# Patient Record
Sex: Male | Born: 1950 | Race: White | Hispanic: No | Marital: Married | Smoking: Never smoker
Health system: Southern US, Community
[De-identification: ages and names within clinical notes are randomized; demographics above are authoritative.]

## PROBLEM LIST (undated history)

## (undated) DIAGNOSIS — C801 Malignant (primary) neoplasm, unspecified: Secondary | ICD-10-CM

## (undated) DIAGNOSIS — H269 Unspecified cataract: Secondary | ICD-10-CM

## (undated) DIAGNOSIS — G709 Myoneural disorder, unspecified: Secondary | ICD-10-CM

## (undated) DIAGNOSIS — M1990 Unspecified osteoarthritis, unspecified site: Secondary | ICD-10-CM

## (undated) DIAGNOSIS — J189 Pneumonia, unspecified organism: Secondary | ICD-10-CM

## (undated) DIAGNOSIS — Z973 Presence of spectacles and contact lenses: Secondary | ICD-10-CM

## (undated) DIAGNOSIS — G4730 Sleep apnea, unspecified: Secondary | ICD-10-CM

## (undated) DIAGNOSIS — E538 Deficiency of other specified B group vitamins: Secondary | ICD-10-CM

## (undated) DIAGNOSIS — E039 Hypothyroidism, unspecified: Secondary | ICD-10-CM

## (undated) DIAGNOSIS — Z87820 Personal history of traumatic brain injury: Secondary | ICD-10-CM

## (undated) DIAGNOSIS — K5909 Other constipation: Secondary | ICD-10-CM

## (undated) DIAGNOSIS — E559 Vitamin D deficiency, unspecified: Secondary | ICD-10-CM

## (undated) DIAGNOSIS — G20A1 Parkinson's disease without dyskinesia, without mention of fluctuations: Secondary | ICD-10-CM

## (undated) DIAGNOSIS — R269 Unspecified abnormalities of gait and mobility: Secondary | ICD-10-CM

## (undated) DIAGNOSIS — T7840XA Allergy, unspecified, initial encounter: Secondary | ICD-10-CM

## (undated) DIAGNOSIS — K219 Gastro-esophageal reflux disease without esophagitis: Secondary | ICD-10-CM

## (undated) DIAGNOSIS — J342 Deviated nasal septum: Secondary | ICD-10-CM

## (undated) DIAGNOSIS — I10 Essential (primary) hypertension: Secondary | ICD-10-CM

## (undated) DIAGNOSIS — G4733 Obstructive sleep apnea (adult) (pediatric): Secondary | ICD-10-CM

## (undated) DIAGNOSIS — M25512 Pain in left shoulder: Secondary | ICD-10-CM

## (undated) DIAGNOSIS — J309 Allergic rhinitis, unspecified: Secondary | ICD-10-CM

## (undated) DIAGNOSIS — G20 Parkinson's disease: Secondary | ICD-10-CM

## (undated) HISTORY — PX: OTHER SURGICAL HISTORY: SHX169

## (undated) HISTORY — PX: POSTERIOR FUSION CERVICAL SPINE: SUR628

## (undated) HISTORY — PX: POSTERIOR LAMINECTOMY / DECOMPRESSION LUMBAR SPINE: SUR740

## (undated) HISTORY — PX: TOTAL HIP ARTHROPLASTY: SHX124

## (undated) HISTORY — PX: LAPAROSCOPIC CHOLECYSTECTOMY: SUR755

## (undated) HISTORY — DX: Unspecified cataract: H26.9

## (undated) HISTORY — DX: Myoneural disorder, unspecified: G70.9

## (undated) HISTORY — DX: Allergy, unspecified, initial encounter: T78.40XA

## (undated) HISTORY — PX: CHOLECYSTECTOMY: SHX55

## (undated) HISTORY — PX: SPINE SURGERY: SHX786

## (undated) HISTORY — PX: JOINT REPLACEMENT: SHX530

## (undated) HISTORY — PX: POSTERIOR CERVICAL FUSION/FORAMINOTOMY: SHX5038

## (undated) HISTORY — PX: APPENDECTOMY: SHX54

## (undated) HISTORY — DX: Sleep apnea, unspecified: G47.30

---
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
## 2017-09-18 HISTORY — PX: TOTAL HIP ARTHROPLASTY: SHX124

## 2018-05-23 ENCOUNTER — Other Ambulatory Visit: Payer: Self-pay

## 2018-05-23 ENCOUNTER — Emergency Department (HOSPITAL_COMMUNITY): Payer: Medicare Other

## 2018-05-23 ENCOUNTER — Emergency Department (HOSPITAL_COMMUNITY)
Admission: EM | Admit: 2018-05-23 | Discharge: 2018-05-23 | Disposition: A | Discharge status: Home / Self Care | Payer: Medicare Other | Attending: Emergency Medicine | Admitting: Emergency Medicine

## 2018-05-23 ENCOUNTER — Encounter (HOSPITAL_COMMUNITY): Payer: Self-pay

## 2018-05-23 DIAGNOSIS — S73005A Unspecified dislocation of left hip, initial encounter: Secondary | ICD-10-CM | POA: Diagnosis not present

## 2018-05-23 DIAGNOSIS — X500XXA Overexertion from strenuous movement or load, initial encounter: Secondary | ICD-10-CM | POA: Insufficient documentation

## 2018-05-23 DIAGNOSIS — Y9389 Activity, other specified: Secondary | ICD-10-CM | POA: Insufficient documentation

## 2018-05-23 DIAGNOSIS — Y92009 Unspecified place in unspecified non-institutional (private) residence as the place of occurrence of the external cause: Secondary | ICD-10-CM | POA: Insufficient documentation

## 2018-05-23 DIAGNOSIS — I10 Essential (primary) hypertension: Secondary | ICD-10-CM | POA: Diagnosis not present

## 2018-05-23 DIAGNOSIS — M79605 Pain in left leg: Secondary | ICD-10-CM | POA: Diagnosis not present

## 2018-05-23 DIAGNOSIS — S79912A Unspecified injury of left hip, initial encounter: Secondary | ICD-10-CM | POA: Diagnosis present

## 2018-05-23 DIAGNOSIS — Z9889 Other specified postprocedural states: Secondary | ICD-10-CM

## 2018-05-23 DIAGNOSIS — Y998 Other external cause status: Secondary | ICD-10-CM | POA: Insufficient documentation

## 2018-05-23 DIAGNOSIS — Z96642 Presence of left artificial hip joint: Secondary | ICD-10-CM | POA: Diagnosis not present

## 2018-05-23 LAB — CBC WITH DIFFERENTIAL/PLATELET
Abs Immature Granulocytes: 0.03 10*3/uL (ref 0.00–0.07)
Basophils Absolute: 0 10*3/uL (ref 0.0–0.1)
Basophils Relative: 1 %
Eosinophils Absolute: 0.2 10*3/uL (ref 0.0–0.5)
Eosinophils Relative: 4 %
HCT: 35.3 % — ABNORMAL LOW (ref 39.0–52.0)
Hemoglobin: 11.4 g/dL — ABNORMAL LOW (ref 13.0–17.0)
Immature Granulocytes: 1 %
Lymphocytes Relative: 20 %
Lymphs Abs: 1.1 10*3/uL (ref 0.7–4.0)
MCH: 29.8 pg (ref 26.0–34.0)
MCHC: 32.3 g/dL (ref 30.0–36.0)
MCV: 92.2 fL (ref 80.0–100.0)
Monocytes Absolute: 0.5 10*3/uL (ref 0.1–1.0)
Monocytes Relative: 9 %
Neutro Abs: 3.8 10*3/uL (ref 1.7–7.7)
Neutrophils Relative %: 65 %
Platelets: 119 10*3/uL — ABNORMAL LOW (ref 150–400)
RBC: 3.83 MIL/uL — ABNORMAL LOW (ref 4.22–5.81)
RDW: 12.9 % (ref 11.5–15.5)
WBC: 5.7 10*3/uL (ref 4.0–10.5)
nRBC: 0 % (ref 0.0–0.2)

## 2018-05-23 LAB — BASIC METABOLIC PANEL
Anion gap: 6 (ref 5–15)
BUN: 11 mg/dL (ref 8–23)
CO2: 24 mmol/L (ref 22–32)
Calcium: 7.5 mg/dL — ABNORMAL LOW (ref 8.9–10.3)
Chloride: 111 mmol/L (ref 98–111)
Creatinine, Ser: 0.66 mg/dL (ref 0.61–1.24)
GFR calc Af Amer: >60 mL/min (ref >60)
GFR calc non Af Amer: >60 mL/min (ref >60)
Glucose, Bld: 87 mg/dL (ref 70–99)
Potassium: 3 mmol/L — ABNORMAL LOW (ref 3.5–5.1)
Sodium: 141 mmol/L (ref 135–145)

## 2018-05-23 LAB — I-STAT CHEM 8, ED
BUN: 8 mg/dL (ref 8–23)
Calcium, Ion: 0.96 mmol/L — ABNORMAL LOW (ref 1.15–1.40)
Chloride: 110 mmol/L (ref 98–111)
Creatinine, Ser: 0.6 mg/dL — ABNORMAL LOW (ref 0.61–1.24)
Glucose, Bld: 77 mg/dL (ref 70–99)
HCT: 30 % — ABNORMAL LOW (ref 39.0–52.0)
Hemoglobin: 10.2 g/dL — ABNORMAL LOW (ref 13.0–17.0)
Potassium: 2.8 mmol/L — ABNORMAL LOW (ref 3.5–5.1)
Sodium: 141 mmol/L (ref 135–145)
TCO2: 23 mmol/L (ref 22–32)

## 2018-05-23 LAB — PROTIME-INR
INR: 0.94
Prothrombin Time: 12.5 seconds (ref 11.4–15.2)

## 2018-05-23 MED ORDER — TRAMADOL HCL 50 MG PO TABS: 50.0000 mg | ORAL_TABLET | Freq: Four times a day (QID) | ORAL | 0 refills | Status: DC | PRN

## 2018-05-23 MED ORDER — FENTANYL CITRATE (PF) 100 MCG/2ML IJ SOLN
50.0000 ug | INTRAMUSCULAR | Status: AC | PRN
  Administered 2018-05-23 (×2): 50 ug via INTRAVENOUS
  Filled 2018-05-23 (×2): qty 2

## 2018-05-23 MED ORDER — ONDANSETRON HCL 4 MG/2ML IJ SOLN
4.0000 mg | Freq: Once | INTRAMUSCULAR | Status: AC
  Administered 2018-05-23: 4 mg via INTRAVENOUS
  Filled 2018-05-23: qty 2

## 2018-05-23 MED ORDER — PROPOFOL 10 MG/ML IV BOLUS
0.5000 mg/kg | Freq: Once | INTRAVENOUS | Status: AC
  Administered 2018-05-23: 51.1 mg via INTRAVENOUS
  Filled 2018-05-23: qty 20

## 2018-05-23 NOTE — ED Provider Notes (Signed)
.  Sedation Date/Time: 05/23/2018 11:29 PM Performed by: Deno Etienne, DO Authorized by: Deno Etienne, DO   Consent:    Consent obtained:  Verbal   Consent given by:  Patient and spouse   Risks discussed:  Allergic reaction, inadequate sedation, nausea, vomiting, respiratory compromise necessitating ventilatory assistance and intubation and prolonged hypoxia resulting in organ damage   Alternatives discussed:  Analgesia without sedation, anxiolysis and regional anesthesia Universal protocol:    Procedure explained and questions answered to patient or proxy's satisfaction: yes     Immediately prior to procedure a time out was called: yes     Patient identity confirmation method:  Arm band and verbally with patient Indications:    Procedure performed:  Dislocation reduction   Procedure necessitating sedation performed by:  Different physician Pre-sedation assessment:    Time since last food or drink:  6   ASA classification: class 2 - patient with mild systemic disease     Neck mobility: reduced (prior spinal fusion)     Mouth opening:  3 or more finger widths   Thyromental distance:  2 finger widths   Mallampati score:  II - soft palate, uvula, fauces visible   Pre-sedation assessments completed and reviewed: airway patency, cardiovascular function, hydration status, mental status, nausea/vomiting, pain level, respiratory function and temperature   Immediate pre-procedure details:    Reviewed: vital signs, relevant labs/tests and NPO status     Verified: bag valve mask available, emergency equipment available, intubation equipment available, IV patency confirmed, oxygen available and suction available   Procedure details (see MAR for exact dosages):    Preoxygenation:  Nasal cannula   Sedation:  Propofol   Analgesia:  Fentanyl   Intra-procedure monitoring:  Blood pressure monitoring, cardiac monitor, continuous capnometry, continuous pulse oximetry, frequent LOC assessments and frequent  vital sign checks   Intra-procedure events: none     Total Provider sedation time (minutes):  35 Post-procedure details:    Post-sedation assessments completed and reviewed: airway patency, cardiovascular function, hydration status, mental status, nausea/vomiting, pain level, respiratory function and temperature     Patient is stable for discharge or admission: yes     Patient tolerance:  Tolerated well, no immediate complications   Medical screening examination/treatment/procedure(s) were conducted as a shared visit with non-physician practitioner(s) and myself.  I personally evaluated the patient during the encounter.  None   See the written copy of this report in the patient's paper medical record.  These results did not interface directly into the electronic medical record and are summarized here.   67 yo M with a chief complaint of a left hip dislocation.  He was squatting and fell out of place.  Plain films viewed by me with superior dislocation.  This was reduced with conscious sedation.  My exam initially with deformity to the left hip pulse motor and sensation intact distally.  Placed in a knee immobilizer.  Follow-up with orthopedics.    Deno Etienne, DO 05/23/18 2332

## 2018-05-23 NOTE — ED Notes (Addendum)
Procedural sedation: Dr Tyrone Nine, PA Vernie Shanks, Ortho tech [place], RT [place]ang, RN Vue [place] time out 1958 propofol 50mg   Given by Dr Tyrone Nine 1959 propofol 20mg   Given by Dr Tyrone Nine 2000 propofol 10mg  Given by Dr Tyrone Nine 2000 propofol 30mg  Given by Dr Tyrone Nine 2004 propofol 20mg  Given by Dr Tyrone Nine 2005 sedation finished. 2015 pt is awake, alert and oriented x 4, VSS.

## 2018-05-23 NOTE — ED Provider Notes (Signed)
[facility] DEPT Provider Note   CSN: [id number] Arrival date & time: 05/23/18  1614     History   Chief Complaint Chief Complaint  Patient presents with  . Hip Injury    HPI Jim Carson is a 67 y.o. male with a past medical history of previous total hip arthroplasty on the left and hypertension.  He arrives with hip injury.  Patient states that he was in his garage squatting down to look for Christmas decorations when he felt like there was severe of ear pulling on the muscles of his left hip and then he felt like his hip just "slid right out."  His surgery was done in the past at [facility] in [place].  He was unable to move or ambulate after the episode today.  He has no history of previous dislocations of the prosthesis or injuries.  He denies any numbness or tingling of the foot  HPI  History reviewed. No pertinent past medical history.  There are no active problems to display for this patient.   Past Surgical History:  Procedure Laterality Date  . TOTAL HIP ARTHROPLASTY Left 09/18/2017        Home Medications    Prior to Admission medications   Not on File    Family History History reviewed. No pertinent family history.  Social History Social History   Tobacco Use  . Smoking status: Never Smoker  . Smokeless tobacco: Never Used  Substance Use Topics  . Alcohol use: Not on file  . Drug use: Not on file     Allergies   Patient has no allergy information on record.   Review of Systems Review of Systems Ten systems reviewed and are negative for acute change, except as noted in the HPI.    Physical Exam Updated Vital Signs BP 123/65   Pulse (!) 58   Temp 98.1 F (36.7 C) (Oral)   Resp 16   Ht 5\' 8"  (1.727 m)   Wt 102.1 kg   SpO2 98%   BMI 34.21 kg/m   Physical Exam  Constitutional: He appears well-developed and well-nourished. No distress.  HENT:  Head: Normocephalic and atraumatic.  Eyes: Conjunctivae are  normal. No scleral icterus.  Neck: Normal range of motion. Neck supple.  Cardiovascular: Normal rate, regular rhythm and normal heart sounds.  Pulmonary/Chest: Effort normal and breath sounds normal. No respiratory distress.  Abdominal: Soft. There is no tenderness.  Musculoskeletal: He exhibits deformity.  Obvious right-sided hip deformity.  Hip appears wide.  There is mild internal rotation at the ankle with a small amount of shortening of the limb.  Neurological: He is alert.  Skin: Skin is warm and dry. He is not diaphoretic.  Psychiatric: His behavior is normal.  Nursing note and vitals reviewed.    ED Treatments / Results  Labs (all labs ordered are listed, but only abnormal results are displayed) Labs Reviewed  BASIC METABOLIC PANEL  CBC WITH DIFFERENTIAL/PLATELET  PROTIME-INR  I-STAT CHEM 8, ED    EKG None  Radiology No results found.  Procedures Reduction of dislocation Date/Time: 05/23/2018 11:17 PM Performed by: Margarita Mail, PA-C Authorized by: Margarita Mail, PA-C  Consent: Verbal consent obtained. Consent given by: patient Patient identity confirmed: verbally with patient and provided demographic data Time out: Immediately prior to procedure a "time out" was called to verify the correct patient, procedure, equipment, support staff and site/side marked as required.  Sedation: Patient sedated: yes (see Dr. Miachel Roux note) Vitals: Vital  signs were monitored during sedation.  Patient tolerance: Patient tolerated the procedure well with no immediate complications    (including critical care time)  Medications Ordered in ED Medications  fentaNYL (SUBLIMAZE) injection 50 mcg (has no administration in time range)  ondansetron (ZOFRAN) injection 4 mg (has no administration in time range)     Initial Impression / Assessment and Plan / ED Course  I have reviewed the triage vital signs and the nursing notes.  Pertinent labs & imaging results that were  available during my care of the patient were reviewed by me and considered in my medical decision making (see chart for details).  Clinical Course as of May 23 1826  Sat May 23, 2018  1748 Potassium(!): 2.8 [AH]    Clinical Course User Index [AH] Margarita Mail, PA-C    Patient with prosthetic hip dislocation.  Procedural sedation provided by Dr. Tyrone Nine. Patient had successful reduction of the dislocation.  He has a walker at home and patient was placed in a knee immobilizer.  He had his surgery done in [facility] and is advised to follow with that surgeon however I gave him the number for the doctor who is currently on-call for [place] [facility].  Patient appears appropriate for discharge at this time.  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

## 2018-05-23 NOTE — ED Notes (Signed)
Bed: WA04 Expected date:  Expected time:  Means of arrival:  Comments: Hip injury

## 2018-05-23 NOTE — Progress Notes (Signed)
RT present in room during conscious sedation.  Pt tolerated well on 2lnc.  Vitals stable, wnl during procedure.

## 2018-05-23 NOTE — Discharge Instructions (Addendum)
Please keep the knee immobilizer on at all times until you follow up with the orthpedic surgeon and are instructed to discontinue using it.   Follow up with your orthopedic surgeon. Contact a health care provider if: You have pain that gets worse or does not get better with medicine. You have swelling or red skin on your hip area or your leg. You cannot move any part of your hip or leg. You have a tingling sensation in any part of your hip, leg, or foot. Get help right away if: You feel like your hip has dislocated again. You have severe pain in your hip or groin. You have numbness or weakness in your leg.

## 2018-08-27 ENCOUNTER — Encounter: Payer: Self-pay | Admitting: Physical Therapy

## 2018-08-27 ENCOUNTER — Ambulatory Visit: Payer: Medicare Other | Attending: Neurology | Admitting: Physical Therapy

## 2018-08-27 DIAGNOSIS — R2689 Other abnormalities of gait and mobility: Secondary | ICD-10-CM | POA: Diagnosis not present

## 2018-08-27 DIAGNOSIS — R2681 Unsteadiness on feet: Secondary | ICD-10-CM | POA: Insufficient documentation

## 2018-08-27 DIAGNOSIS — R293 Abnormal posture: Secondary | ICD-10-CM | POA: Insufficient documentation

## 2018-08-27 DIAGNOSIS — M6281 Muscle weakness (generalized): Secondary | ICD-10-CM | POA: Diagnosis present

## 2018-08-27 NOTE — Therapy (Signed)
[facility] [address] [place], [place], [address] Phone: [phone]   Fax:  [phone]  Physical Therapy Evaluation  Patient Details  Name: Jim Carson MRN: [id number] Date of Birth: 03/30/1951 Referring Provider (PT): Jennelle Human, MD   Encounter Date: 08/27/2018  PT End of Session - 08/27/18 1307    Visit Number  1    Number of Visits  13    Date for PT Re-Evaluation  10/26/18    Authorization Type  Medicare and [employer] (covered 100%)-will need 10th visit progress notes    PT Start Time  0934    PT Stop Time  1038    PT Time Calculation (min)  64 min    Equipment Utilized During Treatment  Gait belt    Activity Tolerance  Patient tolerated treatment well    Behavior During Therapy  [facility] for tasks assessed/performed       History reviewed. No pertinent past medical history.  Past Surgical History:  Procedure Laterality Date  . 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   Subjective Assessment - 08/27/18 0937    Subjective  Saw Dr. Nicki Reaper and he recommended that I get some therapy.  My balance this past year has not been as good.  Was golfing, doing TaiChi and [employer], but then moved and had hip replacement.  Have had at least 1 fall in the past 6 months.  Uses cane.    Pertinent History  08/2017 L THR; 05/23/18 L hip dislocation (was put back in without surgery), L RTC tear    Patient Stated Goals  Pt's goal for therapy is to improve balance and get back to where I was one year ago.     Currently in Pain?  Yes    Pain Score  1     Pain Location  Shoulder    Pain Orientation  Left    Pain Descriptors / Indicators  Aching    Pain Type  Chronic pain    Pain Onset  More than a month ago    Pain Frequency  Intermittent    Aggravating Factors   driving car, holding onto treadmill    Pain Relieving Factors  resting position         [facility] PT  Assessment - 08/27/18 0944      Assessment   Medical Diagnosis  Parkinson's disease    Referring Provider (PT)  Jennelle Human, MD    Onset Date/Surgical Date  --   08/2017 L hip surgery   Hand Dominance  Left    Next MD Visit  to [place] 08/2017      Precautions   Precautions  Fall      Balance Screen   Has the patient fallen in the past 6 months  Yes    How many times?  2    Has the patient had a decrease in activity level because of a fear of falling?   No    Is the patient reluctant to leave their home because of a fear of falling?   No      Home Environment   Living Environment  Private residence    Living Arrangements  Spouse/significant other    Available Help at Discharge  Family    Type of [place] Access  Level entry    [place]  Two level;Able to live on  main level with bedroom/bathroom   Man cave over garage   Alternate Level Stairs-Number of Steps  12    Alternate Level Stairs-Rails  [place] - 2 wheels;[place] - single point      Prior Function   Level of Independence  Independent    Leisure  Enjoys golfing, [place], [employer] (has not really done since moving from [place]); plays guitar      Observation/Other Assessments   Focus on Therapeutic Outcomes (FOTO)   NA      ROM / Strength   AROM / PROM / Strength  Strength      Strength   Overall Strength  Deficits    Overall Strength Comments  Grossly tested RLE 5/5 except 4/5 R ankle dorsiflexion; LLE 4+/5 hip flexion, quads; hamstrings and ankle dorsiflexion 5/5      Transfers   Transfers  Sit to Stand;Stand to Sit    Sit to Stand  5: Supervision;Without upper extremity assist;From chair/3-in-1    Five time sit to stand comments   13.57    Stand to Sit  5: Supervision;Without upper extremity assist;To chair/3-in-1      Ambulation/Gait   Ambulation/Gait  Yes    Gait velocity  2.2 ft/sec      Standardized Balance Assessment   Standardized Balance Assessment   Timed Up and Go Test      Timed Up and Go Test   Normal TUG (seconds)  17.16    Manual TUG (seconds)  18.75    Cognitive TUG (seconds)  18.25    TUG Comments  Scores >13.5-15 seconds indicate increased fall risk.      High Level Balance   High Level Balance Comments  MiniBESTest score:  12/28  (Scores <22/28 indicate increased fall risk)        Mini-BESTest: Balance Evaluation Systems Test  2005-2013 [place]. All rights reserved. ________________________________________________________________________________________Anticipatory_________Subscore___3__/6 1. SIT TO STAND Instruction: "Cross your arms across your chest. Try not to use your hands unless you must.Do not let your legs lean against the back of the chair when you stand. Please stand up now." X(2) Normal: Comes to stand without use of hands and stabilizes independently. (1) Moderate: Comes to stand WITH use of hands on first attempt. (0) Severe: Unable to stand up from chair without assistance, OR needs several attempts with use of hands. 2. RISE TO TOES Instruction: "Place your feet shoulder width apart. Place your hands on your hips. Try to rise as high as you can onto your toes. I will count out loud to 3 seconds. Try to hold this pose for at least 3 seconds. Look straight ahead. Rise now." (2) Normal: Stable for 3 s with maximum height. (1) Moderate: Heels up, but not full range (smaller than when holding hands), OR noticeable instability for 3 s. X(0) Severe: < 3 s. 3. STAND ON ONE LEG Instruction: "Look straight ahead. Keep your hands on your hips. Lift your leg off of the ground behind you without touching or resting your raised leg upon your other standing leg. Stay standing on one leg as long as you can. Look straight ahead. Lift now." Left: Time in Seconds Trial 1:__17.06___Trial 2:__20___ (2) Normal: 20 s. X(1) Moderate: < 20 s. (0) Severe: Unable. Right: Time in Seconds Trial  1:__1.53__Trial 2:__1.97___ (2) Normal: 20 s. X(1) Moderate: < 20 s. (0) Severe: Unable To score each side separately use the trial with the longest time.  To calculate the sub-score and total score use the side [left or right] with the lowest numerical score [i.e. the worse side]. ______________________________________________________________________________________Reactive Postural Control___________Subscore:____2_/6 4. COMPENSATORY STEPPING CORRECTION- FORWARD Instruction: "Stand with your feet shoulder width apart, arms at your sides. Lean forward against my hands beyond your forward limits. When I let go, do whatever is necessary, including taking a step, to avoid a fall." X(2) Normal: Recovers independently with a single, large step (second realignment step is allowed). (1) Moderate: More than one step used to recover equilibrium. (0) Severe: No step, OR would fall if not caught, OR falls spontaneously. 5. COMPENSATORY STEPPING CORRECTION- BACKWARD Instruction: "Stand with your feet shoulder width apart, arms at your sides. Lean backward against my hands beyond your backward limits. When I let go, do whatever is necessary, including taking a step, to avoid a fall." (2) Normal: Recovers independently with a single, large step. (1) Moderate: More than one step used to recover equilibrium. X(0) Severe: No step, OR would fall if not caught, OR falls spontaneously.-RETROPUSION 6. COMPENSATORY STEPPING CORRECTION- LATERAL Instruction: "Stand with your feet together, arms down at your sides. Lean into my hand beyond your sideways limit. When I let go, do whatever is necessary, including taking a step, to avoid a fall." Left (2) Normal: Recovers independently with 1 step (crossover or lateral OK). (1) Moderate: Several steps to recover equilibrium. X(0) Severe: Falls, or cannot step. Right (2) Normal: Recovers independently with 1 step (crossover or lateral OK). (1) Moderate: Several steps  to recover equilibrium. X(0) Severe: Falls, or cannot step. Use the side with the lowest score to calculate sub-score and total score. ____________________________________________________________________________________Sensory Orientation_____________Subscore:______2___/6 7. STANCE (FEET TOGETHER); EYES OPEN, FIRM SURFACE Instruction: "Place your hands on your hips. Place your feet together until almost touching. Look straight ahead. Be as stable and still as possible, until I say stop." Time in seconds:________ X(2) Normal: 30 s. (1) Moderate: < 30 s. (0) Severe: Unable. 8. STANCE (FEET TOGETHER); EYES CLOSED, FOAM SURFACE Instruction: "Step onto the foam. Place your hands on your hips. Place your feet together until almost touching. Be as stable and still as possible, until I say stop. I will start timing when you close your eyes." Time in seconds:________ (2) Normal: 30 s. (1) Moderate: < 30 s. X(0) Severe: Unable. 9. INCLINE- EYES CLOSED Instruction: "Step onto the incline ramp. Please stand on the incline ramp with your toes toward the top. Place your feet shoulder width apart and have your arms down at your sides. I will start timing when you close your eyes." Time in seconds:________ (2) Normal: Stands independently 30 s and aligns with gravity. (1) Moderate: Stands independently <30 s OR aligns with surface. X(0) Severe: Unable. _________________________________________________________________________________________Dynamic Gait ______Subscore______5__/10 10. CHANGE IN GAIT SPEED Instruction: "Begin walking at your normal speed, when I tell you 'fast', walk as fast as you can. When I say 'slow', walk very slowly." (2) Normal: Significantly changes walking speed without imbalance. X(1) Moderate: Unable to change walking speed or signs of imbalance. (0) Severe: Unable to achieve significant change in walking speed AND signs of imbalance. [place] -  HORIZONTAL Instruction: "Begin walking at your normal speed, when I say "right", turn your head and look to the right. When I say "left" turn your head and look to the left. Try to keep yourself walking in a straight line." (2) Normal: performs head turns with no change in gait speed and good balance. X(1) Moderate: performs head turns  with reduction in gait speed. (0) Severe: performs head turns with imbalance. 12. WALK WITH PIVOT TURNS Instruction: "Begin walking at your normal speed. When I tell you to 'turn and stop', turn as quickly as you can, face the opposite direction, and stop. After the turn, your feet should be close together." (2) Normal: Turns with feet close FAST (< 3 steps) with good balance. (1) Moderate: Turns with feet close SLOW (>4 steps) with good balance. X(0) Severe: Cannot turn with feet close at any speed without imbalance. 13. STEP OVER OBSTACLES Instruction: "Begin walking at your normal speed. When you get to the box, step over it, not around it and keep walking." (2) Normal: Able to step over box with minimal change of gait speed and with good balance. X(1) Moderate: Steps over box but touches box OR displays cautious behavior by slowing gait. (0) Severe: Unable to step over box OR steps around box. 14. TIMED UP & GO WITH DUAL TASK [3 METER WALK] Instruction TUG: "When I say 'Go', stand up from chair, walk at your normal speed across the tape on the floor, turn around, and come back to sit in the chair." Instruction TUG with Dual Task: "Count backwards by threes starting at ___. When I say 'Go', stand up from chair, walk at your normal speed across the tape on the floor, turn around, and come back to sit in the chair. Continue counting backwards the entire time." TUG: ________seconds; Dual Task TUG: ________seconds X(2) Normal: No noticeable change in sitting, standing or walking while backward counting when compared to TUG without Dual Task. (1) Moderate: Dual  Task affects either counting OR walking (>10%) when compared to the TUG without Dual Task. (0) Severe: Stops counting while walking OR stops walking while counting. When scoring item 14, if subject's gait speed slows more than 10% between the TUG without and with a Dual Task the score should be decreased by a point. TOTAL SCORE: ____12____/28          Objective measurements completed on examination: See above findings.      [facility] Adult PT Treatment/Exercise - 08/27/18 0944      Ambulation/Gait   Ambulation Distance (Feet)  200 Feet    Assistive device  Straight cane    Gait Pattern  Step-through pattern;Decreased arm swing - left;Decreased step length - left;Decreased step length - right;Decreased dorsiflexion - right;Decreased dorsiflexion - left;Right foot flat;Left foot flat;Trendelenburg   foot slap   Ambulation Surface  Level;Indoor      Posture/Postural Control   Posture/Postural Control  Postural limitations    Postural Limitations  Forward head;Rounded Shoulders        HEP initiated:  Stagger stance forward/back weightshifting, lateral wide BOS weightshifting x 10 reps each to prevent posterior lean. -long sitting ankle pumps for plantar flexion and dorsiflexion x 10 reps (pt has band at home and may use) -short sitting ankle pumps plantar flexion/dorsiflexion 5 reps     PT Education - 08/27/18 1305    Education Details  Fall risk per TUG, MiniBESTest measures; initiated HEP to address static standing to avoid retropulsion, ankle plantarflexion strengthening    Person(s) Educated  Patient    Methods  Explanation;Demonstration;Handout    Comprehension  Verbalized understanding;Returned demonstration          PT Long Term Goals - 08/27/18 1502      PT LONG TERM GOAL #1   Title  Pt will be independent with HEP for improved strength, balance, gait.  TARGET 09/25/2018    Time  4    Period  Weeks    Status  New    Target Date  09/25/18      PT LONG TERM  GOAL #2   Title  Pt will improve 5x sit<>stand to less than or equal to 12.5 seconds with no posterior lean for improved transfer efficiency and safety.    Time  4    Period  Weeks    Status  New    Target Date  09/25/18      PT LONG TERM GOAL #3   Title  Pt will improve TUG score to less than or equal to 13.5 seconds for decreased fall risk.    Time  4    Period  Weeks    Status  New    Target Date  09/25/18      PT LONG TERM GOAL #4   Title  Pt will improve MiniBESTest score to at least 16/28 for decreased fall risk.    Time  4    Period  Weeks    Status  New    Target Date  09/25/18      PT LONG TERM GOAL #5   Title  Pt will verbalize understanding of fall prevention in home environment.    Time  4    Period  Weeks    Status  New    Target Date  09/25/18      Additional Long Term Goals   Additional Long Term Goals  Yes      PT LONG TERM GOAL #6   Title  Pt will verbalize understanding of local Parkinson's disease-related resources.    Time  4    Period  Weeks    Status  New    Target Date  09/25/18             Plan - 08/27/18 1308    Clinical Impression Statement  Pt is a 68 year old male who presents to [facility] with history of Parkinson's disease, L THR March 2019 (and hip dislocation 05/2018-not requiring additional surgery).  He has had 2 falls in past 6 months, presents with decreased lower extremity strength, decreased balance, abnormal posture, postural instability, decreased timing and coordination with gait, retropulsion with standing.  Pt is at fall risk per TUG and MiniBESTest scores.  He enjoys playing golf and [place], which he has had to stop doing.  He will benefit from skilled PT to address the above stated deficits to decrease fall risk and improve funcitonal mobility and return to recreational activities.    History and Personal Factors relevant to plan of care:  PMH >3 co-morbidities, hx of 2 falls in past 6 months, THR < 1 year ago, upcoming L RTC  surgery; recent move-no longer participating in community exercise    Clinical Presentation  Evolving    Clinical Presentation due to:  Hx 2 falls, fall risk per TUG and MiniBEST test scores     Clinical Decision Making  Moderate    Rehab Potential  Good    PT Frequency  3x / week    PT Duration  4 weeks   plus eval (PT prefers long POC, but pt scheduled for RTC surgery 09/29/2018)   PT Treatment/Interventions  ADLs/Self Care Home Management;Therapeutic exercise;Therapeutic activities;Functional mobility training;Gait training;DME Instruction;Balance training;Neuromuscular re-education;Patient/family education;Orthotic Fit/Training    PT Next Visit Plan  Review HEP given eval visit; hip, ankle strategy, step strategy work; simulate activities  with sling (following shoulder surgery); ask about OT    Consulted and Agree with Plan of Care  Patient       Patient will benefit from skilled therapeutic intervention in order to improve the following deficits and impairments:  Abnormal gait, Decreased balance, Decreased knowledge of precautions, Decreased safety awareness, Decreased mobility, Decreased strength, Difficulty walking, Postural dysfunction  Visit Diagnosis: Other abnormalities of gait and mobility  Unsteadiness on feet  Muscle weakness (generalized)  Abnormal posture     Problem List There are no active problems to display for this patient.   Frazier Butt 08/27/2018, 3:22 PM  Frazier Butt., PT   [facility] [address] [place], [place], [address] Phone: [phone]   Fax:  [phone]  Name: Faysal Fenoglio MRN: [id number] Date of Birth: 1951-03-24

## 2018-08-27 NOTE — Patient Instructions (Addendum)
Weight Shift: Diagonal    Slowly shift weight forward over right leg. Return to starting position. Shift backward over opposite leg. Hold each position _3___ seconds. Repeat __10__ times per session. Do __2-3__ sessions per day.   Copyright  [employer]. All rights reserved.  Ankle Bend: Dorsiflexion / Plantar Flexion, Sitting    Sit with feet on floor. Point toes up, keeping both heels on floor. Then press toes to floor raising heels. Hold each position _3__ seconds. Repeat __2 sets of 10_ times per session. Do _2__ sessions per day.  Copyright  VHI. All rights reserved.  Ankle Plantar Flexion: Long-Sitting (Single Leg)    Loop tubing around foot of straight leg, anchor with one hand. Leg straight, point toes downward. Repeat _10_ times per set. Repeat with other leg. Do _2_ sets per session. Do _1-2_ sessions per day.  [email or web address].exer.us/215   Copyright  VHI. All rights reserved.

## 2018-09-04 ENCOUNTER — Ambulatory Visit: Payer: Medicare Other | Attending: Neurology | Admitting: Physical Therapy

## 2018-09-04 ENCOUNTER — Encounter: Payer: Self-pay | Admitting: Physical Therapy

## 2018-09-04 DIAGNOSIS — M6281 Muscle weakness (generalized): Secondary | ICD-10-CM | POA: Insufficient documentation

## 2018-09-04 DIAGNOSIS — R2681 Unsteadiness on feet: Secondary | ICD-10-CM

## 2018-09-04 DIAGNOSIS — R293 Abnormal posture: Secondary | ICD-10-CM | POA: Diagnosis present

## 2018-09-04 DIAGNOSIS — R2689 Other abnormalities of gait and mobility: Secondary | ICD-10-CM | POA: Diagnosis present

## 2018-09-05 NOTE — Therapy (Signed)
[facility] [address] [place], [place], [address] Phone: [phone]   Fax:  [phone]  Physical Therapy Treatment  Patient Details  Name: Jim [facility] MRN: [id number] Date of Birth: 08/11/1950 Referring Provider (PT): Jennelle Human, MD   Encounter Date: 09/04/2018  PT End of Session - 09/05/18 0706    Visit Number  2    Number of Visits  13    Date for PT Re-Evaluation  10/26/18    Authorization Type  Medicare and [place] (covered 100%)-will need 10th visit progress notes    PT Start Time  0802    PT Stop Time  0846    PT Time Calculation (min)  44 min    Equipment Utilized During Treatment  Gait belt    Activity Tolerance  Patient tolerated treatment well    Behavior During Therapy  [facility] for tasks assessed/performed       History reviewed. No pertinent past medical history.  Past Surgical History:  Procedure Laterality Date  . 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  Subjective Assessment - 09/04/18 0805    Subjective  No changes, no falls.  I walked out of the house without (forgot) my cane this morning.    Pertinent History  08/2017 L THR; 05/23/18 L hip dislocation (was put back in without surgery), L RTC tear    Patient Stated Goals  Pt's goal for therapy is to improve balance and get back to where I was one year ago.     Currently in Pain?  Yes    Pain Score  2     Pain Location  Shoulder    Pain Orientation  Left    Pain Descriptors / Indicators  Aching    Pain Type  Chronic pain    Pain Onset  More than a month ago    Pain Frequency  Intermittent    Aggravating Factors   driving car, holding onto treadmill, worse earlier this morning    Pain Relieving Factors  resting, being in the recliner                          PWR [facility]) - 09/05/18 0654    PWR! exercises  Moves in standing    PWR! Up  x 10    PWR! Rock  x 10 reps each  side    PWR! Twist  x 10 each side(modified reach across body to touch cabinet)    PWR Step  x 10 each side    Comments  Performed at counter for UE support; PT min guard/close supervision and cues for correct technique and for appropriate intensity.  Explained rationale for PWR! Moves and connection of Moves to functional activities       Balance Exercises - 09/04/18 0817      Balance Exercises: Standing   Wall Bumps  Hip    Wall Bumps-Hips  Eyes opened;Anterior/posterior;10 reps   2nd set-work on increased control   Stepping Strategy  Anterior;Posterior;Lateral;UE support;10 reps   forward/back step and weight shift x 10 reps   Heel Raises Limitations  x 10 reps-limited heel lift from ground    Toe Raise Limitations  x 10 reps-limited toe lift from ground (more limitation on RLE)    Sit to Stand Time  Sit<>stand practice , 2 sets x 5 reps from mat, with focus on technique for foot  placement (slightly staggered) and for scooting, increased forward lean, and upright stand (avoid shoulders posterior to hips), for optimal stability upon standing.  Practiced lateral weightshifting upon standing to initiate steps.    Other Standing Exercises  Reviewed exercises given at eval:  stagger stance rocking forward and back (cues to work within limits of stability and avoid too big movements in posteiror direction); Practiced rocking turns to R and L 1 rep with cues, min guard; then practiced quarter turns to R and L 1 rep, min guartd-pt experiences LOB with quarter turns due to largeness of step        PT Education - 09/05/18 0702    Education Details  Sit<>stand technique (did not give handout); standing PWR! Moves (modified twist and forward>back step/weightshift); educated that pt needs to use cane at all times for safety    Person(s) Educated  Patient    Methods  Explanation;Demonstration;Handout;Verbal cues    Comprehension  Verbalized understanding;Returned demonstration;Verbal cues required           PT Long Term Goals - 08/27/18 1502      PT LONG TERM GOAL #1   Title  Pt will be independent with HEP for improved strength, balance, gait.  TARGET 09/25/2018    Time  4    Period  Weeks    Status  New    Target Date  09/25/18      PT LONG TERM GOAL #2   Title  Pt will improve 5x sit<>stand to less than or equal to 12.5 seconds with no posterior lean for improved transfer efficiency and safety.    Time  4    Period  Weeks    Status  New    Target Date  09/25/18      PT LONG TERM GOAL #3   Title  Pt will improve TUG score to less than or equal to 13.5 seconds for decreased fall risk.    Time  4    Period  Weeks    Status  New    Target Date  09/25/18      PT LONG TERM GOAL #4   Title  Pt will improve MiniBESTest score to at least 16/28 for decreased fall risk.    Time  4    Period  Weeks    Status  New    Target Date  09/25/18      PT LONG TERM GOAL #5   Title  Pt will verbalize understanding of fall prevention in home environment.    Time  4    Period  Weeks    Status  New    Target Date  09/25/18      Additional Long Term Goals   Additional Long Term Goals  Yes      PT LONG TERM GOAL #6   Title  Pt will verbalize understanding of local Parkinson's disease-related resources.    Time  4    Period  Weeks    Status  New    Target Date  09/25/18            Plan - 09/05/18 0707    Clinical Impression Statement  Focused skilled PT session this visit on ankle, hip, step strategy work, standing PWR! Moves for posture, weightshifting, trunk flexibility, and stepping initiation/turns.  As pt progresses through sets of exercises, pt initially needs tactile, verbal cueing and improves for more controlled motions within limits of stability.  However, with transition movements, without PT-specific cues, pt  demonstrates decreased coordination and control of movement patterns practiced, and will need additional practice and cues (especially with turns and  initiation of stepping)    Rehab Potential  Good    PT Frequency  3x / week    PT Duration  4 weeks   plus eval (PT prefers long POC, but pt scheduled for RTC surgery 09/29/2018)   PT Treatment/Interventions  ADLs/Self Care Home Management;Therapeutic exercise;Therapeutic activities;Functional mobility training;Gait training;DME Instruction;Balance training;Neuromuscular re-education;Patient/family education;Orthotic Fit/Training    PT Next Visit Plan  Review HEP given this visit; continue hip, ankle strategy, step strategy work; turning, sit<>stand, and initiation of gait; simulate activities with sling (following shoulder surgery); ask about OT    Consulted and Agree with Plan of Care  Patient       Patient will benefit from skilled therapeutic intervention in order to improve the following deficits and impairments:  Abnormal gait, Decreased balance, Decreased knowledge of precautions, Decreased safety awareness, Decreased mobility, Decreased strength, Difficulty walking, Postural dysfunction  Visit Diagnosis: Unsteadiness on feet  Abnormal posture  Other abnormalities of gait and mobility     Problem List There are no active problems to display for this patient.   Bethanee Redondo W. 09/05/2018, 7:11 AM  Frazier Butt., PT   [facility] [address] [place], [place], [address] Phone: [phone]   Fax:  [phone]  Name: Javar Eshbach MRN: [id number] Date of Birth: [birth year]

## 2018-09-05 NOTE — Patient Instructions (Signed)
Provided handout for Standing PWR! Moves, 20 reps, daily -modified picture for PWR! Twist-across body reach to cabinet -picture for forward>back step and weightshift

## 2018-09-08 ENCOUNTER — Encounter: Payer: Self-pay | Admitting: Physical Therapy

## 2018-09-08 ENCOUNTER — Ambulatory Visit: Payer: Medicare Other | Admitting: Physical Therapy

## 2018-09-08 DIAGNOSIS — R2689 Other abnormalities of gait and mobility: Secondary | ICD-10-CM

## 2018-09-08 DIAGNOSIS — R2681 Unsteadiness on feet: Secondary | ICD-10-CM | POA: Diagnosis not present

## 2018-09-08 DIAGNOSIS — M6281 Muscle weakness (generalized): Secondary | ICD-10-CM

## 2018-09-08 DIAGNOSIS — R293 Abnormal posture: Secondary | ICD-10-CM

## 2018-09-08 NOTE — Therapy (Signed)
[facility] [address] [place], [place], [address] Phone: [phone]   Fax:  [phone]  Physical Therapy Treatment  Patient Details  Name: [facility] MRN: [id number] Date of Birth: 10/08/1950 Referring Provider (PT): Jennelle Human, MD   Encounter Date: 09/08/2018  PT End of Session - 09/08/18 0804    Visit Number  3    Number of Visits  13    Date for PT Re-Evaluation  10/26/18    Authorization Type  Medicare and [place] (covered 100%)-will need 10th visit progress notes    PT Start Time  0803    PT Stop Time  0845    PT Time Calculation (min)  42 min    Equipment Utilized During Treatment  --    Activity Tolerance  Patient tolerated treatment well    Behavior During Therapy  [facility] for tasks assessed/performed       History reviewed. No pertinent past medical history.  Past Surgical History:  Procedure Laterality Date  . 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  Subjective Assessment - 09/08/18 0805    Subjective  No changes, no falls.     Pertinent History  08/2017 L THR; 05/23/18 L hip dislocation (was put back in without surgery), L RTC tear    Patient Stated Goals  Pt's goal for therapy is to improve balance and get back to where I was one year ago.     Currently in Pain?  Yes    Pain Score  2     Pain Location  Shoulder    Pain Orientation  Left    Pain Descriptors / Indicators  Aching    Pain Type  Chronic pain    Pain Onset  More than a month ago    Pain Frequency  Intermittent    Aggravating Factors   sleeping, driving    Pain Relieving Factors  being in recliner                       [facility] Adult PT Treatment/Exercise - 09/08/18 1047      Ambulation/Gait   Ambulation/Gait  Yes    Ambulation/Gait Assistance  5: Supervision    Ambulation/Gait Assistance Details  vc to slow down, focus on heelstrike and push off    Ambulation  Distance (Feet)  400 Feet    Assistive device  Straight cane    Gait Pattern  Step-through pattern;Decreased arm swing - left;Decreased step length - left;Decreased step length - right;Decreased dorsiflexion - right;Decreased dorsiflexion - left;Right foot flat;Left foot flat;Trendelenburg    Pre-Gait Activities  at counter:  Forward step, wt-shift with hip extension upright posture and return to start/center x 15 each leg; forward step as above and step backwards x 10 each leg (pt with several episodes of imbalance with backwards step--able to self-correct holding onto counter)     Posture/Postural Control   Posture Comments  vc throughout for full hip extension, shoulders back and down      Balance strategies/ankle strengthening on rocker board anterior/posterior in // bars with intermittent UE support. Pt with difficulty shifting weight anteriorly to hold x 3 seconds (loses balance forwards) and with hip hinge with wt-shift back onto heels (losing his balance backwards).   [facility]) - 09/08/18 0815    PWR! Up  10 x 2    PWR! Rock  20    PWR! Twist  20    PWR Step  20    Comments  at counter except twist in corner; vc to slow down and fully extend elbows, fully supinate; vc for proper squat technique          PT Education - 09/08/18 1051    Education Details  practice sit to stand x 10 from the recliner he plans to sleep in after shoulder surgery    Person(s) Educated  Patient    Methods  Explanation;Demonstration;Verbal cues    Comprehension  Verbalized understanding;Returned demonstration;Verbal cues required          PT Long Term Goals - 08/27/18 1502      PT LONG TERM GOAL #1   Title  Pt will be independent with HEP for improved strength, balance, gait.  TARGET 09/25/2018    Time  4    Period  Weeks    Status  New    Target Date  09/25/18      PT LONG TERM GOAL #2   Title  Pt will improve 5x sit<>stand to less than or equal to 12.5 seconds with no posterior lean for  improved transfer efficiency and safety.    Time  4    Period  Weeks    Status  New    Target Date  09/25/18      PT LONG TERM GOAL #3   Title  Pt will improve TUG score to less than or equal to 13.5 seconds for decreased fall risk.    Time  4    Period  Weeks    Status  New    Target Date  09/25/18      PT LONG TERM GOAL #4   Title  Pt will improve MiniBESTest score to at least 16/28 for decreased fall risk.    Time  4    Period  Weeks    Status  New    Target Date  09/25/18      PT LONG TERM GOAL #5   Title  Pt will verbalize understanding of fall prevention in home environment.    Time  4    Period  Weeks    Status  New    Target Date  09/25/18      Additional Long Term Goals   Additional Long Term Goals  Yes      PT LONG TERM GOAL #6   Title  Pt will verbalize understanding of local Parkinson's disease-related resources.    Time  4    Period  Weeks    Status  New    Target Date  09/25/18            Plan - 09/08/18 1052    Clinical Impression Statement  Skilled session included review of standing PWR! exercises given for HEP (pt required min cues for technique). Done at the counter and pt with frequent imbalance and regained balance using counter. Balance improved when pt slowed down. Performed pre-gait activities progressing to gait with SPC with focus on improved step length, heelstrike, and upright posture. Patient can continue to benefit from PT to work towards goals.     Rehab Potential  Good    PT Frequency  3x / week    PT Duration  4 weeks   plus eval (PT prefers long POC, but pt scheduled for RTC surgery 09/29/2018)   PT Treatment/Interventions  ADLs/Self Care Home Management;Therapeutic exercise;Therapeutic activities;Functional mobility training;Gait training;DME Instruction;Balance training;Neuromuscular re-education;Patient/family education;Orthotic Fit/Training    PT  Next Visit Plan  continue to review standing PWR! (has been given for HEP; he needs  to slow down) continue hip, ankle strategy, step strategy work; turning, sit<>stand, and initiation of gait; simulate activities with sling (following shoulder surgery); ask about OT    Consulted and Agree with Plan of Care  Patient       Patient will benefit from skilled therapeutic intervention in order to improve the following deficits and impairments:  Abnormal gait, Decreased balance, Decreased knowledge of precautions, Decreased safety awareness, Decreased mobility, Decreased strength, Difficulty walking, Postural dysfunction  Visit Diagnosis: Unsteadiness on feet  Abnormal posture  Other abnormalities of gait and mobility  Muscle weakness (generalized)     Problem List There are no active problems to display for this patient.   Rexanne Mano, PT 09/08/2018, 11:01 AM  [facility] [address] [place], [place], [address] Phone: [phone]   Fax:  [phone]  Name: Colon Rueth MRN: [id number] Date of Birth: Jul 02, 1950

## 2018-09-09 ENCOUNTER — Other Ambulatory Visit: Payer: Self-pay

## 2018-09-09 ENCOUNTER — Ambulatory Visit: Payer: Medicare Other | Admitting: Physical Therapy

## 2018-09-09 ENCOUNTER — Encounter: Payer: Self-pay | Admitting: Physical Therapy

## 2018-09-09 DIAGNOSIS — R2681 Unsteadiness on feet: Secondary | ICD-10-CM

## 2018-09-09 DIAGNOSIS — R2689 Other abnormalities of gait and mobility: Secondary | ICD-10-CM

## 2018-09-09 DIAGNOSIS — R293 Abnormal posture: Secondary | ICD-10-CM

## 2018-09-09 NOTE — Therapy (Signed)
[facility] [address] [place], [place], [address] Phone: [phone]   Fax:  [phone]  Physical Therapy Treatment  Patient Details  Name: [facility] MRN: [id number] Date of Birth: 18-Jun-1951 Referring Provider (PT): Jennelle Human, MD   Encounter Date: 09/09/2018  PT End of Session - 09/09/18 1139    Visit Number  4    Number of Visits  13    Date for PT Re-Evaluation  10/26/18    Authorization Type  Medicare and [place] (covered 100%)-will need 10th visit progress notes    PT Start Time  0805    PT Stop Time  0845    PT Time Calculation (min)  40 min    Activity Tolerance  Patient tolerated treatment well    Behavior During Therapy  [facility] for tasks assessed/performed       History reviewed. No pertinent past medical history.  Past Surgical History:  Procedure Laterality Date  . 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  Subjective Assessment - 09/09/18 0807    Subjective  No changes, no falls, shoulder bothering me and limited my sleep last night.    Pertinent History  08/2017 L THR; 05/23/18 L hip dislocation (was put back in without surgery), L RTC tear    Patient Stated Goals  Pt's goal for therapy is to improve balance and get back to where I was one year ago.     Currently in Pain?  Yes    Pain Score  4     Pain Location  Shoulder    Pain Orientation  Left    Pain Descriptors / Indicators  Aching    Pain Type  Chronic pain    Pain Onset  More than a month ago    Aggravating Factors   sleeping, driving    Pain Relieving Factors  being in recliner                       [facility] Adult PT Treatment/Exercise - 09/09/18 0001      Ambulation/Gait   Ambulation/Gait  Yes    Ambulation/Gait Assistance  5: Supervision    Ambulation Distance (Feet)  300 Feet   100 x 2   Assistive device  Straight cane   with tripod base   Gait Pattern   Step-through pattern;Decreased arm swing - left;Decreased step length - left;Decreased step length - right;Decreased dorsiflexion - right;Decreased dorsiflexion - left;Right foot flat;Left foot flat;Trendelenburg    Ambulation Surface  Level;Indoor    Gait Comments  Pt demonstrates improved attention to slower, more deliberate movement patterns with gait      Posture/Postural Control   Posture Comments  Seated postural re-education at edge of mat:  shoulder blade squeezes (educated on upright posture for reaching) to help to lessen shoulder pain      Therapeutic Activites    Therapeutic Activities  Other Therapeutic Activities    Other Therapeutic Activities  Standing with side BOS, also provided trial of 24"a nd 29" stools (to simulate for possible use in as best man in wedding)-to prvent posterior LOB and possible fall while standing during ceremony        [facility]) - 09/09/18 4010    PWR! exercises  Moves in standing;Functional moves    PWR! Up  x 20    PWR! Rock  x 20    PWR! Twist  x 20  modified reaching across body (has some posterior LOB, requires cues to reach across and forward to counter)    PWR Step  x 20    Comments  Verbal cues to slow pace, dedicated pause between exercises to practice on balance static standing (in preparation for standing at friend's wedding this weekend)    PWR! Sit to Stand  x 10 from mat surface, then x 10 from 18" chair (cues for attending to shoulder positioning, to avoid posterior lean)       Balance Exercises - 09/09/18 1118      Balance Exercises: Standing   Rockerboard  Anterior/posterior;EO   Ankle/hip strategy work; forward step taps x 10   Other Standing Exercises  Forward/back step and weightshift x 12 reps each leg with UE support.  Static standing 2-5 minutes with cues for strategies for lateral weightshift and repositioning using cane (no episodes of posterior lean)        PT Education - 09/09/18 1137    Education Details  practice  static standing 5-10 minutes with weightshifting and stepping, use of 24-29" stool for wedding this weekend    Person(s) Educated  Patient    Methods  Explanation;Demonstration    Comprehension  Verbalized understanding;Returned demonstration;Verbal cues required          PT Long Term Goals - 08/27/18 1502      PT LONG TERM GOAL #1   Title  Pt will be independent with HEP for improved strength, balance, gait.  TARGET 09/25/2018    Time  4    Period  Weeks    Status  New    Target Date  09/25/18      PT LONG TERM GOAL #2   Title  Pt will improve 5x sit<>stand to less than or equal to 12.5 seconds with no posterior lean for improved transfer efficiency and safety.    Time  4    Period  Weeks    Status  New    Target Date  09/25/18      PT LONG TERM GOAL #3   Title  Pt will improve TUG score to less than or equal to 13.5 seconds for decreased fall risk.    Time  4    Period  Weeks    Status  New    Target Date  09/25/18      PT LONG TERM GOAL #4   Title  Pt will improve MiniBESTest score to at least 16/28 for decreased fall risk.    Time  4    Period  Weeks    Status  New    Target Date  09/25/18      PT LONG TERM GOAL #5   Title  Pt will verbalize understanding of fall prevention in home environment.    Time  4    Period  Weeks    Status  New    Target Date  09/25/18      Additional Long Term Goals   Additional Long Term Goals  Yes      PT LONG TERM GOAL #6   Title  Pt will verbalize understanding of local Parkinson's disease-related resources.    Time  4    Period  Weeks    Status  New    Target Date  09/25/18            Plan - 09/09/18 1140    Clinical Impression Statement  Skilled session included standing PWR! Moves, functional moves with transfers,  gait activities.  Overall, pt appears to have increased awareness of movements patterns and appears more steady and more controlled with gait activities.    Rehab Potential  Good    PT Frequency  3x /  week    PT Duration  4 weeks   plus eval (PT prefers long POC, but pt scheduled for RTC surgery 09/29/2018)   PT Treatment/Interventions  ADLs/Self Care Home Management;Therapeutic exercise;Therapeutic activities;Functional mobility training;Gait training;DME Instruction;Balance training;Neuromuscular re-education;Patient/family education;Orthotic Fit/Training    PT Next Visit Plan  Continue hip, ankle, step strategy work; runs, sit<>stand from various surfaces; ?simulate activities with sling (for following ankle surgery)    Consulted and Agree with Plan of Care  Patient       Patient will benefit from skilled therapeutic intervention in order to improve the following deficits and impairments:  Abnormal gait, Decreased balance, Decreased knowledge of precautions, Decreased safety awareness, Decreased mobility, Decreased strength, Difficulty walking, Postural dysfunction  Visit Diagnosis: Unsteadiness on feet  Abnormal posture  Other abnormalities of gait and mobility     Problem List There are no active problems to display for this patient.   Masao Junker W. 09/09/2018, 11:51 AM  Frazier Butt., PT   [facility] [address] [place], [place], [address] Phone: [phone]   Fax:  [phone]  Name: Jim [facility] MRN: [id number] Date of Birth: 06/06/1951

## 2018-09-14 ENCOUNTER — Ambulatory Visit: Payer: Medicare Other | Admitting: Physical Therapy

## 2018-09-14 ENCOUNTER — Other Ambulatory Visit: Payer: Self-pay

## 2018-09-14 ENCOUNTER — Encounter: Payer: Self-pay | Admitting: Physical Therapy

## 2018-09-14 DIAGNOSIS — R2681 Unsteadiness on feet: Secondary | ICD-10-CM | POA: Diagnosis not present

## 2018-09-14 DIAGNOSIS — R2689 Other abnormalities of gait and mobility: Secondary | ICD-10-CM

## 2018-09-14 NOTE — Therapy (Signed)
[facility] [address] [place], [place], [address] Phone: [phone]   Fax:  [phone]  Physical Therapy Treatment  Patient Details  Name: Jim Carson MRN: [id number] Date of Birth: [birth year] Referring Provider (PT): Jennelle Human, MD   Encounter Date: 09/14/2018  PT End of Session - 09/14/18 1705    Visit Number  5    Number of Visits  13    Date for PT Re-Evaluation  10/26/18    Authorization Type  Medicare and [place] (covered 100%)-will need 10th visit progress notes    PT Start Time  0848    PT Stop Time  0930    PT Time Calculation (min)  42 min    Activity Tolerance  Patient tolerated treatment well    Behavior During Therapy  [facility] for tasks assessed/performed       History reviewed. No pertinent past medical history.  Past Surgical History:  Procedure Laterality Date  . 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  Subjective Assessment - 09/14/18 0852    Subjective  No falls, wedding went well.    Pertinent History  08/2017 L THR; 05/23/18 L hip dislocation (was put back in without surgery), L RTC tear    Patient Stated Goals  Pt's goal for therapy is to improve balance and get back to where I was one year ago.     Currently in Pain?  Yes    Pain Score  2     Pain Location  Shoulder    Pain Orientation  Left    Pain Descriptors / Indicators  Aching    Pain Type  Chronic pain    Pain Onset  More than a month ago    Pain Frequency  Intermittent    Aggravating Factors   sleeping, driving    Pain Relieving Factors  being in recliner                       [facility] Adult PT Treatment/Exercise - 09/14/18 0001      Transfers   Transfers  Sit to Stand;Stand to Sit    Sit to Stand  5: Supervision;Without upper extremity assist;From bed    Sit to Stand Details  Verbal cues for technique;Verbal cues for sequencing    Stand to Sit  5:  Supervision;Without upper extremity assist;To bed    Stand to Sit Details (indicate cue type and reason)  Verbal cues for sequencing;Verbal cues for technique    Number of Reps  --   5 reps   Comments  Simulated sitting in recliner chair, on mat surface with doubled blue-mat for added cushion-Practiced lateral weightshifting/rocking for scooting forward and back (without use of UE support).  Practiced       Self-Care   Self-Care  Other Self-Care Comments    Other Self-Care Comments   Discussed mobility limitations surrounding upcoming shoulder surgery 09/29/2018.  Provided information on Aware in Care Kit, importance of PD medications being taken on time, every time, and medication interactions with typical PD medications (in preparation for upcoming surgery).  Disucssed using 3:1 over toilet to given handle to push with RLE to stand from toileting (pt already has one from a previous surgery).  Discussed sitting to shower (?need for shower chair and wife's supervision/assistance as needed).            Balance Exercises - 09/14/18 [phone]  Balance Exercises: Standing   Wall Bumps  Hip    Wall Bumps-Hips  Eyes opened;Anterior/posterior;10 reps   2 sets   Stepping Strategy  Anterior;Posterior;Lateral;Foam/compliant surface;UE support;10 reps   Cues for foot clearance   Heel Raises Limitations  x 10 reps-limited heel lift from ground    Toe Raise Limitations  x 10 reps-limited toe lift from ground (more limitation on RLE)    Other Standing Exercises  Stagger stance forward/back weigthshifting 2 sets x 10 reps        PT Education - 09/14/18 1704    Education Details  Provided information on Parkinson's Aware in Care Kit    Person(s) Educated  Patient    Methods  Explanation;Handout    Comprehension  Verbalized understanding          PT Long Term Goals - 08/27/18 1502      PT LONG TERM GOAL #1   Title  Pt will be independent with HEP for improved strength, balance, gait.  TARGET  09/25/2018    Time  4    Period  Weeks    Status  New    Target Date  09/25/18      PT LONG TERM GOAL #2   Title  Pt will improve 5x sit<>stand to less than or equal to 12.5 seconds with no posterior lean for improved transfer efficiency and safety.    Time  4    Period  Weeks    Status  New    Target Date  09/25/18      PT LONG TERM GOAL #3   Title  Pt will improve TUG score to less than or equal to 13.5 seconds for decreased fall risk.    Time  4    Period  Weeks    Status  New    Target Date  09/25/18      PT LONG TERM GOAL #4   Title  Pt will improve MiniBESTest score to at least 16/28 for decreased fall risk.    Time  4    Period  Weeks    Status  New    Target Date  09/25/18      PT LONG TERM GOAL #5   Title  Pt will verbalize understanding of fall prevention in home environment.    Time  4    Period  Weeks    Status  New    Target Date  09/25/18      Additional Long Term Goals   Additional Long Term Goals  Yes      PT LONG TERM GOAL #6   Title  Pt will verbalize understanding of local Parkinson's disease-related resources.    Time  4    Period  Weeks    Status  New    Target Date  09/25/18            Plan - 09/14/18 1706    Clinical Impression Statement  Continued skilled mobility training, including hip, ankle, step strategies, compliant surface balance activtity, scooting in preparation for recliner transfers at home (with sling after shoulder surgery).  Pt continues to demonstrate improved ease of movement, but does require UE support with counter balance activities.    Rehab Potential  Good    PT Frequency  3x / week    PT Duration  4 weeks   plus eval (PT prefers long POC, but pt scheduled for RTC surgery 09/29/2018)   PT Treatment/Interventions  ADLs/Self Care Home Management;Therapeutic exercise;Therapeutic activities;Functional  mobility training;Gait training;DME Instruction;Balance training;Neuromuscular re-education;Patient/family  education;Orthotic Fit/Training    PT Next Visit Plan  Review scooting/transfers from simulated recliner; Continue hip, ankle, step strategy work; turns, sit<>stand from various surfaces; ?simulate activities with sling (for following shoulder surgery)    Consulted and Agree with Plan of Care  Patient       Patient will benefit from skilled therapeutic intervention in order to improve the following deficits and impairments:  Abnormal gait, Decreased balance, Decreased knowledge of precautions, Decreased safety awareness, Decreased mobility, Decreased strength, Difficulty walking, Postural dysfunction  Visit Diagnosis: Unsteadiness on feet  Other abnormalities of gait and mobility     Problem List There are no active problems to display for this patient.   Margurette Brener W. 09/14/2018, 5:09 PM  [facility] [address] [place], [place], [address] Phone: [phone]   Fax:  [phone]  Name: Jim Carson MRN: [id number] Date of Birth: 18-Sep-1950

## 2018-09-14 NOTE — Patient Instructions (Signed)
Parkinson's Aware in [place] through [employer]: [email or web address] 1-800-4PD-INFO

## 2018-09-16 ENCOUNTER — Ambulatory Visit: Payer: Medicare Other | Admitting: Physical Therapy

## 2018-09-16 ENCOUNTER — Encounter: Payer: Self-pay | Admitting: Physical Therapy

## 2018-09-16 DIAGNOSIS — R2681 Unsteadiness on feet: Secondary | ICD-10-CM | POA: Diagnosis not present

## 2018-09-16 DIAGNOSIS — R2689 Other abnormalities of gait and mobility: Secondary | ICD-10-CM

## 2018-09-16 NOTE — Patient Instructions (Signed)
  Questions for Dr. Maureen Ralphs:  -Specific exercise questions  -Any restrictions or precautions?-Bending beyond 90 degrees, crossing legs, going over midline?-Still under any precautions-if so, for how long?  -Should I still feel any pain?  -Currently working with PT for Parkinson's disease exercises/balance-is there anything that PT needs to be aware of for restrictions/precautions?

## 2018-09-16 NOTE — Therapy (Signed)
[facility] [address] [place], [place], [address] Phone: [phone]   Fax:  [phone]  Physical Therapy Treatment  Patient Details  Name: Jim Carson MRN: [id number] Date of Birth: [birth year] Referring Provider (PT): Jennelle Human, MD   Encounter Date: 09/16/2018  PT End of Session - 09/16/18 1158    Visit Number  6    Number of Visits  13    Date for PT Re-Evaluation  10/26/18    Authorization Type  Medicare and [place] (covered 100%)-will need 10th visit progress notes    PT Start Time  0847    PT Stop Time  0930    PT Time Calculation (min)  43 min    Activity Tolerance  Patient tolerated treatment well    Behavior During Therapy  [facility] for tasks assessed/performed       History reviewed. No pertinent past medical history.  Past Surgical History:  Procedure Laterality Date  . 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  Subjective Assessment - 09/16/18 0850    Subjective  No falls, no changes since last visit.  Went online and ordered Aware in [place].    Pertinent History  08/2017 L THR; 05/23/18 L hip dislocation (was put back in without surgery), L RTC tear    Patient Stated Goals  Pt's goal for therapy is to improve balance and get back to where I was one year ago.     Currently in Pain?  Yes    Pain Score  1     Pain Location  Shoulder    Pain Orientation  Left    Pain Descriptors / Indicators  Aching    Pain Onset  More than a month ago    Pain Frequency  Intermittent    Aggravating Factors   sleeping, driving    Pain Relieving Factors  being in recliner                       [facility] Adult PT Treatment/Exercise - 09/16/18 0001      Transfers   Transfers  Sit to Stand;Stand to Sit    Sit to Stand  5: Supervision;Without upper extremity assist;From bed;From chair/3-in-1    Sit to Stand Details  Verbal cues for sequencing;Verbal cues  for technique    Stand to Sit  5: Supervision;Without upper extremity assist;To bed;To chair/3-in-1    Stand to Sit Details (indicate cue type and reason)  Verbal cues for sequencing;Verbal cues for technique    Number of Reps  10 reps;Other sets (comment)   from 20" mat, 18" chair, 16" chair, 15" cushion surface   Comments  Round-robin stand and turn to sit (90 degree turns, 180 turns-using marching and rocking and side-stepping turns); Performed multiple reps      High Level Balance   High Level Balance Activities  Turns;Marching turns;Weight-shifting turns    High Level Balance Comments  Practiced short distance walking with marching turns          Balance Exercises - 09/16/18 1043      Balance Exercises: Standing   Wall Bumps  Hip    Wall Bumps-Hips  Eyes opened;Anterior/posterior;10 reps   2 sets   Retro Gait  Upper extremity support;4 reps   Forward/back walking along counter   Heel Raises Limitations  2 sets x 10 reps-limited heel lift from ground    Toe Raise  Limitations  2 sets x 10 reps-limited toe lift from ground (more limitation on RLE)    Other Standing Exercises  Standing beside counter, with 1 UE support, stagger stance forward/back rocking 10 reps        PT Education - 09/16/18 1157    Education Details  Discussed questions to ask MD (Dr. Maureen Ralphs) at f/u hip visit tomorrow    Person(s) Educated  Patient    Methods  Explanation    Comprehension  Verbalized understanding          PT Long Term Goals - 08/27/18 1502      PT LONG TERM GOAL #1   Title  Pt will be independent with HEP for improved strength, balance, gait.  TARGET 09/25/2018    Time  4    Period  Weeks    Status  New    Target Date  09/25/18      PT LONG TERM GOAL #2   Title  Pt will improve 5x sit<>stand to less than or equal to 12.5 seconds with no posterior lean for improved transfer efficiency and safety.    Time  4    Period  Weeks    Status  New    Target Date  09/25/18      PT  LONG TERM GOAL #3   Title  Pt will improve TUG score to less than or equal to 13.5 seconds for decreased fall risk.    Time  4    Period  Weeks    Status  New    Target Date  09/25/18      PT LONG TERM GOAL #4   Title  Pt will improve MiniBESTest score to at least 16/28 for decreased fall risk.    Time  4    Period  Weeks    Status  New    Target Date  09/25/18      PT LONG TERM GOAL #5   Title  Pt will verbalize understanding of fall prevention in home environment.    Time  4    Period  Weeks    Status  New    Target Date  09/25/18      Additional Long Term Goals   Additional Long Term Goals  Yes      PT LONG TERM GOAL #6   Title  Pt will verbalize understanding of local Parkinson's disease-related resources.    Time  4    Period  Weeks    Status  New    Target Date  09/25/18            Plan - 09/16/18 1159    Clinical Impression Statement  Focus of skilled physical therapy visit today on transfers, turns and short distance change of directions; balance strategies, particularly for posterior LOB.  Pt will continue to benefit from skilled PT to address further balance, functional strengthening, and gait coordination activities.    Rehab Potential  Good    PT Frequency  3x / week    PT Duration  4 weeks   plus eval (PT prefers long POC, but pt scheduled for RTC surgery 09/29/2018)   PT Treatment/Interventions  ADLs/Self Care Home Management;Therapeutic exercise;Therapeutic activities;Functional mobility training;Gait training;DME Instruction;Balance training;Neuromuscular re-education;Patient/family education;Orthotic Fit/Training    PT Next Visit Plan  Continue hip, ankle, step strategy work, turns, sit<>stand from varied surfaces, compliant surface work for balance; gait training.  Follow up with pt on Dr. Peri Maris visit 09/17/2018    Consulted  and Agree with Plan of Care  Patient       Patient will benefit from skilled therapeutic intervention in order to improve the  following deficits and impairments:  Abnormal gait, Decreased balance, Decreased knowledge of precautions, Decreased safety awareness, Decreased mobility, Decreased strength, Difficulty walking, Postural dysfunction  Visit Diagnosis: Unsteadiness on feet  Other abnormalities of gait and mobility     Problem List There are no active problems to display for this patient.   Nesreen Albano W. 09/16/2018, 12:06 PM Frazier Butt., PT  [facility] [address] [place], [place], [address] Phone: [phone]   Fax:  [phone]  Name: Jim Carson MRN: [id number] Date of Birth: 1951-01-02

## 2018-09-18 ENCOUNTER — Ambulatory Visit: Payer: Medicare Other | Admitting: Physical Therapy

## 2018-09-21 ENCOUNTER — Ambulatory Visit: Payer: Medicare Other | Admitting: Physical Therapy

## 2018-09-22 ENCOUNTER — Telehealth: Payer: Self-pay | Admitting: Physical Therapy

## 2018-09-22 NOTE — Telephone Encounter (Signed)
Mohanad Carsten was contacted today regarding the temporary closing of [facility] due to Covid-19.  Therapist discussed:  Importance of continuing HEP daily.  Patient is not interested in further information for an e-visit, virtual check in, or telehealth visit, if those services become available.    [facility] will follow up with patients when we are able to resume care.  Mady Haagensen, [facility] [address] [place], [place]  [address] Phone:  [phone] Fax:  [phone] \

## 2018-09-23 ENCOUNTER — Ambulatory Visit: Payer: Medicare Other | Admitting: Physical Therapy

## 2018-09-24 ENCOUNTER — Telehealth: Payer: Self-pay | Admitting: Physical Therapy

## 2018-09-24 NOTE — Telephone Encounter (Signed)
Returned patient's call from 09/23/2018.  Pt is inquiring about what the next expected set of exercises would be and how remote visits would work.  Explained to Jim Carson that based on his POC, we would likely progress balance exercises, and we are not sure how remote visits would work at this time.  As he is interested, noted that we will give him a call should that become a possibility.  Also, pt reported that he got a good report from Dr. Maureen Ralphs last week-no specific restrictions or precautions, other than avoid heavy lifting.  Mady Haagensen, PT 09/24/18 10:34 AM Phone: [phone] Fax: [phone]

## 2018-09-25 ENCOUNTER — Ambulatory Visit: Payer: Medicare Other | Admitting: Physical Therapy

## 2018-10-07 ENCOUNTER — Telehealth: Payer: Self-pay | Admitting: Physical Therapy

## 2018-10-07 NOTE — Telephone Encounter (Signed)
Jim Carson was contacted today regarding the temporary closing of [facility] due to Covid-19.  Therapist discussed current HEP and option for telehealth visit.    Patient is interested in further information for an e-visit, virtual check in, or telehealth visit.  Pt was nformed he would be contacted for this service.     [facility] will follow up with patients when we are able to resume care.  Guido Sander, [facility] [address] [place], [place]  [address] Phone:  [phone] Fax:  [phone] \

## 2018-10-26 ENCOUNTER — Telehealth: Payer: Self-pay | Admitting: Physical Therapy

## 2018-10-26 NOTE — Telephone Encounter (Signed)
Left message regarding extended clinic closure due to COVID-19 restrictions; advised patient to call clinic with any physical therapy-related questions or needs.  Mady Haagensen, PT 10/26/18 2:43 PM Phone: [phone] Fax: [phone]

## 2018-11-17 ENCOUNTER — Telehealth (HOSPITAL_COMMUNITY): Payer: Self-pay

## 2018-11-17 NOTE — Telephone Encounter (Signed)
Spoke with pt he states the MD order he has is for a MBSS and he wants to go somewhere in [place] that would be closer to his home. Gave pt [facility] [phone]

## 2018-11-19 ENCOUNTER — Other Ambulatory Visit: Payer: Self-pay

## 2018-11-19 ENCOUNTER — Ambulatory Visit: Payer: Medicare Other | Attending: Neurology | Admitting: Physical Therapy

## 2018-11-19 DIAGNOSIS — R2689 Other abnormalities of gait and mobility: Secondary | ICD-10-CM | POA: Diagnosis present

## 2018-11-19 DIAGNOSIS — M6281 Muscle weakness (generalized): Secondary | ICD-10-CM | POA: Insufficient documentation

## 2018-11-19 DIAGNOSIS — R2681 Unsteadiness on feet: Secondary | ICD-10-CM | POA: Diagnosis not present

## 2018-11-19 DIAGNOSIS — R293 Abnormal posture: Secondary | ICD-10-CM | POA: Diagnosis present

## 2018-11-19 NOTE — Therapy (Signed)
[facility] [address] [place], [place], [address] Phone: [phone]   Fax:  [phone]  Physical Therapy Treatment  Patient Details  Name: Jim Carson MRN: [id number] Date of Birth: 16-Oct-1950 Referring Provider (PT): Jennelle Human, MD   Encounter Date: 11/19/2018 Physical Therapy Telehealth Visit:  I connected with Robbie Lis today at 1300 by Webex video conference and verified that I am speaking with the correct person using two identifiers.  I discussed the limitations, risks, security and privacy concerns of performing an evaluation and management service by Webex and the availability of in person appointments.  I also discussed with the patient that there may be a patient responsible charge related to this service. The patient expressed understanding and agreed to proceed.    The patient's address was confirmed.  Identified to the patient that therapist is a licensed physical therapist in the state of [place].  Verified phone # as [phone] to call in case of technical difficulties.    PT End of Session - 11/19/18 1616    Visit Number  7    Number of Visits  13    Date for PT Re-Evaluation  20/25/42   per recert 01/04/2375   Authorization Type  Medicare and [place] (covered 100%)-will need 10th visit progress notes    PT Start Time  1302    PT Stop Time  1347    PT Time Calculation (min)  45 min    Activity Tolerance  Patient tolerated treatment well    Behavior During Therapy  [facility] for tasks assessed/performed       No past medical history on file.  Past Surgical History:  Procedure Laterality Date  . 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  Subjective Assessment - 11/19/18 1409    Subjective  No falls, no changes since last visit.  Went online and ordered [employer] in [employer].    Pertinent History  08/2017 L THR; 05/23/18 L hip dislocation (was put back in  without surgery), L RTC tear    Patient Stated Goals  Pt's goal for therapy is to improve balance and get back to where I was one year ago.     Currently in Pain?  Yes   occasional shoulder pain   Pain Score  1     Pain Location  Shoulder    Pain Orientation  Left    Pain Descriptors / Indicators  Aching    Pain Type  Chronic pain    Pain Onset  More than a month ago    Pain Frequency  Intermittent    Aggravating Factors   sleeping, driving, malpositioning    Pain Relieving Factors  being in recliner    Effect of Pain on Daily Activities  Pt's shoulder surgery has been postponed due to COVID-19 restrictions; awaiting rescheduling         Neuro Re-education Addressing balance through review of HEP: Standing stagger stance forward/back weightshifting, with cues for deliberate push-off and rock back for dynamic heelcord stretch, x 10 reps each leg.  PWR! Moves in standing:  PWR! Up for posture and functional strengthening x 10 reps, PWR! Rock (lateral) for weightshifting side to side, x 10 reps, PWR! Twist (modified) for trunk rotation, reaching across body x 10 reps, then x 10 reps with relaxed arm swing around body (to avoid shoulder pain).  Cues for optimal weightshifting with rocking and twisting motions.  PWR! Step forward x 10, side x 10, back x 10.  Attempted forward>back step and weightshift, but pt experiences LOB.  Advised (and had patient demonstrate) to perform forward step and weightshift and back step and weightshift separately,  In back step and weightshift, pt requires UE support, and cues for deliberate step for balance recovery.  Heel/toe raises x 10 reps (ankle strategy)-pt has limited ability for heelraise; hip strategy wall bumps x 10 reps, 2 sets.                 [facility] PT Treatment/Exercise - 11/19/18 0001      Transfers   Transfers  Sit to Stand;Stand to Sit    Sit to Stand  6: Modified independent (Device/Increase time);Without upper extremity  assist;From chair/3-in-1    Five time sit to stand comments   12.15    Stand to Sit  6: Modified independent (Device/Increase time);Without upper extremity assist;To chair/3-in-1      Standardized Balance Assessment   Standardized Balance Assessment  Timed Up and Go Test      Timed Up and Go Test   TUG  Normal TUG    Normal TUG (seconds)  12.93      Self-Care   Self-Care  Other Self-Care Comments    Other Self-Care Comments   Given patient being out of therapy since 09/16/2018 due to COVID-19 restrictions, checked LTGs today and discussed progress towards goals and updating POC.  Discussed Dr. Peri Maris visit in March:  No specific precautions or restrictions regarding PT; pt no longer has to abide by hip precautions; regarding his occasional pain:  pain may be present occasionally and that is not abnormal.  Discussed pt's fall and provided information regarding fall prevention.             PT Education - 11/19/18 1615    Education Details  See self-care; fall prevention, progress towards goals, POC; review of HEP    Person(s) Educated  Patient    Methods  Explanation;Demonstration;Verbal cues    Comprehension  Verbalized understanding;Returned demonstration;Verbal cues required          PT Long Term Goals - 11/19/18 1616      PT LONG TERM GOAL #1   Title  Pt will be independent with HEP for improved strength, balance, gait.  TARGET 09/25/2018    Time  4    Period  Weeks    Status  Achieved      PT LONG TERM GOAL #2   Title  Pt will improve 5x sit<>stand to less than or equal to 12.5 seconds with no posterior lean for improved transfer efficiency and safety.    Time  4    Period  Weeks    Status  Achieved      PT LONG TERM GOAL #3   Title  Pt will improve TUG score to less than or equal to 13.5 seconds for decreased fall risk.    Time  4    Period  Weeks    Status  Achieved      PT LONG TERM GOAL #4   Title  Pt will improve MiniBESTest score to at least 16/28 for  decreased fall risk.    Time  4    Period  Weeks    Status  On-going      PT LONG TERM GOAL #5   Title  Pt will verbalize understanding of fall prevention in home environment.    Time  4  Period  Weeks    Status  Achieved      PT LONG TERM GOAL #6   Title  Pt will verbalize understanding of local Parkinson's disease-related resources.    Time  4    Period  Weeks    Status  Partially Met            Plan - 11/19/18 1617    Clinical Impression Statement  PT connected with patient via telehealth for first PT visit since 09/16/2018, due to COVID-19 restrictions.  Focused session of review of HEP and assessing LTGs.  Pt has met LTG 1, 2, 3, 5, and partially met LTG 6.  LTG 4 for MiniBESTest not able to be completed due to limitations in performing this test via telehealth.  Pt has had one fall since last PT session, and pt feels his balance is improving, but is not as good as he would like it.  Given length of time since last PT visit, and pt's fall and fall risk, pt would benefit from additional PT to address balance, fucntional strengthening and gait.    Rehab Potential  Good    PT Frequency  1x / week    PT Duration  6 weeks   per recert 1/61/0960   PT Treatment/Interventions  ADLs/Self Care Home Management;Therapeutic exercise;Therapeutic activities;Functional mobility training;Gait training;DME Instruction;Balance training;Neuromuscular re-education;Patient/family education;Orthotic Fit/Training    PT Next Visit Plan  Review and progress HEP if needed; hip/ankle strategy work, turns, four-square step-like activity, change of direction, variable direction stepping; SLS    Consulted and Agree with Plan of Care  Patient       Patient will benefit from skilled therapeutic intervention in order to improve the following deficits and impairments:  Abnormal gait, Decreased balance, Decreased knowledge of precautions, Decreased safety awareness, Decreased mobility, Decreased strength,  Difficulty walking, Postural dysfunction  Visit Diagnosis: Unsteadiness on feet  Other abnormalities of gait and mobility  Abnormal posture  Muscle weakness (generalized)     Problem List There are no active problems to display for this patient.   Nary Sneed W. 11/19/2018, 4:22 PM  Frazier Butt., PT   [facility] [address] [place], [place], [address] Phone: [phone]   Fax:  [phone]  Name: Daequan Kozma MRN: [id number] Date of Birth: 03/02/1951   Goals for renewal for ongoing POC:  PT Short Term Goals - 11/19/18 1631      PT SHORT TERM GOAL #1   Title  Pt will be independent with progression of HEP for balance, strength, gait.  TARGET 12/11/2018    Time  3    Period  Weeks    Status  New    Target Date  12/11/18      PT SHORT TERM GOAL #2   Title  Pt will perform floor to stand transfer, modified independently, for safe fall recovery.    Time  3    Period  Weeks    Status  New    Target Date  12/11/18      PT Long Term Goals - 11/19/18 1616      PT LONG TERM GOAL #1   Title  Pt will verbalize plans for continued community fitness upon d/c from PT.    Time  6    Period  Weeks    Status  New    Target Date  01/01/19      PT LONG TERM GOAL #2   Title  Pt will perform  1/4 and 1/2 turns R and L directions, at least 4 of 5 trials within a session, without LOB, for improved transitional movement.s    Time  6    Period  Weeks    Status  New    Target Date  01/01/19      PT LONG TERM GOAL #3   Title  Pt will improve MiniBESTest score to at least 16/28 for decreased fall risk.    Time  6    Period  Weeks    Status  New    Target Date  01/01/19      PT LONG TERM GOAL #4   Title  Pt will verbalize understanding of local Parkinson's disease-related resources.    Time  6    Period  Weeks    Status  New    Target Date  01/01/19      PT LONG TERM GOAL #5   Title       Time  --     Period  --    Status  --      PT LONG TERM GOAL #6   Title  --    Time  4    Period  --    Status  --      Mady Haagensen, PT 11/19/18 4:38 PM Phone: [phone] Fax: [phone]

## 2018-11-26 ENCOUNTER — Ambulatory Visit: Payer: Medicare Other | Admitting: Physical Therapy

## 2018-11-26 ENCOUNTER — Encounter: Payer: Self-pay | Admitting: Physical Therapy

## 2018-11-26 ENCOUNTER — Other Ambulatory Visit: Payer: Self-pay

## 2018-11-26 DIAGNOSIS — R2681 Unsteadiness on feet: Secondary | ICD-10-CM

## 2018-11-26 DIAGNOSIS — R2689 Other abnormalities of gait and mobility: Secondary | ICD-10-CM

## 2018-11-26 NOTE — Therapy (Signed)
[facility] [address] [place], [place], [address] Phone: [phone]   Fax:  [phone]  Physical Therapy Treatment  Patient Details  Name: Jim Carson MRN: [id number] Date of Birth: 1951-04-29 Referring Provider (PT): Jennelle Human, MD   Encounter Date: 11/26/2018  Physical Therapy Telehealth Visit:  I connected with Jim Carson today at 1000 by Webex video conference and verified that I am speaking with the correct person using two identifiers.  I discussed the limitations, risks, security and privacy concerns of performing an evaluation and management service by Webex and the availability of in person appointments.  I also discussed with the patient that there may be a patient responsible charge related to this service. The patient expressed understanding and agreed to proceed.    The patient's address was confirmed.  Identified to the patient that therapist is a licensed physical therapist in the state of [place].  Verified phone # as [phone] to call in case of technical difficulties.    PT End of Session - 11/26/18 1102    Visit Number  8    Number of Visits  13    Date for PT Re-Evaluation  29/93/71   per recert 6/96/7893   Authorization Type  Medicare and [employer] (covered 100%)-will need 10th visit progress notes    PT Start Time  1000    PT Stop Time  1043    PT Time Calculation (min)  43 min    Activity Tolerance  Patient tolerated treatment well    Behavior During Therapy  WFL for tasks assessed/performed       History reviewed. No pertinent past medical history.  Past Surgical History:  Procedure Laterality Date  . 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  Subjective Assessment - 11/26/18 1100    Subjective  No falls, no changes.  Shoulder pain comes and goes; still awaiting call for scheduling surgery.    Pertinent History  08/2017 L THR;  05/23/18 L hip dislocation (was put back in without surgery), L RTC tear    Patient Stated Goals  Pt's goal for therapy is to improve balance and get back to where I was one year ago.     Currently in Pain?  No/denies    Pain Onset  More than a month ago       Neuro Re-education:   Pt performs PWR! Moves in modified quadruped position, standing at [employer], x 10 reps each   PWR! Up for improved posture-cues to push into dresser, push away and hold upright posture position x 3 sec  PWR! Rock for improved weighshifting- cues for controlled anterior/posterior weightshifting to achieve dynamic stretch through hamstrings, gastrocs  PWR! Twist for improved trunk rotation-cues for controlled trunk rotation  PWR! Step for improved step initiation-cues for forward/diagonal step pattern   Verbal and visual Cues provided for technique, control of movement patterns.   -Hip/ankle strategy work with heel/toe raises, 2 sets x 15 reps, with added hip work anterior/posterior direction; progressed to stagger stance position of feet with forward/back rocking to achieve unilateral foot push-off on posterior foot, x 10 reps each leg.  -Step strategy work:  Side step and weightshift x 10 reps, then back step and weightshift x 10 reps, with UE support at dresser-cues for controlled step and return for improved balance.  Progressed to variable direction stepping with PT calling out varied directions, pt requiring intermittent UE support for  stability with changes of directions.  -Four-square step activity:  Forward>side>back>side (box step) then reverse directions, x 5 reps; last 2 reps, PT provided cues for increased step height, as if to step over obstacle  -Practiced turning technique:  360 and 180 degree turns, walking turns, including wide BOS weightshifting turns, marching turns, clock/quarter turns (for step and go from stopped position), and wide walking figure-8 turns.  PT provides cues, and pt able to  trial/return demo in his space at home.  (provided these options for turning practice as part of HEP at home)                            PT Education - 11/26/18 1101    Education Details  Turning techniques, additions to HEP (Modified quadruped) PWR! Moves    Person(s) Educated  Patient    Methods  Explanation;Demonstration;Handout   handout to be mailed to pt   Comprehension  Verbalized understanding;Returned demonstration;Verbal cues required       PT Short Term Goals - 11/19/18 1631      PT SHORT TERM GOAL #1   Title  Pt will be independent with progression of HEP for balance, strength, gait.  TARGET 12/11/2018    Time  3    Period  Weeks    Status  New    Target Date  12/11/18      PT SHORT TERM GOAL #2   Title  Pt will perform floor to stand transfer, modified independently, for safe fall recovery.    Time  3    Period  Weeks    Status  New    Target Date  12/11/18        PT Long Term Goals - 11/19/18 1616      PT LONG TERM GOAL #1   Title  Pt will verbalize plans for continued community fitness upon d/c from PT.    Time  6    Period  Weeks    Status  New    Target Date  01/01/19      PT LONG TERM GOAL #2   Title  Pt will perform 1/4 and 1/2 turns R and L directions, at least 4 of 5 trials within a session, without LOB, for improved transitional movement.s    Time  6    Period  Weeks    Status  New    Target Date  01/01/19      PT LONG TERM GOAL #3   Title  Pt will improve MiniBESTest score to at least 16/28 for decreased fall risk.    Time  6    Period  Weeks    Status  New    Target Date  01/01/19      PT LONG TERM GOAL #4   Title  Pt will verbalize understanding of local Parkinson's disease-related resources.    Time  6    Period  Weeks    Status  New    Target Date  01/01/19      PT LONG TERM GOAL #5   Title       Time  --    Period  --    Status  --      PT LONG TERM GOAL #6   Title  --    Time  4    Period  --     Status  --  Plan - 11/26/18 1117    Clinical Impression Statement  Skilled telehealth PT session today focused on varied direction stepping, hip/ankle strategies for balance, and turning practice.  Pt needs to stay close to surface for intermittent UE support throughout session.  Cues provided for smooth, controlled movement patterns to maintain balance throughout changes of direction.  Pt will continue to benefit from skilled PT to address balance and gait for improved overall mobility.    Rehab Potential  Good    PT Frequency  1x / week    PT Duration  6 weeks   per recert 01/26/210   PT Treatment/Interventions  ADLs/Self Care Home Management;Therapeutic exercise;Therapeutic activities;Functional mobility training;Gait training;DME Instruction;Balance training;Neuromuscular re-education;Patient/family education;Orthotic Fit/Training    PT Next Visit Plan  Review updates to HEP; hip/ankle strategy work, turns, four-square step-like activity, change of direction, variable direction stepping; SLS    Consulted and Agree with Plan of Care  Patient       Patient will benefit from skilled therapeutic intervention in order to improve the following deficits and impairments:  Abnormal gait, Decreased balance, Decreased knowledge of precautions, Decreased safety awareness, Decreased mobility, Decreased strength, Difficulty walking, Postural dysfunction  Visit Diagnosis: Unsteadiness on feet  Other abnormalities of gait and mobility     Problem List There are no active problems to display for this patient.   Fey Coghill W. 11/26/2018, 11:20 AM  Frazier Butt., PT  [facility] [address] [place], [place], [address] Phone: [phone]   Fax:  [phone]  Name: Jim Carson MRN: [id number] Date of Birth: 1951-04-23

## 2018-11-26 NOTE — Patient Instructions (Addendum)
Figure Eight    Walk in a figure eight pattern.  Make sure to make space for wide, walking turn. Repeat __3__ times per session. Do _1___ session  per day. (This turn is for when you have a large area, and the turn becomes more of a walking turn)  Copyright  [employer]. All rights reserved.  Turning in Place: Solid Surface    Standing in place, lead with head and turn slowly making quarter turns toward left. (THINK:  Side step and turn to 3:00, 6:00, 9:00, 12:00; "clock turns").   Repeat __3__ times per session, each direction. Do _1-2___ sessions per day.   Copyright  [employer]. All rights reserved.    You can also do marching turns or weigthshifting turns, making sure to lift each leg off the ground.  Try NOT to pivot, keeping one leg in place during the turn.  Also provided handout for Modified Quadruped PWR! Moves  Also provided handout for multi-directional/varied stepping

## 2018-12-03 ENCOUNTER — Ambulatory Visit: Payer: Medicare Other | Attending: Neurology | Admitting: Physical Therapy

## 2018-12-03 ENCOUNTER — Other Ambulatory Visit: Payer: Self-pay

## 2018-12-03 DIAGNOSIS — R2681 Unsteadiness on feet: Secondary | ICD-10-CM | POA: Diagnosis present

## 2018-12-03 DIAGNOSIS — R2689 Other abnormalities of gait and mobility: Secondary | ICD-10-CM | POA: Diagnosis present

## 2018-12-04 ENCOUNTER — Encounter: Payer: Self-pay | Admitting: Physical Therapy

## 2018-12-04 NOTE — Therapy (Signed)
[facility] [address] [place], [place], [address] Phone: [phone]   Fax:  [phone]  Physical Therapy Treatment  Patient Details  Name: Jim Carson MRN: [id number] Date of Birth: [birth year] Referring Provider (PT): Jennelle Human, MD   Encounter Date: 12/03/2018  Physical Therapy Telehealth Visit:  I connected with Jim Carson today at 1406 by Webex video conference and verified that I am speaking with the correct person using two identifiers.  I discussed the limitations, risks, security and privacy concerns of performing an evaluation and management service by Webex and the availability of in person appointments.  I also discussed with the patient that there may be a patient responsible charge related to this service. The patient expressed understanding and agreed to proceed.    The patient's address was confirmed.  Identified to the patient that therapist is a licensed physical therapist in the state of [place].  Verified phone # in chart as phone number to call in case of technical difficulties.     PT End of Session - 12/04/18 0825    Visit Number  9    Number of Visits  13    Date for PT Re-Evaluation  02/63/78   per recert 5/88/5027   Authorization Type  Medicare and [place] (covered 100%)-will need 10th visit progress notes    PT Start Time  1406    PT Stop Time  1450    PT Time Calculation (min)  44 min    Activity Tolerance  Patient tolerated treatment well    Behavior During Therapy  WFL for tasks assessed/performed       History reviewed. No pertinent past medical history.  Past Surgical History:  Procedure Laterality Date  . 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  Subjective Assessment - 12/04/18 0823    Subjective  Received the new exercise packet in the mail yesterday.  Have tried them.  Scheduled for shoulder surgery on 12/15/2018.     Pertinent History  08/2017 L THR; 05/23/18 L hip dislocation (was put back in without surgery), L RTC tear    Patient Stated Goals  Pt's goal for therapy is to improve balance and get back to where I was one year ago.     Currently in Pain?  Yes    Pain Score  3     Pain Location  Shoulder    Pain Orientation  Left    Pain Descriptors / Indicators  Aching    Pain Type  Chronic pain    Pain Onset  More than a month ago    Pain Frequency  Intermittent    Aggravating Factors   sleeping, driving, malpositioning    Pain Relieving Factors  being in recliner    Effect of Pain on Daily Activities  shoulder surgery scheduled for 12/15/2018           Neuro Re-education-Reviewed HEP given last week, with occasional cues for correct technique, controlled movement pattern   Pt performs PWR! Moves in modified quadruped position, standing at [employer], x 10 reps each   PWR! Up for improved posture-cues to push into dresser, push away and hold upright posture position x 3 sec  PWR! Rock for improved weighshifting- cues for controlled anterior/posterior weightshifting to achieve dynamic stretch through hamstrings, gastrocs  PWR! Twist for improved trunk rotation-cues for controlled trunk rotation  PWR! Step for improved step initiation-cues for forward/diagonal step pattern  Reviewed variable direction stepping with PT calling out varied directions, pt requiring intermittent UE support for stability with changes of directions.  (pt has handout provided from HEP last visit to perform at home)  -Four-square step activity:  Forward>side>back>side (box step) then reverse directions, x 3 reps  -Practiced turning technique:  walking turns, including wide BOS weightshifting turns, marching turns, and wide walking figure-8 turns.  Pt feels the wide BOS weightshifting turns are most stable for him.  PT provides cues, and pt able to return demo in his space at home.       Self Care: Discussed  fall prevention, PD medications, in relations to upcoming surgery, that wife will likely need to provide supervision for patient after surgery, since he will have arm in sling.  Recommended consistent cane use in RUE after surgery.  Discussed local Parkinson's disease resources, including [employer] community group and caregiver support group (as pt asked if wife could attend).  He agrees to allow PT to add him to [employer] email group list.  -Discussed option of ankle brace for stability on R ankle if needed, given pt's reports of instability at R ankle   Therapeutic Exercise: Practiced sit to stand, 5 reps, 2 sets from chair, no UE support (simulating L arm in sling)-no LOB noted  -Attempted SLS 2 reps 10 seconds, with UE support (pt c/o R ankle unsteadiness)  -Seated ankle dorsiflexion, eversion, x 5 reps.  Discussed use of theraband (pt has done this in the past to resist eversion); cues to keep hip at neutral                      PT Education - 12/04/18 0824    Education Details  Review of HEP; discussed activity modifications/safety/fall prevention given pt's upcoming shoulder surgery    Person(s) Educated  Patient    Methods  Explanation    Comprehension  Verbalized understanding       PT Short Term Goals - 11/19/18 1631      PT SHORT TERM GOAL #1   Title  Pt will be independent with progression of HEP for balance, strength, gait.  TARGET 12/11/2018    Time  3    Period  Weeks    Status  New    Target Date  12/11/18      PT SHORT TERM GOAL #2   Title  Pt will perform floor to stand transfer, modified independently, for safe fall recovery.    Time  3    Period  Weeks    Status  New    Target Date  12/11/18        PT Long Term Goals - 11/19/18 1616      PT LONG TERM GOAL #1   Title  Pt will verbalize plans for continued community fitness upon d/c from PT.    Time  6    Period  Weeks    Status  New    Target Date   01/01/19      PT LONG TERM GOAL #2   Title  Pt will perform 1/4 and 1/2 turns R and L directions, at least 4 of 5 trials within a session, without LOB, for improved transitional movement.s    Time  6    Period  Weeks    Status  New    Target Date  01/01/19      PT LONG TERM GOAL #3   Title  Pt will  improve MiniBESTest score to at least 16/28 for decreased fall risk.    Time  6    Period  Weeks    Status  New    Target Date  01/01/19      PT LONG TERM GOAL #4   Title  Pt will verbalize understanding of local Parkinson's disease-related resources.    Time  6    Period  Weeks    Status  New    Target Date  01/01/19      PT LONG TERM GOAL #5   Title       Time  --    Period  --    Status  --      PT LONG TERM GOAL #6   Title  --    Time  4    Period  --    Status  --            Plan - 12/04/18 0825    Clinical Impression Statement  Skilled telehealth PT session today focused on review of recent additions to HEP-working on turning practice, varied direction stepping.  Pt return demo understanding with minimal cues for technique.  Attempted SLS activities, but pt reports R ankle wanting to roll (has been reporting this for past few sessions).  Discussed general ankle strengthening(he has done this in the past) and possibility of ankle brace for stability.  Pt is planning to have L shoulder surgery on 12/15/2018, and this PT will plan for d/c from PT next visit.      Rehab Potential  Good    PT Frequency  1x / week    PT Duration  6 weeks   per recert 1/63/8466   PT Treatment/Interventions  ADLs/Self Care Home Management;Therapeutic exercise;Therapeutic activities;Functional mobility training;Gait training;DME Instruction;Balance training;Neuromuscular re-education;Patient/family education;Orthotic Fit/Training    PT Next Visit Plan  [place]view any questions for HEP, transfer safety, check LTGs; discuss POC and plan for d/c    Consulted and Agree with Plan of Care  Patient        Patient will benefit from skilled therapeutic intervention in order to improve the following deficits and impairments:  Abnormal gait, Decreased balance, Decreased knowledge of precautions, Decreased safety awareness, Decreased mobility, Decreased strength, Difficulty walking, Postural dysfunction  Visit Diagnosis: Unsteadiness on feet  Other abnormalities of gait and mobility     Problem List There are no active problems to display for this patient.   Frazier Butt. 12/04/2018, 8:29 AM  Frazier Butt., PT   [facility] [address] [place], [place], [address] Phone: [phone]   Fax:  [phone]  Name: Jim Carson MRN: [id number] Date of Birth: 08/12/1950

## 2018-12-10 ENCOUNTER — Ambulatory Visit: Payer: Medicare Other | Admitting: Physical Therapy

## 2018-12-10 ENCOUNTER — Other Ambulatory Visit: Payer: Self-pay

## 2018-12-10 DIAGNOSIS — R2689 Other abnormalities of gait and mobility: Secondary | ICD-10-CM

## 2018-12-10 DIAGNOSIS — R2681 Unsteadiness on feet: Secondary | ICD-10-CM | POA: Diagnosis not present

## 2018-12-10 NOTE — Therapy (Signed)
[facility] [address] [place], [place], [address] Phone: [phone]   Fax:  [phone]  Physical Therapy Treatment  Patient Details  Name: Jim Carson MRN: [id number] Date of Birth: [birth year] Referring Provider (PT): Jennelle Human, MD   Encounter Date: 12/10/2018  Physical Therapy Telehealth Visit:  I connected with Jim Carson today at 1400 by Webex video conference and verified that I am speaking with the correct person using two identifiers.  I discussed the limitations, risks, security and privacy concerns of performing an evaluation and management service by Webex and the availability of in person appointments.  I also discussed with the patient that there may be a patient responsible charge related to this service. The patient expressed understanding and agreed to proceed.    The patient's address was confirmed.  Identified to the patient that therapist is a licensed physical therapist in the state of [place].  Verified phone # as [phone] to call in case of technical difficulties.    PT End of Session - 12/10/18 1446    Visit Number  10    Number of Visits  13    Date for PT Re-Evaluation  79/03/83   per recert 3/38/3291   Authorization Type  Medicare and [place] (covered 100%)-will need 10th visit progress notes    PT Start Time  1404    PT Stop Time  1438   d/c visit-goals checked and pt d/c'd   PT Time Calculation (min)  34 min    Activity Tolerance  Patient tolerated treatment well    Behavior During Therapy  [facility] for tasks assessed/performed       No past medical history on file.  Past Surgical History:  Procedure Laterality Date  . 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                    [facility] Adult PT Treatment/Exercise - 12/10/18 0001      Transfers   Transfers  Sit to Stand;Stand to Sit    Sit to Stand  6: Modified  independent (Device/Increase time);Without upper extremity assist;From chair/3-in-1    Stand to Sit  6: Modified independent (Device/Increase time);Without upper extremity assist;To chair/3-in-1      Standardized Balance Assessment   Standardized Balance Assessment  Timed Up and Go Test      Timed Up and Go Test   TUG  Normal TUG;Cognitive TUG   performed at home, approx 10 ft   Normal TUG (seconds)  11.44    Cognitive TUG (seconds)  12.66      High Level Balance   High Level Balance Activities  Turns    High Level Balance Comments  Reviewed turns-pt performs 1/4 and 1/2 turns R and L, 5 reps each, only one minor LOB.  MiniBESTest (performed via telehealth-unable to perform #8,9, 13), but pt improved score from 12/28 to 15/28      Self-Care   Self-Care  Other Self-Care Comments    Other Self-Care Comments   Discussed progress towards goals, POC, plans for d/c today given pt's upcoming shoulder surgery next week.  Reviewed techniques for turning, discussed optimal fitness for people with PD.  Provided informaion on [facility] PD Cycling class (when it reopens) for patient to check into (after healing from surgery).             PT Education - 12/10/18 1445  Education Details  Progress towards goals, POC and discharge today; discussed optimal PD fitness and potential option for [employer] PD cycle class    Person(s) Educated  Patient    Methods  Explanation;Handout   emailed handout on optimal PD fitness   Comprehension  Verbalized understanding       PT Short Term Goals - 11/19/18 1631      PT SHORT TERM GOAL #1   Title  Pt will be independent with progression of HEP for balance, strength, gait.  TARGET 12/11/2018    Time  3    Period  Weeks    Status  New    Target Date  12/11/18      PT SHORT TERM GOAL #2   Title  Pt will perform floor to stand transfer, modified independently, for safe fall recovery.    Time  3    Period  Weeks    Status  New    Target Date   12/11/18        PT Long Term Goals - 12/10/18 1411      PT LONG TERM GOAL #1   Title  Pt will verbalize plans for continued community fitness upon d/c from PT.    Time  6    Period  Weeks    Status  Achieved      PT LONG TERM GOAL #2   Title  Pt will perform 1/4 and 1/2 turns R and L directions, at least 4 of 5 trials within a session, without LOB, for improved transitional movement.s    Time  6    Period  Weeks    Status  Achieved      PT LONG TERM GOAL #3   Title  Pt will improve MiniBESTest score to at least 16/28 for decreased fall risk.    Baseline  15/28 at d/c-improved from 12/28    Time  6    Period  Weeks    Status  Partially Met      PT LONG TERM GOAL #4   Title  Pt will verbalize understanding of local Parkinson's disease-related resources.    Time  6    Period  Weeks    Status  Achieved      PT LONG TERM GOAL #5   Title         PT LONG TERM GOAL #6   Time  4            Plan - 12/10/18 1448    Clinical Impression Statement  LTGs assessed this visit, with pt meeting LTG 1, 2, 4.  LTG 3 for MiniBESTest partially met, with pt improving MiniBESTest score to 15/28 (despite performing via telehealth and not being able to perform 3 items).  Pt has demonstrated improved efficiency with transfers, improved balance and gait throughout course of therapy.  He is appropriate for d/c from PT at this time.    Rehab Potential  Good    PT Frequency  1x / week    PT Duration  6 weeks   per recert 08/29/6008   PT Treatment/Interventions  ADLs/Self Care Home Management;Therapeutic exercise;Therapeutic activities;Functional mobility training;Gait training;DME Instruction;Balance training;Neuromuscular re-education;Patient/family education;Orthotic Fit/Training    PT Next Visit Plan  D/C from PT this visit.  Pt agreeable to return PT, OT, speech screens in 6-9 months    Consulted and Agree with Plan of Care  Patient       Patient will benefit from skilled therapeutic  intervention in order  to improve the following deficits and impairments:  Abnormal gait, Decreased balance, Decreased knowledge of precautions, Decreased safety awareness, Decreased mobility, Decreased strength, Difficulty walking, Postural dysfunction  Visit Diagnosis: Other abnormalities of gait and mobility  Unsteadiness on feet     Problem List There are no active problems to display for this patient.   MARRIOTT,AMY W. 12/10/2018, 2:51 PM  Frazier Butt., PT   [facility] [address] [place], [place], [address] Phone: [phone]   Fax:  [phone]  Name: Natividad Halls MRN: [id number] Date of Birth: 04-07-1951   PHYSICAL THERAPY DISCHARGE SUMMARY  Visits from Start of Care: 10  Current functional level related to goals / functional outcomes: PT Long Term Goals - 12/10/18 1411      PT LONG TERM GOAL #1   Title  Pt will verbalize plans for continued community fitness upon d/c from PT.    Time  6    Period  Weeks    Status  Achieved      PT LONG TERM GOAL #2   Title  Pt will perform 1/4 and 1/2 turns R and L directions, at least 4 of 5 trials within a session, without LOB, for improved transitional movement.s    Time  6    Period  Weeks    Status  Achieved      PT LONG TERM GOAL #3   Title  Pt will improve MiniBESTest score to at least 16/28 for decreased fall risk.    Baseline  15/28 at d/c-improved from 12/28    Time  6    Period  Weeks    Status  Partially Met      PT LONG TERM GOAL #4   Title  Pt will verbalize understanding of local Parkinson's disease-related resources.    Time  6    Period  Weeks    Status  Achieved      PT LONG TERM GOAL #5   Title         PT LONG TERM GOAL #6   Time  4      Pt has met all LTGs.   Remaining deficits: Balance with turns, quick change of directions (has improved)   Education / Equipment: Educated in [employer], fall prevention, Parkinson's disease  resources, and ongoing fitness options.  Plan: Patient agrees to discharge.  Patient goals were met. Patient is being discharged due to meeting the stated rehab goals.  ?????Recommend (and pt agreeable to) return PD screens (PT, OT, speech) in 6-9 months due to progressive nature of disease process.         Mady Haagensen, PT 12/10/18 2:54 PM Phone: [phone] Fax: [phone]

## 2018-12-10 NOTE — Patient Instructions (Signed)

## 2018-12-11 ENCOUNTER — Other Ambulatory Visit: Payer: Self-pay

## 2018-12-11 ENCOUNTER — Encounter (HOSPITAL_BASED_OUTPATIENT_CLINIC_OR_DEPARTMENT_OTHER): Payer: Self-pay | Admitting: *Deleted

## 2018-12-11 ENCOUNTER — Other Ambulatory Visit (HOSPITAL_COMMUNITY)
Admission: RE | Admit: 2018-12-11 | Discharge: 2018-12-11 | Disposition: A | Discharge status: Home / Self Care | Payer: Medicare Other | Source: Ambulatory Visit | Attending: Specialist | Admitting: Specialist

## 2018-12-11 DIAGNOSIS — Z01818 Encounter for other preprocedural examination: Secondary | ICD-10-CM | POA: Diagnosis present

## 2018-12-11 DIAGNOSIS — Z1159 Encounter for screening for other viral diseases: Secondary | ICD-10-CM | POA: Insufficient documentation

## 2018-12-11 DIAGNOSIS — R001 Bradycardia, unspecified: Secondary | ICD-10-CM | POA: Insufficient documentation

## 2018-12-11 NOTE — Progress Notes (Signed)
Spoke w/ pt via phone for pre-op interview.  Npo after mn.  Arrive at 1015.  Needs cbc, bmp, and ekg.  Previous ekg in care everywhere dated 01-17-2017.   Pt had covid test done today.  Will take sinemet, prilosec, synthroid, and colace am dos w/ sips of water.  Pt asked to bring cpap mask and tubing.

## 2018-12-11 NOTE — Progress Notes (Signed)

## 2018-12-12 LAB — NOVEL CORONAVIRUS, NAA (HOSP ORDER, SEND-OUT TO REF LAB; TAT 18-24 HRS): SARS-CoV-2, NAA: NOT DETECTED

## 2018-12-14 NOTE — Progress Notes (Signed)
SPOKE W/  _Robert     SCREENING SYMPTOMS OF COVID 19:   COUGH--NO  RUNNY NOSE--- NO  SORE THROAT---NO  NASAL CONGESTION----NO  SNEEZING----NO  SHORTNESS OF BREATH---NO  DIFFICULTY BREATHING---NO TEMP >100.0 -----NO UNEXPLAINED BODY ACHES-----NO-  CHILLS -------- NO  HEADACHES ---------NO  LOSS OF SMELL/ TASTE --------NO    HAVE YOU OR ANY FAMILY MEMBER TRAVELLED PAST 14 DAYS OUT OF THE   COUNTY---NO STATE----NO COUNTRY----NO  HAVE YOU OR ANY FAMILY MEMBER BEEN EXPOSED TO ANYONE WITH COVID 19?   NO

## 2018-12-15 ENCOUNTER — Encounter (HOSPITAL_BASED_OUTPATIENT_CLINIC_OR_DEPARTMENT_OTHER)
Admission: RE | Disposition: A | Discharge status: Home / Self Care | Payer: Self-pay | Source: Home / Self Care | Attending: Specialist

## 2018-12-15 ENCOUNTER — Other Ambulatory Visit: Payer: Self-pay

## 2018-12-15 ENCOUNTER — Ambulatory Visit (HOSPITAL_BASED_OUTPATIENT_CLINIC_OR_DEPARTMENT_OTHER): Payer: Medicare Other | Admitting: Anesthesiology

## 2018-12-15 ENCOUNTER — Ambulatory Visit (HOSPITAL_BASED_OUTPATIENT_CLINIC_OR_DEPARTMENT_OTHER)
Admission: RE | Admit: 2018-12-15 | Discharge: 2018-12-15 | Disposition: A | Discharge status: Home / Self Care | Payer: Medicare Other | Attending: Specialist | Admitting: Specialist

## 2018-12-15 ENCOUNTER — Encounter (HOSPITAL_BASED_OUTPATIENT_CLINIC_OR_DEPARTMENT_OTHER): Payer: Self-pay

## 2018-12-15 DIAGNOSIS — K219 Gastro-esophageal reflux disease without esophagitis: Secondary | ICD-10-CM | POA: Insufficient documentation

## 2018-12-15 DIAGNOSIS — I10 Essential (primary) hypertension: Secondary | ICD-10-CM | POA: Diagnosis not present

## 2018-12-15 DIAGNOSIS — G4733 Obstructive sleep apnea (adult) (pediatric): Secondary | ICD-10-CM | POA: Insufficient documentation

## 2018-12-15 DIAGNOSIS — S43432A Superior glenoid labrum lesion of left shoulder, initial encounter: Secondary | ICD-10-CM | POA: Diagnosis not present

## 2018-12-15 DIAGNOSIS — X58XXXA Exposure to other specified factors, initial encounter: Secondary | ICD-10-CM | POA: Diagnosis not present

## 2018-12-15 DIAGNOSIS — E559 Vitamin D deficiency, unspecified: Secondary | ICD-10-CM | POA: Diagnosis not present

## 2018-12-15 DIAGNOSIS — Z79890 Hormone replacement therapy (postmenopausal): Secondary | ICD-10-CM | POA: Diagnosis not present

## 2018-12-15 DIAGNOSIS — Z96642 Presence of left artificial hip joint: Secondary | ICD-10-CM | POA: Insufficient documentation

## 2018-12-15 DIAGNOSIS — M75102 Unspecified rotator cuff tear or rupture of left shoulder, not specified as traumatic: Secondary | ICD-10-CM | POA: Diagnosis present

## 2018-12-15 DIAGNOSIS — K5909 Other constipation: Secondary | ICD-10-CM | POA: Diagnosis not present

## 2018-12-15 DIAGNOSIS — E538 Deficiency of other specified B group vitamins: Secondary | ICD-10-CM | POA: Diagnosis not present

## 2018-12-15 DIAGNOSIS — G20 Parkinson's disease: Secondary | ICD-10-CM | POA: Diagnosis not present

## 2018-12-15 DIAGNOSIS — M1990 Unspecified osteoarthritis, unspecified site: Secondary | ICD-10-CM | POA: Insufficient documentation

## 2018-12-15 DIAGNOSIS — Z79899 Other long term (current) drug therapy: Secondary | ICD-10-CM | POA: Diagnosis not present

## 2018-12-15 DIAGNOSIS — M7542 Impingement syndrome of left shoulder: Secondary | ICD-10-CM | POA: Insufficient documentation

## 2018-12-15 DIAGNOSIS — Z7982 Long term (current) use of aspirin: Secondary | ICD-10-CM | POA: Diagnosis not present

## 2018-12-15 DIAGNOSIS — E039 Hypothyroidism, unspecified: Secondary | ICD-10-CM | POA: Insufficient documentation

## 2018-12-15 HISTORY — DX: Parkinson's disease: G20

## 2018-12-15 HISTORY — DX: Deviated nasal septum: J34.2

## 2018-12-15 HISTORY — DX: Obstructive sleep apnea (adult) (pediatric): G47.33

## 2018-12-15 HISTORY — DX: Unspecified osteoarthritis, unspecified site: M19.90

## 2018-12-15 HISTORY — DX: Presence of spectacles and contact lenses: Z97.3

## 2018-12-15 HISTORY — DX: Gastro-esophageal reflux disease without esophagitis: K21.9

## 2018-12-15 HISTORY — DX: Essential (primary) hypertension: I10

## 2018-12-15 HISTORY — DX: Vitamin D deficiency, unspecified: E55.9

## 2018-12-15 HISTORY — DX: Pain in left shoulder: M25.512

## 2018-12-15 HISTORY — DX: Unspecified abnormalities of gait and mobility: R26.9

## 2018-12-15 HISTORY — DX: Allergic rhinitis, unspecified: J30.9

## 2018-12-15 HISTORY — DX: Deficiency of other specified B group vitamins: E53.8

## 2018-12-15 HISTORY — DX: Hypothyroidism, unspecified: E03.9

## 2018-12-15 HISTORY — DX: Personal history of traumatic brain injury: Z87.820

## 2018-12-15 HISTORY — PX: SHOULDER ARTHROSCOPY WITH ROTATOR CUFF REPAIR: SHX5685

## 2018-12-15 HISTORY — DX: Other constipation: K59.09

## 2018-12-15 HISTORY — DX: Parkinson's disease without dyskinesia, without mention of fluctuations: G20.A1

## 2018-12-15 LAB — BASIC METABOLIC PANEL
Anion gap: 12 (ref 5–15)
BUN: 19 mg/dL (ref 8–23)
CO2: 25 mmol/L (ref 22–32)
Calcium: 9.2 mg/dL (ref 8.9–10.3)
Chloride: 103 mmol/L (ref 98–111)
Creatinine, Ser: 0.83 mg/dL (ref 0.61–1.24)
GFR calc Af Amer: >60 mL/min (ref >60)
GFR calc non Af Amer: >60 mL/min (ref >60)
Glucose, Bld: 90 mg/dL (ref 70–99)
Potassium: 3.7 mmol/L (ref 3.5–5.1)
Sodium: 140 mmol/L (ref 135–145)

## 2018-12-15 LAB — CBC
HCT: 40.1 % (ref 39.0–52.0)
Hemoglobin: 13.2 g/dL (ref 13.0–17.0)
MCH: 29.5 pg (ref 26.0–34.0)
MCHC: 32.9 g/dL (ref 30.0–36.0)
MCV: 89.5 fL (ref 80.0–100.0)
Platelets: 142 10*3/uL — ABNORMAL LOW (ref 150–400)
RBC: 4.48 MIL/uL (ref 4.22–5.81)
RDW: 12.8 % (ref 11.5–15.5)
WBC: 5.8 10*3/uL (ref 4.0–10.5)
nRBC: 0 % (ref 0.0–0.2)

## 2018-12-15 SURGERY — ARTHROSCOPY, SHOULDER, WITH ROTATOR CUFF REPAIR
Anesthesia: General | Site: Shoulder | Laterality: Left

## 2018-12-15 MED ORDER — FENTANYL CITRATE (PF) 100 MCG/2ML IJ SOLN
INTRAMUSCULAR | Status: AC
  Filled 2018-12-15: qty 2

## 2018-12-15 MED ORDER — CEFAZOLIN SODIUM-DEXTROSE 2-4 GM/100ML-% IV SOLN
2.0000 g | INTRAVENOUS | Status: AC
  Administered 2018-12-15: 2 g via INTRAVENOUS
  Filled 2018-12-15: qty 100

## 2018-12-15 MED ORDER — FENTANYL CITRATE (PF) 100 MCG/2ML IJ SOLN
100.0000 ug | Freq: Once | INTRAMUSCULAR | Status: AC
  Administered 2018-12-15: 100 ug via INTRAVENOUS
  Filled 2018-12-15: qty 2

## 2018-12-15 MED ORDER — PROPOFOL 10 MG/ML IV BOLUS
INTRAVENOUS | Status: AC
  Filled 2018-12-15: qty 20

## 2018-12-15 MED ORDER — MIDAZOLAM HCL 2 MG/2ML IJ SOLN
INTRAMUSCULAR | Status: AC
  Filled 2018-12-15: qty 2

## 2018-12-15 MED ORDER — LACTATED RINGERS IV SOLN
INTRAVENOUS | Status: DC
  Administered 2018-12-15: 12:00:00 via INTRAVENOUS
  Filled 2018-12-15: qty 1000

## 2018-12-15 MED ORDER — ROCURONIUM BROMIDE 10 MG/ML (PF) SYRINGE
PREFILLED_SYRINGE | INTRAVENOUS | Status: DC | PRN
  Administered 2018-12-15: 10 mg via INTRAVENOUS
  Administered 2018-12-15: 40 mg via INTRAVENOUS

## 2018-12-15 MED ORDER — POVIDONE-IODINE 7.5 % EX SOLN
Freq: Once | CUTANEOUS | Status: DC
  Filled 2018-12-15: qty 118

## 2018-12-15 MED ORDER — BUPIVACAINE-EPINEPHRINE (PF) 0.5% -1:200000 IJ SOLN: INTRAMUSCULAR | Status: DC | PRN

## 2018-12-15 MED ORDER — BUPIVACAINE LIPOSOME 1.3 % IJ SUSP
INTRAMUSCULAR | Status: DC | PRN
  Administered 2018-12-15 (×5): 2 mL via PERINEURAL

## 2018-12-15 MED ORDER — KETOROLAC TROMETHAMINE 30 MG/ML IJ SOLN
30.0000 mg | Freq: Once | INTRAMUSCULAR | Status: DC | PRN
  Filled 2018-12-15: qty 1

## 2018-12-15 MED ORDER — PROMETHAZINE HCL 25 MG/ML IJ SOLN
6.2500 mg | INTRAMUSCULAR | Status: DC | PRN
  Filled 2018-12-15: qty 1

## 2018-12-15 MED ORDER — PROPOFOL 10 MG/ML IV BOLUS
INTRAVENOUS | Status: DC | PRN
  Administered 2018-12-15: 200 mg via INTRAVENOUS

## 2018-12-15 MED ORDER — SODIUM CHLORIDE (PF) 0.9 % IJ SOLN
INTRAMUSCULAR | Status: DC | PRN
  Administered 2018-12-15: 22 mL

## 2018-12-15 MED ORDER — HYDROCODONE-ACETAMINOPHEN 7.5-325 MG PO TABS
1.0000 | ORAL_TABLET | Freq: Once | ORAL | Status: DC | PRN
  Filled 2018-12-15: qty 1

## 2018-12-15 MED ORDER — IBUPROFEN 200 MG PO TABS
200.0000 mg | ORAL_TABLET | Freq: Four times a day (QID) | ORAL | Status: DC | PRN
  Filled 2018-12-15: qty 2

## 2018-12-15 MED ORDER — ONDANSETRON HCL 4 MG/2ML IJ SOLN
INTRAMUSCULAR | Status: AC
  Filled 2018-12-15: qty 2

## 2018-12-15 MED ORDER — SODIUM CHLORIDE 0.9 % IR SOLN
Status: DC | PRN
  Administered 2018-12-15 (×6): 3000 mL

## 2018-12-15 MED ORDER — LIDOCAINE 2% (20 MG/ML) 5 ML SYRINGE
INTRAMUSCULAR | Status: DC | PRN
  Administered 2018-12-15: 100 mg via INTRAVENOUS

## 2018-12-15 MED ORDER — ONDANSETRON HCL 4 MG/2ML IJ SOLN
INTRAMUSCULAR | Status: DC | PRN
  Administered 2018-12-15: 4 mg via INTRAVENOUS

## 2018-12-15 MED ORDER — SUGAMMADEX SODIUM 200 MG/2ML IV SOLN
INTRAVENOUS | Status: DC | PRN
  Administered 2018-12-15: 190 mg via INTRAVENOUS

## 2018-12-15 MED ORDER — SUGAMMADEX SODIUM 200 MG/2ML IV SOLN
INTRAVENOUS | Status: AC
  Filled 2018-12-15: qty 2

## 2018-12-15 MED ORDER — LIDOCAINE 2% (20 MG/ML) 5 ML SYRINGE
INTRAMUSCULAR | Status: AC
  Filled 2018-12-15: qty 5

## 2018-12-15 MED ORDER — DEXAMETHASONE SODIUM PHOSPHATE 10 MG/ML IJ SOLN
INTRAMUSCULAR | Status: DC | PRN
  Administered 2018-12-15: 5 mg via INTRAVENOUS

## 2018-12-15 MED ORDER — MIDAZOLAM HCL 2 MG/2ML IJ SOLN
2.0000 mg | Freq: Once | INTRAMUSCULAR | Status: AC
  Administered 2018-12-15: 2 mg via INTRAVENOUS
  Filled 2018-12-15: qty 2

## 2018-12-15 MED ORDER — DEXAMETHASONE SODIUM PHOSPHATE 10 MG/ML IJ SOLN
INTRAMUSCULAR | Status: AC
  Filled 2018-12-15: qty 1

## 2018-12-15 MED ORDER — FENTANYL CITRATE (PF) 100 MCG/2ML IJ SOLN
25.0000 ug | INTRAMUSCULAR | Status: DC | PRN
  Filled 2018-12-15: qty 1

## 2018-12-15 MED ORDER — CEFAZOLIN SODIUM-DEXTROSE 2-4 GM/100ML-% IV SOLN
INTRAVENOUS | Status: AC
  Filled 2018-12-15: qty 100

## 2018-12-15 MED ORDER — BUPIVACAINE-EPINEPHRINE (PF) 0.5% -1:200000 IJ SOLN
INTRAMUSCULAR | Status: DC | PRN
  Administered 2018-12-15 (×5): 3 mL via PERINEURAL

## 2018-12-15 MED ORDER — IBUPROFEN 100 MG/5ML PO SUSP
200.0000 mg | Freq: Four times a day (QID) | ORAL | Status: DC | PRN
  Filled 2018-12-15: qty 30

## 2018-12-15 MED ORDER — MEPERIDINE HCL 25 MG/ML IJ SOLN
6.2500 mg | INTRAMUSCULAR | Status: DC | PRN
  Filled 2018-12-15: qty 1

## 2018-12-15 MED ORDER — FENTANYL CITRATE (PF) 100 MCG/2ML IJ SOLN
INTRAMUSCULAR | Status: DC | PRN
  Administered 2018-12-15: 75 ug via INTRAVENOUS
  Administered 2018-12-15: 25 ug via INTRAVENOUS

## 2018-12-15 MED ORDER — SUCCINYLCHOLINE CHLORIDE 200 MG/10ML IV SOSY
PREFILLED_SYRINGE | INTRAVENOUS | Status: DC | PRN
  Administered 2018-12-15: 140 mg via INTRAVENOUS

## 2018-12-15 SURGICAL SUPPLY — 90 items
ANCH SUT 17.9 PEEK SWLK (Orthopedic Implant) ×2 IMPLANT
BLADE CUDA GRT WHITE 3.5 (BLADE) ×1 IMPLANT
BLADE EXCALIBUR 4.0MM X 13CM (MISCELLANEOUS) ×1
BLADE EXCALIBUR 4.0X13 (MISCELLANEOUS) ×2 IMPLANT
BLADE GREAT WHITE 4.2 (BLADE) ×1 IMPLANT
BLADE GREAT WHITE 4.2MM (BLADE)
BLADE SURG 11 STRL SS (BLADE) ×3 IMPLANT
BLADE SURG 15 STRL LF DISP TIS (BLADE) ×1 IMPLANT
BLADE SURG 15 STRL SS (BLADE) ×3
BURR CLEARCUT OVAL 5.5X13 (MISCELLANEOUS) ×1 IMPLANT
BURR OVAL 12 FL 5.5MM X 13CM (MISCELLANEOUS)
BURR OVAL 12 FL 5.5X13 (MISCELLANEOUS)
BURR OVAL 8 FLU 5.0MM X 13CM (MISCELLANEOUS) ×1
BURR OVAL 8 FLU 5.0X13 (MISCELLANEOUS) ×2 IMPLANT
CANNULA 5.75X7 CRYSTAL CLEAR (CANNULA) ×2 IMPLANT
CANNULA 5.75X71 LONG (CANNULA) IMPLANT
CANNULA TWIST IN 8.25X7CM (CANNULA) ×2 IMPLANT
CONNECTOR 5 IN 1 STRAIGHT STRL (MISCELLANEOUS) ×3 IMPLANT
COVER WAND RF STERILE (DRAPES) ×1 IMPLANT
DECANTER SPIKE VIAL GLASS SM (MISCELLANEOUS) ×3 IMPLANT
DRAPE ORTHO SPLIT 77X108 STRL (DRAPES) ×6
DRAPE POUCH INSTRU U-SHP 10X18 (DRAPES) ×3 IMPLANT
DRAPE SHEET LG 3/4 BI-LAMINATE (DRAPES) ×3 IMPLANT
DRAPE STERI 35X30 U-POUCH (DRAPES) ×3 IMPLANT
DRAPE SURG 17X23 STRL (DRAPES) ×3 IMPLANT
DRAPE SURG ORHT 6 SPLT 77X108 (DRAPES) ×2 IMPLANT
DRAPE U-SHAPE 47X51 STRL (DRAPES) ×3 IMPLANT
DURAPREP 26ML APPLICATOR (WOUND CARE) ×3 IMPLANT
DW OUTFLOW CASSETTE/TUBE SET (MISCELLANEOUS) ×3 IMPLANT
ELECT MENISCUS 165MM 90D (ELECTRODE) IMPLANT
ELECT REM PT RETURN 9FT ADLT (ELECTROSURGICAL) ×3
ELECTRODE REM PT RTRN 9FT ADLT (ELECTROSURGICAL) ×1 IMPLANT
EXCALIBUR 3.8MM X 13CM (MISCELLANEOUS) ×3 IMPLANT
FIBER TAPE 2MM (SUTURE) ×1 IMPLANT
FIBERSTICK 2 (SUTURE) IMPLANT
GAUZE SPONGE 4X4 12PLY STRL (GAUZE/BANDAGES/DRESSINGS) ×3 IMPLANT
GAUZE XEROFORM 1X8 LF (GAUZE/BANDAGES/DRESSINGS) ×3 IMPLANT
GLOVE BIO SURGEON STRL SZ7.5 (GLOVE) ×3 IMPLANT
GLOVE BIO SURGEON STRL SZ8 (GLOVE) ×5 IMPLANT
GLOVE INDICATOR 8.0 STRL GRN (GLOVE) ×6 IMPLANT
GOWN STRL REUS W/TWL XL LVL3 (GOWN DISPOSABLE) ×6 IMPLANT
KIT TURNOVER CYSTO (KITS) ×3 IMPLANT
LASSO SUT 90 DEGREE (SUTURE) IMPLANT
MANIFOLD NEPTUNE II (INSTRUMENTS) ×3 IMPLANT
NDL 1/2 CIR CATGUT .05X1.09 (NEEDLE) IMPLANT
NDL SCORPION MULTI FIRE (NEEDLE) IMPLANT
NEEDLE 1/2 CIR CATGUT .05X1.09 (NEEDLE) IMPLANT
NEEDLE HYPO 22GX1.5 SAFETY (NEEDLE) ×3 IMPLANT
NEEDLE SCORPION MULTI FIRE (NEEDLE) IMPLANT
NS IRRIG 500ML POUR BTL (IV SOLUTION) IMPLANT
PACK ARTHROSCOPY DSU (CUSTOM PROCEDURE TRAY) ×3 IMPLANT
PACK BASIN DAY SURGERY FS (CUSTOM PROCEDURE TRAY) ×3 IMPLANT
PAD ABD 8X10 STRL (GAUZE/BANDAGES/DRESSINGS) ×3 IMPLANT
PAD ARMBOARD 7.5X6 YLW CONV (MISCELLANEOUS) IMPLANT
PASSER SUT SWIFTSTITCH HIP CRT (INSTRUMENTS) ×2 IMPLANT
PEEK SWIVELOCK SHOU 3.9 (Orthopedic Implant) ×4 IMPLANT
PENCIL BUTTON HOLSTER BLD 10FT (ELECTRODE) IMPLANT
PORT APPOLLO RF 90DEGREE MULTI (SURGICAL WAND) ×5 IMPLANT
PROBE BIPOLAR ATHRO 135MM 90D (MISCELLANEOUS) ×3 IMPLANT
SLEEVE ARM SUSPENSION SYSTEM (MISCELLANEOUS) ×3 IMPLANT
SLING ARM FOAM STRAP LRG (SOFTGOODS) ×2 IMPLANT
SLING S3 LATERAL DISP (MISCELLANEOUS) IMPLANT
SLING ULTRA II L (ORTHOPEDIC SUPPLIES) ×3 IMPLANT
SPONGE LAP 4X18 RFD (DISPOSABLE) IMPLANT
SUT 2 FIBERLOOP 20 STRT BLUE (SUTURE)
SUT ETHILON 3 0 PS 1 (SUTURE) ×3 IMPLANT
SUT FIBERWIRE #2 38 T-5 BLUE (SUTURE)
SUT LASSO 45 DEGREE LEFT (SUTURE) IMPLANT
SUT LASSO 45D RIGHT (SUTURE) IMPLANT
SUT PDS AB 0 CT1 36 (SUTURE) IMPLANT
SUT TIGER TAPE 7 IN WHITE (SUTURE) IMPLANT
SUT VIC AB 0 CT1 36 (SUTURE) IMPLANT
SUT VIC AB 2-0 CT1 27 (SUTURE)
SUT VIC AB 2-0 CT1 TAPERPNT 27 (SUTURE) IMPLANT
SUTURE 2 FIBERLOOP 20 STRT BLU (SUTURE) IMPLANT
SUTURE FIBERWR #2 38 T-5 BLUE (SUTURE) IMPLANT
SUTURE TAPE 1.3 40 TPR END (SUTURE) ×2 IMPLANT
SUTURE TAPE TIGERLINK 1.3MM BL (SUTURE) IMPLANT
SUTURETAPE 1.3 40 TPR END (SUTURE)
SUTURETAPE TIGERLINK 1.3MM BL (SUTURE) ×3
SYR CONTROL 10ML LL (SYRINGE) ×3 IMPLANT
SYR TB 1ML 27GX1/2 SAFE (SYRINGE) ×1 IMPLANT
SYR TB 1ML 27GX1/2 SAFETY (SYRINGE)
TAPE CLOTH SURG 6X10 WHT LF (GAUZE/BANDAGES/DRESSINGS) ×2 IMPLANT
TAPE FIBER 2MM 7IN #2 BLUE (SUTURE) IMPLANT
TOWEL OR 17X26 10 PK STRL BLUE (TOWEL DISPOSABLE) ×6 IMPLANT
TUBE CONNECTING 12'X1/4 (SUCTIONS) ×2
TUBE CONNECTING 12X1/4 (SUCTIONS) ×3 IMPLANT
TUBING ARTHROSCOPY IRRIG 16FT (MISCELLANEOUS) ×3 IMPLANT
WATER STERILE IRR 500ML POUR (IV SOLUTION) ×3 IMPLANT

## 2018-12-15 NOTE — Op Note (Signed)
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 postop diagnosis #1 left shoulder superior labral tearing anterior labral tearing #2 subluxed biceps tendon 3 high-grade onset #3 is partial rotator cuff tear for which impingement syndrome change in 1 left shoulder arthroscopic biceps tenodesis #2 labral debridement #3 arthroscopic subacromial decompression 9 Surgeon Hart Robinsons, MD Assistant Christean Grief PA-C Anesthesia interscalene block general Estimated blood loss minimal Drains none Complications none Disposition PACU stable  Operative details patient was counseled in the holding area correct side identified marked signed appropriate IV started sedation given and block was administered correct side was marked IV antibiotics were given taken the operating room placed supine position general anesthesia gently placed to the right lateral decubitus position properly padded bump left shoulder was then prepped with DuraPrep and draped in a sterile fashion timeout done confirm the left side utilizing Arthrex sterile shoulder holder we utilized 15 pounds of longitudinal traction making sure we did not over distract his arm.  Posterior portal was created arthroscope was placed into the glenohumeral joint medial identified was significant biceps partial tearing and fraying slight medial subluxation extensive superior and anterior labral tearing the subscapularis showed a partial tear but the majority of the low-grade subscapularis was intact.  Anterior portal was made from outside in technique.  Lastly Arthrex loop intact ice fiber link was placed around the biceps into the biceps to serve what points to the intertubercular groove for inner intertubercular groove biceps tenodesis it was gently then released off the supraglenoid tubercle remained in excellent tension and position the labrum was intubated with a shaver and cautery assisted back to smooth  edge.  No humeral ligament instability were normal as were articular cartilage  Superior arcuate subacromial space was then encountered the space very thickened subacromial bursa lateral portal was established neurovascular structures were protected including axillary nerve subacromial subdeltoid bursectomy was performed.  The cautery system was then utilized to take down the periosteum CA ligament had a very compromised subacromial space with marked fraying of the cuff underneath the CA arch but no frank tearing after this the bursa placed posteriorly anterior inferior lateral acromioplasty was performed utilizing cutting block technique to a flat acromial morphology AC joint was left alone as it was preoperatively asymptomatic the rotator cuff and the bursal surface of the foramen no significant tearing debris was removed hemostasis was obtained no other abnormalities noted he was irrigated arthroscopic it was removed.  Sutures were utilized to close the 3 small puncture wounds sterile dressings applied taken at tractional pulses placed her sling turned supine awakened taken from the operating room to the PACU in stable condition.  He will be stabilized in PACU and discharged home instructions were given to the patient's family no complications or problems.  He will be seen back in office next week get x-rays start physical therapy you have no biceps likely 5 weeks in a sling for 8 weeks  Patient positioning prepping draping technical and surgical assistance throughout the entire case wound closure suture management and assistance Mr. Arletta Bale, PA-C was needed thank you

## 2018-12-15 NOTE — H&P (Signed)
Jim Carson is an 68 y.o. male.   Chief Complaint:left shoulder pain HPI: very pleasant 68 yo male with left sh pain and decrease strength despite conservative treatment.  Past Medical History:  Diagnosis Date  . Abnormal gait    due to parkinson's,  uses cane  . Allergic rhinitis   . Benign essential hypertension   . Chronic constipation   . Deviated septum   . GERD (gastroesophageal reflux disease)   . History of multiple concussions    per pt as teen playing football, no residual  . Hypothyroidism    followed by pcp  . Left shoulder pain   . OA (osteoarthritis)   . OSA on CPAP   . Parkinson disease [facility])    neurologist-- dr b. scott  @[email or web address]  . Vitamin B12 deficiency   . Vitamin D deficiency   . Wears glasses     Past Surgical History:  Procedure Laterality Date  . APPENDECTOMY  07/1964  . LAPAROSCOPIC CHOLECYSTECTOMY  01-21-2017   @[email or web address] , [place]  . POSTERIOR FUSION CERVICAL SPINE  08-15-2011   @[email or web address] , [place]   w/ laminectomy and decompression-- C3 -- T1  . POSTERIOR LAMINECTOMY / DECOMPRESSION LUMBAR SPINE  07-30-2013   @[facility] , [place]   bilateral T11 - 12,  L2 --5  . 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 TOTAL HIP ARTHROPLASTY Left 09/18/2017   @[facility] , [place]    History reviewed. No pertinent family history. Social History:  reports that he has never smoked. He has never used smokeless tobacco. He reports current alcohol use. He reports that he does not use drugs.  Allergies: No Known Allergies  Medications Prior to Admission  Medication Sig Dispense Refill  . aspirin EC 81 MG tablet Take 81 mg by mouth at bedtime.    . carbidopa-levodopa (SINEMET IR) 25-100 MG tablet Take 2 tablets by mouth 3 (three) times daily.     . Carbidopa-Levodopa ER (SINEMET CR) 25-100 MG tablet controlled release Take 1 tablet by mouth at bedtime.     . Cholecalciferol (VITAMIN D3) 125 MCG (5000 UT) CAPS Take 1 capsule by mouth  daily.    Marland Kitchen docusate sodium (COLACE) 100 MG capsule Take 100 mg by mouth daily.    Marland Kitchen ipratropium (ATROVENT) 0.06 % nasal spray Place 2 sprays into both nostrils daily as needed for rhinitis.    Marland Kitchen levothyroxine (SYNTHROID, LEVOTHROID) 75 MCG tablet Take 75 mcg by mouth daily before breakfast.    . losartan-hydrochlorothiazide (HYZAAR) 100-25 MG tablet Take 1 tablet by mouth daily.     Marland Kitchen omeprazole (PRILOSEC) 20 MG capsule Take 20 mg by mouth daily.     . pramipexole (MIRAPEX) 1 MG tablet Take 1 mg by mouth 2 (two) times daily.    . selegiline (ELDEPRYL) 5 MG tablet Take 5 mg by mouth 2 (two) times daily with a meal.    . vitamin B-12 (CYANOCOBALAMIN) 1000 MCG tablet Take 1,000 mcg by mouth daily.    . diclofenac sodium (VOLTAREN) 1 % GEL Apply topically 4 (four) times daily.    . Glucosamine-Chondroitin-Ca-D3 (TRIPLE FLEX 50+ PO) Take by mouth 2 (two) times daily.      Results for orders placed or performed during the hospital encounter of 12/15/18 (from the past 48 hour(s))  Basic metabolic panel     Status: None   Collection Time: 12/15/18 11:52 AM  Result Value Ref Range   Sodium 140 135 -  145 mmol/L   Potassium 3.7 3.5 - 5.1 mmol/L   Chloride 103 98 - 111 mmol/L   CO2 25 22 - 32 mmol/L   Glucose, Bld 90 70 - 99 mg/dL   BUN 19 8 - 23 mg/dL   Creatinine, Ser 0.83 0.61 - 1.24 mg/dL   Calcium 9.2 8.9 - 10.3 mg/dL   GFR calc non Af Amer >60 >60 mL/min   GFR calc Af Amer >60 >60 mL/min   Anion gap 12 5 - 15    Comment: Performed at [facility], [place] [address] [place], [place] [address]  CBC     Status: Abnormal   Collection Time: 12/15/18 11:52 AM  Result Value Ref Range   WBC 5.8 4.0 - 10.5 K/uL   RBC 4.48 4.22 - 5.81 MIL/uL   Hemoglobin 13.2 13.0 - 17.0 g/dL   HCT 40.1 39.0 - 52.0 %   MCV 89.5 80.0 - 100.0 fL   MCH 29.5 26.0 - 34.0 pg   MCHC 32.9 30.0 - 36.0 g/dL   RDW 12.8 11.5 - 15.5 %   Platelets 142 (L) 150 - 400 K/uL   nRBC 0.0 0.0 - 0.2 %     Comment: Performed at [facility], [place] [address], [place],  [address]   No results found.  Review of Systems  Constitutional: Negative.   HENT: Negative.   Eyes: Negative.   Respiratory: Negative.   Cardiovascular: Negative.   Gastrointestinal: Negative.   Genitourinary: Negative.   Musculoskeletal: Negative.   Skin: Negative.   Neurological: Negative.   Endo/Heme/Allergies: Negative.   Psychiatric/Behavioral: Negative.     Blood pressure (!) 142/65, pulse 66, temperature 97.7 F (36.5 C), temperature source Oral, resp. rate 16, height 5\' 8"  (1.727 m), weight 97.2 kg, SpO2 100 %. Physical Exam  Constitutional: He is oriented to person, place, and time. He appears well-developed and well-nourished.  HENT:  Head: Normocephalic and atraumatic.  Eyes: Pupils are equal, round, and reactive to light. Conjunctivae are normal.  Neck: Normal range of motion. Neck supple. No thyromegaly present.  Cardiovascular: Normal rate, regular rhythm and normal heart sounds.  Respiratory: Effort normal and breath sounds normal.  GI: Soft. Bowel sounds are normal.  Musculoskeletal:     Left shoulder: He exhibits normal range of motion, no tenderness, no pain and normal strength.  Lymphadenopathy:    He has no cervical adenopathy.  Neurological: He is alert and oriented to person, place, and time.  Skin: Skin is warm, dry and intact.  Psychiatric: He has a normal mood and affect.     Assessment/Plan leftt RC tear and subluxed biceps tendon   Left shoulder arthtroscopic SAD RCR and biceps tenodesis  Cynda Familia, MD 12/15/2018, 12:41 PM

## 2018-12-15 NOTE — Anesthesia Postprocedure Evaluation (Signed)
Anesthesia Post Note  Patient: Jim Carson  Procedure(s) Performed: SHOULDER ARTHROSCOPY, biceps tenodesis labored Debridement, submacromial decompression (Left Shoulder)     Patient location during evaluation: PACU Anesthesia Type: General Level of consciousness: awake and sedated Pain management: pain level controlled Vital Signs Assessment: post-procedure vital signs reviewed and stable Respiratory status: spontaneous breathing Cardiovascular status: stable Postop Assessment: no apparent nausea or vomiting Anesthetic complications: no    Last Vitals:  Vitals:   12/15/18 1515 12/15/18 1530  BP: (!) 129/58 131/63  Pulse: 66 79  Resp: 12 14  Temp:  36.5 C  SpO2: 100% 97%    Last Pain:  Vitals:   12/15/18 1530  TempSrc:   PainSc: 0-No pain   Pain Goal: Patients Stated Pain Goal: 3 (12/15/18 1029)                 Huston Foley

## 2018-12-15 NOTE — Addendum Note (Signed)
Addendum  created 12/15/18 1647 by Suan Halter, CRNA   Charge Capture section accepted

## 2018-12-15 NOTE — Addendum Note (Signed)
Addendum  created 12/15/18 1612 by Suan Halter, CRNA   Intraprocedure Flowsheets edited

## 2018-12-15 NOTE — Anesthesia Preprocedure Evaluation (Signed)
Anesthesia Evaluation  Patient identified by MRN, date of birth, ID band Patient awake    Reviewed: Allergy & Precautions, NPO status , Patient's Chart, lab work & pertinent test results  Airway Mallampati: II       Dental no notable dental hx. (+) Teeth Intact   Pulmonary sleep apnea ,    Pulmonary exam normal breath sounds clear to auscultation       Cardiovascular hypertension, Pt. on medications Normal cardiovascular exam Rhythm:Regular Rate:Normal     Neuro/Psych    GI/Hepatic GERD  Medicated,  Endo/Other  Hypothyroidism   Renal/GU      Musculoskeletal   Abdominal (+) + obese,   Peds  Hematology   Anesthesia Other Findings   Reproductive/Obstetrics                             Anesthesia Physical Anesthesia Plan  ASA: II  Anesthesia Plan: General   Post-op Pain Management:  Regional for Post-op pain   Induction: Intravenous  PONV Risk Score and Plan: 2 and Ondansetron and Dexamethasone  Airway Management Planned: Oral ETT  Additional Equipment:   Intra-op Plan:   Post-operative Plan: Extubation in OR  Informed Consent: I have reviewed the patients History and Physical, chart, labs and discussed the procedure including the risks, benefits and alternatives for the proposed anesthesia with the patient or authorized representative who has indicated his/her understanding and acceptance.     Dental advisory given  Plan Discussed with: CRNA  Anesthesia Plan Comments:         Anesthesia Quick Evaluation

## 2018-12-15 NOTE — Anesthesia Procedure Notes (Signed)
Anesthesia Regional Block: Interscalene brachial plexus block   Pre-Anesthetic Checklist: ,, timeout performed, Correct Patient, Correct Site, Correct Laterality, Correct Procedure, Correct Position, site marked, Risks and benefits discussed,  Surgical consent,  Pre-op evaluation,  At surgeon's request and post-op pain management  Laterality: Left and Upper  Prep: chloraprep       Needles:  Injection technique: Single-shot  Needle Type: Echogenic Stimulator Needle     Needle Length: 10cm  Needle Gauge: 21   Needle insertion depth: 2 cm   Additional Needles:   Procedures:,,,, ultrasound used (permanent image in chart),,,,  Narrative:  Start time: 12/15/2018 12:24 PM End time: 12/15/2018 12:34 PM Injection made incrementally with aspirations every 5 mL.  Performed by: Personally  Anesthesiologist: Lyn Hollingshead, MD

## 2018-12-15 NOTE — Transfer of Care (Signed)
Immediate Anesthesia Transfer of Care Note  Patient: Ifeanyi Mickelson  Procedure(s) Performed: Procedure(s) (LRB): SHOULDER ARTHROSCOPY, biceps tenodesis labored Debridement, submacromial decompression (Left)  Patient Location: PACU  Anesthesia Type: General  Level of Consciousness: awake, oriented, sedated and patient cooperative  Airway & Oxygen Therapy: Patient Spontanous Breathing and Patient connected to face mask oxygen  Post-op Assessment: Report given to PACU RN and Post -op Vital signs reviewed and stable  Post vital signs: Reviewed and stable  Complications: No apparent anesthesia complications  Last Vitals:  Vitals Value Taken Time  BP 134/58 12/15/18 1454  Temp 36.4 C 12/15/18 1453  Pulse 79 12/15/18 1500  Resp 14 12/15/18 1500  SpO2 99 % 12/15/18 1500  Vitals shown include unvalidated device data.  Last Pain:  Vitals:   12/15/18 1453  TempSrc:   PainSc: 0-No pain      Patients Stated Pain Goal: 3 (12/15/18 1029)

## 2018-12-15 NOTE — Discharge Instructions (Signed)
Leave sling in place until block is worn off then may move elbow hand and wrist do not move shoulder.  Remove dressing on Thursday shower reapply Band-Aids and Neosporin.  Call office for return appointment in 5 to 7 days if not already arranged.  Postoperative medications have been sent electronically to your pharmacy of choice.  Call for questions or concerns.  If dressing turns pink tonight simply leave it in place and reinforce.  Ice to shoulder 20 to 30 minutes every 3-4 hours as necessary for pain.   Post Anesthesia Home Care Instructions  Activity: Get plenty of rest for the remainder of the day. A responsible individual must stay with you for 24 hours following the procedure.  For the next 24 hours, DO NOT: -Drive a car -Paediatric nurse -Drink alcoholic beverages -Take any medication unless instructed by your physician -Make any legal decisions or sign important papers.  Meals: Start with liquid foods such as gelatin or soup. Progress to regular foods as tolerated. Avoid greasy, spicy, heavy foods. If nausea and/or vomiting occur, drink only clear liquids until the nausea and/or vomiting subsides. Call your physician if vomiting continues.  Special Instructions/Symptoms: Your throat may feel dry or sore from the anesthesia or the breathing tube placed in your throat during surgery. If this causes discomfort, gargle with warm salt water. The discomfort should disappear within 24 hours.  If you had a scopolamine patch placed behind your ear for the management of post- operative nausea and/or vomiting:  1. The medication in the patch is effective for 72 hours, after which it should be removed.  Wrap patch in a tissue and discard in the trash. Wash hands thoroughly with soap and water. 2. You may remove the patch earlier than 72 hours if you experience unpleasant side effects which may include dry mouth, dizziness or visual disturbances. 3. Avoid touching the patch. Wash your hands with  soap and water after contact with the patch.    Information for Discharge Teaching: EXPAREL (bupivacaine liposome injectable suspension)   Your surgeon or anesthesiologist gave you EXPAREL(bupivacaine) to help control your pain after surgery.   EXPAREL is a local anesthetic that provides pain relief by numbing the tissue around the surgical site.  EXPAREL is designed to release pain medication over time and can control pain for up to 72 hours.  Depending on how you respond to EXPAREL, you may require less pain medication during your recovery.  Possible side effects:  Temporary loss of sensation or ability to move in the area where bupivacaine was injected.  Nausea, vomiting, constipation  Rarely, numbness and tingling in your mouth or lips, lightheadedness, or anxiety may occur.  Call your doctor right away if you think you may be experiencing any of these sensations, or if you have other questions regarding possible side effects.  Follow all other discharge instructions given to you by your surgeon or nurse. Eat a healthy diet and drink plenty of water or other fluids.  If you return to the hospital for any reason within 96 hours following the administration of EXPAREL, it is important for health care providers to know that you have received this anesthetic. A teal colored band has been placed on your arm with the date, time and amount of EXPAREL you have received in order to alert and inform your health care providers. Please leave this armband in place for the full 96 hours following administration, and then you may remove the band.  Regional Anesthesia Blocks  1. Numbness or the inability to move the "blocked" extremity may last from 3-48 hours after placement. The length of time depends on the medication injected and your individual response to the medication. If the numbness is not going away after 48 hours, call your surgeon.  2. The extremity that is blocked will need to be  protected until the numbness is gone and the  Strength has returned. Because you cannot feel it, you will need to take extra care to avoid injury. Because it may be weak, you may have difficulty moving it or using it. You may not know what position it is in without looking at it while the block is in effect.  3. For blocks in the legs and feet, returning to weight bearing and walking needs to be done carefully. You will need to wait until the numbness is entirely gone and the strength has returned. You should be able to move your leg and foot normally before you try and bear weight or walk. You will need someone to be with you when you first try to ensure you do not fall and possibly risk injury.  4. Bruising and tenderness at the needle site are common side effects and will resolve in a few days.  5. Persistent numbness or new problems with movement should be communicated to the surgeon or the [facility] [phone] [place] [phone]).

## 2018-12-15 NOTE — Anesthesia Procedure Notes (Signed)
Procedure Name: Intubation Date/Time: 12/15/2018 1:09 PM Performed by: Suan Halter, CRNA Pre-anesthesia Checklist: Patient identified, Emergency Drugs available, Suction available and Patient being monitored Patient Re-evaluated:Patient Re-evaluated prior to induction Oxygen Delivery Method: Circle system utilized Preoxygenation: Pre-oxygenation with 100% oxygen Induction Type: IV induction Ventilation: Mask ventilation without difficulty Laryngoscope Size: Mac and 3 Grade View: Grade I Tube type: Oral Tube size: 7.5 mm Number of attempts: 1 Airway Equipment and Method: Stylet and Oral airway Placement Confirmation: ETT inserted through vocal cords under direct vision,  positive ETCO2 and breath sounds checked- equal and bilateral Secured at: 23 cm Tube secured with: Tape Dental Injury: Teeth and Oropharynx as per pre-operative assessment

## 2018-12-15 NOTE — Progress Notes (Signed)
Assisted Dr. Hatchett with left, ultrasound guided, supraclavicular block. Side rails up, monitors on throughout procedure. See vital signs in flow sheet. Tolerated Procedure well. 

## 2018-12-16 ENCOUNTER — Encounter (HOSPITAL_BASED_OUTPATIENT_CLINIC_OR_DEPARTMENT_OTHER): Payer: Self-pay | Admitting: Specialist

## 2018-12-28 ENCOUNTER — Encounter: Payer: Self-pay | Admitting: Physical Therapy

## 2018-12-28 ENCOUNTER — Ambulatory Visit: Payer: Medicare Other | Attending: Specialist | Admitting: Physical Therapy

## 2018-12-28 ENCOUNTER — Other Ambulatory Visit: Payer: Self-pay

## 2018-12-28 DIAGNOSIS — R293 Abnormal posture: Secondary | ICD-10-CM | POA: Insufficient documentation

## 2018-12-28 DIAGNOSIS — M6281 Muscle weakness (generalized): Secondary | ICD-10-CM | POA: Insufficient documentation

## 2018-12-28 DIAGNOSIS — M25612 Stiffness of left shoulder, not elsewhere classified: Secondary | ICD-10-CM | POA: Diagnosis present

## 2018-12-28 DIAGNOSIS — M25512 Pain in left shoulder: Secondary | ICD-10-CM | POA: Diagnosis present

## 2018-12-28 NOTE — Therapy (Signed)
[facility] [address]  [place], [place], [address] Phone: [phone]   Fax:  [phone]  Physical Therapy Evaluation  Patient Details  Name: Jim Carson MRN: [id number] Date of Birth: 1951/06/27 Referring Provider (PT): Sydnee Cabal, MD   Encounter Date: 12/28/2018  PT End of Session - 12/28/18 1555    Visit Number  1    Number of Visits  17    Date for PT Re-Evaluation  02/22/19    Authorization Type  Medicare and [place] Time  4008    PT Stop Time  1549    PT Time Calculation (min)  50 min    Activity Tolerance  Patient tolerated treatment well;Patient limited by pain    Behavior During Therapy  [facility] for tasks assessed/performed       Past Medical History:  Diagnosis Date  . Abnormal gait    due to parkinson's,  uses cane  . Allergic rhinitis   . Benign essential hypertension   . Chronic constipation   . Deviated septum   . GERD (gastroesophageal reflux disease)   . History of multiple concussions    per pt as teen playing football, no residual  . Hypothyroidism    followed by pcp  . Left shoulder pain   . OA (osteoarthritis)   . OSA on CPAP   . Parkinson disease [facility])    neurologist-- dr b. Nicki Reaper  @[facility]  . Vitamin B12 deficiency   . Vitamin D deficiency   . Wears glasses     Past Surgical History:  Procedure Laterality Date  . APPENDECTOMY  07/1964  . LAPAROSCOPIC CHOLECYSTECTOMY  01-21-2017   @[facility] , [place]  . POSTERIOR FUSION CERVICAL SPINE  08-15-2011   @[facility] , [place]   w/ laminectomy and decompression-- C3 -- T1  . POSTERIOR LAMINECTOMY / DECOMPRESSION LUMBAR SPINE  07-30-2013   @[facility] [place]   bilateral T11 - 12,  L2 --5  . 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 SHOULDER ARTHROSCOPY WITH ROTATOR CUFF REPAIR Left 12/15/2018   Procedure: SHOULDER ARTHROSCOPY, biceps tenodesis labored Debridement,  submacromial decompression;  Surgeon: Sydnee Cabal, MD;  Location: [facility];  Service: Orthopedics;  Laterality: Left;  interscalene block  . 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   Subjective Assessment - 12/28/18 1500    Subjective  Patient reports that he underwent L shoulder surgery on 12/15/18. Wearing sling 90% of the time and sleeping with it on. Denies pain. Reports that his surgeon advised him that his RTC tear was healing on its own, thus it was not repaired. Would like to work on ROM and being able to get back to playing the guitar and be able to play golf.    Pertinent History  Parkinson's Disease, hypothyroidism, hx of multiple concussions, GERD, benign essential HTN, abnormal gait, L THA 2019, removal of T1 paraspinous mass 2017, T11-12 and L2-5 lumbar lami/decompression 2015, cervical fusion C3-T1 2013    Limitations  Lifting;House hold activities    Patient Stated Goals  get back to playing the guitar and playing golf    Currently in Pain?  No/denies    Pain Score  0-No pain    Pain Location  Shoulder    Pain Orientation  Left    Pain Descriptors /  Indicators  Sore    Pain Type  Acute pain;Surgical pain         [facility] PT Assessment - 12/28/18 1515      Assessment   Medical Diagnosis  Encounter for other orthopedic aftercare; s/p L biceps tenodesis and subacromial decompression    Referring Provider (PT)  Sydnee Cabal, MD    Onset Date/Surgical Date  12/15/18    Hand Dominance  Left    Next MD Visit  01/13/19    Prior Therapy  Yes      Precautions   Precautions  --   no L shoulder reaching, WBing, no biceps activation 8wks     Balance Screen   Has the patient fallen in the past 6 months  No      [place] residence    Living Arrangements  Spouse/significant other    Available Help at Discharge  Family    Type of [place]  Access  Level entry    [place]  Two level;Able to live on main level with bedroom/bathroom    Alternate Level Stairs-Number of Steps  12    Alternate Level Stairs-Rails  Right;Left    Home Equipment  Walker - 2 wheels;Kasandra Knudsen - single point      Prior Function   Level of Independence  Independent    Vocation  Retired    Office manager, Administrator, arts   Overall Cognitive Status  Within Functional Limits for tasks assessed   N/T in R 1-2 digits and R foot     Observation/Other Assessments   Observations  well-healing incision over L shoulder    Focus on Therapeutic Outcomes (FOTO)   Shoulder: 45 (55% limited, 29% predicted)      Sensation   Light Touch  Appears Intact      Coordination   Gross Motor Movements are Fluid and Coordinated  Yes      Posture/Postural Control   Posture/Postural Control  Postural limitations    Postural Limitations  Rounded Shoulders;Forward head;Increased thoracic kyphosis;Posterior pelvic tilt      ROM / Strength   AROM / PROM / Strength  AROM;PROM;Strength      AROM   AROM Assessment Site  Shoulder    Right/Left Shoulder  Right    Right Shoulder Flexion  159 Degrees    Right Shoulder ABduction  170 Degrees    Right Shoulder Internal Rotation  --   FIR T10   Right Shoulder External Rotation  --   FER T2     PROM   PROM Assessment Site  Shoulder    Right/Left Shoulder  Left    Left Shoulder Flexion  80 Degrees    Left Shoulder ABduction  95 Degrees   in scaption   Left Shoulder Internal Rotation  55 Degrees   in scapular plane   Left Shoulder External Rotation  50 Degrees   in scapular plane     Strength   Strength Assessment Site  Shoulder    Right/Left Shoulder  Right;Left   mild pain in R superior shoulder   Right Shoulder Flexion  5/5    Right Shoulder ABduction  4+/5    Right Shoulder Internal Rotation  4+/5    Right Shoulder External Rotation  4+/5      Palpation   Palpation comment  tightness in L biceps muscle  belly and lateral deltoid- nonpainful  Ambulation/Gait   Assistive device  Straight cane   in R hand               Objective measurements completed on examination: See above findings.              PT Education - 12/28/18 1554    Education Details  prognosis, POC, HEP; edu on surgical precautions    Person(s) Educated  Patient    Methods  Explanation;Demonstration;Tactile cues;Verbal cues;Handout    Comprehension  Verbalized understanding;Returned demonstration       PT Short Term Goals - 12/28/18 1710      PT SHORT TERM GOAL #1   Title  Patient to be independent with initial HEP.    Time  3    Period  Weeks    Status  New    Target Date  01/18/19        PT Long Term Goals - 12/28/18 1710      PT LONG TERM GOAL #1   Title  Patient to be independent with advanced HEP.    Time  8    Period  Weeks    Status  New    Target Date  02/22/19      PT LONG TERM GOAL #2   Title  Patient to demonstrate L shoulder AROM/PROM [facility] and without pain limiting.    Time  8    Period  Weeks    Status  New    Target Date  02/22/19      PT LONG TERM GOAL #3   Title  Patient to demonstrate L shoulder strength >=4+/5.    Time  8    Period  Weeks    Status  New    Target Date  02/22/19      PT LONG TERM GOAL #4   Title  Patient to demonstrate L shoulder overhead lift with 5lbs and good scapular mechanics without pain limiting.    Time  8    Period  Weeks    Status  New    Target Date  02/22/19      PT LONG TERM GOAL #5   Title  Patient to report tolerance of playing guitar for 10 min without pain limiting.    Time  8    Period  Weeks    Status  New    Target Date  02/22/19             Plan - 12/28/18 1701    Clinical Impression Statement  Patient is a 68y/o M, PMH significant for Parkinson's, presenting to OPPT s/p L biceps tenodesis and subacromial debridement on 12/15/18. Patient wearing sling, ambulating with SPC in R hand. Reports minimal  pain. Would like to return to playing guitar and golf. Patient today with limited and painful L shoulder PROM- within scapular plane, abnormal posture, and limited functional activity tolerance. Thoroughly educated patient on surgical precautions and gentle HEP. Patient reported understanding. Would benefit from skilled PT services 2x/week for 8 weeks to address aforementioned impairments.    Personal Factors and Comorbidities  Age;Comorbidity 3+;Time since onset of injury/illness/exacerbation;Fitness;Past/Current Experience    Comorbidities  Parkinson's Disease, hypothyroidism, hx of multiple concussions, GERD, benign essential HTN, abnormal gait, L THA 2019, removal of T1 paraspinous mass 2017, T11-12 and L2-5 lumbar lami/decompression 2015, cervical fusion C3-T1 2013    Examination-Activity Limitations  Bed Mobility;Sleep;Bathing;Caring for Others;Stairs;Carry;Toileting;Dressing;Hygiene/Grooming;Lift;Reach Overhead;Self Feeding    Examination-Participation Restrictions  Cleaning;Shop;Community Activity;Driving;Yard Work;Interpersonal Relationship;Laundry;Meal Prep  Stability/Clinical Decision Making  Stable/Uncomplicated    Clinical Decision Making  Low    Rehab Potential  Good    PT Frequency  2x / week    PT Duration  8 weeks    PT Treatment/Interventions  ADLs/Self Care Home Management;Therapeutic exercise;Therapeutic activities;Functional mobility training;DME Instruction;Neuromuscular re-education;Patient/family education;Cryotherapy;Electrical Stimulation;Moist Heat;Ultrasound;Manual techniques;Vasopneumatic Device;Taping;Energy conservation;Dry needling;Passive range of motion;Scar mobilization    PT Next Visit Plan  reassess HEP    Consulted and Agree with Plan of Care  Patient       Patient will benefit from skilled therapeutic intervention in order to improve the following deficits and impairments:  Hypomobility, Increased edema, Decreased scar mobility, Decreased activity tolerance,  Decreased strength, Impaired UE functional use, Pain, Decreased range of motion, Improper body mechanics, Postural dysfunction, Impaired flexibility  Visit Diagnosis: 1. Acute pain of left shoulder   2. Stiffness of left shoulder, not elsewhere classified   3. Muscle weakness (generalized)   4. Abnormal posture        Problem List There are no active problems to display for this patient.   Janene Harvey, PT, DPT 12/28/18 5:20 PM   [facility] [address]  [place], [place], [address] Phone: [phone]   Fax:  [phone]  Name: Chigozie Basaldua MRN: [id number] Date of Birth: 1951/04/26

## 2018-12-29 ENCOUNTER — Telehealth (HOSPITAL_COMMUNITY): Payer: Self-pay | Admitting: Speech Pathology

## 2018-12-29 ENCOUNTER — Other Ambulatory Visit (HOSPITAL_COMMUNITY): Payer: Self-pay | Admitting: Specialist

## 2018-12-29 DIAGNOSIS — R1319 Other dysphagia: Secondary | ICD-10-CM

## 2018-12-29 NOTE — Telephone Encounter (Signed)
Called MD office spoke with Jim Carson- She will request updated Referral for MBSS and will fax the order to our office. Jim Carson confirmed this is the correct patient and that he has an apptment on 01/07/2019.

## 2018-12-30 ENCOUNTER — Telehealth (HOSPITAL_COMMUNITY): Payer: Self-pay | Admitting: Speech Pathology

## 2018-12-30 NOTE — Telephone Encounter (Signed)
S/w Elmyra Ricks and faxed [facility] MBSS Dysphagia referral to office to be signed by Dr. Jennelle Human.

## 2018-12-31 ENCOUNTER — Other Ambulatory Visit: Payer: Self-pay

## 2018-12-31 ENCOUNTER — Encounter: Payer: Self-pay | Admitting: Physical Therapy

## 2018-12-31 ENCOUNTER — Ambulatory Visit: Payer: Medicare Other | Attending: Specialist | Admitting: Physical Therapy

## 2018-12-31 DIAGNOSIS — M25512 Pain in left shoulder: Secondary | ICD-10-CM | POA: Diagnosis present

## 2018-12-31 DIAGNOSIS — M25612 Stiffness of left shoulder, not elsewhere classified: Secondary | ICD-10-CM | POA: Insufficient documentation

## 2018-12-31 DIAGNOSIS — M6281 Muscle weakness (generalized): Secondary | ICD-10-CM

## 2018-12-31 DIAGNOSIS — R293 Abnormal posture: Secondary | ICD-10-CM | POA: Diagnosis present

## 2018-12-31 NOTE — Therapy (Signed)
[facility] [address]  [place], [place], [address] Phone: [phone]   Fax:  [phone]  Physical Therapy Treatment  Patient Details  Name: Jim Carson MRN: [id number] Date of Birth: August 19, 1950 Referring Provider (PT): Sydnee Cabal, MD   Encounter Date: 12/31/2018  PT End of Session - 12/31/18 1149    Visit Number  2    Number of Visits  17    Date for PT Re-Evaluation  02/22/19    Authorization Type  Medicare and [place]    PT Start Time  1101    PT Stop Time  1158    PT Time Calculation (min)  57 min    Equipment Utilized During Treatment  --   L shoulder sling   Activity Tolerance  Patient tolerated treatment well;Patient limited by pain    Behavior During Therapy  [facility] for tasks assessed/performed       Past Medical History:  Diagnosis Date  . Abnormal gait    due to parkinson's,  uses cane  . Allergic rhinitis   . Benign essential hypertension   . Chronic constipation   . Deviated septum   . GERD (gastroesophageal reflux disease)   . History of multiple concussions    per pt as teen playing football, no residual  . Hypothyroidism    followed by pcp  . Left shoulder pain   . OA (osteoarthritis)   . OSA on CPAP   . Parkinson disease [facility])    neurologist-- dr b. scott  @[facility]  . Vitamin B12 deficiency   . Vitamin D deficiency   . Wears glasses     Past Surgical History:  Procedure Laterality Date  . APPENDECTOMY  07/1964  . LAPAROSCOPIC CHOLECYSTECTOMY  01-21-2017   @[facility] , [place]  . POSTERIOR FUSION CERVICAL SPINE  08-15-2011   @[facility] , [place]   w/ laminectomy and decompression-- C3 -- T1  . POSTERIOR LAMINECTOMY / DECOMPRESSION LUMBAR SPINE  07-30-2013   @[facility] [place]   bilateral T11 - 12,  L2 --5  . 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 SHOULDER ARTHROSCOPY WITH ROTATOR CUFF REPAIR Left 12/15/2018   Procedure: SHOULDER  ARTHROSCOPY, biceps tenodesis labored Debridement, submacromial decompression;  Surgeon: Sydnee Cabal, MD;  [facility];  Service: Orthopedics;  Laterality: Left;  interscalene block  . 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  Subjective Assessment - 12/31/18 1103    Subjective  Patient reports that he is doing well. No issues with the L shoulder.    Pertinent History  Parkinson's Disease, hypothyroidism, hx of multiple concussions, GERD, benign essential HTN, abnormal gait, L THA 2019, removal of T1 paraspinous mass 2017, T11-12 and L2-5 lumbar lami/decompression 2015, cervical fusion C3-T1 2013    Patient Stated Goals  get back to playing the guitar and playing golf    Currently in Pain?  Yes    Pain Score  2     Pain Location  Shoulder    Pain Orientation  Left    Pain Descriptors / Indicators  Sore    Pain Type  Acute pain;Surgical pain                       [facility] Adult PT Treatment/Exercise - 12/31/18 0001      Exercises  Exercises  Shoulder      Shoulder Exercises: Supine   External Rotation  AAROM;Left;10 reps    External Rotation Limitations  with wand and folded pillow under L arm; stopping at 30 degrees   scapular plane   Internal Rotation  AAROM;Left;10 reps    Internal Rotation Limitations  with wand and folded pillow under L arm; stopping at belly   scapular plane   Flexion  AAROM;Left;10 reps    Flexion Limitations  with cane and 1 pillow under L arm; to tolerance    Other Supine Exercises  L shoulder scaption with wand and 1 pillow under L arm x10 to tolerance   heavy manual guidance for proper movement pattern     Shoulder Exercises: Standing   Other Standing Exercises  L shoulder pendulum CW, CCW, M/L, anterior/posterior 30" each    cues to avoid shoulder guarding     Shoulder Exercises: Pulleys   Flexion  3 minutes    Flexion Limitations  to tolerance;  advised to avoid pushing into pain    Scaption  3 minutes    Scaption Limitations  to tolerance; advised to avoid pushing into pain      Shoulder Exercises: Isometric Strengthening   External Rotation  5X5"    External Rotation Limitations  30% effort with towel roll under elbow    Internal Rotation  5X5"    Internal Rotation Limitations  30% effort with towel roll under elbow      Modalities   Modalities  Vasopneumatic      Vasopneumatic   Number Minutes Vasopneumatic   10 minutes    Vasopnuematic Location   Shoulder   L   Vasopneumatic Pressure  Low    Vasopneumatic Temperature   coldest      Manual Therapy   Manual Therapy  Soft tissue mobilization;Joint mobilization;Passive ROM    Manual therapy comments  supine with 2 pillows under head, 1 folded pillow under L arm    Joint Mobilization  gentle grade 3 L shoulder anterior, posterior, inferior joint mobilizations    Passive ROM  L shoulder flexion, scaption, ER and IR in scapular plane to tolerance   very mild pain at end ranges              PT Short Term Goals - 12/31/18 1153      PT SHORT TERM GOAL #1   Title  Patient to be independent with initial HEP.    Time  3    Period  Weeks    Status  On-going    Target Date  01/18/19        PT Long Term Goals - 12/31/18 1153      PT LONG TERM GOAL #1   Title  Patient to be independent with advanced HEP.    Time  8    Period  Weeks    Status  On-going      PT LONG TERM GOAL #2   Title  Patient to demonstrate L shoulder AROM/PROM WFL and without pain limiting.    Time  8    Period  Weeks    Status  On-going      PT LONG TERM GOAL #3   Title  Patient to demonstrate L shoulder strength >=4+/5.    Time  8    Period  Weeks    Status  On-going      PT LONG TERM GOAL #4   Title  Patient to demonstrate L shoulder  overhead lift with 5lbs and good scapular mechanics without pain limiting.    Time  8    Period  Weeks    Status  On-going      PT LONG TERM  GOAL #5   Title  Patient to report tolerance of playing guitar for 10 min without pain limiting.    Time  8    Period  Weeks    Status  On-going            Plan - 12/31/18 1149    Clinical Impression Statement  Patient arrived to session with no new complaints. Began session with flexion and scaption pulley to patient's tolerance. Able to tolerate increased elevation of L arm with increased reps and low pain levels. Still demonstrating muscle guarding with pendulums, with mild incoordination with certain movements. Patient tolerated L shoulder PROM in scapular plane and joint mobilizations with very mild pain at end range. Able to perform AAROM flexion, scaption, and IR/ER with good improvement in ROM with increased reps. Patient reporting L shoulder feeling "looser" at end of session. Ended with [place] to L shoulder for post-exercises soreness. No complaints at end of session.    Comorbidities  Parkinson's Disease, hypothyroidism, hx of multiple concussions, GERD, benign essential HTN, abnormal gait, L THA 2019, removal of T1 paraspinous mass 2017, T11-12 and L2-5 lumbar lami/decompression 2015, cervical fusion C3-T1 2013    PT Treatment/Interventions  ADLs/Self Care Home Management;Therapeutic exercise;Therapeutic activities;Functional mobility training;DME Instruction;Neuromuscular re-education;Patient/family education;Cryotherapy;Electrical Stimulation;Moist Heat;Ultrasound;Manual techniques;Vasopneumatic Device;Taping;Energy conservation;Dry needling;Passive range of motion;Scar mobilization    PT Next Visit Plan  progess AAROM and PROM    Consulted and Agree with Plan of Care  Patient       Patient will benefit from skilled therapeutic intervention in order to improve the following deficits and impairments:  Hypomobility, Increased edema, Decreased scar mobility, Decreased activity tolerance, Decreased strength, Impaired UE functional use, Pain, Decreased range of motion, Improper body  mechanics, Postural dysfunction, Impaired flexibility  Visit Diagnosis: 1. Acute pain of left shoulder   2. Stiffness of left shoulder, not elsewhere classified   3. Muscle weakness (generalized)   4. Abnormal posture        Problem List There are no active problems to display for this patient.  Janene Harvey, PT, DPT 12/31/18 12:03 PM     [facility] [address]  [place], [place], [address] Phone: [phone]   Fax:  [phone]  Name: Khiry Pasquariello MRN: [id number] Date of Birth: [birth year]

## 2019-01-04 ENCOUNTER — Telehealth (HOSPITAL_COMMUNITY): Payer: Self-pay | Admitting: Family Medicine

## 2019-01-04 NOTE — Telephone Encounter (Signed)
01/04/19  Pt called in to confirm the appt MBS time and date

## 2019-01-06 ENCOUNTER — Ambulatory Visit: Payer: Medicare Other

## 2019-01-06 ENCOUNTER — Other Ambulatory Visit: Payer: Self-pay

## 2019-01-06 DIAGNOSIS — M25612 Stiffness of left shoulder, not elsewhere classified: Secondary | ICD-10-CM

## 2019-01-06 DIAGNOSIS — M6281 Muscle weakness (generalized): Secondary | ICD-10-CM

## 2019-01-06 DIAGNOSIS — M25512 Pain in left shoulder: Secondary | ICD-10-CM | POA: Diagnosis not present

## 2019-01-06 DIAGNOSIS — R293 Abnormal posture: Secondary | ICD-10-CM

## 2019-01-06 NOTE — Therapy (Addendum)
[place] [address]  [place], [place], [address] Phone: [phone]   Fax:  [phone]  Physical Therapy Treatment  Patient Details  Name: Jim Carson MRN: [id number] Date of Birth: Oct 18, 1950 Referring Provider (PT): Jim Cabal, MD   Encounter Date: 01/06/2019  PT End of Session - 01/06/19 1046    Visit Number  3    Number of Visits  17    Date for PT Re-Evaluation  02/22/19    Authorization Type  Medicare and [place] Time  1023    PT Stop Time  1110    PT Time Calculation (min)  47 min    Equipment Utilized During Treatment  --   L shoulder sling   Activity Tolerance  Patient tolerated treatment well    Behavior During Therapy  [place] for tasks assessed/performed       Past Medical History:  Diagnosis Date  . Abnormal gait    due to parkinson's,  uses cane  . Allergic rhinitis   . Benign essential hypertension   . Chronic constipation   . Deviated septum   . GERD (gastroesophageal reflux disease)   . History of multiple concussions    per pt as teen playing football, no residual  . Hypothyroidism    followed by pcp  . Left shoulder pain   . OA (osteoarthritis)   . OSA on CPAP   . Parkinson disease [facility])    neurologist-- [place]  @[facility]  Neurology  . Vitamin B12 deficiency   . Vitamin D deficiency   . Wears glasses     Past Surgical History:  Procedure Laterality Date  . APPENDECTOMY  07/1964  . LAPAROSCOPIC CHOLECYSTECTOMY  01-21-2017   @[place] , [place]  . POSTERIOR FUSION CERVICAL SPINE  08-15-2011   @[place] , [place]   w/ laminectomy and decompression-- C3 -- T1  . POSTERIOR LAMINECTOMY / DECOMPRESSION LUMBAR SPINE  07-30-2013   @[place] , [place]   bilateral T11 - 12,  L2 --5  . 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 SHOULDER ARTHROSCOPY WITH ROTATOR CUFF REPAIR Left 12/15/2018   Procedure: SHOULDER ARTHROSCOPY, biceps  tenodesis labored Debridement, submacromial decompression;  Surgeon: [place] Cabal, MD;  Location: [facility];  Service: Orthopedics;  Laterality: Left;  interscalene block  . TOTAL HIP ARTHROPLASTY Left 09/18/2017   @[place] , [place]    There were no vitals filed for this visit.  Subjective Assessment - 01/06/19 1045    Subjective  Pt. doing well today.  Has been pain free.    Pertinent History  Parkinson's Disease, hypothyroidism, hx of multiple concussions, GERD, benign essential HTN, abnormal gait, L THA 2019, removal of T1 paraspinous mass 2017, T11-12 and L2-5 lumbar lami/decompression 2015, cervical fusion C3-T1 2013    Patient Stated Goals  get back to playing the guitar and playing golf    Currently in Pain?  No/denies    Pain Score  0-No pain    Multiple Pain Sites  No                       [facility] Adult PT Treatment/Exercise - 01/06/19 0001      Shoulder Exercises: Supine   External Rotation  AAROM;Left;15 reps    External Rotation Limitations  wand; bolster under elbow maintaining scapular plane    cues required for proper movement pattern  Internal Rotation  AAROM;Left;15 reps    Internal Rotation Limitations  wand; bolster under elbow to maintain scapular plane    Flexion  AAROM;Left;15 reps    Flexion Limitations  wand      Shoulder Exercises: Seated   Retraction  Both;15 reps    Retraction Limitations  5" hold    tactile cueing required for proper motion      Shoulder Exercises: Pulleys   Flexion  3 minutes    Scaption  3 minutes      Shoulder Exercises: Isometric Strengthening   Flexion  5X5"    Flexion Limitations  30% effort; towel under elbow     Extension  5X5"    Extension Limitations  30% effort; towel under elbow     External Rotation  5X5"    External Rotation Limitations  30% effort with towel roll under elbow    Internal Rotation  5X5"    Internal Rotation Limitations  30% effort with towel roll under elbow     ABduction  5X5"    ABduction Limitations  30% effort with towel under elbow             PT Education - 01/06/19 1359    Education Details  HEP update; shoulder isometrics ext. flexion, abduction, ER, IR,  scapular retraction    Person(s) Educated  Patient    Methods  Explanation;Verbal cues    Comprehension  Verbalized understanding       PT Short Term Goals - 12/31/18 1153      PT SHORT TERM GOAL #1   Title  Patient to be independent with initial HEP.    Time  3    Period  Weeks    Status  On-going    Target Date  01/18/19        PT Long Term Goals - 12/31/18 1153      PT LONG TERM GOAL #1   Title  Patient to be independent with advanced HEP.    Time  8    Period  Weeks    Status  On-going      PT LONG TERM GOAL #2   Title  Patient to demonstrate L shoulder AROM/PROM WFL and without pain limiting.    Time  8    Period  Weeks    Status  On-going      PT LONG TERM GOAL #3   Title  Patient to demonstrate L shoulder strength >=4+/5.    Time  8    Period  Weeks    Status  On-going      PT LONG TERM GOAL #4   Title  Patient to demonstrate L shoulder overhead lift with 5lbs and good scapular mechanics without pain limiting.    Time  8    Period  Weeks    Status  On-going      PT LONG TERM GOAL #5   Title  Patient to report tolerance of playing guitar for 10 min without pain limiting.    Time  8    Period  Weeks    Status  On-going            Plan - 01/06/19 1130    Clinical Impression Statement  Jim Carson doing well today reporting L shoulder remains well controlled.  Session focusing on progression of AAROM, isometrics in standing, and exercises with tactile cueing for improved scapular activation.  Pt. requiring some instruction today to avoid holding book and coffee cup in  L hand while at home as to avoid excessive biceps strain to surgical site.  HEP update (see pt. education).  Ended visit pain free thus modalities deferred.  Will monitor tolerance  to updated HEP and continue to progress AAROM, PROM activities per protocol in coming sessions.    Personal Factors and Comorbidities  Age;Comorbidity 3+;Time since onset of injury/illness/exacerbation;Fitness;Past/Current Experience    Comorbidities  Parkinson's Disease, hypothyroidism, hx of multiple concussions, GERD, benign essential HTN, abnormal gait, L THA 2019, removal of T1 paraspinous mass 2017, T11-12 and L2-5 lumbar lami/decompression 2015, cervical fusion C3-T1 2013    Rehab Potential  Good    PT Next Visit Plan  progess AAROM and PROM    Consulted and Agree with Plan of Care  Patient       Patient will benefit from skilled therapeutic intervention in order to improve the following deficits and impairments:  Hypomobility, Increased edema, Decreased scar mobility, Decreased activity tolerance, Decreased strength, Impaired UE functional use, Pain, Decreased range of motion, Improper body mechanics, Postural dysfunction, Impaired flexibility  Visit Diagnosis: 1. Acute pain of left shoulder   2. Stiffness of left shoulder, not elsewhere classified   3. Muscle weakness (generalized)   4. Abnormal posture        Problem List There are no active problems to display for this patient.   Bess Harvest, PTA 01/06/19 1:59 PM    [place] [address]  [place], [place], [address] Phone: [phone]   Fax:  [phone]  Name: Jim Carson MRN: [id number] Date of Birth: 01/24/1951

## 2019-01-07 ENCOUNTER — Ambulatory Visit (HOSPITAL_COMMUNITY)
Admission: RE | Admit: 2019-01-07 | Discharge: 2019-01-07 | Disposition: A | Discharge status: Home / Self Care | Payer: Medicare Other | Source: Ambulatory Visit | Attending: Neurology | Admitting: Neurology

## 2019-01-07 ENCOUNTER — Encounter (HOSPITAL_COMMUNITY): Payer: Self-pay | Admitting: Speech Pathology

## 2019-01-07 ENCOUNTER — Other Ambulatory Visit: Payer: Self-pay

## 2019-01-07 ENCOUNTER — Ambulatory Visit (HOSPITAL_COMMUNITY): Payer: Medicare Other | Attending: Neurology | Admitting: Speech Pathology

## 2019-01-07 DIAGNOSIS — R1319 Other dysphagia: Secondary | ICD-10-CM | POA: Diagnosis present

## 2019-01-07 DIAGNOSIS — R1312 Dysphagia, oropharyngeal phase: Secondary | ICD-10-CM

## 2019-01-07 NOTE — Therapy (Signed)
[place], [place], [address] Phone: [phone]   Fax:  [phone]  Modified Barium Swallow  Patient Details  Name: Jim Carson MRN: [id number] Date of Birth: September 10, 1950 No data recorded  Encounter Date: 01/07/2019  End of Session - 01/07/19 1238    Visit Number  1    Number of Visits  1    Authorization Type  Medicare    SLP Start Time  6962    SLP Stop Time   1200    SLP Time Calculation (min)  49 min    Activity Tolerance  Patient tolerated treatment well       Past Medical History:  Diagnosis Date  . Abnormal gait    due to parkinson's,  uses cane  . Allergic rhinitis   . Benign essential hypertension   . Chronic constipation   . Deviated septum   . GERD (gastroesophageal reflux disease)   . History of multiple concussions    per pt as teen playing football, no residual  . Hypothyroidism    followed by pcp  . Left shoulder pain   . OA (osteoarthritis)   . OSA on CPAP   . Parkinson disease [facility])    neurologist-- dr b. scott  @[facility]  Neurology  . Vitamin B12 deficiency   . Vitamin D deficiency   . Wears glasses     Past Surgical History:  Procedure Laterality Date  . APPENDECTOMY  07/1964  . LAPAROSCOPIC CHOLECYSTECTOMY  01-21-2017   @[facility] , [place]  . POSTERIOR FUSION CERVICAL SPINE  08-15-2011   @[facility] , [place]   w/ laminectomy and decompression-- C3 -- T1  . POSTERIOR LAMINECTOMY / DECOMPRESSION LUMBAR SPINE  07-30-2013   @[facility] [place]   bilateral T11 - 12,  L2 --5  . 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 SHOULDER ARTHROSCOPY WITH ROTATOR CUFF REPAIR Left 12/15/2018   Procedure: SHOULDER ARTHROSCOPY, biceps tenodesis labored Debridement, submacromial decompression;  Surgeon: Sydnee Cabal, MD;  Location: [facility];  Service: Orthopedics;  Laterality: Left;  interscalene block  . TOTAL HIP ARTHROPLASTY Left 09/18/2017    @[facility] , [place]    There were no vitals filed for this visit.  Subjective Assessment - 01/07/19 1229    Subjective  "I have trouble swallowing some solid foods."    Special Tests  MBSS    Currently in Pain?  No/denies          General - 01/07/19 1230      General Information   Date of Onset  05/21/18    HPI  Pt is a 68 yo male referred by Dr. Jennelle Human for MBSS due to Pt with diagnosis of Parkinson's disease (diagnosed January 2014) and reports of difficulty swallowing some solid foods. He reportedly had MBSS at an outside hospital in 2018 which was unremarkable. He had a clinical swallow evaluation in November 2019 through [facility] with recommendation for objective assessment due to globus sensation. He participated in voice/dysarthria therapy in [place] several years ago.    Type of Study  MBS-Modified Barium Swallow Study    Diet Prior to this Study  Regular;Thin liquids    Temperature Spikes Noted  No    Respiratory Status  Room air    History of Recent Intubation  No    Behavior/Cognition  Alert;Cooperative;Pleasant mood    Oral Cavity Assessment  Within Functional Limits  Oral Care Completed by SLP  No    Oral Cavity - Dentition  Adequate natural dentition    Vision  Functional for self feeding    Self-Feeding Abilities  Able to feed self    Patient Positioning  Upright in chair    Baseline Vocal Quality  Normal   mild hypophonia   Volitional Cough  Strong    Volitional Swallow  Able to elicit    Anatomy  Within functional limits   evidence of previous C-spine surgery   Pharyngeal Secretions  Not observed secondary MBS         Oral Preparation/Oral Phase - 01/07/19 1231      Oral Preparation/Oral Phase   Oral Phase  Impaired      Oral - Thin   Oral - Thin Teaspoon  Within functional limits;Oral residue    Oral - Thin Cup  Within functional limits    Oral - Thin Straw  Within functional limits      Oral - Solids   Oral - Puree   Piecemeal swallowing;Oral residue;Decreased bolus cohesion    Oral - Regular  Oral residue;Piecemeal swallowing;Decreased bolus cohesion    Oral - Pill  Within functional limits      Electrical stimulation - Oral Phase   Was Electrical Stimulation Used  No       Pharyngeal Phase - 01/07/19 1233      Pharyngeal Phase   Pharyngeal Phase  Impaired      Pharyngeal - Thin   Pharyngeal- Thin Teaspoon  Swallow initiation at vallecula;Pharyngeal residue - valleculae    Pharyngeal- Thin Cup  Penetration/Aspiration during swallow;Swallow initiation at vallecula    Pharyngeal  Material does not enter airway;Material enters airway, remains ABOVE vocal cords then ejected out    Pharyngeal- Thin Straw  Swallow initiation at pyriform sinus      Pharyngeal - Solids   Pharyngeal- Puree  Reduced epiglottic inversion;Swallow initiation at vallecula    Pharyngeal- Regular  Delayed swallow initiation-vallecula;Reduced epiglottic inversion;Pharyngeal residue - valleculae    Pharyngeal- Pill  Within functional limits      Pharyngeal Phase - Comment   Pharyngeal Comment  Epiglottic deflection was complete on initial swallow and was incomplete on secondary swallow clearing mild BOT residuals      Electrical Stimulation - Pharyngeal Phase   Was Electrical Stimulation Used  No       Cricopharyngeal Phase - 01/07/19 1237      Cervical Esophageal Phase   Cervical Esophageal Phase  Within functional limits               Plan - 01/07/19 1238    Clinical Impression Statement  Pt seen for MBSS following referral from Dr. Jennelle Human. Pt presents with mild oropharyngeal phase dysphagia in setting of Parkinson's disease. Oral phase is characterized by mild reduced bolus cohesiveness and piecemeal deglutition resulting in mild oral residuals which pool to the level of the valleculae which Pt senses and swallows to clear. Pharyngeal phase is marked by swallow initiation at the level of the valleculae  except for straw sips which is triggered at the pyriforms. Hyolaryngeal excursion is WNL and epiglottic deflection is complete with initial swallow. Min reduced tongue base retraction evident if solids fill valleculae before the swallow trigger and results in min vallecular residue. Pt independently repeats/dry swallow to clear, however reduced epiglottic deflection noted without weight of a full bolus. Pt benefited from liquid wash to clear mi/mod solid residuals in vallecular space. Pt  with one episode of flash laryngeal penetration with cup thins when used as a liquid wash, however was immediately expelled. Recommend self regulated regular textures and thin liquids. Pt to consider adding moisture to dry solids, cut meats well, and practice masticating solids and brief oral hold before initiating "hard and fast swallow" with solids, and follow solids with liquid as needed. This study and recommendations were reviewed and written down for Mr. Mehlhoff. He is also reminded to continue with voice exercises previously learned in SLP therapy in [place]. Pt also instructed on chin tuck against resistance exercise to complete at home. Pt may wish to pursue SLP therapy for voice closer to home in [facility] as desired.    Treatment/Interventions  SLP instruction and feedback    Potential to Achieve Goals  Good    Consulted and Agree with Plan of Care  Patient       Patient will benefit from skilled therapeutic intervention in order to improve the following deficits and impairments:   1. Dysphagia, oropharyngeal phase       Recommendations/Treatment - 01/07/19 1237      Swallow Evaluation Recommendations   SLP Diet Recommendations  Age appropriate regular    Liquid Administration via  Cup    Medication Administration  Whole meds with liquid    Supervision  Patient able to self feed    Compensations  Multiple dry swallows after each bite/sip;Follow solids with liquid    Postural Changes  Seated upright at  90 degrees;Remain upright for at least 30 minutes after feeds/meals         Problem List There are no active problems to display for this patient.  Thank you,  Genene Churn, [place]  [facility] 01/07/2019, 12:41 PM  [place] [address] [place], [place], [address] Phone: [phone]   Fax:  [phone]  Name: Jim Carson MRN: [id number] Date of Birth: January 02, 1951

## 2019-01-08 ENCOUNTER — Ambulatory Visit: Payer: Medicare Other

## 2019-01-08 DIAGNOSIS — M25512 Pain in left shoulder: Secondary | ICD-10-CM | POA: Diagnosis not present

## 2019-01-08 DIAGNOSIS — M25612 Stiffness of left shoulder, not elsewhere classified: Secondary | ICD-10-CM

## 2019-01-08 DIAGNOSIS — R293 Abnormal posture: Secondary | ICD-10-CM

## 2019-01-08 DIAGNOSIS — M6281 Muscle weakness (generalized): Secondary | ICD-10-CM

## 2019-01-08 NOTE — Therapy (Signed)
[place] [address]  [place], [place], [address] Phone: [phone]   Fax:  [phone]  Physical Therapy Treatment  Patient Details  Name: Jim Carson MRN: [id number] Date of Birth: 29-Jun-1951 Referring Provider (PT): Sydnee Cabal, MD   Encounter Date: 01/08/2019  PT End of Session - 01/08/19 1024    Visit Number  4    Number of Visits  17    Date for PT Re-Evaluation  02/22/19    Authorization Type  Medicare and [place] Time  1016    PT Stop Time  1113    PT Time Calculation (min)  57 min    Equipment Utilized During Treatment  --   L shoulder sling   Activity Tolerance  Patient tolerated treatment well    Behavior During Therapy  [facility] for tasks assessed/performed       Past Medical History:  Diagnosis Date  . Abnormal gait    due to parkinson's,  uses cane  . Allergic rhinitis   . Benign essential hypertension   . Chronic constipation   . Deviated septum   . GERD (gastroesophageal reflux disease)   . History of multiple concussions    per pt as teen playing football, no residual  . Hypothyroidism    followed by pcp  . Left shoulder pain   . OA (osteoarthritis)   . OSA on CPAP   . Parkinson disease [facility])    neurologist-- dr b. scott  [facility]  Neurology  . Vitamin B12 deficiency   . Vitamin D deficiency   . Wears glasses     Past Surgical History:  Procedure Laterality Date  . APPENDECTOMY  07/1964  . LAPAROSCOPIC CHOLECYSTECTOMY  01-21-2017   '@[facility] , [place]  . POSTERIOR FUSION CERVICAL SPINE  08-15-2011   '@[facility] , [place]   w/ laminectomy and decompression-- C3 -- T1  . POSTERIOR LAMINECTOMY / DECOMPRESSION LUMBAR SPINE  07-30-2013   [facility] [place]   bilateral T11 - 12,  L2 --5  . 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 SHOULDER ARTHROSCOPY WITH ROTATOR CUFF REPAIR Left 12/15/2018   Procedure: SHOULDER ARTHROSCOPY, biceps  tenodesis labored Debridement, submacromial decompression;  Surgeon: Sydnee Cabal, MD;  Location: [facility];  Service: Orthopedics;  Laterality: Left;  interscalene block  . TOTAL HIP ARTHROPLASTY Left 09/18/2017   '@[facility] , [place]    There were no vitals filed for this visit.  Subjective Assessment - 01/08/19 1157    Subjective  Pt. doing well today.    Pertinent History  Parkinson's Disease, hypothyroidism, hx of multiple concussions, GERD, benign essential HTN, abnormal gait, L THA 2019, removal of T1 paraspinous mass 2017, T11-12 and L2-5 lumbar lami/decompression 2015, cervical fusion C3-T1 2013    Patient Stated Goals  get back to playing the guitar and playing golf    Currently in Pain?  No/denies    Pain Score  0-No pain    Multiple Pain Sites  No                       [facility] Adult PT Treatment/Exercise - 01/08/19 0001      Shoulder Exercises: Supine   External Rotation  AAROM;Left;15 reps   tolerated well - elbow on bolster - minor cueing    External Rotation Limitations  wand; 90/90 abduction positioning     Flexion  AAROM;Left;15 reps    Flexion Limitations  wand - cueing for gentle stretch     Other Supine Exercises  L shoulder rhythmic stabilization with shoulder in sustained 90 dg flexion with perturbations from therapist at elbow x 30 sec       Shoulder Exercises: Seated   Retraction  Both;15 reps    Retraction Limitations  5 sec hold   improved scapular ROM with tactile cueing    Flexion  Left;AAROM;10 reps    Flexion Limitations  fleixon/scaption; orange p-wall roll on table with pt. seated in chair     Other Seated Exercises  L shoulder flexion/scaption table slides AAROM x 10 reps       Shoulder Exercises: Prone   Retraction  Left;10 reps;AROM    Retraction Limitations  leaning over bolster on table    cues required for scapular retractoin    Extension  AROM;Left;10 reps    Extension Limitations  leaning over bolster on table        Shoulder Exercises: Sidelying   External Rotation  Left;10 reps;AROM      Shoulder Exercises: Standing   Other Standing Exercises  L shoulder pendulum CW, CCW, M/L, anterior/posterior 30" each    Minor cueing for LE positioning required      Vasopneumatic   Number Minutes Vasopneumatic   10 minutes    Vasopnuematic Location   Shoulder   L   Vasopneumatic Pressure  Low    Vasopneumatic Temperature   coldest      Manual Therapy   Manual Therapy  Soft tissue mobilization;Joint mobilization;Passive ROM    Manual therapy comments  supine     Joint Mobilization  gentle grade 3 L shoulder anterior, posterior, inferior joint mobilizations    Passive ROM  L shoulder PROM all planes with gentle holds to tolerance                PT Short Term Goals - 01/08/19 1025      PT SHORT TERM GOAL #1   Title  Patient to be independent with initial HEP.    Time  3    Period  Weeks    Status  Achieved    Target Date  01/18/19        PT Long Term Goals - 12/31/18 1153      PT LONG TERM GOAL #1   Title  Patient to be independent with advanced HEP.    Time  8    Period  Weeks    Status  On-going      PT LONG TERM GOAL #2   Title  Patient to demonstrate L shoulder AROM/PROM WFL and without pain limiting.    Time  8    Period  Weeks    Status  On-going      PT LONG TERM GOAL #3   Title  Patient to demonstrate L shoulder strength >=4+/5.    Time  8    Period  Weeks    Status  On-going      PT LONG TERM GOAL #4   Title  Patient to demonstrate L shoulder overhead lift with 5lbs and good scapular mechanics without pain limiting.    Time  8    Period  Weeks    Status  On-going      PT LONG TERM GOAL #5   Title  Patient to report tolerance of playing guitar for 10 min without pain limiting.    Time  8  Period  Weeks    Status  On-going            Plan - 01/08/19 1025    Clinical Impression Statement  Pt. reporting he feels independent with initial HEP thus STG  #1 met.  Tolerated all ROM and gentle scapular strengthening activities in session well today per protocol.  Progressed to bent over row, row/extension, supine rhythmic stabilization without issue.  Ended session with ice/compression to L shoulder to reduce post-exercise swelling and pain.    Personal Factors and Comorbidities  Age;Comorbidity 3+;Time since onset of injury/illness/exacerbation;Fitness;Past/Current Experience    Comorbidities  Parkinson's Disease, hypothyroidism, hx of multiple concussions, GERD, benign essential HTN, abnormal gait, L THA 2019, removal of T1 paraspinous mass 2017, T11-12 and L2-5 lumbar lami/decompression 2015, cervical fusion C3-T1 2013    Rehab Potential  Good    PT Treatment/Interventions  ADLs/Self Care Home Management;Therapeutic exercise;Therapeutic activities;Functional mobility training;DME Instruction;Neuromuscular re-education;Patient/family education;Cryotherapy;Electrical Stimulation;Moist Heat;Ultrasound;Manual techniques;Vasopneumatic Device;Taping;Energy conservation;Dry needling;Passive range of motion;Scar mobilization    PT Next Visit Plan  progess AAROM and PROM    Consulted and Agree with Plan of Care  Patient       Patient will benefit from skilled therapeutic intervention in order to improve the following deficits and impairments:  Hypomobility, Increased edema, Decreased scar mobility, Decreased activity tolerance, Decreased strength, Impaired UE functional use, Pain, Decreased range of motion, Improper body mechanics, Postural dysfunction, Impaired flexibility  Visit Diagnosis: 1. Acute pain of left shoulder   2. Stiffness of left shoulder, not elsewhere classified   3. Muscle weakness (generalized)   4. Abnormal posture        Problem List There are no active problems to display for this patient.   Bess Harvest, PTA 01/08/19 12:08 PM   [place] [address]  [place], [place], [address] Phone: [phone]   Fax:  [phone]  Name: Jim [place] MRN: [id number] Date of Birth: 1950-09-06

## 2019-01-11 ENCOUNTER — Ambulatory Visit: Payer: Medicare Other | Admitting: Physical Therapy

## 2019-01-11 ENCOUNTER — Other Ambulatory Visit: Payer: Self-pay

## 2019-01-11 ENCOUNTER — Encounter: Payer: Self-pay | Admitting: Physical Therapy

## 2019-01-11 DIAGNOSIS — R293 Abnormal posture: Secondary | ICD-10-CM

## 2019-01-11 DIAGNOSIS — M6281 Muscle weakness (generalized): Secondary | ICD-10-CM

## 2019-01-11 DIAGNOSIS — M25612 Stiffness of left shoulder, not elsewhere classified: Secondary | ICD-10-CM

## 2019-01-11 DIAGNOSIS — M25512 Pain in left shoulder: Secondary | ICD-10-CM

## 2019-01-11 NOTE — Therapy (Signed)
[facility] [address]  [place], [place], [address] Phone: [phone]   Fax:  [phone]  Physical Therapy Treatment  Patient Details  Name: Jim Carson MRN: [id number] Date of Birth: April 01, 1951 Referring Provider (PT): Sydnee Cabal, MD   Encounter Date: 01/11/2019  PT End of Session - 01/11/19 1109    Visit Number  5    Number of Visits  17    Date for PT Re-Evaluation  02/22/19    Authorization Type  Medicare and [place] Time  1016    PT Stop Time  1117    PT Time Calculation (min)  61 min    Equipment Utilized During Treatment  --   L shoulder sling   Activity Tolerance  Patient tolerated treatment well    Behavior During Therapy  [facility] for tasks assessed/performed       Past Medical History:  Diagnosis Date  . Abnormal gait    due to parkinson's,  uses cane  . Allergic rhinitis   . Benign essential hypertension   . Chronic constipation   . Deviated septum   . GERD (gastroesophageal reflux disease)   . History of multiple concussions    per pt as teen playing football, no residual  . Hypothyroidism    followed by pcp  . Left shoulder pain   . OA (osteoarthritis)   . OSA on CPAP   . Parkinson disease [facility])    neurologist-- dr b. scott  '@[facility]'$  Neurology  . Vitamin B12 deficiency   . Vitamin D deficiency   . Wears glasses     Past Surgical History:  Procedure Laterality Date  . APPENDECTOMY  07/1964  . LAPAROSCOPIC CHOLECYSTECTOMY  01-21-2017   '@[facility]'$ , [place]  . POSTERIOR FUSION CERVICAL SPINE  08-15-2011   '@[facility]'$ , [place]   w/ laminectomy and decompression-- C3 -- T1  . POSTERIOR LAMINECTOMY / DECOMPRESSION LUMBAR SPINE  07-30-2013   '@[facility] , [place]   bilateral T11 - 12,  L2 --5  . 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 SHOULDER ARTHROSCOPY WITH ROTATOR CUFF REPAIR Left 12/15/2018   Procedure: SHOULDER ARTHROSCOPY, biceps  tenodesis labored Debridement, submacromial decompression;  Surgeon: Sydnee Cabal, MD;  Location: [facility];  Service: Orthopedics;  Laterality: Left;  interscalene block  . TOTAL HIP ARTHROPLASTY Left 09/18/2017   '@[facility]'$ , [place]    There were no vitals filed for this visit.  Subjective Assessment - 01/11/19 1018    Subjective  Patient bringing in pulley today "to get calibrated." Has an MD appointment on the 15th. Reports 40% improvement in L shoulder since initial eval.    Pertinent History  Parkinson's Disease, hypothyroidism, hx of multiple concussions, GERD, benign essential HTN, abnormal gait, L THA 2019, removal of T1 paraspinous mass 2017, T11-12 and L2-5 lumbar lami/decompression 2015, cervical fusion C3-T1 2013    Patient Stated Goals  get back to playing the guitar and playing golf    Currently in Pain?  Yes    Pain Score  2     Pain Location  Shoulder    Pain Orientation  Left    Pain Descriptors / Indicators  --   stiffness   Pain Type  Acute pain;Surgical pain         [facility] PT Assessment - 01/11/19 0001      AROM   AROM Assessment Site  Shoulder    Right/Left Shoulder  Left    Right Shoulder Flexion  145 Degrees   AAROM with wand   Right Shoulder ABduction  83 Degrees   AAROM with wand   Right Shoulder Internal Rotation  51 Degrees   AAROM with wand   Right Shoulder External Rotation  37 Degrees   AAROM with wand     PROM   PROM Assessment Site  Shoulder    Right/Left Shoulder  Left    Left Shoulder Flexion  143 Degrees    Left Shoulder ABduction  110 Degrees    Left Shoulder Internal Rotation  66 Degrees    Left Shoulder External Rotation  55 Degrees                   [facility] Adult PT Treatment/Exercise - 01/11/19 0001      Shoulder Exercises: Supine   External Rotation  AAROM;Left;10 reps    External Rotation Limitations  wand; 45 deg abduction positioning    pillow under elbow   Internal Rotation  AAROM;Left;10 reps     Internal Rotation Limitations  wand; 45 deg abduction positioning    pillow under elbow   Flexion  AAROM;Left;10 reps    Flexion Limitations  with wand to tolerance    ABduction  AAROM;Left;10 reps    ABduction Limitations  scaption with wand to tolerance; pillow under L arm      Shoulder Exercises: Seated   Other Seated Exercises  L shoulder flexion pball rollouts AAROM 3"x10 reps       Shoulder Exercises: Sidelying   External Rotation  Left;10 reps;AROM    External Rotation Limitations  small dowel under elbow   cues for slight scap retraction     Shoulder Exercises: Pulleys   Flexion  3 minutes    Scaption  3 minutes      Vasopneumatic   Number Minutes Vasopneumatic   15 minutes    Vasopnuematic Location   Shoulder   L   Vasopneumatic Pressure  Low    Vasopneumatic Temperature   coldest             PT Education - 01/11/19 1108    Education Details  edu on pulley use at home; set up of pulley for proper length and appropriate stretch    Person(s) Educated  Patient    Methods  Explanation;Demonstration;Tactile cues;Verbal cues    Comprehension  Verbalized understanding;Returned demonstration       PT Short Term Goals - 01/11/19 1024      PT SHORT TERM GOAL #1   Title  Patient to be independent with initial HEP.    Time  3    Period  Weeks    Status  Achieved    Target Date  01/18/19        PT Long Term Goals - 01/11/19 1024      PT LONG TERM GOAL #1   Title  Patient to be independent with advanced HEP.    Time  8    Period  Weeks    Status  Partially Met   met for current     PT LONG TERM GOAL #2   Title  Patient to demonstrate L shoulder AROM/PROM WFL and without pain limiting.    Time  8    Period  Weeks    Status  Partially Met   showing good improvements in L shoulder PROM; able to tolerate AAROM with most limitation in scaption/abduction and  ER     PT LONG TERM GOAL #3   Title  Patient to demonstrate L shoulder strength >=4+/5.    Time  8     Period  Weeks    Status  On-going   NT d/t precautions     PT LONG TERM GOAL #4   Title  Patient to demonstrate L shoulder overhead lift with 5lbs and good scapular mechanics without pain limiting.    Time  8    Period  Weeks    Status  On-going   NT d/t precautions     PT LONG TERM GOAL #5   Title  Patient to report tolerance of playing guitar for 10 min without pain limiting.    Time  8    Period  Weeks    Status  On-going   unable at this time           Plan - 01/11/19 1111    Clinical Impression Statement  Patient arrived with report of 40% improvement in L shoulder since initial eval- mentioned most improvement has been made in ROM. Patient has demonstrated good improvements in PROM today, now also reaching good ROM with AAROM with wand. Most ROM limitation is noted in scaption/abduction and ER. Strength and remaining goals not measured today d/t surgical precautions. Good tolerance with sidelying ER; intermittent cues required for slight scapular retraction for proper positioning. Reported mild discomfort in L shoulder with flexion AAROM rollouts at end of session, thus finished appointment with [employer] to L shoulder for pain relief. No complaints at end of session. Patient showing good progress towards goals.    Comorbidities  Parkinson's Disease, hypothyroidism, hx of multiple concussions, GERD, benign essential HTN, abnormal gait, L THA 2019, removal of T1 paraspinous mass 2017, T11-12 and L2-5 lumbar lami/decompression 2015, cervical fusion C3-T1 2013    PT Treatment/Interventions  ADLs/Self Care Home Management;Therapeutic exercise;Therapeutic activities;Functional mobility training;DME Instruction;Neuromuscular re-education;Patient/family education;Cryotherapy;Electrical Stimulation;Moist Heat;Ultrasound;Manual techniques;Vasopneumatic Device;Taping;Energy conservation;Dry needling;Passive range of motion;Scar mobilization    PT Next Visit Plan  progess AAROM and PROM     Consulted and Agree with Plan of Care  Patient       Patient will benefit from skilled therapeutic intervention in order to improve the following deficits and impairments:  Hypomobility, Increased edema, Decreased scar mobility, Decreased activity tolerance, Decreased strength, Impaired UE functional use, Pain, Decreased range of motion, Improper body mechanics, Postural dysfunction, Impaired flexibility  Visit Diagnosis: 1. Acute pain of left shoulder   2. Stiffness of left shoulder, not elsewhere classified   3. Muscle weakness (generalized)   4. Abnormal posture        Problem List There are no active problems to display for this patient.    Janene Harvey, PT, DPT 01/11/19 11:27 AM   [facility] [address]  [address] [place], [place], [address] Phone: [phone]   Fax:  [phone]  Name: Jim Carson MRN: [id number] Date of Birth: 1950/10/10

## 2019-01-14 ENCOUNTER — Other Ambulatory Visit: Payer: Self-pay

## 2019-01-14 ENCOUNTER — Ambulatory Visit: Payer: Medicare Other

## 2019-01-14 DIAGNOSIS — M25512 Pain in left shoulder: Secondary | ICD-10-CM | POA: Diagnosis not present

## 2019-01-14 DIAGNOSIS — M25612 Stiffness of left shoulder, not elsewhere classified: Secondary | ICD-10-CM

## 2019-01-14 DIAGNOSIS — M6281 Muscle weakness (generalized): Secondary | ICD-10-CM

## 2019-01-14 DIAGNOSIS — R293 Abnormal posture: Secondary | ICD-10-CM

## 2019-01-14 NOTE — Therapy (Signed)
[facility] [address]  [address] [place], [place], [address] Phone: [phone]   Fax:  [phone]  Physical Therapy Treatment  Patient Details  Name: Jim Carson MRN: [id number] Date of Birth: 05/09/1951 Referring Provider (PT): Hiren Collins, MD   Encounter Date: 01/14/2019  PT End of Session - 01/14/19 0945    Visit Number  6    Number of Visits  17    Date for PT Re-Evaluation  02/22/19    Authorization Type  Medicare and AARP Plan F    PT Start Time  0932    PT Stop Time  1030    PT Time Calculation (min)  58 min    Equipment Utilized During Treatment  --   L shoulder sling   Activity Tolerance  Patient tolerated treatment well    Behavior During Therapy  WFL for tasks assessed/performed       Past Medical History:  Diagnosis Date  . Abnormal gait    due to parkinson's,  uses cane  . Allergic rhinitis   . Benign essential hypertension   . Chronic constipation   . Deviated septum   . GERD (gastroesophageal reflux disease)   . History of multiple concussions    per pt as teen playing football, no residual  . Hypothyroidism    followed by pcp  . Left shoulder pain   . OA (osteoarthritis)   . OSA on CPAP   . Parkinson disease (HCC)    neurologist-- dr b. scott  @[facility]  . Vitamin B12 deficiency   . Vitamin D deficiency   . Wears glasses     Past Surgical History:  Procedure Laterality Date  . APPENDECTOMY  07/1964  . LAPAROSCOPIC CHOLECYSTECTOMY  01-21-2017   @[facility], [place]  . POSTERIOR FUSION CERVICAL SPINE  08-15-2011   @[facility], [place]   w/ laminectomy and decompression-- C3 -- T1  . POSTERIOR LAMINECTOMY / DECOMPRESSION LUMBAR SPINE  07-30-2013   @[facility], [place]   bilateral T11 - 12,  L2 --5  . REMOVAL LEFT T1 PARASPINOUS MASS  02-12-2016   @[facility],  [place]   desmoid fibromatosis  . SHOULDER ARTHROSCOPY WITH ROTATOR CUFF REPAIR Left 12/15/2018   Procedure: SHOULDER ARTHROSCOPY, biceps  tenodesis labored Debridement, submacromial decompression;  Surgeon: Collins, Jerid, MD;  Location: [facility];  Service: Orthopedics;  Laterality: Left;  interscalene block  . TOTAL HIP ARTHROPLASTY Left 09/18/2017   @[facility], [place]    There were no vitals filed for this visit.  Subjective Assessment - 01/14/19 0939    Subjective  Pt. noting increased L shoulder "stiffness" today without known trigger.    Pertinent History  Parkinson's Disease, hypothyroidism, hx of multiple concussions, GERD, benign essential HTN, abnormal gait, L THA 2019, removal of T1 paraspinous mass 2017, T11-12 and L2-5 lumbar lami/decompression 2015, cervical fusion C3-T1 2013    Patient Stated Goals  get back to playing the guitar and playing golf    Currently in Pain?  Yes    Pain Score  4     Pain Location  Shoulder    Pain Orientation  Left    Pain Descriptors / Indicators  --   "stiffness"   Pain Type  Acute pain;Surgical pain                       [facility] Adult PT Treatment/Exercise - 01/14/19 0001      Shoulder Exercises: Supine   Protraction    Left;15 reps;AROM    Protraction Limitations  cues to avoid scapular elevation     External Rotation  AAROM;Left;10 reps    External Rotation Limitations  wand: 90/90 (abduction/scaption plane) bolster under elbow;     Flexion  AAROM;Left;10 reps   2 sets; 2nd set just L flexion AROM no assistance    Flexion Limitations  wand to tolerance    Other Supine Exercises  L shoulder rhythmic stabilization with shoulder in sustained 90 dg flexion with perturbations from therapist at elbow x 30 sec     Other Supine Exercises  L shoulder circles CW, CCW x 10 reps each way       Shoulder Exercises: Seated   Retraction  Both;15 reps    Retraction Limitations  leaning on pool noodle in chair;5 sec hold   pool noodle to cue for increased scapular retraction      Shoulder Exercises: Prone   Retraction  Left;15 reps;AROM    Retraction  Limitations  leaning over bolster on table    cueing required for scapular retraction/depression    Extension  Left;10 reps   cueing required for scapular retraction/depression    Extension Weight (lbs)  1    Extension Limitations  leaning over bolster on table       Shoulder Exercises: Sidelying   External Rotation  Left;10 reps;AROM    External Rotation Limitations  cloth bolster under elbow and heavy manuel cueing from therapist for scapular retraction       Shoulder Exercises: Pulleys   Flexion  3 minutes    Scaption  3 minutes      Shoulder Exercises: Stretch   Internal Rotation Stretch  --   5" x 10 reps    Internal Rotation Stretch Limitations  wand       Vasopneumatic   Number Minutes Vasopneumatic   10 minutes    Vasopnuematic Location   Shoulder    Vasopneumatic Pressure  Low    Vasopneumatic Temperature   coldest      Manual Therapy   Manual Therapy  Joint mobilization;Passive ROM    Joint Mobilization  gentle grade 3 L shoulder anterior, posterior, inferior joint mobilizations    Passive ROM  Manual L pec stretch with therapist in varying positions x 30 sec eacch; L shoulder PROM all planes with gentle holds to tolerance              PT Education - 01/14/19 1030    Education Details  HEP update; supine protraction, sidelying ER, bent over row (no weight), extension/row bent over    Person(s) Educated  Patient    Methods  Explanation;Demonstration;Verbal cues;Handout    Comprehension  Verbalized understanding;Returned demonstration;Verbal cues required       PT Short Term Goals - 01/11/19 1024      PT SHORT TERM GOAL #1   Title  Patient to be independent with initial HEP.    Time  3    Period  Weeks    Status  Achieved    Target Date  01/18/19        PT Long Term Goals - 01/11/19 1024      PT LONG TERM GOAL #1   Title  Patient to be independent with advanced HEP.    Time  8    Period  Weeks    Status  Partially Met   met for current      PT LONG TERM GOAL #2   Title  Patient to demonstrate  L shoulder AROM/PROM WFL and without pain limiting.    Time  8    Period  Weeks    Status  Partially Met   showing good improvements in L shoulder PROM; able to tolerate AAROM with most limitation in scaption/abduction and ER     PT LONG TERM GOAL #3   Title  Patient to demonstrate L shoulder strength >=4+/5.    Time  8    Period  Weeks    Status  On-going   NT d/t precautions     PT LONG TERM GOAL #4   Title  Patient to demonstrate L shoulder overhead lift with 5lbs and good scapular mechanics without pain limiting.    Time  8    Period  Weeks    Status  On-going   NT d/t precautions     PT LONG TERM GOAL #5   Title  Patient to report tolerance of playing guitar for 10 min without pain limiting.    Time  8    Period  Weeks    Status  On-going   unable at this time           Plan - 01/14/19 1124    Clinical Impression Statement  Jim Carson reporting increased L shoulder stiffness without known trigger today.  Noted shoulder "loosened up" after pulley warmup to begin session.  Tolerated mild progression of periscapular strengthening per protocol well today.  Heavy tactile/verbal cueing provided from therapist for greater scapular retraction/depression with therex today.  Pt. able to demo visible improvement in scapular mechanics with seated retraction following manual stretching to L pecs and manual guidance.  Ended session with ice/compression to L shoulder to reduce post-exercise swelling.    Comorbidities  Parkinson's Disease, hypothyroidism, hx of multiple concussions, GERD, benign essential HTN, abnormal gait, L THA 2019, removal of T1 paraspinous mass 2017, T11-12 and L2-5 lumbar lami/decompression 2015, cervical fusion C3-T1 2013    Rehab Potential  Good    PT Treatment/Interventions  ADLs/Self Care Home Management;Therapeutic exercise;Therapeutic activities;Functional mobility training;DME Instruction;Neuromuscular  re-education;Patient/family education;Cryotherapy;Electrical Stimulation;Moist Heat;Ultrasound;Manual techniques;Vasopneumatic Device;Taping;Energy conservation;Dry needling;Passive range of motion;Scar mobilization    PT Next Visit Plan  progess AAROM and PROM    Consulted and Agree with Plan of Care  Patient       Patient will benefit from skilled therapeutic intervention in order to improve the following deficits and impairments:  Hypomobility, Increased edema, Decreased scar mobility, Decreased activity tolerance, Decreased strength, Impaired UE functional use, Pain, Decreased range of motion, Improper body mechanics, Postural dysfunction, Impaired flexibility  Visit Diagnosis: 1. Acute pain of left shoulder   2. Stiffness of left shoulder, not elsewhere classified   3. Muscle weakness (generalized)   4. Abnormal posture        Problem List There are no active problems to display for this patient.   Jim Carson, Jim Carson 01/14/19 11:33 AM    [facility] [address]  [address] [place], [place], [address] Phone: [phone]   Fax:  [phone]  Name: Reilly Sanor MRN: [id number] Date of Birth: 04/21/1951   

## 2019-01-18 ENCOUNTER — Encounter: Payer: Self-pay | Admitting: Physical Therapy

## 2019-01-18 ENCOUNTER — Other Ambulatory Visit: Payer: Self-pay

## 2019-01-18 ENCOUNTER — Ambulatory Visit: Payer: Medicare Other | Admitting: Physical Therapy

## 2019-01-18 DIAGNOSIS — R293 Abnormal posture: Secondary | ICD-10-CM

## 2019-01-18 DIAGNOSIS — M25612 Stiffness of left shoulder, not elsewhere classified: Secondary | ICD-10-CM

## 2019-01-18 DIAGNOSIS — M25512 Pain in left shoulder: Secondary | ICD-10-CM

## 2019-01-18 DIAGNOSIS — M6281 Muscle weakness (generalized): Secondary | ICD-10-CM

## 2019-01-18 NOTE — Therapy (Signed)
[facility] [place] [address]  [place], [place], [address] Phone: [phone]   Fax:  [phone]  Physical Therapy Treatment  Patient Details  Name: Jim Carson MRN: [id number] Date of Birth: 1951-01-25 Referring Provider (PT): Sydnee Cabal, MD   Encounter Date: 01/18/2019  PT End of Session - 01/18/19 1105    Visit Number  7    Number of Visits  17    Date for PT Re-Evaluation  02/22/19    Authorization Type  Medicare and [place] Time  1020    PT Stop Time  1117    PT Time Calculation (min)  57 min    Equipment Utilized During Treatment  --   L shoulder sling   Activity Tolerance  Patient tolerated treatment well    Behavior During Therapy  [facility] for tasks assessed/performed       Past Medical History:  Diagnosis Date  . Abnormal gait    due to parkinson's,  uses cane  . Allergic rhinitis   . Benign essential hypertension   . Chronic constipation   . Deviated septum   . GERD (gastroesophageal reflux disease)   . History of multiple concussions    per pt as teen playing football, no residual  . Hypothyroidism    followed by pcp  . Left shoulder pain   . OA (osteoarthritis)   . OSA on CPAP   . Parkinson disease [facility])    neurologist-- dr b. scott  '@[facility]  Neurology  . Vitamin B12 deficiency   . Vitamin D deficiency   . Wears glasses     Past Surgical History:  Procedure Laterality Date  . APPENDECTOMY  07/1964  . LAPAROSCOPIC CHOLECYSTECTOMY  01-21-2017   '@[facility] , [place]  . POSTERIOR FUSION CERVICAL SPINE  08-15-2011   '@[facility]'$ , [place]   w/ laminectomy and decompression-- C3 -- T1  . POSTERIOR LAMINECTOMY / DECOMPRESSION LUMBAR SPINE  07-30-2013   '@[facility] , [place]   bilateral T11 - 12,  L2 --5  . 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 SHOULDER ARTHROSCOPY WITH ROTATOR CUFF REPAIR Left 12/15/2018   Procedure: SHOULDER ARTHROSCOPY, biceps  tenodesis labored Debridement, submacromial decompression;  Surgeon: Sydnee Cabal, MD;  Location: [facility];  Service: Orthopedics;  Laterality: Left;  interscalene block  . TOTAL HIP ARTHROPLASTY Left 09/18/2017   '@[facility]'$ , [place]    There were no vitals filed for this visit.  Subjective Assessment - 01/18/19 1024    Subjective  Reports he is doing well.    Pertinent History  Parkinson's Disease, hypothyroidism, hx of multiple concussions, GERD, benign essential HTN, abnormal gait, L THA 2019, removal of T1 paraspinous mass 2017, T11-12 and L2-5 lumbar lami/decompression 2015, cervical fusion C3-T1 2013    Patient Stated Goals  get back to playing the guitar and playing golf    Currently in Pain?  Yes    Pain Score  2     Pain Location  Shoulder    Pain Orientation  Left    Pain Descriptors / Indicators  Sore    Pain Type  Acute pain;Surgical pain                       [facility] Adult PT Treatment/Exercise - 01/18/19 0001      Shoulder Exercises: Supine   External Rotation  AAROM;Left;10 reps  External Rotation Limitations  wand: 90/90 (abduction/scaption plane) bolster under elbow;     Internal Rotation  AAROM;Left;10 reps    Internal Rotation Limitations  wand; 45 deg abduction positioning     Flexion  AAROM;Left;10 reps    Flexion Limitations  wand to tolerance    ABduction  AAROM;Left;10 reps    ABduction Limitations  scaption with wand to tolerance   cues for scaption rather than abduction     Shoulder Exercises: Prone   Retraction  Left;15 reps;AROM    Retraction Limitations  in prone  with towel roll under forehead   good form   Flexion  AROM;Left;10 reps    Flexion Limitations  leaning over counter top in limited ROM    Extension  Left;10 reps    Extension Weight (lbs)  1    Extension Limitations  leaning over counter top      Shoulder Exercises: Sidelying   External Rotation  Left;10 reps;AROM    External Rotation Limitations  2x10;  good ability to maintain elbow at side and slight scap squeeze   cues to stop at neutral to avoid "pulling" in bicep     Shoulder Exercises: Standing   External Rotation  Strengthening;Left;10 reps;Theraband    Theraband Level (Shoulder External Rotation)  Level 1 (Yellow)    External Rotation Limitations  ER stepouts with towel roll under elbow with CGA for balance   requiring manual correction of shoulder/elbow position   Internal Rotation  Strengthening;Left;10 reps;Theraband    Theraband Level (Shoulder Internal Rotation)  Level 1 (Yellow)    Internal Rotation Limitations  ER stepouts with towel roll under elbow with CGA for balance   requiring manual correction of shoulder/elbow position     Shoulder Exercises: Pulleys   Flexion  3 minutes    Scaption  3 minutes   cues to keep elbows in a bit     Vasopneumatic   Number Minutes Vasopneumatic   15 minutes    Vasopnuematic Location   Shoulder   L   Vasopneumatic Pressure  Low    Vasopneumatic Temperature   coldest      Manual Therapy   Manual Therapy  Soft tissue mobilization;Myofascial release    Manual therapy comments  supine    Soft tissue mobilization  STM to L triceps, biceps, lateral deltoid, UT, pec- most tenderness in pec and proximal biceps    Myofascial Release  manual TPR to L pec and UT               PT Short Term Goals - 01/11/19 1024      PT SHORT TERM GOAL #1   Title  Patient to be independent with initial HEP.    Time  3    Period  Weeks    Status  Achieved    Target Date  01/18/19        PT Long Term Goals - 01/11/19 1024      PT LONG TERM GOAL #1   Title  Patient to be independent with advanced HEP.    Time  8    Period  Weeks    Status  Partially Met   met for current     PT LONG TERM GOAL #2   Title  Patient to demonstrate L shoulder AROM/PROM WFL and without pain limiting.    Time  8    Period  Weeks    Status  Partially Met   showing good improvements in L shoulder PROM;  able  to tolerate AAROM with most limitation in scaption/abduction and ER     PT LONG TERM GOAL #3   Title  Patient to demonstrate L shoulder strength >=4+/5.    Time  8    Period  Weeks    Status  On-going   NT d/t precautions     PT LONG TERM GOAL #4   Title  Patient to demonstrate L shoulder overhead lift with 5lbs and good scapular mechanics without pain limiting.    Time  8    Period  Weeks    Status  On-going   NT d/t precautions     PT LONG TERM GOAL #5   Title  Patient to report tolerance of playing guitar for 10 min without pain limiting.    Time  8    Period  Weeks    Status  On-going   unable at this time           Plan - 01/18/19 1106    Clinical Impression Statement  Patient arrived to session with no new complaints. Tolerated STM and manual TPR to L shoulder musculature with most tenderness and soft tissue restriction in pec and proximal biceps. Patient requiring intermittent cues for proper plane of movement in shoulder AAROM. Demonstrated improvement in postural awareness in sidelying ER. Patient with limited prone positioning tolerance d/t flexed posture, but able to perform prone rows with use of towel roll under forehead and pillow under belly. Introduced L shoulder prone flexion in limited ROM with good tolerance. Patient demonstrated instability on feet with IR/ER step outs, requiring CGA. Advised patient to hold off on performing this exercise at home- patient reported understanding. Ended session with [place] to L shoulder for pain relief. No complaints at end of session.    Comorbidities  Parkinson's Disease, hypothyroidism, hx of multiple concussions, GERD, benign essential HTN, abnormal gait, L THA 2019, removal of T1 paraspinous mass 2017, T11-12 and L2-5 lumbar lami/decompression 2015, cervical fusion C3-T1 2013    Rehab Potential  Good    PT Treatment/Interventions  ADLs/Self Care Home Management;Therapeutic exercise;Therapeutic activities;Functional  mobility training;DME Instruction;Neuromuscular re-education;Patient/family education;Cryotherapy;Electrical Stimulation;Moist Heat;Ultrasound;Manual techniques;Vasopneumatic Device;Taping;Energy conservation;Dry needling;Passive range of motion;Scar mobilization    PT Next Visit Plan  progess AAROM and PROM    Consulted and Agree with Plan of Care  Patient       Patient will benefit from skilled therapeutic intervention in order to improve the following deficits and impairments:  Hypomobility, Increased edema, Decreased scar mobility, Decreased activity tolerance, Decreased strength, Impaired UE functional use, Pain, Decreased range of motion, Improper body mechanics, Postural dysfunction, Impaired flexibility  Visit Diagnosis: 1. Acute pain of left shoulder   2. Stiffness of left shoulder, not elsewhere classified   3. Muscle weakness (generalized)   4. Abnormal posture        Problem List There are no active problems to display for this patient.   Janene Harvey, PT, DPT 01/18/19 11:21 AM    [facility] [place], [place], [address] Phone: [phone]   Fax:  [phone]  Name: Jim Carson MRN: [id number] Date of Birth: February 18, 1951

## 2019-01-19 ENCOUNTER — Ambulatory Visit: Payer: Medicare Other | Admitting: Physical Therapy

## 2019-01-21 ENCOUNTER — Other Ambulatory Visit: Payer: Self-pay

## 2019-01-21 ENCOUNTER — Ambulatory Visit: Payer: Medicare Other

## 2019-01-21 DIAGNOSIS — M6281 Muscle weakness (generalized): Secondary | ICD-10-CM

## 2019-01-21 DIAGNOSIS — R293 Abnormal posture: Secondary | ICD-10-CM

## 2019-01-21 DIAGNOSIS — M25512 Pain in left shoulder: Secondary | ICD-10-CM

## 2019-01-21 DIAGNOSIS — M25612 Stiffness of left shoulder, not elsewhere classified: Secondary | ICD-10-CM

## 2019-01-21 NOTE — Therapy (Addendum)
[facility] [place] [facility] [address]  [address] [place], [place], [address] Phone: [phone]   Fax:  [phone]  Physical Therapy Treatment  Patient Details  Name: Jim Carson MRN: [id number] Date of Birth: 12/21/1950 Referring Provider (PT): Tramane Collins, MD   Encounter Date: 01/21/2019  PT End of Session - 01/21/19 1113    Visit Number  8    Number of Visits  17    Date for PT Re-Evaluation  02/22/19    Authorization Type  Medicare and AARP Plan F    PT Start Time  1016    PT Stop Time  1116    PT Time Calculation (min)  60 min    Equipment Utilized During Treatment  --   L shoulder sling   Activity Tolerance  Patient tolerated treatment well    Behavior During Therapy  WFL for tasks assessed/performed       Past Medical History:  Diagnosis Date  . Abnormal gait    due to parkinson's,  uses cane  . Allergic rhinitis   . Benign essential hypertension   . Chronic constipation   . Deviated septum   . GERD (gastroesophageal reflux disease)   . History of multiple concussions    per pt as teen playing football, no residual  . Hypothyroidism    followed by pcp  . Left shoulder pain   . OA (osteoarthritis)   . OSA on CPAP   . Parkinson disease ([facility])    neurologist-- dr b. scott  @[facility]  . Vitamin B12 deficiency   . Vitamin D deficiency   . Wears glasses     Past Surgical History:  Procedure Laterality Date  . APPENDECTOMY  07/1964  . LAPAROSCOPIC CHOLECYSTECTOMY  01-21-2017   @[facility], [place]  . POSTERIOR FUSION CERVICAL SPINE  08-15-2011   @[facility], [place]   w/ laminectomy and decompression-- C3 -- T1  . POSTERIOR LAMINECTOMY / DECOMPRESSION LUMBAR SPINE  07-30-2013   @[facility], [place]   bilateral T11 - 12,  L2 --5  . REMOVAL LEFT T1 PARASPINOUS MASS  02-12-2016   @[facility],  [place]   desmoid fibromatosis  . SHOULDER ARTHROSCOPY WITH ROTATOR CUFF REPAIR Left 12/15/2018   Procedure: SHOULDER ARTHROSCOPY, biceps  tenodesis labored Debridement, submacromial decompression;  Surgeon: Collins, Karmine, MD;  Location: [facility];  Service: Orthopedics;  Laterality: Left;  interscalene block  . TOTAL HIP ARTHROPLASTY Left 09/18/2017   @[facility], [place]    There were no vitals filed for this visit.  Subjective Assessment - 01/21/19 1031    Subjective  Doing fine.   No issues with HEP.    Pertinent History  Parkinson's Disease, hypothyroidism, hx of multiple concussions, GERD, benign essential HTN, abnormal gait, L THA 2019, removal of T1 paraspinous mass 2017, T11-12 and L2-5 lumbar lami/decompression 2015, cervical fusion C3-T1 2013    Patient Stated Goals  get back to playing the guitar and playing golf    Currently in Pain?  Yes    Pain Location  Shoulder    Pain Orientation  Left    Pain Descriptors / Indicators  Sore    Pain Type  Acute pain;Surgical pain         [facility] PT Assessment - 01/21/19 0001      PROM   PROM Assessment Site  Shoulder    Right/Left Shoulder  Left    Left Shoulder Flexion  150 Degrees    Left Shoulder ABduction  112 Degrees      Left Shoulder Internal Rotation  60 Degrees    Left Shoulder External Rotation  55 Degrees                   [facility] Adult PT Treatment/Exercise - 01/21/19 0001      Shoulder Exercises: Supine   External Rotation  AAROM;Left;15 reps    External Rotation Limitations  wand: 90/90 (abduction/scaption plane) bolster under elbow;     Internal Rotation  AAROM;Left;10 reps    Internal Rotation Limitations  wand; 45 deg abduction positioning     Flexion  AAROM;Left;10 reps   visible improvement    Flexion Limitations  wand to tolerance    ABduction  AAROM;Left;10 reps    ABduction Limitations  scaption with wand to tolerance   tactile cues for guidance      Shoulder Exercises: Seated   Flexion  Left;AAROM;10 reps    Flexion Limitations  red p-ball rollout      Shoulder Exercises: Pulleys   Flexion  3 minutes    Scaption   3 minutes      Shoulder Exercises: ROM/Strengthening   Wall Wash  L wall wash flexion x 10 reps       Vasopneumatic   Number Minutes Vasopneumatic   10 minutes    Vasopnuematic Location   Shoulder    Vasopneumatic Pressure  Low    Vasopneumatic Temperature   coldest      Manual Therapy   Manual Therapy  Soft tissue mobilization    Manual therapy comments  supine    Soft tissue mobilization  L posterior/inferior shoulder     Myofascial Release  TPR to L pec    Passive ROM  L shoulder PROM all directions with prolonged holds; manual L pec stretch              PT Education - 01/21/19 1143    Education Details  HEP update    Person(s) Educated  Patient    Methods  Explanation;Demonstration;Verbal cues    Comprehension  Verbalized understanding;Returned demonstration;Verbal cues required       PT Short Term Goals - 01/11/19 1024      PT SHORT TERM GOAL #1   Title  Patient to be independent with initial HEP.    Time  3    Period  Weeks    Status  Achieved    Target Date  01/18/19        PT Long Term Goals - 01/11/19 1024      PT LONG TERM GOAL #1   Title  Patient to be independent with advanced HEP.    Time  8    Period  Weeks    Status  Partially Met   met for current     PT LONG TERM GOAL #2   Title  Patient to demonstrate L shoulder AROM/PROM WFL and without pain limiting.    Time  8    Period  Weeks    Status  Partially Met   showing good improvements in L shoulder PROM; able to tolerate AAROM with most limitation in scaption/abduction and ER     PT LONG TERM GOAL #3   Title  Patient to demonstrate L shoulder strength >=4+/5.    Time  8    Period  Weeks    Status  On-going   NT d/t precautions     PT LONG TERM GOAL #4   Title  Patient to demonstrate L shoulder overhead lift with 5lbs and   good scapular mechanics without pain limiting.    Time  8    Period  Weeks    Status  On-going   NT d/t precautions     PT LONG TERM GOAL #5   Title   Patient to report tolerance of playing guitar for 10 min without pain limiting.    Time  8    Period  Weeks    Status  On-going   unable at this time           Plan - 01/21/19 1114    Clinical Impression Statement  Pt. able to demo minor improvement in L shoulder PROM flexion, abduction today up to 151 dg, 112 dg respectively.  Progressing toward LTG #2.  MT addressing tightness in L pec and posterior/inferior stretch with STM and prolonged holds with improved PROM following MT.  Progressed to AAROM flexion wall wash today which was tolerated well thus updated HEP with this activity along with supine wand AAROM abduction as pt. remains limited in this motion.  Pt. pain free throughout session today.  Ended visit with ice/compression to L shoulder to reduce post-exercise swelling.  Progressing well per protocol.    Personal Factors and Comorbidities  Age;Comorbidity 3+;Time since onset of injury/illness/exacerbation;Fitness;Past/Current Experience    Comorbidities  Parkinson's Disease, hypothyroidism, hx of multiple concussions, GERD, benign essential HTN, abnormal gait, L THA 2019, removal of T1 paraspinous mass 2017, T11-12 and L2-5 lumbar lami/decompression 2015, cervical fusion C3-T1 2013    Rehab Potential  Good    PT Treatment/Interventions  ADLs/Self Care Home Management;Therapeutic exercise;Therapeutic activities;Functional mobility training;DME Instruction;Neuromuscular re-education;Patient/family education;Cryotherapy;Electrical Stimulation;Moist Heat;Ultrasound;Manual techniques;Vasopneumatic Device;Taping;Energy conservation;Dry needling;Passive range of motion;Scar mobilization    PT Next Visit Plan  progess AAROM and PROM    Consulted and Agree with Plan of Care  Patient       Patient will benefit from skilled therapeutic intervention in order to improve the following deficits and impairments:  Hypomobility, Increased edema, Decreased scar mobility, Decreased activity tolerance,  Decreased strength, Impaired UE functional use, Pain, Decreased range of motion, Improper body mechanics, Postural dysfunction, Impaired flexibility  Visit Diagnosis: 1. Acute pain of left shoulder   2. Stiffness of left shoulder, not elsewhere classified   3. Muscle weakness (generalized)   4. Abnormal posture        Problem List There are no active problems to display for this patient.   Micah Denny, PTA 01/21/19 11:43 AM   [facility] [place] [address]  [address] [place], [place], [address] Phone: [phone]   Fax:  [phone]  Name: Nelton Blumenberg MRN: [id number] Date of Birth: 04/26/1951   

## 2019-01-25 ENCOUNTER — Encounter: Payer: Self-pay | Admitting: Physical Therapy

## 2019-01-25 ENCOUNTER — Ambulatory Visit: Payer: Medicare Other | Admitting: Physical Therapy

## 2019-01-25 ENCOUNTER — Other Ambulatory Visit: Payer: Self-pay

## 2019-01-25 DIAGNOSIS — M25512 Pain in left shoulder: Secondary | ICD-10-CM | POA: Diagnosis not present

## 2019-01-25 DIAGNOSIS — R293 Abnormal posture: Secondary | ICD-10-CM

## 2019-01-25 DIAGNOSIS — M25612 Stiffness of left shoulder, not elsewhere classified: Secondary | ICD-10-CM

## 2019-01-25 DIAGNOSIS — M6281 Muscle weakness (generalized): Secondary | ICD-10-CM

## 2019-01-25 NOTE — Therapy (Deleted)
[facility] [address]  [place], [place], [address] Phone: [phone]   Fax:  [phone]  Physical Therapy Evaluation  Patient Details  Name: Jim Carson MRN: [id number] Date of Birth: 10/24/1950 Referring Provider (PT): Sydnee Cabal, MD   Encounter Date: 01/25/2019  PT End of Session - 01/25/19 1113    Visit Number  9    Number of Visits  17    Date for PT Re-Evaluation  02/22/19    Authorization Type  Medicare and [place] Time  1016    PT Stop Time  1101    PT Time Calculation (min)  45 min    Equipment Utilized During Treatment  --   L shoulder sling   Activity Tolerance  Patient tolerated treatment well    Behavior During Therapy  [facility] for tasks assessed/performed       Past Medical History:  Diagnosis Date  . Abnormal gait    due to parkinson's,  uses cane  . Allergic rhinitis   . Benign essential hypertension   . Chronic constipation   . Deviated septum   . GERD (gastroesophageal reflux disease)   . History of multiple concussions    per pt as teen playing football, no residual  . Hypothyroidism    followed by pcp  . Left shoulder pain   . OA (osteoarthritis)   . OSA on CPAP   . Parkinson disease [facility])    neurologist-- dr b. scott  '@[facility]'$  Neurology  . Vitamin B12 deficiency   . Vitamin D deficiency   . Wears glasses     Past Surgical History:  Procedure Laterality Date  . APPENDECTOMY  07/1964  . LAPAROSCOPIC CHOLECYSTECTOMY  01-21-2017   '@[facility]'$ , [place]  . POSTERIOR FUSION CERVICAL SPINE  08-15-2011   '@[facility]'$ , [place]   w/ laminectomy and decompression-- C3 -- T1  . POSTERIOR LAMINECTOMY / DECOMPRESSION LUMBAR SPINE  07-30-2013   '@[facility]'$ , [place]   bilateral T11 - 12,  L2 --5  . 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 SHOULDER ARTHROSCOPY WITH ROTATOR CUFF REPAIR Left 12/15/2018   Procedure: SHOULDER ARTHROSCOPY, biceps  tenodesis labored Debridement, submacromial decompression;  Surgeon: Sydnee Cabal, MD;  Location: [facility];  Service: Orthopedics;  Laterality: Left;  interscalene block  . TOTAL HIP ARTHROPLASTY Left 09/18/2017   '@[facility]'$ , [place]    There were no vitals filed for this visit.   Subjective Assessment - 01/25/19 1017    Subjective  Reports he is going okay. No issues.    Pertinent History  Parkinson's Disease, hypothyroidism, hx of multiple concussions, GERD, benign essential HTN, abnormal gait, L THA 2019, removal of T1 paraspinous mass 2017, T11-12 and L2-5 lumbar lami/decompression 2015, cervical fusion C3-T1 2013    Patient Stated Goals  get back to playing the guitar and playing golf    Currently in Pain?  Yes    Pain Score  1     Pain Location  Shoulder    Pain Orientation  Left    Pain Descriptors / Indicators  Sore    Pain Type  Acute pain;Surgical pain                    Objective measurements completed on examination: See above findings.      [facility] Adult PT Treatment/Exercise - 01/25/19 0001  Exercises   Exercises  Elbow      Elbow Exercises   Elbow Flexion  AROM;Left;10 reps;Seated    Elbow Flexion Limitations  2x10; with shoulder in neutral and towel roll under elbow      Shoulder Exercises: Seated   External Rotation  AAROM;Left;10 reps    External Rotation Limitations  sitting with wand to tolerance; palm up    Internal Rotation  AAROM;Left;10 reps    Internal Rotation Limitations  sitting with wand to tolerance    Flexion  AAROM;Left;10 reps    Flexion Limitations  sitting with wand; to tolerance    Abduction  AAROM;Left;10 reps    ABduction Limitations  scaption; sitting with wand; to tolerance   manual cues to stay in scaption     Shoulder Exercises: Prone   Retraction  Strengthening;Left;10 reps    Retraction Weight (lbs)  2    Retraction Limitations  2x10; leaning over counter with 2#   reminder for scap squeeze    Extension  Strengthening;Left;10 reps;Weights    Extension Weight (lbs)  2    Extension Limitations  2x10; leaning over counter with 2#   reminder for scap squeeZE     Shoulder Exercises: Sidelying   External Rotation  Left;10 reps;Strengthening    External Rotation Limitations  1st set 0#, 2nd set 1#; cues to stop at neutral    ABduction  AROM;Left;10 reps    ABduction Limitations  to tolerance      Shoulder Exercises: Pulleys   Flexion  3 minutes    Scaption  3 minutes      Manual Therapy   Manual Therapy  Soft tissue mobilization;Myofascial release    Manual therapy comments  sitting    Soft tissue mobilization  L infraspinatus/teres group insertion, biceps muscle belly and proximal tendon, pec insertion- most tightness in proximal biceps and pec    Myofascial Release  TPR to L pec             PT Education - 01/25/19 1112    Education Details  update to HEP- advised to use 2lbs with prone row and prone extension over counter top as well as performing L shoulder AAROM in sitting with wand    Person(s) Educated  Patient    Methods  Explanation;Demonstration;Tactile cues;Verbal cues;Handout    Comprehension  Verbalized understanding;Returned demonstration       PT Short Term Goals - 01/11/19 1024      PT SHORT TERM GOAL #1   Title  Patient to be independent with initial HEP.    Time  3    Period  Weeks    Status  Achieved    Target Date  01/18/19        PT Long Term Goals - 01/11/19 1024      PT LONG TERM GOAL #1   Title  Patient to be independent with advanced HEP.    Time  8    Period  Weeks    Status  Partially Met   met for current     PT LONG TERM GOAL #2   Title  Patient to demonstrate L shoulder AROM/PROM WFL and without pain limiting.    Time  8    Period  Weeks    Status  Partially Met   showing good improvements in L shoulder PROM; able to tolerate AAROM with most limitation in scaption/abduction and ER     PT LONG TERM GOAL #3   Title   Patient  to demonstrate L shoulder strength >=4+/5.    Time  8    Period  Weeks    Status  On-going   NT d/t precautions     PT LONG TERM GOAL #4   Title  Patient to demonstrate L shoulder overhead lift with 5lbs and good scapular mechanics without pain limiting.    Time  8    Period  Weeks    Status  On-going   NT d/t precautions     PT LONG TERM GOAL #5   Title  Patient to report tolerance of playing guitar for 10 min without pain limiting.    Time  8    Period  Weeks    Status  On-going   unable at this time            Plan - 01/25/19 1114    Clinical Impression Statement  Patient demonstrating good tolerance for progressive L shoulder ROM and strengthening ther-ex. Initiated sitting shoulder AAROM in all planes with wand- demonstrated good ROM and with mild L shoulder hiking at end range of flexion AAROM. Added light weight to sidelying ER and initiated sidelying abduction with good control and form. Patient requiring cues to stop at neutral with ER. Requiring reminder for scapular retraction with prone row and prone extension over counter top. Educated patient on update to HEP- patient reported understanding. Tolerated STM and manual TPR to L infraspinatus/teres group, biceps muscle belly and proximal tendon, and pec with most tightness in proximal biceps and pec. Ended session without modalities d/t good tolerance for today's activities.    Comorbidities  Parkinson's Disease, hypothyroidism, hx of multiple concussions, GERD, benign essential HTN, abnormal gait, L THA 2019, removal of T1 paraspinous mass 2017, T11-12 and L2-5 lumbar lami/decompression 2015, cervical fusion C3-T1 2013    PT Treatment/Interventions  ADLs/Self Care Home Management;Therapeutic exercise;Therapeutic activities;Functional mobility training;DME Instruction;Neuromuscular re-education;Patient/family education;Cryotherapy;Electrical Stimulation;Moist Heat;Ultrasound;Manual techniques;Vasopneumatic  Device;Taping;Energy conservation;Dry needling;Passive range of motion;Scar mobilization    PT Next Visit Plan  progress per protocol    Consulted and Agree with Plan of Care  Patient       Patient will benefit from skilled therapeutic intervention in order to improve the following deficits and impairments:  Hypomobility, Increased edema, Decreased scar mobility, Decreased activity tolerance, Decreased strength, Impaired UE functional use, Pain, Decreased range of motion, Improper body mechanics, Postural dysfunction, Impaired flexibility  Visit Diagnosis: 1. Acute pain of left shoulder   2. Stiffness of left shoulder, not elsewhere classified   3. Muscle weakness (generalized)   4. Abnormal posture        Problem List There are no active problems to display for this patient.   Manuela Neptune 01/25/2019, 11:22 AM  [facility] [address]  [place], [place], [address] Phone: [phone]   Fax:  [phone]  Name: Jim Carson MRN: [id number] Date of Birth: [birth year]

## 2019-01-25 NOTE — Therapy (Signed)
[facility] [address]  [place], [place], [address] Phone: [phone]   Fax:  [phone]  Physical Therapy Treatment  Patient Details  Name: Jim Carson MRN: [id number] Date of Birth: [birth year] Referring Provider (PT): Jim Cabal, MD   Encounter Date: 01/25/2019  PT End of Session - 01/25/19 1113    Visit Number  9    Number of Visits  17    Date for PT Re-Evaluation  02/22/19    Authorization Type  Medicare and [place] Time  1016    PT Stop Time  1101    PT Time Calculation (min)  45 min    Equipment Utilized During Treatment  --   L shoulder sling   Activity Tolerance  Patient tolerated treatment well    Behavior During Therapy  Jim Carson for tasks assessed/performed       Past Medical History:  Diagnosis Date  . Abnormal gait    due to parkinson's,  uses cane  . Allergic rhinitis   . Benign essential hypertension   . Chronic constipation   . Deviated septum   . GERD (gastroesophageal reflux disease)   . History of multiple concussions    per pt as teen playing football, no residual  . Hypothyroidism    followed by pcp  . Left shoulder pain   . OA (osteoarthritis)   . OSA on CPAP   . Parkinson disease [facility])    neurologist-- dr b. scott  _0  Neurology  . Vitamin B12 deficiency   . Vitamin D deficiency   . Wears glasses     Past Surgical History:  Procedure Laterality Date  . APPENDECTOMY  07/1964  . LAPAROSCOPIC CHOLECYSTECTOMY  01-21-2017   _1 , [place]  . POSTERIOR FUSION CERVICAL SPINE  08-15-2011   _2 , [place]   w/ laminectomy and decompression-- C3 -- T1  . POSTERIOR LAMINECTOMY / DECOMPRESSION LUMBAR SPINE  07-30-2013   _3 , [place]   bilateral T11 - 12,  L2 --5  . 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 SHOULDER ARTHROSCOPY WITH ROTATOR CUFF REPAIR Left 12/15/2018   Procedure: SHOULDER ARTHROSCOPY, biceps  tenodesis labored Debridement, submacromial decompression;  Surgeon: Jim Cabal, MD;  Location: [facility];  Service: Orthopedics;  Laterality: Left;  interscalene block  . TOTAL HIP ARTHROPLASTY Left 09/18/2017   _5 , [place]    There were no vitals filed for this visit.  Subjective Assessment - 01/25/19 1017    Subjective  Reports he is going okay. No issues.    Pertinent History  Parkinson's Disease, hypothyroidism, hx of multiple concussions, GERD, benign essential HTN, abnormal gait, L THA 2019, removal of T1 paraspinous mass 2017, T11-12 and L2-5 lumbar lami/decompression 2015, cervical fusion C3-T1 2013    Patient Stated Goals  get back to playing the guitar and playing golf    Currently in Pain?  Yes    Pain Score  1     Pain Location  Shoulder    Pain Orientation  Left    Pain Descriptors / Indicators  Sore    Pain Type  Acute pain;Surgical pain                       [facility] Adult PT Treatment/Exercise - 01/25/19 0001      Exercises   Exercises  Elbow  Elbow Exercises   Elbow Flexion  AROM;Left;10 reps;Seated    Elbow Flexion Limitations  2x10; with shoulder in neutral and towel roll under elbow      Shoulder Exercises: Seated   External Rotation  AAROM;Left;10 reps    External Rotation Limitations  sitting with wand to tolerance; palm up    Internal Rotation  AAROM;Left;10 reps    Internal Rotation Limitations  sitting with wand to tolerance    Flexion  AAROM;Left;10 reps    Flexion Limitations  sitting with wand; to tolerance    Abduction  AAROM;Left;10 reps    ABduction Limitations  scaption; sitting with wand; to tolerance   manual cues to stay in scaption     Shoulder Exercises: Prone   Retraction  Strengthening;Left;10 reps    Retraction Weight (lbs)  2    Retraction Limitations  2x10; leaning over counter with 2#   reminder for scap squeeze   Extension  Strengthening;Left;10 reps;Weights    Extension Weight (lbs)  2     Extension Limitations  2x10; leaning over counter with 2#   reminder for scap squeeZE     Shoulder Exercises: Sidelying   External Rotation  Left;10 reps;Strengthening    External Rotation Limitations  1st set 0#, 2nd set 1#; cues to stop at neutral    ABduction  AROM;Left;10 reps    ABduction Limitations  to tolerance      Shoulder Exercises: Pulleys   Flexion  3 minutes    Scaption  3 minutes      Manual Therapy   Manual Therapy  Soft tissue mobilization;Myofascial release    Manual therapy comments  sitting    Soft tissue mobilization  L infraspinatus/teres group insertion, biceps muscle belly and proximal tendon, pec insertion- most tightness in proximal biceps and pec    Myofascial Release  TPR to L pec             PT Education - 01/25/19 1112    Education Details  update to HEP- advised to use 2lbs with prone row and prone extension over counter top as well as performing L shoulder AAROM in sitting with wand    Person(s) Educated  Patient    Methods  Explanation;Demonstration;Tactile cues;Verbal cues;Handout    Comprehension  Verbalized understanding;Returned demonstration       PT Short Term Goals - 01/11/19 1024      PT SHORT TERM GOAL #1   Title  Patient to be independent with initial HEP.    Time  3    Period  Weeks    Status  Achieved    Target Date  01/18/19        PT Long Term Goals - 01/11/19 1024      PT LONG TERM GOAL #1   Title  Patient to be independent with advanced HEP.    Time  8    Period  Weeks    Status  Partially Met   met for current     PT LONG TERM GOAL #2   Title  Patient to demonstrate L shoulder AROM/PROM WFL and without pain limiting.    Time  8    Period  Weeks    Status  Partially Met   showing good improvements in L shoulder PROM; able to tolerate AAROM with most limitation in scaption/abduction and ER     PT LONG TERM GOAL #3   Title  Patient to demonstrate L shoulder strength >=4+/5.    Time  8  Period  Weeks     Status  On-going   NT d/t precautions     PT LONG TERM GOAL #4   Title  Patient to demonstrate L shoulder overhead lift with 5lbs and good scapular mechanics without pain limiting.    Time  8    Period  Weeks    Status  On-going   NT d/t precautions     PT LONG TERM GOAL #5   Title  Patient to report tolerance of playing guitar for 10 min without pain limiting.    Time  8    Period  Weeks    Status  On-going   unable at this time           Plan - 01/25/19 1114    Clinical Impression Statement  Patient demonstrating good tolerance for progressive L shoulder ROM and strengthening ther-ex. Initiated sitting shoulder AAROM in all planes with wand- demonstrated good ROM and with mild L shoulder hiking at end range of flexion AAROM. Added light weight to sidelying ER and initiated sidelying abduction with good control and form. Patient requiring cues to stop at neutral with ER. Requiring reminder for scapular retraction with prone row and prone extension over counter top. Educated patient on update to HEP- patient reported understanding. Tolerated STM and manual TPR to L infraspinatus/teres group, biceps muscle belly and proximal tendon, and pec with most tightness in proximal biceps and pec. Ended session without modalities d/t good tolerance for today's activities.    Comorbidities  Parkinson's Disease, hypothyroidism, hx of multiple concussions, GERD, benign essential HTN, abnormal gait, L THA 2019, removal of T1 paraspinous mass 2017, T11-12 and L2-5 lumbar lami/decompression 2015, cervical fusion C3-T1 2013    PT Treatment/Interventions  ADLs/Self Care Home Management;Therapeutic exercise;Therapeutic activities;Functional mobility training;DME Instruction;Neuromuscular re-education;Patient/family education;Cryotherapy;Electrical Stimulation;Moist Heat;Ultrasound;Manual techniques;Vasopneumatic Device;Taping;Energy conservation;Dry needling;Passive range of motion;Scar mobilization    PT  Next Visit Plan  progress per protocol    Consulted and Agree with Plan of Care  Patient       Patient will benefit from skilled therapeutic intervention in order to improve the following deficits and impairments:  Hypomobility, Increased edema, Decreased scar mobility, Decreased activity tolerance, Decreased strength, Impaired UE functional use, Pain, Decreased range of motion, Improper body mechanics, Postural dysfunction, Impaired flexibility  Visit Diagnosis: 1. Acute pain of left shoulder   2. Stiffness of left shoulder, not elsewhere classified   3. Muscle weakness (generalized)   4. Abnormal posture        Problem List There are no active problems to display for this patient.    Janene Harvey, PT, DPT 01/25/19 11:33 AM   [facility] [address]  [place], [place], [address] Phone: [phone]   Fax:  [phone]  Name: Jim Carson MRN: [id number] Date of Birth: [birth year]

## 2019-01-28 ENCOUNTER — Ambulatory Visit: Payer: Medicare Other | Admitting: Physical Therapy

## 2019-01-28 ENCOUNTER — Encounter: Payer: Self-pay | Admitting: Physical Therapy

## 2019-01-28 ENCOUNTER — Other Ambulatory Visit: Payer: Self-pay

## 2019-01-28 DIAGNOSIS — M25512 Pain in left shoulder: Secondary | ICD-10-CM

## 2019-01-28 DIAGNOSIS — R293 Abnormal posture: Secondary | ICD-10-CM

## 2019-01-28 DIAGNOSIS — M25612 Stiffness of left shoulder, not elsewhere classified: Secondary | ICD-10-CM

## 2019-01-28 DIAGNOSIS — M6281 Muscle weakness (generalized): Secondary | ICD-10-CM

## 2019-01-28 NOTE — Therapy (Signed)
[facility] [address]  [place], [place], [address] Phone: [phone]   Fax:  [phone]  Physical Therapy Progress Note  Patient Details  Name: Jim Carson MRN: [id number] Date of Birth: 1950-07-12 Referring Provider (PT): Sydnee Cabal, MD   Progress Note Reporting Period 12/28/18 to 01/28/19  See note below for Objective Data and Assessment of Progress/Goals.    Encounter Date: 01/28/2019  PT End of Session - 01/28/19 1154    Visit Number  10    Number of Visits  17    Date for PT Re-Evaluation  02/22/19    Authorization Type  Medicare and [place] [facility]    PT Start Time  1101    PT Stop Time  1150    PT Time Calculation (min)  49 min    Equipment Utilized During Treatment  --    Activity Tolerance  Patient tolerated treatment well    Behavior During Therapy  WFL for tasks assessed/performed       Past Medical History:  Diagnosis Date  . Abnormal gait    due to parkinson's,  uses cane  . Allergic rhinitis   . Benign essential hypertension   . Chronic constipation   . Deviated septum   . GERD (gastroesophageal reflux disease)   . History of multiple concussions    per pt as teen playing football, no residual  . Hypothyroidism    followed by pcp  . Left shoulder pain   . OA (osteoarthritis)   . OSA on CPAP   . Parkinson disease [facility])    neurologist-- dr b. scott  _0  Neurology  . Vitamin B12 deficiency   . Vitamin D deficiency   . Wears glasses     Past Surgical History:  Procedure Laterality Date  . APPENDECTOMY  07/1964  . LAPAROSCOPIC CHOLECYSTECTOMY  01-21-2017   _1 , [place]  . POSTERIOR FUSION CERVICAL SPINE  08-15-2011   _2 , [place]   w/ laminectomy and decompression-- C3 -- T1  . POSTERIOR LAMINECTOMY / DECOMPRESSION LUMBAR SPINE  07-30-2013   _3 , [place]   bilateral T11 - 12,  L2 --5  . 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 SHOULDER ARTHROSCOPY WITH ROTATOR CUFF REPAIR Left 12/15/2018   Procedure: SHOULDER ARTHROSCOPY, biceps tenodesis labored Debridement, submacromial decompression;  Surgeon: Sydnee Cabal, MD;  Location: [facility];  Service: Orthopedics;  Laterality: Left;  interscalene block  . TOTAL HIP ARTHROPLASTY Left 09/18/2017   _5 , [place]    There were no vitals filed for this visit.  Subjective Assessment - 01/28/19 1104    Subjective  Doing wel- now out of his sling. Asking about clearance to drive. Reports 50% improvement since surgery. Would like to continue working on flexibility and strength.    Pertinent History  Parkinson's Disease, hypothyroidism, hx of multiple concussions, GERD, benign essential HTN, abnormal gait, L THA 2019, removal of T1 paraspinous mass 2017, T11-12 and L2-5 lumbar lami/decompression 2015, cervical fusion C3-T1 2013    Patient Stated Goals  get back to playing the guitar and playing golf    Currently in Pain?  Yes    Pain Score  1     Pain Location  Shoulder    Pain Orientation  Left    Pain Descriptors / Indicators  Sore    Pain Type  Acute pain;Surgical pain  [facility] PT Assessment - 01/28/19 0001      AROM   Right Shoulder Flexion  126 Degrees    Right Shoulder ABduction  110 Degrees    Right Shoulder Internal Rotation  --   FIR NT   Right Shoulder External Rotation  --   FER touching back of head     PROM   Left Shoulder Flexion  147 Degrees    Left Shoulder ABduction  136 Degrees    Left Shoulder Internal Rotation  72 Degrees    Left Shoulder External Rotation  65 Degrees      Strength   Left Shoulder Flexion  4-/5    Left Shoulder ABduction  4-/5    Left Shoulder Internal Rotation  3+/5    Left Shoulder External Rotation  3+/5                   [facility] Adult PT Treatment/Exercise - 01/28/19 0001      Shoulder Exercises: Prone   Retraction  Strengthening;Left;15 reps    Retraction Weight  (lbs)  3    Retraction Limitations  leaning over counter with 3#    Extension  Strengthening;Left;10 reps;Weights    Extension Weight (lbs)  3    Extension Limitations  leaning over counter with 3#      Shoulder Exercises: Standing   Flexion  AROM;Left;10 reps    Flexion Limitations  overhead placing putty on overhead shelf   cues to avoid shoulder hiking   Row  Strengthening;Both;10 reps;Theraband    Theraband Level (Shoulder Row)  Level 2 (Red)    Row Limitations  2x10; cues to stop at neutral, avoid bending elbow into bicep curl    Shoulder Elevation  Strengthening;Left;10 reps;Standing    Shoulder Elevation Limitations  leaning over counter top prone flexion   cues to decrease speed   Other Standing Exercises  L shoulder scaption to 90 deg with thumb up x10      Shoulder Exercises: Pulleys   Flexion  3 minutes    Scaption  3 minutes      Manual Therapy   Manual Therapy  Soft tissue mobilization;Myofascial release    Manual therapy comments  sitting    Soft tissue mobilization  L pec and proximal biceps- large area of taut muscle    Myofascial Release  TPR to L pec and proximal biceps             PT Education - 01/28/19 1154    Education Details  update/consolidation of HEP; discussion on objective progress with PT    Person(s) Educated  Patient    Methods  Explanation;Demonstration;Tactile cues;Verbal cues;Handout    Comprehension  Verbalized understanding;Returned demonstration       PT Short Term Goals - 01/28/19 1106      PT SHORT TERM GOAL #1   Title  Patient to be independent with initial HEP.    Time  3    Period  Weeks    Status  Achieved    Target Date  01/18/19        PT Long Term Goals - 01/28/19 1107      PT LONG TERM GOAL #1   Title  Patient to be independent with advanced HEP.    Time  8    Period  Weeks    Status  Partially Met   met for current     PT LONG TERM GOAL #2   Title  Patient to demonstrate L shoulder  AROM/PROM WFL and  without pain limiting.    Time  8    Period  Weeks    Status  Partially Met   showing improvements in L shoulder abduction, IR, and ER PROM; now able to tolerate flexion, abduction, and ER AROM     PT LONG TERM GOAL #3   Title  Patient to demonstrate L shoulder strength >=4+/5.    Time  8    Period  Weeks    Status  On-going   tolerated strength testing with L shoulder strength limited in all planes, as expected     PT LONG TERM GOAL #4   Title  Patient to demonstrate L shoulder overhead lift with 5lbs and good scapular mechanics without pain limiting.    Time  8    Period  Weeks    Status  Partially Met   tolerated lifting lightweight object to overhead cabinet with L UE with cues to avoid shoulder hiking     PT LONG TERM GOAL #5   Title  Patient to report tolerance of playing guitar for 10 min without pain limiting.    Time  8    Period  Weeks    Status  Partially Met   able to play acoustic guitar while sitting with mild aching in L shoulder           Plan - 01/28/19 1157    Clinical Impression Statement  Patient reported 50% improvement in L shoulder since surgery, noting that he would like to continue working on flexibility and strength. Reporting good tolerance out of the sling now and hoping to start driving soon. Patient is showing improvements in L shoulder abduction, IR, and ER PROM; now able to tolerate flexion, abduction, and ER AROM. Tolerated strength testing with L shoulder strength limited in all planes, as expected for this stage of healing. Patient was able to perform lifting a lightweight object to overhead cabinet with L UE with cues to avoid shoulder hiking. Patient reported no pain with this activity. Notes that he is now able to play acoustic guitar while sitting with mild aching in L shoulder, but tolerable. Progressed periscapular strengthening this session with cues for proper form. Patient reported understanding of all education provided today. Ended  session with STM and manual TPR to L pec and proximal biceps d/t large band of taut and painful muscle. Patient reported some relief with manual therapy. No complaints at end of session. Patient progressing well per POC.    Comorbidities  Parkinson's Disease, hypothyroidism, hx of multiple concussions, GERD, benign essential HTN, abnormal gait, L THA 2019, removal of T1 paraspinous mass 2017, T11-12 and L2-5 lumbar lami/decompression 2015, cervical fusion C3-T1 2013    PT Treatment/Interventions  ADLs/Self Care Home Management;Therapeutic exercise;Therapeutic activities;Functional mobility training;DME Instruction;Neuromuscular re-education;Patient/family education;Cryotherapy;Electrical Stimulation;Moist Heat;Ultrasound;Manual techniques;Vasopneumatic Device;Taping;Energy conservation;Dry needling;Passive range of motion;Scar mobilization    PT Next Visit Plan  progress per protocol    Consulted and Agree with Plan of Care  Patient       Patient will benefit from skilled therapeutic intervention in order to improve the following deficits and impairments:  Hypomobility, Increased edema, Decreased scar mobility, Decreased activity tolerance, Decreased strength, Impaired UE functional use, Pain, Decreased range of motion, Improper body mechanics, Postural dysfunction, Impaired flexibility  Visit Diagnosis: 1. Acute pain of left shoulder   2. Stiffness of left shoulder, not elsewhere classified   3. Muscle weakness (generalized)   4. Abnormal posture  Problem List There are no active problems to display for this patient.    Janene Harvey, PT, DPT 01/28/19 12:02 PM   [facility] [address]  [place], [place], [address] Phone: [phone]   Fax:  [phone]  Name: Shia Eber MRN: [id number] Date of Birth: 1951/01/26

## 2019-02-01 ENCOUNTER — Other Ambulatory Visit: Payer: Self-pay

## 2019-02-01 ENCOUNTER — Encounter: Payer: Self-pay | Admitting: Physical Therapy

## 2019-02-01 ENCOUNTER — Ambulatory Visit: Payer: Medicare Other | Attending: Specialist | Admitting: Physical Therapy

## 2019-02-01 DIAGNOSIS — M6281 Muscle weakness (generalized): Secondary | ICD-10-CM | POA: Diagnosis present

## 2019-02-01 DIAGNOSIS — M25612 Stiffness of left shoulder, not elsewhere classified: Secondary | ICD-10-CM | POA: Diagnosis present

## 2019-02-01 DIAGNOSIS — R293 Abnormal posture: Secondary | ICD-10-CM | POA: Diagnosis present

## 2019-02-01 DIAGNOSIS — M25512 Pain in left shoulder: Secondary | ICD-10-CM | POA: Diagnosis not present

## 2019-02-01 NOTE — Therapy (Addendum)
[facility] [address]  [place], [place], [address] Phone: [phone]   Fax:  [phone]  Physical Therapy Treatment  Patient Details  Name: Jim Carson MRN: [id number] Date of Birth: 01-12-1951 Referring Provider (PT): Sydnee Cabal, MD   Encounter Date: 02/01/2019  PT End of Session - 02/01/19 1206    Visit Number  11    Number of Visits  17    Date for PT Re-Evaluation  02/22/19    Authorization Type  Medicare and [place] Time  0845    PT Stop Time  0939    PT Time Calculation (min)  54 min    Activity Tolerance  Patient tolerated treatment well    Behavior During Therapy  [facility] for tasks assessed/performed       Past Medical History:  Diagnosis Date  . Abnormal gait    due to parkinson's,  uses cane  . Allergic rhinitis   . Benign essential hypertension   . Chronic constipation   . Deviated septum   . GERD (gastroesophageal reflux disease)   . History of multiple concussions    per pt as teen playing football, no residual  . Hypothyroidism    followed by pcp  . Left shoulder pain   . OA (osteoarthritis)   . OSA on CPAP   . Parkinson disease [facility])    neurologist-- dr b. scott  _0  Neurology  . Vitamin B12 deficiency   . Vitamin D deficiency   . Wears glasses     Past Surgical History:  Procedure Laterality Date  . APPENDECTOMY  07/1964  . LAPAROSCOPIC CHOLECYSTECTOMY  01-21-2017   _1 , [place]  . POSTERIOR FUSION CERVICAL SPINE  08-15-2011   _2 , [place]   w/ laminectomy and decompression-- C3 -- T1  . POSTERIOR LAMINECTOMY / DECOMPRESSION LUMBAR SPINE  07-30-2013   _3 , [place]   bilateral T11 - 12,  L2 --5  . 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 SHOULDER ARTHROSCOPY WITH ROTATOR CUFF REPAIR Left 12/15/2018   Procedure: SHOULDER ARTHROSCOPY, biceps tenodesis labored Debridement, submacromial decompression;   Surgeon: Sydnee Cabal, MD;  Location: [facility];  Service: Orthopedics;  Laterality: Left;  interscalene block  . TOTAL HIP ARTHROPLASTY Left 09/18/2017   _5 , [place]    There were no vitals filed for this visit.  Subjective Assessment - 02/01/19 0848    Subjective  L shoulder feels stiff today- must have slept on it wrong.    Pertinent History  Parkinson's Disease, hypothyroidism, hx of multiple concussions, GERD, benign essential HTN, abnormal gait, L THA 2019, removal of T1 paraspinous mass 2017, T11-12 and L2-5 lumbar lami/decompression 2015, cervical fusion C3-T1 2013    Patient Stated Goals  get back to playing the guitar and playing golf    Currently in Pain?  Yes    Pain Score  2     Pain Location  Shoulder    Pain Orientation  Left    Pain Descriptors / Indicators  Sore    Pain Type  Acute pain;Surgical pain                       [facility] Adult PT Treatment/Exercise - 02/01/19 0001      Shoulder Exercises: Seated   Flexion  AROM;Left;10 reps    Flexion Limitations  to tolerance   first  couple reps limited ROM- progressively improving   Abduction  AROM;Left;10 reps    ABduction Weight (lbs)  scaption to 90 deg   cues to sit up tall     Shoulder Exercises: Prone   Flexion  AROM;Left;10 reps    Flexion Limitations  leaning over counter top in limited ROM      Shoulder Exercises: Sidelying   External Rotation  Left;10 reps;Strengthening    External Rotation Weight (lbs)  2    External Rotation Limitations  2x10; cues to stop at neutral and scapular squeeze    ABduction  Strengthening;Left;10 reps;Weights    ABduction Weight (lbs)  1    ABduction Limitations  2x10; within limited range      Shoulder Exercises: Standing   External Rotation  Strengthening;Left;10 reps;Theraband    Theraband Level (Shoulder External Rotation)  Level 1 (Yellow)    External Rotation Limitations  towel roll under elbow; cues to stop at neutral     Internal  Rotation  Strengthening;Left;10 reps;Theraband    Internal Rotation Limitations  towel roll under elbow; cues to stop at neutral     Extension  Strengthening;Both;10 reps;Theraband    Theraband Level (Shoulder Extension)  Level 1 (Yellow)    Extension Limitations  cues for scap retraction    Row  Strengthening;Both;10 reps;Theraband    Theraband Level (Shoulder Row)  Level 2 (Red)    Row Limitations  2x10; cues to stand with wide BOS      Shoulder Exercises: Pulleys   Flexion  3 minutes    Scaption  3 minutes      Vasopneumatic   Number Minutes Vasopneumatic   15 minutes    Vasopnuematic Location   Shoulder   L   Vasopneumatic Pressure  Low    Vasopneumatic Temperature   coldest             PT Education - 02/01/19 0927    Education Details  update to HEP    Person(s) Educated  Patient    Methods  Explanation;Demonstration;Tactile cues;Verbal cues;Handout    Comprehension  Verbalized understanding;Returned demonstration       PT Short Term Goals - 01/28/19 1106      PT SHORT TERM GOAL #1   Title  Patient to be independent with initial HEP.    Time  3    Period  Weeks    Status  Achieved    Target Date  01/18/19        PT Long Term Goals - 01/28/19 1107      PT LONG TERM GOAL #1   Title  Patient to be independent with advanced HEP.    Time  8    Period  Weeks    Status  Partially Met   met for current     PT LONG TERM GOAL #2   Title  Patient to demonstrate L shoulder AROM/PROM WFL and without pain limiting.    Time  8    Period  Weeks    Status  Partially Met   showing improvements in L shoulder abduction, IR, and ER PROM; now able to tolerate flexion, abduction, and ER AROM     PT LONG TERM GOAL #3   Title  Patient to demonstrate L shoulder strength >=4+/5.    Time  8    Period  Weeks    Status  On-going   tolerated strength testing with L shoulder strength limited in all planes, as expected     PT  LONG TERM GOAL #4   Title  Patient to  demonstrate L shoulder overhead lift with 5lbs and good scapular mechanics without pain limiting.    Time  8    Period  Weeks    Status  Partially Met   tolerated lifting lightweight object to overhead cabinet with L UE with cues to avoid shoulder hiking     PT LONG TERM GOAL #5   Title  Patient to report tolerance of playing guitar for 10 min without pain limiting.    Time  8    Period  Weeks    Status  Partially Met   able to play acoustic guitar while sitting with mild aching in L shoulder           Plan - 02/01/19 1207    Clinical Impression Statement  Patient arrived to session with report of stiffness in L shoulder this AM. Tolerated AROM flexion and scaption with good muscle control. Able to stretch into increased flexion with increased reps. Increased weighted resistance with sidelying ER, however patient requiring continued cues to stop at neutral with this exercise. Tolerated periscapular and RTC strengthening HEP with good form and tolerance. Updated HEP with prone flexion over counter top. Patient reported understanding. Ended session with [place] to L shoulder. No complaints at end of session.    Comorbidities  Parkinson's Disease, hypothyroidism, hx of multiple concussions, GERD, benign essential HTN, abnormal gait, L THA 2019, removal of T1 paraspinous mass 2017, T11-12 and L2-5 lumbar lami/decompression 2015, cervical fusion C3-T1 2013    PT Treatment/Interventions  ADLs/Self Care Home Management;Therapeutic exercise;Therapeutic activities;Functional mobility training;DME Instruction;Neuromuscular re-education;Patient/family education;Cryotherapy;Electrical Stimulation;Moist Heat;Ultrasound;Manual techniques;Vasopneumatic Device;Taping;Energy conservation;Dry needling;Passive range of motion;Scar mobilization    PT Next Visit Plan  progress per protocol    Consulted and Agree with Plan of Care  Patient       Patient will benefit from skilled therapeutic intervention in  order to improve the following deficits and impairments:  Hypomobility, Increased edema, Decreased scar mobility, Decreased activity tolerance, Decreased strength, Impaired UE functional use, Pain, Decreased range of motion, Improper body mechanics, Postural dysfunction, Impaired flexibility  Visit Diagnosis: 1. Acute pain of left shoulder   2. Stiffness of left shoulder, not elsewhere classified   3. Muscle weakness (generalized)   4. Abnormal posture        Problem List There are no active problems to display for this patient.    Janene Harvey, PT, DPT 02/01/19 4:30 PM   [facility] [address]  [place], [place], [address] Phone: [phone]   Fax:  [phone]  Name: Jim Carson MRN: [id number] Date of Birth: Jul 23, 1950

## 2019-02-03 ENCOUNTER — Ambulatory Visit: Payer: Medicare Other

## 2019-02-03 ENCOUNTER — Other Ambulatory Visit: Payer: Self-pay

## 2019-02-03 DIAGNOSIS — M25512 Pain in left shoulder: Secondary | ICD-10-CM

## 2019-02-03 DIAGNOSIS — M25612 Stiffness of left shoulder, not elsewhere classified: Secondary | ICD-10-CM

## 2019-02-03 DIAGNOSIS — R293 Abnormal posture: Secondary | ICD-10-CM

## 2019-02-03 DIAGNOSIS — M6281 Muscle weakness (generalized): Secondary | ICD-10-CM

## 2019-02-03 NOTE — Therapy (Addendum)
[facility] [address]  [place], [place], [address] Phone: [phone]   Fax:  [phone]  Physical Therapy Treatment  Patient Details  Name: Jim Carson MRN: [id number] Date of Birth: 1950/11/25 Referring Provider (PT): Sydnee Cabal, MD   Encounter Date: 02/03/2019  PT End of Session - 02/03/19 1025    Visit Number  12    Number of Visits  17    Date for PT Re-Evaluation  02/22/19    Authorization Type  Medicare and [place] Time  1020    PT Stop Time  1100    PT Time Calculation (min)  40 min    Activity Tolerance  Patient tolerated treatment well    Behavior During Therapy  [facility] for tasks assessed/performed       Past Medical History:  Diagnosis Date  . Abnormal gait    due to parkinson's,  uses cane  . Allergic rhinitis   . Benign essential hypertension   . Chronic constipation   . Deviated septum   . GERD (gastroesophageal reflux disease)   . History of multiple concussions    per pt as teen playing football, no residual  . Hypothyroidism    followed by pcp  . Left shoulder pain   . OA (osteoarthritis)   . OSA on CPAP   . Parkinson disease [facility])    neurologist-- dr b. scott  _0  Neurology  . Vitamin B12 deficiency   . Vitamin D deficiency   . Wears glasses     Past Surgical History:  Procedure Laterality Date  . APPENDECTOMY  07/1964  . LAPAROSCOPIC CHOLECYSTECTOMY  01-21-2017   _1 , [place]  . POSTERIOR FUSION CERVICAL SPINE  08-15-2011   _2 , [place]   w/ laminectomy and decompression-- C3 -- T1  . POSTERIOR LAMINECTOMY / DECOMPRESSION LUMBAR SPINE  07-30-2013   _3 , [place]   bilateral T11 - 12,  L2 --5  . 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 SHOULDER ARTHROSCOPY WITH ROTATOR CUFF REPAIR Left 12/15/2018   Procedure: SHOULDER ARTHROSCOPY, biceps tenodesis labored Debridement, submacromial decompression;   Surgeon: Sydnee Cabal, MD;  Location: [facility];  Service: Orthopedics;  Laterality: Left;  interscalene block  . TOTAL HIP ARTHROPLASTY Left 09/18/2017   _5 , [place]    There were no vitals filed for this visit.  Subjective Assessment - 02/03/19 1024    Subjective  Pt. reporting L shoulder has been feeling better recently.    Pertinent History  Parkinson's Disease, hypothyroidism, hx of multiple concussions, GERD, benign essential HTN, abnormal gait, L THA 2019, removal of T1 paraspinous mass 2017, T11-12 and L2-5 lumbar lami/decompression 2015, cervical fusion C3-T1 2013    Patient Stated Goals  get back to playing the guitar and playing golf    Currently in Pain?  No/denies    Pain Score  0-No pain    Multiple Pain Sites  No                       [facility] Adult PT Treatment/Exercise - 02/03/19 0001      Shoulder Exercises: Sidelying   External Rotation  Left;10 reps;Strengthening    External Rotation Weight (lbs)  2    ABduction  Left;AROM;12 reps;Weights    ABduction Weight (lbs)  1      Shoulder Exercises: Standing   External Rotation  Strengthening;Left;10 reps;Theraband    Theraband Level (Shoulder External Rotation)  Level 1 (Yellow)    External Rotation Limitations  towel roll under elbow; cues to stop at neutral     Extension  15 reps;Theraband;Strengthening    Theraband Level (Shoulder Extension)  Level 1 (Yellow)    Extension Limitations  Heavy tactile cueing for full scapular retraction with good carryover     Row  15 reps;Strengthening;Theraband    Theraband Level (Shoulder Row)  Level 2 (Red)    Row Limitations  Staggered stance       Shoulder Exercises: Pulleys   Flexion  3 minutes    Scaption  3 minutes      Manual Therapy   Manual Therapy  Soft tissue mobilization;Passive ROM    Manual therapy comments  supine     Joint Mobilization  gentle grade III L shoulder anterior, posterior, inferior joint mobilizations    Soft  tissue mobilization  STM to L pec, L anterior deltoid, L subscap, L lateral deltoid     Passive ROM  L shoulder PROM with gentle holds/stretch at end ranges; Manual L pec stretch in supine in varying angles with therapist L UE support off table x 30 sec each way                PT Short Term Goals - 01/28/19 1106      PT SHORT TERM GOAL #1   Title  Patient to be independent with initial HEP.    Time  3    Period  Weeks    Status  Achieved    Target Date  01/18/19        PT Long Term Goals - 01/28/19 1107      PT LONG TERM GOAL #1   Title  Patient to be independent with advanced HEP.    Time  8    Period  Weeks    Status  Partially Met   met for current     PT LONG TERM GOAL #2   Title  Patient to demonstrate L shoulder AROM/PROM WFL and without pain limiting.    Time  8    Period  Weeks    Status  Partially Met   showing improvements in L shoulder abduction, IR, and ER PROM; now able to tolerate flexion, abduction, and ER AROM     PT LONG TERM GOAL #3   Title  Patient to demonstrate L shoulder strength >=4+/5.    Time  8    Period  Weeks    Status  On-going   tolerated strength testing with L shoulder strength limited in all planes, as expected     PT LONG TERM GOAL #4   Title  Patient to demonstrate L shoulder overhead lift with 5lbs and good scapular mechanics without pain limiting.    Time  8    Period  Weeks    Status  Partially Met   tolerated lifting lightweight object to overhead cabinet with L UE with cues to avoid shoulder hiking     PT LONG TERM GOAL #5   Title  Patient to report tolerance of playing guitar for 10 min without pain limiting.    Time  8    Period  Weeks    Status  Partially Met   able to play acoustic guitar while sitting with mild aching in L shoulder           Plan - 02/03/19 1026    Clinical  Impression Statement  Jim Carson reporting L shoulder has felt, "pretty good" since last session.  Tolerated mild progression of  scapular and RTC strengthening activities well today per protocol.  MT addressing ongoing tightness in L anterior shoulder, pec, and deltoid with gentle manual stretching with prolonged holds performed and visible improvement in L shoulder PROM following this.  Ended visit with pt. reporting he was pain free thus modalities differed.  Progressing well per protocol.    Personal Factors and Comorbidities  Age;Comorbidity 3+;Time since onset of injury/illness/exacerbation;Fitness;Past/Current Experience    Comorbidities  Parkinson's Disease, hypothyroidism, hx of multiple concussions, GERD, benign essential HTN, abnormal gait, L THA 2019, removal of T1 paraspinous mass 2017, T11-12 and L2-5 lumbar lami/decompression 2015, cervical fusion C3-T1 2013    Rehab Potential  Good    PT Treatment/Interventions  ADLs/Self Care Home Management;Therapeutic exercise;Therapeutic activities;Functional mobility training;DME Instruction;Neuromuscular re-education;Patient/family education;Cryotherapy;Electrical Stimulation;Moist Heat;Ultrasound;Manual techniques;Vasopneumatic Device;Taping;Energy conservation;Dry needling;Passive range of motion;Scar mobilization    PT Next Visit Plan  progress per protocol    Consulted and Agree with Plan of Care  Patient       Patient will benefit from skilled therapeutic intervention in order to improve the following deficits and impairments:  Hypomobility, Increased edema, Decreased scar mobility, Decreased activity tolerance, Decreased strength, Impaired UE functional use, Pain, Decreased range of motion, Improper body mechanics, Postural dysfunction, Impaired flexibility  Visit Diagnosis: 1. Acute pain of left shoulder   2. Stiffness of left shoulder, not elsewhere classified   3. Muscle weakness (generalized)   4. Abnormal posture        Problem List There are no active problems to display for this patient.   Bess Harvest, PTA 02/03/19 12:26 PM   [facility] [address]  [place], [place], [address] Phone: [phone]   Fax:  [phone]  Name: Jim Carson MRN: [id number] Date of Birth: Apr 17, 1951

## 2019-02-08 ENCOUNTER — Encounter: Payer: Medicare Other | Admitting: Physical Therapy

## 2019-02-09 ENCOUNTER — Ambulatory Visit: Payer: Medicare Other

## 2019-02-09 ENCOUNTER — Other Ambulatory Visit: Payer: Self-pay

## 2019-02-09 DIAGNOSIS — R293 Abnormal posture: Secondary | ICD-10-CM

## 2019-02-09 DIAGNOSIS — M25512 Pain in left shoulder: Secondary | ICD-10-CM

## 2019-02-09 DIAGNOSIS — M25612 Stiffness of left shoulder, not elsewhere classified: Secondary | ICD-10-CM

## 2019-02-09 DIAGNOSIS — M6281 Muscle weakness (generalized): Secondary | ICD-10-CM

## 2019-02-09 NOTE — Therapy (Addendum)
[facility] [address]  [place], [place], [address] Phone: [phone]   Fax:  [phone]  Physical Therapy Treatment  Patient Details  Name: Jim Carson MRN: [id number] Date of Birth: 1950/07/15 Referring Provider (PT): Sydnee Cabal, MD   Encounter Date: 02/09/2019  PT End of Session - 02/09/19 0940    Visit Number  13    Number of Visits  17    Date for PT Re-Evaluation  02/22/19    Authorization Type  Medicare and [place] Time  0930    PT Stop Time  1023    PT Time Calculation (min)  53 min    Activity Tolerance  Patient tolerated treatment well    Behavior During Therapy  [facility] for tasks assessed/performed       Past Medical History:  Diagnosis Date  . Abnormal gait    due to parkinson's,  uses cane  . Allergic rhinitis   . Benign essential hypertension   . Chronic constipation   . Deviated septum   . GERD (gastroesophageal reflux disease)   . History of multiple concussions    per pt as teen playing football, no residual  . Hypothyroidism    followed by pcp  . Left shoulder pain   . OA (osteoarthritis)   . OSA on CPAP   . Parkinson disease [facility])    neurologist-- dr b. scott  _0  Neurology  . Vitamin B12 deficiency   . Vitamin D deficiency   . Wears glasses     Past Surgical History:  Procedure Laterality Date  . APPENDECTOMY  07/1964  . LAPAROSCOPIC CHOLECYSTECTOMY  01-21-2017   _1 , [place]  . POSTERIOR FUSION CERVICAL SPINE  08-15-2011   _2 , [place]   w/ laminectomy and decompression-- C3 -- T1  . POSTERIOR LAMINECTOMY / DECOMPRESSION LUMBAR SPINE  07-30-2013   _3 , [place]   bilateral T11 - 12,  L2 --5  . 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 SHOULDER ARTHROSCOPY WITH ROTATOR CUFF REPAIR Left 12/15/2018   Procedure: SHOULDER ARTHROSCOPY, biceps tenodesis labored Debridement, submacromial decompression;   Surgeon: Sydnee Cabal, MD;  Location: [facility];  Service: Orthopedics;  Laterality: Left;  interscalene block  . TOTAL HIP ARTHROPLASTY Left 09/18/2017   _5 , [place]    There were no vitals filed for this visit.  Subjective Assessment - 02/09/19 0935    Subjective  Pt. reporting he drove half way to the beach on Thursday and had some shoulder soreness over the weekend.    Pertinent History  Parkinson's Disease, hypothyroidism, hx of multiple concussions, GERD, benign essential HTN, abnormal gait, L THA 2019, removal of T1 paraspinous mass 2017, T11-12 and L2-5 lumbar lami/decompression 2015, cervical fusion C3-T1 2013    Patient Stated Goals  get back to playing the guitar and playing golf    Currently in Pain?  Yes    Pain Score  1     Pain Location  Shoulder    Pain Orientation  Left    Pain Descriptors / Indicators  Sore    Pain Type  Acute pain;Surgical pain    Multiple Pain Sites  No                       [facility] Adult PT Treatment/Exercise - 02/09/19 0001      Shoulder Exercises: Sidelying  Other Sidelying Exercises  L horizontal abduction (no weight) focusing on scapular retraction x 10 rpes; with tactile cueing provided       Shoulder Exercises: Standing   External Rotation  Left;Theraband;Strengthening;15 reps    Theraband Level (Shoulder External Rotation)  Level 1 (Yellow)    Internal Rotation  Left;15 reps;Theraband;Strengthening    Theraband Level (Shoulder Internal Rotation)  Level 1 (Yellow)    Extension  15 reps;Theraband;Strengthening    Theraband Level (Shoulder Extension)  Level 1 (Yellow)    Extension Limitations  Heavy tactile cueing for full scapular retraction with good carryover     Row  15 reps;Strengthening;Theraband    Theraband Level (Shoulder Row)  Level 2 (Red)      Vasopneumatic   Number Minutes Vasopneumatic   10 minutes    Vasopnuematic Location   Shoulder    Vasopneumatic Pressure  Low    Vasopneumatic  Temperature   coldest      Manual Therapy   Manual Therapy  Soft tissue mobilization;Passive ROM    Manual therapy comments  supine     Joint Mobilization  gentle grade III L shoulder anterior, posterior, inferior joint mobilizations    Soft tissue mobilization  STM to L pec, L anterior deltoid, L lateral deltoid     Passive ROM  L shoulder PROM with gentle holds/stretch at end ranges; Manual L pec stretch in supine in varying angles with therapist L UE support off table x 30 sec each way              PT Education - 02/09/19 1248    Education Details  HEP update    Person(s) Educated  Patient    Methods  Explanation;Demonstration;Verbal cues;Handout    Comprehension  Verbalized understanding;Returned demonstration;Verbal cues required       PT Short Term Goals - 01/28/19 1106      PT SHORT TERM GOAL #1   Title  Patient to be independent with initial HEP.    Time  3    Period  Weeks    Status  Achieved    Target Date  01/18/19        PT Long Term Goals - 01/28/19 1107      PT LONG TERM GOAL #1   Title  Patient to be independent with advanced HEP.    Time  8    Period  Weeks    Status  Partially Met   met for current     PT LONG TERM GOAL #2   Title  Patient to demonstrate L shoulder AROM/PROM WFL and without pain limiting.    Time  8    Period  Weeks    Status  Partially Met   showing improvements in L shoulder abduction, IR, and ER PROM; now able to tolerate flexion, abduction, and ER AROM     PT LONG TERM GOAL #3   Title  Patient to demonstrate L shoulder strength >=4+/5.    Time  8    Period  Weeks    Status  On-going   tolerated strength testing with L shoulder strength limited in all planes, as expected     PT LONG TERM GOAL #4   Title  Patient to demonstrate L shoulder overhead lift with 5lbs and good scapular mechanics without pain limiting.    Time  8    Period  Weeks    Status  Partially Met   tolerated lifting lightweight object to overhead  cabinet with L  UE with cues to avoid shoulder hiking     PT LONG TERM GOAL #5   Title  Patient to report tolerance of playing guitar for 10 min without pain limiting.    Time  8    Period  Weeks    Status  Partially Met   able to play acoustic guitar while sitting with mild aching in L shoulder           Plan - 02/09/19 0936    Clinical Impression Statement  Pt. noting he drove half way to the beach on Thursday of last week and did some swimming in the pool over weekend which may have made his shoulder sore.  Feels like he has recovered from his shoulder soreness from over the weekend today.  Tolerated mild progression of RTC/scapular strengthening activities well today.  Does continue to require close supervision/cueing from therapist for proper scapular mechanics with retraction activities as pt. demo's excessive scapular elevation without cueing.  Ended visit with ice/compression to L shoulder as mild shoulder soreness present to end session.  Pt. progressing well per protocol.    Personal Factors and Comorbidities  Age;Comorbidity 3+;Time since onset of injury/illness/exacerbation;Fitness;Past/Current Experience    Comorbidities  Parkinson's Disease, hypothyroidism, hx of multiple concussions, GERD, benign essential HTN, abnormal gait, L THA 2019, removal of T1 paraspinous mass 2017, T11-12 and L2-5 lumbar lami/decompression 2015, cervical fusion C3-T1 2013    Rehab Potential  Good    PT Treatment/Interventions  ADLs/Self Care Home Management;Therapeutic exercise;Therapeutic activities;Functional mobility training;DME Instruction;Neuromuscular re-education;Patient/family education;Cryotherapy;Electrical Stimulation;Moist Heat;Ultrasound;Manual techniques;Vasopneumatic Device;Taping;Energy conservation;Dry needling;Passive range of motion;Scar mobilization    PT Next Visit Plan  progress per protocol    Consulted and Agree with Plan of Care  Patient       Patient will benefit from  skilled therapeutic intervention in order to improve the following deficits and impairments:  Hypomobility, Increased edema, Decreased scar mobility, Decreased activity tolerance, Decreased strength, Impaired UE functional use, Pain, Decreased range of motion, Improper body mechanics, Postural dysfunction, Impaired flexibility  Visit Diagnosis: 1. Acute pain of left shoulder   2. Stiffness of left shoulder, not elsewhere classified   3. Muscle weakness (generalized)   4. Abnormal posture        Problem List There are no active problems to display for this patient.   Bess Harvest, PTA 02/09/19 4:51 PM    [facility] [address]  [place], [place], [address] Phone: [phone]   Fax:  [phone]  Name: Jim Carson MRN: [id number] Date of Birth: [birth year]

## 2019-02-11 ENCOUNTER — Other Ambulatory Visit: Payer: Self-pay

## 2019-02-11 ENCOUNTER — Ambulatory Visit: Payer: Medicare Other

## 2019-02-11 DIAGNOSIS — R293 Abnormal posture: Secondary | ICD-10-CM

## 2019-02-11 DIAGNOSIS — M25612 Stiffness of left shoulder, not elsewhere classified: Secondary | ICD-10-CM

## 2019-02-11 DIAGNOSIS — M25512 Pain in left shoulder: Secondary | ICD-10-CM

## 2019-02-11 DIAGNOSIS — M6281 Muscle weakness (generalized): Secondary | ICD-10-CM

## 2019-02-11 NOTE — Therapy (Signed)
[facility] [address]  [place], [place], [address] Phone: [phone]   Fax:  [phone]  Physical Therapy Treatment  Patient Details  Name: Jim Carson MRN: [id number] Date of Birth: 1951-03-09 Referring Provider (PT): Sydnee Cabal, MD   Encounter Date: 02/11/2019  PT End of Session - 02/11/19 1033    Visit Number  14    Number of Visits  17    Date for PT Re-Evaluation  02/22/19    Authorization Type  Medicare and [place] Time  9323    PT Stop Time  1102    PT Time Calculation (min)  47 min    Activity Tolerance  Patient tolerated treatment well    Behavior During Therapy  [facility] for tasks assessed/performed       Past Medical History:  Diagnosis Date  . Abnormal gait    due to parkinson's,  uses cane  . Allergic rhinitis   . Benign essential hypertension   . Chronic constipation   . Deviated septum   . GERD (gastroesophageal reflux disease)   . History of multiple concussions    per pt as teen playing football, no residual  . Hypothyroidism    followed by pcp  . Left shoulder pain   . OA (osteoarthritis)   . OSA on CPAP   . Parkinson disease [facility])    neurologist-- dr b. scott  _0  Neurology  . Vitamin B12 deficiency   . Vitamin D deficiency   . Wears glasses     Past Surgical History:  Procedure Laterality Date  . APPENDECTOMY  07/1964  . LAPAROSCOPIC CHOLECYSTECTOMY  01-21-2017   _1 , [place]  . POSTERIOR FUSION CERVICAL SPINE  08-15-2011   _2 , [place]   w/ laminectomy and decompression-- C3 -- T1  . POSTERIOR LAMINECTOMY / DECOMPRESSION LUMBAR SPINE  07-30-2013   _3 , [place]   bilateral T11 - 12,  L2 --5  . 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 SHOULDER ARTHROSCOPY WITH ROTATOR CUFF REPAIR Left 12/15/2018   Procedure: SHOULDER ARTHROSCOPY, biceps tenodesis labored Debridement, submacromial decompression;   Surgeon: Sydnee Cabal, MD;  Location: [facility];  Service: Orthopedics;  Laterality: Left;  interscalene block  . TOTAL HIP ARTHROPLASTY Left 09/18/2017   _5 , [place]    There were no vitals filed for this visit.  Subjective Assessment - 02/11/19 1031    Subjective  Doing well today.  No new complaints.    Pertinent History  Parkinson's Disease, hypothyroidism, hx of multiple concussions, GERD, benign essential HTN, abnormal gait, L THA 2019, removal of T1 paraspinous mass 2017, T11-12 and L2-5 lumbar lami/decompression 2015, cervical fusion C3-T1 2013    Patient Stated Goals  get back to playing the guitar and playing golf    Currently in Pain?  Yes   .5/10   Pain Score  --   .5/10   Pain Location  Shoulder    Pain Orientation  Left    Pain Descriptors / Indicators  Sore    Pain Type  Acute pain;Surgical pain    Multiple Pain Sites  No                       [facility] Adult PT Treatment/Exercise - 02/11/19 0001      Shoulder Exercises: Supine   Protraction  Both;15 reps;Weights    Protraction  Weight (lbs)  2      Shoulder Exercises: Seated   Flexion  Both;10 reps    Flexion Limitations  Cues to avoid excessive scapular elevation       Shoulder Exercises: Sidelying   Other Sidelying Exercises  L horizontal abduction (no weight) focusing on scapular retraction x 15 rpes; with tactile cueing provided       Shoulder Exercises: Standing   External Rotation  Left;20 reps;Strengthening;Theraband    Theraband Level (Shoulder External Rotation)  Level 1 (Yellow)    Internal Rotation  Left;20 reps;Theraband;Strengthening    Theraband Level (Shoulder Internal Rotation)  Level 1 (Yellow)      Shoulder Exercises: Pulleys   Flexion  3 minutes    Scaption  3 minutes      Shoulder Exercises: ROM/Strengthening   Wall Wash  L shld wall wash flexion, scaption x 15 rpes each way    pain free     Shoulder Exercises: Stretch   Corner Stretch  2 reps;30  seconds    Corner Stretch Limitations  low, mid              PT Education - 02/11/19 1237    Education Details  HEP update; shoulder IR/ER pt. issued a yellow TB and instructed to perform with this band until further notice    Person(s) Educated  Patient    Methods  Explanation;Demonstration;Verbal cues;Handout    Comprehension  Verbalized understanding;Returned demonstration;Verbal cues required       PT Short Term Goals - 01/28/19 1106      PT SHORT TERM GOAL #1   Title  Patient to be independent with initial HEP.    Time  3    Period  Weeks    Status  Achieved    Target Date  01/18/19        PT Long Term Goals - 01/28/19 1107      PT LONG TERM GOAL #1   Title  Patient to be independent with advanced HEP.    Time  8    Period  Weeks    Status  Partially Met   met for current     PT LONG TERM GOAL #2   Title  Patient to demonstrate L shoulder AROM/PROM WFL and without pain limiting.    Time  8    Period  Weeks    Status  Partially Met   showing improvements in L shoulder abduction, IR, and ER PROM; now able to tolerate flexion, abduction, and ER AROM     PT LONG TERM GOAL #3   Title  Patient to demonstrate L shoulder strength >=4+/5.    Time  8    Period  Weeks    Status  On-going   tolerated strength testing with L shoulder strength limited in all planes, as expected     PT LONG TERM GOAL #4   Title  Patient to demonstrate L shoulder overhead lift with 5lbs and good scapular mechanics without pain limiting.    Time  8    Period  Weeks    Status  Partially Met   tolerated lifting lightweight object to overhead cabinet with L UE with cues to avoid shoulder hiking     PT LONG TERM GOAL #5   Title  Patient to report tolerance of playing guitar for 10 min without pain limiting.    Time  8    Period  Weeks    Status  Partially Met   able  to play acoustic guitar while sitting with mild aching in L shoulder           Plan - 02/11/19 1033     Clinical Impression Statement  Session focusing on improving Jim Carson's scapular mechanics with retraction and elevation activities for improved glenohumeral rhythm.  Pt. still requiring frequent tactile cueing with scapular motion to ensure full retraction/depression with elevation to avoid impingement positioning. Pt. remains pain free throughout session today and seems to be progressing well per protocol.    Personal Factors and Comorbidities  Age;Comorbidity 3+;Time since onset of injury/illness/exacerbation;Fitness;Past/Current Experience    Comorbidities  Parkinson's Disease, hypothyroidism, hx of multiple concussions, GERD, benign essential HTN, abnormal gait, L THA 2019, removal of T1 paraspinous mass 2017, T11-12 and L2-5 lumbar lami/decompression 2015, cervical fusion C3-T1 2013    Rehab Potential  Good    PT Treatment/Interventions  ADLs/Self Care Home Management;Therapeutic exercise;Therapeutic activities;Functional mobility training;DME Instruction;Neuromuscular re-education;Patient/family education;Cryotherapy;Electrical Stimulation;Moist Heat;Ultrasound;Manual techniques;Vasopneumatic Device;Taping;Energy conservation;Dry needling;Passive range of motion;Scar mobilization    PT Next Visit Plan  progress per protocol    Consulted and Agree with Plan of Care  Patient       Patient will benefit from skilled therapeutic intervention in order to improve the following deficits and impairments:  Hypomobility, Increased edema, Decreased scar mobility, Decreased activity tolerance, Decreased strength, Impaired UE functional use, Pain, Decreased range of motion, Improper body mechanics, Postural dysfunction, Impaired flexibility  Visit Diagnosis: 1. Acute pain of left shoulder   2. Stiffness of left shoulder, not elsewhere classified   3. Muscle weakness (generalized)   4. Abnormal posture        Problem List There are no active problems to display for this patient.   Bess Harvest,  PTA 02/11/19 12:42 PM   [facility] [address]  [place], [place], [address] Phone: [phone]   Fax:  [phone]  Name: Jim Carson MRN: [id number] Date of Birth: 02/22/1951

## 2019-02-15 ENCOUNTER — Other Ambulatory Visit: Payer: Self-pay

## 2019-02-15 ENCOUNTER — Ambulatory Visit: Payer: Medicare Other

## 2019-02-15 DIAGNOSIS — M25512 Pain in left shoulder: Secondary | ICD-10-CM | POA: Diagnosis not present

## 2019-02-15 DIAGNOSIS — M6281 Muscle weakness (generalized): Secondary | ICD-10-CM

## 2019-02-15 DIAGNOSIS — R293 Abnormal posture: Secondary | ICD-10-CM

## 2019-02-15 DIAGNOSIS — M25612 Stiffness of left shoulder, not elsewhere classified: Secondary | ICD-10-CM

## 2019-02-15 NOTE — Therapy (Addendum)
[facility] [address]  [place], [place], [address] Phone: [phone]   Fax:  [phone]  Physical Therapy Treatment  Patient Details  Name: Jim Carson MRN: [id number] Date of Birth: 1950/08/11 Referring Provider (PT): Sydnee Cabal, MD   Encounter Date: 02/15/2019  PT End of Session - 02/15/19 1026    Visit Number  15    Number of Visits  17    Date for PT Re-Evaluation  02/22/19    Authorization Type  Medicare and [place] Time  1020    PT Stop Time  1100    PT Time Calculation (min)  40 min    Activity Tolerance  Patient tolerated treatment well    Behavior During Therapy  [facility] for tasks assessed/performed       Past Medical History:  Diagnosis Date  . Abnormal gait    due to parkinson's,  uses cane  . Allergic rhinitis   . Benign essential hypertension   . Chronic constipation   . Deviated septum   . GERD (gastroesophageal reflux disease)   . History of multiple concussions    per pt as teen playing football, no residual  . Hypothyroidism    followed by pcp  . Left shoulder pain   . OA (osteoarthritis)   . OSA on CPAP   . Parkinson disease [facility])    neurologist-- dr b. scott  '@[facility]'$  Neurology  . Vitamin B12 deficiency   . Vitamin D deficiency   . Wears glasses     Past Surgical History:  Procedure Laterality Date  . APPENDECTOMY  07/1964  . LAPAROSCOPIC CHOLECYSTECTOMY  01-21-2017   '@[facility]'$ , [place]  . POSTERIOR FUSION CERVICAL SPINE  08-15-2011   '@[facility]'$ , [place]   w/ laminectomy and decompression-- C3 -- T1  . POSTERIOR LAMINECTOMY / DECOMPRESSION LUMBAR SPINE  07-30-2013   '@[facility] , [place]   bilateral T11 - 12,  L2 --5  . 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 SHOULDER ARTHROSCOPY WITH ROTATOR CUFF REPAIR Left 12/15/2018   Procedure: SHOULDER ARTHROSCOPY, biceps tenodesis labored Debridement, submacromial decompression;   Surgeon: Sydnee Cabal, MD;  Location: [facility];  Service: Orthopedics;  Laterality: Left;  interscalene block  . TOTAL HIP ARTHROPLASTY Left 09/18/2017   '@[facility]'$ , [place]    There were no vitals filed for this visit.  Subjective Assessment - 02/15/19 1035    Subjective  Pt. reporting some short-lasting shoulder soreness after performing band resisted IR at home.    Pertinent History  Parkinson's Disease, hypothyroidism, hx of multiple concussions, GERD, benign essential HTN, abnormal gait, L THA 2019, removal of T1 paraspinous mass 2017, T11-12 and L2-5 lumbar lami/decompression 2015, cervical fusion C3-T1 2013    Patient Stated Goals  get back to playing the guitar and playing golf    Currently in Pain?  No/denies    Pain Score  0-No pain    Multiple Pain Sites  No                       [facility] Adult PT Treatment/Exercise - 02/15/19 0001      Shoulder Exercises: Standing   External Rotation  Left;20 reps;Strengthening;Theraband    Theraband Level (Shoulder External Rotation)  Level 1 (Yellow)    Internal Rotation  Left;10 reps;Strengthening;Theraband    Theraband Level (Shoulder Internal Rotation)  Level 2 (Red)  Flexion  Left;10 reps;AAROM    Flexion Limitations  orange p-ball on wall; 1# at wrist     ABduction  Left;5 reps;AAROM;Weights    ABduction Limitations  orange p-ball roll on wall; 1# at wrist     Extension  Both;10 reps    Theraband Level (Shoulder Extension)  Level 2 (Red)    Row  Both;20 reps;Strengthening;Theraband    Theraband Level (Shoulder Row)  Level 2 (Red)    Other Standing Exercises  Standing R shoulder rhythmic stabilization with sustained R shoulder flexion to 90 dg leaning on orange p-wall on wall + therapist perturbations to ball x 30 sec       Shoulder Exercises: Pulleys   Flexion  3 minutes    Scaption  3 minutes      Shoulder Exercises: ROM/Strengthening   Wall Wash  L shld wall wash flexion, scaption x 10 rpes each  way       Shoulder Exercises: Isometric Strengthening   Other Isometric Exercises  L biceps isometrics 5" x 10 reps       Shoulder Exercises: Stretch   Corner Stretch  2 reps;30 seconds    Corner Stretch Limitations  low                PT Short Term Goals - 01/28/19 1106      PT SHORT TERM GOAL #1   Title  Patient to be independent with initial HEP.    Time  3    Period  Weeks    Status  Achieved    Target Date  01/18/19        PT Long Term Goals - 01/28/19 1107      PT LONG TERM GOAL #1   Title  Patient to be independent with advanced HEP.    Time  8    Period  Weeks    Status  Partially Met   met for current     PT LONG TERM GOAL #2   Title  Patient to demonstrate L shoulder AROM/PROM WFL and without pain limiting.    Time  8    Period  Weeks    Status  Partially Met   showing improvements in L shoulder abduction, IR, and ER PROM; now able to tolerate flexion, abduction, and ER AROM     PT LONG TERM GOAL #3   Title  Patient to demonstrate L shoulder strength >=4+/5.    Time  8    Period  Weeks    Status  On-going   tolerated strength testing with L shoulder strength limited in all planes, as expected     PT LONG TERM GOAL #4   Title  Patient to demonstrate L shoulder overhead lift with 5lbs and good scapular mechanics without pain limiting.    Time  8    Period  Weeks    Status  Partially Met   tolerated lifting lightweight object to overhead cabinet with L UE with cues to avoid shoulder hiking     PT LONG TERM GOAL #5   Title  Patient to report tolerance of playing guitar for 10 min without pain limiting.    Time  8    Period  Weeks    Status  Partially Met   able to play acoustic guitar while sitting with mild aching in L shoulder           Plan - 02/15/19 1028    Clinical Impression Statement  Pt. doing well today  reporting brief bout of soreness with IR band HEP activity at home yesterday which subsided.  Tolerated progression of  elevation activities both AAROM/AROM well today however does require cueing for proper scapular mechanics at times.  Progressing well with IR/ER strengthening activities.  Only complaint to end session today was L shoulder fatigue.  Progressing well pre protocol.    Personal Factors and Comorbidities  Age;Comorbidity 3+;Time since onset of injury/illness/exacerbation;Fitness;Past/Current Experience    Comorbidities  Parkinson's Disease, hypothyroidism, hx of multiple concussions, GERD, benign essential HTN, abnormal gait, L THA 2019, removal of T1 paraspinous mass 2017, T11-12 and L2-5 lumbar lami/decompression 2015, cervical fusion C3-T1 2013    Rehab Potential  Good    PT Treatment/Interventions  ADLs/Self Care Home Management;Therapeutic exercise;Therapeutic activities;Functional mobility training;DME Instruction;Neuromuscular re-education;Patient/family education;Cryotherapy;Electrical Stimulation;Moist Heat;Ultrasound;Manual techniques;Vasopneumatic Device;Taping;Energy conservation;Dry needling;Passive range of motion;Scar mobilization    PT Next Visit Plan  progress per protocol    Consulted and Agree with Plan of Care  Patient       Patient will benefit from skilled therapeutic intervention in order to improve the following deficits and impairments:  Hypomobility, Increased edema, Decreased scar mobility, Decreased activity tolerance, Decreased strength, Impaired UE functional use, Pain, Decreased range of motion, Improper body mechanics, Postural dysfunction, Impaired flexibility  Visit Diagnosis: 1. Acute pain of left shoulder   2. Stiffness of left shoulder, not elsewhere classified   3. Muscle weakness (generalized)   4. Abnormal posture        Problem List There are no active problems to display for this patient.   Bess Harvest, PTA 02/15/19 12:43 PM   [facility] [address]  [place], [place],  [address] Phone: [phone]   Fax:  [phone]  Name: Jim Carson MRN: [id number] Date of Birth: 11-21-1950

## 2019-02-18 ENCOUNTER — Other Ambulatory Visit: Payer: Self-pay

## 2019-02-18 ENCOUNTER — Encounter: Payer: Self-pay | Admitting: Physical Therapy

## 2019-02-18 ENCOUNTER — Ambulatory Visit: Payer: Medicare Other | Admitting: Physical Therapy

## 2019-02-18 DIAGNOSIS — M25512 Pain in left shoulder: Secondary | ICD-10-CM | POA: Diagnosis not present

## 2019-02-18 DIAGNOSIS — M6281 Muscle weakness (generalized): Secondary | ICD-10-CM

## 2019-02-18 DIAGNOSIS — R293 Abnormal posture: Secondary | ICD-10-CM

## 2019-02-18 DIAGNOSIS — M25612 Stiffness of left shoulder, not elsewhere classified: Secondary | ICD-10-CM

## 2019-02-18 NOTE — Therapy (Addendum)
[facility] [address]  [place], [place], [address] Phone: [phone]   Fax:  [phone]  Physical Therapy Treatment  Patient Details  Name: Jim Carson MRN: [id number] Date of Birth: [birth year] Referring Provider (PT): Sydnee Cabal, MD   Encounter Date: 02/18/2019  PT End of Session - 02/18/19 1057    Visit Number  16    Number of Visits  20    Date for PT Re-Evaluation  03/18/19    Authorization Type  Medicare and [place] Time  8099    PT Stop Time  1056    PT Time Calculation (min)  41 min    Activity Tolerance  Patient tolerated treatment well;Patient limited by pain    Behavior During Therapy  [facility] for tasks assessed/performed       Past Medical History:  Diagnosis Date  . Abnormal gait    due to parkinson's,  uses cane  . Allergic rhinitis   . Benign essential hypertension   . Chronic constipation   . Deviated septum   . GERD (gastroesophageal reflux disease)   . History of multiple concussions    per pt as teen playing football, no residual  . Hypothyroidism    followed by pcp  . Left shoulder pain   . OA (osteoarthritis)   . OSA on CPAP   . Parkinson disease [facility])    neurologist-- dr b. scott  _0  Neurology  . Vitamin B12 deficiency   . Vitamin D deficiency   . Wears glasses     Past Surgical History:  Procedure Laterality Date  . APPENDECTOMY  07/1964  . LAPAROSCOPIC CHOLECYSTECTOMY  01-21-2017   _1 , [place]  . POSTERIOR FUSION CERVICAL SPINE  08-15-2011   _2 , [place]   w/ laminectomy and decompression-- C3 -- T1  . POSTERIOR LAMINECTOMY / DECOMPRESSION LUMBAR SPINE  07-30-2013   _3 , [place]   bilateral T11 - 12,  L2 --5  . 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 SHOULDER ARTHROSCOPY WITH ROTATOR CUFF REPAIR Left 12/15/2018   Procedure: SHOULDER ARTHROSCOPY, biceps tenodesis labored Debridement,  submacromial decompression;  Surgeon: Sydnee Cabal, MD;  Location: [facility];  Service: Orthopedics;  Laterality: Left;  interscalene block  . TOTAL HIP ARTHROPLASTY Left 09/18/2017   _5 , [place]    There were no vitals filed for this visit.  Subjective Assessment - 02/18/19 1016    Subjective  Has been doing well. Reports 70-80% improvement in L shoulder. Still having trouble with his flexibility- scratching his shoulder blade is painful and reaching across his body is difficult.    Pertinent History  Parkinson's Disease, hypothyroidism, hx of multiple concussions, GERD, benign essential HTN, abnormal gait, L THA 2019, removal of T1 paraspinous mass 2017, T11-12 and L2-5 lumbar lami/decompression 2015, cervical fusion C3-T1 2013    Patient Stated Goals  get back to playing the guitar and playing golf    Currently in Pain?  No/denies         [facility] PT Assessment - 02/18/19 0001      Assessment   Medical Diagnosis  Encounter for other orthopedic aftercare; s/p L biceps tenodesis and subacromial decompression    Referring Provider (PT)  Sydnee Cabal, MD    Onset Date/Surgical Date  12/15/18      AROM   Right Shoulder Flexion  140 Degrees    Right  Shoulder ABduction  137 Degrees    Right Shoulder Internal Rotation  --   FIR L1 with mild pain   Right Shoulder External Rotation  --   FER C7 mild pain     PROM   Left Shoulder Flexion  165 Degrees    Left Shoulder ABduction  158 Degrees    Left Shoulder Internal Rotation  74 Degrees    Left Shoulder External Rotation  65 Degrees      Strength   Left Shoulder Flexion  4+/5    Left Shoulder ABduction  4/5    Left Shoulder Internal Rotation  4/5   mild pain in lateral shoulder   Left Shoulder External Rotation  4/5                   [facility] Adult PT Treatment/Exercise - 02/18/19 0001      Elbow Exercises   Other elbow exercises  L biceps isometrics 10x10"   with elbow at side     Shoulder  Exercises: Seated   Flexion  Strengthening;Left;10 reps;Weights    Flexion Weight (lbs)  1    Flexion Limitations  cues to correct forward shoulders    Abduction  Strengthening;Left;10 reps;Weights    ABduction Weight (lbs)  1    ABduction Limitations  good form      Shoulder Exercises: Pulleys   Flexion  3 minutes    Scaption  3 minutes      Shoulder Exercises: Stretch   Other Shoulder Stretches  L shoulder IR/ER stretch with strap to tolerance 10x5"   instructed to avoid jerking and pain     Manual Therapy   Passive ROM  L shoulder PROM with gentle holds/stretch at end ranges   excellent tolerance of PROM            PT Education - 02/18/19 1054    Education Details  discussion on objective progress with PT, update to HEP    Person(s) Educated  Patient    Methods  Explanation;Demonstration;Tactile cues;Verbal cues;Handout    Comprehension  Verbalized understanding;Returned demonstration       PT Short Term Goals - 02/18/19 1021      PT SHORT TERM GOAL #1   Title  Patient to be independent with initial HEP.    Time  3    Period  Weeks    Status  Achieved    Target Date  01/18/19        PT Long Term Goals - 02/18/19 1021      PT LONG TERM GOAL #1   Title  Patient to be independent with advanced HEP.    Time  4    Period  Weeks    Status  Partially Met   met for current   Target Date  03/18/19      PT LONG TERM GOAL #2   Title  Patient to demonstrate L shoulder AROM/PROM [facility] and without pain limiting.    Time  4    Period  Weeks    Status  Partially Met   showing improvements in L shoulder PROM in all planes, AROM flexion and abduction, now able to tolerate FIR/ER   Target Date  03/18/19      PT LONG TERM GOAL #3   Title  Patient to demonstrate L shoulder strength >=4+/5.    Time  4    Period  Weeks    Status  Partially Met   showing improvements in all planes of  strength testing.   Target Date  03/18/19      PT LONG TERM GOAL #4   Title   Patient to demonstrate L shoulder overhead lift with 5lbs and good scapular mechanics without pain limiting.    Time  4    Period  Weeks    Status  Partially Met   tolerated lifting 1# weight overhead without excessive compensation   Target Date  03/18/19      PT LONG TERM GOAL #5   Title  Patient to report tolerance of playing guitar for 10 min without pain limiting.    Time  8    Period  Weeks    Status  Achieved   was able to play for 40 minutes without increase in pain           Plan - 02/18/19 1100    Clinical Impression Statement  Patient arrived to session with report of 70-80% improvement in L shoulder. Would liked to continue working on flexibility and motion, such as reaching across his body and behind his back. Patient is showing improvements in L shoulder PROM in all planes as well as AROM flexion and abduction. Now able to tolerate FIR/ER. Most limitation seems to be in IR/ER at this time. Patient is also showing improvements in all planes of strength testing. Tolerated lifting 1lb weight overhead without excessive compensation and good scapular mechanics. Patient reported that he is now able to tolerate playing the guitar for 40 min without pain, now reaching this goal. Able to perform all stretching and strengthening activities this session with intermittent cues to promote upright posture. Updated HEP with light resistance- patient reported understanding. Patient is demonstrating good progress to wards goals. Would benefit from additional skilled PT services 1x/week or 4 weeks to address remaining goals.    Personal Factors and Comorbidities  Age;Comorbidity 3+;Time since onset of injury/illness/exacerbation;Fitness;Past/Current Experience    Comorbidities  Parkinson's Disease, hypothyroidism, hx of multiple concussions, GERD, benign essential HTN, abnormal gait, L THA 2019, removal of T1 paraspinous mass 2017, T11-12 and L2-5 lumbar lami/decompression 2015, cervical fusion  C3-T1 2013    Rehab Potential  Good    PT Frequency  1x / week    PT Duration  4 weeks    PT Treatment/Interventions  ADLs/Self Care Home Management;Therapeutic exercise;Therapeutic activities;Functional mobility training;DME Instruction;Neuromuscular re-education;Patient/family education;Cryotherapy;Electrical Stimulation;Moist Heat;Ultrasound;Manual techniques;Vasopneumatic Device;Taping;Energy conservation;Dry needling;Passive range of motion;Scar mobilization    PT Next Visit Plan  progress per protocol    Consulted and Agree with Plan of Care  Patient       Patient will benefit from skilled therapeutic intervention in order to improve the following deficits and impairments:  Hypomobility, Increased edema, Decreased scar mobility, Decreased activity tolerance, Decreased strength, Impaired UE functional use, Pain, Decreased range of motion, Improper body mechanics, Postural dysfunction, Impaired flexibility  Visit Diagnosis: Acute pain of left shoulder - Plan: PT plan of care cert/re-cert  Stiffness of left shoulder, not elsewhere classified - Plan: PT plan of care cert/re-cert  Muscle weakness (generalized) - Plan: PT plan of care cert/re-cert  Abnormal posture - Plan: PT plan of care cert/re-cert     Problem List There are no active problems to display for this patient.    Janene Harvey, PT, DPT 02/18/19 11:07 AM   [facility] [address]  [place], [place], [address] Phone: [phone]   Fax:  [phone]  Name: Jim Carson MRN: [id number] Date of Birth: Mar 23, 1951

## 2019-02-23 ENCOUNTER — Other Ambulatory Visit: Payer: Self-pay

## 2019-02-23 ENCOUNTER — Encounter: Payer: Self-pay | Admitting: Physical Therapy

## 2019-02-23 ENCOUNTER — Ambulatory Visit: Payer: Medicare Other | Admitting: Physical Therapy

## 2019-02-23 DIAGNOSIS — M25612 Stiffness of left shoulder, not elsewhere classified: Secondary | ICD-10-CM

## 2019-02-23 DIAGNOSIS — R293 Abnormal posture: Secondary | ICD-10-CM

## 2019-02-23 DIAGNOSIS — M6281 Muscle weakness (generalized): Secondary | ICD-10-CM

## 2019-02-23 DIAGNOSIS — M25512 Pain in left shoulder: Secondary | ICD-10-CM | POA: Diagnosis not present

## 2019-02-23 NOTE — Therapy (Signed)
[facility] [place] [address]  [place], [place], [address] Phone: [phone]   Fax:  [phone]  Physical Therapy Treatment  Patient Details  Name: Jim Carson MRN: [id number] Date of Birth: 1950/10/31 Referring Provider (PT): Sydnee Cabal, MD   Encounter Date: 02/23/2019  PT End of Session - 02/23/19 0933    Visit Number  17    Number of Visits  20    Date for PT Re-Evaluation  03/18/19    Authorization Type  Medicare and [place] Start Time  [phone]    PT Stop Time  0927    PT Time Calculation (min)  41 min    Activity Tolerance  Patient tolerated treatment well    Behavior During Therapy  [facility] for tasks assessed/performed       Past Medical History:  Diagnosis Date  . Abnormal gait    due to parkinson's,  uses cane  . Allergic rhinitis   . Benign essential hypertension   . Chronic constipation   . Deviated septum   . GERD (gastroesophageal reflux disease)   . History of multiple concussions    per pt as teen playing football, no residual  . Hypothyroidism    followed by pcp  . Left shoulder pain   . OA (osteoarthritis)   . OSA on CPAP   . Parkinson disease [facility])    neurologist-- dr b. scott  '@[facility]  Neurology  . Vitamin B12 deficiency   . Vitamin D deficiency   . Wears glasses     Past Surgical History:  Procedure Laterality Date  . APPENDECTOMY  07/1964  . LAPAROSCOPIC CHOLECYSTECTOMY  01-21-2017   '@[facility] , [place]  . POSTERIOR FUSION CERVICAL SPINE  08-15-2011   '@[facility]'$ , [place]   w/ laminectomy and decompression-- C3 -- T1  . POSTERIOR LAMINECTOMY / DECOMPRESSION LUMBAR SPINE  07-30-2013   '@[facility] , [place]   bilateral T11 - 12,  L2 --5  . 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 SHOULDER ARTHROSCOPY WITH ROTATOR CUFF REPAIR Left 12/15/2018   Procedure: SHOULDER ARTHROSCOPY, biceps tenodesis labored Debridement, submacromial decompression;   Surgeon: Sydnee Cabal, MD;  Location: [facility];  Service: Orthopedics;  Laterality: Left;  interscalene block  . TOTAL HIP ARTHROPLASTY Left 09/18/2017   '@[facility]'$ , [place]    There were no vitals filed for this visit.  Subjective Assessment - 02/23/19 0848    Subjective  Reports that his MD released him- advised to continue with PT for remaining visits.    Pertinent History  Parkinson's Disease, hypothyroidism, hx of multiple concussions, GERD, benign essential HTN, abnormal gait, L THA 2019, removal of T1 paraspinous mass 2017, T11-12 and L2-5 lumbar lami/decompression 2015, cervical fusion C3-T1 2013    Patient Stated Goals  get back to playing the guitar and playing golf    Currently in Pain?  Yes    Pain Score  1     Pain Location  Shoulder    Pain Orientation  Left    Pain Descriptors / Indicators  --   stiffness   Pain Type  Acute pain;Surgical pain                       [facility] Adult PT Treatment/Exercise - 02/23/19 0001      Shoulder Exercises: Standing   Protraction  Strengthening;Both;10 reps    Protraction Limitations  2x10; serratus punch against wall    External Rotation  Left;Strengthening;Theraband;10 reps    Theraband Level (Shoulder External Rotation)  Level 1 (Yellow);Level 2 (Red)    External Rotation Limitations  1st set yellow, 2nd set red TB   cues to stop at neutral   Internal Rotation  Left;10 reps;Strengthening;Theraband    Theraband Level (Shoulder Internal Rotation)  Level 1 (Yellow);Level 2 (Red)    Internal Rotation Limitations  1st set yellow, 2nd set red TB   cues to stop at neutral   Extension  Both;10 reps    Theraband Level (Shoulder Extension)  Level 3 (Green)    Extension Weight (lbs)  2x10    Extension Limitations  manual cues for depression of shoulders    Medical sales representative;Theraband;15 reps    Row Limitations  1st set red TB, 2nd set green TB    Other Standing Exercises  B Y lift offs from wall 2x10   cues  for scap retraction     Shoulder Exercises: Pulleys   Flexion  3 minutes    Scaption  3 minutes      Shoulder Exercises: Stretch   Corner Stretch  2 reps;30 seconds    Corner Stretch Limitations  in corner, to tolerance   cues to drop elbows to avoid pain   Other Shoulder Stretches  L shoulder IR/ER stretch with strap to tolerance 10"x5             PT Education - 02/23/19 0932    Education Details  update to HEP; administered green TB    Person(s) Educated  Patient    Methods  Explanation;Demonstration;Tactile cues;Verbal cues;Handout    Comprehension  Verbalized understanding;Returned demonstration       PT Short Term Goals - 02/18/19 1021      PT SHORT TERM GOAL #1   Title  Patient to be independent with initial HEP.    Time  3    Period  Weeks    Status  Achieved    Target Date  01/18/19        PT Long Term Goals - 02/18/19 1021      PT LONG TERM GOAL #1   Title  Patient to be independent with advanced HEP.    Time  4    Period  Weeks    Status  Partially Met   met for current   Target Date  03/18/19      PT LONG TERM GOAL #2   Title  Patient to demonstrate L shoulder AROM/PROM [facility] and without pain limiting.    Time  4    Period  Weeks    Status  Partially Met   showing improvements in L shoulder PROM in all planes, AROM flexion and abduction, now able to tolerate FIR/ER   Target Date  03/18/19      PT LONG TERM GOAL #3   Title  Patient to demonstrate L shoulder strength >=4+/5.    Time  4    Period  Weeks    Status  Partially Met   showing improvements in all planes of strength testing.   Target Date  03/18/19      PT LONG TERM GOAL #4   Title  Patient to demonstrate L shoulder overhead lift with 5lbs and good scapular mechanics without pain limiting.    Time  4    Period  Weeks    Status  Partially Met   tolerated lifting 1# weight overhead without excessive  compensation   Target Date  03/18/19      PT LONG TERM GOAL #5   Title  Patient  to report tolerance of playing guitar for 10 min without pain limiting.    Time  8    Period  Weeks    Status  Achieved   was able to play for 40 minutes without increase in pain           Plan - 02/23/19 0933    Clinical Impression Statement  Patient reporting that he got a good report from MD at last F/U appointment. Agreeable to continue with PT to focus on strengthening. Patient tolerated progressive RTC and periscapular strengthening well. Required intermittent cues to correct tendency for scapular elevation. Required supervision for all standing exercises as patient slightly more off balance today without his cane. Updated HEP with increased banded resistance with rows and IR/ER- patient reported understanding. Ended session without complaints. Patient progressing well per POC.    Personal Factors and Comorbidities  Age;Comorbidity 3+;Time since onset of injury/illness/exacerbation;Fitness;Past/Current Experience    Comorbidities  Parkinson's Disease, hypothyroidism, hx of multiple concussions, GERD, benign essential HTN, abnormal gait, L THA 2019, removal of T1 paraspinous mass 2017, T11-12 and L2-5 lumbar lami/decompression 2015, cervical fusion C3-T1 2013    Rehab Potential  Good    PT Frequency  1x / week    PT Duration  4 weeks    PT Treatment/Interventions  ADLs/Self Care Home Management;Therapeutic exercise;Therapeutic activities;Functional mobility training;DME Instruction;Neuromuscular re-education;Patient/family education;Cryotherapy;Electrical Stimulation;Moist Heat;Ultrasound;Manual techniques;Vasopneumatic Device;Taping;Energy conservation;Dry needling;Passive range of motion;Scar mobilization    PT Next Visit Plan  progress per protocol    Consulted and Agree with Plan of Care  Patient       Patient will benefit from skilled therapeutic intervention in order to improve the following deficits and impairments:  Hypomobility, Increased edema, Decreased scar mobility,  Decreased activity tolerance, Decreased strength, Impaired UE functional use, Pain, Decreased range of motion, Improper body mechanics, Postural dysfunction, Impaired flexibility  Visit Diagnosis: Acute pain of left shoulder  Stiffness of left shoulder, not elsewhere classified  Muscle weakness (generalized)  Abnormal posture     Problem List There are no active problems to display for this patient.    Janene Harvey, PT, DPT 02/23/19 9:37 AM   [facility] [address]  [place], [place], [address] Phone: [phone]   Fax:  [phone]  Name: Andres Vest MRN: [id number] Date of Birth: [birth year]

## 2019-03-02 ENCOUNTER — Encounter: Payer: Self-pay | Admitting: Physical Therapy

## 2019-03-02 ENCOUNTER — Ambulatory Visit: Payer: Medicare Other | Attending: Specialist | Admitting: Physical Therapy

## 2019-03-02 ENCOUNTER — Other Ambulatory Visit: Payer: Self-pay

## 2019-03-02 DIAGNOSIS — M6281 Muscle weakness (generalized): Secondary | ICD-10-CM

## 2019-03-02 DIAGNOSIS — M25512 Pain in left shoulder: Secondary | ICD-10-CM | POA: Diagnosis present

## 2019-03-02 DIAGNOSIS — M25612 Stiffness of left shoulder, not elsewhere classified: Secondary | ICD-10-CM | POA: Insufficient documentation

## 2019-03-02 DIAGNOSIS — R293 Abnormal posture: Secondary | ICD-10-CM | POA: Diagnosis present

## 2019-03-02 NOTE — Therapy (Signed)
[facility] [place] [facility] [address]  [address] [place], [place], [address] Phone: [phone]   Fax:  [phone]  Physical Therapy Treatment  Patient Details  Name: Jim Carson MRN: [id number] Date of Birth: 03/19/1951 Referring Provider (PT): Harvin Collins, MD   Encounter Date: 03/02/2019  PT End of Session - 03/02/19 0928    Visit Number  18    Number of Visits  20    Date for PT Re-Evaluation  03/18/19    Authorization Type  Medicare and [employer] Plan F    PT Start Time  0845    PT Stop Time  0927    PT Time Calculation (min)  42 min    Activity Tolerance  Patient tolerated treatment well    Behavior During Therapy  WFL for tasks assessed/performed       Past Medical History:  Diagnosis Date  . Abnormal gait    due to parkinson's,  uses cane  . Allergic rhinitis   . Benign essential hypertension   . Chronic constipation   . Deviated septum   . GERD (gastroesophageal reflux disease)   . History of multiple concussions    per pt as teen playing football, no residual  . Hypothyroidism    followed by pcp  . Left shoulder pain   . OA (osteoarthritis)   . OSA on CPAP   . Parkinson disease ([facility])    neurologist-- dr b. scott  @[facility]  . Vitamin B12 deficiency   . Vitamin D deficiency   . Wears glasses     Past Surgical History:  Procedure Laterality Date  . APPENDECTOMY  07/1964  . LAPAROSCOPIC CHOLECYSTECTOMY  01-21-2017   @[facility], [place]  . POSTERIOR FUSION CERVICAL SPINE  08-15-2011   @[facility], [place]   w/ laminectomy and decompression-- C3 -- T1  . POSTERIOR LAMINECTOMY / DECOMPRESSION LUMBAR SPINE  07-30-2013   @[facility], [place]   bilateral T11 - 12,  L2 --5  . REMOVAL LEFT T1 PARASPINOUS MASS  02-12-2016   @[facility],  [place]   desmoid fibromatosis  . SHOULDER ARTHROSCOPY WITH ROTATOR CUFF REPAIR Left 12/15/2018   Procedure: SHOULDER ARTHROSCOPY, biceps tenodesis labored Debridement, submacromial decompression;   Surgeon: Collins, Ahyan, MD;  Location: [facility];  Service: Orthopedics;  Laterality: Left;  interscalene block  . TOTAL HIP ARTHROPLASTY Left 09/18/2017   @[facility], [place]    There were no vitals filed for this visit.  Subjective Assessment - 03/02/19 0847    Subjective  Has been performing HEP and icing.    Pertinent History  Parkinson's Disease, hypothyroidism, hx of multiple concussions, GERD, benign essential HTN, abnormal gait, L THA 2019, removal of T1 paraspinous mass 2017, T11-12 and L2-5 lumbar lami/decompression 2015, cervical fusion C3-T1 2013    Patient Stated Goals  get back to playing the guitar and playing golf    Currently in Pain?  Yes    Pain Score  1     Pain Location  Shoulder    Pain Orientation  Left    Pain Descriptors / Indicators  Dull    Pain Type  Acute pain;Surgical pain                       [facility] Adult PT Treatment/Exercise - 03/02/19 0001      Shoulder Exercises: Seated   Flexion  Strengthening;Left;10 reps;Theraband    Theraband Level (Shoulder Flexion)  Level 1 (Yellow)    Flexion Limitations    thumb up   cues to avoid L shoulder hiking   Abduction  Strengthening;Left;10 reps;Theraband    Theraband Level (Shoulder ABduction)  Level 1 (Yellow)    ABduction Limitations  scaption with thumb up   manual cues to maintain scapular depression   Diagonals  Strengthening;Left;10 reps;Theraband    Theraband Level (Shoulder Diagonals)  Level 1 (Yellow)    Diagonals Limitations  cues for proper movement pattern      Shoulder Exercises: Prone   Flexion  Strengthening;Left;10 reps    Flexion Limitations  prone I over green pball   cues for proper orientation   Horizontal ABduction 1  Strengthening;Left;10 reps    Horizontal ABduction 1 Limitations  prone T over green pball   cues for alignment   Other Prone Exercises  prone Y over green pball x10      Shoulder Exercises: ROM/Strengthening   UBE (Upper Arm Bike)  L1 3min  forward/3 min back   cues to use R UE more than L   Lat Pull Limitations  10x wide grip 15#    Cybex Row Limitations  narrow grip 1st set 10x 15#, 2nd set 10x 20#   cues for scapular retraction     Shoulder Exercises: Stretch   Corner Stretch  2 reps;30 seconds    Corner Stretch Limitations  in corner, to tolerance   heavy cues for form            PT Education - 03/02/19 0928    Education Details  update to HEP; administered yellow TB    Person(s) Educated  Patient    Methods  Explanation;Demonstration;Tactile cues;Verbal cues;Handout    Comprehension  Verbalized understanding;Returned demonstration       PT Short Term Goals - 02/18/19 1021      PT SHORT TERM GOAL #1   Title  Patient to be independent with initial HEP.    Time  3    Period  Weeks    Status  Achieved    Target Date  01/18/19        PT Long Term Goals - 02/18/19 1021      PT LONG TERM GOAL #1   Title  Patient to be independent with advanced HEP.    Time  4    Period  Weeks    Status  Partially Met   met for current   Target Date  03/18/19      PT LONG TERM GOAL #2   Title  Patient to demonstrate L shoulder AROM/PROM WFL and without pain limiting.    Time  4    Period  Weeks    Status  Partially Met   showing improvements in L shoulder PROM in all planes, AROM flexion and abduction, now able to tolerate FIR/ER   Target Date  03/18/19      PT LONG TERM GOAL #3   Title  Patient to demonstrate L shoulder strength >=4+/5.    Time  4    Period  Weeks    Status  Partially Met   showing improvements in all planes of strength testing.   Target Date  03/18/19      PT LONG TERM GOAL #4   Title  Patient to demonstrate L shoulder overhead lift with 5lbs and good scapular mechanics without pain limiting.    Time  4    Period  Weeks    Status  Partially Met   tolerated lifting 1# weight overhead without excessive compensation     Target Date  03/18/19      PT LONG TERM GOAL #5   Title  Patient  to report tolerance of playing guitar for 10 min without pain limiting.    Time  8    Period  Weeks    Status  Achieved   was able to play for 40 minutes without increase in pain           Plan - 03/02/19 0928    Clinical Impression Statement  Patient without complaints at beginning of session. Worked on progressive periscapular strengthening in prone over physioball- patient with overall good form, with intermittent cues to correct alignment. Able to perform resisted flexion and scaption with light banded resistance with manual cues to promote scapular depression and upward rotation. Patient tolerated these activities well, thus updated HEP with this exercises. Trialed machine strengthening with light weights and with cues to correct posture and periscapular activation. Patient tolerated all activities well and is showing good progress. Plan for D/C next session.    Personal Factors and Comorbidities  Age;Comorbidity 3+;Time since onset of injury/illness/exacerbation;Fitness;Past/Current Experience    Comorbidities  Parkinson's Disease, hypothyroidism, hx of multiple concussions, GERD, benign essential HTN, abnormal gait, L THA 2019, removal of T1 paraspinous mass 2017, T11-12 and L2-5 lumbar lami/decompression 2015, cervical fusion C3-T1 2013    Rehab Potential  Good    PT Frequency  1x / week    PT Duration  4 weeks    PT Treatment/Interventions  ADLs/Self Care Home Management;Therapeutic exercise;Therapeutic activities;Functional mobility training;DME Instruction;Neuromuscular re-education;Patient/family education;Cryotherapy;Electrical Stimulation;Moist Heat;Ultrasound;Manual techniques;Vasopneumatic Device;Taping;Energy conservation;Dry needling;Passive range of motion;Scar mobilization    PT Next Visit Plan  progress per protocol    Consulted and Agree with Plan of Care  Patient       Patient will benefit from skilled therapeutic intervention in order to improve the following  deficits and impairments:  Hypomobility, Increased edema, Decreased scar mobility, Decreased activity tolerance, Decreased strength, Impaired UE functional use, Pain, Decreased range of motion, Improper body mechanics, Postural dysfunction, Impaired flexibility  Visit Diagnosis: Acute pain of left shoulder  Stiffness of left shoulder, not elsewhere classified  Muscle weakness (generalized)  Abnormal posture     Problem List There are no active problems to display for this patient.    Janene Harvey, PT, DPT 03/02/19 10:32 AM   [facility] [address]  [place], [place], [address] Phone: [phone]   Fax:  [phone]  Name: Jim Carson MRN: [id number] Date of Birth: [birth year]

## 2019-03-09 ENCOUNTER — Ambulatory Visit: Payer: Medicare Other | Admitting: Physical Therapy

## 2019-03-09 ENCOUNTER — Other Ambulatory Visit: Payer: Self-pay

## 2019-03-09 ENCOUNTER — Encounter: Payer: Self-pay | Admitting: Physical Therapy

## 2019-03-09 DIAGNOSIS — M25512 Pain in left shoulder: Secondary | ICD-10-CM | POA: Diagnosis not present

## 2019-03-09 DIAGNOSIS — M6281 Muscle weakness (generalized): Secondary | ICD-10-CM

## 2019-03-09 DIAGNOSIS — M25612 Stiffness of left shoulder, not elsewhere classified: Secondary | ICD-10-CM

## 2019-03-09 DIAGNOSIS — R293 Abnormal posture: Secondary | ICD-10-CM

## 2019-03-09 NOTE — Therapy (Signed)
[facility] [address]  [place], [place], [address] Phone: [phone]   Fax:  [phone]  Physical Therapy Discharge Summary  Patient Details  Name: Jim Carson MRN: [id number] Date of Birth: 1950-09-10 Referring Provider (PT): Sydnee Cabal, MD     Progress Note Reporting Period 02/01/19 to 03/09/19  See note below for Objective Data and Assessment of Progress/Goals.       Encounter Date: 03/09/2019  PT End of Session - 03/09/19 1229    Visit Number  19    Number of Visits  20    Date for PT Re-Evaluation  03/18/19    Authorization Type  Medicare and [place] [facility]    PT Start Time  [phone]    PT Stop Time  0929    PT Time Calculation (min)  45 min    Activity Tolerance  Patient tolerated treatment well    Behavior During Therapy  [facility] for tasks assessed/performed       Past Medical History:  Diagnosis Date  . Abnormal gait    due to parkinson's,  uses cane  . Allergic rhinitis   . Benign essential hypertension   . Chronic constipation   . Deviated septum   . GERD (gastroesophageal reflux disease)   . History of multiple concussions    per pt as teen playing football, no residual  . Hypothyroidism    followed by pcp  . Left shoulder pain   . OA (osteoarthritis)   . OSA on CPAP   . Parkinson disease [facility])    neurologist-- dr b. scott  _0  Neurology  . Vitamin B12 deficiency   . Vitamin D deficiency   . Wears glasses     Past Surgical History:  Procedure Laterality Date  . APPENDECTOMY  07/1964  . LAPAROSCOPIC CHOLECYSTECTOMY  01-21-2017   _1 , [place]  . POSTERIOR FUSION CERVICAL SPINE  08-15-2011   _2 , [place]   w/ laminectomy and decompression-- C3 -- T1  . POSTERIOR LAMINECTOMY / DECOMPRESSION LUMBAR SPINE  07-30-2013   _3 , [place]   bilateral T11 - 12,  L2 --5  . 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 SHOULDER ARTHROSCOPY  WITH ROTATOR CUFF REPAIR Left 12/15/2018   Procedure: SHOULDER ARTHROSCOPY, biceps tenodesis labored Debridement, submacromial decompression;  Surgeon: Sydnee Cabal, MD;  Location: [facility];  Service: Orthopedics;  Laterality: Left;  interscalene block  . TOTAL HIP ARTHROPLASTY Left 09/18/2017   _5 , [place]    There were no vitals filed for this visit.  Subjective Assessment - 03/09/19 0844    Subjective  His shoulder has felt pretty good. Reports 85-90% improvement in shoulder. Still needs to work on strength and reaching behind back. Feels ready to wrap up with PT.    Pertinent History  Parkinson's Disease, hypothyroidism, hx of multiple concussions, GERD, benign essential HTN, abnormal gait, L THA 2019, removal of T1 paraspinous mass 2017, T11-12 and L2-5 lumbar lami/decompression 2015, cervical fusion C3-T1 2013    Patient Stated Goals  get back to playing the guitar and playing golf    Currently in Pain?  No/denies         [facility] PT Assessment - 03/09/19 0001      Observation/Other Assessments   Focus on Therapeutic Outcomes (FOTO)   Shoulder: 83 (17% limited, 29% predicted)      AROM   Left Shoulder Flexion  153 Degrees  Left Shoulder ABduction  153 Degrees    Left Shoulder Internal Rotation  --   FIR L1   Left Shoulder External Rotation  --   FER T1     PROM   Left Shoulder Flexion  165 Degrees    Left Shoulder ABduction  164 Degrees    Left Shoulder Internal Rotation  80 Degrees    Left Shoulder External Rotation  76 Degrees      Strength   Left Shoulder Flexion  4+/5    Left Shoulder ABduction  4+/5    Left Shoulder Internal Rotation  4+/5    Left Shoulder External Rotation  4/5   very mild pain                  [facility] Adult PT Treatment/Exercise - 03/09/19 0001      Elbow Exercises   Other elbow exercises  L biceps isometrics 10x10"   50% effort     Shoulder Exercises: Standing   Other Standing Exercises  B ER with yellow  TB 2x10    Other Standing Exercises  reaching to overhead shelf with L UE with weights 1-5 lbs with good scapulohumeral rhythm x5      Shoulder Exercises: ROM/Strengthening   UBE (Upper Arm Bike)  L1 46mn forward/3 min back      Shoulder Exercises: Stretch   Other Shoulder Stretches  L shoulder IR/ER stretch with strap to tolerance 10"x5      Manual Therapy   Manual Therapy  Passive ROM    Passive ROM  L shoulder PROM with gentle holds/stretch at end ranges             PT Education - 03/09/19 0930    Education Details  update/consolidation of HEP, discussion on objective measurements    Person(s) Educated  Patient    Methods  Explanation;Demonstration;Tactile cues;Verbal cues;Handout    Comprehension  Verbalized understanding;Returned demonstration       PT Short Term Goals - 03/09/19 0854      PT SHORT TERM GOAL #1   Title  Patient to be independent with initial HEP.    Time  3    Period  Weeks    Status  Achieved    Target Date  01/18/19        PT Long Term Goals - 03/09/19 0854      PT LONG TERM GOAL #1   Title  Patient to be independent with advanced HEP.    Time  4    Period  Weeks    Status  Achieved      PT LONG TERM GOAL #2   Title  Patient to demonstrate L shoulder AROM/PROM WFL and without pain limiting.    Time  4    Period  Weeks    Status  Achieved   showing improvements in L shoulder PROM in abduction, IR, ER and AROM in flexion, abduction, and ER     PT LONG TERM GOAL #3   Title  Patient to demonstrate L shoulder strength >=4+/5.    Time  4    Period  Weeks    Status  Partially Met   showing improvements in abduction and IR     PT LONG TERM GOAL #4   Title  Patient to demonstrate L shoulder overhead lift with 5lbs and good scapular mechanics without pain limiting.    Time  4    Period  Weeks    Status  Achieved  PT LONG TERM GOAL #5   Title  Patient to report tolerance of playing guitar for 10 min without pain limiting.    Time   8    Period  Weeks    Status  Achieved   was able to play for 40 minutes without increase in pain           Plan - 03/09/19 1234    Clinical Impression Statement  Patient reporting 85-90% improvement in L shoulder since initial eval. Notes that he would like to continue working on strength, reaching behind his back, and returning to golf. Patient has shown improvements in L shoulder PROM in abduction, IR, ER and AROM in flexion, abduction, and ER. ROM is now nearly to opposite UE. Strength testing revealed improvements in abduction and IR. Patient was able to lift 5lbs to overhead shelf with good scapulohumeral rhythm and no complaints today. Has also returned to playing the guitar. Educated patient on consolidated HEP to continue his progress. Patient reported understanding and with no complaints at end of session. Patient has demonstrated good progress and is ready to transition to HEP program. Discharged at this time.    Personal Factors and Comorbidities  Age;Comorbidity 3+;Time since onset of injury/illness/exacerbation;Fitness;Past/Current Experience    Comorbidities  Parkinson's Disease, hypothyroidism, hx of multiple concussions, GERD, benign essential HTN, abnormal gait, L THA 2019, removal of T1 paraspinous mass 2017, T11-12 and L2-5 lumbar lami/decompression 2015, cervical fusion C3-T1 2013    Rehab Potential  Good    PT Frequency  1x / week    PT Duration  4 weeks    PT Treatment/Interventions  ADLs/Self Care Home Management;Therapeutic exercise;Therapeutic activities;Functional mobility training;DME Instruction;Neuromuscular re-education;Patient/family education;Cryotherapy;Electrical Stimulation;Moist Heat;Ultrasound;Manual techniques;Vasopneumatic Device;Taping;Energy conservation;Dry needling;Passive range of motion;Scar mobilization    PT Next Visit Plan  DC at this time    Consulted and Agree with Plan of Care  Patient       Patient will benefit from skilled therapeutic  intervention in order to improve the following deficits and impairments:  Hypomobility, Increased edema, Decreased scar mobility, Decreased activity tolerance, Decreased strength, Impaired UE functional use, Pain, Decreased range of motion, Improper body mechanics, Postural dysfunction, Impaired flexibility  Visit Diagnosis: Acute pain of left shoulder  Stiffness of left shoulder, not elsewhere classified  Muscle weakness (generalized)  Abnormal posture     Problem List There are no active problems to display for this patient.    PHYSICAL THERAPY DISCHARGE SUMMARY  Visits from Start of Care: 19  Current functional level related to goals / functional outcomes: See above clinical impression   Remaining deficits: Decreased strength   Education / Equipment: HEP  Plan: Patient agrees to discharge.  Patient goals were partially met. Patient is being discharged due to meeting the stated rehab goals.  ?????     Janene Harvey, PT, DPT 03/09/19 12:38 PM   [facility] [address]  [place], [place], [address] Phone: [phone]   Fax:  [phone]  Name: Jim Carson MRN: [id number] Date of Birth: [birth year]

## 2019-03-16 ENCOUNTER — Encounter: Payer: Medicare Other | Admitting: Physical Therapy

## 2019-07-15 ENCOUNTER — Ambulatory Visit: Payer: Medicare Other | Attending: Physician Assistant | Admitting: Physical Therapy

## 2019-07-15 ENCOUNTER — Telehealth: Payer: Self-pay | Admitting: Physical Therapy

## 2019-07-15 ENCOUNTER — Ambulatory Visit: Payer: Medicare Other

## 2019-07-15 ENCOUNTER — Other Ambulatory Visit: Payer: Self-pay

## 2019-07-15 ENCOUNTER — Ambulatory Visit: Payer: Medicare Other | Admitting: Occupational Therapy

## 2019-07-15 DIAGNOSIS — R278 Other lack of coordination: Secondary | ICD-10-CM | POA: Insufficient documentation

## 2019-07-15 DIAGNOSIS — R2689 Other abnormalities of gait and mobility: Secondary | ICD-10-CM | POA: Insufficient documentation

## 2019-07-15 DIAGNOSIS — R471 Dysarthria and anarthria: Secondary | ICD-10-CM | POA: Insufficient documentation

## 2019-07-15 NOTE — Therapy (Signed)
[facility] [address] [place], [place], [address] Phone: [phone]   Fax:  [phone]  Patient Details  Name: Jim Carson MRN: [id number] Date of Birth: September 27, 1950 Referring Provider:  Modena Nunnery, PA  Encounter Date: 07/15/2019 Occupational Therapy Parkinson's Disease Screen  Hand dominance:  LUE  Physical Performance Test item #2 (simulated eating):  12.65 sec  Fastening/unfastening 3 buttons in:  50.66 sec  9-hole peg test:    RUE  43.82 sec        LUE  45.38 sec multiple drops       Change in ability to perform ADLs/IADLs:  Handwriting good legibility and letter size.  Other Comments:   Pt reports increased difficulty with fine motor coordination for playing guitar.  Pt would benefit from occupational therapy evaluation due to  Decreased fine motor coordination, increased time required with ADLS.   [name] 07/15/2019, 8:04 AM  [facility] [address] [place], [place], [address] Phone: [phone]   Fax:  [phone]

## 2019-07-15 NOTE — Therapy (Signed)
[facility] [address] [place], [place], [address] Phone: [phone]   Fax:  [phone]  Patient Details  Name: Jim Carson MRN: [id number] Date of Birth: July 22, 1950 Referring Provider:  Modena Nunnery, PA  Encounter Date: 07/15/2019  Physical Therapy Parkinson's Disease Screen   Timed Up and Go test: 13.41 sec  10 meter walk test: 11.19 sec = 2.93 ft/sec  5 time sit to stand test: 10.75 sec  Pt reports feeling "like balance is detiorating, not as stable as I used to be."  Patient would benefit from Physical Therapy evaluation due to pt's subjective reports of slowing mobility and balance changes.       Mary-Ann Pennella W. 07/15/2019, 8:34 AM Mady Haagensen, PT 07/15/19 8:42 AM Phone: [phone] Fax: [facility] [address] [place], [place], [address] Phone: [phone]   Fax:  [phone]

## 2019-07-15 NOTE — Telephone Encounter (Signed)
Jim Carson was seen for multi-disciplinary PD screens in our clinic today.  The following recommendations were made:  Speech therapy recommends speech therapy evaluation due to decreased voice volume in conversation.  Occupational therapy recommends OT evaluation due to decreased coordination in upper extremities.  Physical therapy recommends PT evaluation due to pt's reported slowed mobility and balance changes.  If you agree, could you please send order via Epic for OT, speech, and PT eval and treat?  Thank you.  Mady Haagensen, PT 07/15/19 11:10 AM Phone: [phone] Fax: [phone]

## 2019-07-15 NOTE — Therapy (Addendum)
[facility] [address] [place], [place], [address] Phone: [phone]   Fax:  [phone]  Patient Details  Name: Jim Carson MRN: [id number] Date of Birth: 22-Nov-1950 Referring Provider:  Modena Nunnery, [place]  Encounter Date: 07/15/2019  Speech Therapy Parkinson's Disease Screen   Decibel Level today: mid 60s dB  (WNL=70-72 dB), masked, initially, with sound level meter 30cm away from pt's mouth. When SLP asked about previous Parkinson's therapy pt increased his volume when telling SLP he had previous therapy. Pt's conversational volume began lower than WNL, but SLP feels pt incr'd volume when thinking of his need to do so.  Pt does not report difficulty in swallowing warranting further evaluation.  Pt would benefit from speech-language eval for dysarthria. Please order via EPIC or call [phone] to schedule   [facility] ,[place], [place]  07/15/2019, 11:11 AM  [facility] [address] [place], [place], [address] Phone: [phone]   Fax:  [phone]

## 2019-07-29 ENCOUNTER — Emergency Department (HOSPITAL_BASED_OUTPATIENT_CLINIC_OR_DEPARTMENT_OTHER)
Admission: EM | Admit: 2019-07-29 | Discharge: 2019-07-29 | Disposition: A | Discharge status: Home / Self Care | Payer: Medicare Other | Attending: Emergency Medicine | Admitting: Emergency Medicine

## 2019-07-29 ENCOUNTER — Other Ambulatory Visit: Payer: Self-pay

## 2019-07-29 ENCOUNTER — Encounter (HOSPITAL_BASED_OUTPATIENT_CLINIC_OR_DEPARTMENT_OTHER): Payer: Self-pay | Admitting: Emergency Medicine

## 2019-07-29 DIAGNOSIS — Z79899 Other long term (current) drug therapy: Secondary | ICD-10-CM | POA: Insufficient documentation

## 2019-07-29 DIAGNOSIS — S01111A Laceration without foreign body of right eyelid and periocular area, initial encounter: Secondary | ICD-10-CM | POA: Diagnosis not present

## 2019-07-29 DIAGNOSIS — Y999 Unspecified external cause status: Secondary | ICD-10-CM | POA: Insufficient documentation

## 2019-07-29 DIAGNOSIS — Y9289 Other specified places as the place of occurrence of the external cause: Secondary | ICD-10-CM | POA: Insufficient documentation

## 2019-07-29 DIAGNOSIS — E039 Hypothyroidism, unspecified: Secondary | ICD-10-CM | POA: Insufficient documentation

## 2019-07-29 DIAGNOSIS — W010XXA Fall on same level from slipping, tripping and stumbling without subsequent striking against object, initial encounter: Secondary | ICD-10-CM | POA: Diagnosis not present

## 2019-07-29 DIAGNOSIS — Z7982 Long term (current) use of aspirin: Secondary | ICD-10-CM | POA: Insufficient documentation

## 2019-07-29 DIAGNOSIS — Y939 Activity, unspecified: Secondary | ICD-10-CM | POA: Diagnosis not present

## 2019-07-29 DIAGNOSIS — W19XXXA Unspecified fall, initial encounter: Secondary | ICD-10-CM

## 2019-07-29 DIAGNOSIS — S0990XA Unspecified injury of head, initial encounter: Secondary | ICD-10-CM | POA: Diagnosis present

## 2019-07-29 MED ORDER — LIDOCAINE HCL (PF) 1 % IJ SOLN
2.0000 mL | Freq: Once | INTRAMUSCULAR | Status: AC
  Administered 2019-07-29: 2 mL
  Filled 2019-07-29: qty 5

## 2019-07-29 NOTE — ED Provider Notes (Signed)
[facility] EMERGENCY DEPARTMENT Provider Note   CSN: [id number] Arrival date & time: 07/29/19  1525     History Chief Complaint  Patient presents with  . Fall    Jim Carson is a 69 y.o. male.  Patient stumble over a curb while walking in to a COVID vaccination clinic. He fell face first. He was able to break his fall, but ended up striking his forehead. He has a small laceration to the right eyebrow, and several abrasions to the fingers of his right hand. No loss of consciousness. Is not on a blood thinner.  The history is provided by the patient. No language interpreter was used.  Fall This is a new problem. The current episode started 1 to 2 hours ago.       Past Medical History:  Diagnosis Date  . Abnormal gait    due to parkinson's,  uses cane  . Allergic rhinitis   . Benign essential hypertension   . Chronic constipation   . Deviated septum   . GERD (gastroesophageal reflux disease)   . History of multiple concussions    per pt as teen playing football, no residual  . Hypothyroidism    followed by pcp  . Left shoulder pain   . OA (osteoarthritis)   . OSA on CPAP   . Parkinson disease [facility])    neurologist-- dr b. scott  @[facility]  Neurology  . Vitamin B12 deficiency   . Vitamin D deficiency   . Wears glasses     There are no problems to display for this patient.   Past Surgical History:  Procedure Laterality Date  . APPENDECTOMY  07/1964  . LAPAROSCOPIC CHOLECYSTECTOMY  01-21-2017   @[facility] , [place]  . POSTERIOR FUSION CERVICAL SPINE  08-15-2011   @[facility] , [place]   w/ laminectomy and decompression-- C3 -- T1  . POSTERIOR LAMINECTOMY / DECOMPRESSION LUMBAR SPINE  07-30-2013   @[facility] , [place]   bilateral T11 - 12,  L2 --5  . 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 SHOULDER ARTHROSCOPY WITH ROTATOR CUFF REPAIR Left 12/15/2018   Procedure: SHOULDER ARTHROSCOPY, biceps tenodesis labored Debridement,  submacromial decompression;  Surgeon: Sydnee Cabal, MD;  Location: [facility];  Service: Orthopedics;  Laterality: Left;  interscalene block  . TOTAL HIP ARTHROPLASTY Left 09/18/2017   @[facility] , [place]       No family history on file.  Social History   Tobacco Use  . Smoking status: Never Smoker  . Smokeless tobacco: Never Used  Substance Use Topics  . Alcohol use: Yes    Comment: one daily wine  . Drug use: Never    Home Medications Prior to Admission medications   Medication Sig Start Date End Date Taking? Authorizing Provider  aspirin EC 81 MG tablet Take 81 mg by mouth at bedtime.    [provider]  carbidopa-levodopa (SINEMET IR) 25-100 MG tablet Take 2 tablets by mouth 3 (three) times daily.     [provider]  Carbidopa-Levodopa ER (SINEMET CR) 25-100 MG tablet controlled release Take 1 tablet by mouth at bedtime.     [provider]  Cholecalciferol (VITAMIN D3) 125 MCG (5000 UT) CAPS Take 1 capsule by mouth daily.    [provider]  diclofenac sodium (VOLTAREN) 1 % GEL Apply topically 4 (four) times daily.    [provider]  docusate sodium (COLACE) 100 MG capsule Take 100 mg by mouth daily.  [provider]  Glucosamine-Chondroitin-Ca-D3 (TRIPLE FLEX 50+ PO) Take by mouth 2 (two) times daily.    [provider]  ipratropium (ATROVENT) 0.06 % nasal spray Place 2 sprays into both nostrils daily as needed for rhinitis.    [provider]  levothyroxine (SYNTHROID, LEVOTHROID) 75 MCG tablet Take 75 mcg by mouth daily before breakfast.    [provider]  losartan-hydrochlorothiazide (HYZAAR) 100-25 MG tablet Take 1 tablet by mouth daily.     [provider]  omeprazole (PRILOSEC) 20 MG capsule Take 20 mg by mouth daily.     [provider]  pramipexole (MIRAPEX) 1 MG tablet Take 1 mg by mouth 2 (two) times daily.    [provider]  selegiline  (ELDEPRYL) 5 MG tablet Take 5 mg by mouth 2 (two) times daily with a meal.    [provider]  vitamin B-12 (CYANOCOBALAMIN) 1000 MCG tablet Take 1,000 mcg by mouth daily.    [provider]    Allergies    Patient has no known allergies.  Review of Systems   Review of Systems  Skin: Positive for wound.  All other systems reviewed and are negative.   Physical Exam Updated Vital Signs BP (!) 159/63 (BP Location: Right Arm)   Pulse 74   Temp 97.6 F (36.4 C) (Oral)   Resp 18   Ht 5\' 8"  (1.727 m)   Wt 100.7 kg   SpO2 100%   BMI 33.75 kg/m   Physical Exam Vitals and nursing note reviewed.  Constitutional:      Appearance: Normal appearance.  Eyes:     Extraocular Movements: Extraocular movements intact.     Conjunctiva/sclera: Conjunctivae normal.  Cardiovascular:     Rate and Rhythm: Normal rate and regular rhythm.  Pulmonary:     Effort: Pulmonary effort is normal.     Breath sounds: Normal breath sounds.  Musculoskeletal:     Cervical back: Neck supple. No tenderness.  Skin:    General: Skin is warm and dry.  Neurological:     Mental Status: He is alert and oriented to person, place, and time.  Psychiatric:        Mood and Affect: Mood normal.     ED Results / Procedures / Treatments   Labs (all labs ordered are listed, but only abnormal results are displayed) Labs Reviewed - No data to display  EKG None  Radiology No results found.  Procedures .Marland KitchenLaceration Repair  Date/Time: 07/29/2019 5:06 PM Performed by: Etta Quill, NP Authorized by: Etta Quill, NP   Consent:    Consent obtained:  Verbal   Consent given by:  Patient   Risks discussed:  Infection and poor cosmetic result   Alternatives discussed:  No treatment Anesthesia (see MAR for exact dosages):    Anesthesia method:  Local infiltration   Local anesthetic:  Lidocaine 1% w/o epi Laceration details:    Location:  Face   Face location:  R eyebrow   Length (cm):   1.2 Repair type:    Repair type:  Simple Pre-procedure details:    Preparation:  Patient was prepped and draped in usual sterile fashion Exploration:    Hemostasis achieved with:  Direct pressure   Wound exploration: entire depth of wound probed and visualized     Contaminated: no   Treatment:    Area cleansed with:  Betadine and saline   Amount of cleaning:  Standard   Irrigation solution:  Sterile saline   Irrigation  method:  Syringe Skin repair:    Repair method:  Sutures   Suture size:  5-0   Suture material:  Prolene   Number of sutures:  5 Post-procedure details:    Patient tolerance of procedure:  Tolerated well, no immediate complications   (including critical care time)  Medications Ordered in ED Medications  lidocaine (PF) (XYLOCAINE) 1 % injection 2 mL (2 mLs Infiltration Given 07/29/19 1556)    ED Course  I have reviewed the triage vital signs and the nursing notes.  Pertinent labs & imaging results that were available during my care of the patient were reviewed by me and considered in my medical decision making (see chart for details).    MDM Rules/Calculators/A&P                      Patient discussed with Dr. Stark Jock.  Tetanus UTD. Laceration occurred < 12 hours prior to repair. Discussed laceration care with pt and answered questions. Pt to f-u for suture removal in 7 days and wound check sooner should there be signs of dehiscence or infection. Pt is hemodynamically stable with no complaints prior to dc.   Final Clinical Impression(s) / ED Diagnoses Final diagnoses:  Laceration of right eyebrow, initial encounter  Fall, initial encounter    Rx / DC Orders ED Discharge Orders    None       Etta Quill, NP 07/29/19 1733    Veryl Speak, MD 07/29/19 2249

## 2019-07-29 NOTE — Discharge Instructions (Addendum)
Suture removal in 7-10 days. You may follow-up with your primary care provider, an urgent care, or return here.

## 2019-07-29 NOTE — ED Triage Notes (Signed)
Tripped and fell over a curb landing face first. Abrasions noted to R knuckles, lac to R eyebrow, R rib pain. Denies LOC, no blood thinners.

## 2019-08-23 ENCOUNTER — Ambulatory Visit: Payer: Medicare Other | Attending: Physician Assistant | Admitting: Physical Therapy

## 2019-08-23 ENCOUNTER — Other Ambulatory Visit: Payer: Self-pay

## 2019-08-23 DIAGNOSIS — R293 Abnormal posture: Secondary | ICD-10-CM | POA: Insufficient documentation

## 2019-08-23 DIAGNOSIS — R2689 Other abnormalities of gait and mobility: Secondary | ICD-10-CM | POA: Diagnosis not present

## 2019-08-23 DIAGNOSIS — M6281 Muscle weakness (generalized): Secondary | ICD-10-CM | POA: Diagnosis present

## 2019-08-23 DIAGNOSIS — R2681 Unsteadiness on feet: Secondary | ICD-10-CM | POA: Diagnosis present

## 2019-08-23 NOTE — Therapy (Signed)
[facility] [address] [place], [place], [address] Phone: [phone]   Fax:  [phone]  Physical Therapy Evaluation  Patient Details  Name: Jim Carson MRN: [id number] Date of Birth: 22-Jul-1950 Referring Provider (PT): Gillermo Murdoch   Encounter Date: 08/23/2019  PT End of Session - 08/23/19 0853    Visit Number  1    Number of Visits  13    Date for PT Re-Evaluation  11/21/19    Authorization Type  Medicare/[employer]-will need 10th visit progress note    PT Start Time  0803    PT Stop Time  0844    PT Time Calculation (min)  41 min    Activity Tolerance  Patient tolerated treatment well    Behavior During Therapy  [facility] for tasks assessed/performed       Past Medical History:  Diagnosis Date  . Abnormal gait    due to parkinson's,  uses cane  . Allergic rhinitis   . Benign essential hypertension   . Chronic constipation   . Deviated septum   . GERD (gastroesophageal reflux disease)   . History of multiple concussions    per pt as teen playing football, no residual  . Hypothyroidism    followed by pcp  . Left shoulder pain   . OA (osteoarthritis)   . OSA on CPAP   . Parkinson disease [facility])    neurologist-- dr b. scott  @[facility]  . Vitamin B12 deficiency   . Vitamin D deficiency   . Wears glasses     Past Surgical History:  Procedure Laterality Date  . APPENDECTOMY  07/1964  . LAPAROSCOPIC CHOLECYSTECTOMY  01-21-2017   @[facility] , [place]  . POSTERIOR FUSION CERVICAL SPINE  08-15-2011   @[facility] , [place]   w/ laminectomy and decompression-- C3 -- T1  . POSTERIOR LAMINECTOMY / DECOMPRESSION LUMBAR SPINE  07-30-2013   @[facility] , [place]   bilateral T11 - 12,  L2 --5  . 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 SHOULDER ARTHROSCOPY WITH ROTATOR CUFF REPAIR Left 12/15/2018   Procedure: SHOULDER ARTHROSCOPY, biceps tenodesis labored Debridement, submacromial  decompression;  Surgeon: Sydnee Cabal, MD;  Location: [facility];  Service: Orthopedics;  Laterality: Left;  interscalene block  . 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   Subjective Assessment - 08/23/19 0806    Subjective  Feel like my balance is not like it was.  I don't think I'm working out as much as I was.  I'd like to start back to [facility] (was going until December due to [place]).  Had a fall almost a month ago, foot catching the curb.  Use the cane when out of the house; no cane in the house.  Pt reports LLE is weaker due to Parkisnson's and RLE is weaker due to hx of back surgery.    Patient Stated Goals  Pt's goals for therapy are to improve my balance.    Currently in Pain?  No/denies         [facility] PT Assessment - 08/23/19 [id number]      Assessment   Medical Diagnosis  Parkinson's disease    Referring Provider (PT)  Gillermo Murdoch    Onset Date/Surgical Date  07/15/19   PD screen January 2021   Hand Dominance  Left      Precautions   Precautions  Fall      Balance Screen   Has the patient fallen in the past 6 months  Yes    How many times?  1    Has the patient had a decrease in activity level because of a fear of falling?   No    Is the patient reluctant to leave their home because of a fear of falling?   No      Home Environment   Living Environment  Private residence    Living Arrangements  Spouse/significant other    Available Help at Discharge  Family    Type of [place] Access  Level entry    [place]  Two level;Able to live on main level with bedroom/bathroom   Office is on second level   Alternate Level Stairs-Number of Steps  12    Alternate Level Stairs-Rails  Wall - 2 wheels;Cane - single point      Prior Function   Level of Independence  Independent    Leisure  Enjoys playing, has been going to [facility] in the past (stopped due to  COVID), enjoys playing golf       Observation/Other Assessments   Focus on Therapeutic Outcomes (FOTO)   NA      Posture/Postural Control   Posture/Postural Control  Postural limitations    Postural Limitations  Rounded Shoulders;Forward head      ROM / Strength   AROM / PROM / Strength  Strength      Strength   Overall Strength  Deficits    Overall Strength Comments  Grossly tested 5/5 hip flexion, quads, hamstrings, L ankle dorsiflexion 5/5, R ankle dorsilfexion 3+/5      Transfers   Transfers  Sit to Stand;Stand to Sit    Sit to Stand  6: Modified independent (Device/Increase time);Without upper extremity assist;From chair/3-in-1    Five time sit to stand comments   12.5    Stand to Sit  6: Modified independent (Device/Increase time);Without upper extremity assist;To chair/3-in-1      Ambulation/Gait   Ambulation/Gait  Yes    Ambulation/Gait Assistance  5: Supervision    Ambulation Distance (Feet)  150 Feet    Assistive device  Straight cane    Gait Pattern  Step-through pattern;Decreased step length - right;Decreased step length - left;Decreased hip/knee flexion - right;Decreased hip/knee flexion - left;Decreased dorsiflexion - right;Festinating;Decreased trunk rotation   Posterior lean in static standing and with turns   Ambulation Surface  Level;Indoor    Gait velocity  10.62 sec = 3.09 ft/sec   Pt reports this is faster than he normally walks     Standardized Balance Assessment   Standardized Balance Assessment  Timed Up and Go Test      Timed Up and Go Test   Normal TUG (seconds)  15.29    Manual TUG (seconds)  12.56    Cognitive TUG (seconds)  12.53    TUG Comments  TUG score >13.5 sec indicates increased fall risk.      High Level Balance   High Level Balance Comments  MiniBESTest score:  14/28         Mini-BESTest: Balance Evaluation Systems Test  2005-2013 [facility] All rights  reserved. ________________________________________________________________________________________Anticipatory_________Subscore____2_/6 1. SIT TO STAND Instruction: "Cross your arms across your chest. Try not to use your hands unless you must.Do not let your legs lean against the back of the chair  when you stand. Please stand up now." X(2) Normal: Comes to stand without use of hands and stabilizes independently. (1) Moderate: Comes to stand WITH use of hands on first attempt. (0) Severe: Unable to stand up from chair without assistance, OR needs several attempts with use of hands. 2. RISE TO TOES Instruction: "Place your feet shoulder width apart. Place your hands on your hips. Try to rise as high as you can onto your toes. I will count out loud to 3 seconds. Try to hold this pose for at least 3 seconds. Look straight ahead. Rise now." (2) Normal: Stable for 3 s with maximum height. (1) Moderate: Heels up, but not full range (smaller than when holding hands), OR noticeable instability for 3 s. X(0) Severe: < 3 s. 3. STAND ON ONE LEG Instruction: "Look straight ahead. Keep your hands on your hips. Lift your leg off of the ground behind you without touching or resting your raised leg upon your other standing leg. Stay standing on one leg as long as you can. Look straight ahead. Lift now." Left: Time in Seconds Trial 1:_____Trial 2:_____ (2) Normal: 20 s. (1) Moderate: < 20 s. X(0) Severe: Unable. Right: Time in Seconds Trial 1:_____Trial 2:_____ (2) Normal: 20 s. (1) Moderate: < 20 s. X(0) Severe: Unable To score each side separately use the trial with the longest time. To calculate the sub-score and total score use the side [left or right] with the lowest numerical score [i.e. the worse side]. ______________________________________________________________________________________Reactive Postural Control___________Subscore:___3__/6 4. COMPENSATORY STEPPING CORRECTION- FORWARD Instruction:  "Stand with your feet shoulder width apart, arms at your sides. Lean forward against my hands beyond your forward limits. When I let go, do whatever is necessary, including taking a step, to avoid a fall." (2) Normal: Recovers independently with a single, large step (second realignment step is allowed). X(1) Moderate: More than one step used to recover equilibrium. (0) Severe: No step, OR would fall if not caught, OR falls spontaneously. 5. COMPENSATORY STEPPING CORRECTION- BACKWARD Instruction: "Stand with your feet shoulder width apart, arms at your sides. Lean backward against my hands beyond your backward limits. When I let go, do whatever is necessary, including taking a step, to avoid a fall." (2) Normal: Recovers independently with a single, large step. (1) Moderate: More than one step used to recover equilibrium. X(0) Severe: No step, OR would fall if not caught, OR falls spontaneously. 6. COMPENSATORY STEPPING CORRECTION- LATERAL Instruction: "Stand with your feet together, arms down at your sides. Lean into my hand beyond your sideways limit. When I let go, do whatever is necessary, including taking a step, to avoid a fall." Left X(2) Normal: Recovers independently with 1 step (crossover or lateral OK). (1) Moderate: Several steps to recover equilibrium. (0) Severe: Falls, or cannot step. Right X(2) Normal: Recovers independently with 1 step (crossover or lateral OK). (1) Moderate: Several steps to recover equilibrium. (0) Severe: Falls, or cannot step. Use the side with the lowest score to calculate sub-score and total score. ____________________________________________________________________________________Sensory Orientation_____________Subscore:_____4____/6 7. STANCE (FEET TOGETHER); EYES OPEN, FIRM SURFACE Instruction: "Place your hands on your hips. Place your feet together until almost touching. Look straight ahead. Be as stable and still as possible, until I say  stop." Time in seconds:________ X(2) Normal: 30 s. (1) Moderate: < 30 s. (0) Severe: Unable. 8. STANCE (FEET TOGETHER); EYES CLOSED, FOAM SURFACE Instruction: "Step onto the foam. Place your hands on your hips. Place your feet together until almost touching. Be as stable and  still as possible, until I say stop. I will start timing when you close your eyes." Time in seconds:________ (2) Normal: 30 s. (1) Moderate: < 30 s. X(0) Severe: Unable. 9. INCLINE- EYES CLOSED Instruction: "Step onto the incline ramp. Please stand on the incline ramp with your toes toward the top. Place your feet shoulder width apart and have your arms down at your sides. I will start timing when you close your eyes." Time in seconds:________ X(2) Normal: Stands independently 30 s and aligns with gravity. (1) Moderate: Stands independently <30 s OR aligns with surface. (0) Severe: Unable. _________________________________________________________________________________________Dynamic Gait ______Subscore______5__/10 10. CHANGE IN GAIT SPEED Instruction: "Begin walking at your normal speed, when I tell you 'fast', walk as fast as you can. When I say 'slow', walk very slowly." (2) Normal: Significantly changes walking speed without imbalance. X(1) Moderate: Unable to change walking speed or signs of imbalance. (0) Severe: Unable to achieve significant change in walking speed AND signs of imbalance. [place] - HORIZONTAL Instruction: "Begin walking at your normal speed, when I say "right", turn your head and look to the right. When I say "left" turn your head and look to the left. Try to keep yourself walking in a straight line." (2) Normal: performs head turns with no change in gait speed and good balance. X(1) Moderate: performs head turns with reduction in gait speed. (0) Severe: performs head turns with imbalance. 12. WALK WITH PIVOT TURNS Instruction: "Begin walking at your normal speed. When I  tell you to 'turn and stop', turn as quickly as you can, face the opposite direction, and stop. After the turn, your feet should be close together." (2) Normal: Turns with feet close FAST (< 3 steps) with good balance. (1) Moderate: Turns with feet close SLOW (>4 steps) with good balance. X(0) Severe: Cannot turn with feet close at any speed without imbalance. 13. STEP OVER OBSTACLES Instruction: "Begin walking at your normal speed. When you get to the box, step over it, not around it and keep walking." (2) Normal: Able to step over box with minimal change of gait speed and with good balance. X(1) Moderate: Steps over box but touches box OR displays cautious behavior by slowing gait. (0) Severe: Unable to step over box OR steps around box. 14. TIMED UP & GO WITH DUAL TASK [3 METER WALK] Instruction TUG: "When I say 'Go', stand up from chair, walk at your normal speed across the tape on the floor, turn around, and come back to sit in the chair." Instruction TUG with Dual Task: "Count backwards by threes starting at ___. When I say 'Go', stand up from chair, walk at your normal speed across the tape on the floor, turn around, and come back to sit in the chair. Continue counting backwards the entire time." TUG: ________seconds; Dual Task TUG: ________seconds X(2) Normal: No noticeable change in sitting, standing or walking while backward counting when compared to TUG without Dual Task. (1) Moderate: Dual Task affects either counting OR walking (>10%) when compared to the TUG without Dual Task. (0) Severe: Stops counting while walking OR stops walking while counting. When scoring item 14, if subject's gait speed slows more than 10% between the TUG without and with a Dual Task the score should be decreased by a point. TOTAL SCORE: ____14____/28        Objective measurements completed on examination: See above findings.              PT Education -  08/23/19 [id number]    Education  Details  Eval results, POC    Person(s) Educated  Patient    Methods  Explanation    Comprehension  Verbalized understanding       PT Short Term Goals - 08/23/19 0901      PT SHORT TERM GOAL #1   Title  Pt will be independent with HEP to address balance, transfers, and gait.  TARGET 09/24/2019    Time  4    Period  Weeks    Status  New      PT SHORT TERM GOAL #2   Title  Pt will improve TUG score to less than or equal to 13.5 sec for decreased fall risk.    Time  4    Period  Weeks    Status  New    Target Date  09/24/19      PT SHORT TERM GOAL #3   Title  Pt will improve MiniBESTest score to at least 17/28 for decreased fall risk.    Time  4    Period  Weeks    Status  New        PT Long Term Goals - 08/23/19 0902      PT LONG TERM GOAL #1   Title  Pt will be independent with progression of HEP to address balance, gait.  TARGET 10/08/2019    Time  6    Period  Weeks    Status  New      PT LONG TERM GOAL #2   Title  Pt will improve MiniBESTest score to at least 20/28 for decreased fall risk.    Time  6    Period  Weeks    Status  New      PT LONG TERM GOAL #3   Title  Pt will recover posterior LOB in 2 steps or less to improve balance recovery.    Time  6    Period  Weeks    Status  New      PT LONG TERM GOAL #4   Title  Pt will verbalize plans for return to community fitness upon d/c from PT.    Time  6    Period  Weeks    Status  New             Plan - 08/23/19 0853    Clinical Impression Statement  Pt is 69 year old male who presents to OPPT with history of Parkinson's disease, who was seen for 6 month return PD screen in January 2021, with PT eval recommended due to slowed mobility meausres and changes in balance since d/c from PT June 2020.  Pt presents today with decreased balance, fall risk per TUG and MiniBESTest scores.  He demonstrates festination of gait, posterior LOB and decreased ability to recover balance in posterior direction.  He has  had one fall in past 6 months.  He would benefit from skilled PT to address the above stated deficits to decrease fall risk and improve overall balance and mobility.    Personal Factors and Comorbidities  Comorbidity 3+    Comorbidities  PMH Parkinson's disease, neck surgery, shoulder surgery, back surgery    Examination-Activity Limitations  Locomotion Level;Transfers;Stairs    Examination-Participation Restrictions  Community Activity;Other   Return to [facility] exercise   Stability/Clinical Decision Making  Evolving/Moderate complexity    Clinical Decision Making  Moderate    Rehab Potential  Fair    PT Frequency  2x /  week    PT Duration  6 weeks   plus eval   PT Treatment/Interventions  ADLs/Self Care Home Management;Gait training;Stair training;Functional mobility training;Therapeutic activities;Therapeutic exercise;Balance training;Neuromuscular re-education;Patient/family education    PT Next Visit Plan  Review pt's previous HEP; focus on foot positions for static standing and for transfers, weightshifting; work on compliant surfaces, step strategy work    Oncologist with Plan of Care  Patient       Patient will benefit from skilled therapeutic intervention in order to improve the following deficits and impairments:  Abnormal gait, Difficulty walking, Decreased balance, Decreased mobility, Postural dysfunction  Visit Diagnosis: Other abnormalities of gait and mobility  Unsteadiness on feet  Abnormal posture  Muscle weakness (generalized)     Problem List There are no problems to display for this patient.   Frazier Butt. 08/23/2019, 9:05 AM  [facility] [address] [place], [place], [address] Phone: [phone]   Fax:  [phone]  Name: Jim Carson MRN: [id number] Date of Birth: 1951/01/23

## 2019-08-26 ENCOUNTER — Ambulatory Visit: Payer: Medicare Other | Admitting: Physical Therapy

## 2019-08-26 ENCOUNTER — Other Ambulatory Visit: Payer: Self-pay

## 2019-08-26 DIAGNOSIS — R2689 Other abnormalities of gait and mobility: Secondary | ICD-10-CM

## 2019-08-26 DIAGNOSIS — R2681 Unsteadiness on feet: Secondary | ICD-10-CM

## 2019-08-26 NOTE — Therapy (Signed)
[facility] [address] [place], [place], [address] Phone: [phone]   Fax:  [phone]  Physical Therapy Treatment  Patient Details  Name: Jim Carson MRN: [id number] Date of Birth: 04/15/1951 Referring Provider (PT): Gillermo Murdoch   Encounter Date: 08/26/2019  PT End of Session - 08/26/19 1224    Visit Number  2    Number of Visits  13    Date for PT Re-Evaluation  11/21/19    Authorization Type  Medicare/[employer]-will need 10th visit progress note    PT Start Time  0934    PT Stop Time  1013    PT Time Calculation (min)  39 min    Activity Tolerance  Patient tolerated treatment well    Behavior During Therapy  [facility] for tasks assessed/performed       Past Medical History:  Diagnosis Date  . Abnormal gait    due to parkinson's,  uses cane  . Allergic rhinitis   . Benign essential hypertension   . Chronic constipation   . Deviated septum   . GERD (gastroesophageal reflux disease)   . History of multiple concussions    per pt as teen playing football, no residual  . Hypothyroidism    followed by pcp  . Left shoulder pain   . OA (osteoarthritis)   . OSA on CPAP   . Parkinson disease [facility])    neurologist-- dr b. Nicki Reaper  @[facility]  . Vitamin B12 deficiency   . Vitamin D deficiency   . Wears glasses     Past Surgical History:  Procedure Laterality Date  . APPENDECTOMY  07/1964  . LAPAROSCOPIC CHOLECYSTECTOMY  01-21-2017   @[facility] , [place]  . POSTERIOR FUSION CERVICAL SPINE  08-15-2011   @[facility] , [place]   w/ laminectomy and decompression-- C3 -- T1  . POSTERIOR LAMINECTOMY / DECOMPRESSION LUMBAR SPINE  07-30-2013   @[facility] , [place]   bilateral T11 - 12,  L2 --5  . 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 SHOULDER ARTHROSCOPY WITH ROTATOR CUFF REPAIR Left 12/15/2018   Procedure: SHOULDER ARTHROSCOPY, biceps tenodesis labored Debridement, submacromial  decompression;  Surgeon: Sydnee Cabal, MD;  Location: [facility];  Service: Orthopedics;  Laterality: Left;  interscalene block  . 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  Subjective Assessment - 08/26/19 0935    Subjective  Got my second Covid vaccine; no other changes.    Patient Stated Goals  Pt's goals for therapy are to improve my balance.    Currently in Pain?  No/denies         Neuro Re-education Performed previous HEP, as noted below:    Figure Eight-Performed in session 3 reps with cane    Walk in a figure eight pattern.  Make sure to make space for wide, walking turn. Repeat __3__ times per session. Do _1___ session  per day. (This turn is for when you have a large area, and the turn becomes more of a walking turn)  Copyright  VHI. All rights reserved.  Turning in Place: Solid Surface-In session, performed 3 reps clockwise and counterclockwise, with cane    Standing in place, lead with head and turn slowly making quarter turns toward left. (THINK:  Side step and turn to 3:00, 6:00, 9:00, 12:00; "clock turns").   Repeat __3__ times per session, each direction. Do _1-2___  sessions per day.    Weight Shift: Diagonal    Slowly shift weight forward over right leg. Return to starting position. Shift backward over opposite leg. Hold each position _3___ seconds. Repeat __10__ times per session. Do __2-3__ sessions per day.   Copyright  VHI. All rights reserved.  Ankle Bend: Dorsiflexion / Plantar Flexion, Sitting    Sit with feet on floor. Point toes up, keeping both heels on floor. Then press toes to floor raising heels. Hold each position _3__ seconds. Repeat __2 sets of 10_ times per session. Do _2__ sessions per day.  Copyright  VHI. All rights reserved.  Ankle Plantar Flexion: Long-Sitting (Single Leg) (verbally reviewed-pt is performing at home)    Loop tubing around foot  of straight leg, anchor with one hand. Leg straight, point toes downward. Repeat _10_ times per set. Repeat with other leg. Do _2_ sets per session. Do _1-2_ sessions per day.         Pt performs PWR! Moves in standing position x 10reps (at counter for support)   PWR! Up for improved posture  PWR! Rock for improved weighshifting  PWR! Twist for improved trunk rotation (in corner, reaching across body for target at wall, stop in middle at chair briefly)  PWR! Step for improved step initiation-side x 10 reps, back x 10 reps (counter support), forward x 10 reps (counter support), then forward<>back step and weightshift x 10 reps each leg.  Cues provided for technique and optimal foot positioning          [facility] Adult PT Treatment/Exercise - 08/26/19 0001      Transfers   Transfers  Sit to Stand;Stand to Sit    Sit to Stand  5: Supervision;Without upper extremity assist;From bed    Stand to Sit  6: Modified independent (Device/Increase time);Without upper extremity assist;To chair/3-in-1    Comments  Performed sit<>stand in stride stance, x 8 reps each foot position.  Pt is more steady with LLE in posterior position.  Then transitioned to sit<>stand in stride stance, then short distance gait/turns, 10 ft x 2, using cane.             PT Education - 08/26/19 1223    Education Details  Reviewed HEP and reprinted pictures; discussed doing exercises daily    Person(s) Educated  Patient    Methods  Explanation;Demonstration;Handout    Comprehension  Verbalized understanding;Returned demonstration;Verbal cues required       PT Short Term Goals - 08/23/19 0901      PT SHORT TERM GOAL #1   Title  Pt will be independent with HEP to address balance, transfers, and gait.  TARGET 09/24/2019    Time  4    Period  Weeks    Status  New      PT SHORT TERM GOAL #2   Title  Pt will improve TUG score to less than or equal to 13.5 sec for decreased fall risk.    Time  4    Period   Weeks    Status  New    Target Date  09/24/19      PT SHORT TERM GOAL #3   Title  Pt will improve MiniBESTest score to at least 17/28 for decreased fall risk.    Time  4    Period  Weeks    Status  New        PT Long Term Goals - 08/23/19 0902      PT LONG TERM GOAL #  1   Title  Pt will be independent with progression of HEP to address balance, gait.  TARGET 10/08/2019    Time  6    Period  Weeks    Status  New      PT LONG TERM GOAL #2   Title  Pt will improve MiniBESTest score to at least 20/28 for decreased fall risk.    Time  6    Period  Weeks    Status  New      PT LONG TERM GOAL #3   Title  Pt will recover posterior LOB in 2 steps or less to improve balance recovery.    Time  6    Period  Weeks    Status  New      PT LONG TERM GOAL #4   Title  Pt will verbalize plans for return to community fitness upon d/c from PT.    Time  6    Period  Weeks    Status  New            Plan - 08/26/19 1224    Clinical Impression Statement  Reviewed pt's previous HEP from bout of therapy at [facility] ending 11/2018.  He admits he has not been great about doing HEP consistently.  Pt requires cues for optimal BOS, for weightshifting and for step strategy work.  He will continue to beneift from skilled PT to further address balance, gait and overall functional mobility.    Personal Factors and Comorbidities  Comorbidity 3+    Comorbidities  PMH Parkinson's disease, neck surgery, shoulder surgery, back surgery    Examination-Activity Limitations  Locomotion Level;Transfers;Stairs    Examination-Participation Restrictions  Community Activity;Other   Return to [facility] exercise   Stability/Clinical Decision Making  Evolving/Moderate complexity    Rehab Potential  Fair    PT Frequency  2x / week    PT Duration  6 weeks   plus eval   PT Treatment/Interventions  ADLs/Self Care Home Management;Gait training;Stair training;Functional mobility training;Therapeutic  activities;Therapeutic exercise;Balance training;Neuromuscular re-education;Patient/family education    PT Next Visit Plan  Ask how HEP is going-has he been doing daily? focus on foot positions for static standing and transfers, weigthshfiting, work on compliant surfaces,s tep strategy work    Oncologist with Plan of Care  Patient       Patient will benefit from skilled therapeutic intervention in order to improve the following deficits and impairments:  Abnormal gait, Difficulty walking, Decreased balance, Decreased mobility, Postural dysfunction  Visit Diagnosis: Other abnormalities of gait and mobility  Unsteadiness on feet     Problem List There are no problems to display for this patient.   Kamdyn Colborn W. 08/26/2019, 12:27 PM  Frazier Butt., PT    [facility] [address] [place], [place], [address] Phone: [phone]   Fax:  [phone]  Name: Jim Carson MRN: [id number] Date of Birth: Feb 12, 1951

## 2019-08-26 NOTE — Patient Instructions (Signed)
    Figure Eight    Walk in a figure eight pattern.  Make sure to make space for wide, walking turn. Repeat __3__ times per session. Do _1___ session  per day. (This turn is for when you have a large area, and the turn becomes more of a walking turn)  Copyright  VHI. All rights reserved.  Turning in Place: Solid Surface    Standing in place, lead with head and turn slowly making quarter turns toward left. (THINK:  Side step and turn to 3:00, 6:00, 9:00, 12:00; "clock turns").   Repeat __3__ times per session, each direction. Do _1-2___ sessions per day.    Weight Shift: Diagonal    Slowly shift weight forward over right leg. Return to starting position. Shift backward over opposite leg. Hold each position _3___ seconds. Repeat __10__ times per session. Do __2-3__ sessions per day.   Copyright  VHI. All rights reserved.  Ankle Bend: Dorsiflexion / Plantar Flexion, Sitting    Sit with feet on floor. Point toes up, keeping both heels on floor. Then press toes to floor raising heels. Hold each position _3__ seconds. Repeat __2 sets of 10_ times per session. Do _2__ sessions per day.  Copyright  VHI. All rights reserved.  Ankle Plantar Flexion: Long-Sitting (Single Leg)    Loop tubing around foot of straight leg, anchor with one hand. Leg straight, point toes downward. Repeat _10_ times per set. Repeat with other leg. Do _2_ sets per session. Do _1-2_ sessions per day.

## 2019-08-31 ENCOUNTER — Ambulatory Visit: Payer: Medicare Other | Admitting: Physical Therapy

## 2019-08-31 ENCOUNTER — Ambulatory Visit: Payer: Medicare Other

## 2019-08-31 ENCOUNTER — Ambulatory Visit: Payer: Medicare Other | Attending: Physician Assistant | Admitting: Occupational Therapy

## 2019-08-31 ENCOUNTER — Other Ambulatory Visit: Payer: Self-pay

## 2019-08-31 DIAGNOSIS — R278 Other lack of coordination: Secondary | ICD-10-CM

## 2019-08-31 DIAGNOSIS — R293 Abnormal posture: Secondary | ICD-10-CM | POA: Diagnosis present

## 2019-08-31 DIAGNOSIS — R2681 Unsteadiness on feet: Secondary | ICD-10-CM | POA: Diagnosis present

## 2019-08-31 DIAGNOSIS — R471 Dysarthria and anarthria: Secondary | ICD-10-CM | POA: Diagnosis present

## 2019-08-31 DIAGNOSIS — M25612 Stiffness of left shoulder, not elsewhere classified: Secondary | ICD-10-CM | POA: Insufficient documentation

## 2019-08-31 DIAGNOSIS — R2689 Other abnormalities of gait and mobility: Secondary | ICD-10-CM | POA: Diagnosis present

## 2019-08-31 DIAGNOSIS — R1312 Dysphagia, oropharyngeal phase: Secondary | ICD-10-CM | POA: Diagnosis present

## 2019-08-31 DIAGNOSIS — M6281 Muscle weakness (generalized): Secondary | ICD-10-CM | POA: Insufficient documentation

## 2019-08-31 DIAGNOSIS — M25512 Pain in left shoulder: Secondary | ICD-10-CM | POA: Diagnosis present

## 2019-08-31 DIAGNOSIS — R29818 Other symptoms and signs involving the nervous system: Secondary | ICD-10-CM

## 2019-08-31 NOTE — Patient Instructions (Signed)
  TWICE A DAY: Get serious about - 10 loud "ah" (ABDOMINAL BREATH!),  - 10 hi-lo and 10 lo-hi LOUDLY - 10 "every day" sentences LOUDLY  Bring your "every day" sentences in to therapy, or you can email them to me: [email or web address]  KEEP DOING THE SWALLOW EXERCISE, 10-15 reps twice a day

## 2019-08-31 NOTE — Therapy (Signed)
[facility] [address] [place], [place], [address] Phone: [phone]   Fax:  [phone]  Speech Language Pathology Treatment  Patient Details  Name: Jim Carson MRN: [id number] Date of Birth: Feb 08, 1951 Referring Provider (SLP): Modena Nunnery., PA   Encounter Date: 08/31/2019  End of Session - 08/31/19 1653    Visit Number  1    Number of Visits  17    Date for SLP Re-Evaluation  11/29/19   90 days   SLP Start Time  0850    SLP Stop Time   0930    SLP Time Calculation (min)  40 min    Activity Tolerance  Patient tolerated treatment well       Past Medical History:  Diagnosis Date  . Abnormal gait    due to parkinson's,  uses cane  . Allergic rhinitis   . Benign essential hypertension   . Chronic constipation   . Deviated septum   . GERD (gastroesophageal reflux disease)   . History of multiple concussions    per pt as teen playing football, no residual  . Hypothyroidism    followed by pcp  . Left shoulder pain   . OA (osteoarthritis)   . OSA on CPAP   . Parkinson disease [facility])    neurologist-- dr b. scott  @[facility]  . Vitamin B12 deficiency   . Vitamin D deficiency   . Wears glasses     Past Surgical History:  Procedure Laterality Date  . APPENDECTOMY  07/1964  . LAPAROSCOPIC CHOLECYSTECTOMY  01-21-2017   @[facility] , [place]  . POSTERIOR FUSION CERVICAL SPINE  08-15-2011   @[facility] , [place]   w/ laminectomy and decompression-- C3 -- T1  . POSTERIOR LAMINECTOMY / DECOMPRESSION LUMBAR SPINE  07-30-2013   @[facility] , [place]   bilateral T11 - 12,  L2 --5  . 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 SHOULDER ARTHROSCOPY WITH ROTATOR CUFF REPAIR Left 12/15/2018   Procedure: SHOULDER ARTHROSCOPY, biceps tenodesis labored Debridement, submacromial decompression;  Surgeon: Sydnee Cabal, MD;  Location: [facility];  Service: Orthopedics;   Laterality: Left;  interscalene block  . 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  Subjective Assessment - 08/31/19 0857    Subjective  Pt recalls and performs loud /a/ and the pitch variations from his LOUD therapy at [facility].    Currently in Pain?  No/denies        SLP Evaluation [facility] - 08/31/19 ID:[id number]      SLP Visit Information   SLP Received On  08/31/19    Referring Provider (SLP)  Modena Nunnery., PA    Onset Date  2012    Medical Diagnosis  Parkinson's Disesae      Subjective   Patient/Family Stated Goal  "I want to consistently talk loud. I saw loud, but it's normal loudness. It seems like I'm shouting."      General Information   HPI  Pt loudness measured at suboptimal at PD screen 07-15-19. Pt had modified (MBSS) at [facility] in July 2020, with some minor differences with pt's pharyngeal stage. SLP Starleen Blue) recommened chin tuck against resistance exercise and self-regulated regular diet with thin liquids with sip-hold-hard swallow technique.       Prior Functional Status   Cognitive/Linguistic Baseline  Within functional limits    Type of Home  House     Lives With  Spouse    Vocation  Retired      Associate Professor   Overall Cognitive Status  Within Functional Limits for tasks assessed      Auditory Comprehension   Overall Auditory Comprehension  Appears within functional limits for tasks assessed      Verbal Expression   Overall Verbal Expression  Appears within functional limits for tasks assessed      Oral Motor/Sensory Function   Overall Oral Motor/Sensory Function  --   deferred due to masking guideline     Motor Speech   Phonation  Low vocal intensity   mid-upper 60s dB,meter 30 cm away, masked, 5 minutes simple    Intelligibility  Intelligible   likely difficult to hear in background noise   Phonation  Impaired    Volume  Appropriate   with focused effort loud speech-5 minutes simple  conversatio          SLP Education - 08/31/19 1652    Education Details  loud /a/, hi-lo and lo-hi, and "every day" sentences are the daily speech tasks, need to do speech tasks every day as directed for improved and lasting benefits for speech intelligibilty    Person(s) Educated  Patient    Methods  Explanation;Demonstration;Verbal cues;Handout    Comprehension  Verbalized understanding;Returned demonstration;Verbal cues required;Need further instruction       SLP Short Term Goals - 08/31/19 1656      SLP SHORT TERM GOAL #1   Title  pt to maintain loud /a/ for at least 10 seconds with upper 80s -low 90s dB in 5 therapy sessions    Time  4    Period  Weeks    Status  New      SLP SHORT TERM GOAL #2   Title  pt will produce WNLspeech volume in 10 minutes simple conversation in 3 sessions    Time  4    Period  Weeks    Status  New      SLP SHORT TERM GOAL #3   Title  pt will demo abdomoinal breathing in sentence responses 75% of the time in 3 sessions    Time  4    Period  Weeks    Status  New       SLP Long Term Goals - 08/31/19 1658      SLP LONG TERM GOAL #1   Title  pt will demo WNL speech volume in 10 minutes mod complex-complex conversation over 3 sessions    Time  8    Period  Weeks   or 17 sessions, for all LTGs     SLP LONG TERM GOAL #2   Title  pt will demo abdominal breathing75% of the time in 10 minutes simple conversation in 3 sessions    Time  8    Period  Weeks    Status  New      SLP LONG TERM GOAL #3   Title  pt will maintain loud /a at low 90s dB average in 4 sessions    Time  8    Period  Weeks    Status  New       Plan - 08/31/19 1654    Clinical Impression Statement  Pt presents with suboptimal speech volume in conversation - has had LOUD before but has "forgotten" how to speak in WNL volume. After loud /a/, pt was much better at maintaining WNL volume - in 5 minutes  simple conversation his volume was low 70s dB. Prior to this it was in  mid-upper 60s in 5 minutes simple conversation. Pt will benefit from skilled ST targeting WNL volume in more complex conversation.    Speech Therapy Frequency  2x / week    Duration  --   8 weeks or 17 total sessions   Treatment/Interventions  Functional tasks;SLP instruction and feedback;Compensatory strategies;Patient/family education;Internal/external aids;Cueing hierarchy;Environmental controls    Potential to Achieve Goals  Good    SLP Home Exercise Plan  provided today    Consulted and Agree with Plan of Care  Patient       Patient will benefit from skilled therapeutic intervention in order to improve the following deficits and impairments:   Dysarthria and anarthria    Problem List There are no problems to display for this patient.   [facility] ,[place], [place]  08/31/2019, 5:00 PM  [facility] [address] [place], [place], [address] Phone: [phone]   Fax:  [phone]   Name: Jim Carson MRN: [id number] Date of Birth: 1951-05-26

## 2019-08-31 NOTE — Therapy (Signed)
[facility] [address] [place], [place], [address] Phone: [phone]   Fax:  [phone]  Physical Therapy Treatment  Patient Details  Name: Jim Carson MRN: [id number] Date of Birth: [birth year] Referring Provider (PT): Gillermo Murdoch   Encounter Date: 08/31/2019  PT End of Session - 08/31/19 1302    Visit Number  3    Number of Visits  13    Date for PT Re-Evaluation  11/21/19    Authorization Type  Medicare/[employer]-will need 10th visit progress note    PT Start Time  0805    PT Stop Time  0845    PT Time Calculation (min)  40 min    Activity Tolerance  Patient tolerated treatment well    Behavior During Therapy  [facility] for tasks assessed/performed       Past Medical History:  Diagnosis Date  . Abnormal gait    due to parkinson's,  uses cane  . Allergic rhinitis   . Benign essential hypertension   . Chronic constipation   . Deviated septum   . GERD (gastroesophageal reflux disease)   . History of multiple concussions    per pt as teen playing football, no residual  . Hypothyroidism    followed by pcp  . Left shoulder pain   . OA (osteoarthritis)   . OSA on CPAP   . Parkinson disease [facility])    neurologist-- dr b. scott  @[facility]  . Vitamin B12 deficiency   . Vitamin D deficiency   . Wears glasses     Past Surgical History:  Procedure Laterality Date  . APPENDECTOMY  07/1964  . LAPAROSCOPIC CHOLECYSTECTOMY  01-21-2017   @[facility] , [place]  . POSTERIOR FUSION CERVICAL SPINE  08-15-2011   @[facility] , [place]   w/ laminectomy and decompression-- C3 -- T1  . POSTERIOR LAMINECTOMY / DECOMPRESSION LUMBAR SPINE  07-30-2013   @[facility] ,    bilateral T11 - 12,  L2 --5  . 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 SHOULDER ARTHROSCOPY WITH ROTATOR CUFF REPAIR Left 12/15/2018   Procedure: SHOULDER ARTHROSCOPY, biceps tenodesis labored Debridement, submacromial  decompression;  Surgeon: Sydnee Cabal, MD;  Location: [facility];  Service: Orthopedics;  Laterality: Left;  interscalene block  . 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  Subjective Assessment - 08/31/19 0807    Subjective  Did all my exercises yesterday; went out of town for the weekend and did some walking.    Patient Stated Goals  Pt's goals for therapy are to improve my balance.    Currently in Pain?  No/denies                       Jim Carson      Transfers   Transfers  Sit to Stand;Stand to Sit    Sit to Stand  5: Supervision;Without upper extremity assist;From bed;4: Min guard    Sit to Stand Details  Tactile cues for sequencing;Tactile cues for weight shifting;Verbal cues for technique;Verbal cues for sequencing    Sit to Stand Details (indicate cue type and reason)  Pt tends to have increased posterior lean upon standing; cues for initial forward lean, bringing hips slightly more anterior upon standing    Stand to Sit  5: Supervision;Without upper extremity assist;To bed  Stand to Sit Details (indicate cue type and reason)  Verbal cues for sequencing;Verbal cues for technique    Number of Reps  Other reps (comment);Other sets (comment)   5 reps wide BOS, then 5 reps stagger stance   Comments  5 additional reps with pt standing feet shoulder width apart on Airex, with min guard/min assist for balance upon standing.          Balance Exercises - 08/31/19 0811      Balance Exercises: Standing   Rockerboard  Anterior/posterior;Lateral;Head turns;Other (comment);EO   ankle/hip strategy work x 10, UE lifts, head nods x 10 each   Balance Beam  Standing on balance beam with step strategy work:  forward x 10, back x 10, side x 10, forward>back x 10.  BUE support throughout, cues for increased foot clearance.    Other Standing Exercises  Stagger  standing weightshifting at counter, x 10 reps each position-focus on weigthshfiting through hips.  Lateral weightshifting R and L, 10 reps, with cues for smooth weigthshfiting through hips, then shift and lift for more SLS stance work x 10 reps each.      Standing on rockerboard:  Forward lean>forward step taps x 10 reps each leg.     PT Short Term Goals - 08/23/19 0901      PT SHORT TERM GOAL #1   Title  Pt will be independent with HEP to address balance, transfers, and gait.  TARGET 09/24/2019    Time  4    Period  Weeks    Status  New      PT SHORT TERM GOAL #2   Title  Pt will improve TUG score to less than or equal to 13.5 sec for decreased fall risk.    Time  4    Period  Weeks    Status  New    Target Date  09/24/19      PT SHORT TERM GOAL #3   Title  Pt will improve MiniBESTest score to at least 17/28 for decreased fall risk.    Time  4    Period  Weeks    Status  New        PT Long Term Goals - 08/23/19 0902      PT LONG TERM GOAL #1   Title  Pt will be independent with progression of HEP to address balance, gait.  TARGET 10/08/2019    Time  6    Period  Weeks    Status  New      PT LONG TERM GOAL #2   Title  Pt will improve MiniBESTest score to at least 20/28 for decreased fall risk.    Time  6    Period  Weeks    Status  New      PT LONG TERM GOAL #3   Title  Pt will recover posterior LOB in 2 steps or less to improve balance recovery.    Time  6    Period  Weeks    Status  New      PT LONG TERM GOAL #4   Title  Pt will verbalize plans for return to community fitness upon d/c from PT.    Time  6    Period  Weeks    Status  New            Plan - 08/31/19 1302    Clinical Impression Statement  Continued to work on [place] activities as well as compliant surface work  and transfer training.  Pt's plantarflexor weakness comes through with difficulty with ankle strategy and with pt's tendency for increased posterior lean upon standing.  Will  continue to benefit from skilled PT to further address balance, gait, functional strength and overall functional mobility.    Personal Factors and Comorbidities  Comorbidity 3+    Comorbidities  PMH Parkinson's disease, neck surgery, shoulder surgery, back surgery    Examination-Activity Limitations  Locomotion Level;Transfers;Stairs    Examination-Participation Restrictions  Community Activity;Other   Return to [facility] exercise   Stability/Clinical Decision Making  Evolving/Moderate complexity    Rehab Potential  Fair    PT Frequency  2x / week    PT Duration  6 weeks   plus eval   PT Treatment/Interventions  ADLs/Self Care Home Management;Gait training;Stair training;Functional mobility training;Therapeutic activities;Therapeutic exercise;Balance training;Neuromuscular re-education;Patient/family education    PT Next Visit Plan  Continue compliant surface work; weightshifting through hips for hip/ankle strategy work; step strategy; transfer training to decrease posterior lean    Consulted and Agree with Plan of Care  Patient       Patient will benefit from skilled therapeutic intervention in order to improve the following deficits and impairments:  Abnormal gait, Difficulty walking, Decreased balance, Decreased mobility, Postural dysfunction  Visit Diagnosis: Unsteadiness on feet  Muscle weakness (generalized)     Problem List There are no problems to display for this patient.   Jim Dibbern W. 08/31/2019, 1:07 PM Jim Butt[address] [facility] [address] [place], [place], [address] Phone: [phone]   Fax:  [phone]  Name: Jim Carson MRN: [id number] Date of Birth: 1951/06/05

## 2019-09-01 NOTE — Therapy (Signed)
[facility] [address] [place], [place], [address] Phone: [phone]   Fax:  [phone]  Occupational Therapy Evaluation  Patient Details  Name: Jim Carson MRN: [id number] Date of Birth: 06/22/1951 Referring Provider (OT): Huel Coventry PA-C   Encounter Date: 08/31/2019  OT End of Session - 08/31/19 1221    Visit Number  1    Number of Visits  17    Date for OT Re-Evaluation  10/30/19    Authorization Type  Medicare/ AA/RP    Authorization - Visit Number  1    Authorization - Number of Visits  10    Progress Note Due on Visit  10    OT Start Time  0935    OT Stop Time  1013    OT Time Calculation (min)  38 min    Activity Tolerance  Patient tolerated treatment well    Behavior During Therapy  [facility] for tasks assessed/performed       Past Medical History:  Diagnosis Date  . Abnormal gait    due to parkinson's,  uses cane  . Allergic rhinitis   . Benign essential hypertension   . Chronic constipation   . Deviated septum   . GERD (gastroesophageal reflux disease)   . History of multiple concussions    per pt as teen playing football, no residual  . Hypothyroidism    followed by pcp  . Left shoulder pain   . OA (osteoarthritis)   . OSA on CPAP   . Parkinson disease [facility])    neurologist-- dr b. scott  @[facility]  . Vitamin B12 deficiency   . Vitamin D deficiency   . Wears glasses     Past Surgical History:  Procedure Laterality Date  . APPENDECTOMY  07/1964  . LAPAROSCOPIC CHOLECYSTECTOMY  01-21-2017   @[facility] , [place]  . POSTERIOR FUSION CERVICAL SPINE  08-15-2011   @[facility] , [place]   w/ laminectomy and decompression-- C3 -- T1  . POSTERIOR LAMINECTOMY / DECOMPRESSION LUMBAR SPINE  07-30-2013   @[facility] , [place]   bilateral T11 - 12,  L2 --5  . 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 SHOULDER ARTHROSCOPY WITH ROTATOR CUFF REPAIR Left 12/15/2018    Procedure: SHOULDER ARTHROSCOPY, biceps tenodesis labored Debridement, submacromial decompression;  Surgeon: Sydnee Cabal, MD;  Location: [facility];  Service: Orthopedics;  Laterality: Left;  interscalene block  . 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  Subjective Assessment - 08/31/19 0939    Pertinent History  Parkinson's Disease, hypothyroidism, hx of multiple concussions, GERD, benign essential HTN, abnormal gait, L THA 2019, removal of T1 paraspinous mass 2017, T11-12 and L2-5 lumbar lami/decompression 2015, cervical fusion C3-T1 2013, hx of shoulder surgery June 2020    Patient Stated Goals  improve coordination    Currently in Pain?  Yes    Pain Score  2     Pain Location  Finger (Comment which one)    Pain Orientation  Right    Pain Descriptors / Indicators  Aching    Pain Type  Acute pain    Pain Onset  More than a month ago    Pain Frequency  Intermittent    Aggravating Factors   bending fingers    Pain Relieving Factors  straightening fingers        [facility] OT Assessment -  09/01/19 0001      Assessment   Medical Diagnosis  Parkinson's disease    Referring Provider (OT)  Huel Coventry PA-C    Onset Date/Surgical Date  07/15/19    Hand Dominance  Left      Precautions   Precautions  Fall    Precaution Comments  hx of back and shoulder surgery, hx of RC tear, recent fall injuring right hand      Balance Screen   Has the patient fallen in the past 6 months  Yes    How many times?  1    Has the patient had a decrease in activity level because of a fear of falling?   No    Is the patient reluctant to leave their home because of a fear of falling?   No      Home  Environment   Family/patient expects to be discharged to:  Private residence    Living Arrangements  Spouse/significant other    Home Access  Level entry    [place]   Level of [place]  Enjoys playing, has been going to [facility] in the past (stopped due to [place]), enjoys playing golf       ADL   Eating/Feeding  Modified independent    Grooming  Modified independent    Upper Body Bathing  Modified independent    Lower Body Bathing  Modified independent    Upper Body Dressing  Increased time    Lower Body Dressing  Modified independent    Toilet Transfer  Modified independent    Tub/Shower Transfer  Modified independent    ADL comments  increased time required at times      IADL   Shopping  Shops independently for small purchases    Light Housekeeping  Performs light daily tasks such as dishwashing, bed making    Meal Prep  Able to complete simple cold meal and snack prep   Pt does cooking on grill   Medication Management  Is responsible for taking medication in correct dosages at correct time      Mobility   Mobility Status  Independent      Written Expression   Handwriting  Mild micrographia;100% legible      Vision - History   Baseline Vision  Wears glasses all the time      Cognition   Overall Cognitive Status  Cognition to be further assessed in functional context PRN    Memory  Impaired    Memory Impairment  Decreased short term memory      Observation/Other Assessments   Physical Performance Test    Yes    Simulated Eating Time (seconds)  11.22    Donning Doffing Jacket Time (seconds)  13.94 secs    Donning Doffing Jacket Comments  3 button/ unbutton 48.06      Posture/Postural Control   Posture/Postural Control  Postural limitations    Postural Limitations  Rounded Shoulders;Forward head      Sensation   Light Touch  Impaired by gross assessment   right hand, hx of neck surgery     Coordination   Fine Motor Movements are Fluid and Coordinated  No    9 Hole Peg Test  Right;Left    Right 9 Hole Peg Test  53.87    Left 9 Hole Peg Test  40.75  Box and Blocks  RUE 48, LUE 52     Coordination  right more impaired than left      AROM   Overall AROM   Deficits    Overall AROM Comments  RUE shoulder flexion 130, elbow extension -20, LUE 120, elbow extension -10, difficulty making full fist with RUE due to recent fall      Hand Function   Right Hand Grip (lbs)  48.5   hand injured in fall, difficulty making fist   Left Hand Grip (lbs)  73.8                        OT Short Term Goals - 09/01/19 1030      OT SHORT TERM GOAL #1   Title  I with PD specific HEP.    Time  4    Period  Weeks    Status  New    Target Date  10/01/19      OT SHORT TERM GOAL #2   Title  Pt will demonstrate improved fine motor coordination fro ADLs as evidenced by decreasing 9 hole peg test score to 49 secs or less for RUE.    Baseline  RUE 53.87 secs, LUE 53.87    Time  4    Period  Weeks    Status  New      OT SHORT TERM GOAL #3   Title  Pt will verbalize understanding of adapted strategies to maximize safety and I with ADLs/ IADLs.    Time  4    Period  Weeks    Status  New      OT SHORT TERM GOAL #4   Title  Pt will increase RUE grip strength by 5 lbs for increased functional use during ADLs.    Baseline  RUE 48.5, LUE 73.8    Time  4    Period  Weeks    Status  New        OT Long Term Goals - 09/01/19 1033      OT LONG TERM GOAL #1   Title  Pt will verbalize understanding of ways to prevent future PD related complications and PD community resources.    Time  8    Period  Weeks    Status  New    Target Date  10/31/19      OT LONG TERM GOAL #2   Title  Pt will demonstrate improved ease with fastening buttons as evidenced by decreasing 3 button/ unbutton score to 44 secs or less    Baseline  48.06    Time  8    Period  Weeks    Status  New      OT LONG TERM GOAL #3   Title  Pt will demonstrate ability to retrieve a lightweight object with LUE at 125 shoulder flexion    Baseline  LUE 120    Time  8    Period  Weeks    Status  New      OT  LONG TERM GOAL #4   Title  Pt will demonstrate ability to retrieve a lightweight object from overhead shelf with -15 elbow extension with RUE    Baseline  RUE -20 elbow extension    Time  8    Period  Weeks    Status  New            Plan - 09/01/19 1027    Clinical Impression Statement  Pt is 69 year old male who presents to [facility] with history of Parkinson's disease, who was seen for 6 month return PD screen in January 2021, with OT eval recommended due to decreased fine motor coordination changes in ADLs/ IADLs. Pt presents with the following deficits:decreased fine motor coordination, decreased ROM, decreased strength, decreased balance, bradykinesia, decreased functional mobility which impedes performance of ADLs/IADls. Pt can benefit from continued skilled occupational therapy to address these deficits in order to  maximize pt's safety and I with ADLs / IADLsPMH: Parkinson's Disease, hypothyroidism, hx of multiple concussions, GERD, benign essential HTN, abnormal gait, L THA 2019, removal of T1 paraspinous mass 2017, T11-12 and L2-5 lumbar lami/decompression 2015, cervical fusion C3-T1 2013, hx of shoulder surgery June 2020 with rotator cuff tear.    OT Occupational Profile and History  Problem Focused Assessment - Including review of records relating to presenting problem    Occupational performance deficits (Please refer to evaluation for details):  ADL's;IADL's;Work;Play;Leisure;Social Participation    Body Structure / Function / Physical Skills  ADL;Balance;Mobility;Strength;Flexibility;UE functional use;FMC;Coordination;Decreased knowledge of precautions;GMC;ROM;Sensation;IADL;Dexterity    Cognitive Skills  Memory    Rehab Potential  Good    Clinical Decision Making  Limited treatment options, no task modification necessary    Comorbidities Affecting Occupational Performance:  May have comorbidities impacting occupational performance    Modification or Assistance to Complete Evaluation    No modification of tasks or assist necessary to complete eval    OT Frequency  2x / week   anticipate d/c after 6 weeks   OT Duration  8 weeks    OT Treatment/Interventions  Self-care/ADL training;Therapeutic exercise;Aquatic Therapy;Ultrasound;Neuromuscular education;Manual Therapy;Therapeutic activities;Paraffin;DME and/or AE instruction;Cognitive remediation/compensation;Moist Heat;Passive range of motion;Patient/family education;Fluidtherapy    Plan  initiate coordination HEP, PWR! moves- pt hx of back and shoulder surgery    Consulted and Agree with Plan of Care  Patient       Patient will benefit from skilled therapeutic intervention in order to improve the following deficits and impairments:   Body Structure / Function / Physical Skills: ADL, Balance, Mobility, Strength, Flexibility, UE functional use, FMC, Coordination, Decreased knowledge of precautions, GMC, ROM, Sensation, IADL, Dexterity Cognitive Skills: Memory     Visit Diagnosis: Other lack of coordination - Plan: Ot plan of care cert/re-cert  Abnormal posture - Plan: Ot plan of care cert/re-cert  Muscle weakness (generalized) - Plan: Ot plan of care cert/re-cert  Stiffness of left shoulder, not elsewhere classified - Plan: Ot plan of care cert/re-cert  Other abnormalities of gait and mobility - Plan: Ot plan of care cert/re-cert  Unsteadiness on feet - Plan: Ot plan of care cert/re-cert  Other symptoms and signs involving the nervous system - Plan: Ot plan of care cert/re-cert    Problem List There are no problems to display for this patient.   Jim Carson 09/01/2019, 10:45 AM Theone Murdoch, OTR/L [phone] Phone: [phone] 10:45 AM 09/01/19 [facility] [address] [place], [place], [address] Phone: [phone]   Fax:  [phone]  Name: Jim Carson MRN: [id number] Date of Birth: 11/29/1950

## 2019-09-07 ENCOUNTER — Ambulatory Visit: Payer: Medicare Other | Admitting: Occupational Therapy

## 2019-09-07 ENCOUNTER — Ambulatory Visit: Payer: Medicare Other | Admitting: Speech Pathology

## 2019-09-07 ENCOUNTER — Encounter: Payer: Self-pay | Admitting: Occupational Therapy

## 2019-09-07 ENCOUNTER — Ambulatory Visit: Payer: Medicare Other | Admitting: Physical Therapy

## 2019-09-07 ENCOUNTER — Other Ambulatory Visit: Payer: Self-pay

## 2019-09-07 DIAGNOSIS — R2689 Other abnormalities of gait and mobility: Secondary | ICD-10-CM

## 2019-09-07 DIAGNOSIS — R2681 Unsteadiness on feet: Secondary | ICD-10-CM

## 2019-09-07 DIAGNOSIS — M6281 Muscle weakness (generalized): Secondary | ICD-10-CM

## 2019-09-07 DIAGNOSIS — M25612 Stiffness of left shoulder, not elsewhere classified: Secondary | ICD-10-CM

## 2019-09-07 DIAGNOSIS — R29818 Other symptoms and signs involving the nervous system: Secondary | ICD-10-CM

## 2019-09-07 DIAGNOSIS — R278 Other lack of coordination: Secondary | ICD-10-CM | POA: Diagnosis not present

## 2019-09-07 DIAGNOSIS — R471 Dysarthria and anarthria: Secondary | ICD-10-CM

## 2019-09-07 DIAGNOSIS — R293 Abnormal posture: Secondary | ICD-10-CM

## 2019-09-07 NOTE — Therapy (Signed)
[facility] [address] [place], [place], [address] Phone: [phone]   Fax:  [phone]  Occupational Therapy Treatment  Patient Details  Name: Jim Carson MRN: [id number] Date of Birth: [birth year] Referring Provider (OT): Huel Coventry PA-C   Encounter Date: 09/07/2019  OT End of Session - 09/07/19 0912    Visit Number  2    Number of Visits  17    Date for OT Re-Evaluation  10/30/19    Authorization Type  Medicare/ AA/RP    Authorization - Visit Number  2    Authorization - Number of Visits  10    Progress Note Due on Visit  10    OT Start Time  0850    OT Stop Time  0934    OT Time Calculation (min)  44 min    Activity Tolerance  Patient tolerated treatment well    Behavior During Therapy  [facility] for tasks assessed/performed       Past Medical History:  Diagnosis Date  . Abnormal gait    due to parkinson's,  uses cane  . Allergic rhinitis   . Benign essential hypertension   . Chronic constipation   . Deviated septum   . GERD (gastroesophageal reflux disease)   . History of multiple concussions    per pt as teen playing football, no residual  . Hypothyroidism    followed by pcp  . Left shoulder pain   . OA (osteoarthritis)   . OSA on CPAP   . Parkinson disease [facility])    neurologist-- dr b. scott  @[facility]  [facility]urology  . Vitamin B12 deficiency   . Vitamin D deficiency   . Wears glasses     Past Surgical History:  Procedure Laterality Date  . APPENDECTOMY  07/1964  . LAPAROSCOPIC CHOLECYSTECTOMY  01-21-2017   @[facility] , [place]  . POSTERIOR FUSION CERVICAL SPINE  08-15-2011   @[facility] , [place]   w/ laminectomy and decompression-- C3 -- T1  . POSTERIOR LAMINECTOMY / DECOMPRESSION LUMBAR SPINE  07-30-2013   @[facility] ,    bilateral T11 - 12,  L2 --5  . 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 SHOULDER ARTHROSCOPY WITH ROTATOR CUFF REPAIR Left 12/15/2018    Procedure: SHOULDER ARTHROSCOPY, biceps tenodesis labored Debridement, submacromial decompression;  Surgeon: Sydnee Cabal, MD;  Location: [facility];  Service: Orthopedics;  Laterality: Left;  interscalene block  . 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  Subjective Assessment - 09/07/19 0852    Subjective   Pt reports that L little finger wants to stay bent.  Pt reports that his PD meds were increased by neurologist yesterday.    Pertinent History  Parkinson's Disease, hypothyroidism, hx of multiple concussions, GERD, benign essential HTN, abnormal gait, L THA 2019, removal of T1 paraspinous mass 2017, T11-12 and L2-5 lumbar lami/decompression 2015, cervical fusion C3-T1 2013, hx of shoulder surgery June 2020    Patient Stated Goals  improve coordination    Currently in Pain?  No/denies    Pain Onset  More than a month ago                OT Education - 09/07/19 0911    Education Details  PWR! hands (basic 4); Coordination HEP with focus on large amplitude movements--see pt instructions    Person(s) Educated  Patient  Methods  Explanation;Verbal cues;Demonstration;Handout    Comprehension  Verbalized understanding;Returned demonstration       OT Short Term Goals - 09/01/19 1030      OT SHORT TERM GOAL #1   Title  I with PD specific HEP.    Time  4    Period  Weeks    Status  New    Target Date  10/01/19      OT SHORT TERM GOAL #2   Title  Pt will demonstrate improved fine motor coordination fro ADLs as evidenced by decreasing 9 hole peg test score to 49 secs or less for RUE.    Baseline  RUE 53.87 secs, LUE 53.87    Time  4    Period  Weeks    Status  New      OT SHORT TERM GOAL #3   Title  Pt will verbalize understanding of adapted strategies to maximize safety and I with ADLs/ IADLs.    Time  4    Period  Weeks    Status  New      OT SHORT TERM GOAL #4   Title  Pt will  increase RUE grip strength by 5 lbs for increased functional use during ADLs.    Baseline  RUE 48.5, LUE 73.8    Time  4    Period  Weeks    Status  New        OT Long Term Goals - 09/01/19 1033      OT LONG TERM GOAL #1   Title  Pt will verbalize understanding of ways to prevent future PD related complications and PD community resources.    Time  8    Period  Weeks    Status  New    Target Date  10/31/19      OT LONG TERM GOAL #2   Title  Pt will demonstrate improved ease with fastening buttons as evidenced by decreasing 3 button/ unbutton score to 44 secs or less    Baseline  48.06    Time  8    Period  Weeks    Status  New      OT LONG TERM GOAL #3   Title  Pt will demonstrate ability to retrieve a lightweight object with LUE at 125 shoulder flexion    Baseline  LUE 120    Time  8    Period  Weeks    Status  New      OT LONG TERM GOAL #4   Title  Pt will demonstrate ability to retrieve a lightweight object from overhead shelf with -15 elbow extension with RUE    Baseline  RUE -20 elbow extension    Time  8    Period  Weeks    Status  New            Plan - 09/07/19 0914    Clinical Impression Statement  Pt demo good understanding of coordination HEP and responds well to cueing for incr movement amplitude.    OT Occupational Profile and History  Problem Focused Assessment - Including review of records relating to presenting problem    Occupational performance deficits (Please refer to evaluation for details):  ADL's;IADL's;Work;Play;Leisure;Social Participation    Body Structure / Function / Physical Skills  ADL;Balance;Mobility;Strength;Flexibility;UE functional use;FMC;Coordination;Decreased knowledge of precautions;GMC;ROM;Sensation;IADL;Dexterity    Cognitive Skills  Memory    Rehab Potential  Good    Clinical Decision Making  Limited treatment options, no task modification necessary  Comorbidities Affecting Occupational Performance:  May have  comorbidities impacting occupational performance    Modification or Assistance to Complete Evaluation   No modification of tasks or assist necessary to complete eval    OT Frequency  2x / week   anticipate d/c after 6 weeks   OT Duration  8 weeks    OT Treatment/Interventions  Self-care/ADL training;Therapeutic exercise;Aquatic Therapy;Ultrasound;Neuromuscular education;Manual Therapy;Therapeutic activities;Paraffin;DME and/or AE instruction;Cognitive remediation/compensation;Moist Heat;Passive range of motion;Patient/family education;Fluidtherapy    Plan  PWR! moves- pt hx of back and shoulder surgery    Consulted and Agree with Plan of Care  Patient       Patient will benefit from skilled therapeutic intervention in order to improve the following deficits and impairments:   Body Structure / Function / Physical Skills: ADL, Balance, Mobility, Strength, Flexibility, UE functional use, FMC, Coordination, Decreased knowledge of precautions, GMC, ROM, Sensation, IADL, Dexterity Cognitive Skills: Memory     Visit Diagnosis: Other lack of coordination  Stiffness of left shoulder, not elsewhere classified  Other symptoms and signs involving the nervous system  Other abnormalities of gait and mobility  Abnormal posture  Unsteadiness on feet    Problem List There are no problems to display for this patient.   [facility] 09/07/2019, 9:55 AM  [facility] [address] [place], [place], [address] Phone: [phone]   Fax:  [phone]  Name: Jim Carson MRN: [id number] Date of Birth: [birth year]   Vianne Bulls, OTR/L [facility] [address] [place], [place]  [address] [phone] phone [phone] 09/07/19 9:55 AM

## 2019-09-07 NOTE — Therapy (Signed)
[facility] [address] [place], [place], [address] Phone: [phone]   Fax:  [phone]  Physical Therapy Treatment  Patient Details  Name: Jim Carson MRN: [id number] Date of Birth: 03-Feb-1951 Referring Provider (PT): Gillermo Murdoch   Encounter Date: 09/07/2019  PT End of Session - 09/07/19 0856    Visit Number  4    Number of Visits  13    Date for PT Re-Evaluation  11/21/19    Authorization Type  Medicare/[employer]-will need 10th visit progress note    PT Start Time  0801    PT Stop Time  0845    PT Time Calculation (min)  44 min    Activity Tolerance  Patient tolerated treatment well    Behavior During Therapy  [facility] for tasks assessed/performed       Past Medical History:  Diagnosis Date  . Abnormal gait    due to parkinson's,  uses cane  . Allergic rhinitis   . Benign essential hypertension   . Chronic constipation   . Deviated septum   . GERD (gastroesophageal reflux disease)   . History of multiple concussions    per pt as teen playing football, no residual  . Hypothyroidism    followed by pcp  . Left shoulder pain   . OA (osteoarthritis)   . OSA on CPAP   . Parkinson disease [facility])    neurologist-- dr b. scott  @[facility]  [facility]  . Vitamin B12 deficiency   . Vitamin D deficiency   . Wears glasses     Past Surgical History:  Procedure Laterality Date  . APPENDECTOMY  07/1964  . LAPAROSCOPIC CHOLECYSTECTOMY  01-21-2017   @[facility] , [place]  . POSTERIOR FUSION CERVICAL SPINE  08-15-2011   @[facility] , [place]   w/ laminectomy and decompression-- C3 -- T1  . POSTERIOR LAMINECTOMY / DECOMPRESSION LUMBAR SPINE  07-30-2013   @[facility] , [place]   bilateral T11 - 12,  L2 --5  . 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 SHOULDER ARTHROSCOPY WITH ROTATOR CUFF REPAIR Left 12/15/2018   Procedure: SHOULDER ARTHROSCOPY, biceps tenodesis labored Debridement, submacromial  decompression;  Surgeon: Sydnee Cabal, MD;  Location: [facility];  Service: Orthopedics;  Laterality: Left;  interscalene block  . 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  Subjective Assessment - 09/07/19 0804    Subjective  Saw my neurologist in [facility] yesterday; she increased my Sinemet-started that this morning.    Patient Stated Goals  Pt's goals for therapy are to improve my balance.    Currently in Pain?  No/denies                       [facility] Adult PT Treatment/Exercise - 09/07/19 0807      Transfers   Transfers  Sit to Stand;Stand to Sit    Sit to Stand  6: Modified independent (Device/Increase time);Without upper extremity assist;From chair/3-in-1;From bed    Stand to Sit  5: Supervision;Without upper extremity assist;To bed;To chair/3-in-1    Number of Reps  1 set;10 reps   from 20", 18" chair   Transfer Cueing  Pt with only one episode of posterior lean, needing to sit back down.    Comments  5 additional sit<>stand from low, soft surface.  Pt with good reach forward towards ground to initiate stand.  Exercises   Exercises  Knee/Hip      Knee/Hip Exercises: Standing   Terminal Knee Extension  Strengthening;Right;3 sets;10 reps   green band, varied stance positions   Theraband Level (Terminal Knee Extension)  Level 3 (Green)    Terminal Knee Extension Limitations  Cues for control of quads to avoid hyperextension    Forward Step Up  Right;Left;1 set;10 reps;Hand Hold: 2;Step Height: 6"   2 sets on RLE   Forward Step Up Limitations  Cues for increased anterior excursion of hip and quad/tibia over foot, cues to avoid hyperextension, 3 sec hold    Other Standing Knee Exercises  Sidestep R and L x 10 reps, then x 10 additional reps with green theraband resistance at hips          Balance Exercises - 09/07/19 0813      Balance Exercises: Standing   Wall Bumps  Hip;10  reps   2 sets   Heel Raises  Both;10 reps   2 sets; limitations with heel raise R >L   Toe Raise  Both;10 reps   2 sets    Other Standing Exercises  Stagger standing weightshifting at counter, x 10 reps each position-focus on weigthshfiting through hips.  Lateral weightshifting R and L, 10 reps, with cues for smooth weigthshfiting through hips, then shift and lift for more SLS stance work x 10 reps each.  Alternating single leg toe raises, 2 sets x 10 at counter.  Knee hyperextension noted with stagger stance weightshifting with RLE in posterior position.  Facing counter:  posterior>anterior step and weightshift x 10 reps each leg with UE support      At counter:  Forward/back walking along counter, cues in backwards direction for "hinge at hips" for smooth, controlled backwards walk; repeated 3 reps along counter -Forward/back marching 3 reps along counter with RUE support, cues for increased SLS time    PT Education - 09/07/19 0855    Education Details  Added to HEP for lower extremity strengthening    Person(s) Educated  Patient    Methods  Explanation;Demonstration;Handout    Comprehension  Verbalized understanding;Returned demonstration;Verbal cues required       PT Short Term Goals - 08/23/19 0901      PT SHORT TERM GOAL #1   Title  Pt will be independent with HEP to address balance, transfers, and gait.  TARGET 09/24/2019    Time  4    Period  Weeks    Status  New      PT SHORT TERM GOAL #2   Title  Pt will improve TUG score to less than or equal to 13.5 sec for decreased fall risk.    Time  4    Period  Weeks    Status  New    Target Date  09/24/19      PT SHORT TERM GOAL #3   Title  Pt will improve MiniBESTest score to at least 17/28 for decreased fall risk.    Time  4    Period  Weeks    Status  New        PT Long Term Goals - 08/23/19 0902      PT LONG TERM GOAL #1   Title  Pt will be independent with progression of HEP to address balance, gait.  TARGET  10/08/2019    Time  6    Period  Weeks    Status  New      PT LONG TERM GOAL #  2   Title  Pt will improve MiniBESTest score to at least 20/28 for decreased fall risk.    Time  6    Period  Weeks    Status  New      PT LONG TERM GOAL #3   Title  Pt will recover posterior LOB in 2 steps or less to improve balance recovery.    Time  6    Period  Weeks    Status  New      PT LONG TERM GOAL #4   Title  Pt will verbalize plans for return to community fitness upon d/c from PT.    Time  6    Period  Weeks    Status  New            Plan - 09/07/19 1056    Clinical Impression Statement  Skilled PT session focused on hip and ankle strategy work as well as posterior weightshifting correction at counter, needing UE support.  Also addressed strengthening of lower extremities, for improved control with gait.  Pt has significant R knee hyperextension with stagger stance posterior/anterior weightshifting exercise and was addressed with some standing exercises.  Pt will continue to benefit from continued skilled PT to further address balance, strength, and gait.    Personal Factors and Comorbidities  Comorbidity 3+    Comorbidities  PMH Parkinson's disease, neck surgery, shoulder surgery, back surgery    Examination-Activity Limitations  Locomotion Level;Transfers;Stairs    Examination-Participation Restrictions  Community Activity;Other   Return to [facility] exercise   Stability/Clinical Decision Making  Evolving/Moderate complexity    Rehab Potential  Fair    PT Frequency  2x / week    PT Duration  6 weeks   plus eval   PT Treatment/Interventions  ADLs/Self Care Home Management;Gait training;Stair training;Functional mobility training;Therapeutic activities;Therapeutic exercise;Balance training;Neuromuscular re-education;Patient/family education    PT Next Visit Plan  Review HEP and add resisted sidestepping and terminal knee extension; continue hip/ankle strategy work, step strategies to lessen  posterior lean and LOB    Consulted and Agree with Plan of Care  Patient       Patient will benefit from skilled therapeutic intervention in order to improve the following deficits and impairments:  Abnormal gait, Difficulty walking, Decreased balance, Decreased mobility, Postural dysfunction  Visit Diagnosis: Unsteadiness on feet  Muscle weakness (generalized)  Other abnormalities of gait and mobility     Problem List There are no problems to display for this patient.   Bradie Sangiovanni W. 09/07/2019, 11:01 AM Frazier Butt PT  [place] [address] [place], [place], [address] Phone: [phone]   Fax:  [phone]  Name: Blaiz Konrad MRN: [id number] Date of Birth: 10-Feb-1951

## 2019-09-07 NOTE — Patient Instructions (Addendum)
   PWR! Hand Exercises °  °Then, start with elbows bent and hands closed: °  °PWR! Hands: Push hands out BIG. Elbows straight, wrists up, fingers open and spread apart BIG.  °  °· PWR! Step: Touch index finger to thumb while keeping other fingers straight. Flick fingers out BIG (thumb out/straighten fingers). Repeat with other fingers. (Step your thumb to each finger). °  °  °With arms stretched out in front of you (elbows straight), perform the following: °  °· PWR! Rock:  Move wrists up and down BIG °  °· PWR! Twist: Twist palms up and down BIG °  °  °** Make each movement big and deliberate so that you feel the movement. °  °Perform at least 10 repetitions 1x/day, but perform PWR! Hands throughout the day when you are having trouble using your hands (picking up/manipulating small objects, writing, eating, typing, sewing, buttoning, etc.). °  °  °Coordination Exercises °  °Perform the following exercises for 20 minutes 1 times per day. Perform with both hand(s). Perform using big movements. °  °· Flipping Cards: Place deck of cards on the table. Flip cards over by opening your hand big to grasp and then turn your palm up big, opening hand fully to release. °· Deal cards: Hold 1/2 or whole deck in your hand. Use thumb to push card off top of deck with one big push. °· Flip card between each finger. °· Place card on tabletop.  Then flick fingers (extend fingers) powerfully to slide card off table (can have chair/box below table to catch the cards). °· Rotate ball with fingertips: Pick up with fingers/thumb and move as much as you can with each turn/movement (clockwise and counter-clockwise). °· Ball push ups:  Hold ball in fingertips then bringing ball toward palm and then straightening fingers °· Toss ball from one hand to the other: Toss big/high.  Deliberately open with toss and deliberately close hand after catch. °· Toss ball in the air and catch with the same hand: Toss big/high.  Deliberately open with toss  and deliberately close hand after catch. °· Rotate 2 golf balls in your hand: Both directions. °· Pick up coins and stack one at a time: Pick up with big, intentional movements. Do not drag coin to the edge. (5-10 in a stack) °· Pick up 5-10 coins one at a time and hold in palm. Then, move coins from palm to fingertips one at time and place in coin bank/container. °· Practice writing: Slow down, write big, and focus on forming each letter. °

## 2019-09-07 NOTE — Patient Instructions (Signed)
Access Code: [email or web address] URL: [email or web address] Date: 09/07/2019 Prepared by: Mady Haagensen  Exercises Alternating Heel Raises - 10 reps - 2 sets - 1x daily - 5x weekly Forward Step Up - 10 reps - 2 sets - 3 sec hold - 1x daily - 5x weekly

## 2019-09-07 NOTE — Patient Instructions (Signed)
When you do your loud "ahhhh!" it should not feel scratchy or sore. Practice what we tried today, "do it from your diaphragm." You can try loud "Haaaaaahhhhh" to help you start with a gentler onset.

## 2019-09-07 NOTE — Therapy (Signed)
[facility] [address] [place], [place], [address] Phone: [phone]   Fax:  [phone]  Speech Language Pathology Treatment  Patient Details  Name: Jim Carson MRN: [id number] Date of Birth: August 28, 1950 Referring Provider (SLP): Modena Nunnery., PA   Encounter Date: 09/07/2019  End of Session - 09/07/19 1655    Visit Number  2    Number of Visits  17    Date for SLP Re-Evaluation  11/29/19   90 days   SLP Start Time  0935    SLP Stop Time   1015    SLP Time Calculation (min)  40 min    Activity Tolerance  Patient tolerated treatment well       Past Medical History:  Diagnosis Date  . Abnormal gait    due to parkinson's,  uses cane  . Allergic rhinitis   . Benign essential hypertension   . Chronic constipation   . Deviated septum   . GERD (gastroesophageal reflux disease)   . History of multiple concussions    per pt as teen playing football, no residual  . Hypothyroidism    followed by pcp  . Left shoulder pain   . OA (osteoarthritis)   . OSA on CPAP   . Parkinson disease [facility])    neurologist-- dr b. scott  @[facility]  . Vitamin B12 deficiency   . Vitamin D deficiency   . Wears glasses     Past Surgical History:  Procedure Laterality Date  . APPENDECTOMY  07/1964  . LAPAROSCOPIC CHOLECYSTECTOMY  01-21-2017   @[facility] , [place]  . POSTERIOR FUSION CERVICAL SPINE  08-15-2011   @[facility] , [place]   w/ laminectomy and decompression-- C3 -- T1  . POSTERIOR LAMINECTOMY / DECOMPRESSION LUMBAR SPINE  07-30-2013   @[facility] , [place]   bilateral T11 - 12,  L2 --5  . 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 SHOULDER ARTHROSCOPY WITH ROTATOR CUFF REPAIR Left 12/15/2018   Procedure: SHOULDER ARTHROSCOPY, biceps tenodesis labored Debridement, submacromial decompression;  Surgeon: Sydnee Cabal, MD;  Location: [facility];  Service: Orthopedics;   Laterality: Left;  interscalene block  . 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  Subjective Assessment - 09/07/19 0940    Subjective  "Usually I'm in the car when I do it." re: /a/    Currently in Pain?  No/denies            ADULT SLP TREATMENT - 09/07/19 0941      General Information   Behavior/Cognition  Alert;Cooperative      Treatment Provided   Treatment provided  Cognitive-Linquistic      Pain Assessment   Pain Assessment  No/denies pain      Cognitive-Linquistic Treatment   Treatment focused on  Dysarthria;Patient/family/caregiver education    Skilled Treatment  Patient with observable neck/shoulder tension and laryngeal push with initial loud /a/ today. Provided moderate cues (tactile, verbal) for shaping of loud /a/ for improved abdominal recruitment, gentler onset vs glottal attack (/ha/). Average 91 dB with mod cues. Pt read everyday phrases average 75dB at 30 cm. Progressed to sentence level tasks 74 db with occasional min cues for fading intensity at end of utterance. With increased cognitive load average for sentence tasks was 72 dB with occasional min verbal and visual cues. Carryover in structured conversation (5 minutes) low 70s  dB with occasional cues. Some generalization to spontaneous responses today, which ranged from 60 dB to 75 dB.       Assessment / Recommendations / Plan   Plan  Continue with current plan of care      Progression Toward Goals   Progression toward goals  Progressing toward goals       SLP Education - 09/07/19 1655    Education Details  /ha/ for gentler onset    Person(s) Educated  Patient    Methods  Explanation;Verbal cues    Comprehension  Verbalized understanding;Returned demonstration       SLP Short Term Goals - 09/07/19 0941      SLP SHORT TERM GOAL #1   Title  pt to maintain loud /a/ for at least 10 seconds with upper 80s -low 90s dB in 5 therapy sessions     Time  4    Period  Weeks    Status  On-going      SLP SHORT TERM GOAL #2   Title  pt will produce WNLspeech volume in 10 minutes simple conversation in 3 sessions    Time  4    Period  Weeks    Status  On-going      SLP SHORT TERM GOAL #3   Title  pt will demo abdomoinal breathing in sentence responses 75% of the time in 3 sessions    Time  4    Period  Weeks    Status  On-going       SLP Long Term Goals - 09/07/19 1012      SLP LONG TERM GOAL #1   Title  pt will demo WNL speech volume in 10 minutes mod complex-complex conversation over 3 sessions    Time  8    Period  Weeks   or 17 sessions, for all LTGs   Status  On-going      SLP LONG TERM GOAL #2   Title  pt will demo abdominal breathing75% of the time in 10 minutes simple conversation in 3 sessions    Time  8    Period  Weeks    Status  On-going      SLP LONG TERM GOAL #3   Title  pt will maintain loud /a at low 90s dB average in 4 sessions    Time  8    Period  Weeks    Status  On-going       Plan - 09/07/19 1656    Clinical Impression Statement  Pt presents with suboptimal speech volume in conversation - has had LOUD before but has "forgotten" how to speak in WNL volume. Required mod cues for shaping loud /a/ today. After loud /a/, pt was much better at maintaining WNL volume - in 5 minutes simple conversation his volume was low 70s dB. Prior to this it was in mid-upper 60s in 5 minutes simple conversation. Pt will benefit from skilled ST targeting WNL volume in more complex conversation.    Speech Therapy Frequency  2x / week    Duration  --   8 weeks or 17 total sessions   Treatment/Interventions  Functional tasks;SLP instruction and feedback;Compensatory strategies;Patient/family education;Internal/external aids;Cueing hierarchy;Environmental controls    Potential to Achieve Goals  Good    SLP Home Exercise Plan  provided today    Consulted and Agree with Plan of Care  Patient       Patient will  benefit from skilled therapeutic intervention in order to improve  the following deficits and impairments:   Dysarthria and anarthria    Problem List There are no problems to display for this patient.  Deneise Lever, [place], [place] Speech-Language Pathologist  Aliene Altes 09/07/2019, 4:57 PM  [facility] [address] [place], [place], [address] Phone: [phone]   Fax:  [phone]   Name: Jim Carson MRN: [id number] Date of Birth: 01/13/1951

## 2019-09-09 ENCOUNTER — Other Ambulatory Visit: Payer: Self-pay

## 2019-09-09 ENCOUNTER — Ambulatory Visit: Payer: Medicare Other | Admitting: Physical Therapy

## 2019-09-09 ENCOUNTER — Encounter: Payer: Self-pay | Admitting: Physical Therapy

## 2019-09-09 ENCOUNTER — Ambulatory Visit: Payer: Medicare Other

## 2019-09-09 ENCOUNTER — Ambulatory Visit: Payer: Medicare Other | Admitting: Occupational Therapy

## 2019-09-09 DIAGNOSIS — R278 Other lack of coordination: Secondary | ICD-10-CM

## 2019-09-09 DIAGNOSIS — M6281 Muscle weakness (generalized): Secondary | ICD-10-CM

## 2019-09-09 DIAGNOSIS — M25612 Stiffness of left shoulder, not elsewhere classified: Secondary | ICD-10-CM

## 2019-09-09 DIAGNOSIS — R293 Abnormal posture: Secondary | ICD-10-CM

## 2019-09-09 DIAGNOSIS — R29818 Other symptoms and signs involving the nervous system: Secondary | ICD-10-CM

## 2019-09-09 DIAGNOSIS — R471 Dysarthria and anarthria: Secondary | ICD-10-CM

## 2019-09-09 DIAGNOSIS — R2681 Unsteadiness on feet: Secondary | ICD-10-CM

## 2019-09-09 DIAGNOSIS — R2689 Other abnormalities of gait and mobility: Secondary | ICD-10-CM

## 2019-09-09 NOTE — Therapy (Signed)
[facility] [address] [place], [place], [address] Phone: [phone]   Fax:  [phone]  Speech Language Pathology Treatment  Patient Details  Name: Jim Carson MRN: [id number] Date of Birth: April 24, 1951 Referring Provider (SLP): Modena Nunnery., PA   Encounter Date: 09/09/2019  End of Session - 09/09/19 1015    Visit Number  3    Number of Visits  17    Date for SLP Re-Evaluation  11/29/19    SLP Start Time  0933    SLP Stop Time   [id number]    SLP Time Calculation (min)  42 min    Activity Tolerance  Patient tolerated treatment well       Past Medical History:  Diagnosis Date  . Abnormal gait    due to parkinson's,  uses cane  . Allergic rhinitis   . Benign essential hypertension   . Chronic constipation   . Deviated septum   . GERD (gastroesophageal reflux disease)   . History of multiple concussions    per pt as teen playing football, no residual  . Hypothyroidism    followed by pcp  . Left shoulder pain   . OA (osteoarthritis)   . OSA on CPAP   . Parkinson disease [facility])    neurologist-- dr b. scott  @[facility]  . Vitamin B12 deficiency   . Vitamin D deficiency   . Wears glasses     Past Surgical History:  Procedure Laterality Date  . APPENDECTOMY  07/1964  . LAPAROSCOPIC CHOLECYSTECTOMY  01-21-2017   @[facility] , [place]  . POSTERIOR FUSION CERVICAL SPINE  08-15-2011   @[facility] , [place]   w/ laminectomy and decompression-- C3 -- T1  . POSTERIOR LAMINECTOMY / DECOMPRESSION LUMBAR SPINE  07-30-2013   @[facility] , [place]   bilateral T11 - 12,  L2 --5  . 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 SHOULDER ARTHROSCOPY WITH ROTATOR CUFF REPAIR Left 12/15/2018   Procedure: SHOULDER ARTHROSCOPY, biceps tenodesis labored Debridement, submacromial decompression;  Surgeon: Sydnee Cabal, MD;  Location: [facility];  Service: Orthopedics;  Laterality:  Left;  interscalene block  . 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  Subjective Assessment - 09/09/19 0937    Subjective  Pt brought in his everyday sentences.    Currently in Pain?  No/denies   Woke up with stiff back but it's beter now after exercise           ADULT SLP TREATMENT - 09/09/19 0949      General Information   Behavior/Cognition  Alert;Cooperative;Pleasant mood      Treatment Provided   Treatment provided  Cognitive-Linquistic;Dysphagia      Dysphagia Treatment   Other treatment/comments  SLP provided pt with additional exercises as pt is now doing CTAR approx 15-20 reps/day - SLP told to do 25-30 reps/day of all exercises. SLP educated pt on Mendelsohn and effortful swallow - pt performed with rare min A by end of session.        Cognitive-Linquistic Treatment   Treatment focused on  Patient/family/caregiver education;Dysarthria    Skilled Treatment  SLP had pt complete loud /a/, hi-lo, lo-hi pitch , and everyday sentences to habitualize louder speech in conversation as well as maintain WNL intonation in conversation. Patient with observable neck/shoulder tension and laryngeal push with /a/ and with pitch glides. SLP provided  moderate cues (tactile, verbal) initilly and pt immedately successful in correcting - relaxed shoulders observed. Average low 90s dB with loud /a/. Pt read everyday phrases average mid 70s dB at 30 cm. Pt stated "feels like I'm shouting" after 3 minutes converstion at WNL volume so SLP recorded pt talking for 60 seconds and veriifed to pt he was NOT shouting as he listened back to the recording. Carryover in conversation (10 minutes) low 70s dB with occasional min verbal and nonverbal cues. .       Assessment / Recommendations / Plan   Plan  Continue with current plan of care         SLP Short Term Goals - 09/09/19 1129      SLP SHORT TERM GOAL #1   Title  pt to maintain loud  /a/ for at least 10 seconds with upper 80s -low 90s dB in 5 therapy sessions    Baseline  09-09-19    Time  4    Period  Weeks    Status  On-going      SLP SHORT TERM GOAL #2   Title  pt will produce WNL speech volume in 10 minutes simple conversation in 3 sessions    Time  4    Period  Weeks    Status  On-going      SLP SHORT TERM GOAL #3   Title  pt will demo abdomoinal breathing in sentence responses 75% of the time in 3 sessions    Time  4    Period  Weeks    Status  On-going       SLP Long Term Goals - 09/09/19 1130      SLP LONG TERM GOAL #1   Title  pt will demo WNL speech volume in 10 minutes mod complex-complex conversation over 3 sessions    Time  8    Period  Weeks   or 17 sessions, for all LTGs   Status  On-going      SLP LONG TERM GOAL #2   Title  pt will demo abdominal breathing75% of the time in 10 minutes simple conversation in 3 sessions    Time  8    Period  Weeks    Status  On-going      SLP LONG TERM GOAL #3   Title  pt will maintain loud /a at low 90s dB average in 4 sessions    Time  8    Period  Weeks    Status  On-going       Plan - 09/09/19 1124    Clinical Impression Statement  Pt presents with suboptimal speech volume (mid-upper 60s dB in conversation) prior to loud /a/.  After loud /a/, pt was much better at maintaining WNL volume - in simple conversation his volume was low 70s dB with occasional min A for engaging core muscles and louder speech. Pt will cont to benefit from skilled ST targeting WNL volume in more complex conversation.    Speech Therapy Frequency  2x / week    Duration  --   8 weeks or 17 total sessions   Treatment/Interventions  Functional tasks;SLP instruction and feedback;Compensatory strategies;Patient/family education;Internal/external aids;Cueing hierarchy;Environmental controls    Potential to Achieve Goals  Good    SLP Home Exercise Plan  provided today    Consulted and Agree with Plan of Care  Patient        Patient will benefit from skilled therapeutic intervention in order to improve  the following deficits and impairments:   Dysarthria and anarthria    Problem List There are no problems to display for this patient.   [facility] ,[place], [place]  09/09/2019, 11:30 AM  [facility] [address] [place], [place], [address] Phone: [phone]   Fax:  [phone]   Name: Jim Carson MRN: [id number] Date of Birth: 1951-02-02

## 2019-09-09 NOTE — Therapy (Signed)
[facility] [address] [place], [place], [address] Phone: [phone]   Fax:  [phone]  Physical Therapy Treatment  Patient Details  Name: Jim Carson MRN: [id number] Date of Birth: September 01, 1950 Referring Provider (PT): Gillermo Murdoch   Encounter Date: 09/09/2019  PT End of Session - 09/09/19 0949    Visit Number  5    Number of Visits  13    Date for PT Re-Evaluation  11/21/19    Authorization Type  Medicare/[employer]-will need 10th visit progress note    PT Start Time  0845    PT Stop Time  0930    PT Time Calculation (min)  45 min    Activity Tolerance  Patient tolerated treatment well    Behavior During Therapy  [facility] for tasks assessed/performed       Past Medical History:  Diagnosis Date  . Abnormal gait    due to parkinson's,  uses cane  . Allergic rhinitis   . Benign essential hypertension   . Chronic constipation   . Deviated septum   . GERD (gastroesophageal reflux disease)   . History of multiple concussions    per pt as teen playing football, no residual  . Hypothyroidism    followed by pcp  . Left shoulder pain   . OA (osteoarthritis)   . OSA on CPAP   . Parkinson disease [facility])    neurologist-- dr b. scott  @[facility]  . Vitamin B12 deficiency   . Vitamin D deficiency   . Wears glasses     Past Surgical History:  Procedure Laterality Date  . APPENDECTOMY  07/1964  . LAPAROSCOPIC CHOLECYSTECTOMY  01-21-2017   @[facility] , [place]  . POSTERIOR FUSION CERVICAL SPINE  08-15-2011   @[facility] , [place]   w/ laminectomy and decompression-- C3 -- T1  . POSTERIOR LAMINECTOMY / DECOMPRESSION LUMBAR SPINE  07-30-2013   @[facility] , [place]   bilateral T11 - 12,  L2 --5  . 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 SHOULDER ARTHROSCOPY WITH ROTATOR CUFF REPAIR Left 12/15/2018   Procedure: SHOULDER ARTHROSCOPY, biceps tenodesis labored Debridement, submacromial  decompression;  Surgeon: Sydnee Cabal, MD;  Location: [facility];  Service: Orthopedics;  Laterality: Left;  interscalene block  . 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  Subjective Assessment - 09/09/19 0849    Subjective  Denies falls or changes since last visit.    Patient Stated Goals  Pt's goals for therapy are to improve my balance.    Currently in Pain?  No/denies         [facility] Adult PT Treatment/Exercise - 09/09/19 0001      Transfers   Transfers  Sit to Stand;Stand to Sit    Sit to Stand  6: Modified independent (Device/Increase time);Without upper extremity assist;From chair/3-in-1;From bed    Stand to Sit  6: Modified independent (Device/Increase time)    Number of Reps  Other reps (comment)   through out session between activities     Ambulation/Gait   Ambulation/Gait  Yes    Ambulation/Gait Assistance  5: Supervision    Ambulation Distance (Feet)  --   through out gym with activities   Assistive device  Straight cane    Gait Pattern  Step-through pattern;Decreased step length - right;Decreased step length - left;Decreased hip/knee flexion - right;Decreased hip/knee flexion - left;Decreased  dorsiflexion - right;Festinating;Decreased trunk rotation    Ambulation Surface  Level;Indoor      Posture/Postural Control   Posture/Postural Control  Postural limitations    Postural Limitations  Rounded Shoulders;Forward head      Exercises   Exercises  Knee/Hip;Ankle      Knee/Hip Exercises: Aerobic   Other Aerobic  Scifit level 3.0 all 4 extremities x 8 minutes with rpm>55 for strenghtrning and flexibility      Knee/Hip Exercises: Standing   Terminal Knee Extension  Strengthening;Right;3 sets;10 reps    Theraband Level (Terminal Knee Extension)  Level 3 (Green)    Terminal Knee Extension Limitations  Cues for control of quads to avoid hyperextension    Forward Step Up  Right;Left;1  set;10 reps;Hand Hold: 2;Step Height: 6"    Forward Step Up Limitations  Cues for increased anterior excursion of hip and quad/tibia over foot, cues to avoid hyperextension, 3 sec hold    Step Down  Right;Left;1 set;10 reps;Hand Hold: 2;Step Height: 6"    Step Down Limitations  cues to avoid R knee hyperextension    Other Standing Knee Exercises  side step to R and L x 15 reps with green theraband then side step walk x 8' x 6 in // bars with green band around knees and UE support      Ankle Exercises: Standing   Heel Raises  Both;15 reps;2 seconds;Limitations   decreased on R   Toe Raise  15 reps;2 seconds;Limitations    Toe Raise Limitations  cues to avoid leaning backward with hips and to remain upright          Balance Exercises - 09/09/19 0943      Balance Exercises: Standing   Other Standing Exercises  Stagger standing weightshifting in // bars x 15 reps each position-focus on weight shfiting through hips.  Lateral weight shifting R and L, 15 reps, with cues for smooth weigth shfiting through hips.  Knee hyperextension noted with stagger stance weightshifting with RLE in posterior position.  Posterior>anterior step and weightshift x 10 reps each leg with UE support.  Marching in // bars x 8' x 8 reps working on increased hip flexion without leaning backward.  Pt tending to use momentum and swing backward to lift LE.  Discussed trunk/core stability and pt able to perform without using momentum with cues.       PT Education - 09/09/19 0949    Education Details  additions to HEP    Person(s) Educated  Patient    Methods  Explanation;Demonstration;Handout    Comprehension  Verbalized understanding       PT Short Term Goals - 08/23/19 0901      PT SHORT TERM GOAL #1   Title  Pt will be independent with HEP to address balance, transfers, and gait.  TARGET 09/24/2019    Time  4    Period  Weeks    Status  New      PT SHORT TERM GOAL #2   Title  Pt will improve TUG score to less  than or equal to 13.5 sec for decreased fall risk.    Time  4    Period  Weeks    Status  New    Target Date  09/24/19      PT SHORT TERM GOAL #3   Title  Pt will improve MiniBESTest score to at least 17/28 for decreased fall risk.    Time  4    Period  Weeks  Status  New        PT Long Term Goals - 08/23/19 0902      PT LONG TERM GOAL #1   Title  Pt will be independent with progression of HEP to address balance, gait.  TARGET 10/08/2019    Time  6    Period  Weeks    Status  New      PT LONG TERM GOAL #2   Title  Pt will improve MiniBESTest score to at least 20/28 for decreased fall risk.    Time  6    Period  Weeks    Status  New      PT LONG TERM GOAL #3   Title  Pt will recover posterior LOB in 2 steps or less to improve balance recovery.    Time  6    Period  Weeks    Status  New      PT LONG TERM GOAL #4   Title  Pt will verbalize plans for return to community fitness upon d/c from PT.    Time  6    Period  Weeks    Status  New            Plan - 09/09/19 0950    Clinical Impression Statement  Skilled session continues to focus on weightshifting correction needing UE support.  Also addressed strengthening of lower extremities, for improved control with gait.  Pt continues with R knee hyperextension with stagger stance posterior/anterior weightshifting exercise and stairs and added to HEP to address.  Continue PT per POC.    Personal Factors and Comorbidities  Comorbidity 3+    Comorbidities  PMH Parkinson's disease, neck surgery, shoulder surgery, back surgery    Examination-Activity Limitations  Locomotion Level;Transfers;Stairs    Examination-Participation Restrictions  Community Activity;Other   Return to [facility] exercise   Stability/Clinical Decision Making  Evolving/Moderate complexity    Rehab Potential  Fair    PT Frequency  2x / week    PT Duration  6 weeks   plus eval   PT Treatment/Interventions  ADLs/Self Care Home Management;Gait  training;Stair training;Functional mobility training;Therapeutic activities;Therapeutic exercise;Balance training;Neuromuscular re-education;Patient/family education    PT Next Visit Plan  continue hip/ankle strategy work, step strategies to lessen posterior lean and LOB.  Core strengthening.    PT Home Exercise Plan  Access Code: [email or web address]    Consulted and Agree with Plan of Care  Patient       Patient will benefit from skilled therapeutic intervention in order to improve the following deficits and impairments:  Abnormal gait, Difficulty walking, Decreased balance, Decreased mobility, Postural dysfunction  Visit Diagnosis: Unsteadiness on feet  Muscle weakness (generalized)  Other abnormalities of gait and mobility     Problem List There are no problems to display for this patient.   Narda Bonds, [place] 09/09/19 10:01 AM Phone: [phone] Fax: [facility] [address] [place], [place], [address] Phone: [phone]   Fax:  [phone]  Name: Kaemon Sandefer MRN: [id number] Date of Birth: 22-Sep-1950

## 2019-09-09 NOTE — Therapy (Signed)
[facility] [address] [place], [place], [address] Phone: [phone]   Fax:  [phone]  Occupational Therapy Treatment  Patient Details  Name: Jim Carson MRN: [id number] Date of Birth: 01/23/1951 Referring Provider (OT): Huel Coventry PA-C   Encounter Date: 09/09/2019  OT End of Session - 09/09/19 0825    Visit Number  3    Number of Visits  17    Date for OT Re-Evaluation  10/30/19    Authorization Type  Medicare/ AA/RP    Authorization - Visit Number  3    Authorization - Number of Visits  10    Progress Note Due on Visit  10    OT Start Time  0804    OT Stop Time  0845    OT Time Calculation (min)  41 min    Activity Tolerance  Patient tolerated treatment well    Behavior During Therapy  [facility] for tasks assessed/performed       Past Medical History:  Diagnosis Date  . Abnormal gait    due to parkinson's,  uses cane  . Allergic rhinitis   . Benign essential hypertension   . Chronic constipation   . Deviated septum   . GERD (gastroesophageal reflux disease)   . History of multiple concussions    per pt as teen playing football, no residual  . Hypothyroidism    followed by pcp  . Left shoulder pain   . OA (osteoarthritis)   . OSA on CPAP   . Parkinson disease [facility])    neurologist-- dr b. scott  @[facility]  . Vitamin B12 deficiency   . Vitamin D deficiency   . Wears glasses     Past Surgical History:  Procedure Laterality Date  . APPENDECTOMY  07/1964  . LAPAROSCOPIC CHOLECYSTECTOMY  01-21-2017   @[facility] , [place]  . POSTERIOR FUSION CERVICAL SPINE  08-15-2011   @[facility] , [place]   w/ laminectomy and decompression-- C3 -- T1  . POSTERIOR LAMINECTOMY / DECOMPRESSION LUMBAR SPINE  07-30-2013   @[facility] , [place]   bilateral T11 - 12,  L2 --5  . 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 SHOULDER ARTHROSCOPY WITH ROTATOR CUFF REPAIR Left 12/15/2018    Procedure: SHOULDER ARTHROSCOPY, biceps tenodesis labored Debridement, submacromial decompression;  Surgeon: Sydnee Cabal, MD;  Location: [facility];  Service: Orthopedics;  Laterality: Left;  interscalene block  . 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  Subjective Assessment - 09/09/19 0804    Subjective   Pt reports back pain    Pertinent History  Parkinson's Disease, hypothyroidism, hx of multiple concussions, GERD, benign essential HTN, abnormal gait, L THA 2019, removal of T1 paraspinous mass 2017, T11-12 and L2-5 lumbar lami/decompression 2015, cervical fusion C3-T1 2013, hx of shoulder surgery June 2020    Patient Stated Goals  improve coordination    Currently in Pain?  Yes    Pain Score  3     Pain Location  Back    Pain Descriptors / Indicators  Aching    Pain Type  Chronic pain    Pain Onset  More than a month ago    Pain Frequency  Intermittent    Aggravating Factors   laying flat    Pain Relieving Factors  repositioning    Multiple Pain Sites  No  Treatment: Arm bike x 5 mins level 1 for conditioning, pt maintained greater than 40 rpm. Grooved pegboard for increased fine motor coordination and in hand manipulation RUE, min v.c to avoid compensation                OT Treatment.\/  Education - 09/09/19 0828    Education Details  green putty for RUE grip and pinch, PWR! seated basic 4 10-20 reps, min v.c, PWR !up and rock, modified quadraped 10 reps each min v.c for larger amplitude movements.    Person(s) Educated  Patient    Methods  Explanation;Verbal cues;Demonstration;Handout    Comprehension  Verbalized understanding;Returned demonstration;Verbal cues required       OT Short Term Goals - 09/09/19 0827      OT SHORT TERM GOAL #1   Title  I with PD specific HEP.    Time  4    Period  Weeks    Status  New    Target Date  10/01/19      OT SHORT TERM  GOAL #2   Title  Pt will demonstrate improved fine motor coordination fro ADLs as evidenced by decreasing 9 hole peg test score to 49 secs or less for RUE.    Baseline  RUE 53.87 secs, LUE 53.87    Time  4    Period  Weeks    Status  New      OT SHORT TERM GOAL #3   Title  Pt will verbalize understanding of adapted strategies to maximize safety and I with ADLs/ IADLs.    Time  4    Period  Weeks    Status  New      OT SHORT TERM GOAL #4   Title  Pt will increase RUE grip strength by 5 lbs for increased functional use during ADLs.    Baseline  RUE 48.5, LUE 73.8    Time  4    Period  Weeks    Status  New        OT Long Term Goals - 09/01/19 1033      OT LONG TERM GOAL #1   Title  Pt will verbalize understanding of ways to prevent future PD related complications and PD community resources.    Time  8    Period  Weeks    Status  New    Target Date  10/31/19      OT LONG TERM GOAL #2   Title  Pt will demonstrate improved ease with fastening buttons as evidenced by decreasing 3 button/ unbutton score to 44 secs or less    Baseline  48.06    Time  8    Period  Weeks    Status  New      OT LONG TERM GOAL #3   Title  Pt will demonstrate ability to retrieve a lightweight object with LUE at 125 shoulder flexion    Baseline  LUE 120    Time  8    Period  Weeks    Status  New      OT LONG TERM GOAL #4   Title  Pt will demonstrate ability to retrieve a lightweight object from overhead shelf with -15 elbow extension with RUE    Baseline  RUE -20 elbow extension    Time  8    Period  Weeks    Status  New            Plan - 09/09/19 [id number]  Clinical Impression Statement  Pt is progressing towards goals. He demonstrates understanding of updates to HEP.    OT Occupational Profile and History  Problem Focused Assessment - Including review of records relating to presenting problem    Occupational performance deficits (Please refer to evaluation for details):   ADL's;IADL's;Work;Play;Leisure;Social Participation    Body Structure / Function / Physical Skills  ADL;Balance;Mobility;Strength;Flexibility;UE functional use;FMC;Coordination;Decreased knowledge of precautions;GMC;ROM;Sensation;IADL;Dexterity    Cognitive Skills  Memory    Rehab Potential  Good    Clinical Decision Making  Limited treatment options, no task modification necessary    Comorbidities Affecting Occupational Performance:  May have comorbidities impacting occupational performance    Modification or Assistance to Complete Evaluation   No modification of tasks or assist necessary to complete eval    OT Frequency  2x / week   anticipate d/c after 6 weeks   OT Duration  8 weeks    OT Treatment/Interventions  Self-care/ADL training;Therapeutic exercise;Aquatic Therapy;Ultrasound;Neuromuscular education;Manual Therapy;Therapeutic activities;Paraffin;DME and/or AE instruction;Cognitive remediation/compensation;Moist Heat;Passive range of motion;Patient/family education;Fluidtherapy    Plan  continue with PWR! moves-( pt hx of back and shoulder surgery)    OT Home Exercise Plan  issued PWR! seated and modified quadraped for PWR! up and rock, green putty    Consulted and Agree with Plan of Care  Patient       Patient will benefit from skilled therapeutic intervention in order to improve the following deficits and impairments:   Body Structure / Function / Physical Skills: ADL, Balance, Mobility, Strength, Flexibility, UE functional use, FMC, Coordination, Decreased knowledge of precautions, GMC, ROM, Sensation, IADL, Dexterity Cognitive Skills: Memory     Visit Diagnosis: Other lack of coordination  Stiffness of left shoulder, not elsewhere classified  Other symptoms and signs involving the nervous system  Abnormal posture  Muscle weakness (generalized)    Problem List There are no problems to display for this patient.   Genowefa Morga 09/09/2019, 9:02 AM  [facility] [address] [place], [place], [address] Phone: [phone]   Fax:  [phone]  Name: Jim Carson MRN: [id number] Date of Birth: 16-Jan-1951

## 2019-09-09 NOTE — Patient Instructions (Signed)
1. Grip Strengthening (Resistive Putty)   Squeeze putty using thumb and all fingers. Repeat _20___ times. Do __2__ sessions per day.   2. Roll putty into tube on table and pinch between each finger and thumb x 10 reps each. (can do ring and small finger together)     Copyright  VHI. All rights reserved.   

## 2019-09-09 NOTE — Patient Instructions (Signed)
Access Code: [email or web address]  URL: [email or web address]  Date: 09/09/2019  Prepared by: Nita Sells   Exercises  Alternating Heel Raises - 10 reps - 2 sets - 1x daily - 5x weekly  Forward Step Up - 10 reps - 2 sets - 3 sec hold - 1x daily - 5x weekly  Side Stepping with Resistance at Thighs - 10 reps - 2 sets - 1x daily - 5x weekly  Standing Terminal Knee Extension with Resistance - 10 reps - 2 sets - 1x daily - 5x weekly

## 2019-09-09 NOTE — Patient Instructions (Signed)
   In addition to the fist on chin exercise, do these as well.  Swallow and hold everything tight for 5 seconds - 25-30 reps/day  Swallow like your're swallowing a golf ball - 25-30 reps/day

## 2019-09-14 ENCOUNTER — Ambulatory Visit: Payer: Medicare Other | Admitting: Physical Therapy

## 2019-09-14 ENCOUNTER — Other Ambulatory Visit: Payer: Self-pay

## 2019-09-14 ENCOUNTER — Ambulatory Visit: Payer: Medicare Other

## 2019-09-14 ENCOUNTER — Encounter: Payer: Self-pay | Admitting: Occupational Therapy

## 2019-09-14 ENCOUNTER — Ambulatory Visit: Payer: Medicare Other | Admitting: Occupational Therapy

## 2019-09-14 DIAGNOSIS — R278 Other lack of coordination: Secondary | ICD-10-CM | POA: Diagnosis not present

## 2019-09-14 DIAGNOSIS — R29818 Other symptoms and signs involving the nervous system: Secondary | ICD-10-CM

## 2019-09-14 DIAGNOSIS — M6281 Muscle weakness (generalized): Secondary | ICD-10-CM

## 2019-09-14 DIAGNOSIS — M25612 Stiffness of left shoulder, not elsewhere classified: Secondary | ICD-10-CM

## 2019-09-14 DIAGNOSIS — R293 Abnormal posture: Secondary | ICD-10-CM

## 2019-09-14 DIAGNOSIS — R471 Dysarthria and anarthria: Secondary | ICD-10-CM

## 2019-09-14 DIAGNOSIS — R1312 Dysphagia, oropharyngeal phase: Secondary | ICD-10-CM

## 2019-09-14 DIAGNOSIS — R2681 Unsteadiness on feet: Secondary | ICD-10-CM

## 2019-09-14 DIAGNOSIS — R2689 Other abnormalities of gait and mobility: Secondary | ICD-10-CM

## 2019-09-14 NOTE — Therapy (Signed)
[facility] [address] [place], [place], [address] Phone: [phone]   Fax:  [phone]  Occupational Therapy Treatment  Patient Details  Name: Jim Carson MRN: [id number] Date of Birth: [birth year] Referring Provider (OT): Huel Coventry PA-C   Encounter Date: 09/14/2019  OT End of Session - 09/14/19 0934    Visit Number  4    Number of Visits  17    Date for OT Re-Evaluation  10/30/19    Authorization Type  Medicare/ AA/RP    Authorization - Visit Number  4    Authorization - Number of Visits  10    Progress Note Due on Visit  10    OT Start Time  0934    OT Stop Time  1015    OT Time Calculation (min)  41 min    Activity Tolerance  Patient tolerated treatment well    Behavior During Therapy  [facility] for tasks assessed/performed       Past Medical History:  Diagnosis Date  . Abnormal gait    due to parkinson's,  uses cane  . Allergic rhinitis   . Benign essential hypertension   . Chronic constipation   . Deviated septum   . GERD (gastroesophageal reflux disease)   . History of multiple concussions    per pt as teen playing football, no residual  . Hypothyroidism    followed by pcp  . Left shoulder pain   . OA (osteoarthritis)   . OSA on CPAP   . Parkinson disease [facility])    neurologist-- dr b. scott  @[facility]  [facility]  . Vitamin B12 deficiency   . Vitamin D deficiency   . Wears glasses     Past Surgical History:  Procedure Laterality Date  . APPENDECTOMY  07/1964  . LAPAROSCOPIC CHOLECYSTECTOMY  01-21-2017   @[facility] , [place]  . POSTERIOR FUSION CERVICAL SPINE  08-15-2011   @[facility] , [place]   w/ laminectomy and decompression-- C3 -- T1  . POSTERIOR LAMINECTOMY / DECOMPRESSION LUMBAR SPINE  07-30-2013   @[facility] , [place]   bilateral T11 - 12,  L2 --5  . 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 SHOULDER ARTHROSCOPY WITH ROTATOR CUFF REPAIR Left 12/15/2018    Procedure: SHOULDER ARTHROSCOPY, biceps tenodesis labored Debridement, submacromial decompression;  Surgeon: Sydnee Cabal, MD;  Location: [facility];  Service: Orthopedics;  Laterality: Left;  interscalene block  . 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  Subjective Assessment - 09/14/19 0934    Subjective   Pt reports that he thinks his L little finger is not as curled    Pertinent History  Parkinson's Disease, hypothyroidism, hx of multiple concussions, GERD, benign essential HTN, abnormal gait, L THA 2019, removal of T1 paraspinous mass 2017, T11-12 and L2-5 lumbar lami/decompression 2015, cervical fusion C3-T1 2013, hx of shoulder surgery June 2020    Patient Stated Goals  improve coordination    Currently in Pain?  No/denies    Pain Onset  More than a month ago       Reviewed PWR! Moves in sitting (basic 4) and modified quadruped (up, rock) x20 each with min v.c. for incr movement amplitude.  Reviewed coordination activities from HEP with each UE:  Dealing cards with thumb, flipping card between fingers, rotating ball in fingertips, and ball push-ups with min v.c. for incr movement  amplitude, but improvement noted in all.  Standing, performing rotation to look where he is reaching by turning at feet and hips for decr risk of back injury/pain and improved balance with min-mod cueing.--would benefit from review.          OT Short Term Goals - 09/09/19 0827      OT SHORT TERM GOAL #1   Title  I with PD specific HEP.    Time  4    Period  Weeks    Status  New    Target Date  10/01/19      OT SHORT TERM GOAL #2   Title  Pt will demonstrate improved fine motor coordination fro ADLs as evidenced by decreasing 9 hole peg test score to 49 secs or less for RUE.    Baseline  RUE 53.87 secs, LUE 53.87    Time  4    Period  Weeks    Status  New      OT SHORT TERM GOAL #3   Title  Pt will verbalize  understanding of adapted strategies to maximize safety and I with ADLs/ IADLs.    Time  4    Period  Weeks    Status  New      OT SHORT TERM GOAL #4   Title  Pt will increase RUE grip strength by 5 lbs for increased functional use during ADLs.    Baseline  RUE 48.5, LUE 73.8    Time  4    Period  Weeks    Status  New        OT Long Term Goals - 09/01/19 1033      OT LONG TERM GOAL #1   Title  Pt will verbalize understanding of ways to prevent future PD related complications and PD community resources.    Time  8    Period  Weeks    Status  New    Target Date  10/31/19      OT LONG TERM GOAL #2   Title  Pt will demonstrate improved ease with fastening buttons as evidenced by decreasing 3 button/ unbutton score to 44 secs or less    Baseline  48.06    Time  8    Period  Weeks    Status  New      OT LONG TERM GOAL #3   Title  Pt will demonstrate ability to retrieve a lightweight object with LUE at 125 shoulder flexion    Baseline  LUE 120    Time  8    Period  Weeks    Status  New      OT LONG TERM GOAL #4   Title  Pt will demonstrate ability to retrieve a lightweight object from overhead shelf with -15 elbow extension with RUE    Baseline  RUE -20 elbow extension    Time  8    Period  Weeks    Status  New            Plan - 09/14/19 1215    Clinical Impression Statement  Pt is progressing towards goals. He demonstrates improved movement amplitude with familiar activities and HEP.    OT Occupational Profile and History  Problem Focused Assessment - Including review of records relating to presenting problem    Occupational performance deficits (Please refer to evaluation for details):  ADL's;IADL's;Work;Play;Leisure;Social Participation    Body Structure / Function / Physical Skills  ADL;Balance;Mobility;Strength;Flexibility;UE functional use;FMC;Coordination;Decreased knowledge of precautions;GMC;ROM;Sensation;IADL;Dexterity  Cognitive Skills  Memory    Rehab  Potential  Good    Clinical Decision Making  Limited treatment options, no task modification necessary    Comorbidities Affecting Occupational Performance:  May have comorbidities impacting occupational performance    Modification or Assistance to Complete Evaluation   No modification of tasks or assist necessary to complete eval    OT Frequency  2x / week   anticipate d/c after 6 weeks   OT Duration  8 weeks    OT Treatment/Interventions  Self-care/ADL training;Therapeutic exercise;Aquatic Therapy;Ultrasound;Neuromuscular education;Manual Therapy;Therapeutic activities;Paraffin;DME and/or AE instruction;Cognitive remediation/compensation;Moist Heat;Passive range of motion;Patient/family education;Fluidtherapy    Plan  continue with PWR! moves (supine?), strategies for ADLs    OT Home Exercise Plan  issued PWR! seated and modified quadraped for PWR! up and rock, green putty    Consulted and Agree with Plan of Care  Patient       Patient will benefit from skilled therapeutic intervention in order to improve the following deficits and impairments:   Body Structure / Function / Physical Skills: ADL, Balance, Mobility, Strength, Flexibility, UE functional use, FMC, Coordination, Decreased knowledge of precautions, GMC, ROM, Sensation, IADL, Dexterity Cognitive Skills: Memory     Visit Diagnosis: Other lack of coordination  Stiffness of left shoulder, not elsewhere classified  Other symptoms and signs involving the nervous system  Abnormal posture  Muscle weakness (generalized)  Unsteadiness on feet  Other abnormalities of gait and mobility    Problem List There are no problems to display for this patient.   [facility] 09/14/2019, 12:16 PM  [facility] [address] [place], [place], [address] Phone: [phone]   Fax:  [phone]  Name: Jim Carson MRN: [id number] Date of Birth: [birth year]   Vianne Bulls, OTR/L [facility] [address] [place], [place]  [address] [phone] phone [phone] 09/14/19 12:16 PM

## 2019-09-14 NOTE — Therapy (Signed)
[facility] [address] [place], [place], [address] Phone: [phone]   Fax:  [phone]  Physical Therapy Treatment  Patient Details  Name: Jim Carson MRN: [id number] Date of Birth: 30-Jun-1951 Referring Provider (PT): Gillermo Murdoch   Encounter Date: 09/14/2019  PT End of Session - 09/14/19 1456    Visit Number  6    Number of Visits  13    Date for PT Re-Evaluation  11/21/19    Authorization Type  Medicare/[employer]ARP-will need 10th visit progress note    PT Start Time  0802    PT Stop Time  0843    PT Time Calculation (min)  41 min    Activity Tolerance  Patient tolerated treatment well    Behavior During Therapy  [facility] for tasks assessed/performed       Past Medical History:  Diagnosis Date  . Abnormal gait    due to parkinson's,  uses cane  . Allergic rhinitis   . Benign essential hypertension   . Chronic constipation   . Deviated septum   . GERD (gastroesophageal reflux disease)   . History of multiple concussions    per pt as teen playing football, no residual  . Hypothyroidism    followed by pcp  . Left shoulder pain   . OA (osteoarthritis)   . OSA on CPAP   . Parkinson disease [facility])    neurologist-- dr b. scott  @[facility]  [facility]  . Vitamin B12 deficiency   . Vitamin D deficiency   . Wears glasses     Past Surgical History:  Procedure Laterality Date  . APPENDECTOMY  07/1964  . LAPAROSCOPIC CHOLECYSTECTOMY  01-21-2017   @[facility] , [place]  . POSTERIOR FUSION CERVICAL SPINE  08-15-2011   @[facility] , [place]   w/ laminectomy and decompression-- C3 -- T1  . POSTERIOR LAMINECTOMY / DECOMPRESSION LUMBAR SPINE  07-30-2013   @[facility] , [place]   bilateral T11 - 12,  L2 --5  . 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 SHOULDER ARTHROSCOPY WITH ROTATOR CUFF REPAIR Left 12/15/2018   Procedure: SHOULDER ARTHROSCOPY, biceps tenodesis labored Debridement, submacromial  decompression;  Surgeon: Sydnee Cabal, MD;  Location: [facility];  Service: Orthopedics;  Laterality: Left;  interscalene block  . 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  Subjective Assessment - 09/14/19 0804    Subjective  No changes, no falls.    Patient Stated Goals  Pt's goals for therapy are to improve my balance.    Currently in Pain?  No/denies                       [facility] Adult PT Treatment/Exercise - 09/14/19 0809      Knee/Hip Exercises: Aerobic   Other Aerobic  Scifit level 2>3.0 all 4 extremities x 8 minutes with rpm>75-80 for strenghtrning and flexibility      Knee/Hip Exercises: Standing   Terminal Knee Extension  Strengthening;Right;Left;2 sets;10 reps    Theraband Level (Terminal Knee Extension)  Level 3 (Green)    Other Standing Knee Exercises  side step to R and L x 20 reps with green theraband then side step walk x 8' x 6 in // bars with green band around knees and UE support.  Then sidestep squats with green theraband, 3 reps R and L x 8' in parallel bars      (  Review of HEP from last visit-pt return demo understanding)    Balance Exercises - 09/14/19 0824      Balance Exercises: Standing   SLS  Eyes open;Upper extremity support 1;Upper extremity support 2;3 reps;10 secs   then no UE support, <3 sec each leg   Rockerboard  Anterior/posterior;Lateral;Head turns;Other (comment);EO   See below   Tandem Gait  Forward;Retro;Upper extremity support;3 reps   in parallel bars; progress to tandem march x 3 reps   Retro Gait  3 reps   Forward/back walking in parallel bars-cues for hinge at hips   Marching  Solid surface;Forwards;Other (comment)   3 reps   Other Standing Exercises  On rockerboard:  Anterior/posterior direction:  ankle strategy x 10, hip strategy x 10, then with board level-alt UE lifts x 10, bilat UE lifts x 10, then head turns/head nods x 10.  Board in  lateral position:  hip/ankle strategy excursion x 10 reps, then with board level:  alt UE lifts x 10, head turns x 10, head nods x 10 with min guard/supervision.  Pt able to recover balance without definite reach with UEs.          PT Short Term Goals - 08/23/19 0901      PT SHORT TERM GOAL #1   Title  Pt will be independent with HEP to address balance, transfers, and gait.  TARGET 09/24/2019    Time  4    Period  Weeks    Status  New      PT SHORT TERM GOAL #2   Title  Pt will improve TUG score to less than or equal to 13.5 sec for decreased fall risk.    Time  4    Period  Weeks    Status  New    Target Date  09/24/19      PT SHORT TERM GOAL #3   Title  Pt will improve MiniBESTest score to at least 17/28 for decreased fall risk.    Time  4    Period  Weeks    Status  New        PT Long Term Goals - 08/23/19 0902      PT LONG TERM GOAL #1   Title  Pt will be independent with progression of HEP to address balance, gait.  TARGET 10/08/2019    Time  6    Period  Weeks    Status  New      PT LONG TERM GOAL #2   Title  Pt will improve MiniBESTest score to at least 20/28 for decreased fall risk.    Time  6    Period  Weeks    Status  New      PT LONG TERM GOAL #3   Title  Pt will recover posterior LOB in 2 steps or less to improve balance recovery.    Time  6    Period  Weeks    Status  New      PT LONG TERM GOAL #4   Title  Pt will verbalize plans for return to community fitness upon d/c from PT.    Time  6    Period  Weeks    Status  New            Plan - 09/14/19 1456    Clinical Impression Statement  Reviewed HEP given last visit, with pt return demo understanding.  Pt with improved ability for balance on rockerboard, though ankle plantarflexion direction  remains limited due to decreased strength.  Pt will continue to benefit from skilled PT to further work on balance, strength and gait training for overall improved mobility.    Personal Factors and  Comorbidities  Comorbidity 3+    Comorbidities  PMH Parkinson's disease, neck surgery, shoulder surgery, back surgery    Examination-Activity Limitations  Locomotion Level;Transfers;Stairs    Examination-Participation Restrictions  Community Activity;Other   Return to [facility] exercise   Stability/Clinical Decision Making  Evolving/Moderate complexity    Rehab Potential  Fair    PT Frequency  2x / week    PT Duration  6 weeks   plus eval   PT Treatment/Interventions  ADLs/Self Care Home Management;Gait training;Stair training;Functional mobility training;Therapeutic activities;Therapeutic exercise;Balance training;Neuromuscular re-education;Patient/family education    PT Next Visit Plan  continue hip/ankle strategy work, step strategies to lessen posterior lean and LOB.  Core strengthening; weightshifting and gait activities    PT Home Exercise Plan  Access Code: [email or web address]    Consulted and Agree with Plan of Care  Patient       Patient will benefit from skilled therapeutic intervention in order to improve the following deficits and impairments:  Abnormal gait, Difficulty walking, Decreased balance, Decreased mobility, Postural dysfunction  Visit Diagnosis: Other abnormalities of gait and mobility  Unsteadiness on feet  Muscle weakness (generalized)     Problem List There are no problems to display for this patient.   Moncerrath Berhe W. 09/14/2019, 2:59 PM  Frazier Butt., PT  [facility] [address] [place], [place], [address] Phone: [phone]   Fax:  [phone]  Name: Keylen Duffany MRN: [id number] Date of Birth: 01-25-1951

## 2019-09-14 NOTE — Therapy (Signed)
[facility] [address] [place], [place], [address] Phone: [phone]   Fax:  [phone]  Speech Language Pathology Treatment  Patient Details  Name: Jim Carson MRN: [id number] Date of Birth: [birth year] Referring Provider (SLP): Modena Nunnery., PA   Encounter Date: 09/14/2019  End of Session - 09/14/19 1407    Visit Number  4    Number of Visits  17    Date for SLP Re-Evaluation  11/29/19    SLP Start Time  0850    SLP Stop Time   0930    SLP Time Calculation (min)  40 min    Activity Tolerance  Patient tolerated treatment well       Past Medical History:  Diagnosis Date  . Abnormal gait    due to parkinson's,  uses cane  . Allergic rhinitis   . Benign essential hypertension   . Chronic constipation   . Deviated septum   . GERD (gastroesophageal reflux disease)   . History of multiple concussions    per pt as teen playing football, no residual  . Hypothyroidism    followed by pcp  . Left shoulder pain   . OA (osteoarthritis)   . OSA on CPAP   . Parkinson disease [facility])    neurologist-- dr b. scott  @[facility]  . Vitamin B12 deficiency   . Vitamin D deficiency   . Wears glasses     Past Surgical History:  Procedure Laterality Date  . APPENDECTOMY  07/1964  . LAPAROSCOPIC CHOLECYSTECTOMY  01-21-2017   @[facility] , [place]  . POSTERIOR FUSION CERVICAL SPINE  08-15-2011   @[facility] , [place]   w/ laminectomy and decompression-- C3 -- T1  . POSTERIOR LAMINECTOMY / DECOMPRESSION LUMBAR SPINE  07-30-2013   @[facility] , [place]   bilateral T11 - 12,  L2 --5  . 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 SHOULDER ARTHROSCOPY WITH ROTATOR CUFF REPAIR Left 12/15/2018   Procedure: SHOULDER ARTHROSCOPY, biceps tenodesis labored Debridement, submacromial decompression;  Surgeon: Sydnee Cabal, MD;  Location: [facility];  Service: Orthopedics;  Laterality:  Left;  interscalene block  . 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         ADULT SLP TREATMENT - 09/14/19 0920      General Information   Behavior/Cognition  Alert;Cooperative;Pleasant mood      Treatment Provided   Treatment provided  Cognitive-Linquistic      Cognitive-Linquistic Treatment   Treatment focused on  Patient/family/caregiver education;Dysarthria    Skilled Treatment  SLP had pt complete loud /a/, hi-lo, lo-hi pitch , and everyday sentences to habitualize louder speech in conversation as well as maintain WNL intonation in conversation. Laryngeal push x1 with hi-lo and then pt self-corected with the next rep. Average low-mid 90s dB with loud /a/. SLP engaged pt for 13 minutes converstion at WNL volume with SBA. Pt noted to self correct x3 during this period when he demonstrated suboptimal volume.       Assessment / Recommendations / Plan   Plan  Continue with current plan of care      Progression Toward Goals   Progression toward goals  Progressing toward goals         SLP Short Term Goals - 09/14/19 1409      SLP SHORT TERM GOAL #1   Title  pt  to maintain loud /a/ for at least 10 seconds with upper 80s -low 90s dB in 5 therapy sessions    Baseline  09-09-19, 09-14-19    Time  3    Period  Weeks    Status  On-going      SLP SHORT TERM GOAL #2   Title  pt will produce WNL speech volume in 10 minutes simple conversation in 3 sessions    Baseline  09-14-19    Time  3    Period  Weeks    Status  On-going      SLP SHORT TERM GOAL #3   Title  pt will demo abdomoinal breathing in sentence responses 75% of the time in 3 sessions    Time  3    Period  Weeks    Status  On-going       SLP Long Term Goals - 09/14/19 1410      SLP LONG TERM GOAL #1   Title  pt will demo WNL speech volume in 10 minutes mod complex-complex conversation over 3 sessions    Baseline  09-14-19    Time  7    Period   Weeks   or 17 sessions, for all LTGs   Status  On-going      SLP LONG TERM GOAL #2   Title  pt will demo abdominal breathing75% of the time in 10 minutes simple conversation in 3 sessions    Time  7    Period  Weeks    Status  On-going      SLP LONG TERM GOAL #3   Title  pt will maintain loud /a at low 90s dB average in 4 sessions    Baseline  09-14-19    Time  7    Period  Weeks    Status  On-going       Plan - 09/14/19 1408    Clinical Impression Statement  Pt presents with improving speech volume in conversation - today WNL with self correction x3 was noted in 13 minutes simple to mod complex conversation. Pt will cont to benefit from skilled ST targeting WNL volume in more complex conversation.    Speech Therapy Frequency  2x / week    Duration  --   8 weeks or 17 total sessions   Treatment/Interventions  Functional tasks;SLP instruction and feedback;Compensatory strategies;Patient/family education;Internal/external aids;Cueing hierarchy;Environmental controls    Potential to Achieve Goals  Good    SLP Home Exercise Plan  provided today    Consulted and Agree with Plan of Care  Patient       Patient will benefit from skilled therapeutic intervention in order to improve the following deficits and impairments:   Dysarthria and anarthria  Dysphagia, oropharyngeal phase    Problem List There are no problems to display for this patient.   [facility] ,[place], [place]  09/14/2019, 2:14 PM  [facility] [address] [place], [place], [address] Phone: [phone]   Fax:  [phone]   Name: Jim Carson MRN: [id number] Date of Birth: [birth year]

## 2019-09-17 ENCOUNTER — Encounter: Payer: Self-pay | Admitting: Occupational Therapy

## 2019-09-17 ENCOUNTER — Ambulatory Visit: Payer: Medicare Other | Admitting: Occupational Therapy

## 2019-09-17 ENCOUNTER — Ambulatory Visit: Payer: Medicare Other

## 2019-09-17 ENCOUNTER — Ambulatory Visit: Payer: Medicare Other | Admitting: Physical Therapy

## 2019-09-17 ENCOUNTER — Other Ambulatory Visit: Payer: Self-pay

## 2019-09-17 DIAGNOSIS — R278 Other lack of coordination: Secondary | ICD-10-CM

## 2019-09-17 DIAGNOSIS — R29818 Other symptoms and signs involving the nervous system: Secondary | ICD-10-CM

## 2019-09-17 DIAGNOSIS — M6281 Muscle weakness (generalized): Secondary | ICD-10-CM

## 2019-09-17 DIAGNOSIS — R293 Abnormal posture: Secondary | ICD-10-CM

## 2019-09-17 DIAGNOSIS — M25512 Pain in left shoulder: Secondary | ICD-10-CM

## 2019-09-17 DIAGNOSIS — R471 Dysarthria and anarthria: Secondary | ICD-10-CM

## 2019-09-17 DIAGNOSIS — R2681 Unsteadiness on feet: Secondary | ICD-10-CM

## 2019-09-17 DIAGNOSIS — R2689 Other abnormalities of gait and mobility: Secondary | ICD-10-CM

## 2019-09-17 DIAGNOSIS — M25612 Stiffness of left shoulder, not elsewhere classified: Secondary | ICD-10-CM

## 2019-09-17 DIAGNOSIS — R1312 Dysphagia, oropharyngeal phase: Secondary | ICD-10-CM

## 2019-09-17 NOTE — Therapy (Signed)
[facility] [address] [place], [place], [address] Phone: [phone]   Fax:  [phone]  Physical Therapy Treatment  Patient Details  Name: Jim Carson MRN: [id number] Date of Birth: 07/15/1950 Referring Provider (PT): Gillermo Murdoch   Encounter Date: 09/17/2019  PT End of Session - 09/17/19 1209    Visit Number  7    Number of Visits  13    Date for PT Re-Evaluation  11/21/19    Authorization Type  Medicare/[employer]-will need 10th visit progress note    PT Start Time  0800    PT Stop Time  0843    PT Time Calculation (min)  43 min    Activity Tolerance  Patient tolerated treatment well    Behavior During Therapy  [facility] for tasks assessed/performed       Past Medical History:  Diagnosis Date  . Abnormal gait    due to parkinson's,  uses cane  . Allergic rhinitis   . Benign essential hypertension   . Chronic constipation   . Deviated septum   . GERD (gastroesophageal reflux disease)   . History of multiple concussions    per pt as teen playing football, no residual  . Hypothyroidism    followed by pcp  . Left shoulder pain   . OA (osteoarthritis)   . OSA on CPAP   . Parkinson disease [facility])    neurologist-- dr b. scott  @[facility]  . Vitamin B12 deficiency   . Vitamin D deficiency   . Wears glasses     Past Surgical History:  Procedure Laterality Date  . APPENDECTOMY  07/1964  . LAPAROSCOPIC CHOLECYSTECTOMY  01-21-2017   @[facility] , [place]  . POSTERIOR FUSION CERVICAL SPINE  08-15-2011   @[facility] , [place]   w/ laminectomy and decompression-- C3 -- T1  . POSTERIOR LAMINECTOMY / DECOMPRESSION LUMBAR SPINE  07-30-2013   @[facility] , [place]   bilateral T11 - 12,  L2 --5  . 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 SHOULDER ARTHROSCOPY WITH ROTATOR CUFF REPAIR Left 12/15/2018   Procedure: SHOULDER ARTHROSCOPY, biceps tenodesis labored Debridement, submacromial  decompression;  Surgeon: Sydnee Cabal, MD;  Location: [facility];  Service: Orthopedics;  Laterality: Left;  interscalene block  . 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  Subjective Assessment - 09/17/19 0801    Subjective  No falls, has been doing the exercises.    Patient Stated Goals  Pt's goals for therapy are to improve my balance.    Currently in Pain?  No/denies                       [facility] Adult PT Treatment/Exercise - 09/17/19 0825      Ambulation/Gait   Ambulation/Gait  Yes    Ambulation/Gait Assistance  5: Supervision    Ambulation/Gait Assistance Details  cues for foot clearance (thinking about stepping over an object)    Ambulation Distance (Feet)  --   throughout gym with activities   Assistive device  Straight cane    Gait Pattern  Step-through pattern;Decreased step length - right;Decreased step length - left;Decreased hip/knee flexion - right;Decreased hip/knee flexion - left;Decreased dorsiflexion - right;Festinating;Decreased trunk rotation    Ambulation Surface  Level;Indoor      Neuro Re-ed    Neuro Re-ed Details   With single UE  support on counter: forward step and weight shift and backwards step and weight shift x10 reps B. Facing countertop in modified quadraped postion: alternating forward stepping over black beam for increased foot clearance at lowest height 1 x 10 reps B, followed by raising beam to taller height 1 x 10 reps B, with BUE support lateral weight shift in erect posture with stepping over foam beam at lowest height 1 x 10 reps B, followed by taller height 1 x 10 reps B - pt with auditory cue of hearing foot scuff on the beam if he did not pick it up high enough.       Knee/Hip Exercises: Aerobic   Other Aerobic  SciFit level 3.0 for 5 minutes with BLE and BUE x5 minutes with rpm >75 for strengthening, ROM, activity tolerance.          Pt performs  PWR! Moves in standing position x 10 reps - reviewed HEP    PWR! Up for improved posture - performed at countertop, needed cues for posture and to slow movement down and to hold for a couple of seconds in standing with tall posture before returning to mini squat.   PWR! Rock for improved weighshifting - cues to slow down motion, and cues to have LUE support on counter when performing at home due to pt needing a couple instances of min guard when weight shifting towards R   PWR! Twist for improved trunk rotation - in corner reaching and touching opposite wall, cues for pivoting on feet to decr stress on low back, cues to slow down, pt able to correct himself when needing tall posture      Balance Exercises - 09/17/19 0842      Balance Exercises: Standing   Stepping Strategy  Posterior;Foam/compliant surface;UE support;Limitations    Stepping Strategy Limitations  x15 reps alternating R/L - BUE support fading to single UE support and no UE support, min guard for balance         PT Education - 09/17/19 1209    Education Details  review of standing PWR moves for HEP    Person(s) Educated  Patient    Methods  Explanation;Demonstration    Comprehension  Returned demonstration;Verbalized understanding       PT Short Term Goals - 08/23/19 0901      PT SHORT TERM GOAL #1   Title  Pt will be independent with HEP to address balance, transfers, and gait.  TARGET 09/24/2019    Time  4    Period  Weeks    Status  New      PT SHORT TERM GOAL #2   Title  Pt will improve TUG score to less than or equal to 13.5 sec for decreased fall risk.    Time  4    Period  Weeks    Status  New    Target Date  09/24/19      PT SHORT TERM GOAL #3   Title  Pt will improve MiniBESTest score to at least 17/28 for decreased fall risk.    Time  4    Period  Weeks    Status  New        PT Long Term Goals - 08/23/19 0902      PT LONG TERM GOAL #1   Title  Pt will be independent with progression of HEP  to address balance, gait.  TARGET 10/08/2019    Time  6    Period  [place]  Status  New      PT LONG TERM GOAL #2   Title  Pt will improve MiniBESTest score to at least 20/28 for decreased fall risk.    Time  6    Period  Weeks    Status  New      PT LONG TERM GOAL #3   Title  Pt will recover posterior LOB in 2 steps or less to improve balance recovery.    Time  6    Period  Weeks    Status  New      PT LONG TERM GOAL #4   Title  Pt will verbalize plans for return to community fitness upon d/c from PT.    Time  6    Period  Weeks    Status  New            Plan - 09/17/19 1224    Clinical Impression Statement  Today's skilled session focused on stepping strategies for balance, foot clearance, posture, and weight shifting. Pt needing cues throughout PWR moves to slow down movement due to pt tendency to perform too quickly in order to help prevent posterior loss of balance and hold for tall posture, pt able to demo understanding. Will continue to progress towards LTGs.    Personal Factors and Comorbidities  Comorbidity 3+    Comorbidities  PMH Parkinson's disease, neck surgery, shoulder surgery, back surgery    Examination-Activity Limitations  Locomotion Level;Transfers;Stairs    Examination-Participation Restrictions  Community Activity;Other   Return to [facility] exercise   Stability/Clinical Decision Making  Evolving/Moderate complexity    Rehab Potential  Fair    PT Frequency  2x / week    PT Duration  6 weeks   plus eval   PT Treatment/Interventions  ADLs/Self Care Home Management;Gait training;Stair training;Functional mobility training;Therapeutic activities;Therapeutic exercise;Balance training;Neuromuscular re-education;Patient/family education    PT Next Visit Plan  check STGs. PWR moves for stepping/posture. continue hip/ankle strategy work, step strategies to lessen posterior lean and LOB.  Core strengthening; weightshifting and gait activities    PT Home Exercise Plan   Access Code: [email or web address]    Consulted and Agree with Plan of Care  Patient       Patient will benefit from skilled therapeutic intervention in order to improve the following deficits and impairments:  Abnormal gait, Difficulty walking, Decreased balance, Decreased mobility, Postural dysfunction  Visit Diagnosis: Other lack of coordination  Other symptoms and signs involving the nervous system  Abnormal posture  Muscle weakness (generalized)  Unsteadiness on feet     Problem List There are no problems to display for this patient.   Arliss Journey, PT, DPT  09/17/2019, 12:26 PM  [facility] [address] [place], [place], [address] Phone: [phone]   Fax:  [phone]  Name: Jim Carson MRN: [id number] Date of Birth: 1951-04-11

## 2019-09-17 NOTE — Therapy (Signed)
[facility] [address] [place], [place], [address] Phone: [phone]   Fax:  [phone]  Speech Language Pathology Treatment  Patient Details  Name: Jim Carson MRN: [id number] Date of Birth: 08-Dec-1950 Referring Provider (SLP): Modena Nunnery., PA   Encounter Date: 09/17/2019  End of Session - 09/17/19 1019    Visit Number  5    Number of Visits  17    Date for SLP Re-Evaluation  11/29/19    SLP Start Time  0934    SLP Stop Time   [id number]    SLP Time Calculation (min)  41 min    Activity Tolerance  Patient tolerated treatment well       Past Medical History:  Diagnosis Date  . Abnormal gait    due to parkinson's,  uses cane  . Allergic rhinitis   . Benign essential hypertension   . Chronic constipation   . Deviated septum   . GERD (gastroesophageal reflux disease)   . History of multiple concussions    per pt as teen playing football, no residual  . Hypothyroidism    followed by pcp  . Left shoulder pain   . OA (osteoarthritis)   . OSA on CPAP   . Parkinson disease [facility])    neurologist-- dr b. scott  @[facility]  . Vitamin B12 deficiency   . Vitamin D deficiency   . Wears glasses     Past Surgical History:  Procedure Laterality Date  . APPENDECTOMY  07/1964  . LAPAROSCOPIC CHOLECYSTECTOMY  01-21-2017   @[facility] , [place]  . POSTERIOR FUSION CERVICAL SPINE  08-15-2011   @[facility] , [place]   w/ laminectomy and decompression-- C3 -- T1  . POSTERIOR LAMINECTOMY / DECOMPRESSION LUMBAR SPINE  07-30-2013   @[facility] , [place]   bilateral T11 - 12,  L2 --5  . 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 SHOULDER ARTHROSCOPY WITH ROTATOR CUFF REPAIR Left 12/15/2018   Procedure: SHOULDER ARTHROSCOPY, biceps tenodesis labored Debridement, submacromial decompression;  Surgeon: Sydnee Cabal, MD;  Location: [facility];  Service: Orthopedics;  Laterality:  Left;  interscalene block  . 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  Subjective Assessment - 09/17/19 0943    Currently in Pain?  No/denies            ADULT SLP TREATMENT - 09/17/19 0945      General Information   Behavior/Cognition  Alert;Cooperative;Pleasant mood      Treatment Provided   Treatment provided  Cognitive-Linquistic      Cognitive-Linquistic Treatment   Treatment focused on  Patient/family/caregiver education;Dysarthria    Skilled Treatment  SLP had pt complete loud /a/, hi-lo, lo-hi pitch , and everyday sentences to habitualize louder speech in conversation as well as maintain WNL intonation in conversation. No [place] push with pitch or /a/. Average low 90s - upper 80s dB with loud /a/. Everyday sentences were produced with low 80s dB average. SLP assessed pt's loudness in semi-structured picture sequencing task, which pt produced speech in lower 70s dB average. SLP engaged pt for 10 minutes of converstion outside therapy room (not outside) at volume that was well-audible.      Assessment / Recommendations / Plan   Plan  Continue with current plan of care         SLP Short Term Goals - 09/17/19 1019  SLP SHORT TERM GOAL #1   Title  pt to maintain loud /a/ for at least 10 seconds with upper 80s -low 90s dB in 5 therapy sessions    Baseline  09-09-19, 09-14-19, 09-17-19    Time  3    Period  Weeks    Status  On-going      SLP SHORT TERM GOAL #2   Title  pt will produce WNL speech volume in 10 minutes simple conversation in 3 sessions    Baseline  09-14-19, 09-17-19    Time  3    Period  Weeks    Status  On-going      SLP SHORT TERM GOAL #3   Title  pt will demo abdomoinal breathing in sentence responses 75% of the time in 3 sessions    Time  3    Period  Weeks    Status  On-going       SLP Long Term Goals - 09/17/19 1020      SLP LONG TERM GOAL #1   Title  pt will demo WNL  speech volume in 10 minutes mod complex-complex conversation over 3 sessions    Baseline  09-14-19, 09-17-19    Time  7    Period  Weeks   or 17 sessions, for all LTGs   Status  On-going      SLP LONG TERM GOAL #2   Title  pt will demo abdominal breathing75% of the time in 10 minutes simple conversation in 3 sessions    Time  7    Period  Weeks    Status  On-going      SLP LONG TERM GOAL #3   Title  pt will maintain loud /a at low 90s dB average in 4 sessions    Baseline  09-14-19    Time  7    Period  Weeks    Status  On-going       Plan - 09/17/19 1019    Clinical Impression Statement  Pt presents with improving speech volume in conversation. Pt will cont to benefit from skilled ST targeting WNL volume in more complex conversation.    Speech Therapy Frequency  2x / week    Duration  --   8 weeks or 17 total sessions   Treatment/Interventions  Functional tasks;SLP instruction and feedback;Compensatory strategies;Patient/family education;Internal/external aids;Cueing hierarchy;Environmental controls    Potential to Achieve Goals  Good    SLP Home Exercise Plan  provided today    Consulted and Agree with Plan of Care  Patient       Patient will benefit from skilled therapeutic intervention in order to improve the following deficits and impairments:   Dysarthria and anarthria  Dysphagia, oropharyngeal phase    Problem List There are no problems to display for this patient.   [facility] ,[place], [place]  09/17/2019, 10:20 AM  [facility] [address] [place], [place], [address] Phone: [phone]   Fax:  [phone]   Name: Jim Carson MRN: [id number] Date of Birth: 1950/09/24

## 2019-09-17 NOTE — Therapy (Signed)
[facility] [address] [place], [place], [address] Phone: [phone]   Fax:  [phone]  Occupational Therapy Treatment  Patient Details  Name: Jim Carson MRN: [id number] Date of Birth: 1950-08-15 Referring Provider (OT): Huel Coventry PA-C   Encounter Date: 09/17/2019  OT End of Session - 09/17/19 0845    Visit Number  5    Number of Visits  17    Date for OT Re-Evaluation  10/30/19    Authorization Type  Medicare/ AA/RP    Authorization - Visit Number  5    Authorization - Number of Visits  10    Progress Note Due on Visit  10    OT Start Time  0847    OT Stop Time  0929    OT Time Calculation (min)  42 min    Activity Tolerance  Patient tolerated treatment well    Behavior During Therapy  [facility] for tasks assessed/performed       Past Medical History:  Diagnosis Date  . Abnormal gait    due to parkinson's,  uses cane  . Allergic rhinitis   . Benign essential hypertension   . Chronic constipation   . Deviated septum   . GERD (gastroesophageal reflux disease)   . History of multiple concussions    per pt as teen playing football, no residual  . Hypothyroidism    followed by pcp  . Left shoulder pain   . OA (osteoarthritis)   . OSA on CPAP   . Parkinson disease [facility])    neurologist-- dr b. scott  @[facility]  . Vitamin B12 deficiency   . Vitamin D deficiency   . Wears glasses     Past Surgical History:  Procedure Laterality Date  . APPENDECTOMY  07/1964  . LAPAROSCOPIC CHOLECYSTECTOMY  01-21-2017   @[facility] , [place]  . POSTERIOR FUSION CERVICAL SPINE  08-15-2011   @[facility] , [place]   w/ laminectomy and decompression-- C3 -- T1  . POSTERIOR LAMINECTOMY / DECOMPRESSION LUMBAR SPINE  07-30-2013   @[facility] , [place]   bilateral T11 - 12,  L2 --5  . 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 SHOULDER ARTHROSCOPY WITH ROTATOR CUFF REPAIR Left 12/15/2018    Procedure: SHOULDER ARTHROSCOPY, biceps tenodesis labored Debridement, submacromial decompression;  Surgeon: Sydnee Cabal, MD;  Location: [facility];  Service: Orthopedics;  Laterality: Left;  interscalene block  . 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  Subjective Assessment - 09/17/19 0845    Pertinent History  Parkinson's Disease, hypothyroidism, hx of multiple concussions, GERD, benign essential HTN, abnormal gait, L THA 2019, removal of T1 paraspinous mass 2017, T11-12 and L2-5 lumbar lami/decompression 2015, cervical fusion C3-T1 2013, hx of shoulder surgery June 2020    Patient Stated Goals  improve coordination    Currently in Pain?  No/denies    Pain Onset  More than a month ago         Began education regarding importance of large amplitude movement for ADLs to incr ease, incr safety, and decr risk of future complications Including looking where he is reaching (for trunk rotation, visual reinforcement of movement size, and head/eye movements, and to decr risk of shoulder pain), keep feet apart.  Simulated ADLs with bag with focus/min cues for large amplitude movements:  Donning/doffing pull-over shirt, pulling shirt down in back/drying  back, pulling bag into hand for clothing adjustments/donning socks.  Education regarding standing with feet apart while dressing for improved balance and associated trunk movements   Supine>sitting with min cues for use of large amplitude movement strategy to incr ease and decr stress on back to prevent pain.  Pt verbalized understanding and returned demo with cueing.          OT Education - 09/17/19 0853    Education Details  PWR! moves (basic 4) in supine with min cueing for large amplitude movements    Person(s) Educated  Patient    Methods  Explanation;Demonstration;Verbal cues;Handout    Comprehension  Verbalized understanding;Returned  demonstration;Verbal cues required       OT Short Term Goals - 09/09/19 0827      OT SHORT TERM GOAL #1   Title  I with PD specific HEP.    Time  4    Period  Weeks    Status  New    Target Date  10/01/19      OT SHORT TERM GOAL #2   Title  Pt will demonstrate improved fine motor coordination fro ADLs as evidenced by decreasing 9 hole peg test score to 49 secs or less for RUE.    Baseline  RUE 53.87 secs, LUE 53.87    Time  4    Period  Weeks    Status  New      OT SHORT TERM GOAL #3   Title  Pt will verbalize understanding of adapted strategies to maximize safety and I with ADLs/ IADLs.    Time  4    Period  Weeks    Status  New      OT SHORT TERM GOAL #4   Title  Pt will increase RUE grip strength by 5 lbs for increased functional use during ADLs.    Baseline  RUE 48.5, LUE 73.8    Time  4    Period  Weeks    Status  New        OT Long Term Goals - 09/01/19 1033      OT LONG TERM GOAL #1   Title  Pt will verbalize understanding of ways to prevent future PD related complications and PD community resources.    Time  8    Period  Weeks    Status  New    Target Date  10/31/19      OT LONG TERM GOAL #2   Title  Pt will demonstrate improved ease with fastening buttons as evidenced by decreasing 3 button/ unbutton score to 44 secs or less    Baseline  48.06    Time  8    Period  Weeks    Status  New      OT LONG TERM GOAL #3   Title  Pt will demonstrate ability to retrieve a lightweight object with LUE at 125 shoulder flexion    Baseline  LUE 120    Time  8    Period  Weeks    Status  New      OT LONG TERM GOAL #4   Title  Pt will demonstrate ability to retrieve a lightweight object from overhead shelf with -15 elbow extension with RUE    Baseline  RUE -20 elbow extension    Time  8    Period  Weeks    Status  New            Plan - 09/17/19 [id number]  Clinical Impression Statement  Pt is progressing towards goals. He demonstrates improved movement  amplitude with familiar activities and HEP.    OT Occupational Profile and History  Problem Focused Assessment - Including review of records relating to presenting problem    Occupational performance deficits (Please refer to evaluation for details):  ADL's;IADL's;Work;Play;Leisure;Social Participation    Body Structure / Function / Physical Skills  ADL;Balance;Mobility;Strength;Flexibility;UE functional use;FMC;Coordination;Decreased knowledge of precautions;GMC;ROM;Sensation;IADL;Dexterity    Cognitive Skills  Memory    Rehab Potential  Good    Clinical Decision Making  Limited treatment options, no task modification necessary    Comorbidities Affecting Occupational Performance:  May have comorbidities impacting occupational performance    Modification or Assistance to Complete Evaluation   No modification of tasks or assist necessary to complete eval    OT Frequency  2x / week   anticipate d/c after 6 weeks   OT Duration  8 weeks    OT Treatment/Interventions  Self-care/ADL training;Therapeutic exercise;Aquatic Therapy;Ultrasound;Neuromuscular education;Manual Therapy;Therapeutic activities;Paraffin;DME and/or AE instruction;Cognitive remediation/compensation;Moist Heat;Passive range of motion;Patient/family education;Fluidtherapy    Plan  continue with  strategies for ADLs, large amplitude movements    OT Home Exercise Plan  issued PWR! seated and modified quadraped for PWR! up and rock, green putty    Consulted and Agree with Plan of Care  Patient       Patient will benefit from skilled therapeutic intervention in order to improve the following deficits and impairments:   Body Structure / Function / Physical Skills: ADL, Balance, Mobility, Strength, Flexibility, UE functional use, FMC, Coordination, Decreased knowledge of precautions, GMC, ROM, Sensation, IADL, Dexterity Cognitive Skills: Memory     Visit Diagnosis: Other lack of coordination  Stiffness of left shoulder, not  elsewhere classified  Other symptoms and signs involving the nervous system  Abnormal posture  Muscle weakness (generalized)  Unsteadiness on feet  Other abnormalities of gait and mobility  Acute pain of left shoulder    Problem List There are no problems to display for this patient.   [facility] 09/17/2019, 11:20 AM  [facility] [address] [place], [place], [address] Phone: [phone]   Fax:  [phone]  Name: Jim Carson MRN: [id number] Date of Birth: 1950-10-22   Vianne Bulls, OTR/L [facility] [address] [place], [place]  [address] [phone] phone [phone] 09/17/19 11:20 AM

## 2019-09-21 ENCOUNTER — Encounter: Payer: Self-pay | Admitting: Occupational Therapy

## 2019-09-21 ENCOUNTER — Ambulatory Visit: Payer: Medicare Other | Admitting: Occupational Therapy

## 2019-09-21 ENCOUNTER — Ambulatory Visit: Payer: Medicare Other | Admitting: Physical Therapy

## 2019-09-21 ENCOUNTER — Other Ambulatory Visit: Payer: Self-pay

## 2019-09-21 ENCOUNTER — Ambulatory Visit: Payer: Medicare Other

## 2019-09-21 DIAGNOSIS — R29818 Other symptoms and signs involving the nervous system: Secondary | ICD-10-CM

## 2019-09-21 DIAGNOSIS — R293 Abnormal posture: Secondary | ICD-10-CM

## 2019-09-21 DIAGNOSIS — R1312 Dysphagia, oropharyngeal phase: Secondary | ICD-10-CM

## 2019-09-21 DIAGNOSIS — R2681 Unsteadiness on feet: Secondary | ICD-10-CM

## 2019-09-21 DIAGNOSIS — R278 Other lack of coordination: Secondary | ICD-10-CM

## 2019-09-21 DIAGNOSIS — R2689 Other abnormalities of gait and mobility: Secondary | ICD-10-CM

## 2019-09-21 DIAGNOSIS — M6281 Muscle weakness (generalized): Secondary | ICD-10-CM

## 2019-09-21 DIAGNOSIS — R471 Dysarthria and anarthria: Secondary | ICD-10-CM

## 2019-09-21 DIAGNOSIS — M25612 Stiffness of left shoulder, not elsewhere classified: Secondary | ICD-10-CM

## 2019-09-21 NOTE — Therapy (Signed)
[facility] [address] [place], [place], [address] Phone: [phone]   Fax:  [phone]  Physical Therapy Treatment  Patient Details  Name: Jim Carson MRN: [id number] Date of Birth: 1951-04-02 Referring Provider (PT): Gillermo Murdoch   Encounter Date: 09/21/2019  PT End of Session - 09/21/19 1106    Visit Number  8    Number of Visits  13    Date for PT Re-Evaluation  11/21/19    Authorization Type  Medicare/[employer]-will need 10th visit progress note    PT Start Time  1020    PT Stop Time  1100    PT Time Calculation (min)  40 min    Equipment Utilized During Treatment  Gait belt    Activity Tolerance  Patient tolerated treatment well    Behavior During Therapy  [facility] for tasks assessed/performed       Past Medical History:  Diagnosis Date  . Abnormal gait    due to parkinson's,  uses cane  . Allergic rhinitis   . Benign essential hypertension   . Chronic constipation   . Deviated septum   . GERD (gastroesophageal reflux disease)   . History of multiple concussions    per pt as teen playing football, no residual  . Hypothyroidism    followed by pcp  . Left shoulder pain   . OA (osteoarthritis)   . OSA on CPAP   . Parkinson disease [facility])    neurologist-- dr b. scott  @[facility]  . Vitamin B12 deficiency   . Vitamin D deficiency   . Wears glasses     Past Surgical History:  Procedure Laterality Date  . APPENDECTOMY  07/1964  . LAPAROSCOPIC CHOLECYSTECTOMY  01-21-2017   @[facility] , [place]  . POSTERIOR FUSION CERVICAL SPINE  08-15-2011   @[facility] , [place]   w/ laminectomy and decompression-- C3 -- T1  . POSTERIOR LAMINECTOMY / DECOMPRESSION LUMBAR SPINE  07-30-2013   @[facility] , [place]   bilateral T11 - 12,  L2 --5  . 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 SHOULDER ARTHROSCOPY WITH ROTATOR CUFF REPAIR Left 12/15/2018   Procedure: SHOULDER ARTHROSCOPY,  biceps tenodesis labored Debridement, submacromial decompression;  Surgeon: Sydnee Cabal, MD;  Location: [facility];  Service: Orthopedics;  Laterality: Left;  interscalene block  . 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  Subjective Assessment - 09/21/19 1022    Subjective  No falls, nothing new. Felt "pretty limber" after last time.    Patient Stated Goals  Pt's goals for therapy are to improve my balance.    Currently in Pain?  No/denies                       [facility] Adult PT Treatment/Exercise - 09/21/19 0001      Ambulation/Gait   Ambulation/Gait  Yes    Ambulation/Gait Assistance  5: Supervision    Ambulation/Gait Assistance Details  cues for foot clearance throughout session     Ambulation Distance (Feet)  115 Feet   approx. in between clinic activities   Assistive device  Straight cane    Gait Pattern  Step-through pattern;Decreased step length - right;Decreased step length - left;Decreased hip/knee flexion - right;Decreased hip/knee flexion - left;Decreased dorsiflexion - right;Festinating;Decreased trunk rotation    Ambulation Surface  Level;Indoor  Pt performs PWR! Moves in supine position:    PWR! Up for improved posture 2 x 10 reps - cues for lifting arms up and bringing back by pt's side for posture - 1 rep with lightweight ball on pt's chest as visual/tactile cue to lift chest up  PWR! Step for improved step initiation - 1 x 10 reps B stepping each leg over black beam at highest height, 10 reps of PWR! Step with bringing BLE out to the side and then lifting hips   Cues provided for breathing, and holding movement for incr stretch with PWR! Up      Balance Exercises - 09/21/19 1107      Balance Exercises: Standing   Wall Bumps  Hip;Eyes opened;Limitations    Wall Bumps Limitations  x5 reps on level ground, progressing to x12 reps on blue foam beam with wide BOS -  cues for technique and posture -"sticking belly button forwards"    Stepping Strategy  Anterior;Foam/compliant surface;UE support    Stepping Strategy Limitations  single UE support forward stepping strategy with tapping heel first     Rockerboard  Anterior/posterior;Head turns;Intermittent UE support;Limitations    Rockerboard Limitations  ankle/hip strategy A/P direction progressing from BUE support to no UE support - incr difficulty weight shifting onto toes and occassional min A when weight shifting posteriorly onto heels. Keeping rockerboard steady: alternating UE lifts for posture and balance x10 reps, reciprocal arm swing x10 reps - cues to slow down motion at times, pt with all LOB posteriorly           PT Short Term Goals - 08/23/19 0901      PT SHORT TERM GOAL #1   Title  Pt will be independent with HEP to address balance, transfers, and gait.  TARGET 09/24/2019    Time  4    Period  Weeks    Status  New      PT SHORT TERM GOAL #2   Title  Pt will improve TUG score to less than or equal to 13.5 sec for decreased fall risk.    Time  4    Period  Weeks    Status  New    Target Date  09/24/19      PT SHORT TERM GOAL #3   Title  Pt will improve MiniBESTest score to at least 17/28 for decreased fall risk.    Time  4    Period  Weeks    Status  New        PT Long Term Goals - 08/23/19 0902      PT LONG TERM GOAL #1   Title  Pt will be independent with progression of HEP to address balance, gait.  TARGET 10/08/2019    Time  6    Period  Weeks    Status  New      PT LONG TERM GOAL #2   Title  Pt will improve MiniBESTest score to at least 20/28 for decreased fall risk.    Time  6    Period  Weeks    Status  New      PT LONG TERM GOAL #3   Title  Pt will recover posterior LOB in 2 steps or less to improve balance recovery.    Time  6    Period  Weeks    Status  New      PT LONG TERM GOAL #4   Title  Pt will verbalize plans for return to  community fitness upon d/c  from PT.    Time  6    Period  Weeks    Status  New            Plan - 09/21/19 1112    Clinical Impression Statement  Today's skilled session focused on posture, balance strategies on compliant surfaces, and foot clearance.Pt does well with visual cues for foot clearance especially for Supine PWR! step initiation. Pt with difficulty with weight shifting onto toes on rockerboard and had tendency to lose balance posteriorly - needing to catch self on bars. Pt improved with hip strategy on a compliant surface with incr reps. Will continue to progress towards LTGs.    Personal Factors and Comorbidities  Comorbidity 3+    Comorbidities  PMH Parkinson's disease, neck surgery, shoulder surgery, back surgery    Examination-Activity Limitations  Locomotion Level;Transfers;Stairs    Examination-Participation Restrictions  Community Activity;Other   Return to [facility] exercise   Stability/Clinical Decision Making  Evolving/Moderate complexity    Rehab Potential  Fair    PT Frequency  2x / week    PT Duration  6 weeks   plus eval   PT Treatment/Interventions  ADLs/Self Care Home Management;Gait training;Stair training;Functional mobility training;Therapeutic activities;Therapeutic exercise;Balance training;Neuromuscular re-education;Patient/family education    PT Next Visit Plan  check STGs. PWR moves for stepping/posture. continue hip/ankle strategy work, step strategies to lessen posterior lean and LOB.  Core strengthening; weightshifting and gait activities    PT Home Exercise Plan  Access Code: [email or web address]    Consulted and Agree with Plan of Care  Patient       Patient will benefit from skilled therapeutic intervention in order to improve the following deficits and impairments:  Abnormal gait, Difficulty walking, Decreased balance, Decreased mobility, Postural dysfunction  Visit Diagnosis: Other lack of coordination  Other symptoms and signs involving the nervous system  Abnormal  posture  Muscle weakness (generalized)  Unsteadiness on feet  Other abnormalities of gait and mobility     Problem List There are no problems to display for this patient.   Arliss Journey, PT, DPT  09/21/2019, 2:27 PM  [facility] [address] [place], [place], [address] Phone: [phone]   Fax:  [phone]  Name: Jim Carson MRN: [id number] Date of Birth: 1951/02/05

## 2019-09-21 NOTE — Therapy (Signed)
[facility] [address] [place], [place], [address] Phone: [phone]   Fax:  [phone]  Occupational Therapy Treatment  Patient Details  Name: Jim Carson MRN: [id number] Date of Birth: 30-Jan-1951 Referring Provider (OT): Huel Coventry PA-C   Encounter Date: 09/21/2019  OT End of Session - 09/21/19 0855    Visit Number  6    Number of Visits  17    Date for OT Re-Evaluation  10/30/19    Authorization Type  Medicare/ AA/RP    Authorization - Visit Number  5    Authorization - Number of Visits  10    Progress Note Due on Visit  10    OT Start Time  0850    OT Stop Time  0930    OT Time Calculation (min)  40 min    Activity Tolerance  Patient tolerated treatment well    Behavior During Therapy  [employer] for tasks assessed/performed       Past Medical History:  Diagnosis Date  . Abnormal gait    due to parkinson's,  uses cane  . Allergic rhinitis   . Benign essential hypertension   . Chronic constipation   . Deviated septum   . GERD (gastroesophageal reflux disease)   . History of multiple concussions    per pt as teen playing football, no residual  . Hypothyroidism    followed by pcp  . Left shoulder pain   . OA (osteoarthritis)   . OSA on CPAP   . Parkinson disease [facility])    neurologist-- dr b. scott  @[facility]  . Vitamin B12 deficiency   . Vitamin D deficiency   . Wears glasses     Past Surgical History:  Procedure Laterality Date  . APPENDECTOMY  07/1964  . LAPAROSCOPIC CHOLECYSTECTOMY  01-21-2017   @[facility] , [place]  . POSTERIOR FUSION CERVICAL SPINE  08-15-2011   @[facility] , [place]   w/ laminectomy and decompression-- C3 -- T1  . POSTERIOR LAMINECTOMY / DECOMPRESSION LUMBAR SPINE  07-30-2013   @[facility] , [place]   bilateral T11 - 12,  L2 --5  . 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 SHOULDER ARTHROSCOPY WITH ROTATOR CUFF REPAIR Left 12/15/2018    Procedure: SHOULDER ARTHROSCOPY, biceps tenodesis labored Debridement, submacromial decompression;  Surgeon: Sydnee Cabal, MD;  Location: [facility];  Service: Orthopedics;  Laterality: Left;  interscalene block  . 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  Subjective Assessment - 09/21/19 0853    Pertinent History  Parkinson's Disease, hypothyroidism, hx of multiple concussions, GERD, benign essential HTN, abnormal gait, L THA 2019, removal of T1 paraspinous mass 2017, T11-12 and L2-5 lumbar lami/decompression 2015, cervical fusion C3-T1 2013, hx of shoulder surgery June 2020    Patient Stated Goals  improve coordination    Currently in Pain?  No/denies    Pain Onset  More than a month ago        PWR! Moves (basic 4) in supine x 20 each with min cues For incr movement amplitude.  Practiced donning/doffing jacket using large amplitude movement strategies.  Pt does well with good trunk rotation donning, but needed min cueing for doffing to incr trunk rotation (1 shoulder first).    1 LOB with min A/chair assist to recover (pt tends to lose balance backwards).   Simulated ADLs with bag  with focus/min cues for large amplitude movements:  Donning/doffing pull-over shirt, donning/doffing pants, pulling bag into hand for clothing adjustment/donning socks  Practiced writing 5 sentences with good legibility and no significant decr in letter size.  Continuous "l" with min cueing/focus on spacing and control.        OT Short Term Goals - 09/09/19 0827      OT SHORT TERM GOAL #1   Title  I with PD specific HEP.    Time  4    Period  Weeks    Status  New    Target Date  10/01/19      OT SHORT TERM GOAL #2   Title  Pt will demonstrate improved fine motor coordination fro ADLs as evidenced by decreasing 9 hole peg test score to 49 secs or less for RUE.    Baseline  RUE 53.87 secs, LUE 53.87    Time  4     Period  Weeks    Status  New      OT SHORT TERM GOAL #3   Title  Pt will verbalize understanding of adapted strategies to maximize safety and I with ADLs/ IADLs.    Time  4    Period  Weeks    Status  New      OT SHORT TERM GOAL #4   Title  Pt will increase RUE grip strength by 5 lbs for increased functional use during ADLs.    Baseline  RUE 48.5, LUE 73.8    Time  4    Period  Weeks    Status  New        OT Long Term Goals - 09/01/19 1033      OT LONG TERM GOAL #1   Title  Pt will verbalize understanding of ways to prevent future PD related complications and PD community resources.    Time  8    Period  Weeks    Status  New    Target Date  10/31/19      OT LONG TERM GOAL #2   Title  Pt will demonstrate improved ease with fastening buttons as evidenced by decreasing 3 button/ unbutton score to 44 secs or less    Baseline  48.06    Time  8    Period  Weeks    Status  New      OT LONG TERM GOAL #3   Title  Pt will demonstrate ability to retrieve a lightweight object with LUE at 125 shoulder flexion    Baseline  LUE 120    Time  8    Period  Weeks    Status  New      OT LONG TERM GOAL #4   Title  Pt will demonstrate ability to retrieve a lightweight object from overhead shelf with -15 elbow extension with RUE    Baseline  RUE -20 elbow extension    Time  8    Period  Weeks    Status  New            Plan - 09/21/19 [id number]    Clinical Impression Statement  Pt is progressing towards goals. He demonstrates improved movement amplitude with familiar activities or once given cueing.    OT Occupational Profile and History  Problem Focused Assessment - Including review of records relating to presenting problem    Occupational performance deficits (Please refer to evaluation for details):  ADL's;IADL's;Work;Play;Leisure;Social Participation    Body Structure / Function / Physical  Skills  ADL;Balance;Mobility;Strength;Flexibility;UE functional  use;FMC;Coordination;Decreased knowledge of precautions;GMC;ROM;Sensation;IADL;Dexterity    Cognitive Skills  Memory    Rehab Potential  Good    Clinical Decision Making  Limited treatment options, no task modification necessary    Comorbidities Affecting Occupational Performance:  May have comorbidities impacting occupational performance    Modification or Assistance to Complete Evaluation   No modification of tasks or assist necessary to complete eval    OT Frequency  2x / week   anticipate d/c after 6 weeks   OT Duration  8 weeks    OT Treatment/Interventions  Self-care/ADL training;Therapeutic exercise;Aquatic Therapy;Ultrasound;Neuromuscular education;Manual Therapy;Therapeutic activities;Paraffin;DME and/or AE instruction;Cognitive remediation/compensation;Moist Heat;Passive range of motion;Patient/family education;Fluidtherapy    Plan  continue with  strategies for ADLs--issue and review handout, practice step backwards with reach as pt tends to lose balance backwards.    OT Home Exercise Plan  issued PWR! seated and modified quadraped for PWR! up and rock, green putty    Consulted and Agree with Plan of Care  Patient       Patient will benefit from skilled therapeutic intervention in order to improve the following deficits and impairments:   Body Structure / Function / Physical Skills: ADL, Balance, Mobility, Strength, Flexibility, UE functional use, FMC, Coordination, Decreased knowledge of precautions, GMC, ROM, Sensation, IADL, Dexterity Cognitive Skills: Memory     Visit Diagnosis: Other lack of coordination  Stiffness of left shoulder, not elsewhere classified  Other symptoms and signs involving the nervous system  Abnormal posture  Muscle weakness (generalized)  Other abnormalities of gait and mobility  Unsteadiness on feet    Problem List There are no problems to display for this patient.   [facility] 09/21/2019, 10:06 AM  [facility] [address] [place], [place], [address] Phone: [phone]   Fax:  [phone]  Name: Serge Bayerl MRN: [id number] Date of Birth: August 06, 1950   Vianne Bulls, OTR/L [facility] [address] [place], [place]  [address] [phone] phone [phone] 09/21/19 10:06 AM

## 2019-09-21 NOTE — Therapy (Signed)
[facility] [address] [place], [place], [address] Phone: [phone]   Fax:  [phone]  Speech Language Pathology Treatment  Patient Details  Name: Jim Carson MRN: [id number] Date of Birth: [birth year] Referring Provider (SLP): Modena Nunnery., PA   Encounter Date: 09/21/2019  End of Session - 09/21/19 1035    Visit Number  6    Number of Visits  17    Date for SLP Re-Evaluation  11/29/19    SLP Start Time  0934    SLP Stop Time   [id number]    SLP Time Calculation (min)  40 min       Past Medical History:  Diagnosis Date  . Abnormal gait    due to parkinson's,  uses cane  . Allergic rhinitis   . Benign essential hypertension   . Chronic constipation   . Deviated septum   . GERD (gastroesophageal reflux disease)   . History of multiple concussions    per pt as teen playing football, no residual  . Hypothyroidism    followed by pcp  . Left shoulder pain   . OA (osteoarthritis)   . OSA on CPAP   . Parkinson disease [facility])    neurologist-- dr b. scott  @[facility]  . Vitamin B12 deficiency   . Vitamin D deficiency   . Wears glasses     Past Surgical History:  Procedure Laterality Date  . APPENDECTOMY  07/1964  . LAPAROSCOPIC CHOLECYSTECTOMY  01-21-2017   @[facility] , [place]  . POSTERIOR FUSION CERVICAL SPINE  08-15-2011   @[facility] , [place]   w/ laminectomy and decompression-- C3 -- T1  . POSTERIOR LAMINECTOMY / DECOMPRESSION LUMBAR SPINE  07-30-2013   @[facility] , [place]   bilateral T11 - 12,  L2 --5  . 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 SHOULDER ARTHROSCOPY WITH ROTATOR CUFF REPAIR Left 12/15/2018   Procedure: SHOULDER ARTHROSCOPY, biceps tenodesis labored Debridement, submacromial decompression;  Surgeon: Sydnee Cabal, MD;  Location: [facility];  Service: Orthopedics;  Laterality: Left;  interscalene block  . TOTAL HIP ARTHROPLASTY  Left 09/18/2017   @[facility] , [place]    There were no vitals filed for this visit.  Subjective Assessment - 09/21/19 0939    Subjective  'It is hard to do 25-30 in a row." (pt, regarding swallowing exercises)    Currently in Pain?  No/denies            ADULT SLP TREATMENT - 09/21/19 0939      General Information   Behavior/Cognition  Alert;Cooperative;Pleasant mood      Treatment Provided   Treatment provided  Dysphagia;Cognitive-Linquistic      Dysphagia Treatment   Other treatment/comments  HEP for dysphagia completed with iniital min A for chin tuck/fist push (CTAR) for 2-3 second hold instead of a swallow with the push.       Cognitive-Linquistic Treatment   Treatment focused on  Dysarthria    Skilled Treatment  SLP had pt complete loud /a/, hi-lo, lo-hi pitch , and everyday sentences to habitualize louder speech in conversation as well as maintain WNL intonation in conversation. No Laryngeal push with pitch or /a/. Average loud /a/ was low 90s - upper 80s dB, and everyday sentences were produced with low 80s dB average. SLP assessed pt's loudness in 10 minutes of simple to mod complex converstion for 20 minutes - pt maintained average volume low 70s dB with one  SLP cue for loudness approx 17 minutes into the conversation. Pt acknowledged his wife is asking him to repeat less frequently than prior to ST however cont to ask him to repeat each day. Pt thinks wife may be slightly HOH due to the volume at which she watches TV. SLP told pt about external cues for loudness improvement that pt could begin using      Assessment / Recommendations / [place] with current plan of care      Progression Toward Goals   Progression toward goals  Progressing toward goals       SLP Education - 09/21/19 1034    Education Details  external cues for loudness    Person(s) Educated  Patient    Methods  Explanation    Comprehension  Verbalized understanding       SLP Short Term  Goals - 09/21/19 1037      SLP SHORT TERM GOAL #1   Title  pt to maintain loud /a/ for at least 10 seconds with upper 80s -low 90s dB in 5 therapy sessions    Baseline  09-09-19, 09-14-19, 09-17-19, 09-21-19    Time  2    Period  Weeks    Status  On-going      SLP SHORT TERM GOAL #2   Title  pt will produce WNL speech volume in 10 minutes simple conversation in 3 sessions    Baseline  09-14-19, 09-17-19, 09-21-19    Time  2    Period  Weeks    Status  On-going      SLP SHORT TERM GOAL #3   Title  pt will demo abdomoinal breathing in sentence responses 75% of the time in 3 sessions    Time  2    Period  Weeks    Status  On-going       SLP Long Term Goals - 09/21/19 1037      SLP LONG TERM GOAL #1   Title  pt will demo WNL speech volume in 10 minutes mod complex-complex conversation over 3 sessions    Baseline  09-14-19, 09-17-19    Time  6    Period  Weeks   or 17 sessions, for all LTGs   Status  On-going      SLP LONG TERM GOAL #2   Title  pt will demo abdominal breathing75% of the time in 10 minutes simple conversation in 3 sessions    Time  6    Period  Weeks    Status  On-going      SLP LONG TERM GOAL #3   Title  pt will maintain loud /a at low 90s dB average in 4 sessions    Baseline  09-14-19    Time  6    Period  Weeks    Status  On-going       Plan - 09/21/19 1036    Clinical Impression Statement  Pt presents with improving speech volume in conversation. He was told today about external cues for loudness management throughout the day. Pt will cont to benefit from skilled ST targeting WNL volume in more complex conversation.    Speech Therapy Frequency  2x / week    Duration  --   8 weeks or 17 total sessions   Treatment/Interventions  Functional tasks;SLP instruction and feedback;Compensatory strategies;Patient/family education;Internal/external aids;Cueing hierarchy;Environmental controls    Potential to Achieve Goals  Good    SLP Home Exercise Plan  provided  today    Consulted and Agree with Plan of Care  Patient       Patient will benefit from skilled therapeutic intervention in order to improve the following deficits and impairments:   Dysarthria and anarthria  Dysphagia, oropharyngeal phase    Problem List There are no problems to display for this patient.   [facility] ,[place], [place]  09/21/2019, 10:38 AM  [facility] [address] [place], [place], [address] Phone: [phone]   Fax:  [phone]   Name: Jim Carson MRN: [id number] Date of Birth: 05/18/1951

## 2019-09-23 ENCOUNTER — Ambulatory Visit: Payer: Medicare Other | Admitting: Physical Therapy

## 2019-09-23 ENCOUNTER — Encounter: Payer: Self-pay | Admitting: Physical Therapy

## 2019-09-23 ENCOUNTER — Ambulatory Visit: Payer: Medicare Other | Admitting: Occupational Therapy

## 2019-09-23 ENCOUNTER — Ambulatory Visit: Payer: Medicare Other

## 2019-09-23 ENCOUNTER — Other Ambulatory Visit: Payer: Self-pay

## 2019-09-23 DIAGNOSIS — R278 Other lack of coordination: Secondary | ICD-10-CM | POA: Diagnosis not present

## 2019-09-23 DIAGNOSIS — R293 Abnormal posture: Secondary | ICD-10-CM

## 2019-09-23 DIAGNOSIS — R29818 Other symptoms and signs involving the nervous system: Secondary | ICD-10-CM

## 2019-09-23 DIAGNOSIS — M25612 Stiffness of left shoulder, not elsewhere classified: Secondary | ICD-10-CM

## 2019-09-23 DIAGNOSIS — R2681 Unsteadiness on feet: Secondary | ICD-10-CM

## 2019-09-23 DIAGNOSIS — M6281 Muscle weakness (generalized): Secondary | ICD-10-CM

## 2019-09-23 DIAGNOSIS — R471 Dysarthria and anarthria: Secondary | ICD-10-CM

## 2019-09-23 DIAGNOSIS — R1312 Dysphagia, oropharyngeal phase: Secondary | ICD-10-CM

## 2019-09-23 NOTE — Therapy (Signed)
[facility] [address] [place], [place], [address] Phone: [phone]   Fax:  [phone]  Occupational Therapy Treatment  Patient Details  Name: Jim Carson MRN: [id number] Date of Birth: [birth year] Referring Provider (OT): Huel Coventry PA-C   Encounter Date: 09/23/2019  OT End of Session - 09/23/19 1313    Visit Number  8    Number of Visits  17    Date for OT Re-Evaluation  10/30/19    Authorization Type  Medicare/ AA/RP    Authorization - Visit Number  8    Authorization - Number of Visits  10    Progress Note Due on Visit  10    OT Start Time  0935    OT Stop Time  1015    OT Time Calculation (min)  40 min    Activity Tolerance  Patient tolerated treatment well    Behavior During Therapy  [facility] for tasks assessed/performed       Past Medical History:  Diagnosis Date  . Abnormal gait    due to parkinson's,  uses cane  . Allergic rhinitis   . Benign essential hypertension   . Chronic constipation   . Deviated septum   . GERD (gastroesophageal reflux disease)   . History of multiple concussions    per pt as teen playing football, no residual  . Hypothyroidism    followed by pcp  . Left shoulder pain   . OA (osteoarthritis)   . OSA on CPAP   . Parkinson disease [facility])    neurologist-- dr b. scott  @[facility]  [facility]  . Vitamin B12 deficiency   . Vitamin D deficiency   . Wears glasses     Past Surgical History:  Procedure Laterality Date  . APPENDECTOMY  07/1964  . LAPAROSCOPIC CHOLECYSTECTOMY  01-21-2017   @[facility] , [place]  . POSTERIOR FUSION CERVICAL SPINE  08-15-2011   @[facility] , [place]   w/ laminectomy and decompression-- C3 -- T1  . POSTERIOR LAMINECTOMY / DECOMPRESSION LUMBAR SPINE  07-30-2013   @[facility] , [place]   bilateral T11 - 12,  L2 --5  . 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 SHOULDER ARTHROSCOPY WITH ROTATOR CUFF REPAIR Left 12/15/2018    Procedure: SHOULDER ARTHROSCOPY, biceps tenodesis labored Debridement, submacromial decompression;  Surgeon: Sydnee Cabal, MD;  Location: [facility];  Service: Orthopedics;  Laterality: Left;  interscalene block  . 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  Subjective Assessment - 09/23/19 0949    Subjective   Pt denies pain, reports stiffness    Pertinent History  Parkinson's Disease, hypothyroidism, hx of multiple concussions, GERD, benign essential HTN, abnormal gait, L THA 2019, removal of T1 paraspinous mass 2017, T11-12 and L2-5 lumbar lami/decompression 2015, cervical fusion C3-T1 2013, hx of shoulder surgery June 2020    Patient Stated Goals  improve coordination    Currently in Pain?  No/denies                 Treatment: Modified quadraped PWR! up and rock followed by PWR! Standing for PWR! Rock and twist(in corner with chair in front due to losses of balance), min v.c to slow down Bag exercises for simulated ADLS: donning shirt, donning pants, pulling up socks and drying back, min v.c Therapist reviewed big movements with ADLs and issued handout. Arm bike x 5  mins level 1 for conditioning, pt maintained 40 rpm              OT Short Term Goals - 09/09/19 0827      OT SHORT TERM GOAL #1   Title  I with PD specific HEP.    Time  4    Period  Weeks    Status  New    Target Date  10/01/19      OT SHORT TERM GOAL #2   Title  Pt will demonstrate improved fine motor coordination fro ADLs as evidenced by decreasing 9 hole peg test score to 49 secs or less for RUE.    Baseline  RUE 53.87 secs, LUE 53.87    Time  4    Period  Weeks    Status  New      OT SHORT TERM GOAL #3   Title  Pt will verbalize understanding of adapted strategies to maximize safety and I with ADLs/ IADLs.    Time  4    Period  Weeks    Status  New      OT SHORT TERM GOAL #4   Title  Pt will increase  RUE grip strength by 5 lbs for increased functional use during ADLs.    Baseline  RUE 48.5, LUE 73.8    Time  4    Period  Weeks    Status  New        OT Long Term Goals - 09/01/19 1033      OT LONG TERM GOAL #1   Title  Pt will verbalize understanding of ways to prevent future PD related complications and PD community resources.    Time  8    Period  Weeks    Status  New    Target Date  10/31/19      OT LONG TERM GOAL #2   Title  Pt will demonstrate improved ease with fastening buttons as evidenced by decreasing 3 button/ unbutton score to 44 secs or less    Baseline  48.06    Time  8    Period  Weeks    Status  New      OT LONG TERM GOAL #3   Title  Pt will demonstrate ability to retrieve a lightweight object with LUE at 125 shoulder flexion    Baseline  LUE 120    Time  8    Period  Weeks    Status  New      OT LONG TERM GOAL #4   Title  Pt will demonstrate ability to retrieve a lightweight object from overhead shelf with -15 elbow extension with RUE    Baseline  RUE -20 elbow extension    Time  8    Period  Weeks    Status  New            Plan - 09/23/19 1313    Clinical Impression Statement  Pt is progressing towards goals. He requires v.c to slow down during functional activity. He loses his balance backwards.    OT Occupational Profile and History  Problem Focused Assessment - Including review of records relating to presenting problem    Occupational performance deficits (Please refer to evaluation for details):  ADL's;IADL's;Work;Play;Leisure;Social Participation    Body Structure / Function / Physical Skills  ADL;Balance;Mobility;Strength;Flexibility;UE functional use;FMC;Coordination;Decreased knowledge of precautions;GMC;ROM;Sensation;IADL;Dexterity    Cognitive Skills  Memory    Rehab Potential  Good    Clinical Decision Making  Limited treatment options, no task modification necessary    Comorbidities Affecting Occupational Performance:  May have  comorbidities impacting occupational performance    Modification or Assistance to Complete Evaluation   No modification of tasks or assist necessary to complete eval    OT Frequency  2x / week   anticipate d/c after 6 weeks   OT Duration  8 weeks    OT Treatment/Interventions  Self-care/ADL training;Therapeutic exercise;Aquatic Therapy;Ultrasound;Neuromuscular education;Manual Therapy;Therapeutic activities;Paraffin;DME and/or AE instruction;Cognitive remediation/compensation;Moist Heat;Passive range of motion;Patient/family education;Fluidtherapy    Plan  continue to address ADL strategies and stepping backwards.    OT Home Exercise Plan  issued PWR! seated and modified quadraped for PWR! up and rock, green putty    Consulted and Agree with Plan of Care  Patient       Patient will benefit from skilled therapeutic intervention in order to improve the following deficits and impairments:   Body Structure / Function / Physical Skills: ADL, Balance, Mobility, Strength, Flexibility, UE functional use, FMC, Coordination, Decreased knowledge of precautions, GMC, ROM, Sensation, IADL, Dexterity Cognitive Skills: Memory     Visit Diagnosis: Other lack of coordination  Stiffness of left shoulder, not elsewhere classified  Other symptoms and signs involving the nervous system  Abnormal posture  Muscle weakness (generalized)  Unsteadiness on feet    Problem List There are no problems to display for this patient.   Jim Carson 09/23/2019, 1:15 PM  [facility] [address] [place], [place], [address] Phone: [phone]   Fax:  [phone]  Name: Jim Carson MRN: [id number] Date of Birth: 01-30-1951

## 2019-09-23 NOTE — Therapy (Signed)
[facility] [address] [place], [place], [address] Phone: [phone]   Fax:  [phone]  Speech Language Pathology Treatment  Patient Details  Name: Jim Carson MRN: [id number] Date of Birth: [birth year] Referring Provider (SLP): Modena Nunnery., PA   Encounter Date: 09/23/2019  End of Session - 09/23/19 1419    Visit Number  7    Number of Visits  17    Date for SLP Re-Evaluation  11/29/19    SLP Start Time  0851    SLP Stop Time   0931    SLP Time Calculation (min)  40 min    Activity Tolerance  Patient tolerated treatment well       Past Medical History:  Diagnosis Date  . Abnormal gait    due to parkinson's,  uses cane  . Allergic rhinitis   . Benign essential hypertension   . Chronic constipation   . Deviated septum   . GERD (gastroesophageal reflux disease)   . History of multiple concussions    per pt as teen playing football, no residual  . Hypothyroidism    followed by pcp  . Left shoulder pain   . OA (osteoarthritis)   . OSA on CPAP   . Parkinson disease [facility])    neurologist-- dr b. scott  @[facility]  Neurology  . Vitamin B12 deficiency   . Vitamin D deficiency   . Wears glasses     Past Surgical History:  Procedure Laterality Date  . APPENDECTOMY  07/1964  . LAPAROSCOPIC CHOLECYSTECTOMY  01-21-2017   @[facility] , [place]  . POSTERIOR FUSION CERVICAL SPINE  08-15-2011   @[facility] , [place]   w/ laminectomy and decompression-- C3 -- T1  . POSTERIOR LAMINECTOMY / DECOMPRESSION LUMBAR SPINE  07-30-2013   @[facility] , [place]   bilateral T11 - 12,  L2 --5  . 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 SHOULDER ARTHROSCOPY WITH ROTATOR CUFF REPAIR Left 12/15/2018   Procedure: SHOULDER ARTHROSCOPY, biceps tenodesis labored Debridement, submacromial decompression;  Surgeon: Sydnee Cabal, MD;  Location: [facility];  Service: Orthopedics;  Laterality:  Left;  interscalene block  . 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  Subjective Assessment - 09/23/19 0904    Subjective  "My wife isn't harping on my as much about being lost."    Currently in Pain?  No/denies            ADULT SLP TREATMENT - 09/23/19 0905      General Information   Behavior/Cognition  Alert;Cooperative;Pleasant mood      Treatment Provided   Treatment provided  Cognitive-Linquistic      Cognitive-Linquistic Treatment   Treatment focused on  Dysarthria    Skilled Treatment  SLP had pt complete loud /a/ and everyday sentences to habitualize louder speech in conversation as well as maintain WNL intonation in conversation. No Laryngeal push with  /a/ and average loud /a/ was low 90s dB, Everdayp sentences produced with mid 70s dB initially, and SLP had pt re-do these and then produced mid-upper 80s dB average. SLP assessed pt's loudness in mod complex and mod to max complex converstion for 23 minutes - pt maintained average volume low 70s dB. Pt has not begun external cues but plans to.      Assessment / Recommendations / Plan   Plan  Continue with current plan  of care      Progression Toward Goals   Progression toward goals  Progressing toward goals         SLP Short Term Goals - 09/23/19 1420      SLP SHORT TERM GOAL #1   Title  pt to maintain loud /a/ for at least 10 seconds with upper 80s -low 90s dB in 5 therapy sessions    Baseline  09-09-19, 09-14-19, 09-17-19, 09-21-19    Status  Achieved      SLP SHORT TERM GOAL #2   Title  pt will produce WNL speech volume in 10 minutes simple conversation in 3 sessions    Baseline  09-14-19, 09-17-19, 09-21-19    Status  Achieved      SLP SHORT TERM GOAL #3   Title  pt will demo abdomoinal breathing in sentence responses 75% of the time in 3 sessions    Time  2    Period  Weeks    Status  On-going       SLP Long Term Goals - 09/23/19 1421       SLP LONG TERM GOAL #1   Title  pt will demo WNL speech volume in 10 minutes mod complex-complex conversation over 3 sessions    Baseline  09-14-19, 09-17-19, 09-23-19    Period  --   or 17 sessions, for all LTGs   Status  Achieved      SLP LONG TERM GOAL #2   Title  pt will demo abdominal breathing75% of the time in 10 minutes simple conversation in 3 sessions    Time  6    Period  Weeks    Status  On-going      SLP LONG TERM GOAL #3   Title  pt will maintain loud /a at low 90s dB average in 4 sessions    Baseline  09-14-19    Time  6    Period  Weeks    Status  On-going       Plan - 09/23/19 1420    Clinical Impression Statement  Pt presents with improving speech volume in conversation. Pt will cont to benefit from skilled ST targeting WNL volume in more complex conversation.    Speech Therapy Frequency  2x / week    Duration  --   8 weeks or 17 total sessions   Treatment/Interventions  Functional tasks;SLP instruction and feedback;Compensatory strategies;Patient/family education;Internal/external aids;Cueing hierarchy;Environmental controls    Potential to Achieve Goals  Good    SLP Home Exercise Plan  provided today    Consulted and Agree with Plan of Care  Patient       Patient will benefit from skilled therapeutic intervention in order to improve the following deficits and impairments:   Dysarthria and anarthria  Dysphagia, oropharyngeal phase    Problem List There are no problems to display for this patient.   [facility] ,[place], [place]  09/23/2019, 2:22 PM  [facility] [address] [place], [place], [address] Phone: [phone]   Fax:  [phone]   Name: Efstratios Nakashima MRN: [id number] Date of Birth: [birth year]

## 2019-09-23 NOTE — Therapy (Signed)
[facility] [address] [place], [place], [address] Phone: [phone]   Fax:  [phone]  Physical Therapy Treatment  Patient Details  Name: Jim Carson MRN: [id number] Date of Birth: [birth year] Referring Provider (PT): Gillermo Murdoch   Encounter Date: 09/23/2019  PT End of Session - 09/23/19 1205    Visit Number  9    Number of Visits  13    Date for PT Re-Evaluation  11/21/19    Authorization Type  Medicare/[employer]-will need 10th visit progress note    PT Start Time  1016    PT Stop Time  1058    PT Time Calculation (min)  42 min    Equipment Utilized During Treatment  Gait belt    Activity Tolerance  Patient tolerated treatment well    Behavior During Therapy  [facility] for tasks assessed/performed       Past Medical History:  Diagnosis Date  . Abnormal gait    due to parkinson's,  uses cane  . Allergic rhinitis   . Benign essential hypertension   . Chronic constipation   . Deviated septum   . GERD (gastroesophageal reflux disease)   . History of multiple concussions    per pt as teen playing football, no residual  . Hypothyroidism    followed by pcp  . Left shoulder pain   . OA (osteoarthritis)   . OSA on CPAP   . Parkinson disease [facility])    neurologist-- dr b. scott  '@[facility]  Neurology  . Vitamin B12 deficiency   . Vitamin D deficiency   . Wears glasses     Past Surgical History:  Procedure Laterality Date  . APPENDECTOMY  07/1964  . LAPAROSCOPIC CHOLECYSTECTOMY  01-21-2017   '@[facility] , [place]  . POSTERIOR FUSION CERVICAL SPINE  08-15-2011   '@[facility] , [place]   w/ laminectomy and decompression-- C3 -- T1  . POSTERIOR LAMINECTOMY / DECOMPRESSION LUMBAR SPINE  07-30-2013   [facility] , [place]   bilateral T11 - 12,  L2 --5  . 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 SHOULDER ARTHROSCOPY WITH ROTATOR CUFF REPAIR Left 12/15/2018   Procedure: SHOULDER ARTHROSCOPY,  biceps tenodesis labored Debridement, submacromial decompression;  Surgeon: Sydnee Cabal, MD;  Location: [facility];  Service: Orthopedics;  Laterality: Left;  interscalene block  . TOTAL HIP ARTHROPLASTY Left 09/18/2017   '@[facility] , [place]    There were no vitals filed for this visit.  Subjective Assessment - 09/23/19 1018    Subjective  No changes, no falls.    Patient Stated Goals  Pt's goals for therapy are to improve my balance.    Currently in Pain?  No/denies         [facility] PT Assessment - 09/23/19 1021      Ambulation/Gait   Ambulation/Gait  Yes    Ambulation/Gait Assistance  5: Supervision    Ambulation/Gait Assistance Details  throughout session with [facility]    Assistive device  Straight cane    Gait Pattern  Step-through pattern;Decreased step length - right;Decreased step length - left;Decreased hip/knee flexion - right;Decreased hip/knee flexion - left;Decreased dorsiflexion - right;Festinating;Decreased trunk rotation    Ambulation Surface  Level;Indoor      Standardized Balance Assessment   Standardized Balance Assessment  Mini-BESTest      Mini-BESTest   Sit To Stand  Normal: Comes to stand without use of hands and stabilizes independently.    Rise to Toes  <  3 s.    Stand on one leg (left)  Severe: Unable    Stand on one leg (right)  Severe: Unable    Stand on one leg - lowest score  0    Compensatory Stepping Correction - Forward  Normal: Recovers independently with a single, large step (second realignement is allowed).    Compensatory Stepping Correction - Backward  Moderate: More than one step is required to recover equilibrium    Compensatory Stepping Correction - Left Lateral  Normal: Recovers independently with 1 step (crossover or lateral OK)    Compensatory Stepping Correction - Right Lateral  Moderate: Several steps to recover equilibrium    Stepping Corredtion Lateral - lowest score  1    Stance - Feet together, eyes open, firm surface    Normal: 30s    Stance - Feet together, eyes closed, foam surface   Moderate: < 30s    Incline - Eyes Closed  Moderate: Stands independently < 30s OR aligns with surface    Change in Gait Speed  Normal: Significantly changes walkling speed without imbalance    Walk with head turns - Horizontal  Moderate: performs head turns with reduction in gait speed.    Walk with pivot turns  Moderate:Turns with feet close SLOW (>4 steps) with good balance.    Step over obstacles  Normal: Able to step over box with minimal change of gait speed and with good balance.    Timed UP & GO with Dual Task  Normal: No noticeable change in sitting, standing or walking while backward counting when compared to TUG without    Mini-BEST total score  18      Timed Up and Go Test   Normal TUG (seconds)  12.47   11.28 without cane    Cognitive TUG (seconds)  12.28   no AD   TUG Comments  with SPC and no AD                   [facility] Adult PT Treatment/Exercise - 09/23/19 1021      Exercises   Exercises  Other Exercises    Other Exercises   seated heel raises 2 x 10 reps - educated pt to perform intermittently throughout day - at desk or seated watching TV for ankle PF strengthening, pt verbalized understanding          Balance Exercises - 09/23/19 1209      Balance Exercises: Standing   Standing Eyes Closed  Wide (BOA);Foam/compliant surface;5 reps;20 secs;Other (comment);Limitations    Standing Eyes Closed Limitations  pt able to progress reps without using wall for balance, cues to keep a soft knee    Other Standing Exercises  on foam with feet wider and progressing to more narrow BOS, 1 x 10 reps head turns for each position , cues for soft knees    Other Standing Exercises Comments  standing at countertop attempted heel <> toe raises, however pt heavily reliant on BUE on countertop to lift heels        PT Education - 09/23/19 1213    Education Details  progress towards goals, verbally adding  seated heel raises to HEP to perform more frequently throughout the day when sitting    Person(s) Educated  Patient    Methods  Explanation;Demonstration    Comprehension  Verbalized understanding;Returned demonstration       PT Short Term Goals - 09/23/19 1019      PT SHORT TERM GOAL #1  Title  Pt will be independent with HEP to address balance, transfers, and gait.  TARGET 09/24/2019    Baseline  independent with initial HEP    Time  4    Period  Weeks    Status  Achieved      PT SHORT TERM GOAL #2   Title  Pt will improve TUG score to less than or equal to 13.5 sec for decreased fall risk.    Baseline  11.28 seconds with no AD    Time  4    Period  Weeks    Status  Achieved    Target Date  09/24/19      PT SHORT TERM GOAL #3   Title  Pt will improve MiniBESTest score to at least 17/28 for decreased fall risk.    Baseline  18/28 on 09/23/19    Time  4    Period  Weeks    Status  Achieved        PT Long Term Goals - 08/23/19 0902      PT LONG TERM GOAL #1   Title  Pt will be independent with progression of HEP to address balance, gait.  TARGET 10/08/2019    Time  6    Period  Weeks    Status  New      PT LONG TERM GOAL #2   Title  Pt will improve MiniBESTest score to at least 20/28 for decreased fall risk.    Time  6    Period  Weeks    Status  New      PT LONG TERM GOAL #3   Title  Pt will recover posterior LOB in 2 steps or less to improve balance recovery.    Time  6    Period  Weeks    Status  New      PT LONG TERM GOAL #4   Title  Pt will verbalize plans for return to community fitness upon d/c from PT.    Time  6    Period  Weeks    Status  New            Plan - 09/23/19 1214    Clinical Impression Statement  Focus of today's skilled session was assessing pt's STGs. Pt has met all STGs. Pt improved his TUG score with no AD to 11.28 seconds and improved his miniBEST to 18/28, however pt is still at an incr fall risk based on this score. Pt with  improvements in reactive postural control with stepping strategies forward and posteriorly. Pt continues with decr ankle PF strength and tendency to shift posteriorly back onto heels , especially on heels but pt demonstrating improvement with incr reps. Will continue to progress towards LTGs.    Personal Factors and Comorbidities  Comorbidity 3+    Comorbidities  PMH Parkinson's disease, neck surgery, shoulder surgery, back surgery    Examination-Activity Limitations  Locomotion Level;Transfers;Stairs    Examination-Participation Restrictions  Community Activity;Other   Return to [facility] exercise   Stability/Clinical Decision Making  Evolving/Moderate complexity    Rehab Potential  Fair    PT Frequency  2x / week    PT Duration  6 weeks   plus eval   PT Treatment/Interventions  ADLs/Self Care Home Management;Gait training;Stair training;Functional mobility training;Therapeutic activities;Therapeutic exercise;Balance training;Neuromuscular re-education;Patient/family education    PT Next Visit Plan  pt likes boxing - try boxing, multi-directional stepping strategy sheet. balance with eyes closed/and on compliant surfaces, head turns, and  turns. PWR moves for stepping/posture. continue hip/ankle strategy work, step strategies to lessen posterior lean and LOB.    PT Home Exercise Plan  Access Code: [email or web address]    Consulted and Agree with Plan of Care  Patient       Patient will benefit from skilled therapeutic intervention in order to improve the following deficits and impairments:  Abnormal gait, Difficulty walking, Decreased balance, Decreased mobility, Postural dysfunction  Visit Diagnosis: Other lack of coordination  Other symptoms and signs involving the nervous system  Abnormal posture  Muscle weakness (generalized)     Problem List There are no problems to display for this patient.   Arliss Journey, PT, DPT  09/23/2019, 12:19 PM  [facility] [address] [place], [place], [address] Phone: [phone]   Fax:  [phone]  Name: Kimmie Doren MRN: [id number] Date of Birth: 1951-06-23

## 2019-09-23 NOTE — Patient Instructions (Signed)

## 2019-09-28 ENCOUNTER — Ambulatory Visit: Payer: Medicare Other

## 2019-09-28 ENCOUNTER — Other Ambulatory Visit: Payer: Self-pay

## 2019-09-28 ENCOUNTER — Ambulatory Visit: Payer: Medicare Other | Admitting: Physical Therapy

## 2019-09-28 DIAGNOSIS — R1312 Dysphagia, oropharyngeal phase: Secondary | ICD-10-CM

## 2019-09-28 DIAGNOSIS — R471 Dysarthria and anarthria: Secondary | ICD-10-CM

## 2019-09-28 DIAGNOSIS — M6281 Muscle weakness (generalized): Secondary | ICD-10-CM

## 2019-09-28 DIAGNOSIS — R278 Other lack of coordination: Secondary | ICD-10-CM | POA: Diagnosis not present

## 2019-09-28 DIAGNOSIS — R293 Abnormal posture: Secondary | ICD-10-CM

## 2019-09-28 DIAGNOSIS — R29818 Other symptoms and signs involving the nervous system: Secondary | ICD-10-CM

## 2019-09-28 NOTE — Therapy (Signed)
[facility] [address] [place], [place], [address] Phone: [phone]   Fax:  [phone]  Speech Language Pathology Treatment  Patient Details  Name: Jim Carson MRN: [id number] Date of Birth: [birth year] Referring Provider (SLP): Modena Nunnery., PA   Encounter Date: 09/28/2019  End of Session - 09/28/19 1101    Visit Number  8    Number of Visits  17    Date for SLP Re-Evaluation  11/29/19    SLP Start Time  0936    SLP Stop Time   1016    SLP Time Calculation (min)  40 min    Activity Tolerance  Patient tolerated treatment well       Past Medical History:  Diagnosis Date  . Abnormal gait    due to parkinson's,  uses cane  . Allergic rhinitis   . Benign essential hypertension   . Chronic constipation   . Deviated septum   . GERD (gastroesophageal reflux disease)   . History of multiple concussions    per pt as teen playing football, no residual  . Hypothyroidism    followed by pcp  . Left shoulder pain   . OA (osteoarthritis)   . OSA on CPAP   . Parkinson disease [facility])    neurologist-- dr b. scott  '@[facility]'$  Neurology  . Vitamin B12 deficiency   . Vitamin D deficiency   . Wears glasses     Past Surgical History:  Procedure Laterality Date  . APPENDECTOMY  07/1964  . LAPAROSCOPIC CHOLECYSTECTOMY  01-21-2017   '@[facility] , [place]  . POSTERIOR FUSION CERVICAL SPINE  08-15-2011   '@[facility]'$ , [place]   w/ laminectomy and decompression-- C3 -- T1  . POSTERIOR LAMINECTOMY / DECOMPRESSION LUMBAR SPINE  07-30-2013   '@[facility] , [place]   bilateral T11 - 12,  L2 --5  . 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 SHOULDER ARTHROSCOPY WITH ROTATOR CUFF REPAIR Left 12/15/2018   Procedure: SHOULDER ARTHROSCOPY, biceps tenodesis labored Debridement, submacromial decompression;  Surgeon: Sydnee Cabal, MD;  Location: [facility];  Service: Orthopedics;  Laterality:  Left;  interscalene block  . TOTAL HIP ARTHROPLASTY Left 09/18/2017   '@[facility]'$ , [place]    There were no vitals filed for this visit.  Subjective Assessment - 09/28/19 0941    Subjective  "No questions really, it all seems to be going well!"    Currently in Pain?  No/denies            ADULT SLP TREATMENT - 09/28/19 0951      General Information   Behavior/Cognition  Alert;Cooperative;Pleasant mood      Treatment Provided   Treatment provided  Cognitive-Linquistic;Dysphagia      Dysphagia Treatment   Oral Phase Signs & Symptoms  --   none noted   Pharyngeal Phase Signs & Symptoms  --   none noted   Other treatment/comments  Pt performed HEP for swallowing with SBA. SLP talked with pt about taking single sips instead of multiple consecutive sips and "thinking about" each swallow prior to doing it. Pt practiced with this technique without cues necessary - no overt s/sx aspiration noted.       Cognitive-Linquistic Treatment   Treatment focused on  Dysarthria    Skilled Treatment  SLP had pt complete loud /a/ and everyday sentences to habitualize louder speech in conversation as well as maintain WNL intonation in conversation. No Laryngeal push with  /  a/ and average loud /a/ was low 90s dB, Everyday sentences produced mid 80s dB average. SLP assessed pt's loudness in mod complex and mod to max complex converstion for 18 minutes - pt maintained average volume low 70s dB. Abdominal breathing noted aprox 75=80% in conversation.       Assessment / Recommendations / Plan   Plan  Continue with current plan of care   posible decr to once/week?     Progression Toward Goals   Progression toward goals  Progressing toward goals         SLP Short Term Goals - 09/28/19 1103      SLP SHORT TERM GOAL #1   Title  pt to maintain loud /a/ for at least 10 seconds with upper 80s -low 90s dB in 5 therapy sessions    Baseline  09-09-19, 09-14-19, 09-17-19, 09-21-19    Status  Achieved      SLP  SHORT TERM GOAL #2   Title  pt will produce WNL speech volume in 10 minutes simple conversation in 3 sessions    Baseline  09-14-19, 09-17-19, 09-21-19    Status  Achieved      SLP SHORT TERM GOAL #3   Title  pt will demo abdomoinal breathing in sentence responses 75% of the time in 3 sessions    Status  Partially Met       SLP Long Term Goals - 09/28/19 1104      [place] #1   Title  pt will demo WNL speech volume in 10 minutes mod complex-complex conversation over 3 sessions    Baseline  09-14-19, 09-17-19, 09-23-19    Period  --   or 17 sessions, for all LTGs   Status  Achieved      SLP LONG TERM GOAL #2   Title  pt will demo abdominal breathing75% of the time in 10 minutes simple conversation in 3 sessions    Baseline  09-28-19    Time  5    Period  Weeks    Status  On-going      SLP LONG TERM GOAL #3   Title  pt will maintain loud /a at low 90s dB average in 4 sessions    Baseline  09-14-19    Time  5    Period  Weeks    Status  On-going       Plan - 09/28/19 1102    Clinical Impression Statement  Pt presents with improving speech volume in conversation, and is independent with his HEP. Possible decr to once/week the week of 4-5-21Pt will cont to benefit from skilled ST targeting WNL volume in more complex conversation.    Speech Therapy Frequency  2x / week    Duration  --   8 weeks or 17 total sessions   Treatment/Interventions  Functional tasks;SLP instruction and feedback;Compensatory strategies;Patient/family education;Internal/external aids;Cueing hierarchy;Environmental controls    Potential to Achieve Goals  Good    SLP Home Exercise Plan  provided today    Consulted and Agree with Plan of Care  Patient       Patient will benefit from skilled therapeutic intervention in order to improve the following deficits and impairments:   Dysarthria and anarthria  Dysphagia, oropharyngeal phase    Problem List There are no problems to display for this  patient.   [facility] ,[place], [place]  09/28/2019, 11:05 AM  Jim Carson [address] [place], [place], [address] Phone:  [phone]   Fax:  [phone]   Name: Jim Carson MRN: [id number] Date of Birth: 1950-09-13

## 2019-09-28 NOTE — Therapy (Signed)
[facility] [address] [place], [place], [address] Phone: [phone]   Fax:  [phone]  Physical Therapy Treatment/10th Visit Progress Note Patient Details  Name: Jim Carson MRN: [id number] Date of Birth: 1950/10/06 Referring Provider (PT): Gillermo Murdoch  10th Visit Physical Therapy Progress Note  Dates of Reporting Period: 09/07/19 to 09/28/19    Encounter Date: 09/28/2019  PT End of Session - 09/28/19 1441    Visit Number  10    Number of Visits  13    Date for PT Re-Evaluation  11/21/19    Authorization Type  Medicare/AARP-will need 10th visit progress note    PT Start Time  1016    PT Stop Time  1059    PT Time Calculation (min)  43 min    Equipment Utilized During Treatment  Gait belt    Activity Tolerance  Patient tolerated treatment well    Behavior During Therapy  [facility] for tasks assessed/performed       Past Medical History:  Diagnosis Date  . Abnormal gait    due to parkinson's,  uses cane  . Allergic rhinitis   . Benign essential hypertension   . Chronic constipation   . Deviated septum   . GERD (gastroesophageal reflux disease)   . History of multiple concussions    per pt as teen playing football, no residual  . Hypothyroidism    followed by pcp  . Left shoulder pain   . OA (osteoarthritis)   . OSA on CPAP   . Parkinson disease [facility])    neurologist-- dr b. scott  @[facility]  Neurology  . Vitamin B12 deficiency   . Vitamin D deficiency   . Wears glasses     Past Surgical History:  Procedure Laterality Date  . APPENDECTOMY  07/1964  . LAPAROSCOPIC CHOLECYSTECTOMY  01-21-2017   @[facility] , [place]  . POSTERIOR FUSION CERVICAL SPINE  08-15-2011   @[facility] , [place]   w/ laminectomy and decompression-- C3 -- T1  . POSTERIOR LAMINECTOMY / DECOMPRESSION LUMBAR SPINE  07-30-2013   @[facility] , [place]   bilateral T11 - 12,  L2 --5  . 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 SHOULDER ARTHROSCOPY WITH ROTATOR CUFF REPAIR Left 12/15/2018   Procedure: SHOULDER ARTHROSCOPY, biceps tenodesis labored Debridement, submacromial decompression;  Surgeon: Sydnee Cabal, MD;  Location: [facility];  Service: Orthopedics;  Laterality: Left;  interscalene block  . 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  Subjective Assessment - 09/28/19 1019    Subjective  No changes, no falls. Doing good.    Patient Stated Goals  Pt's goals for therapy are to improve my balance.    Currently in Pain?  No/denies                       [facility] Adult PT Treatment/Exercise - 09/28/19 0001      Neuro Re-ed    Neuro Re-ed Details   With multi-directional stepping strategy sheet: pt followed directions for right or left forward/posterior/or lateral stepping with no UE support, multiple reps adding cognitive challenges with no LOB, needed intermittent cues for posture and to return back to midline with wider BOS. At edge of mat with chair with physioball in it: performed standing PWR! UP with boomwhackers - whacking physioball anteteriorly and standing with tall posture 2 x 10  reps, asking pt to count loudly during each rep, pt with a couple of almost LOB posteriorly, but pt able to maintain balance. With punching bag: had pt perform jabs/punches forward and cross body for trunk rotation and with forward stepping and with therapist naming out a number combo for pt to remember, progressed by asking pt to hit the numbers backwards in the order they were given and also saying the numbers out loud.       Knee/Hip Exercises: Aerobic   Other Aerobic  SciFit level 3.5 for 5 minutes with BLE and BUE x5 minutes with rpm >75 for strengthening, ROM, activity tolerance.           Balance Exercises - 09/28/19 2004      Balance Exercises: Standing   Other Standing Exercises  Corner balance on foam: with  wider BOS and eyes closed, couple reps progressing to 30 seconds, with feet hip width distance progressing to 20 seconds x3 reps.          PT Short Term Goals - 09/23/19 1019      PT SHORT TERM GOAL #1   Title  Pt will be independent with HEP to address balance, transfers, and gait.  TARGET 09/24/2019    Baseline  independent with initial HEP    Time  4    Period  Weeks    Status  Achieved      PT SHORT TERM GOAL #2   Title  Pt will improve TUG score to less than or equal to 13.5 sec for decreased fall risk.    Baseline  11.28 seconds with no AD    Time  4    Period  Weeks    Status  Achieved    Target Date  09/24/19      PT SHORT TERM GOAL #3   Title  Pt will improve MiniBESTest score to at least 17/28 for decreased fall risk.    Baseline  18/28 on 09/23/19    Time  4    Period  Weeks    Status  Achieved        PT Long Term Goals - 08/23/19 0902      PT LONG TERM GOAL #1   Title  Pt will be independent with progression of HEP to address balance, gait.  TARGET 10/08/2019    Time  6    Period  Weeks    Status  New      PT LONG TERM GOAL #2   Title  Pt will improve MiniBESTest score to at least 20/28 for decreased fall risk.    Time  6    Period  Weeks    Status  New      PT LONG TERM GOAL #3   Title  Pt will recover posterior LOB in 2 steps or less to improve balance recovery.    Time  6    Period  Weeks    Status  New      PT LONG TERM GOAL #4   Title  Pt will verbalize plans for return to community fitness upon d/c from PT.    Time  6    Period  Weeks    Status  New            Plan - 09/28/19 2006    Clinical Impression Statement  10th visit progress note: Performed miniBEST at last session with pt scoring an 18/28 - indicating that pt is still at an incr fall  risk and continues to have difficulty with stepping strategies posteriorly. Pt improved his TUG score to 11.28 seconds with no AD, indicating a decreased fall risk. During standing PWR! Up at  edge of mat table today, pt with only a couple instances of losing balance posteriorly on his heels, but was able to catch his balance. Focused on eyes closed balance on foam today and able to progress towards feet hip width distance apart, able to hold for approx.. 20 seconds before losing balance posteriorly. Pt is progressing well, will continue to progress towards LTGs.    Personal Factors and Comorbidities  Comorbidity 3+    Comorbidities  PMH Parkinson's disease, neck surgery, shoulder surgery, back surgery    Examination-Activity Limitations  Locomotion Level;Transfers;Stairs    Examination-Participation Restrictions  Community Activity;Other   Return to [facility] exercise   Stability/Clinical Decision Making  Evolving/Moderate complexity    Rehab Potential  Fair    PT Frequency  2x / week    PT Duration  6 weeks   plus eval   PT Treatment/Interventions  ADLs/Self Care Home Management;Gait training;Stair training;Functional mobility training;Therapeutic activities;Therapeutic exercise;Balance training;Neuromuscular re-education;Patient/family education    PT Next Visit Plan  add corner balance to pt's HEP - trying on compliant surface with eyes closed. pt used to go to rock steady boxing - try boxing activities, balance with eyes closed/and on compliant surfaces, head turns, and turns. PWR moves for stepping/posture. continue hip/ankle strategy work, step strategies to lessen posterior lean and LOB.    PT Home Exercise Plan  Access Code: [email or web address]    Consulted and Agree with Plan of Care  Patient       Patient will benefit from skilled therapeutic intervention in order to improve the following deficits and impairments:  Abnormal gait, Difficulty walking, Decreased balance, Decreased mobility, Postural dysfunction  Visit Diagnosis: Other lack of coordination  Other symptoms and signs involving the nervous system  Abnormal posture  Muscle weakness (generalized)     Problem List There  are no problems to display for this patient.   Arliss Journey, PT, DPT  09/28/2019, 8:12 PM  [facility] [address] [place], [place], [address] Phone: [phone]   Fax:  [phone]  Name: Jim Carson MRN: [id number] Date of Birth: October 24, 1950

## 2019-09-30 ENCOUNTER — Ambulatory Visit: Payer: Medicare Other | Admitting: Speech Pathology

## 2019-09-30 ENCOUNTER — Other Ambulatory Visit: Payer: Self-pay

## 2019-09-30 ENCOUNTER — Encounter: Payer: Self-pay | Admitting: Physical Therapy

## 2019-09-30 ENCOUNTER — Ambulatory Visit: Payer: Medicare Other | Attending: Physician Assistant | Admitting: Physical Therapy

## 2019-09-30 DIAGNOSIS — R278 Other lack of coordination: Secondary | ICD-10-CM | POA: Diagnosis present

## 2019-09-30 DIAGNOSIS — R471 Dysarthria and anarthria: Secondary | ICD-10-CM | POA: Insufficient documentation

## 2019-09-30 DIAGNOSIS — R29818 Other symptoms and signs involving the nervous system: Secondary | ICD-10-CM | POA: Diagnosis present

## 2019-09-30 DIAGNOSIS — R2689 Other abnormalities of gait and mobility: Secondary | ICD-10-CM | POA: Diagnosis present

## 2019-09-30 DIAGNOSIS — R2681 Unsteadiness on feet: Secondary | ICD-10-CM | POA: Insufficient documentation

## 2019-09-30 DIAGNOSIS — R293 Abnormal posture: Secondary | ICD-10-CM

## 2019-09-30 DIAGNOSIS — M6281 Muscle weakness (generalized): Secondary | ICD-10-CM | POA: Diagnosis not present

## 2019-09-30 DIAGNOSIS — R1312 Dysphagia, oropharyngeal phase: Secondary | ICD-10-CM | POA: Insufficient documentation

## 2019-09-30 DIAGNOSIS — M25612 Stiffness of left shoulder, not elsewhere classified: Secondary | ICD-10-CM | POA: Diagnosis present

## 2019-09-30 NOTE — Therapy (Signed)
[facility] [address] [place], [place], [address] Phone: [phone]   Fax:  [phone]  Speech Language Pathology Treatment  Patient Details  Name: Jim Carson MRN: [id number] Date of Birth: 09/16/1950 Referring Provider (SLP): Modena Nunnery., PA   Encounter Date: 09/30/2019  End of Session - 09/30/19 1100    Visit Number  9    Number of Visits  17    Date for SLP Re-Evaluation  11/29/19    SLP Start Time  5852    SLP Stop Time   1057    SLP Time Calculation (min)  42 min    Activity Tolerance  Patient tolerated treatment well       Past Medical History:  Diagnosis Date  . Abnormal gait    due to parkinson's,  uses cane  . Allergic rhinitis   . Benign essential hypertension   . Chronic constipation   . Deviated septum   . GERD (gastroesophageal reflux disease)   . History of multiple concussions    per pt as teen playing football, no residual  . Hypothyroidism    followed by pcp  . Left shoulder pain   . OA (osteoarthritis)   . OSA on CPAP   . Parkinson disease [facility])    neurologist-- dr b. scott  '@[facility]  Neurology  . Vitamin B12 deficiency   . Vitamin D deficiency   . Wears glasses     Past Surgical History:  Procedure Laterality Date  . APPENDECTOMY  07/1964  . LAPAROSCOPIC CHOLECYSTECTOMY  01-21-2017   '@[facility] , [place]  . POSTERIOR FUSION CERVICAL SPINE  08-15-2011   '@[facility] , [place]   w/ laminectomy and decompression-- C3 -- T1  . POSTERIOR LAMINECTOMY / DECOMPRESSION LUMBAR SPINE  07-30-2013   [facility] , [place]   bilateral T11 - 12,  L2 --5  . 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 SHOULDER ARTHROSCOPY WITH ROTATOR CUFF REPAIR Left 12/15/2018   Procedure: SHOULDER ARTHROSCOPY, biceps tenodesis labored Debridement, submacromial decompression;  Surgeon: Sydnee Cabal, MD;  Location: [facility];  Service: Orthopedics;  Laterality:  Left;  interscalene block  . TOTAL HIP ARTHROPLASTY Left 09/18/2017   '@[facility]'$ , [place]    There were no vitals filed for this visit.  Subjective Assessment - 09/30/19 0934    Subjective  "Everything in Parkinson's, you have to think about what you're doing."    Currently in Pain?  No/denies            ADULT SLP TREATMENT - 09/30/19 1056      General Information   Behavior/Cognition  Alert;Cooperative;Pleasant mood      Treatment Provided   Treatment provided  Cognitive-Linquistic      Pain Assessment   Pain Assessment  No/denies pain      Cognitive-Linquistic Treatment   Treatment focused on  Dysarthria    Skilled Treatment  Pt greeted SLP with WNL vocal intensity. Worked with pt initially on abdominal breathing, with tactile cue of hands on pt's shoulders initially to decrease chest/shoulder movement and neck tension with inhalation. Pt maintained AB independently at rest following instruction with 90% accuracy. SLP had pt place hand on abdomen with loud /a/ to reinforce abdominal recruitment. No laryngeal push noted with loud /a/ today, and average was low 90s dB. Pt maintained average of low 70s dB in 20 minutes conversation today; he noted intensity dips x2. After 20 minutes, pt required several reminders  to "power up" his voice with abdominal breathing.      Assessment / Recommendations / Plan   Plan  Continue with current plan of care   consider decrease to 1x per week? pt to consider      Progression Toward Goals   Progression toward goals  Progressing toward goals         SLP Short Term Goals - 09/30/19 0938      SLP SHORT TERM GOAL #1   Title  pt to maintain loud /a/ for at least 10 seconds with upper 80s -low 90s dB in 5 therapy sessions    Baseline  09-09-19, 09-14-19, 09-17-19, 09-21-19    Status  Achieved      SLP SHORT TERM GOAL #2   Title  pt will produce WNL speech volume in 10 minutes simple conversation in 3 sessions    Baseline  09-14-19, 09-17-19,  09-21-19    Status  Achieved      SLP SHORT TERM GOAL #3   Title  pt will demo abdomoinal breathing in sentence responses 75% of the time in 3 sessions    Status  Partially Met       SLP Long Term Goals - 09/30/19 0938      SLP LONG TERM GOAL #1   Title  pt will demo WNL speech volume in 10 minutes mod complex-complex conversation over 3 sessions    Baseline  09-14-19, 09-17-19, 09-23-19    Period  --   or 17 sessions, for all LTGs   Status  Achieved      SLP LONG TERM GOAL #2   Title  pt will demo abdominal breathing75% of the time in 10 minutes simple conversation in 3 sessions    Baseline  09-28-19, 09-30-19    Time  4    Period  Weeks    Status  On-going      SLP LONG TERM GOAL #3   Title  pt will maintain loud /a at low 90s dB average in 4 sessions    Baseline  09-14-19    Time  4    Period  Weeks    Status  On-going       Plan - 09/30/19 1101    Clinical Impression Statement  Pt presents with improving speech volume in conversation, and is independent with his HEP. Possible decr to once/week the week of 4-5-21Pt will cont to benefit from skilled ST targeting WNL volume in more complex conversation.    Speech Therapy Frequency  2x / week    Duration  --   8 weeks or 17 total sessions   Treatment/Interventions  Functional tasks;SLP instruction and feedback;Compensatory strategies;Patient/family education;Internal/external aids;Cueing hierarchy;Environmental controls    Potential to Achieve Goals  Good    SLP Home Exercise Plan  provided today    Consulted and Agree with Plan of Care  Patient       Patient will benefit from skilled therapeutic intervention in order to improve the following deficits and impairments:   Dysarthria and anarthria    Problem List There are no problems to display for this patient.  Deneise Lever, [place], CCC-SLP Speech-Language Pathologist   Aliene Altes 09/30/2019, 11:01 AM   [facility] [address] [place], [place], [address] Phone: [phone]   Fax:  [phone]   Name: Jim Carson MRN: [id number] Date of Birth: [birth year]

## 2019-09-30 NOTE — Patient Instructions (Addendum)
Weight Shift: Anterior / Posterior (Righting / Equilibrium)    Slowly shift weight forward, arms back and hips forward over toes, until heels rise off floor. Return to starting position. Shift weight backward, arms forward and hips back over heels, until toes rise off floor. Hold each position ____ seconds. Repeat 10____ times per session. Do 2____ sessions per day.  Copyright  VHI. All rights reserved.   CAN LEAN INTO CORNER WITH HIPS THEN PULL SELF TO UPRIGHT TOWARD CHAIR IN FRONT OF YOU   Access Code: [email or web address]: [email or web address]: 04/01/2021Prepared by: Langley Gauss RobertsonExercises  Alternating Heel Raises - 1 x daily - 5 x weekly - 10 reps - 2 sets  Forward Step Up - 1 x daily - 5 x weekly - 10 reps - 2 sets - 3 sec hold  Side Stepping with Resistance at Thighs - 1 x daily - 5 x weekly - 10 reps - 2 sets  Standing Terminal Knee Extension with Resistance - 1 x daily - 5 x weekly - 10 reps - 2 sets  Wide Stance with Eyes Closed on Foam Pad - 1 x daily - 5 x weekly - 3 sets - 10 seconds hold  Wide Stance with Head Nods on Foam Pad - 1 x daily - 5 x weekly - 2 sets - 10 reps  Wide Stance with Eyes Closed and Head Nods on Foam Pad - 1 x daily - 5 x weekly - 2 sets - 10 reps  Romberg Stance Eyes Closed on Foam Pad - 1 x daily - 5 x weekly - 3 sets - 10 seconds hold

## 2019-09-30 NOTE — Therapy (Signed)
[facility] [address] [place], [place], [address] Phone: [phone]   Fax:  [phone]  Physical Therapy Treatment  Patient Details  Name: Jim Carson MRN: [id number] Date of Birth: 05-06-1951 Referring Provider (PT): Gillermo Murdoch   Encounter Date: 09/30/2019  PT End of Session - 09/30/19 0941    Visit Number  11    Number of Visits  13    Date for PT Re-Evaluation  11/21/19    Authorization Type  Medicare/AARP-will need 10th visit progress note    PT Start Time  0846    PT Stop Time  0930    PT Time Calculation (min)  44 min    Activity Tolerance  Patient tolerated treatment well    Behavior During Therapy  [facility] for tasks assessed/performed       Past Medical History:  Diagnosis Date  . Abnormal gait    due to parkinson's,  uses cane  . Allergic rhinitis   . Benign essential hypertension   . Chronic constipation   . Deviated septum   . GERD (gastroesophageal reflux disease)   . History of multiple concussions    per pt as teen playing football, no residual  . Hypothyroidism    followed by pcp  . Left shoulder pain   . OA (osteoarthritis)   . OSA on CPAP   . Parkinson disease [facility])    neurologist-- dr b. scott  @[facility]  Neurology  . Vitamin B12 deficiency   . Vitamin D deficiency   . Wears glasses     Past Surgical History:  Procedure Laterality Date  . APPENDECTOMY  07/1964  . LAPAROSCOPIC CHOLECYSTECTOMY  01-21-2017   @[facility] , [place]  . POSTERIOR FUSION CERVICAL SPINE  08-15-2011   @[facility] , [place]   w/ laminectomy and decompression-- C3 -- T1  . POSTERIOR LAMINECTOMY / DECOMPRESSION LUMBAR SPINE  07-30-2013   @[facility] , [place]   bilateral T11 - 12,  L2 --5  . 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 SHOULDER ARTHROSCOPY WITH ROTATOR CUFF REPAIR Left 12/15/2018   Procedure: SHOULDER ARTHROSCOPY, biceps tenodesis labored Debridement, submacromial  decompression;  Surgeon: Sydnee Cabal, MD;  Location: [facility];  Service: Orthopedics;  Laterality: Left;  interscalene block  . 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  Subjective Assessment - 09/30/19 0849    Subjective  Denies any falls or changes since last visit.    Patient Stated Goals  Pt's goals for therapy are to improve my balance.    Currently in Pain?  No/denies           [facility] Adult PT Treatment/Exercise - 09/30/19 0001      Ambulation/Gait   Ambulation/Gait  Yes    Ambulation/Gait Assistance  5: Supervision    Ambulation Distance (Feet)  --   AROUND CLINIC FOR ACTIVITIES   Assistive device  Straight cane    Ambulation Surface  Level;Indoor      Neuro Re-ed    Neuro Re-ed Details   In corner with chair in front for support-performed standing on foam feet apart with eyes closed 10 sec x 3 reps, eyes closed head turns/nods x 10 reps.  Feet together eyes closed x 10 sec x 3 reps.  Repeated all standing on foam.  Provided as HEP.  Performed wall bumps in corner for weight shifting balance recovery  x 30 reps.  In // bars for stepping onto/off 4" step with 1 UE support, standing on foam and stepping onto/.off same step then standing on foam stepping onto step and tapping other leg to chair infront of step then returning to original position.  Pt needing 1 UE support.  Standing on rocker board both directions with eyes open and eyes closed for head turns/nods. with 1-2 UE support.      Knee/Hip Exercises: Aerobic   Other Aerobic  Scifit level 3.5 for 5 minutes all 4 extremtities      Ankle Exercises: Standing   Rocker Board  3 minutes;Limitations   pt needing ue support.  working on dorsiflexion/plantarflexi            PT Education - 09/30/19 0940    Education Details  additions to HEP    Person(s) Educated  Patient    Methods  Explanation;Demonstration;Handout    Comprehension   Verbalized understanding;Returned demonstration       PT Short Term Goals - 09/23/19 1019      PT SHORT TERM GOAL #1   Title  Pt will be independent with HEP to address balance, transfers, and gait.  TARGET 09/24/2019    Baseline  independent with initial HEP    Time  4    Period  Weeks    Status  Achieved      PT SHORT TERM GOAL #2   Title  Pt will improve TUG score to less than or equal to 13.5 sec for decreased fall risk.    Baseline  11.28 seconds with no AD    Time  4    Period  Weeks    Status  Achieved    Target Date  09/24/19      PT SHORT TERM GOAL #3   Title  Pt will improve MiniBESTest score to at least 17/28 for decreased fall risk.    Baseline  18/28 on 09/23/19    Time  4    Period  Weeks    Status  Achieved        PT Long Term Goals - 08/23/19 0902      PT LONG TERM GOAL #1   Title  Pt will be independent with progression of HEP to address balance, gait.  TARGET 10/08/2019    Time  6    Period  Weeks    Status  New      PT LONG TERM GOAL #2   Title  Pt will improve MiniBESTest score to at least 20/28 for decreased fall risk.    Time  6    Period  Weeks    Status  New      PT LONG TERM GOAL #3   Title  Pt will recover posterior LOB in 2 steps or less to improve balance recovery.    Time  6    Period  Weeks    Status  New      PT LONG TERM GOAL #4   Title  Pt will verbalize plans for return to community fitness upon d/c from PT.    Time  6    Period  Weeks    Status  New            Plan - 09/30/19 1041    Clinical Impression Statement  Skilled session focused on balance activities on various surfaces along with balance recovery.  Pt continues to have LOB posteriorly with increased posterior lean with mobility.  continue  PT per POC.    Personal Factors and Comorbidities  Comorbidity 3+    Comorbidities  PMH Parkinson's disease, neck surgery, shoulder surgery, back surgery    Examination-Activity Limitations  Locomotion Level;Transfers;Stairs     Examination-Participation Restrictions  Community Activity;Other   Return to [facility] exercise   Stability/Clinical Decision Making  Evolving/Moderate complexity    Rehab Potential  Fair    PT Frequency  2x / week    PT Duration  6 weeks   plus eval   PT Treatment/Interventions  ADLs/Self Care Home Management;Gait training;Stair training;Functional mobility training;Therapeutic activities;Therapeutic exercise;Balance training;Neuromuscular re-education;Patient/family education    PT Next Visit Plan  Ask how corner balance activities went at home. pt used to go to rock steady boxing - try boxing activities, balance with eyes closed/and on compliant surfaces, head turns, and turns. PWR moves for stepping/posture. continue hip/ankle strategy work, step strategies to lessen posterior lean and LOB.    PT Home Exercise Plan  Access Code: [email or web address]    Consulted and Agree with Plan of Care  Patient       Patient will benefit from skilled therapeutic intervention in order to improve the following deficits and impairments:  Abnormal gait, Difficulty walking, Decreased balance, Decreased mobility, Postural dysfunction  Visit Diagnosis: Muscle weakness (generalized)  Abnormal posture  Other abnormalities of gait and mobility     Problem List There are no problems to display for this patient.   Narda Bonds, [place] 09/30/19 10:43 AM Phone: [phone] Fax: [facility] [address] [place], [place], [address] Phone: [phone]   Fax:  [phone]  Name: Marcell Hocevar MRN: [id number] Date of Birth: Oct 29, 1950

## 2019-10-04 ENCOUNTER — Ambulatory Visit: Payer: Medicare Other | Admitting: Physical Therapy

## 2019-10-05 ENCOUNTER — Ambulatory Visit: Payer: Medicare Other | Admitting: Occupational Therapy

## 2019-10-05 ENCOUNTER — Other Ambulatory Visit: Payer: Self-pay

## 2019-10-05 ENCOUNTER — Encounter: Payer: Self-pay | Admitting: Physical Therapy

## 2019-10-05 ENCOUNTER — Ambulatory Visit: Payer: Medicare Other

## 2019-10-05 ENCOUNTER — Ambulatory Visit: Payer: Medicare Other | Admitting: Physical Therapy

## 2019-10-05 DIAGNOSIS — R1312 Dysphagia, oropharyngeal phase: Secondary | ICD-10-CM

## 2019-10-05 DIAGNOSIS — R29818 Other symptoms and signs involving the nervous system: Secondary | ICD-10-CM

## 2019-10-05 DIAGNOSIS — R2689 Other abnormalities of gait and mobility: Secondary | ICD-10-CM

## 2019-10-05 DIAGNOSIS — M6281 Muscle weakness (generalized): Secondary | ICD-10-CM

## 2019-10-05 DIAGNOSIS — M25612 Stiffness of left shoulder, not elsewhere classified: Secondary | ICD-10-CM

## 2019-10-05 DIAGNOSIS — R471 Dysarthria and anarthria: Secondary | ICD-10-CM

## 2019-10-05 DIAGNOSIS — R293 Abnormal posture: Secondary | ICD-10-CM

## 2019-10-05 NOTE — Therapy (Signed)
[facility] [address] [place], [place], [address] Phone: [phone]   Fax:  [phone]  Occupational Therapy Treatment  Patient Details  Name: Jim Carson MRN: [id number] Date of Birth: Apr 01, 1951 Referring Provider (OT): Huel Coventry PA-C   Encounter Date: 10/05/2019  OT End of Session - 10/05/19 0848    Visit Number  9    Number of Visits  17    Date for OT Re-Evaluation  10/30/19    Authorization Type  Medicare/ AA/RP    Authorization - Visit Number  9    Authorization - Number of Visits  10    Progress Note Due on Visit  10    OT Start Time  0847    OT Stop Time  0927    OT Time Calculation (min)  40 min    Activity Tolerance  Patient tolerated treatment well    Behavior During Therapy  [facility] for tasks assessed/performed       Past Medical History:  Diagnosis Date  . Abnormal gait    due to parkinson's,  uses cane  . Allergic rhinitis   . Benign essential hypertension   . Chronic constipation   . Deviated septum   . GERD (gastroesophageal reflux disease)   . History of multiple concussions    per pt as teen playing football, no residual  . Hypothyroidism    followed by pcp  . Left shoulder pain   . OA (osteoarthritis)   . OSA on CPAP   . Parkinson disease [facility])    neurologist-- dr b. scott  @[facility]  Neurology  . Vitamin B12 deficiency   . Vitamin D deficiency   . Wears glasses     Past Surgical History:  Procedure Laterality Date  . APPENDECTOMY  07/1964  . LAPAROSCOPIC CHOLECYSTECTOMY  01-21-2017   @[facility] , [place]  . POSTERIOR FUSION CERVICAL SPINE  08-15-2011   @[facility] , [place]   w/ laminectomy and decompression-- C3 -- T1  . POSTERIOR LAMINECTOMY / DECOMPRESSION LUMBAR SPINE  07-30-2013   @[facility] , [place]   bilateral T11 - 12,  L2 --5  . 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 SHOULDER ARTHROSCOPY WITH ROTATOR CUFF REPAIR Left 12/15/2018    Procedure: SHOULDER ARTHROSCOPY, biceps tenodesis labored Debridement, submacromial decompression;  Surgeon: Sydnee Cabal, MD;  Location: [facility];  Service: Orthopedics;  Laterality: Left;  interscalene block  . 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  Subjective Assessment - 10/05/19 0848    Currently in Pain?  No/denies             Treatment: Arm bike x 5 mins level 1 for conditioning, pt maintained 40 rpm PWR! Hands PWR! Up and step, 10 reps each, therapist checked progress towards short term goals. Grooved pegboard for right and LUE fine motor coordination, min difficulty/v.c.              OT Education - 10/05/19 0902    Education Details  PWR! moves supine, and seated basic 4, 10-20 reps each, min v.c for performance initally, then pt retruned demonstration    Person(s) Educated  Patient    Methods  Explanation;Demonstration;Verbal cues    Comprehension  Verbalized understanding;Verbal cues required;Returned demonstration       OT Short Term Goals - 10/05/19 0848      OT SHORT TERM  GOAL #1   Title  I with PD specific HEP.    Time  4    Period  Weeks    Status  On-going    Target Date  10/01/19      OT SHORT TERM GOAL #2   Title  Pt will demonstrate improved fine motor coordination fro ADLs as evidenced by decreasing 9 hole peg test score to 49 secs or less for RUE.    Baseline  RUE 53.87 secs, LUE 40.75    Time  4    Period  Weeks    Status  Achieved   45.66     OT SHORT TERM GOAL #3   Title  Pt will verbalize understanding of adapted strategies to maximize safety and I with ADLs/ IADLs.    Time  4    Period  Weeks    Status  Achieved   verbalizes understanding     OT SHORT TERM GOAL #4   Title  Pt will increase RUE grip strength by 5 lbs for increased functional use during ADLs.    Baseline  RUE 48.5, LUE 73.8    Time  4    Period  Weeks    Status  New    50.2 secs       OT Long Term Goals - 09/01/19 1033      OT LONG TERM GOAL #1   Title  Pt will verbalize understanding of ways to prevent future PD related complications and PD community resources.    Time  8    Period  Weeks    Status  New    Target Date  10/31/19      OT LONG TERM GOAL #2   Title  Pt will demonstrate improved ease with fastening buttons as evidenced by decreasing 3 button/ unbutton score to 44 secs or less    Baseline  48.06    Time  8    Period  Weeks    Status  New      OT LONG TERM GOAL #3   Title  Pt will demonstrate ability to retrieve a lightweight object with LUE at 125 shoulder flexion    Baseline  LUE 120    Time  8    Period  Weeks    Status  New      OT LONG TERM GOAL #4   Title  Pt will demonstrate ability to retrieve a lightweight object from overhead shelf with -15 elbow extension with RUE    Baseline  RUE -20 elbow extension    Time  8    Period  Weeks    Status  New            Plan - 10/05/19 1217    Clinical Impression Statement  Pt is progressing towards goals with improving RUE fine motor coordination.    OT Occupational Profile and History  Problem Focused Assessment - Including review of records relating to presenting problem    Occupational performance deficits (Please refer to evaluation for details):  ADL's;IADL's;Work;Play;Leisure;Social Participation    Body Structure / Function / Physical Skills  ADL;Balance;Mobility;Strength;Flexibility;UE functional use;FMC;Coordination;Decreased knowledge of precautions;GMC;ROM;Sensation;IADL;Dexterity    Cognitive Skills  Memory    Rehab Potential  Good    Clinical Decision Making  Limited treatment options, no task modification necessary    Comorbidities Affecting Occupational Performance:  May have comorbidities impacting occupational performance    Modification or Assistance to Complete Evaluation   No modification of tasks or  assist necessary to complete eval    OT Frequency   2x / week   anticipate d/c after 6 weeks   OT Duration  8 weeks    OT Treatment/Interventions  Self-care/ADL training;Therapeutic exercise;Aquatic Therapy;Ultrasound;Neuromuscular education;Manual Therapy;Therapeutic activities;Paraffin;DME and/or AE instruction;Cognitive remediation/compensation;Moist Heat;Passive range of motion;Patient/family education;Fluidtherapy    Plan  continue to address ADL strategies and stepping backwards.    OT Home Exercise Plan  issued PWR! seated and modified quadraped for PWR! up and rock, green putty    Consulted and Agree with Plan of Care  Patient       Patient will benefit from skilled therapeutic intervention in order to improve the following deficits and impairments:   Body Structure / Function / Physical Skills: ADL, Balance, Mobility, Strength, Flexibility, UE functional use, FMC, Coordination, Decreased knowledge of precautions, GMC, ROM, Sensation, IADL, Dexterity Cognitive Skills: Memory     Visit Diagnosis: Muscle weakness (generalized)  Abnormal posture  Other abnormalities of gait and mobility  Other symptoms and signs involving the nervous system  Stiffness of left shoulder, not elsewhere classified    Problem List There are no problems to display for this patient.   Lynessa Almanzar 10/05/2019, 12:21 PM  [place] [address] [place], [place], [address] Phone: [phone]   Fax:  [phone]  Name: Nayib Hornbaker MRN: [id number] Date of Birth: 06-Nov-1950

## 2019-10-05 NOTE — Therapy (Signed)
[facility] [address] [place], [place], [address] Phone: [phone]   Fax:  [phone]  Physical Therapy Treatment  Patient Details  Name: Jim Carson MRN: [id number] Date of Birth: 1951/01/05 Referring Provider (PT): Gillermo Murdoch   Encounter Date: 10/05/2019  PT End of Session - 10/05/19 1223    Visit Number  12    Number of Visits  13    Date for PT Re-Evaluation  11/21/19    Authorization Type  Medicare/AARP-will need 10th visit progress note    PT Start Time  1018    PT Stop Time  1100    PT Time Calculation (min)  42 min    Activity Tolerance  Patient tolerated treatment well    Behavior During Therapy  [facility] for tasks assessed/performed       Past Medical History:  Diagnosis Date  . Abnormal gait    due to parkinson's,  uses cane  . Allergic rhinitis   . Benign essential hypertension   . Chronic constipation   . Deviated septum   . GERD (gastroesophageal reflux disease)   . History of multiple concussions    per pt as teen playing football, no residual  . Hypothyroidism    followed by pcp  . Left shoulder pain   . OA (osteoarthritis)   . OSA on CPAP   . Parkinson disease [facility])    neurologist-- dr b. scott  _0  Neurology  . Vitamin B12 deficiency   . Vitamin D deficiency   . Wears glasses     Past Surgical History:  Procedure Laterality Date  . APPENDECTOMY  07/1964  . LAPAROSCOPIC CHOLECYSTECTOMY  01-21-2017   _1 , [place]  . POSTERIOR FUSION CERVICAL SPINE  08-15-2011   _2 , [place]   w/ laminectomy and decompression-- C3 -- T1  . POSTERIOR LAMINECTOMY / DECOMPRESSION LUMBAR SPINE  07-30-2013   _3 , [place]   bilateral T11 - 12,  L2 --5  . 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 SHOULDER ARTHROSCOPY WITH ROTATOR CUFF REPAIR Left 12/15/2018   Procedure: SHOULDER ARTHROSCOPY, biceps tenodesis labored Debridement, submacromial  decompression;  Surgeon: Sydnee Cabal, MD;  Location: [facility];  Service: Orthopedics;  Laterality: Left;  interscalene block  . TOTAL HIP ARTHROPLASTY Left 09/18/2017   _5 , [place]    There were no vitals filed for this visit.  Subjective Assessment - 10/05/19 1022    Subjective  Denies any falls or changes.  Found a cushion he can use for exercises at home instead of pillows.    Currently in Pain?  No/denies          [facility] Adult PT Treatment/Exercise - 10/05/19 0001      Transfers   Transfers  Sit to Stand;Stand to Sit    Sit to Stand  6: Modified independent (Device/Increase time);Without upper extremity assist;From chair/3-in-1;From bed    Stand to Sit  6: Modified independent (Device/Increase time)      Ambulation/Gait   Ambulation/Gait  Yes    Ambulation/Gait Assistance  5: Supervision    Ambulation Distance (Feet)  --   around gym for activities   Assistive device  Straight cane    Ambulation Surface  Level;Indoor      Self-Care   Self-Care  Other Self-Care Comments    Other Self-Care Comments   Discussed optimal community fitness.  Going to resume going to [facility] (gets on the bike first  for 45 minutes then does weights for legs and arms).  Pt plans to go to [facility] at least 3 days a week.  Discussed needing to add a walking program to his routine.  Also discussed importance of HEP.  See pt education for optimal community fitness program.      Exercises   Exercises  Other Exercises    Other Exercises   See pt instructions for details of portion of HEP that was reviewed/perfomed during todays session      Knee/Hip Exercises: Aerobic   Other Aerobic  Scifit level 3.5 all 4 extremities x 11 minutes for strength, flexibility and endurance.               PT Education - 10/05/19 1220    Education Details  Optimal community fitness    Person(s) Educated  Patient    Methods  Explanation;Handout;Verbal cues    Comprehension  Verbalized  understanding       PT Short Term Goals - 09/23/19 1019      PT SHORT TERM GOAL #1   Title  Pt will be independent with HEP to address balance, transfers, and gait.  TARGET 09/24/2019    Baseline  independent with initial HEP    Time  4    Period  Weeks    Status  Achieved      PT SHORT TERM GOAL #2   Title  Pt will improve TUG score to less than or equal to 13.5 sec for decreased fall risk.    Baseline  11.28 seconds with no AD    Time  4    Period  Weeks    Status  Achieved    Target Date  09/24/19      PT SHORT TERM GOAL #3   Title  Pt will improve MiniBESTest score to at least 17/28 for decreased fall risk.    Baseline  18/28 on 09/23/19    Time  4    Period  Weeks    Status  Achieved        PT Long Term Goals - 10/05/19 1224      PT LONG TERM GOAL #1   Title  Pt will be independent with progression of HEP to address balance, gait.  TARGET 10/08/2019    Time  6    Period  Weeks    Status  New      PT LONG TERM GOAL #2   Title  Pt will improve MiniBESTest score to at least 20/28 for decreased fall risk.    Time  6    Period  Weeks    Status  New      PT LONG TERM GOAL #3   Title  Pt will recover posterior LOB in 2 steps or less to improve balance recovery.    Time  6    Period  Weeks    Status  New      PT LONG TERM GOAL #4   Title  Pt will verbalize plans for return to community fitness upon d/c from PT.    Time  6    Period  Weeks    Status  Achieved            Plan - 10/05/19 1209    Clinical Impression Statement  Pt met LTG #4.  Began checking goals this session to plan for d/c next session per POC.    Personal Factors and Comorbidities  Comorbidity 3+    Comorbidities  PMH Parkinson's disease, neck surgery, shoulder surgery, back surgery    Examination-Activity Limitations  Locomotion Level;Transfers;Stairs    Examination-Participation Restrictions  Community Activity;Other   Return to [facility] exercise   Stability/Clinical Decision Making   Evolving/Moderate complexity    Rehab Potential  Fair    PT Frequency  2x / week    PT Duration  6 weeks   plus eval   PT Treatment/Interventions  ADLs/Self Care Home Management;Gait training;Stair training;Functional mobility training;Therapeutic activities;Therapeutic exercise;Balance training;Neuromuscular re-education;Patient/family education    PT Next Visit Plan  Finish checking goals and d/c per POC.    PT Home Exercise Plan  Access Code: [email or web address]    Consulted and Agree with Plan of Care  Patient       Patient will benefit from skilled therapeutic intervention in order to improve the following deficits and impairments:  Abnormal gait, Difficulty walking, Decreased balance, Decreased mobility, Postural dysfunction  Visit Diagnosis: Muscle weakness (generalized)  Abnormal posture  Other abnormalities of gait and mobility     Problem List There are no problems to display for this patient.   Narda Bonds, [place] 10/05/19 12:27 PM Phone: [phone] Fax: [place] [address] [place], [place], [address] Phone: [phone]   Fax:  [phone]  Name: Heaton Sarin MRN: [id number] Date of Birth: Sep 04, 1950

## 2019-10-05 NOTE — Therapy (Signed)
[facility] [address] [place], [place], [address] Phone: [phone]   Fax:  [phone]  Speech Language Pathology Treatment/Medicare Progress Note  Patient Details  Name: Jim Carson MRN: [id number] Date of Birth: 1950/08/17 Referring Provider (SLP): Modena Nunnery., PA   Encounter Date: 10/05/2019  End of Session - 10/05/19 1010    Visit Number  10    Number of Visits  17    Date for SLP Re-Evaluation  11/29/19    SLP Start Time  0933    SLP Stop Time   9702    SLP Time Calculation (min)  42 min    Activity Tolerance  Patient tolerated treatment well       Past Medical History:  Diagnosis Date  . Abnormal gait    due to parkinson's,  uses cane  . Allergic rhinitis   . Benign essential hypertension   . Chronic constipation   . Deviated septum   . GERD (gastroesophageal reflux disease)   . History of multiple concussions    per pt as teen playing football, no residual  . Hypothyroidism    followed by pcp  . Left shoulder pain   . OA (osteoarthritis)   . OSA on CPAP   . Parkinson disease [facility])    neurologist-- dr b. scott  '@[facility]'$  Neurology  . Vitamin B12 deficiency   . Vitamin D deficiency   . Wears glasses     Past Surgical History:  Procedure Laterality Date  . APPENDECTOMY  07/1964  . LAPAROSCOPIC CHOLECYSTECTOMY  01-21-2017   '@[facility]'$ , [place]  . POSTERIOR FUSION CERVICAL SPINE  08-15-2011   '@[facility]'$ , [place]   w/ laminectomy and decompression-- C3 -- T1  . POSTERIOR LAMINECTOMY / DECOMPRESSION LUMBAR SPINE  07-30-2013   '@[facility] , [place]   bilateral T11 - 12,  L2 --5  . 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 SHOULDER ARTHROSCOPY WITH ROTATOR CUFF REPAIR Left 12/15/2018   Procedure: SHOULDER ARTHROSCOPY, biceps tenodesis labored Debridement, submacromial decompression;  Surgeon: Sydnee Cabal, MD;  Location: [facility];  Service:  Orthopedics;  Laterality: Left;  interscalene block  . TOTAL HIP ARTHROPLASTY Left 09/18/2017   '@[facility]'$ , [place]    There were no vitals filed for this visit.  Subjective Assessment - 10/05/19 0952    Subjective  "We had some company this last weekend - I don't recall anyone asking me to repeat."    Currently in Pain?  No/denies            ADULT SLP TREATMENT - 10/05/19 0953      General Information   Behavior/Cognition  Alert;Cooperative;Pleasant mood      Treatment Provided   Treatment provided  Cognitive-Linquistic      Pain Assessment   Pain Assessment  No/denies pain      Cognitive-Linquistic Treatment   Treatment focused on  Dysarthria    Skilled Treatment  SLP used sound level meter to continually assess pt's loudness in mod complex and mod to max complex converstion for 27 minutes - pt maintained average volume low 70s dB. One rmeinder to incr voice volume after 22 minutes. Abdominal breathing noted aprox 75% today in conversation.       Assessment / Recommendations / Plan   Plan  Continue with current plan of care   posible decr to once/week?        SLP Short Term Goals - 09/30/19 6378  SLP SHORT TERM GOAL #1   Title  pt to maintain loud /a/ for at least 10 seconds with upper 80s -low 90s dB in 5 therapy sessions    Baseline  09-09-19, 09-14-19, 09-17-19, 09-21-19    Status  Achieved      SLP SHORT TERM GOAL #2   Title  pt will produce WNL speech volume in 10 minutes simple conversation in 3 sessions    Baseline  09-14-19, 09-17-19, 09-21-19    Status  Achieved      SLP SHORT TERM GOAL #3   Title  pt will demo abdomoinal breathing in sentence responses 75% of the time in 3 sessions    Status  Partially Met       SLP Long Term Goals - 10/05/19 [place] #1   Title  pt will demo WNL speech volume in 10 minutes mod complex-complex conversation over 3 sessions    Baseline  09-14-19, 09-17-19, 09-23-19    Period  --   or 17 sessions, for  all LTGs   Status  Achieved      SLP LONG TERM GOAL #2   Title  pt will demo abdominal breathing75% of the time in 10 minutes simple conversation in 3 sessions    Baseline  09-28-19, 09-30-19    Status  Achieved      SLP LONG TERM GOAL #3   Title  pt will maintain loud /a at low 90s dB average in 4 sessions    Baseline  09-14-19, 10-05-19    Time  3    Period  Weeks    Status  On-going       Plan - 10/05/19 1019    Clinical Impression Statement  Pt presents with improving speech volume in conversation, and is independent with his HEP. Likely d/c next 1-2 sessions. Pt will cont to benefit from skilled ST targeting WNL volume in more complex conversation.    Speech Therapy Frequency  2x / week    Duration  --   8 weeks or 17 total sessions   Treatment/Interventions  Functional tasks;SLP instruction and feedback;Compensatory strategies;Patient/family education;Internal/external aids;Cueing hierarchy;Environmental controls    Potential to Achieve Goals  Good    SLP Home Exercise Plan  provided today    Consulted and Agree with Plan of Care  Patient       Patient will benefit from skilled therapeutic intervention in order to improve the following deficits and impairments:   Dysarthria and anarthria  Dysphagia, oropharyngeal phase   Speech Therapy Progress Note  Dates of Reporting Period: 08-31-19 to present  Subjective Statement: Pt has been seen for 10 visits for improving voice volume.  Objective Measurements: Pt's conversational volume has improved to near-normal/normal in 20 minutes of conversation.  Goal Update: See goal update above.  Plan: Pt likely d/c'd in next 2-3 visits  Reason Skilled Services are Required: Reinforcement of WNLvoice in conversation.    Problem List There are no problems to display for this patient.   [facility] ,[place], [place]  10/05/2019, 10:21 AM  [facility] [address] [place], [place], [address] Phone: [phone]   Fax:  [phone]   Name: Jim Carson MRN: [id number] Date of Birth: 1951/02/20

## 2019-10-05 NOTE — Patient Instructions (Addendum)
Optimal Fitness Program after Therapy for People with Parkinson's Disease  1)  Therapy Home Exercise Program  -Do these Exercises DAILY as instructed by your therapist  -Big, deliberate effort with exercises  -These exercises are important to perform consistently, even when therapist has  finished, because these therapy  exercises often address your specific Parkinson's difficulties   2)  Walking  -  Work up to walking 3-5 times per week, 20-30 minutes per day  -This can be done at home, driveway, quiet street or an indoor track  -Focus should be on your Best posture, arm swing, step length for your best walking pattern   3)  Aerobic Exercise  -Work up to 3-5 times per week, 30 minutes per day  -This can be stationary bike, seated stepper machine, elliptical machine  -Work up to 7-8/10 intensity during the exercise, at minimal to moderate     Resistance     Access Code: [email or web address] URL: [email or web address] Date: 10/05/2019 Prepared by: Nita Sells  Exercises Alternating Heel Raises - 1 x daily - 5 x weekly - 10 reps - 2 sets Forward Step Up - 1 x daily - 5 x weekly - 10 reps - 2 sets - 3 sec hold Side Stepping with Resistance at Thighs - 1 x daily - 5 x weekly - 10 reps - 2 sets Standing Terminal Knee Extension with Resistance - 1 x daily - 5 x weekly - 10 reps - 2 sets Wide Stance with Head Nods on Foam Pad - 1 x daily - 5 x weekly - 2 sets - 10 reps Wide Stance with Eyes Closed on Foam Pad - 1 x daily - 5 x weekly - 3 sets - 10 seconds hold Wide Stance with Eyes Closed and Head Nods on Foam Pad - 1 x daily - 5 x weekly - 2 sets - 10 reps Romberg Stance Eyes Closed on Foam Pad - 1 x daily - 5 x weekly - 3 sets - 10 seconds hold Seated Heel Toe Raises - 1 x daily - 7 x weekly - 20 reps

## 2019-10-06 ENCOUNTER — Ambulatory Visit: Payer: Medicare Other | Admitting: Physical Therapy

## 2019-10-07 ENCOUNTER — Ambulatory Visit: Payer: Medicare Other | Admitting: Physical Therapy

## 2019-10-07 ENCOUNTER — Encounter: Payer: Self-pay | Admitting: Occupational Therapy

## 2019-10-07 ENCOUNTER — Ambulatory Visit: Payer: Medicare Other | Admitting: Occupational Therapy

## 2019-10-07 ENCOUNTER — Ambulatory Visit: Payer: Medicare Other

## 2019-10-07 ENCOUNTER — Other Ambulatory Visit: Payer: Self-pay

## 2019-10-07 DIAGNOSIS — M6281 Muscle weakness (generalized): Secondary | ICD-10-CM

## 2019-10-07 DIAGNOSIS — R2689 Other abnormalities of gait and mobility: Secondary | ICD-10-CM

## 2019-10-07 DIAGNOSIS — M25612 Stiffness of left shoulder, not elsewhere classified: Secondary | ICD-10-CM

## 2019-10-07 DIAGNOSIS — R293 Abnormal posture: Secondary | ICD-10-CM

## 2019-10-07 DIAGNOSIS — R29818 Other symptoms and signs involving the nervous system: Secondary | ICD-10-CM

## 2019-10-07 DIAGNOSIS — R471 Dysarthria and anarthria: Secondary | ICD-10-CM

## 2019-10-07 DIAGNOSIS — R1312 Dysphagia, oropharyngeal phase: Secondary | ICD-10-CM

## 2019-10-07 DIAGNOSIS — R278 Other lack of coordination: Secondary | ICD-10-CM

## 2019-10-07 DIAGNOSIS — R2681 Unsteadiness on feet: Secondary | ICD-10-CM

## 2019-10-07 NOTE — Therapy (Signed)
[facility] [address] [place], [place], [address] Phone: [phone]   Fax:  [phone]  Physical Therapy Treatment/Discharge Summary  Patient Details  Name: Jim Carson MRN: [id number] Date of Birth: April 05, 1951 Referring Provider (PT): Gillermo Murdoch   Encounter Date: 10/07/2019  PT End of Session - 10/07/19 1158    Visit Number  13    Number of Visits  13    Date for PT Re-Evaluation  11/21/19    Authorization Type  Medicare/AARP-will need 10th visit progress note    PT Start Time  1018    PT Stop Time  1057    PT Time Calculation (min)  39 min    Equipment Utilized During Treatment  Gait belt    Activity Tolerance  Patient tolerated treatment well    Behavior During Therapy  [facility] for tasks assessed/performed       Past Medical History:  Diagnosis Date  . Abnormal gait    due to parkinson's,  uses cane  . Allergic rhinitis   . Benign essential hypertension   . Chronic constipation   . Deviated septum   . GERD (gastroesophageal reflux disease)   . History of multiple concussions    per pt as teen playing football, no residual  . Hypothyroidism    followed by pcp  . Left shoulder pain   . OA (osteoarthritis)   . OSA on CPAP   . Parkinson disease [facility])    neurologist-- dr b. scott  '@[facility]'$  Neurology  . Vitamin B12 deficiency   . Vitamin D deficiency   . Wears glasses     Past Surgical History:  Procedure Laterality Date  . APPENDECTOMY  07/1964  . LAPAROSCOPIC CHOLECYSTECTOMY  01-21-2017   '@[facility]'$ , [place]  . POSTERIOR FUSION CERVICAL SPINE  08-15-2011   '@[facility]'$ , [place]   w/ laminectomy and decompression-- C3 -- T1  . POSTERIOR LAMINECTOMY / DECOMPRESSION LUMBAR SPINE  07-30-2013   [facility] , [place]   bilateral T11 - 12,  L2 --5  . 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 SHOULDER ARTHROSCOPY WITH ROTATOR CUFF REPAIR Left 12/15/2018   Procedure: SHOULDER  ARTHROSCOPY, biceps tenodesis labored Debridement, submacromial decompression;  Surgeon: Sydnee Cabal, MD;  Location: [facility];  Service: Orthopedics;  Laterality: Left;  interscalene block  . TOTAL HIP ARTHROPLASTY Left 09/18/2017   '@[facility]'$ , [place]    There were no vitals filed for this visit.  Subjective Assessment - 10/07/19 1020    Subjective  Going to start going to the [facility] again. No falls. Exercises are going well.    Patient Stated Goals  Pt's goals for therapy are to improve my balance.    Currently in Pain?  No/denies         [facility] PT Assessment - 10/07/19 1022      Mini-BESTest   Sit To Stand  Normal: Comes to stand without use of hands and stabilizes independently.    Rise to Toes  < 3 s.    Stand on one leg (left)  Moderate: < 20 s    Stand on one leg (right)  Severe: Unable    Stand on one leg - lowest score  0    Compensatory Stepping Correction - Forward  Normal: Recovers independently with a single, large step (second realignement is allowed).    Compensatory Stepping Correction - Backward  Moderate: More than one step is required to recover  equilibrium    Compensatory Stepping Correction - Left Lateral  Normal: Recovers independently with 1 step (crossover or lateral OK)    Compensatory Stepping Correction - Right Lateral  Moderate: Several steps to recover equilibrium    Stepping Corredtion Lateral - lowest score  1    Stance - Feet together, eyes open, firm surface   Normal: 30s    Stance - Feet together, eyes closed, foam surface   Moderate: < 30s    Incline - Eyes Closed  Normal: Stands independently 30s and aligns with gravity    Change in Gait Speed  Normal: Significantly changes walkling speed without imbalance    Walk with head turns - Horizontal  Moderate: performs head turns with reduction in gait speed.    Walk with pivot turns  Moderate:Turns with feet close SLOW (>4 steps) with good balance.    Step over obstacles  Normal: Able to  step over box with minimal change of gait speed and with good balance.    Timed UP & GO with Dual Task  Normal: No noticeable change in sitting, standing or walking while backward counting when compared to TUG without    Mini-BEST total score  19                   Pt performs PWR! Moves in standing position x10 reps   PWR! Up for improved posture - at countertop, cues for slower movement and to hold for posture   PWR! Rock for improved weighshifting - at [employer] - cues to "reset" in middle  PWR! Twist for improved trunk rotation - in corner with chair anteriorly, cues to reset in the middle for tall posture and to pivot foot   PWR! Step for improved step initiation    [facility] Adult PT Treatment/Exercise - 10/07/19 1022      Exercises   Exercises  Other Exercises    Other Exercises   verbally reviewed and had pt demo HEP, continued to discuss importance of community fitness and continuing to perform HEP post discharge              PT Education - 10/07/19 1158    Education Details  progress towards goals, reviewed HEP    Person(s) Educated  Patient    Methods  Explanation;Demonstration    Comprehension  Verbalized understanding;Returned demonstration       PT Short Term Goals - 09/23/19 1019      PT SHORT TERM GOAL #1   Title  Pt will be independent with HEP to address balance, transfers, and gait.  TARGET 09/24/2019    Baseline  independent with initial HEP    Time  4    Period  Weeks    Status  Achieved      PT SHORT TERM GOAL #2   Title  Pt will improve TUG score to less than or equal to 13.5 sec for decreased fall risk.    Baseline  11.28 seconds with no AD    Time  4    Period  Weeks    Status  Achieved    Target Date  09/24/19      PT SHORT TERM GOAL #3   Title  Pt will improve MiniBESTest score to at least 17/28 for decreased fall risk.    Baseline  18/28 on 09/23/19    Time  4    Period  Weeks    Status  Achieved        PT Long  Term  Goals - 10/07/19 1021      PT LONG TERM GOAL #1   Title  Pt will be independent with progression of HEP to address balance, gait.  TARGET 10/08/2019    Time  6    Period  Weeks    Status  Achieved      PT LONG TERM GOAL #2   Title  Pt will improve MiniBESTest score to at least 20/28 for decreased fall risk.    Baseline  19/28    Time  6    Period  Weeks    Status  Not Met      PT LONG TERM GOAL #3   Title  Pt will recover posterior LOB in 2 steps or less to improve balance recovery.    Baseline  performed multiple reps - can perform in 2 or 3 steps.    Time  6    Period  Weeks    Status  Partially Met      PT LONG TERM GOAL #4   Title  Pt will verbalize plans for return to community fitness upon d/c from PT.    Baseline  pt plans on a walking program, and is going to join the [facility].    Time  6    Period  Weeks    Status  Achieved         PHYSICAL THERAPY DISCHARGE SUMMARY  Visits from Start of Care: 13  Current functional level related to goals / functional outcomes: See LTGs.   Remaining deficits: Impaired dynamic balance, decr posterior stepping strategy for balance, postural deficits, decr functional LE strength   Education / Equipment: HEP   Plan: Patient agrees to discharge.  Patient goals were partially met. Patient is being discharged due to meeting the stated rehab goals.  ?????         Plan - 10/07/19 1204    Clinical Impression Statement  Focus of today's skilled session was assessing pt's LTGs for discharge. Pt has achieved 2 out of 4 LTGs in regards to HEP and community fitness program. Pt improved miniBEST score to a 19/28, but not quite to goal level. Pt partially met LTG #3 - able to recover posterior LOB in 2 or 3 steps. Reviewed pt's HEP for remainder of session with pt needing verbal cues for PWR moves to perform more slowly due to pt having tendency to rush at times. At this time pt will be discharged from PT - pt in agreement.    Personal  Factors and Comorbidities  Comorbidity 3+    Comorbidities  PMH Parkinson's disease, neck surgery, shoulder surgery, back surgery    Examination-Activity Limitations  Locomotion Level;Transfers;Stairs    Examination-Participation Restrictions  Community Activity;Other   Return to [facility] exercise   Stability/Clinical Decision Making  Evolving/Moderate complexity    Rehab Potential  Fair    PT Frequency  2x / week    PT Duration  6 weeks   plus eval   PT Treatment/Interventions  ADLs/Self Care Home Management;Gait training;Stair training;Functional mobility training;Therapeutic activities;Therapeutic exercise;Balance training;Neuromuscular re-education;Patient/family education    PT Home Exercise Plan  Access Code: [email or web address]    Consulted and Agree with Plan of Care  Patient       Patient will benefit from skilled therapeutic intervention in order to improve the following deficits and impairments:  Abnormal gait, Difficulty walking, Decreased balance, Decreased mobility, Postural dysfunction  Visit Diagnosis: Muscle weakness (generalized)  Abnormal posture  Other abnormalities of gait  and mobility  Other symptoms and signs involving the nervous system     Problem List There are no problems to display for this patient.   Arliss Journey, PT, DPT  10/07/2019, 12:06 PM  [facility] [address] [place], [place], [address] Phone: [phone]   Fax:  [phone]  Name: Jim Carson MRN: [id number] Date of Birth: Feb 04, 1951

## 2019-10-07 NOTE — Therapy (Signed)
[facility] [address] [place], [place], [address] Phone: [phone]   Fax:  [phone]  Speech Language Pathology Treatment  Patient Details  Name: Jim Carson MRN: [id number] Date of Birth: 07/21/1950 Referring Provider (SLP): Modena Nunnery., PA   Encounter Date: 10/07/2019  End of Session - 10/07/19 1644    Visit Number  11    Number of Visits  17    Date for SLP Re-Evaluation  11/29/19    SLP Start Time  0933    SLP Stop Time   1016    SLP Time Calculation (min)  43 min    Activity Tolerance  Patient tolerated treatment well       Past Medical History:  Diagnosis Date  . Abnormal gait    due to parkinson's,  uses cane  . Allergic rhinitis   . Benign essential hypertension   . Chronic constipation   . Deviated septum   . GERD (gastroesophageal reflux disease)   . History of multiple concussions    per pt as teen playing football, no residual  . Hypothyroidism    followed by pcp  . Left shoulder pain   . OA (osteoarthritis)   . OSA on CPAP   . Parkinson disease [facility])    neurologist-- dr b. scott  _0  Neurology  . Vitamin B12 deficiency   . Vitamin D deficiency   . Wears glasses     Past Surgical History:  Procedure Laterality Date  . APPENDECTOMY  07/1964  . LAPAROSCOPIC CHOLECYSTECTOMY  01-21-2017   _1 , [place]  . POSTERIOR FUSION CERVICAL SPINE  08-15-2011   _2 , [place]   w/ laminectomy and decompression-- C3 -- T1  . POSTERIOR LAMINECTOMY / DECOMPRESSION LUMBAR SPINE  07-30-2013   _3 , [place]   bilateral T11 - 12,  L2 --5  . 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 SHOULDER ARTHROSCOPY WITH ROTATOR CUFF REPAIR Left 12/15/2018   Procedure: SHOULDER ARTHROSCOPY, biceps tenodesis labored Debridement, submacromial decompression;  Surgeon: Sydnee Cabal, MD;  Location: [facility];  Service: Orthopedics;  Laterality:  Left;  interscalene block  . TOTAL HIP ARTHROPLASTY Left 09/18/2017   _5 , [place]    There were no vitals filed for this visit.         ADULT SLP TREATMENT - 10/07/19 0946      General Information   Behavior/Cognition  Alert;Cooperative;Pleasant mood      Treatment Provided   Treatment provided  Cognitive-Linquistic      Dysphagia Treatment   Other treatment/comments  Independent with HEP except for pt was taking sips with Caryl Ada - SLP cued pt to take 1-2 drops but no sips and pt did so for remaining reps.      Cognitive-Linquistic Treatment   Treatment focused on  Dysarthria    Skilled Treatment  SLP used sound level meter to continually assess pt's loudness in 25 minutes mod complex and mod to max complex converstion - pt maintained average volume low 70s dB. One reminder after 20 minutes for loudness. Abdominal breathing noted aprox 80% today in conversation. Pt completed dysphagia HEP with independence (above).     Assessment / Recommendations / Plan   Plan  Continue with current plan of care      Progression Toward Goals   Progression toward goals  Progressing toward goals         SLP Short Term Goals - 09/30/19  0938      SLP SHORT TERM GOAL #1   Title  pt to maintain loud /a/ for at least 10 seconds with upper 80s -low 90s dB in 5 therapy sessions    Baseline  09-09-19, 09-14-19, 09-17-19, 09-21-19    Status  Achieved      SLP SHORT TERM GOAL #2   Title  pt will produce WNL speech volume in 10 minutes simple conversation in 3 sessions    Baseline  09-14-19, 09-17-19, 09-21-19    Status  Achieved      SLP SHORT TERM GOAL #3   Title  pt will demo abdomoinal breathing in sentence responses 75% of the time in 3 sessions    Status  Partially Met       SLP Long Term Goals - 10/07/19 0951      SLP LONG TERM GOAL #1   Title  pt will demo WNL speech volume in 10 minutes mod complex-complex conversation over 3 sessions    Baseline  09-14-19, 09-17-19, 09-23-19     Period  --   or 17 sessions, for all LTGs   Status  Achieved      SLP LONG TERM GOAL #2   Title  pt will demo abdominal breathing75% of the time in 10 minutes simple conversation in 3 sessions    Baseline  09-28-19, 09-30-19    Status  Achieved      SLP LONG TERM GOAL #3   Title  pt will maintain loud /a at low 90s dB average in 4 sessions    Baseline  09-14-19, 10-05-19    Status  Partially Met      SLP LONG TERM GOAL #4   Title  pt will perform swallow HEP with modified independence    Time  1    Period  Weeks    Status  Achieved       Plan - 10/07/19 1644    Clinical Impression Statement  Pt presents with normal range speech volume in conversation, and is independent with his HEP. Pt agrees with d/c today. A screen should be completed in 6-8 months.    Treatment/Interventions  Functional tasks;SLP instruction and feedback;Compensatory strategies;Patient/family education;Internal/external aids;Cueing hierarchy;Environmental controls    Potential to Achieve Goals  Good    SLP Home Exercise Plan  provided today    Consulted and Agree with Plan of Care  Patient       Patient will benefit from skilled therapeutic intervention in order to improve the following deficits and impairments:   Dysarthria and anarthria  Dysphagia, oropharyngeal phase   SPEECH THERAPY DISCHARGE SUMMARY  Visits from Start of Care: 11  Current functional level related to goals / functional outcomes: See goals above.   Remaining deficits: None.   Education / Equipment: HEP for dysphagia, loud /a/ and other loudness and pitch HEPs, need to cont these HEPs after d/c, and rationale for this.   Plan: Patient agrees to discharge.  Patient goals were partially met. Patient is being discharged due to meeting the stated rehab goals.  ?????       Problem List There are no problems to display for this patient.   SCHINKE,CARL ,MS, CCC-SLP  10/07/2019, 4:47 PM  [facility] [address] [place], [place], [address] Phone: [phone]   Fax:  [phone]   Name: Jim Carson MRN: [id number] Date of Birth: 07/16/1950 

## 2019-10-07 NOTE — Patient Instructions (Addendum)
Ways to prevent future Parkinson's related complications: 1.   Exercise regularly,  2.   Focus on BIGGER movements during daily activities- really reach overhead, straighten elbows and extend fingers 3.   When dressing or reaching for your seatbelt make sure to use your body to assist while you reach-this can help to minimize stress on the shoulder and reduce the risk of a rotator cuff tear 4.   Swing your arms when you walk! People with PD are at increased risk for frozen shoulder and swinging your arms can reduce this risk. 5. Take your time when you stand up, pause for an moment before walking to get your balance 6. Slow down, moving too quickly can cause falls.

## 2019-10-07 NOTE — Therapy (Signed)
[facility] [address] [place], [place], [address] Phone: [phone]   Fax:  [phone]  Occupational Therapy Treatment  Patient Details  Name: Jim Carson MRN: [id number] Date of Birth: [birth year] Referring Provider (OT): Huel Coventry PA-C   Encounter Date: 10/07/2019   OCCUPATIONAL THERAPY DISCHARGE SUMMARY   Current functional level related to goals / functional outcomes: Pt made excellent overall progress. See goals   Remaining deficits: Decreased coordination, decreased balance, decreased functional mobility, abnormal posture   Education / Equipment: Pt was educated regarding :HEP, ways to prevent future complications, and PD community resources. Pt verbalized understanding of all education. Plan: Patient agrees to discharge.  Patient goals were not met. Patient is being discharged due to being pleased with the current functional level.  ?????                OT End of Session - 10/07/19 0859    Visit Number  10    Number of Visits  17    Date for OT Re-Evaluation  10/30/19    Authorization Type  Medicare/ AA/RP    Authorization - Visit Number  10    Authorization - Number of Visits  10    Progress Note Due on Visit  10    OT Start Time  0847    OT Stop Time  0927    OT Time Calculation (min)  40 min    Activity Tolerance  Patient tolerated treatment well    Behavior During Therapy  [facility] for tasks assessed/performed       Past Medical History:  Diagnosis Date  . Abnormal gait    due to parkinson's,  uses cane  . Allergic rhinitis   . Benign essential hypertension   . Chronic constipation   . Deviated septum   . GERD (gastroesophageal reflux disease)   . History of multiple concussions    per pt as teen playing football, no residual  . Hypothyroidism    followed by pcp  . Left shoulder pain   . OA (osteoarthritis)   . OSA on CPAP   . Parkinson disease [facility])    neurologist-- dr b. scott  '@[facility]'$  Neurology  . Vitamin B12 deficiency   . Vitamin D deficiency   . Wears glasses     Past Surgical History:  Procedure Laterality Date  . APPENDECTOMY  07/1964  . LAPAROSCOPIC CHOLECYSTECTOMY  01-21-2017   '@[facility]'$ , [place]  . POSTERIOR FUSION CERVICAL SPINE  08-15-2011   '@[facility]'$ , [place]   w/ laminectomy and decompression-- C3 -- T1  . POSTERIOR LAMINECTOMY / DECOMPRESSION LUMBAR SPINE  07-30-2013   [facility] , [place]   bilateral T11 - 12,  L2 --5  . 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 SHOULDER ARTHROSCOPY WITH ROTATOR CUFF REPAIR Left 12/15/2018   Procedure: SHOULDER ARTHROSCOPY, biceps tenodesis labored Debridement, submacromial decompression;  Surgeon: Sydnee Cabal, MD;  Location: [facility];  Service: Orthopedics;  Laterality: Left;  interscalene block  . TOTAL HIP ARTHROPLASTY Left 09/18/2017   '@[facility]'$ , [place]    There were no vitals filed for this visit.  Subjective Assessment - 10/07/19 0859    Currently in Pain?  No/denies           Treatment:PWR! Basic 4 seated 10 reps each as warmup Arm bike x 5 mins level 1 for conditioning, pt maintained 40 rpm Therapist provided education regarding ways to prevent future  PD related complications and community resources. Pt verbalized understanding. Therapist checked progress towards goals.                  OT Short Term Goals - 10/07/19 0901      OT SHORT TERM GOAL #1   Title  I with PD specific HEP.    Time  4    Period  Weeks    Status  Achieved    Target Date  10/01/19      OT SHORT TERM GOAL #2   Title  Pt will demonstrate improved fine motor coordination fro ADLs as evidenced by decreasing 9 hole peg test score to 49 secs or less for RUE.    Baseline  RUE 53.87 secs, LUE 40.75    Time  4    Period  Weeks    Status  Achieved   45.66     OT SHORT TERM GOAL #3   Title  Pt will verbalize understanding of  adapted strategies to maximize safety and I with ADLs/ IADLs.    Time  4    Period  Weeks    Status  Achieved   verbalizes understanding     OT SHORT TERM GOAL #4   Title  Pt will increase RUE grip strength by 5 lbs for increased functional use during ADLs.    Baseline  RUE 48.5, LUE 73.8    Time  4    Period  Weeks    Status  Not Met   50.2 lbs       OT Long Term Goals - 10/07/19 7048      OT LONG TERM GOAL #1   Title  Pt will verbalize understanding of ways to prevent future PD related complications and PD community resources.    Time  8    Period  Weeks    Status  Achieved      OT LONG TERM GOAL #2   Title  Pt will demonstrate improved ease with fastening buttons as evidenced by decreasing 3 button/ unbutton score to 44 secs or less    Baseline  48.06    Time  8    Period  Weeks    Status  Achieved   29.81     OT LONG TERM GOAL #3   Title  Pt will demonstrate ability to retrieve a lightweight object with LUE at 125 shoulder flexion    Baseline  LUE 120    Time  8    Period  Weeks    Status  Achieved   125     OT LONG TERM GOAL #4   Title  Pt will demonstrate ability to retrieve a lightweight object from overhead shelf with -15 elbow extension with RUE    Baseline  RUE -20 elbow extension    Time  8    Period  Weeks    Status  Achieved   -15           Plan - 10/07/19 0900    Clinical Impression Statement  For the reporting period 08/31/19-10/07/19 pt demonstrates good overall progress. Pt agrees with plans for d/c see progress towards goals.    OT Occupational Profile and History  Problem Focused Assessment - Including review of records relating to presenting problem    Occupational performance deficits (Please refer to evaluation for details):  ADL's;IADL's;Work;Play;Leisure;Social Participation    Body Structure / Function / Physical Skills  ADL;Balance;Mobility;Strength;Flexibility;UE functional use;FMC;Coordination;Decreased knowledge of  precautions;GMC;ROM;Sensation;IADL;Dexterity  Cognitive Skills  Memory    Rehab Potential  Good    Clinical Decision Making  Limited treatment options, no task modification necessary    Comorbidities Affecting Occupational Performance:  May have comorbidities impacting occupational performance    Modification or Assistance to Complete Evaluation   No modification of tasks or assist necessary to complete eval    OT Frequency  2x / week   anticipate d/c after 6 weeks   OT Duration  8 weeks    OT Treatment/Interventions  Self-care/ADL training;Therapeutic exercise;Aquatic Therapy;Ultrasound;Neuromuscular education;Manual Therapy;Therapeutic activities;Paraffin;DME and/or AE instruction;Cognitive remediation/compensation;Moist Heat;Passive range of motion;Patient/family education;Fluidtherapy    Plan  d/c OT, screen in 6 mons    OT Home Exercise Plan  issued PWR! seated and modified quadraped for PWR! up and rock, green putty    Consulted and Agree with Plan of Care  Patient       Patient will benefit from skilled therapeutic intervention in order to improve the following deficits and impairments:   Body Structure / Function / Physical Skills: ADL, Balance, Mobility, Strength, Flexibility, UE functional use, FMC, Coordination, Decreased knowledge of precautions, GMC, ROM, Sensation, IADL, Dexterity Cognitive Skills: Memory     Visit Diagnosis: Muscle weakness (generalized)  Abnormal posture  Other abnormalities of gait and mobility  Other symptoms and signs involving the nervous system  Stiffness of left shoulder, not elsewhere classified  Other lack of coordination  Unsteadiness on feet    Problem List There are no problems to display for this patient.   Hendry Speas 10/07/2019, 1:05 PM  [facility] [address] [place], [place], [address] Phone: [phone]   Fax:  [phone]  Name: Jim Carson MRN:  [id number] Date of Birth: 1950/12/09

## 2020-01-03 ENCOUNTER — Encounter: Payer: Self-pay | Admitting: Physical Therapy

## 2020-04-13 ENCOUNTER — Ambulatory Visit: Payer: Medicare Other | Admitting: Physical Therapy

## 2020-04-13 ENCOUNTER — Other Ambulatory Visit: Payer: Self-pay

## 2020-04-13 ENCOUNTER — Encounter: Payer: Self-pay | Admitting: Occupational Therapy

## 2020-04-13 ENCOUNTER — Ambulatory Visit: Payer: Medicare Other | Attending: Family Medicine | Admitting: Occupational Therapy

## 2020-04-13 ENCOUNTER — Ambulatory Visit: Payer: Medicare Other | Admitting: Occupational Therapy

## 2020-04-13 ENCOUNTER — Ambulatory Visit: Payer: Medicare Other

## 2020-04-13 DIAGNOSIS — M25621 Stiffness of right elbow, not elsewhere classified: Secondary | ICD-10-CM

## 2020-04-13 DIAGNOSIS — R2681 Unsteadiness on feet: Secondary | ICD-10-CM | POA: Diagnosis present

## 2020-04-13 DIAGNOSIS — R293 Abnormal posture: Secondary | ICD-10-CM | POA: Insufficient documentation

## 2020-04-13 DIAGNOSIS — M6281 Muscle weakness (generalized): Secondary | ICD-10-CM | POA: Diagnosis present

## 2020-04-13 DIAGNOSIS — R2689 Other abnormalities of gait and mobility: Secondary | ICD-10-CM | POA: Diagnosis present

## 2020-04-13 DIAGNOSIS — R278 Other lack of coordination: Secondary | ICD-10-CM | POA: Insufficient documentation

## 2020-04-13 DIAGNOSIS — R29818 Other symptoms and signs involving the nervous system: Secondary | ICD-10-CM | POA: Diagnosis present

## 2020-04-13 NOTE — Therapy (Addendum)
[facility] [address] [place], [place], [address] Phone: [phone]   Fax:  [phone]  Occupational Therapy Evaluation  Patient Details  Name: Jim Carson MRN: [id number] Date of Birth: 1951/06/05 Referring Provider (OT): Dr. Lavena Bullion, [place] [facility]   Encounter Date: 04/13/2020   OT End of Session - 04/13/20 1434    Visit Number 1   Number of Visits 17    Date for OT Re-Evaluation    Authorization Type Medicare/ AA/RP    Authorization - Visit Number 1   Authorization - Number of Visits 10   Progress Note Due on Visit    OT Start Time 0807    OT Stop Time 0845    OT Time Calculation (min) 38 min    Activity Tolerance Patient tolerated treatment well    Behavior During Therapy [facility] for tasks assessed/performed           Past Medical History:  Diagnosis Date  . Abnormal gait    due to parkinson's,  uses cane  . Allergic rhinitis   . Benign essential hypertension   . Chronic constipation   . Deviated septum   . GERD (gastroesophageal reflux disease)   . History of multiple concussions    per pt as teen playing football, no residual  . Hypothyroidism    followed by pcp  . Left shoulder pain   . OA (osteoarthritis)   . OSA on CPAP   . Parkinson disease [facility])    neurologist-- dr b. scott  @[facility]  . Vitamin B12 deficiency   . Vitamin D deficiency   . Wears glasses     Past Surgical History:  Procedure Laterality Date  . APPENDECTOMY  07/1964  . LAPAROSCOPIC CHOLECYSTECTOMY  01-21-2017   @[facility] , [place]  . POSTERIOR FUSION CERVICAL SPINE  08-15-2011   @[facility] , [place]   w/ laminectomy and decompression-- C3 -- T1  . POSTERIOR LAMINECTOMY / DECOMPRESSION LUMBAR SPINE  07-30-2013   @[facility] , [place]   bilateral T11 - 12,  L2 --5  . 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 SHOULDER ARTHROSCOPY WITH ROTATOR CUFF REPAIR Left 12/15/2018     Procedure: SHOULDER ARTHROSCOPY, biceps tenodesis labored Debridement, submacromial decompression;  Surgeon: Sydnee Cabal, MD;  Location: [facility];  Service: Orthopedics;  Laterality: Left;  interscalene block  . 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   Subjective Assessment - 04/13/20 0811    Subjective  Denies pain    Pertinent History Parkinson's Disease, hypothyroidism, hx of multiple concussions, GERD, benign essential HTN, abnormal gait, L THA 2019, removal of T1 paraspinous mass 2017, T11-12 and L2-5 lumbar lami/decompression 2015, cervical fusion C3-T1 2013, hx of shoulder surgery June 2020    Patient Stated Goals play guitar better    Currently in Pain? No/denies             [facility] OT Assessment - 04/13/20 0001      Assessment   Medical Diagnosis Parkinson's disease    Referring Provider (OT) Dr. Lavena Bullion    Onset Date/Surgical Date 03/02/20    Hand Dominance Left      Precautions   Precautions Fall      Balance Screen   Has the patient fallen in the past 6 months Yes    How many times? 1  Has the patient had a decrease in activity level because of a fear of falling?  No    Is the patient reluctant to leave their home because of a fear of falling?  No      Home  Environment   Family/patient expects to be discharged to: Private residence    Living Arrangements Spouse/significant other    Home Access Level entry    Writer      Prior Function   Level of Independence Independent    Leisure playing guitar, has started rock steady boxing, goes to the [employer]      ADL   Eating/Feeding Modified independent    Grooming Modified independent    Upper Body Bathing Modified independent    Lower Body Bathing Modified independent    Upper Body Dressing Increased time   difficulty with button up shirt   Lower Body Dressing Modified independent    Toilet  Transfer Modified independent    Tub/Shower Transfer Modified independent    ADL comments increased time required at times      IADL   Shopping Shops independently for small purchases    Light Housekeeping Performs light daily tasks such as dishwashing, bed making    Meal Prep Able to complete simple cold meal and snack prep   cooks on the grill   Medication Management Is responsible for taking medication in correct dosages at correct time      Mobility   Mobility Status Independent;History of falls      Written Expression   Dominant Hand Left    Handwriting Mild micrographia;100% legible   micrographia with writing longer per pt report     Vision - History   Baseline Vision Wears glasses all the time      Cognition   Overall Cognitive Status Cognition to be further assessed in functional context PRN    Memory Impaired    Memory Impairment Decreased short term memory      Observation/Other Assessments   Physical Performance Test   Yes    Simulated Eating Time (seconds) 12..19    Donning Doffing Jacket Time (seconds) 8.07 secs    Donning Doffing Jacket Comments 3 button/ unbutton 39.37      Posture/Postural Control   Posture/Postural Control Postural limitations    Postural Limitations Rounded Shoulders;Forward head      Sensation   Light Touch Impaired by gross assessment    Semmes Weinstein Monofilament Scale --   Right hand numbness     Coordination   9 Hole Peg Test Right;Left    Right 9 Hole Peg Test 46.19   with 1 drop   Left 9 Hole Peg Test 36.97 secs    Box and Blocks  RUE 56  LUE 57    Coordination right more impaired than left      ROM / Strength   AROM / PROM / Strength AROM      AROM   Overall AROM  Deficits    Overall AROM Comments RUE shoulder flexion 130, elbow extension -20, LUE shoulder flexion 135, elbow extension -10, decreased bilateral supination      Hand Function   Right Hand Grip (lbs) 51.1    Left Hand Grip (lbs) 80.9                                 OT Long Term Goals - 04/13/20 1555  OT LONG TERM GOAL #1   Title I with PD specific HEP    Time 5    Period Weeks    Status New    Target Date 05/18/20      OT LONG TERM GOAL #2   Title Pt will demonstrate improved fine motor coordination for ADLs as evidenced by decreasing 9 hole peg test score for RUE by 3 secs    Baseline RUE 46.19, LUE 36.97    Time 5    Period Weeks    Status New      OT LONG TERM GOAL #3   Title Pt will verbalize understanding of ways to prevent future PD related complications, and to protect shoulder/ back since hx of surgeries and PD community resources.    Time 5    Period Weeks    Status New      OT LONG TERM GOAL #4   Title Pt will increase RUE grip strength by 5 lbs for increased functional use, and guitar playing.    Baseline RUE 51.1 lbs, LUE 80.9 lbs    Time 5    Period Weeks    Status New      OT LONG TERM GOAL #5   Title Pt will improve bilateral standing functional reach to 10 inches or greater for increased safety and I with ADLs    Baseline RUE 8.5, LUE 9 inches    Time 5    Period Weeks    Status New      Long Term Additional Goals   Additional Long Term Goals Yes      OT LONG TERM GOAL #6   Title Pt will improve 3 button unbutton score to 35 secs or less for increased ease with ADLs.    Baseline 39.37 secs    Time 5    Period Weeks    Status New      OT LONG TERM GOAL #7   Title I with adapted strategies for ADLs/IADLS including handwriting    Time 5    Period Weeks    Status New                 Plan - 04/13/20 1425    Clinical Impression Statement Pt is 69 year old male who presents to [facility] with history of Parkinson's disease, who was referred by neurology at recent visit due to fine motor coordination changes in ADLs/ IADLs. Pt presents with the following deficits:decreased fine motor coordination, decreased ROM, decreased strength, decreased balance, bradykinesia,  decreased functional mobility which impedes performance of ADLs/IADls. Pt can benefit from continued skilled occupational therapy to address these deficits in order to maximize pt's safety and I with ADLs / IADLsPMH: Parkinson's Disease, hypothyroidism, hx of multiple concussions, GERD, benign essential HTN, abnormal gait, L THA 2019, removal of T1 paraspinous mass 2017, T11-12 and L2-5 lumbar lami/decompression 2015, cervical fusion C3-T1 2013, hx of shoulder surgery June 2020 with rotator cuff tear.Pt has recently started [employer]. Pt wants to be able to play his guitar better.    OT Occupational Profile and History Problem Focused Assessment - Including review of records relating to presenting problem    Occupational performance deficits (Please refer to evaluation for details): ADL's;IADL's;Work;Play;Leisure;Social Participation    Body Structure / Function / Physical Skills ADL;Balance;Mobility;Strength;Flexibility;UE functional use;FMC;Coordination;Decreased knowledge of precautions;GMC;ROM;Sensation;IADL;Dexterity    Cognitive Skills Memory    Rehab Potential Good    Clinical Decision Making Limited treatment options, no task modification necessary  Comorbidities Affecting Occupational Performance: May have comorbidities impacting occupational performance    Modification or Assistance to Complete Evaluation  No modification of tasks or assist necessary to complete eval    OT Frequency 2x / week   anticipate d/c after 6 weeks   OT Duration 8 weeks    OT Treatment/Interventions Self-care/ADL training;Therapeutic exercise;Aquatic Therapy;Ultrasound;Neuromuscular education;Manual Therapy;Therapeutic activities;Paraffin;DME and/or AE instruction;Cognitive remediation/compensation;Moist Heat;Passive range of motion;Patient/family education;Fluidtherapy    Plan discuss using caution when boxing due to hx of shoulder surgery, fine motor coordination. PWR! moves- pt hx of back and shoulder  surgery    Consulted and Agree with Plan of Care Patient           Patient will benefit from skilled therapeutic intervention in order to improve the following deficits and impairments:   Body Structure / Function / Physical Skills: ADL, Balance, Mobility, Strength, Flexibility, UE functional use, FMC, Coordination, Decreased knowledge of precautions, GMC, ROM, Sensation, IADL, Dexterity Cognitive Skills: Memory     Visit Diagnosis: Other lack of coordination - Plan: Ot plan of care cert/re-cert  Abnormal posture - Plan: Ot plan of care cert/re-cert  Muscle weakness (generalized) - Plan: Ot plan of care cert/re-cert  Other abnormalities of gait and mobility - Plan: Ot plan of care cert/re-cert  Other symptoms and signs involving the nervous system - Plan: Ot plan of care cert/re-cert  Unsteadiness on feet - Plan: Ot plan of care cert/re-cert  Stiffness of right elbow, not elsewhere classified - Plan: Ot plan of care cert/re-cert    Problem List There are no problems to display for this patient.   Kiwanna Spraker 04/14/2020, 1:26 PM Theone Murdoch, OTR/L [phone] Phone: [phone] 1:26 PM 04/14/20 [facility] [address] [place], [place], [address] Phone: [phone]   Fax:  [phone]  Name: Sanchez Hemmer MRN: [id number] Date of Birth: [birth year]

## 2020-05-01 ENCOUNTER — Ambulatory Visit: Payer: Medicare Other | Attending: Family Medicine | Admitting: Occupational Therapy

## 2020-05-01 ENCOUNTER — Encounter: Payer: Self-pay | Admitting: Occupational Therapy

## 2020-05-01 ENCOUNTER — Other Ambulatory Visit: Payer: Self-pay

## 2020-05-01 ENCOUNTER — Ambulatory Visit: Payer: Medicare Other | Admitting: Physical Therapy

## 2020-05-01 DIAGNOSIS — M6281 Muscle weakness (generalized): Secondary | ICD-10-CM

## 2020-05-01 DIAGNOSIS — R293 Abnormal posture: Secondary | ICD-10-CM | POA: Insufficient documentation

## 2020-05-01 DIAGNOSIS — M25612 Stiffness of left shoulder, not elsewhere classified: Secondary | ICD-10-CM | POA: Insufficient documentation

## 2020-05-01 DIAGNOSIS — R29818 Other symptoms and signs involving the nervous system: Secondary | ICD-10-CM

## 2020-05-01 DIAGNOSIS — R278 Other lack of coordination: Secondary | ICD-10-CM | POA: Diagnosis not present

## 2020-05-01 DIAGNOSIS — R2689 Other abnormalities of gait and mobility: Secondary | ICD-10-CM

## 2020-05-01 DIAGNOSIS — M25621 Stiffness of right elbow, not elsewhere classified: Secondary | ICD-10-CM

## 2020-05-01 DIAGNOSIS — R2681 Unsteadiness on feet: Secondary | ICD-10-CM

## 2020-05-01 NOTE — Patient Instructions (Signed)
  °  PWR! Hand Exercises  Then, start with elbows bent and hands closed:  PWR! Hands: Push hands out BIG. Elbows straight, wrists up, fingers open and spread apart BIG.    PWR! Step: Touch index finger to thumb while keeping other fingers straight. Flick fingers out BIG (thumb out/straighten fingers). Repeat with other fingers. (Step your thumb to each finger).   With arms stretched out in front of you (elbows straight), perform the following:   PWR! Rock:  Move wrists up and down [employer]! Twist: Twist palms up and down BIG   ** Make each movement big and deliberate so that you feel the movement.  Perform at least 10 repetitions 1x/day, but perform PWR! Hands throughout the day when you are having trouble using your hands (picking up/manipulating small objects, writing, eating, typing, sewing, buttoning, etc.).   Coordination Exercises  Perform the following exercises for 20 minutes 1 times per day. Perform with both hand(s). Perform using big movements.   Flipping Cards: Place deck of cards on the table. Flip cards over by opening your hand big to grasp and then turn your palm up big, opening hand fully to release.  Deal cards: Hold 1/2 or whole deck in your hand. Use thumb to push card off top of deck with one big push.  Flip card between each finger.  Place card on tabletop. Then flick fingers (extend fingers) powerfully to slide card off table (can have chair/box below table to catch the cards).  Rotate ball with fingertips: Pick up with fingers/thumb and move as much as you can with each turn/movement (clockwise and counter-clockwise).  Ball push ups:  Hold ball in fingertips then bringing ball toward palm and then straightening fingers  Toss ball from one hand to the other: Toss big/high.  Deliberately open with toss and deliberately close hand after catch.  Toss ball in the air and catch with the same hand: Toss big/high.  Deliberately open with toss  and deliberately close hand after catch.  Rotate 2 golf balls in your hand: Both directions.  Pick up coins and stack one at a time: Pick up with big, intentional movements. Do not drag coin to the edge. (5-10 in a stack)  Pick up 5-10 coins one at a time and hold in palm. Then, move coins from palm to fingertips one at time and place in coin bank/container.  Practice writing: Slow down, write big, and focus on forming each letter.

## 2020-05-01 NOTE — Therapy (Signed)
[facility] [address] [place], [place], [address] Phone: [phone]   Fax:  [phone]  Occupational Therapy Treatment  Patient Details  Name: Jim Carson MRN: [id number] Date of Birth: 27-Oct-1950 Referring Provider (OT): Dr. Lavena Bullion   Encounter Date: 05/01/2020   OT End of Session - 05/01/20 0734    Visit Number 2   corrected count   Number of Visits 17    Date for OT Re-Evaluation 06/12/20    Authorization Type Medicare/ AA/RP---watch for KX    Authorization - Visit Number 2    Authorization - Number of Visits 20    Progress Note Due on Visit 10    OT Start Time 0721    OT Stop Time 0800    OT Time Calculation (min) 39 min    Activity Tolerance Patient tolerated treatment well    Behavior During Therapy [facility] for tasks assessed/performed           Past Medical History:  Diagnosis Date  . Abnormal gait    due to parkinson's,  uses cane  . Allergic rhinitis   . Benign essential hypertension   . Chronic constipation   . Deviated septum   . GERD (gastroesophageal reflux disease)   . History of multiple concussions    per pt as teen playing football, no residual  . Hypothyroidism    followed by pcp  . Left shoulder pain   . OA (osteoarthritis)   . OSA on CPAP   . Parkinson disease [facility])    neurologist-- dr b. scott  @[facility]  . Vitamin B12 deficiency   . Vitamin D deficiency   . Wears glasses     Past Surgical History:  Procedure Laterality Date  . APPENDECTOMY  07/1964  . LAPAROSCOPIC CHOLECYSTECTOMY  01-21-2017   @[facility] , [place]  . POSTERIOR FUSION CERVICAL SPINE  08-15-2011   @[facility] , [place]   w/ laminectomy and decompression-- C3 -- T1  . POSTERIOR LAMINECTOMY / DECOMPRESSION LUMBAR SPINE  07-30-2013   @[facility] , [place]   bilateral T11 - 12,  L2 --5  . 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 SHOULDER ARTHROSCOPY WITH ROTATOR CUFF  REPAIR Left 12/15/2018   Procedure: SHOULDER ARTHROSCOPY, biceps tenodesis labored Debridement, submacromial decompression;  Surgeon: Sydnee Cabal, MD;  Location: [facility];  Service: Orthopedics;  Laterality: Left;  interscalene block  . 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   Subjective Assessment - 05/01/20 0725    Subjective  back was stiff this morning so I did some stretches    Pertinent History Parkinson's Disease, hypothyroidism, hx of multiple concussions, GERD, benign essential HTN, abnormal gait, L THA 2019, removal of T1 paraspinous mass 2017, T11-12 and L2-5 lumbar lami/decompression 2015, cervical fusion C3-T1 2013, hx of shoulder surgery June 2020    Patient Stated Goals play guitar better    Currently in Pain? No/denies                     OT Education - 05/01/20 0747    Education Details Reviewed PWR! hands and coordination HEP (from previous) with emphasis/cueing on technique/deliberate large amplitude movements    Person(s) Educated Patient    Methods Explanation;Demonstration;Verbal cues;Handout    Comprehension Verbalized understanding;Returned demonstration;Verbal cues required  OT Short Term Goals - 04/14/20 1327      OT SHORT TERM GOAL #1   Title ---------------------no short term goals      OT SHORT TERM GOAL #2   Status --      OT SHORT TERM GOAL #3   Status --      OT SHORT TERM GOAL #4   Status --             OT Long Term Goals - 04/13/20 1555      OT LONG TERM GOAL #1   Title I with PD specific HEP    Time 5    Period Weeks    Status New    Target Date 05/18/20      OT LONG TERM GOAL #2   Title Pt will demonstrate improved fine motor coordination for ADLs as evidenced by decreasing 9 hole peg test score for RUE by 3 secs    Baseline RUE 46.19, LUE 36.97    Time 5    Period Weeks    Status New      OT LONG TERM GOAL #3   Title  Pt will verbalize understanding of ways to prevent future PD related complications, and to protect shoulder/ back since hx of surgeries and PD community resources.    Time 5    Period Weeks    Status New      OT LONG TERM GOAL #4   Title Pt will increase RUE grip strength by 5 lbs for increased functional use, and guitar playing.    Baseline RUE 51.1 lbs, LUE 80.9 lbs    Time 5    Period Weeks    Status New      OT LONG TERM GOAL #5   Title Pt will improve bilateral standing functional reach to 10 inches or greater for increased safety and I with ADLs    Baseline RUE 8.5, LUE 9 inches    Time 5    Period Weeks    Status New      Long Term Additional Goals   Additional Long Term Goals Yes      OT LONG TERM GOAL #6   Title Pt will improve 3 button unbutton score to 35 secs or less for increased ease with ADLs.    Baseline 39.37 secs    Time 5    Period Weeks    Status New      OT LONG TERM GOAL #7   Title I with adapted strategeis for ADLs?IADLS including handwriting    Time 5    Period Weeks    Status New                 Plan - 05/01/20 7782    Clinical Impression Statement Pt demo good understanding of PWR! hands and coordination HEP after review and min cueing for improved technique, incr effort/deliberate movements, and incr movement amplitude.    OT Occupational Profile and History Problem Focused Assessment - Including review of records relating to presenting problem    Occupational performance deficits (Please refer to evaluation for details): ADL's;IADL's;Work;Play;Leisure;Social Participation    Body Structure / Function / Physical Skills ADL;Balance;Mobility;Strength;Flexibility;UE functional use;FMC;Coordination;Decreased knowledge of precautions;GMC;ROM;Sensation;IADL;Dexterity    Cognitive Skills Memory    Rehab Potential Good    Clinical Decision Making Limited treatment options, no task modification necessary    Comorbidities Affecting Occupational  Performance: May have comorbidities impacting occupational performance    Modification or Assistance to Complete Evaluation  No modification of tasks or assist necessary to complete eval    OT Frequency 2x / week   anticipate d/c after 6 weeks   OT Duration 8 weeks    OT Treatment/Interventions Self-care/ADL training;Therapeutic exercise;Aquatic Therapy;Ultrasound;Neuromuscular education;Manual Therapy;Therapeutic activities;Paraffin;DME and/or AE instruction;Cognitive remediation/compensation;Moist Heat;Passive range of motion;Patient/family education;Fluidtherapy    Plan discuss using caution when boxing due to hx of shoulder surgery/practice techniques, PWR! moves- pt hx of back and shoulder surgery, coordination.   Putty exercises for gross and individual finger extension, IP flex, individual finger pinch.    Consulted and Agree with Plan of Care Patient           Patient will benefit from skilled therapeutic intervention in order to improve the following deficits and impairments:   Body Structure / Function / Physical Skills: ADL, Balance, Mobility, Strength, Flexibility, UE functional use, FMC, Coordination, Decreased knowledge of precautions, GMC, ROM, Sensation, IADL, Dexterity Cognitive Skills: Memory     Visit Diagnosis: Other lack of coordination  Abnormal posture  Other symptoms and signs involving the nervous system  Unsteadiness on feet  Stiffness of right elbow, not elsewhere classified    Problem List There are no problems to display for this patient.   [facility] 05/01/2020, 1:58 PM  [facility] [address] [place], [place], [address] Phone: [phone]   Fax:  [phone]  Name: Clem Wisenbaker MRN: [id number] Date of Birth: 11-13-1950   Vianne Bulls, OTR/L [facility] [address] [place], [place]  [address] [phone] phone [phone] 05/01/20 1:58  PM

## 2020-05-02 NOTE — Therapy (Signed)
[facility] [address] [place], [place], [address] Phone: [phone]   Fax:  [phone]  Physical Therapy Evaluation  Patient Details  Name: Birdie Beveridge MRN: [id number] Date of Birth: 08-08-1950 Referring Provider (PT): Lavena Bullion, MD   Encounter Date: 05/01/2020   PT End of Session - 05/02/20 0656    Visit Number 1    Number of Visits 16    Date for PT Re-Evaluation 32/20/25   90 day cert for 8-week POC   Authorization Type Medicare/AARP-will need 10th visit progress note; KX initiated at eval (as pt was seen 13 visits earlier this year for PT)    PT Start Time 0806    PT Stop Time 0846    PT Time Calculation (min) 40 min    Activity Tolerance Patient tolerated treatment well    Behavior During Therapy [facility] for tasks assessed/performed           Past Medical History:  Diagnosis Date  . Abnormal gait    due to parkinson's,  uses cane  . Allergic rhinitis   . Benign essential hypertension   . Chronic constipation   . Deviated septum   . GERD (gastroesophageal reflux disease)   . History of multiple concussions    per pt as teen playing football, no residual  . Hypothyroidism    followed by pcp  . Left shoulder pain   . OA (osteoarthritis)   . OSA on CPAP   . Parkinson disease [facility])    neurologist-- dr b. scott  @[facility]  . Vitamin B12 deficiency   . Vitamin D deficiency   . Wears glasses     Past Surgical History:  Procedure Laterality Date  . APPENDECTOMY  07/1964  . LAPAROSCOPIC CHOLECYSTECTOMY  01-21-2017   @[facility] , [place]  . POSTERIOR FUSION CERVICAL SPINE  08-15-2011   @[facility] , [place]   w/ laminectomy and decompression-- C3 -- T1  . POSTERIOR LAMINECTOMY / DECOMPRESSION LUMBAR SPINE  07-30-2013   @[facility] , [place]   bilateral T11 - 12,  L2 --5  . 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 SHOULDER ARTHROSCOPY WITH ROTATOR CUFF REPAIR Left  12/15/2018   Procedure: SHOULDER ARTHROSCOPY, biceps tenodesis labored Debridement, submacromial decompression;  Surgeon: Sydnee Cabal, MD;  Location: [facility];  Service: Orthopedics;  Laterality: Left;  interscalene block  . 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    Subjective Assessment - 05/01/20 0808    Subjective Really need to get on my exercises.  Did them for a while, then stopped.  My balance is just not where it was.  Most of the time, it feels like I'm more unsteady than I was.  Have had 1 fall recently-tripped over feet and fell int he kitchen.  Back pain limits sleeping in the bed-I typically have to sleep in the recliner for some of the night.    Patient Stated Goals Pt's goal for therapy is to try to get back balance to where I was.  Want to have more confidence with walking.    Currently in Pain? No/denies              [facility] PT Assessment - 05/01/20 4270      Assessment   Medical Diagnosis Parkinson's disease    Referring Provider (PT) Lavena Bullion, MD    Onset  Date/Surgical Date 04/18/20   order request from OT   Hand Dominance Left    Prior Therapy 2/21-4/21 at this clinic      Precautions   Precautions Fall      Balance Screen   Has the patient fallen in the past 6 months Yes    How many times? 1    Has the patient had a decrease in activity level because of a fear of falling?  Yes    Is the patient reluctant to leave their home because of a fear of falling?  No      Home Environment   Living Environment Private residence    Living Arrangements Spouse/significant other    Available Help at Discharge Family    Type of [place] Access Level entry    [place] Two level;Able to live on main level with bedroom/bathroom   Office is on second level   Alternate Level Stairs-Number of Steps 12    Alternate Level Stairs-Rails [place] - 2  wheels;Kasandra Knudsen - single point      Prior Function   Level of Independence Independent    Leisure playing guitar, has started rock steady boxing, goes to the [employer]      Observation/Other Assessments   Focus on Therapeutic Outcomes (FOTO)  NA      Posture/Postural Control   Posture/Postural Control Postural limitations    Postural Limitations Rounded Shoulders;Forward head      ROM / Strength   AROM / PROM / Strength Strength;AROM      AROM   Overall AROM  Within functional limits for tasks performed   For BLEs in sitting     Strength   Overall Strength Deficits    Strength Assessment Site Hip;Knee;Ankle    Right/Left Hip Right;Left    Right Hip Flexion 5/5    Left Hip Flexion 5/5    Right/Left Knee Right;Left    Right Knee Flexion 5/5    Right Knee Extension 5/5    Left Knee Flexion 5/5    Left Knee Extension 5/5    Right/Left Ankle Right;Left    Right Ankle Dorsiflexion 3+/5    Left Ankle Dorsiflexion 4/5      Transfers   Transfers Sit to Stand;Stand to Sit    Sit to Stand 6: Modified independent (Device/Increase time);Without upper extremity assist;From chair/3-in-1    Five time sit to stand comments  12.09    Stand to Sit 6: Modified independent (Device/Increase time);Without upper extremity assist;To chair/3-in-1      Ambulation/Gait   Ambulation/Gait Yes    Ambulation/Gait Assistance 5: Supervision    Ambulation Distance (Feet) 150 Feet    Assistive device Straight cane    Gait Pattern Step-to pattern;Step-through pattern;Decreased arm swing - left;Decreased step length - right;Decreased step length - left;Shuffle;Trunk flexed;Narrow base of support;Scissoring;Festinating   Very narrow BOS, near scissoring, dec foot clearance turning   Ambulation Surface Level;Indoor    Gait velocity 13.69 sec = 2.39 ft/sec      Standardized Balance Assessment   Standardized Balance Assessment Timed Up and Go Test;Mini-BESTest      Mini-BESTest   Sit To Stand Normal: Comes to  stand without use of hands and stabilizes independently.    Rise to Toes < 3 s.    Stand on one leg (left) Normal: 20 s.    Stand on one leg (right) Moderate: < 20 s   0.65, 1.22  Stand on one leg - lowest score 1    Compensatory Stepping Correction - Forward No step, OR would fall if not caught, OR falls spontaneously.    Compensatory Stepping Correction - Backward No step, OR would fall if not caught, OR falls spontaneously.    Compensatory Stepping Correction - Left Lateral Severe: Falls, or cannot step    Compensatory Stepping Correction - Right Lateral Severe:  Falls, or cannot step    Stepping Corredtion Lateral - lowest score 0    Stance - Feet together, eyes open, firm surface  Normal: 30s    Stance - Feet together, eyes closed, foam surface  Moderate: < 30s   4.43   Incline - Eyes Closed Normal: Stands independently 30s and aligns with gravity    Change in Gait Speed Normal: Significantly changes walkling speed without imbalance    Walk with head turns - Horizontal Severe: performs head turns with imbalance   veers to wall on R side   Walk with pivot turns Moderate:Turns with feet close SLOW (>4 steps) with good balance.   3.06 sec   Step over obstacles Moderate: Steps over box but touches box OR displays cautious behavior by slowing gait.    Timed UP & GO with Dual Task Normal: No noticeable change in sitting, standing or walking while backward counting when compared to TUG without    Mini-BEST total score 14      Timed Up and Go Test   Normal TUG (seconds) 13.69    Manual TUG (seconds) 16.15    Cognitive TUG (seconds) 13.34   uncontrolled descent into sitting   TUG Comments Scores >10% difference indicate increased fall risk/difficulty with dual tasking                      Objective measurements completed on examination: See above findings.               PT Education - 05/02/20 0655    Education Details Eval results, POC    Person(s) Educated  Patient    Methods Explanation    Comprehension Verbalized understanding            PT Short Term Goals - 05/02/20 0713      PT SHORT TERM GOAL #1   Title Pt will be independent with HEP to address balance, transfers, and gait.  TARGET 05/26/2020    Time 4    Period Weeks      PT SHORT TERM GOAL #2   Title Pt will improve TUG manual score to less than or equal to 14 sec for decreased fall risk.    Baseline 16.15 sec    Time 4    Period Weeks    Status New      PT SHORT TERM GOAL #3   Title Pt will improve MiniBESTest score to at least 17/28 for decreased fall risk.    Baseline 14/28 at eval    Time 4    Period Weeks    Status New      PT SHORT TERM GOAL #4   Title Pt will verbalize understanding of fall prevention, tips to reduce freezing/festination with turns and gait.    Time 4    Period Weeks    Status New             PT Long Term Goals - 05/02/20 0715      PT LONG TERM GOAL #1   Title Pt will be independent  with progression of HEP to address balance, gait.  TARGET 05/24/2020    Time 8    Period Weeks    Status New      PT LONG TERM GOAL #2   Title Pt will improve MiniBESTest score to at least 20/28 for decreased fall risk.    Time 8    Period Weeks    Status New      PT LONG TERM GOAL #3   Title Pt will recover posterior LOB in 2 steps or less to improve balance recovery.    Time 8    Period Weeks    Status New      PT LONG TERM GOAL #4   Title Pt will improve gait velocity to at least 2.62 ft/sec for improved gait efficiency in the community.    Time 8    Period Weeks    Status New                  Plan - 05/02/20 0700    Clinical Impression Statement Pt is a 69 year old male who presents to OPPT with history of Parkinson's disease, as well as previous neck, back and hip surgeries.  He has recently had one fall.  He presents today with decreased strength, decreased timing and coordination of gait, decreased safety awareness with  transfers (uncontrolled descent), decreased balance, difficulty with dual tasking (manual), slowed gait velocity in comparison to previous bout of therapy, with pt now a limited community ambulator.  Pt is at high fall risk per TUG manual and MiniBESTest scores.  He is active with [employer] and has gone to [facility] in past.  He will benefit from skilled PT to address the above stated deficits to assist with improving overall functional mobility and decreasing fall risk.    Personal Factors and Comorbidities Comorbidity 3+    Comorbidities GERD, OA,C-spine fusion, post-laminectory lumbar spine;  L THR 2019, L RTC repair 2020    Examination-Activity Limitations Locomotion Level;Transfers;Stand    Examination-Participation Restrictions Community Activity   Community fitness   Stability/Clinical Decision Making Evolving/Moderate complexity    Clinical Decision Making Moderate    Rehab Potential Good    PT Frequency 2x / week    PT Duration 8 weeks   including eval week   PT Treatment/Interventions ADLs/Self Care Home Management;Neuromuscular re-education;Balance training;Therapeutic exercise;Therapeutic activities;Functional mobility training;Gait training;Stair training;Patient/family education;DME Instruction;Manual techniques    PT Next Visit Plan Assess hip abductor/extensor strength (may need to add specific hip strengthening to HEP); work on initiation of HEP-may start with previous HEP when pt was here spring 2021; need to work on balance strategies, compliant surface ex    Consulted and Agree with Plan of Care Patient           Patient will benefit from skilled therapeutic intervention in order to improve the following deficits and impairments:  Abnormal gait, Difficulty walking, Decreased safety awareness, Decreased balance, Decreased mobility, Decreased strength, Postural dysfunction  Visit Diagnosis: Other abnormalities of gait and mobility  Unsteadiness on feet  Other symptoms  and signs involving the nervous system  Abnormal posture  Muscle weakness (generalized)     Problem List There are no problems to display for this patient.   Roiza Wiedel W. 05/02/2020, 7:17 AM  Frazier Butt., PT    [facility] [address] [place], [place], [address] Phone: [phone]   Fax:  [phone]  Name: Vollie Brunty MRN: [id number] Date of Birth: 1951-04-19

## 2020-05-03 ENCOUNTER — Encounter: Payer: Self-pay | Admitting: Physical Therapy

## 2020-05-03 ENCOUNTER — Other Ambulatory Visit: Payer: Self-pay

## 2020-05-03 ENCOUNTER — Ambulatory Visit: Payer: Medicare Other | Admitting: Physical Therapy

## 2020-05-03 ENCOUNTER — Ambulatory Visit: Payer: Medicare Other | Admitting: Occupational Therapy

## 2020-05-03 ENCOUNTER — Encounter: Payer: Self-pay | Admitting: Occupational Therapy

## 2020-05-03 DIAGNOSIS — M25612 Stiffness of left shoulder, not elsewhere classified: Secondary | ICD-10-CM

## 2020-05-03 DIAGNOSIS — R278 Other lack of coordination: Secondary | ICD-10-CM | POA: Diagnosis not present

## 2020-05-03 DIAGNOSIS — R293 Abnormal posture: Secondary | ICD-10-CM

## 2020-05-03 DIAGNOSIS — R2681 Unsteadiness on feet: Secondary | ICD-10-CM

## 2020-05-03 DIAGNOSIS — R29818 Other symptoms and signs involving the nervous system: Secondary | ICD-10-CM

## 2020-05-03 DIAGNOSIS — M25621 Stiffness of right elbow, not elsewhere classified: Secondary | ICD-10-CM

## 2020-05-03 DIAGNOSIS — R2689 Other abnormalities of gait and mobility: Secondary | ICD-10-CM

## 2020-05-03 DIAGNOSIS — M6281 Muscle weakness (generalized): Secondary | ICD-10-CM

## 2020-05-03 NOTE — Patient Instructions (Signed)
1. Grip Strengthening (Resistive Putty)   Squeeze putty using thumb and all fingers. Repeat _10__ times. Do __2__ sessions per day.   2. Roll putty into tube on table and pinch between each finger and thumb x 10 reps each. (can do ring and small finger together)   Extension (Resistive Putty)    Place putty loop around fingers. Stretch loop by opening hand at large knuckles only. Keep thumb still and finger- tips straight. Repeat _10-20___ times. Do ___1_ sessions per day.  Copyright  VHI. All rights reserved.  IP Fisting (Resistive Putty)    Keeping knuckles straight, bend fingertips to squeeze putty. Repeat __10-20__ times. Do _1___ sessions per day.  Copyright  VHI. All rights reserved.    Copyright  VHI. All rights reserved.

## 2020-05-03 NOTE — Therapy (Signed)
[facility] [address] [place], [place], [address] Phone: [phone]   Fax:  [phone]  Physical Therapy Treatment  Patient Details  Name: Jim Carson MRN: [id number] Date of Birth: [birth year] Referring Provider (PT): Lavena Bullion, MD   Encounter Date: 05/03/2020   PT End of Session - 05/03/20 0849    Visit Number 2    Number of Visits 16    Date for PT Re-Evaluation 53/66/44   90 day cert for 8-week POC   Authorization Type Medicare/AARP-will need 10th visit progress note; KX initiated at eval (as pt was seen 13 visits earlier this year for PT)    PT Start Time 0802    PT Stop Time 0845    PT Time Calculation (min) 43 min    Activity Tolerance Patient tolerated treatment well    Behavior During Therapy [facility] for tasks assessed/performed           Past Medical History:  Diagnosis Date  . Abnormal gait    due to parkinson's,  uses cane  . Allergic rhinitis   . Benign essential hypertension   . Chronic constipation   . Deviated septum   . GERD (gastroesophageal reflux disease)   . History of multiple concussions    per pt as teen playing football, no residual  . Hypothyroidism    followed by pcp  . Left shoulder pain   . OA (osteoarthritis)   . OSA on CPAP   . Parkinson disease [facility])    neurologist-- dr b. scott  @[facility]  . Vitamin B12 deficiency   . Vitamin D deficiency   . Wears glasses     Past Surgical History:  Procedure Laterality Date  . APPENDECTOMY  07/1964  . LAPAROSCOPIC CHOLECYSTECTOMY  01-21-2017   @[facility] , [place]  . POSTERIOR FUSION CERVICAL SPINE  08-15-2011   @[facility] , [place]   w/ laminectomy and decompression-- C3 -- T1  . POSTERIOR LAMINECTOMY / DECOMPRESSION LUMBAR SPINE  07-30-2013   @[facility] , [place]   bilateral T11 - 12,  L2 --5  . 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 SHOULDER ARTHROSCOPY WITH ROTATOR CUFF REPAIR Left  12/15/2018   Procedure: SHOULDER ARTHROSCOPY, biceps tenodesis labored Debridement, submacromial decompression;  Surgeon: Sydnee Cabal, MD;  [facility]  Service: Orthopedics;  Laterality: Left;  interscalene block  . 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   Subjective Assessment - 05/03/20 0804    Subjective Went to rock steady boxing yesterday - goes on tues/thurs.    Patient Stated Goals Pt's goal for therapy is to try to get back balance to where I was.  Want to have more confidence with walking.    Currently in Pain? Yes    Pain Score 2     Pain Location Back    Pain Orientation Left    Pain Descriptors / Indicators Sharp    Pain Type Chronic pain    Aggravating Factors  "don't really know", sitting too long    Pain Relieving Factors stretches, taking some aleve              [facility] PT Assessment - 05/03/20 0814      Strength   Right Hip Extension 3+/5    Right Hip ABduction 5/5    Left Hip Extension 3-/5    Left Hip  ABduction 5/5                 Access Code: [email or web address] URL: [email or web address] Date: 05/03/2020 Prepared by: Janann August  Initiated HEP (revised from last bout of therapy with new additions of stepping strategies/hip extensor strengthening)  Exercises Alternating Heel Raises - 1 x daily - 5 x weekly - 10 reps - 2 sets Seated Heel Toe Raises - 1 x daily - 7 x weekly - 20 reps Standing Hip Extension with Counter Support - 1 x daily - 5 x weekly - 2 sets - 10 reps - verbal and demo cues for technique  Standing Balance with Eyes Closed on Foam - 1 x daily - 5 x weekly - 3 sets - 20 hold - with feet hip width distance apart  Alternating Step Backward with Support - 1 x daily - 5 x weekly - 1-2 sets - 10 reps - cues for weight shifting and incr foot clearance and keep space between feet when returning to midline  Side Stepping with Counter  Support - 1 x daily - 5 x weekly - 1-2 sets - 10 reps - lateral step out and in back to midline, cues for incr foot clearance and wider BOS when back to midline  Supine Bridge - 1 x daily - 5 x weekly - 2 sets - 5 reps - cues for technique and breathing                   PT Education - 05/03/20 0849    Education Details initiated HEP (new exercise and previous ones from older bout of therapy)    Person(s) Educated Patient    Methods Explanation;Demonstration;Handout    Comprehension Verbalized understanding;Returned demonstration            PT Short Term Goals - 05/02/20 0713      PT SHORT TERM GOAL #1   Title Pt will be independent with HEP to address balance, transfers, and gait.  TARGET 05/26/2020    Time 4    Period Weeks      PT SHORT TERM GOAL #2   Title Pt will improve TUG manual score to less than or equal to 14 sec for decreased fall risk.    Baseline 16.15 sec    Time 4    Period Weeks    Status New      PT SHORT TERM GOAL #3   Title Pt will improve MiniBESTest score to at least 17/28 for decreased fall risk.    Baseline 14/28 at eval    Time 4    Period Weeks    Status New      PT SHORT TERM GOAL #4   Title Pt will verbalize understanding of fall prevention, tips to reduce freezing/festination with turns and gait.    Time 4    Period Weeks    Status New             PT Long Term Goals - 05/02/20 0715      PT LONG TERM GOAL #1   Title Pt will be independent with progression of HEP to address balance, gait.  TARGET 05/24/2020    Time 8    Period Weeks    Status New      PT LONG TERM GOAL #2   Title Pt will improve MiniBESTest score to at least 20/28 for decreased fall risk.    Time 8    Period Weeks    Status New  PT LONG TERM GOAL #3   Title Pt will recover posterior LOB in 2 steps or less to improve balance recovery.    Time 8    Period Weeks    Status New      PT LONG TERM GOAL #4   Title Pt will improve gait velocity to  at least 2.62 ft/sec for improved gait efficiency in the community.    Time 8    Period Weeks    Status New                 Plan - 05/03/20 0851    Clinical Impression Statement Assessed pt's hip ABD/hip extensor strength - with pt demonstrating decr hip extensor strength B (3+ on R, 3- on L) and B hip ABD strength as 5/5. Focus of today's skilled session was initiating HEP for hip extensor strengthening, balance strategies (stepping and with vision removed). Revised from previous bout of therapy. Will continue to progress towards LTGs.    Personal Factors and Comorbidities Comorbidity 3+    Comorbidities GERD, OA,C-spine fusion, post-laminectory lumbar spine;  L THR 2019, L RTC repair 2020    Examination-Activity Limitations Locomotion Level;Transfers;Stand    Examination-Participation Restrictions Community Activity   Community fitness   Stability/Clinical Decision Making Evolving/Moderate complexity    Rehab Potential Good    PT Frequency 2x / week    PT Duration 8 weeks   including eval week   PT Treatment/Interventions ADLs/Self Care Home Management;Neuromuscular re-education;Balance training;Therapeutic exercise;Therapeutic activities;Functional mobility training;Gait training;Stair training;Patient/family education;DME Instruction;Manual techniques    PT Next Visit Plan review HEP, go over standing PWR, need to work on balance strategies (stepping, vision removed), compliant surface ex    PT Home Exercise Plan [email or web address]    Consulted and Agree with Plan of Care Patient           Patient will benefit from skilled therapeutic intervention in order to improve the following deficits and impairments:  Abnormal gait, Difficulty walking, Decreased safety awareness, Decreased balance, Decreased mobility, Decreased strength, Postural dysfunction  Visit Diagnosis: Unsteadiness on feet  Other abnormalities of gait and mobility  Other symptoms and signs involving the nervous  system  Abnormal posture     Problem List There are no problems to display for this patient.   Arliss Journey, PT, DPT  05/03/2020, 8:54 AM  [facility] [address] [place], [place], [address] Phone: [phone]   Fax:  [phone]  Name: Jim Carson MRN: [id number] Date of Birth: [birth year]

## 2020-05-03 NOTE — Patient Instructions (Signed)
Access Code: [email or web address] URL: [email or web address] Date: 05/03/2020 Prepared by: Janann August  Exercises Alternating Heel Raises - 1 x daily - 5 x weekly - 10 reps - 2 sets Seated Heel Toe Raises - 1 x daily - 7 x weekly - 20 reps Standing Hip Extension with Counter Support - 1 x daily - 5 x weekly - 2 sets - 10 reps Standing Balance with Eyes Closed on Foam - 1 x daily - 5 x weekly - 3 sets - 20 hold Alternating Step Backward with Support - 1 x daily - 5 x weekly - 1-2 sets - 10 reps Side Stepping with Counter Support - 1 x daily - 5 x weekly - 1-2 sets - 10 reps Supine Bridge - 1 x daily - 5 x weekly - 2 sets - 5 reps

## 2020-05-03 NOTE — Therapy (Signed)
[facility] [address] [place], [place], [address] Phone: [phone]   Fax:  [phone]  Occupational Therapy Treatment  Patient Details  Name: Jim Carson MRN: [id number] Date of Birth: 1950/07/11 Referring Provider (OT): Dr. Lavena Bullion   Encounter Date: 05/03/2020   OT End of Session - 05/03/20 0938    Visit Number 3    Number of Visits 17    Date for OT Re-Evaluation 06/12/20    Authorization Type Medicare/ AA/RP---watch for KX- anticipate d/c after 5 weeks    Authorization Time Period week 1/5    Authorization - Visit Number 3    Authorization - Number of Visits 10    Progress Note Due on Visit 10    OT Start Time [phone]    OT Stop Time 1015    OT Time Calculation (min) 39 min    Activity Tolerance Patient tolerated treatment well    Behavior During Therapy [facility] for tasks assessed/performed           Past Medical History:  Diagnosis Date  . Abnormal gait    due to parkinson's,  uses cane  . Allergic rhinitis   . Benign essential hypertension   . Chronic constipation   . Deviated septum   . GERD (gastroesophageal reflux disease)   . History of multiple concussions    per pt as teen playing football, no residual  . Hypothyroidism    followed by pcp  . Left shoulder pain   . OA (osteoarthritis)   . OSA on CPAP   . Parkinson disease [facility])    neurologist-- dr b. scott  @[facility]  [facility]  . Vitamin B12 deficiency   . Vitamin D deficiency   . Wears glasses     Past Surgical History:  Procedure Laterality Date  . APPENDECTOMY  07/1964  . LAPAROSCOPIC CHOLECYSTECTOMY  01-21-2017   @[facility] , [place]  . POSTERIOR FUSION CERVICAL SPINE  08-15-2011   @[facility] , [place]   w/ laminectomy and decompression-- C3 -- T1  . POSTERIOR LAMINECTOMY / DECOMPRESSION LUMBAR SPINE  07-30-2013   @[facility] , [place]   bilateral T11 - 12,  L2 --5  . 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 SHOULDER ARTHROSCOPY WITH ROTATOR CUFF REPAIR Left 12/15/2018   Procedure: SHOULDER ARTHROSCOPY, biceps tenodesis labored Debridement, submacromial decompression;  Surgeon: Sydnee Cabal, MD;  Location: [facility];  Service: Orthopedics;  Laterality: Left;  interscalene block  . 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   Subjective Assessment - 05/03/20 0938    Subjective  Denies pain now, pt reports stiffness in mornings    Pertinent History Parkinson's Disease, hypothyroidism, hx of multiple concussions, GERD, benign essential HTN, abnormal gait, L THA 2019, removal of T1 paraspinous mass 2017, T11-12 and L2-5 lumbar lami/decompression 2015, cervical fusion C3-T1 2013, hx of shoulder surgery June 2020    Patient Stated Goals play guitar better    Currently in Pain? No/denies                 Treatment: Placing grooved pegs into pegboard with RUE for increased fine motor coordination/ in hand manipulation, min v.c               OT Education - 05/03/20 0955    Education Details PWR! up and rock modified quadraped 10 reps each , putty  exercises see pt instructions, recommendation that pt avoids overhead pulldown and weighted overhead press at gym due to risk for injury and hx of rotator cuff repair, discussed using cautiion at [employer] stedy vboxing due to fall rosik and hx of back and rotator cuff surgery and the need to be mindful of this while exercising.    Person(s) Educated Patient    Methods Explanation;Demonstration;Verbal cues;Handout    Comprehension Verbalized understanding;Returned demonstration;Verbal cues required            OT Short Term Goals - 04/14/20 1327      OT SHORT TERM GOAL #1   Title ---------------------no short term goals      OT SHORT TERM GOAL #2   Status --      OT SHORT TERM GOAL #3   Status --      OT SHORT TERM GOAL #4   Status --               OT Long Term Goals - 05/03/20 0939      OT LONG TERM GOAL #1   Title I with PD specific HEP    Time 5    Period Weeks    Status New      OT LONG TERM GOAL #2   Title Pt will demonstrate improved fine motor coordination for ADLs as evidenced by decreasing 9 hole peg test score for RUE by 3 secs    Baseline RUE 46.19, LUE 36.97    Time 5    Period Weeks    Status New      OT LONG TERM GOAL #3   Title Pt will verbalize understanding of ways to prevent future PD related complications, and to protect shoulder/ back since hx of surgeries and PD community resources.    Time 5    Period Weeks    Status New      OT LONG TERM GOAL #4   Title Pt will increase RUE grip strength by 5 lbs for increased functional use, and guitar playing.    Baseline RUE 51.1 lbs, LUE 80.9 lbs    Time 5    Period Weeks    Status New      OT LONG TERM GOAL #5   Title Pt will improve bilateral standing functional reach to 10 inches or greater for increased safety and I with ADLs    Baseline RUE 8.5, LUE 9 inches    Time 5    Period Weeks    Status New      OT LONG TERM GOAL #6   Title Pt will improve 3 button unbutton score to 35 secs or less for increased ease with ADLs.    Baseline 39.37 secs    Time 5    Period Weeks    Status New      OT LONG TERM GOAL #7   Title I with adapted strategeis for ADLs/IADLS including handwriting    Time 5    Period Weeks    Status New                 Plan - 05/03/20 1000    Clinical Impression Statement Pt demonstrates understanding of updates to HEP.    OT Occupational Profile and History Problem Focused Assessment - Including review of records relating to presenting problem    Occupational performance deficits (Please refer to evaluation for details): ADL's;IADL's;Work;Play;Leisure;Social Participation    Body Structure / Function / Physical Skills ADL;Balance;Mobility;Strength;Flexibility;UE functional use;FMC;Coordination;Decreased  knowledge of  precautions;GMC;ROM;Sensation;IADL;Dexterity    Cognitive Skills Memory    Rehab Potential Good    Clinical Decision Making Limited treatment options, no task modification necessary    Comorbidities Affecting Occupational Performance: May have comorbidities impacting occupational performance    Modification or Assistance to Complete Evaluation  No modification of tasks or assist necessary to complete eval    OT Frequency 2x / week   anticipate d/c after 6 weeks   OT Duration 8 weeks    OT Treatment/Interventions Self-care/ADL training;Therapeutic exercise;Aquatic Therapy;Ultrasound;Neuromuscular education;Manual Therapy;Therapeutic activities;Paraffin;DME and/or AE instruction;Cognitive remediation/compensation;Moist Heat;Passive range of motion;Patient/family education;Fluidtherapy    Plan reinforce PWR! up and rock in modified quadraped, work towards unmet goals    Consulted and Agree with Plan of Care Patient           Patient will benefit from skilled therapeutic intervention in order to improve the following deficits and impairments:   Body Structure / Function / Physical Skills: ADL, Balance, Mobility, Strength, Flexibility, UE functional use, FMC, Coordination, Decreased knowledge of precautions, GMC, ROM, Sensation, IADL, Dexterity Cognitive Skills: Memory     Visit Diagnosis: Other lack of coordination  Abnormal posture  Muscle weakness (generalized)  Stiffness of right elbow, not elsewhere classified  Other symptoms and signs involving the nervous system  Unsteadiness on feet  Stiffness of left shoulder, not elsewhere classified    Problem List There are no problems to display for this patient.   Larry Alcock 05/03/2020, 12:27 PM  [facility] [address] [place], [place], [address] Phone: [phone]   Fax:  [phone]  Name: Emmanuel Gruenhagen MRN: [id number] Date of Birth: Feb 12, 1951

## 2020-05-08 ENCOUNTER — Ambulatory Visit: Payer: Medicare Other | Admitting: Physical Therapy

## 2020-05-08 ENCOUNTER — Encounter: Payer: Medicare Other | Admitting: Occupational Therapy

## 2020-05-10 ENCOUNTER — Ambulatory Visit: Payer: Medicare Other | Admitting: Physical Therapy

## 2020-05-10 ENCOUNTER — Encounter: Payer: Medicare Other | Admitting: Occupational Therapy

## 2020-05-12 ENCOUNTER — Other Ambulatory Visit: Payer: Self-pay

## 2020-05-12 ENCOUNTER — Encounter: Payer: Self-pay | Admitting: Physical Therapy

## 2020-05-12 ENCOUNTER — Ambulatory Visit: Payer: Medicare Other | Admitting: Physical Therapy

## 2020-05-12 DIAGNOSIS — R29818 Other symptoms and signs involving the nervous system: Secondary | ICD-10-CM

## 2020-05-12 DIAGNOSIS — R2689 Other abnormalities of gait and mobility: Secondary | ICD-10-CM

## 2020-05-12 DIAGNOSIS — R293 Abnormal posture: Secondary | ICD-10-CM

## 2020-05-12 DIAGNOSIS — R278 Other lack of coordination: Secondary | ICD-10-CM | POA: Diagnosis not present

## 2020-05-12 DIAGNOSIS — R2681 Unsteadiness on feet: Secondary | ICD-10-CM

## 2020-05-12 NOTE — Therapy (Addendum)
[facility] [address] [place], [place], [address] Phone: [phone]   Fax:  [phone]  Physical Therapy Treatment  Patient Details  Name: Jim Carson MRN: [id number] Date of Birth: Jun 18, 1951 Referring Provider (PT): Lavena Bullion, MD   Encounter Date: 05/12/2020   PT End of Session - 05/12/20 0929    Visit Number 3    Number of Visits 16    Date for PT Re-Evaluation 44/31/54   90 day cert for 8-week POC   Authorization Type Medicare/AARP-will need 10th visit progress note; KX initiated at eval (as pt was seen 13 visits earlier this year for PT)    PT Start Time 0846    PT Stop Time 0927    PT Time Calculation (min) 41 min    Equipment Utilized During Treatment Gait belt    Activity Tolerance Patient tolerated treatment well    Behavior During Therapy [facility] for tasks assessed/performed           Past Medical History:  Diagnosis Date  . Abnormal gait    due to parkinson's,  uses cane  . Allergic rhinitis   . Benign essential hypertension   . Chronic constipation   . Deviated septum   . GERD (gastroesophageal reflux disease)   . History of multiple concussions    per pt as teen playing football, no residual  . Hypothyroidism    followed by pcp  . Left shoulder pain   . OA (osteoarthritis)   . OSA on CPAP   . Parkinson disease [facility])    neurologist-- dr b. scott  @[facility]  [facility]  . Vitamin B12 deficiency   . Vitamin D deficiency   . Wears glasses     Past Surgical History:  Procedure Laterality Date  . APPENDECTOMY  07/1964  . LAPAROSCOPIC CHOLECYSTECTOMY  01-21-2017   @[facility] , [place]  . POSTERIOR FUSION CERVICAL SPINE  08-15-2011   @[facility] , [place]   w/ laminectomy and decompression-- C3 -- T1  . POSTERIOR LAMINECTOMY / DECOMPRESSION LUMBAR SPINE  07-30-2013   @[facility] , [place]   bilateral T11 - 12,  L2 --5  . 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  SHOULDER ARTHROSCOPY WITH ROTATOR CUFF REPAIR Left 12/15/2018   Procedure: SHOULDER ARTHROSCOPY, biceps tenodesis labored Debridement, submacromial decompression;  Surgeon: Sydnee Cabal, MD;  Location: [facility];  Service: Orthopedics;  Laterality: Left;  interscalene block  . 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   Subjective Assessment - 05/12/20 0849    Subjective No falls. Went to rock steady boxing yesterday - did a special workout for veteran's day. New exercises are going well.    Patient Stated Goals Pt's goal for therapy is to try to get back balance to where I was.  Want to have more confidence with walking.    Currently in Pain? No/denies                         Pt performs PWR! Moves in standing position - at countertop (with exception of trunk rotation)   PWR! Up for improved posture x10 reps - with BUE support for mini squat and cues for proper squat technique as pt with tendency to have knees go too far anteriorly, cues for hand placement   PWR! Rock for improved weighshifting x5 reps weight  shifting through hips and x10 reps reaching up towards counter with UE support, cues to slow down movement as pt tends to perform too quickly at times  PWR! Twist for improved trunk rotation - in corner x10 reps B, cues to perform in corner at home and to have chair in front for balance support  PWR! Step for improved step initiation - cues to hold onto counter for incr foot clearance, x10 reps B.   Cues provided at times to slow down as pt with tendency to perform too quickly.             Balance Exercises - 05/12/20 0916      Balance Exercises: Standing   Standing Eyes Opened Narrow base of support (BOS);Foam/compliant surface;Limitations    Standing Eyes Opened Limitations with feet slightly apart, 2 x 10 reps head turns, 2 x 10 reps head nods    Standing Eyes Closed Wide  (BOA);Foam/compliant surface;4 reps;20 secs   on blue air ex   Wall Bumps Hip;Eyes opened;Limitations    Wall Bumps Limitations x10 reps on level ground, x10 reps air ex cues for technique    Stepping Strategy Lateral;UE support;10 reps;Limitations    Stepping Strategy Limitations on blue mat, cues for incr foot clearance and keeping space between feet    Retro Gait 5 reps;Foam/compliant surface;Limitations    Retro Gait Limitations on blue mat, 3 reps UE support, 3 reps no UE support with min A at times for balance    Marching Forwards;5 reps;Limitations    Marching Limitations with UE support, on blue mat, cues for slowed and controlled and incr foot clearance             PT Education - 05/12/20 0929    Education Details reviewed standing PWR moves at countertop from previous HEP    Person(s) Educated Patient    Methods Explanation;Demonstration;Verbal cues    Comprehension Verbalized understanding;Returned demonstration            PT Short Term Goals - 05/02/20 0713      PT SHORT TERM GOAL #1   Title Pt will be independent with HEP to address balance, transfers, and gait.  TARGET 05/26/2020    Time 4    Period Weeks      PT SHORT TERM GOAL #2   Title Pt will improve TUG manual score to less than or equal to 14 sec for decreased fall risk.    Baseline 16.15 sec    Time 4    Period Weeks    Status New      PT SHORT TERM GOAL #3   Title Pt will improve MiniBESTest score to at least 17/28 for decreased fall risk.    Baseline 14/28 at eval    Time 4    Period Weeks    Status New      PT SHORT TERM GOAL #4   Title Pt will verbalize understanding of fall prevention, tips to reduce freezing/festination with turns and gait.    Time 4    Period Weeks    Status New             PT Long Term Goals - 05/02/20 0715      PT LONG TERM GOAL #1   Title Pt will be independent with progression of HEP to address balance, gait.  TARGET 05/24/2020    Time 8    Period Weeks      Status New      PT LONG  TERM GOAL #2   Title Pt will improve MiniBESTest score to at least 20/28 for decreased fall risk.    Time 8    Period Weeks    Status New      PT LONG TERM GOAL #3   Title Pt will recover posterior LOB in 2 steps or less to improve balance recovery.    Time 8    Period Weeks    Status New      PT LONG TERM GOAL #4   Title Pt will improve gait velocity to at least 2.62 ft/sec for improved gait efficiency in the community.    Time 8    Period Weeks    Status New                 Plan - 05/12/20 0933    Clinical Impression Statement Focus of today's session was reviewing standing PWR moves from previous HEP for pt to perform at the counter and trunk rotation in the corner. Pt needing cues for proper technique and needing to slow down movement at times. Remainder of session focused on balance strategies on compliant surfaces with min guard/min A at times for balance. Will continue to progress towards LTGs.    Personal Factors and Comorbidities Comorbidity 3+    Comorbidities GERD, OA,C-spine fusion, post-laminectory lumbar spine;  L THR 2019, L RTC repair 2020    Examination-Activity Limitations Locomotion Level;Transfers;Stand    Examination-Participation Restrictions Community Activity   Community fitness   Stability/Clinical Decision Making Evolving/Moderate complexity    Rehab Potential Good    PT Frequency 2x / week    PT Duration 8 weeks   including eval week   PT Treatment/Interventions ADLs/Self Care Home Management;Neuromuscular re-education;Balance training;Therapeutic exercise;Therapeutic activities;Functional mobility training;Gait training;Stair training;Patient/family education;DME Instruction;Manual techniques    PT Next Visit Plan how is HEP? retro gait, need to work on balance strategies (stepping, vision removed), compliant surface ex    PT Home Exercise Plan [id number]    Consulted and Agree with Plan of Care Patient            Patient will benefit from skilled therapeutic intervention in order to improve the following deficits and impairments:  Abnormal gait, Difficulty walking, Decreased safety awareness, Decreased balance, Decreased mobility, Decreased strength, Postural dysfunction  Visit Diagnosis: Unsteadiness on feet  Other abnormalities of gait and mobility  Other symptoms and signs involving the nervous system  Abnormal posture     Problem List There are no problems to display for this patient.   Arliss Journey, PT, DPT  05/12/2020, 9:37 AM  [facility] [address] [place], [place], [address] Phone: [phone]   Fax:  [phone]  Name: Jim Carson MRN: [id number] Date of Birth: Apr 20, 1951

## 2020-05-17 ENCOUNTER — Ambulatory Visit: Payer: Medicare Other | Admitting: Occupational Therapy

## 2020-05-17 ENCOUNTER — Other Ambulatory Visit: Payer: Self-pay

## 2020-05-17 ENCOUNTER — Ambulatory Visit: Payer: Medicare Other | Admitting: Physical Therapy

## 2020-05-17 ENCOUNTER — Encounter: Payer: Self-pay | Admitting: Occupational Therapy

## 2020-05-17 DIAGNOSIS — R29818 Other symptoms and signs involving the nervous system: Secondary | ICD-10-CM

## 2020-05-17 DIAGNOSIS — R278 Other lack of coordination: Secondary | ICD-10-CM

## 2020-05-17 DIAGNOSIS — M25621 Stiffness of right elbow, not elsewhere classified: Secondary | ICD-10-CM

## 2020-05-17 DIAGNOSIS — M6281 Muscle weakness (generalized): Secondary | ICD-10-CM

## 2020-05-17 DIAGNOSIS — M25612 Stiffness of left shoulder, not elsewhere classified: Secondary | ICD-10-CM

## 2020-05-17 DIAGNOSIS — R293 Abnormal posture: Secondary | ICD-10-CM

## 2020-05-17 NOTE — Therapy (Signed)
[facility] [address] [place], [place], [address] Phone: [phone]   Fax:  [phone]  Occupational Therapy Treatment  Patient Details  Name: Jim Carson MRN: [id number] Date of Birth: Mar 14, 1951 Referring Provider (OT): Dr. Lavena Bullion   Encounter Date: 05/17/2020   OT End of Session - 05/17/20 1134    Visit Number 4    Number of Visits 11   corrected amount   Date for OT Re-Evaluation 06/12/20    Authorization Type Medicare/ AA/RP---watch for KX- anticipate d/c after 5 weeks    Authorization Time Period week 2/5    Authorization - Visit Number 4    Authorization - Number of Visits 10    Progress Note Due on Visit 10    OT Start Time 1103    OT Stop Time 1145    OT Time Calculation (min) 42 min    Activity Tolerance Patient tolerated treatment well    Behavior During Therapy [facility] for tasks assessed/performed           Past Medical History:  Diagnosis Date  . Abnormal gait    due to parkinson's,  uses cane  . Allergic rhinitis   . Benign essential hypertension   . Chronic constipation   . Deviated septum   . GERD (gastroesophageal reflux disease)   . History of multiple concussions    per pt as teen playing football, no residual  . Hypothyroidism    followed by pcp  . Left shoulder pain   . OA (osteoarthritis)   . OSA on CPAP   . Parkinson disease [facility])    neurologist-- dr b. scott  @[facility]  [facility]  . Vitamin B12 deficiency   . Vitamin D deficiency   . Wears glasses     Past Surgical History:  Procedure Laterality Date  . APPENDECTOMY  07/1964  . LAPAROSCOPIC CHOLECYSTECTOMY  01-21-2017   @[facility] , [place]  . POSTERIOR FUSION CERVICAL SPINE  08-15-2011   @[facility] , [place]   w/ laminectomy and decompression-- C3 -- T1  . POSTERIOR LAMINECTOMY / DECOMPRESSION LUMBAR SPINE  07-30-2013   @[facility] ,    bilateral T11 - 12,  L2 --5  . 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 SHOULDER ARTHROSCOPY WITH ROTATOR CUFF REPAIR Left 12/15/2018   Procedure: SHOULDER ARTHROSCOPY, biceps tenodesis labored Debridement, submacromial decompression;  Surgeon: Sydnee Cabal, MD;  Location: [facility];  Service: Orthopedics;  Laterality: Left;  interscalene block  . 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   Subjective Assessment - 05/17/20 1134    Subjective  Denies pain now    Pertinent History Parkinson's Disease, hypothyroidism, hx of multiple concussions, GERD, benign essential HTN, abnormal gait, L THA 2019, removal of T1 paraspinous mass 2017, T11-12 and L2-5 lumbar lami/decompression 2015, cervical fusion C3-T1 2013, hx of shoulder surgery June 2020    Patient Stated Goals play guitar better    Currently in Pain? No/denies                 Treatment: Reviewed community resources and began education regarding ways to prevent future complications. Pt verbalized understanding. Handwriting strategies, min v.c foam grip provided. Pt was able to write a short paragraph with good legibility and min decrease in letter size.               OT  Education - 05/17/20 1210    Education Details PWR! hands basic 4 min v.c, Updated HEP to include dual tasking or use of bilateral UE's, see pt instructions pt returned demonstration, min v.c for slowing down and larger amplitude movements    Person(s) Educated Patient    Methods Explanation;Demonstration;Verbal cues;Handout    Comprehension Verbalized understanding;Returned demonstration;Verbal cues required            OT Short Term Goals - 04/14/20 1327      OT SHORT TERM GOAL #1   Title ---------------------no short term goals      OT SHORT TERM GOAL #2   Status --      OT SHORT TERM GOAL #3   Status --      OT SHORT TERM GOAL #4   Status --             OT Long Term Goals - 05/17/20 1106       OT LONG TERM GOAL #1   Title I with PD specific HEP    Time 5    Period Weeks    Status New      OT LONG TERM GOAL #2   Title Pt will demonstrate improved fine motor coordination for ADLs as evidenced by decreasing 9 hole peg test score for RUE by 3 secs    Baseline RUE 46.19, LUE 36.97    Time 5    Period Weeks    Status New      OT LONG TERM GOAL #3   Title Pt will verbalize understanding of ways to prevent future PD related complications, and to protect shoulder/ back since hx of surgeries and PD community resources.    Time 5    Period Weeks    Status New      OT LONG TERM GOAL #4   Title Pt will increase RUE grip strength by 5 lbs for increased functional use, and guitar playing.    Baseline RUE 51.1 lbs, LUE 80.9 lbs    Time 5    Period Weeks    Status New      OT LONG TERM GOAL #5   Title Pt will improve bilateral standing functional reach to 10 inches or greater for increased safety and I with ADLs    Baseline RUE 8.5, LUE 9 inches    Time 5    Period Weeks    Status New      OT LONG TERM GOAL #6   Title Pt will improve 3 button unbutton score to 35 secs or less for increased ease with ADLs.    Baseline 39.37 secs    Time 5    Period Weeks    Status New      OT LONG TERM GOAL #7   Title I with adapted strategeis for ADLs/IADLS including handwriting    Time 5    Period Weeks    Status New                 Plan - 05/17/20 1216    Clinical Impression Statement Pt demonstrates understanding of updates to HEP for dual tasking / use of bilateral UE's.    Occupational performance deficits (Please refer to evaluation for details): ADL's;IADL's;Work;Play;Leisure;Social Participation    Body Structure / Function / Physical Skills ADL;Balance;Mobility;Strength;Flexibility;UE functional use;FMC;Coordination;Decreased knowledge of precautions;GMC;ROM;Sensation;IADL;Dexterity    Cognitive Skills Memory    Rehab Potential Good    Clinical Decision Making Limited  treatment options, no task modification necessary  OT Frequency 2x / week    OT Duration --   5 weeks plus eval- frequency corrected   OT Treatment/Interventions Self-care/ADL training;Therapeutic exercise;Aquatic Therapy;Ultrasound;Neuromuscular education;Manual Therapy;Therapeutic activities;Paraffin;DME and/or AE instruction;Cognitive remediation/compensation;Moist Heat;Passive range of motion;Patient/family education;Fluidtherapy    Plan reinforce PWR! up and rock in modified quadraped, continue to work towards goals    Consulted and Agree with Plan of Care Patient           Patient will benefit from skilled therapeutic intervention in order to improve the following deficits and impairments:   Body Structure / Function / Physical Skills: ADL, Balance, Mobility, Strength, Flexibility, UE functional use, FMC, Coordination, Decreased knowledge of precautions, GMC, ROM, Sensation, IADL, Dexterity Cognitive Skills: Memory     Visit Diagnosis: Other symptoms and signs involving the nervous system  Abnormal posture  Muscle weakness (generalized)  Other lack of coordination  Stiffness of right elbow, not elsewhere classified  Stiffness of left shoulder, not elsewhere classified    Problem List There are no problems to display for this patient.   Karyna Bessler 05/17/2020, 12:20 PM Theone Murdoch, OTR/L [phone] Phone: [phone] 12:20 PM 05/17/20 [facility] [address] [place], [place], [address] Phone: [phone]   Fax:  [phone]  Name: Jamarri Vuncannon MRN: [id number] Date of Birth: 01/07/1951

## 2020-05-17 NOTE — Therapy (Signed)
[facility] [address] [place], [place], [address] Phone: [phone]   Fax:  [phone]  Physical Therapy Treatment  Patient Details  Name: Jim Carson MRN: [id number] Date of Birth: 03-26-1951 Referring Provider (PT): Lavena Bullion, MD   Encounter Date: 05/17/2020   PT End of Session - 05/17/20 1200    Visit Number 4    Number of Visits 16    Date for PT Re-Evaluation 42/59/56   90 day cert for 8-week POC   Authorization Type Medicare/AARP-will need 10th visit progress note; KX initiated at eval (as pt was seen 13 visits earlier this year for PT)    PT Start Time 77   therapist running late   PT Stop Time 1101    PT Time Calculation (min) 39 min    Equipment Utilized During Treatment Gait belt    Activity Tolerance Patient tolerated treatment well    Behavior During Therapy [facility] for tasks assessed/performed           Past Medical History:  Diagnosis Date  . Abnormal gait    due to parkinson's,  uses cane  . Allergic rhinitis   . Benign essential hypertension   . Chronic constipation   . Deviated septum   . GERD (gastroesophageal reflux disease)   . History of multiple concussions    per pt as teen playing football, no residual  . Hypothyroidism    followed by pcp  . Left shoulder pain   . OA (osteoarthritis)   . OSA on CPAP   . Parkinson disease [facility])    neurologist-- dr b. scott  @[facility]  . Vitamin B12 deficiency   . Vitamin D deficiency   . Wears glasses     Past Surgical History:  Procedure Laterality Date  . APPENDECTOMY  07/1964  . LAPAROSCOPIC CHOLECYSTECTOMY  01-21-2017   @[facility] , [place]  . POSTERIOR FUSION CERVICAL SPINE  08-15-2011   @[facility] , [place]   w/ laminectomy and decompression-- C3 -- T1  . POSTERIOR LAMINECTOMY / DECOMPRESSION LUMBAR SPINE  07-30-2013   @[facility] , [place]   bilateral T11 - 12,  L2 --5  . 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 SHOULDER ARTHROSCOPY WITH ROTATOR CUFF REPAIR Left 12/15/2018   Procedure: SHOULDER ARTHROSCOPY, biceps tenodesis labored Debridement, submacromial decompression;  Surgeon: Sydnee Cabal, MD;  Location: [facility];  Service: Orthopedics;  Laterality: Left;  interscalene block  . 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   Subjective Assessment - 05/17/20 1024    Subjective No falls. Has been doing his standing PWR moves - has been working on trying to slow it down.    Patient Stated Goals Pt's goal for therapy is to try to get back balance to where I was.  Want to have more confidence with walking.    Currently in Pain? No/denies                                  Balance Exercises - 05/17/20 0001      Balance Exercises: Standing   Standing Eyes Closed Wide (BOA);Foam/compliant surface;4 reps;20 secs;Limitations    Standing Eyes Closed Limitations on blue air ex, beginning with wider BOS 2 x 30 seconds and then  feet hip width distance 4 x  20 seconds (intermittent taps to chair for balance)   Wall Bumps Hip;Eyes opened;Limitations    Wall Bumps Limitations x10 reps on blue air ex, verbal and demo cues for proper technique    Stepping Strategy Lateral;Posterior;Anterior;UE support;Foam/compliant surface;10 reps;Limitations    Stepping Strategy Limitations beginning on solid ground x10 reps alternating each- cues for incr foot clearance and weight shift, then performing each on blue air ex x10 reps alternating each leg   Rockerboard Anterior/posterior;Limitations    Rockerboard Limitations holding board steady - alternating UE lifts x10 reps B intermittent taps to bar for balance due to pt losing balance posteriorly    Retro Gait Limitations    Retro Gait Limitations 3 reps on level ground next to countertop with intermittent UE support - cues for wider BOS and slowing  movement down as pt tends to perform too hurriedly, then performed 3 reps down and back on red mat - with min guard/min A for balance    Other Standing Exercises standing on blue air ex - reaching forwards outside of BOS forward to tap cone x6 reps B, cues for a "soft" knee as pt with tendency to hyperextend R leg               PT Short Term Goals - 05/02/20 0713      PT SHORT TERM GOAL #1   Title Pt will be independent with HEP to address balance, transfers, and gait.  TARGET 05/26/2020    Time 4    Period Weeks      PT SHORT TERM GOAL #2   Title Pt will improve TUG manual score to less than or equal to 14 sec for decreased fall risk.    Baseline 16.15 sec    Time 4    Period Weeks    Status New      PT SHORT TERM GOAL #3   Title Pt will improve MiniBESTest score to at least 17/28 for decreased fall risk.    Baseline 14/28 at eval    Time 4    Period Weeks    Status New      PT SHORT TERM GOAL #4   Title Pt will verbalize understanding of fall prevention, tips to reduce freezing/festination with turns and gait.    Time 4    Period Weeks    Status New             PT Long Term Goals - 05/02/20 0715      PT LONG TERM GOAL #1   Title Pt will be independent with progression of HEP to address balance, gait.  TARGET 05/24/2020    Time 8    Period Weeks    Status New      PT LONG TERM GOAL #2   Title Pt will improve MiniBESTest score to at least 20/28 for decreased fall risk.    Time 8    Period Weeks    Status New      PT LONG TERM GOAL #3   Title Pt will recover posterior LOB in 2 steps or less to improve balance recovery.    Time 8    Period Weeks    Status New      PT LONG TERM GOAL #4   Title Pt will improve gait velocity to at least 2.62 ft/sec for improved gait efficiency in the community.    Time 8    Period Weeks    Status New  Plan - 05/17/20 1208    Clinical Impression Statement Today's skilled session continued to focus  on balance strategies on compliant surfaces and retro gait. Pt needing cues at times during retro gait next to countertop and gait around session to slow down movement as pt with tendency to perform too hurriedly. Pt with improvements in balance with vision removed today - able to progress from a wider BOS to feet hip width distance with pt holding for 20 seconds . Will continue to progress towards LTGs.    Personal Factors and Comorbidities Comorbidity 3+    Comorbidities GERD, OA,C-spine fusion, post-laminectory lumbar spine;  L THR 2019, L RTC repair 2020    Examination-Activity Limitations Locomotion Level;Transfers;Stand    Examination-Participation Restrictions Community Activity   Community fitness   Stability/Clinical Decision Making Evolving/Moderate complexity    Rehab Potential Good    PT Frequency 2x / week    PT Duration 8 weeks   including eval week   PT Treatment/Interventions ADLs/Self Care Home Management;Neuromuscular re-education;Balance training;Therapeutic exercise;Therapeutic activities;Functional mobility training;Gait training;Stair training;Patient/family education;DME Instruction;Manual techniques    PT Next Visit Plan retro gait, turning practice, need to work on balance strategies (stepping, vision removed), compliant surface ex (rockerboard), activities for incr foot clearance.    PT Home Exercise Plan [id number]    Consulted and Agree with Plan of Care Patient           Patient will benefit from skilled therapeutic intervention in order to improve the following deficits and impairments:  Abnormal gait, Difficulty walking, Decreased safety awareness, Decreased balance, Decreased mobility, Decreased strength, Postural dysfunction  Visit Diagnosis: Other symptoms and signs involving the nervous system  Abnormal posture  Muscle weakness (generalized)  Other lack of coordination     Problem List There are no problems to display for this patient.   Arliss Journey, PT, DPT  05/17/2020, 12:10 PM  [facility] [address] [place], [place], [address] Phone: [phone]   Fax:  [phone]  Name: Jim Carson MRN: [id number] Date of Birth: 02/28/1951

## 2020-05-17 NOTE — Patient Instructions (Signed)
Perform the following exercises for 20 minutes 1 times per day. Perform with both hand(s). Perform using big movements.  If able add some of the options to "make you think" Perform while sitting down so that you do not lose your balance             Flipping Cards: Place deck of cards on the table. Flip cards over by opening your hand big to grasp and then turn your palm up big, opening hand fully to release.              To Make you think:  Flip red cards over with right hand and black  Cards over with L hand.  Or Sort cards by number as you flip over.   Rotate ball with fingertips: Pick up with fingers/thumb and move as much as you can with each turn/movement (clockwise and counter-clockwise).     To make it harder:  Perform with one ball in each hand at the same time.  To make you think:  Try to say the months of the year backwards while performing with one hand at a time.   Toss ball from one hand to the other: Toss big/high. Deliberately open with toss and deliberately close hand after catching  To make you think:  Perform while counting backwards by 1's, 2's, or 3's   Toss ball in the air and catch with the same hand: Toss big/high. Deliberately open with toss and deliberately close hand after catch.  To make you think:  Say a food, animal, city, etc. With each letter of the alphabet.    Pick up coins and stack one at a time: Pick up with big, intentional movements. Do not drag coin to the edge.   To make you think:  Make a stack with 5 coins with both hands at the same time.     Pick up 5-10 coins one at a time and hold in palm. Then, move coins from palm to fingertips one at time and place in coin bank/container.   To it harder: try to do both hands at the same time   PWR! Hands:Start with elbows bent and hands closed, Then, Push hands out BIG. Elbows straight, wrists up, fingers open and spread apart BIG.

## 2020-05-19 ENCOUNTER — Ambulatory Visit: Payer: Medicare Other | Admitting: Occupational Therapy

## 2020-05-19 ENCOUNTER — Other Ambulatory Visit: Payer: Self-pay

## 2020-05-19 ENCOUNTER — Encounter: Payer: Self-pay | Admitting: Occupational Therapy

## 2020-05-19 ENCOUNTER — Ambulatory Visit: Payer: Medicare Other | Admitting: Physical Therapy

## 2020-05-19 DIAGNOSIS — M6281 Muscle weakness (generalized): Secondary | ICD-10-CM

## 2020-05-19 DIAGNOSIS — R2689 Other abnormalities of gait and mobility: Secondary | ICD-10-CM

## 2020-05-19 DIAGNOSIS — R293 Abnormal posture: Secondary | ICD-10-CM

## 2020-05-19 DIAGNOSIS — M25621 Stiffness of right elbow, not elsewhere classified: Secondary | ICD-10-CM

## 2020-05-19 DIAGNOSIS — R278 Other lack of coordination: Secondary | ICD-10-CM

## 2020-05-19 DIAGNOSIS — R2681 Unsteadiness on feet: Secondary | ICD-10-CM

## 2020-05-19 DIAGNOSIS — M25612 Stiffness of left shoulder, not elsewhere classified: Secondary | ICD-10-CM

## 2020-05-19 DIAGNOSIS — R29818 Other symptoms and signs involving the nervous system: Secondary | ICD-10-CM

## 2020-05-19 NOTE — Therapy (Signed)
[facility] [address] [place], [place], [address] Phone: [phone]   Fax:  [phone]  Occupational Therapy Treatment  Patient Details  Name: Jim Carson MRN: [id number] Date of Birth: 02/13/1951 Referring Provider (OT): Dr. Lavena Bullion   Encounter Date: 05/19/2020   OT End of Session - 05/19/20 1004    Visit Number 5    Number of Visits 11    Date for OT Re-Evaluation 06/12/20    Authorization Type Medicare/ AA/RP---watch for KX- anticipate d/c after 5 weeks    Authorization Time Period week 2/5    Authorization - Visit Number 5    Authorization - Number of Visits 10    OT Start Time 0934    OT Stop Time 1014    OT Time Calculation (min) 40 min           Past Medical History:  Diagnosis Date  . Abnormal gait    due to parkinson's,  uses cane  . Allergic rhinitis   . Benign essential hypertension   . Chronic constipation   . Deviated septum   . GERD (gastroesophageal reflux disease)   . History of multiple concussions    per pt as teen playing football, no residual  . Hypothyroidism    followed by pcp  . Left shoulder pain   . OA (osteoarthritis)   . OSA on CPAP   . Parkinson disease [facility])    neurologist-- dr b. scott  @[facility]  . Vitamin B12 deficiency   . Vitamin D deficiency   . Wears glasses     Past Surgical History:  Procedure Laterality Date  . APPENDECTOMY  07/1964  . LAPAROSCOPIC CHOLECYSTECTOMY  01-21-2017   @[facility] , [place]  . POSTERIOR FUSION CERVICAL SPINE  08-15-2011   @[facility] , [place]   w/ laminectomy and decompression-- C3 -- T1  . POSTERIOR LAMINECTOMY / DECOMPRESSION LUMBAR SPINE  07-30-2013   @[facility] , [place]   bilateral T11 - 12,  L2 --5  . 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 SHOULDER ARTHROSCOPY WITH ROTATOR CUFF REPAIR Left 12/15/2018   Procedure: SHOULDER ARTHROSCOPY, biceps tenodesis labored Debridement,  submacromial decompression;  Surgeon: Sydnee Cabal, MD;  Location: [facility];  Service: Orthopedics;  Laterality: Left;  interscalene block  . 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   Subjective Assessment - 05/19/20 0935    Subjective  Denies pain now    Pertinent History Parkinson's Disease, hypothyroidism, hx of multiple concussions, GERD, benign essential HTN, abnormal gait, L THA 2019, removal of T1 paraspinous mass 2017, T11-12 and L2-5 lumbar lami/decompression 2015, cervical fusion C3-T1 2013, hx of shoulder surgery June 2020    Patient Stated Goals play guitar better    Currently in Pain? No/denies                  Treatment: PWR! moves basic 4 seated, min v.c for larger amplitude movements, and positioning to protect back due to hx of back surgery, dynamic step and reach to right and left sides to copy small peg design for increased fine motor coordination, mod drops, min v.c for larger amplitude movements, increased time and min-mod v.c Multi directional stepping to follow written instructions, minguard, for cognitive component with balance for ADLS.  OT Short Term Goals - 04/14/20 1327      OT SHORT TERM GOAL #1   Title ---------------------no short term goals      OT SHORT TERM GOAL #2   Status --      OT SHORT TERM GOAL #3   Status --      OT SHORT TERM GOAL #4   Status --             OT Long Term Goals - 05/19/20 0947      OT LONG TERM GOAL #1   Title I with PD specific HEP    Time 5    Period Weeks    Status Achieved      OT LONG TERM GOAL #2   Title Pt will demonstrate improved fine motor coordination for ADLs as evidenced by decreasing 9 hole peg test score for RUE by 3 secs    Baseline RUE 46.19, LUE 36.97    Time 5    Period Weeks    Status New      OT LONG TERM GOAL #3   Title Pt will verbalize understanding of ways to prevent  future PD related complications, and to protect shoulder/ back since hx of surgeries and PD community resources.    Time 5    Period Weeks    Status Achieved      OT LONG TERM GOAL #4   Title Pt will increase RUE grip strength by 5 lbs for increased functional use, and guitar playing.    Baseline RUE 51.1 lbs, LUE 80.9 lbs    Time 5    Period Weeks    Status On-going      OT LONG TERM GOAL #5   Title Pt will improve bilateral standing functional reach to 10 inches or greater for increased safety and I with ADLs    Baseline RUE 8.5, LUE 9 inches    Time 5    Period Weeks    Status On-going      OT LONG TERM GOAL #6   Title Pt will improve 3 button unbutton score to 35 secs or less for increased ease with ADLs.    Baseline 39.37 secs    Time 5    Period Weeks    Status New      OT LONG TERM GOAL #7   Title I with adapted strategeis for ADLs/IADLS including handwriting    Time 5    Period Weeks    Status New                  Patient will benefit from skilled therapeutic intervention in order to improve the following deficits and impairments:           Visit Diagnosis: Other symptoms and signs involving the nervous system  Abnormal posture  Muscle weakness (generalized)  Stiffness of right elbow, not elsewhere classified  Other lack of coordination  Stiffness of left shoulder, not elsewhere classified    Problem List There are no problems to display for this patient.   Tamala Manzer 05/19/2020, 10:05 AM  [facility] [address] [place], [place], [address] Phone: [phone]   Fax:  [phone]  Name: Yates Weisgerber MRN: [id number] Date of Birth: 1951-02-25

## 2020-05-19 NOTE — Therapy (Signed)
[facility] [address] [place], [place], [address] Phone: [phone]   Fax:  [phone]  Physical Therapy Treatment  Patient Details  Name: Jim Carson MRN: [id number] Date of Birth: [birth year] Referring Provider (PT): Lavena Bullion, MD   Encounter Date: 05/19/2020   PT End of Session - 05/19/20 0851    Visit Number 5    Number of Visits 16    Date for PT Re-Evaluation 35/46/56   90 day cert for 8-week POC   Authorization Type Medicare/AARP-will need 10th visit progress note; KX initiated at eval (as pt was seen 13 visits earlier this year for PT)    PT Start Time 0848    PT Stop Time 0930    PT Time Calculation (min) 42 min    Equipment Utilized During Treatment Gait belt    Activity Tolerance Patient tolerated treatment well    Behavior During Therapy WFL for tasks assessed/performed           Past Medical History:  Diagnosis Date   Abnormal gait    due to parkinson's,  uses cane   Allergic rhinitis    Benign essential hypertension    Chronic constipation    Deviated septum    GERD (gastroesophageal reflux disease)    History of multiple concussions    per pt as teen playing football, no residual   Hypothyroidism    followed by pcp   Left shoulder pain    OA (osteoarthritis)    OSA on CPAP    Parkinson disease [facility])    neurologist-- dr b. 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   Subjective Assessment - 05/19/20 0851    Subjective No changes since last visit.  No falls.    Patient Stated Goals Pt's goal for therapy is to try to get back balance to where I was.  Want to have more confidence with walking.    Currently in Pain? No/denies                             [facility] Adult PT Treatment/Exercise - 05/19/20 0001      Neuro Re-ed    Neuro Re-ed Details  At end of session, gait x 100 ft, with cues for stop/start, forward/back directions using cane with min guard.  Cues for widened/staggered foot position with quick stop.  Pt with near LOB when initiating retro gait with cane.  Balance Exercises - 05/19/20 0001      Balance Exercises: Standing   Standing Eyes Opened Wide (BOA);Foam/compliant surface    Standing Eyes Opened Limitations with feet slightly apart, 2 x 10 reps head turns, 2 x 10 reps head nods    Standing Eyes Closed Wide (BOA);Foam/compliant surface;4 reps;20 secs;Limitations    Standing Eyes Closed Limitations on blue air ex, feet hip distance apart; pt with posterior lean at times, using the wall/UE support to regain balance    Wall Bumps Hip;Eyes opened;Limitations    Wall Bumps Limitations x10 reps on blue air ex; 2nd set x 10 with Eyes closed    Stepping Strategy Lateral;Posterior;Anterior;UE support;Foam/compliant surface;10 reps;Limitations    Stepping Strategy Limitations Cues for upright posture, widened BOS in midline    Retro Gait Limitations    Retro Gait Limitations 5 reps on level ground, forward/back, cues  for step length and posture    Heel Raises Both;10 reps    Toe Raise Both;10 reps    Other Standing Exercises Standing on Airex:  forward<>back step and weightshift x 10 reps; then perfromed on solid surface x 10 reps; then side step onto, then off Airex x 10 reps each side, with cues for foot clearance.    Other Standing Exercises Comments Resisted sidestepping with green theraband, BUE counter support, R and L along counter x 2 reps.  Monster walk (diagonal forward and back), resisted with green theraband, 1 UE support x 3 reps.  Cues for eccentric control               PT Short Term Goals - 05/02/20 0713      PT SHORT TERM GOAL #1   Title Pt will be independent with HEP to address balance, transfers, and gait.  TARGET 05/26/2020    Time 4    Period Weeks      PT SHORT TERM GOAL #2   Title Pt will improve TUG manual score to less than or equal to 14 sec for decreased fall risk.    Baseline 16.15 sec    Time 4    Period Weeks    Status New      PT SHORT TERM GOAL #3   Title Pt will improve MiniBESTest score to at least 17/28 for decreased fall risk.    Baseline 14/28 at eval    Time 4    Period Weeks    Status New      PT SHORT TERM GOAL #4   Title Pt will verbalize understanding of fall prevention, tips to reduce freezing/festination with turns and gait.    Time 4    Period Weeks    Status New             PT Long Term Goals - 05/02/20 0715      PT LONG TERM GOAL #1   Title Pt will be independent with progression of HEP to address balance, gait.  TARGET 05/24/2020    Time 8    Period Weeks    Status New      PT LONG TERM GOAL #2   Title Pt will improve MiniBESTest score to at least 20/28 for decreased fall risk.    Time 8    Period Weeks    Status New      PT LONG TERM GOAL #3   Title Pt will recover posterior LOB in 2 steps or less to improve balance recovery.    Time 8    Period Weeks  Status New      PT LONG TERM GOAL #4   Title Pt will improve  gait velocity to at least 2.62 ft/sec for improved gait efficiency in the community.    Time 8    Period Weeks    Status New                 Plan - 05/19/20 1032    Clinical Impression Statement Continued to work on compliant surfaces and balance in retro direction today.  With gait stops/starts/changes of direction at end of session, he has near LOB with transition to backwards walking.  He appears to have improved awareness for foot clearance and slower, controlled movements today.    Personal Factors and Comorbidities Comorbidity 3+    Comorbidities GERD, OA,C-spine fusion, post-laminectory lumbar spine;  L THR 2019, L RTC repair 2020    Examination-Activity Limitations Locomotion Level;Transfers;Stand    Examination-Participation Restrictions Community Activity   Community fitness   Stability/Clinical Decision Making Evolving/Moderate complexity    Rehab Potential Good    PT Frequency 2x / week    PT Duration 8 weeks   including eval week   PT Treatment/Interventions ADLs/Self Care Home Management;Neuromuscular re-education;Balance training;Therapeutic exercise;Therapeutic activities;Functional mobility training;Gait training;Stair training;Patient/family education;DME Instruction;Manual techniques    PT Next Visit Plan Check STGs next week (pt is only scheduled 1x/wk for 2 more weeks after next week; may need to discuss adding more appts for completing POC).  Continue retro gait (as well as transitions forward<>back), turning practice, need to work on balance strategies (stepping, vision removed), compliant surface ex (rockerboard), activities for incr foot clearance.    PT Home Exercise Plan [id number]    Consulted and Agree with Plan of Care Patient           Patient will benefit from skilled therapeutic intervention in order to improve the following deficits and impairments:  Abnormal gait, Difficulty walking, Decreased safety awareness, Decreased balance, Decreased mobility,  Decreased strength, Postural dysfunction  Visit Diagnosis: Unsteadiness on feet  Other abnormalities of gait and mobility     Problem List There are no problems to display for this patient.   Bernd Crom W. 05/19/2020, 10:37 AM  Frazier Butt., PT   [facility] [address] [place], [place], [address] Phone: [phone]   Fax:  [phone]  Name: Taylon Coole MRN: [id number] Date of Birth: [birth year]

## 2020-05-22 ENCOUNTER — Ambulatory Visit: Payer: Medicare Other | Admitting: Occupational Therapy

## 2020-05-22 ENCOUNTER — Other Ambulatory Visit: Payer: Self-pay

## 2020-05-22 ENCOUNTER — Ambulatory Visit: Payer: Medicare Other | Admitting: Physical Therapy

## 2020-05-22 DIAGNOSIS — R2689 Other abnormalities of gait and mobility: Secondary | ICD-10-CM

## 2020-05-22 DIAGNOSIS — R293 Abnormal posture: Secondary | ICD-10-CM

## 2020-05-22 DIAGNOSIS — R278 Other lack of coordination: Secondary | ICD-10-CM

## 2020-05-22 DIAGNOSIS — M6281 Muscle weakness (generalized): Secondary | ICD-10-CM

## 2020-05-22 DIAGNOSIS — M25612 Stiffness of left shoulder, not elsewhere classified: Secondary | ICD-10-CM

## 2020-05-22 DIAGNOSIS — R29818 Other symptoms and signs involving the nervous system: Secondary | ICD-10-CM

## 2020-05-22 NOTE — Therapy (Signed)
[facility] [address] [place], [place], [address] Phone: [phone]   Fax:  [phone]  Physical Therapy Treatment  Patient Details  Name: Jim Carson MRN: [id number] Date of Birth: Dec 03, 1950 Referring Provider (PT): Lavena Bullion, MD   Encounter Date: 05/22/2020   PT End of Session - 05/22/20 1205    Visit Number 6    Number of Visits 16    Date for PT Re-Evaluation 00/93/81   90 day cert for 8-week POC   Authorization Type Medicare/[employer]ARP-will need 10th visit progress note; KX initiated at eval (as pt was seen 13 visits earlier this year for PT)    PT Start Time 1104    PT Stop Time 1144    PT Time Calculation (min) 40 min    Equipment Utilized During Treatment Gait belt    Activity Tolerance Patient tolerated treatment well    Behavior During Therapy [facility] for tasks assessed/performed           Past Medical History:  Diagnosis Date  . Abnormal gait    due to parkinson's,  uses cane  . Allergic rhinitis   . Benign essential hypertension   . Chronic constipation   . Deviated septum   . GERD (gastroesophageal reflux disease)   . History of multiple concussions    per pt as teen playing football, no residual  . Hypothyroidism    followed by pcp  . Left shoulder pain   . OA (osteoarthritis)   . OSA on CPAP   . Parkinson disease [facility])    neurologist-- dr b. scott  _0  Neurology  . Vitamin B12 deficiency   . Vitamin D deficiency   . Wears glasses     Past Surgical History:  Procedure Laterality Date  . APPENDECTOMY  07/1964  . LAPAROSCOPIC CHOLECYSTECTOMY  01-21-2017   _1 , [place]  . POSTERIOR FUSION CERVICAL SPINE  08-15-2011   _2 , [place]   w/ laminectomy and decompression-- C3 -- T1  . POSTERIOR LAMINECTOMY / DECOMPRESSION LUMBAR SPINE  07-30-2013   _3 , [place]   bilateral T11 - 12,  L2 --5  . 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  SHOULDER ARTHROSCOPY WITH ROTATOR CUFF REPAIR Left 12/15/2018   Procedure: SHOULDER ARTHROSCOPY, biceps tenodesis labored Debridement, submacromial decompression;  Surgeon: Sydnee Cabal, MD;  Location: [facility];  Service: Orthopedics;  Laterality: Left;  interscalene block  . TOTAL HIP ARTHROPLASTY Left 09/18/2017   _5 , [place]    There were no vitals filed for this visit.   Subjective Assessment - 05/22/20 1106    Subjective No changes, no falls.    Patient Stated Goals Pt's goal for therapy is to try to get back balance to where I was.  Want to have more confidence with walking.    Currently in Pain? No/denies              [facility] PT Assessment - 05/22/20 1114      Mini-BESTest   Sit To Stand Normal: Comes to stand without use of hands and stabilizes independently.    Rise to Toes < 3 s.    Stand on one leg (left) Normal: 20 s.    Stand on one leg (right) Moderate: < 20 s   4.68, 1.75   Stand on one leg - lowest score 1    Compensatory Stepping Correction - Forward Moderate: More than one step is required to  recover equilibrium    Compensatory Stepping Correction - Backward Moderate: More than one step is required to recover equilibrium    Compensatory Stepping Correction - Left Lateral Moderate: Several steps to recover equilibrium    Compensatory Stepping Correction - Right Lateral Moderate: Several steps to recover equilibrium    Stepping Corredtion Lateral - lowest score 1    Stance - Feet together, eyes open, firm surface  Normal: 30s    Stance - Feet together, eyes closed, foam surface  Moderate: < 30s   4.62 seconds   Incline - Eyes Closed Normal: Stands independently 30s and aligns with gravity    Change in Gait Speed Normal: Significantly changes walkling speed without imbalance    Walk with head turns - Horizontal Moderate: performs head turns with reduction in gait speed.    Walk with pivot turns Moderate:Turns with feet close SLOW (>4 steps) with  good balance.    Step over obstacles Moderate: Steps over box but touches box OR displays cautious behavior by slowing gait.    Timed UP & GO with Dual Task Normal: No noticeable change in sitting, standing or walking while backward counting when compared to TUG without    Mini-BEST total score 18      Timed Up and Go Test   Manual TUG (seconds) 14.6    TUG Comments with SPC                         [facility] Adult PT Treatment/Exercise - 05/22/20 1114      Ambulation/Gait   Ambulation/Gait Yes    Ambulation/Gait Assistance 5: Supervision;4: Min guard    Ambulation/Gait Assistance Details gait down hallway - approx. 4 bouts of 30' - quick start/stops, forward gait and backwards gait with cane, min guard at times for balance.    Assistive device Straight cane    Gait Pattern Step-to pattern;Step-through pattern;Decreased arm swing - left;Decreased step length - right;Decreased step length - left;Shuffle;Trunk flexed;Narrow base of support;Scissoring;Festinating   narrow BOS   Ambulation Surface Level;Indoor      Therapeutic Activites    Therapeutic Activities Other Therapeutic Activities    Other Therapeutic Activities reviewed with pt fall prevention strategies in the home - using nightlights and removing throw rugs. also discussed with pt strategies to decr freezing episodes with pt able to verbalize understanding of what he should do and to slow down, however sometimes does not perform       Exercises   Exercises Other Exercises    Other Exercises  reviewed certain exercises from HEP: standing hip extension at counter - with verbal and demo cues for technique x10 reps B, posterior stepping strategy x10 reps B - initial cues for hip hingeing                  PT Education - 05/22/20 1201    Education Details progress towards goals, getting scheduled for additional appts    Person(s) Educated Patient    Methods Explanation    Comprehension Verbalized understanding              PT Short Term Goals - 05/22/20 1110      PT SHORT TERM GOAL #1   Title Pt will be independent with HEP to address balance, transfers, and gait.  TARGET 05/26/2020    Baseline pt reports independent with HEP    Time 4    Period Weeks    Status Achieved  PT SHORT TERM GOAL #2   Title Pt will improve TUG manual score to less than or equal to 14 sec for decreased fall risk.    Baseline 16.15 sec, 14.6 seconds with SPC on 05/22/20    Time 4    Period Weeks    Status Not Met      PT SHORT TERM GOAL #3   Title Pt will improve MiniBESTest score to at least 17/28 for decreased fall risk.    Baseline 14/28 at eval, 18/28 on 05/22/20    Time 4    Period Weeks    Status Achieved      PT SHORT TERM GOAL #4   Title Pt will verbalize understanding of fall prevention, tips to reduce freezing/festination with turns and gait.    Time 4    Period Weeks    Status Achieved             PT Long Term Goals - 05/02/20 0715      PT LONG TERM GOAL #1   Title Pt will be independent with progression of HEP to address balance, gait.  TARGET 05/24/2020    Time 8    Period Weeks    Status New      PT LONG TERM GOAL #2   Title Pt will improve MiniBESTest score to at least 20/28 for decreased fall risk.    Time 8    Period Weeks    Status New      PT LONG TERM GOAL #3   Title Pt will recover posterior LOB in 2 steps or less to improve balance recovery.    Time 8    Period Weeks    Status New      PT LONG TERM GOAL #4   Title Pt will improve gait velocity to at least 2.62 ft/sec for improved gait efficiency in the community.    Time 8    Period Weeks    Status New                 Plan - 05/22/20 1413    Clinical Impression Statement Focus of today's skilled session was assessing pt's STGs. Pt achieved 3 out of 4 STGs. Pt is performing HEP consistently - although did need initial cues for proper technique for standing hip extension at countertop. Pt improved  miniBEST test score today to an 18/28 (previously 14/28), with improvements in gait with head turns and stepping strategies in anterior/posterior/lateral directions. Pt did not meet STG #2 in regards to manual TUG - improved score to 14.6 seconds (improved from 16.15, but not quite yet to goal level), still putting pt at an incr risk for falls. Will continue to progress towards LTGs.    Personal Factors and Comorbidities Comorbidity 3+    Comorbidities GERD, OA,C-spine fusion, post-laminectory lumbar spine;  L THR 2019, L RTC repair 2020    Examination-Activity Limitations Locomotion Level;Transfers;Stand    Examination-Participation Restrictions Community Activity   Community fitness   Stability/Clinical Decision Making Evolving/Moderate complexity    Rehab Potential Good    PT Frequency 2x / week    PT Duration 8 weeks   including eval week   PT Treatment/Interventions ADLs/Self Care Home Management;Neuromuscular re-education;Balance training;Therapeutic exercise;Therapeutic activities;Functional mobility training;Gait training;Stair training;Patient/family education;DME Instruction;Manual techniques    PT Next Visit Plan did pt add more appts? Continue retro gait (as well as transitions forward<>back), turning practice, need to work on balance strategies (stepping, vision removed), compliant surface ex (rockerboard), activities  for incr foot clearance.    PT Home Exercise Plan [id number]    Consulted and Agree with Plan of Care Patient           Patient will benefit from skilled therapeutic intervention in order to improve the following deficits and impairments:  Abnormal gait, Difficulty walking, Decreased safety awareness, Decreased balance, Decreased mobility, Decreased strength, Postural dysfunction  Visit Diagnosis: Other abnormalities of gait and mobility  Other symptoms and signs involving the nervous system  Abnormal posture  Muscle weakness (generalized)     Problem  List There are no problems to display for this patient.   Arliss Journey, PT, DPT  05/22/2020, 2:13 PM  [facility] [address] [place], [place], [address] Phone: [phone]   Fax:  [phone]  Name: Jim Carson MRN: [id number] Date of Birth: 19-Aug-1950

## 2020-05-22 NOTE — Therapy (Signed)
[facility] [address] [place], [place], [address] Phone: [phone]   Fax:  [phone]  Occupational Therapy Treatment  Patient Details  Name: Jim Carson MRN: [id number] Date of Birth: May 28, 1951 Referring Provider (OT): Dr. Lavena Bullion   Encounter Date: 05/22/2020   OT End of Session - 05/22/20 1615    Visit Number 6    Number of Visits 11    Authorization Type Medicare/ AA/RP---watch for KX- anticipate d/c after 5 weeks    Authorization - Visit Number 6    Authorization - Number of Visits 10    OT Start Time 1020    OT Stop Time 1100    OT Time Calculation (min) 40 min    Activity Tolerance Patient tolerated treatment well    Behavior During Therapy [facility] for tasks assessed/performed           Past Medical History:  Diagnosis Date  . Abnormal gait    due to parkinson's,  uses cane  . Allergic rhinitis   . Benign essential hypertension   . Chronic constipation   . Deviated septum   . GERD (gastroesophageal reflux disease)   . History of multiple concussions    per pt as teen playing football, no residual  . Hypothyroidism    followed by pcp  . Left shoulder pain   . OA (osteoarthritis)   . OSA on CPAP   . Parkinson disease [facility])    neurologist-- dr b. scott  _0  Neurology  . Vitamin B12 deficiency   . Vitamin D deficiency   . Wears glasses     Past Surgical History:  Procedure Laterality Date  . APPENDECTOMY  07/1964  . LAPAROSCOPIC CHOLECYSTECTOMY  01-21-2017   _1 , [place]  . POSTERIOR FUSION CERVICAL SPINE  08-15-2011   _2 , [place]   w/ laminectomy and decompression-- C3 -- T1  . POSTERIOR LAMINECTOMY / DECOMPRESSION LUMBAR SPINE  07-30-2013   _3 , [place]   bilateral T11 - 12,  L2 --5  . 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 SHOULDER ARTHROSCOPY WITH ROTATOR CUFF REPAIR Left 12/15/2018   Procedure: SHOULDER ARTHROSCOPY,  biceps tenodesis labored Debridement, submacromial decompression;  Surgeon: Sydnee Cabal, MD;  Location[facility];  Service: Orthopedics;  Laterality: Left;  interscalene block  . TOTAL HIP ARTHROPLASTY Left 09/18/2017   _5 , [place]    There were no vitals filed for this visit.   Subjective Assessment - 05/22/20 1615    Subjective  Pt agrees with plans for d/c    Pertinent History Parkinson's Disease, hypothyroidism, hx of multiple concussions, GERD, benign essential HTN, abnormal gait, L THA 2019, removal of T1 paraspinous mass 2017, T11-12 and L2-5 lumbar lami/decompression 2015, cervical fusion C3-T1 2013, hx of shoulder surgery June 2020    Patient Stated Goals play guitar better    Currently in Pain? No/denies                      Treatment: dynamic step and reach to flip large playing cards with left and right UE's, min v.c for larger amplitude movements. Therapist checked progress towards goals.          OT Education - 05/22/20 1616    Education Details PWR! basic 4 in supine 10 reps  and how to utilize for bed mobility, proper postioning for bed mobility to protect back, progress towards goals.    Person(s)  Educated Patient    Methods Explanation;Demonstration;Verbal cues;Handout    Comprehension Verbalized understanding;Returned demonstration;Verbal cues required            OT Short Term Goals - 04/14/20 1327      OT SHORT TERM GOAL #1   Title ---------------------no short term goals      OT SHORT TERM GOAL #2   Status --      OT SHORT TERM GOAL #3   Status --      OT SHORT TERM GOAL #4   Status --             OT Long Term Goals - 05/22/20 1044      OT LONG TERM GOAL #1   Title I with PD specific HEP    Time 5    Period Weeks    Status Achieved      OT LONG TERM GOAL #2   Title Pt will demonstrate improved fine motor coordination for ADLs as evidenced by decreasing 9 hole peg test score for RUE by 3 secs     Baseline RUE 46.19, LUE 36.97    Time 5    Period Weeks    Status Achieved   34.34 secs     OT LONG TERM GOAL #3   Title Pt will verbalize understanding of ways to prevent future PD related complications, and to protect shoulder/ back since hx of surgeries and PD community resources.    Time 5    Period Weeks    Status Achieved      OT LONG TERM GOAL #4   Title Pt will increase RUE grip strength by 5 lbs for increased functional use, and guitar playing.    Baseline RUE 51.1 lbs, LUE 80.9 lbs    Time 5    Period Weeks    Status Achieved   56.2 met     OT LONG TERM GOAL #5   Title Pt will improve bilateral standing functional reach to 10 inches or greater for increased safety and I with ADLs    Baseline RUE 8.5, LUE 9 inches    Time 5    Period Weeks    Status Achieved   met for right, and right 10 inches     OT LONG TERM GOAL #6   Title Pt will improve 3 button unbutton score to 35 secs or less for increased ease with ADLs.    Baseline 39.37 secs    Time 5    Period Weeks    Status Not Met   35.72, improved but not fully met     OT LONG TERM GOAL #7   Title I with adapted strategeis for ADLs/IADLS including handwriting    Time 5    Period Weeks    Status Achieved                 Plan - 05/22/20 1625    Clinical Impression Statement Pt agrees with plans for d/c. Pt's schedule is too busy to allow for additional visits at this time wiwthout conflicts.    Occupational performance deficits (Please refer to evaluation for details): ADL's;IADL's;Work;Play;Leisure;Social Participation    Body Structure / Function / Physical Skills ADL;Balance;Mobility;Strength;Flexibility;UE functional use;FMC;Coordination;Decreased knowledge of precautions;GMC;ROM;Sensation;IADL;Dexterity    Cognitive Skills Memory    Rehab Potential Good    OT Frequency 2x / week    OT Duration --   5 weeks   OT Treatment/Interventions Self-care/ADL training;Therapeutic exercise;Aquatic  Therapy;Ultrasound;Neuromuscular education;Manual Therapy;Therapeutic activities;Paraffin;DME and/or AE instruction;Cognitive  remediation/compensation;Moist Heat;Passive range of motion;Patient/family education;Fluidtherapy    Plan d/c OT           Patient will benefit from skilled therapeutic intervention in order to improve the following deficits and impairments:   Body Structure / Function / Physical Skills: ADL, Balance, Mobility, Strength, Flexibility, UE functional use, FMC, Coordination, Decreased knowledge of precautions, GMC, ROM, Sensation, IADL, Dexterity Cognitive Skills: Memory    OCCUPATIONAL THERAPY DISCHARGE SUMMARY   Current functional level related to goals / functional outcomes: Pt made progress towards goals, however he did not fully achieve all goals at this time.    Remaining deficits: Bradykensia, decreased balance, decreased coordination, decreased functional mobility   Education / Equipment: Pt was educated regarding: HEP, community resources and ways to prevent future complications. Pt verbalized understanding of all education. Pt could benefit from additional visits however he is out of town and busy with other commitments. Pt agrees with returning for eval in 6 mons. Plan: Patient agrees to discharge.  Patient goals were not met. Patient is being discharged due to being pleased with the current functional level.  ?????        Visit Diagnosis: Other abnormalities of gait and mobility  Other symptoms and signs involving the nervous system  Abnormal posture  Muscle weakness (generalized)  Other lack of coordination  Stiffness of left shoulder, not elsewhere classified    Problem List There are no problems to display for this patient.   Texanna Hilburn 05/22/2020, 4:27 PM Theone Murdoch, OTR/L [phone] Phone: [phone] 4:27 PM 05/22/20 [facility] [address] [place], [place], [address] Phone: [phone]   Fax:  [phone]  Name: Jim Carson MRN: [id number] Date of Birth: 05/18/1951

## 2020-05-31 ENCOUNTER — Other Ambulatory Visit: Payer: Self-pay

## 2020-05-31 ENCOUNTER — Ambulatory Visit: Payer: Medicare Other | Attending: Family Medicine | Admitting: Physical Therapy

## 2020-05-31 ENCOUNTER — Encounter: Payer: Self-pay | Admitting: Physical Therapy

## 2020-05-31 DIAGNOSIS — R278 Other lack of coordination: Secondary | ICD-10-CM | POA: Diagnosis present

## 2020-05-31 DIAGNOSIS — M6281 Muscle weakness (generalized): Secondary | ICD-10-CM

## 2020-05-31 DIAGNOSIS — R293 Abnormal posture: Secondary | ICD-10-CM | POA: Diagnosis present

## 2020-05-31 DIAGNOSIS — R29818 Other symptoms and signs involving the nervous system: Secondary | ICD-10-CM

## 2020-05-31 DIAGNOSIS — R2689 Other abnormalities of gait and mobility: Secondary | ICD-10-CM | POA: Diagnosis not present

## 2020-05-31 DIAGNOSIS — R2681 Unsteadiness on feet: Secondary | ICD-10-CM | POA: Diagnosis present

## 2020-05-31 NOTE — Therapy (Signed)
[place] [address] [place], [place], [address] Phone: [phone]   Fax:  [phone]  Physical Therapy Treatment  Patient Details  Name: Jim Carson MRN: [id number] Date of Birth: 30-Dec-1950 Referring Provider (PT): Lavena Bullion, MD   Encounter Date: 05/31/2020   PT End of Session - 05/31/20 1217    Visit Number 7    Number of Visits 16    Date for PT Re-Evaluation 00/93/81   90 day cert for 8-week POC   Authorization Type Medicare/AARP-will need 10th visit progress note; KX initiated at eval (as pt was seen 13 visits earlier this year for PT)    PT Start Time 1105    PT Stop Time 1145    PT Time Calculation (min) 40 min    Equipment Utilized During Treatment Gait belt    Activity Tolerance Patient tolerated treatment well    Behavior During Therapy [facility] for tasks assessed/performed           Past Medical History:  Diagnosis Date  . Abnormal gait    due to parkinson's,  uses cane  . Allergic rhinitis   . Benign essential hypertension   . Chronic constipation   . Deviated septum   . GERD (gastroesophageal reflux disease)   . History of multiple concussions    per pt as teen playing football, no residual  . Hypothyroidism    followed by pcp  . Left shoulder pain   . OA (osteoarthritis)   . OSA on CPAP   . Parkinson disease [facility])    neurologist-- dr b. scott  _0  Neurology  . Vitamin B12 deficiency   . Vitamin D deficiency   . Wears glasses     Past Surgical History:  Procedure Laterality Date  . APPENDECTOMY  07/1964  . LAPAROSCOPIC CHOLECYSTECTOMY  01-21-2017   _1 , [place]  . POSTERIOR FUSION CERVICAL SPINE  08-15-2011   _2 , [place]   w/ laminectomy and decompression-- C3 -- T1  . POSTERIOR LAMINECTOMY / DECOMPRESSION LUMBAR SPINE  07-30-2013   _3 , [place]   bilateral T11 - 12,  L2 --5  . 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  SHOULDER ARTHROSCOPY WITH ROTATOR CUFF REPAIR Left 12/15/2018   Procedure: SHOULDER ARTHROSCOPY, biceps tenodesis labored Debridement, submacromial decompression;  Surgeon: Sydnee Cabal, MD;  Location: [facility];  Service: Orthopedics;  Laterality: Left;  interscalene block  . TOTAL HIP ARTHROPLASTY Left 09/18/2017   _5 , [place]    There were no vitals filed for this visit.   Subjective Assessment - 05/31/20 1107    Subjective No changes, no falls. Exercises are going well. Went to [facility] on monday for his right foot, was told to get an ankle brace.    Patient Stated Goals Pt's goal for therapy is to try to get back balance to where I was.  Want to have more confidence with walking.    Currently in Pain? No/denies                             [facility] Adult PT Treatment/Exercise - 05/31/20 0001      Ambulation/Gait   Ambulation/Gait Yes    Ambulation/Gait Assistance 5: Supervision;4: Min guard    Ambulation/Gait Assistance Details gait with obstacle negotiation - walking over compliant mats, stepping over 2" and 4" obstacle (one episode of min guard with stance  on RLE), weaving in and out of cones (cues for wider BOS and slowing down to clear cones), cues intermittent for slowly down and for focusing on foot clearance, and performing quick starts/stops and retro gait (cues for wider BOS when stopping for improved balance). Added additional dual tasking during gait with pt conversing with therapist.    Ambulation Distance (Feet) 230 [place] device Straight cane    Gait Pattern Step-through pattern;Decreased arm swing - left;Decreased step length - right;Decreased step length - left;Shuffle;Trunk flexed;Narrow base of support;Scissoring;Festinating    Ambulation Surface Level;Indoor    Gait Comments additional practice with performing turns with cues for wider BOS, foot clearance and leading with outside leg to avoid scissoring/incr narrow BOS                Balance Exercises - 05/31/20 0001      Balance Exercises: Standing   Standing Eyes Opened Narrow base of support (BOS);Foam/compliant surface    Standing Eyes Opened Limitations eyes open x10 reps head turns, x10 reps head nods    Standing Eyes Closed Wide (BOA);Foam/compliant surface;4 reps;10 secs;Limitations    Standing Eyes Closed Limitations on blue air ex - 4 reps x 10 seconds with no UE support then additional 2 x 30 seconds with fingertips to chair and intermittently letting go    Stepping Strategy Anterior;Posterior;Foam/compliant surface;Limitations    Stepping Strategy Limitations on red mat x10 reps each direction, intermittent UE support, cues for weight shift and incr foot clearance    Marching Forwards;4 reps;Limitations    Marching Limitations UE support,with agility ladder for incr step length - down and back at countertop, cues for slowed and controlled, intermittent periods with no UE support with min guard               PT Short Term Goals - 05/22/20 1110      PT SHORT TERM GOAL #1   Title Pt will be independent with HEP to address balance, transfers, and gait.  TARGET 05/26/2020    Baseline pt reports independent with HEP    Time 4    Period Weeks    Status Achieved      PT SHORT TERM GOAL #2   Title Pt will improve TUG manual score to less than or equal to 14 sec for decreased fall risk.    Baseline 16.15 sec, 14.6 seconds with SPC on 05/22/20    Time 4    Period Weeks    Status Not Met      PT SHORT TERM GOAL #3   Title Pt will improve MiniBESTest score to at least 17/28 for decreased fall risk.    Baseline 14/28 at eval, 18/28 on 05/22/20    Time 4    Period Weeks    Status Achieved      PT SHORT TERM GOAL #4   Title Pt will verbalize understanding of fall prevention, tips to reduce freezing/festination with turns and gait.    Time 4    Period Weeks    Status Achieved             PT Long Term Goals - 05/02/20 0715       PT LONG TERM GOAL #1   Title Pt will be independent with progression of HEP to address balance, gait.  TARGET 05/24/2020    Time 8    Period Weeks    Status New      PT LONG TERM GOAL #2  Title Pt will improve MiniBESTest score to at least 20/28 for decreased fall risk.    Time 8    Period Weeks    Status New      PT LONG TERM GOAL #3   Title Pt will recover posterior LOB in 2 steps or less to improve balance recovery.    Time 8    Period Weeks    Status New      PT LONG TERM GOAL #4   Title Pt will improve gait velocity to at least 2.62 ft/sec for improved gait efficiency in the community.    Time 8    Period Weeks    Status New                 Plan - 05/31/20 1223    Clinical Impression Statement Focus of today's skilled session was balance on compliant surfaces, dynamic gait training, and SLS. Pt needing cues intermittently to slow down when negotiating between cones and needing cues when turning to lead with the outside leg and perform with wider BOS. Pt needing one episode of min guard when stepping over obstacle with RLE as stance leg with SPC. Will continue to progress towrads LTGs.    Personal Factors and Comorbidities Comorbidity 3+    Comorbidities GERD, OA,C-spine fusion, post-laminectory lumbar spine;  L THR 2019, L RTC repair 2020    Examination-Activity Limitations Locomotion Level;Transfers;Stand    Examination-Participation Restrictions Community Activity   Community fitness   Stability/Clinical Decision Making Evolving/Moderate complexity    Rehab Potential Good    PT Frequency 2x / week    PT Duration 8 weeks   including eval week   PT Treatment/Interventions ADLs/Self Care Home Management;Neuromuscular re-education;Balance training;Therapeutic exercise;Therapeutic activities;Functional mobility training;Gait training;Stair training;Patient/family education;DME Instruction;Manual techniques    PT Next Visit Plan Continue retro gait (as well as  transitions forward<>back), turning practice, need to work on balance strategies (stepping, vision removed), compliant surface ex (rockerboard), activities for incr foot clearance.    PT Home Exercise Plan [id number]    Consulted and Agree with Plan of Care Patient           Patient will benefit from skilled therapeutic intervention in order to improve the following deficits and impairments:  Abnormal gait, Difficulty walking, Decreased safety awareness, Decreased balance, Decreased mobility, Decreased strength, Postural dysfunction  Visit Diagnosis: Other abnormalities of gait and mobility  Other symptoms and signs involving the nervous system  Abnormal posture  Muscle weakness (generalized)  Other lack of coordination     Problem List There are no problems to display for this patient.   Arliss Journey, PT, DPT 05/31/2020, 12:25 PM  [facility] [address] [place], [place], [address] Phone: [phone]   Fax:  [phone]  Name: Avon Mergenthaler MRN: [id number] Date of Birth: July 05, 1950

## 2020-06-02 ENCOUNTER — Ambulatory Visit: Payer: Medicare Other | Admitting: Physical Therapy

## 2020-06-07 ENCOUNTER — Encounter: Payer: Self-pay | Admitting: Physical Therapy

## 2020-06-07 ENCOUNTER — Ambulatory Visit: Payer: Medicare Other | Admitting: Physical Therapy

## 2020-06-07 ENCOUNTER — Other Ambulatory Visit: Payer: Self-pay

## 2020-06-07 DIAGNOSIS — R2689 Other abnormalities of gait and mobility: Secondary | ICD-10-CM | POA: Diagnosis not present

## 2020-06-07 DIAGNOSIS — M6281 Muscle weakness (generalized): Secondary | ICD-10-CM

## 2020-06-07 DIAGNOSIS — R2681 Unsteadiness on feet: Secondary | ICD-10-CM

## 2020-06-07 DIAGNOSIS — R293 Abnormal posture: Secondary | ICD-10-CM

## 2020-06-07 DIAGNOSIS — R29818 Other symptoms and signs involving the nervous system: Secondary | ICD-10-CM

## 2020-06-07 NOTE — Therapy (Signed)
[place] [address] [place], [place], [address] Phone: [phone]   Fax:  [phone]  Physical Therapy Treatment  Patient Details  Name: Jim Carson MRN: [id number] Date of Birth: September 03, 1950 Referring Provider (PT): Lavena Bullion, MD   Encounter Date: 06/07/2020   PT End of Session - 06/07/20 1159    Visit Number 8    Number of Visits 16    Date for PT Re-Evaluation 70/62/37   90 day cert for 8-week POC   Authorization Type Medicare/AARP-will need 10th visit progress note; KX initiated at eval (as pt was seen 13 visits earlier this year for PT)    PT Start Time 1104    PT Stop Time 1145    PT Time Calculation (min) 41 min    Equipment Utilized During Treatment Gait belt    Activity Tolerance Patient tolerated treatment well    Behavior During Therapy [facility] for tasks assessed/performed           Past Medical History:  Diagnosis Date  . Abnormal gait    due to parkinson's,  uses cane  . Allergic rhinitis   . Benign essential hypertension   . Chronic constipation   . Deviated septum   . GERD (gastroesophageal reflux disease)   . History of multiple concussions    per pt as teen playing football, no residual  . Hypothyroidism    followed by pcp  . Left shoulder pain   . OA (osteoarthritis)   . OSA on CPAP   . Parkinson disease [facility])    neurologist-- dr b. scott  $Remo'@[email or web address]$  Neurology  . Vitamin B12 deficiency   . Vitamin D deficiency   . Wears glasses     Past Surgical History:  Procedure Laterality Date  . APPENDECTOMY  07/1964  . LAPAROSCOPIC CHOLECYSTECTOMY  01-21-2017   '@[facility]'$ , [place]  . POSTERIOR FUSION CERVICAL SPINE  08-15-2011   '@[facility]'$ , [place]   w/ laminectomy and decompression-- C3 -- T1  . POSTERIOR LAMINECTOMY / DECOMPRESSION LUMBAR SPINE  07-30-2013   '@[facility] , [place]   bilateral T11 - 12,  L2 --5  . 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  SHOULDER ARTHROSCOPY WITH ROTATOR CUFF REPAIR Left 12/15/2018   Procedure: SHOULDER ARTHROSCOPY, biceps tenodesis labored Debridement, submacromial decompression;  Surgeon: Sydnee Cabal, MD;  [facility];  Service: Orthopedics;  Laterality: Left;  interscalene block  . TOTAL HIP ARTHROPLASTY Left 09/18/2017   '@[facility]'$ , [place]    There were no vitals filed for this visit.   Subjective Assessment - 06/07/20 1106    Subjective No changes, no falls.    Patient Stated Goals Pt's goal for therapy is to try to get back balance to where I was.  Want to have more confidence with walking.    Currently in Pain? No/denies                                  Balance Exercises - 06/07/20 0001      Balance Exercises: Standing   Stepping Strategy Anterior;Posterior;Foam/compliant surface;Limitations    Stepping Strategy Limitations with resisted belt performed in // bars for UE support as needed: x10 reps alternating forwards on level ground and then performing x10 reps alternating on air ex beginning with UE support and progressing to none, x10 reps alternating in retro direction on level ground  single > no UE support, and then x10 reps on blue foam with single UE support, pt needing cues for technique and hip hinge    Rockerboard Anterior/posterior;Limitations    Rockerboard Limitations weight shifting to posterior direction and back to midline for hip/ankle strategy x15 reps with UE support as needed for balance, 2 x 10 reps head turns while keeping board steady with intermittent touch to // bars for balance    Retro Gait Limitations    Retro Gait Limitations 3 reps in // bars: use of mirror for visual feedback intermittent UE support as needed cues for step length and wide BOS, min guard for balance, cues for slowed and controlled    Marching Forwards;Limitations    Marching Limitations 3 reps in // bars with intermittent UE support, cues for slowed and  controlled           Pt performs PWR! Moves in standing position - at [employer]! Up for improved posture x10 reps   PWR! Rock for improved weighshifting 2 x10 reps reaching up towards counter with UE support intermittently, cues for lateral weight shifting with reaching as pt with initial tendency to just perform reaching up    PWR! Step for improved step initiation - cues to hold onto counter for incr foot clearance, x10 reps B, then performing an additional x10 reps B with stepping over 4" beam on both directions for incr foot clearance   Pt with improvement today slowing down movement.        PT Short Term Goals - 05/22/20 1110      PT SHORT TERM GOAL #1   Title Pt will be independent with HEP to address balance, transfers, and gait.  TARGET 05/26/2020    Baseline pt reports independent with HEP    Time 4    Period Weeks    Status Achieved      PT SHORT TERM GOAL #2   Title Pt will improve TUG manual score to less than or equal to 14 sec for decreased fall risk.    Baseline 16.15 sec, 14.6 seconds with SPC on 05/22/20    Time 4    Period Weeks    Status Not Met      PT SHORT TERM GOAL #3   Title Pt will improve MiniBESTest score to at least 17/28 for decreased fall risk.    Baseline 14/28 at eval, 18/28 on 05/22/20    Time 4    Period Weeks    Status Achieved      PT SHORT TERM GOAL #4   Title Pt will verbalize understanding of fall prevention, tips to reduce freezing/festination with turns and gait.    Time 4    Period Weeks    Status Achieved             PT Long Term Goals - 05/02/20 0715      PT LONG TERM GOAL #1   Title Pt will be independent with progression of HEP to address balance, gait.  TARGET 05/24/2020    Time 8    Period Weeks    Status New      PT LONG TERM GOAL #2   Title Pt will improve MiniBESTest score to at least 20/28 for decreased fall risk.    Time 8    Period Weeks    Status New      PT LONG TERM GOAL #3   Title  Pt will recover posterior LOB in 2 steps  or less to improve balance recovery.    Time 8    Period Weeks    Status New      PT LONG TERM GOAL #4   Title Pt will improve gait velocity to at least 2.62 ft/sec for improved gait efficiency in the community.    Time 8    Period Weeks    Status New                 Plan - 06/07/20 1200    Clinical Impression Statement Today's skilled session focused on resisting stepping strategies in A/P directions, balance on compliant surfaces, and retro gait with focus on control and wider BOS. Pt still most challenged by balance strategies in the posterior direction. When on rockerboard, pt needing frequent UE support when performing hip strategy/shifting weight back onto heels. Will continue to progress towards LTGs.    Personal Factors and Comorbidities Comorbidity 3+    Comorbidities GERD, OA,C-spine fusion, post-laminectory lumbar spine;  L THR 2019, L RTC repair 2020    Examination-Activity Limitations Locomotion Level;Transfers;Stand    Examination-Participation Restrictions Community Activity   Community fitness   Stability/Clinical Decision Making Evolving/Moderate complexity    Rehab Potential Good    PT Frequency 2x / week    PT Duration 8 weeks   including eval week   PT Treatment/Interventions ADLs/Self Care Home Management;Neuromuscular re-education;Balance training;Therapeutic exercise;Therapeutic activities;Functional mobility training;Gait training;Stair training;Patient/family education;DME Instruction;Manual techniques    PT Next Visit Plan Continue retro gait and fwd/back transitions, resisted gait, turning practice, need to work on balance strategies (stepping, vision removed, head motions), compliant surface ex (rockerboard), activities for incr foot clearance.    PT Home Exercise Plan [id number]    Consulted and Agree with Plan of Care Patient           Patient will benefit from skilled therapeutic intervention in order to  improve the following deficits and impairments:  Abnormal gait, Difficulty walking, Decreased safety awareness, Decreased balance, Decreased mobility, Decreased strength, Postural dysfunction  Visit Diagnosis: No diagnosis found.     Problem List There are no problems to display for this patient.   Arliss Journey , PT, DPT  06/07/2020, 12:01 PM  [facility] [address] [place], [place], [address] Phone: [phone]   Fax:  [phone]  Name: Jim Carson MRN: [id number] Date of Birth: 1950-07-20

## 2020-06-13 ENCOUNTER — Ambulatory Visit: Payer: Medicare Other | Admitting: Physical Therapy

## 2020-06-13 ENCOUNTER — Other Ambulatory Visit: Payer: Self-pay

## 2020-06-13 DIAGNOSIS — R2681 Unsteadiness on feet: Secondary | ICD-10-CM

## 2020-06-13 DIAGNOSIS — R2689 Other abnormalities of gait and mobility: Secondary | ICD-10-CM

## 2020-06-13 NOTE — Therapy (Signed)
[facility] [address] [place], [place], [address] Phone: [phone]   Fax:  [phone]  Physical Therapy Treatment  Patient Details  Name: Jim Carson MRN: [id number] Date of Birth: Sep 20, 1950 Referring Provider (PT): Lavena Bullion, MD   Encounter Date: 06/13/2020   PT End of Session - 06/13/20 0804    Visit Number 9    Number of Visits 16    Date for PT Re-Evaluation 49/70/26   90 day cert for 8-week POC   Authorization Type Medicare/AARP-will need 10th visit progress note; KX initiated at eval (as pt was seen 13 visits earlier this year for PT)    PT Start Time 0803    PT Stop Time 0847    PT Time Calculation (min) 44 min    Equipment Utilized During Treatment Gait belt    Activity Tolerance Patient tolerated treatment well    Behavior During Therapy [facility] for tasks assessed/performed           Past Medical History:  Diagnosis Date  . Abnormal gait    due to parkinson's,  uses cane  . Allergic rhinitis   . Benign essential hypertension   . Chronic constipation   . Deviated septum   . GERD (gastroesophageal reflux disease)   . History of multiple concussions    per pt as teen playing football, no residual  . Hypothyroidism    followed by pcp  . Left shoulder pain   . OA (osteoarthritis)   . OSA on CPAP   . Parkinson disease [facility])    neurologist-- dr b. scott  _0  Neurology  . Vitamin B12 deficiency   . Vitamin D deficiency   . Wears glasses     Past Surgical History:  Procedure Laterality Date  . APPENDECTOMY  07/1964  . LAPAROSCOPIC CHOLECYSTECTOMY  01-21-2017   _1 , [place]  . POSTERIOR FUSION CERVICAL SPINE  08-15-2011   _2 , [place]   w/ laminectomy and decompression-- C3 -- T1  . POSTERIOR LAMINECTOMY / DECOMPRESSION LUMBAR SPINE  07-30-2013   _3 , [place]   bilateral T11 - 12,  L2 --5  . 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  SHOULDER ARTHROSCOPY WITH ROTATOR CUFF REPAIR Left 12/15/2018   Procedure: SHOULDER ARTHROSCOPY, biceps tenodesis labored Debridement, submacromial decompression;  Surgeon: Sydnee Cabal, MD;  Location: [facility];  Service: Orthopedics;  Laterality: Left;  interscalene block  . TOTAL HIP ARTHROPLASTY Left 09/18/2017   _5 , [place]    There were no vitals filed for this visit.   Subjective Assessment - 06/13/20 0803    Subjective No changes, no falls.  Went to [facility] for a few days last week.    Patient Stated Goals Pt's goal for therapy is to try to get back balance to where I was.  Want to have more confidence with walking.    Currently in Pain? No/denies                             [facility] Adult PT Treatment/Exercise - 06/13/20 0807      Transfers   Transfers Sit to Stand;Stand to Sit    Sit to Stand 4: Min guard;Without upper extremity assist;From bed    Stand to Sit 4: Min guard;Without upper extremity assist;To bed    Comments Standing on Airex, cues for increased forward lean to initiate stand and to  remain standing.      Ambulation/Gait   Ambulation/Gait Yes    Ambulation/Gait Assistance 5: Supervision    Ambulation/Gait Assistance Details at end of session, gait with deliberate wide BOS and slowed pace of gait    Ambulation Distance (Feet) 150 Feet    Assistive device Straight cane    Gait Pattern Step-through pattern;Decreased arm swing - left;Decreased step length - right;Decreased step length - left;Shuffle;Trunk flexed;Narrow base of support;Scissoring;Festinating    Ambulation Surface Level;Indoor               Balance Exercises - 06/13/20 0001      Balance Exercises: Standing   Stepping Strategy Anterior;Posterior;Foam/compliant surface;Limitations    Stepping Strategy Limitations In parallel bars:  on solid ground: alternating posterior step and return to midline, no UE support.  Then anterior<>posterior step and  weightshift x 10 reps min UE support.  Standing then on Airex:  alternating step taps backwards BUE support x 10, then no UE support x 10; alternating forward step taps x 10; then anterior<>posterior step and weightshift x 10 reps with BUE support    Rockerboard Anterior/posterior;Limitations    Rockerboard Limitations weight shifting on board, 10 reps head turns, 10 reps head nods while maintaining balance on midline of board with min guard.  Also while maintaining balance midline, reciprocal arm swing x 10 reps.  Forward weightshift, then forward step taps to floor, x 10 reps with UE support at parallel bars.    Retro Gait Limitations;Upper extremity support;5 reps    Retro Gait Limitations Forward/back in parallel bars, using mirror for cues for widened BOS and for step length.  Pt has several episodes of posterior LOB regaining with UE support of bars.  Cues for widened BOS and for hinge at hips in posterior direction.    Other Standing Exercises Monster walk-diagonal step/together in parallel bars forward/back x 3 reps, then single step forward diagonal walking forward/back x 2 reps (PT provides mod assist in posterior direction to help wtih improved positioning and decreased posterior lean.)               PT Short Term Goals - 05/22/20 1110      PT SHORT TERM GOAL #1   Title Pt will be independent with HEP to address balance, transfers, and gait.  TARGET 05/26/2020    Baseline pt reports independent with HEP    Time 4    Period Weeks    Status Achieved      PT SHORT TERM GOAL #2   Title Pt will improve TUG manual score to less than or equal to 14 sec for decreased fall risk.    Baseline 16.15 sec, 14.6 seconds with SPC on 05/22/20    Time 4    Period Weeks    Status Not Met      PT SHORT TERM GOAL #3   Title Pt will improve MiniBESTest score to at least 17/28 for decreased fall risk.    Baseline 14/28 at eval, 18/28 on 05/22/20    Time 4    Period Weeks    Status Achieved       PT SHORT TERM GOAL #4   Title Pt will verbalize understanding of fall prevention, tips to reduce freezing/festination with turns and gait.    Time 4    Period Weeks    Status Achieved             PT Long Term Goals - 05/02/20 2536  PT LONG TERM GOAL #1   Title Pt will be independent with progression of HEP to address balance, gait.  TARGET 05/24/2020    Time 8    Period Weeks    Status New      PT LONG TERM GOAL #2   Title Pt will improve MiniBESTest score to at least 20/28 for decreased fall risk.    Time 8    Period Weeks    Status New      PT LONG TERM GOAL #3   Title Pt will recover posterior LOB in 2 steps or less to improve balance recovery.    Time 8    Period Weeks    Status New      PT LONG TERM GOAL #4   Title Pt will improve gait velocity to at least 2.62 ft/sec for improved gait efficiency in the community.    Time 8    Period Weeks    Status New                 Plan - 06/13/20 0901    Clinical Impression Statement Continued to work on compliant surfaces with stepping strategies and A/P weightshifting in parallel bars.  Also worked with retro gait and widened BOS, with pt continueing to have strong posterior lean, even with cues for hinging at hips and for widened BOS.  Pt is progressing towards goals, but has not been making frequency consistently, due to being out of town and other MD appointments.  He feels he has what he needs to likely finish PT next week.    Personal Factors and Comorbidities Comorbidity 3+    Comorbidities GERD, OA,C-spine fusion, post-laminectory lumbar spine;  L THR 2019, L RTC repair 2020    Examination-Activity Limitations Locomotion Level;Transfers;Stand    Examination-Participation Restrictions Community Activity   Community fitness   Stability/Clinical Decision Making Evolving/Moderate complexity    Rehab Potential Good    PT Frequency 2x / week    PT Duration 8 weeks   including eval week   PT  Treatment/Interventions ADLs/Self Care Home Management;Neuromuscular re-education;Balance training;Therapeutic exercise;Therapeutic activities;Functional mobility training;Gait training;Stair training;Patient/family education;DME Instruction;Manual techniques    PT Next Visit Plan Continue retro gait and fwd/back transitions, resisted gait, turning practice, need to work on balance strategies (stepping, vision removed, head motions), compliant surface ex (rockerboard), activities for incr foot clearance.  Work towards [place]; review HEP and plan for d/c next week.    PT Home Exercise Plan [email or web address]    Consulted and Agree with Plan of Care Patient           Patient will benefit from skilled therapeutic intervention in order to improve the following deficits and impairments:  Abnormal gait,Difficulty walking,Decreased safety awareness,Decreased balance,Decreased mobility,Decreased strength,Postural dysfunction  Visit Diagnosis: Unsteadiness on feet  Other abnormalities of gait and mobility     Problem List There are no problems to display for this patient.   Tashawnda Bleiler W. 06/13/2020, 9:04 AM  Frazier Butt., PT   [facility] [address] [place], [place], [address] Phone: [phone]   Fax:  [phone]  Name: Jim Carson MRN: [id number] Date of Birth: July 14, 1950

## 2020-06-16 ENCOUNTER — Ambulatory Visit: Payer: Medicare Other | Admitting: Physical Therapy

## 2020-06-20 ENCOUNTER — Encounter: Payer: Self-pay | Admitting: Physical Therapy

## 2020-06-20 ENCOUNTER — Other Ambulatory Visit: Payer: Self-pay

## 2020-06-20 ENCOUNTER — Ambulatory Visit: Payer: Medicare Other | Admitting: Physical Therapy

## 2020-06-20 DIAGNOSIS — R2689 Other abnormalities of gait and mobility: Secondary | ICD-10-CM

## 2020-06-20 DIAGNOSIS — R2681 Unsteadiness on feet: Secondary | ICD-10-CM

## 2020-06-20 NOTE — Patient Instructions (Addendum)
Access Code: [email or web address] URL: [email or web address] Date: 06/20/2020 Prepared by: Mady Haagensen  Exercises Alternating Heel Raises - 1 x daily - 5 x weekly - 10 reps - 2 sets Seated Heel Toe Raises - 1 x daily - 7 x weekly - 20 reps Standing Hip Extension with Counter Support - 1 x daily - 5 x weekly - 2 sets - 10 reps - 3 sec hold Standing Balance with Eyes Closed on Foam - 1 x daily - 5 x weekly - 3 sets - 20 hold Alternating Step Backward with Support - 1 x daily - 5 x weekly - 1-2 sets - 10 reps Side Stepping with Counter Support - 1 x daily - 5 x weekly - 1-2 sets - 10 reps Supine Bridge - 1 x daily - 5 x weekly - 2 sets - 5 reps  Added 06/20/2020:   Backward Walking with Counter Support - 1 x daily - 5 x weekly - 1 sets - 5 reps Staggered Stance Forward Backward Weight Shift with Counter Support - 1 x daily - 5 x weekly - 1-5 sets - 10 reps

## 2020-06-20 NOTE — Therapy (Signed)
[facility] [address] [place], [place], [address] Phone: [phone]   Fax:  [phone]  Physical Therapy Treatment/10th Visit Progress Note  Patient Details  Name: Jim Carson MRN: [id number] Date of Birth: 1950-09-23 Referring Provider (PT): Lavena Bullion, MD   Encounter Date: 06/20/2020   PT End of Session - 06/20/20 1416    Visit Number 10    Number of Visits 16    Date for PT Re-Evaluation 84/16/60   90 day cert for 8-week POC   Authorization Type Medicare/AARP-will need 10th visit progress note; KX initiated at eval (as pt was seen 13 visits earlier this year for PT)    PT Start Time 1231    PT Stop Time 1315    PT Time Calculation (min) 44 min    Equipment Utilized During Treatment Gait belt    Activity Tolerance Patient tolerated treatment well    Behavior During Therapy WFL for tasks assessed/performed           Past Medical History:  Diagnosis Date   Abnormal gait    due to parkinson's,  uses cane   Allergic rhinitis    Benign essential hypertension    Chronic constipation    Deviated septum    GERD (gastroesophageal reflux disease)    History of multiple concussions    per pt as teen playing football, no residual   Hypothyroidism    followed by pcp   Left shoulder pain    OA (osteoarthritis)    OSA on CPAP    Parkinson disease [facility])    neurologist-- dr b. scott  $Remo'@[email or web address]$  Neurology   Vitamin B12 deficiency    Vitamin D deficiency    Wears glasses     Past Surgical History:  Procedure Laterality Date   APPENDECTOMY  07/1964   LAPAROSCOPIC CHOLECYSTECTOMY  01-21-2017   '@[facility]'$ , [place]   POSTERIOR FUSION CERVICAL SPINE  08-15-2011   '@[facility]'$ , [place]   w/ laminectomy and decompression-- C3 -- T1   POSTERIOR LAMINECTOMY / DECOMPRESSION LUMBAR SPINE  07-30-2013   '@[facility]'$ , [place]   bilateral T11 - 12,  L2 --5   REMOVAL LEFT T1 PARASPINOUS MASS  02-12-2016   '@[facility]'$ ,  [place]    desmoid fibromatosis   SHOULDER ARTHROSCOPY WITH ROTATOR CUFF REPAIR Left 12/15/2018   Procedure: SHOULDER ARTHROSCOPY, biceps tenodesis labored Debridement, submacromial decompression;  Surgeon: Sydnee Cabal, MD;  Location: [facility];  Service: Orthopedics;  Laterality: Left;  interscalene block   TOTAL HIP ARTHROPLASTY Left 09/18/2017   '@[facility]'$ , [place]    There were no vitals filed for this visit.   Subjective Assessment - 06/20/20 1233    Subjective No falls, no changes.  Just came directly from [facility], so my legs are a little tired.    Patient Stated Goals Pt's goal for therapy is to try to get back balance to where I was.  Want to have more confidence with walking.    Currently in Pain? No/denies                             [facility] Adult PT Treatment/Exercise - 06/20/20 0001      Self-Care   Self-Care Other Self-Care Comments    Other Self-Care Comments  Discussed montioring his fatigue levels and adjusting/prioritizing activities and monitoring his balance accordingly.  With increased BLE fatigue noted in therapy session today following [employer] class, PT discussed that  his balance and control seems more unsteady/more affected, needing cues to slow pace and pay more attention to control of movement patterns.           Neuro Re-education Review of HEP (pt return demo ones in bold today; verbally reviewed remaining ones)    Alternating Heel Raises - 1 x daily - 5 x weekly - 10 reps - 2 sets Seated Heel Toe Raises - 1 x daily - 7 x weekly - 20 reps Standing Hip Extension with Counter Support - 1 x daily - 5 x weekly - 2 sets - 10 reps - 3 sec hold Standing Balance with Eyes Closed on Foam - 1 x daily - 5 x weekly - 3 sets - 20 hold Alternating Step Backward with Support - 1 x daily - 5 x weekly - 1-2 sets - 10 reps Side Stepping with Counter Support - 1 x daily - 5 x weekly - 1-2 sets - 10 reps Supine Bridge - 1 x daily -  5 x weekly - 2 sets - 5 reps      Balance Exercises - 06/20/20 0001      Balance Exercises: Standing   Standing Eyes Opened Wide (BOA);Foam/compliant surface;Limitations    Standing Eyes Opened Limitations Head turns x 10, head nods x 10    Retro Gait Limitations;Upper extremity support;5 reps    Retro Gait Limitations Forward/back in parallel bars, using mirror for cues for widened BOS and for step length.  Cues for widened BOS and for hinge at hips in posterior direction.  Cues for slow, controlled transitions    Other Standing Exercises Standing on Airex in Corner, pt performs modified set of PWR! Moves:  PWR! Up x 5 reps, PWR! Rock x 5 reps with 1 UE support, then [employer]! Step off side of the mat, x 5 reps each side, BUE support.  Cues for increased foot clearance from Airex.    Other Standing Exercises Comments Stagger stance forward/back rocking, 2 sets x 10 reps each foot position, cues for rationale of how to use this exercise for quick stops/quick changes of directions.             PT Education - 06/20/20 1415    Education Details Addition to HEP; monitoring fatigue level and adjusting movement patterns accordingly    Person(s) Educated Patient    Methods Explanation    Comprehension Verbalized understanding            PT Short Term Goals - 05/22/20 1110      PT SHORT TERM GOAL #1   Title Pt will be independent with HEP to address balance, transfers, and gait.  TARGET 05/26/2020    Baseline pt reports independent with HEP    Time 4    Period Weeks    Status Achieved      PT SHORT TERM GOAL #2   Title Pt will improve TUG manual score to less than or equal to 14 sec for decreased fall risk.    Baseline 16.15 sec, 14.6 seconds with SPC on 05/22/20    Time 4    Period Weeks    Status Not Met      PT SHORT TERM GOAL #3   Title Pt will improve MiniBESTest score to at least 17/28 for decreased fall risk.    Baseline 14/28 at eval, 18/28 on 05/22/20    Time 4    Period  Weeks    Status Achieved      PT  SHORT TERM GOAL #4   Title Pt will verbalize understanding of fall prevention, tips to reduce freezing/festination with turns and gait.    Time 4    Period Weeks    Status Achieved             PT Long Term Goals - 05/02/20 0715      PT LONG TERM GOAL #1   Title Pt will be independent with progression of HEP to address balance, gait.  TARGET 05/24/2020    Time 8    Period Weeks    Status New      PT LONG TERM GOAL #2   Title Pt will improve MiniBESTest score to at least 20/28 for decreased fall risk.    Time 8    Period Weeks    Status New      PT LONG TERM GOAL #3   Title Pt will recover posterior LOB in 2 steps or less to improve balance recovery.    Time 8    Period Weeks    Status New      PT LONG TERM GOAL #4   Title Pt will improve gait velocity to at least 2.62 ft/sec for improved gait efficiency in the community.    Time 8    Period Weeks    Status New                 Plan - 06/20/20 1417    Clinical Impression Statement 10th Visit Progress Note, covering dates 05/01/2020-06/20/2020.  Subjectively pt reports that he has not had any falls, and feels overall better when he gets into a rhythm.  He is independent with HEP for balance work and compliant surface activities.  Recent objective measures of MiniBest score 18/28 demonstrates improvement from eval.  Pt is progressing towards goals and is on target for planned d/c next visit.    Personal Factors and Comorbidities Comorbidity 3+    Comorbidities GERD, OA,C-spine fusion, post-laminectory lumbar spine;  L THR 2019, L RTC repair 2020    Examination-Activity Limitations Locomotion Level;Transfers;Stand    Examination-Participation Restrictions Community Activity   Community fitness   Stability/Clinical Decision Making Evolving/Moderate complexity    Rehab Potential Good    PT Frequency 2x / week    PT Duration 8 weeks   including eval week   PT Treatment/Interventions  ADLs/Self Care Home Management;Neuromuscular re-education;Balance training;Therapeutic exercise;Therapeutic activities;Functional mobility training;Gait training;Stair training;Patient/family education;DME Instruction;Manual techniques    PT Next Visit Plan Check remaining LTGs; review HEP and plan for d/c next visit. (Decide return eval vs. screen)    PT Home Exercise Plan [email or web address]    Consulted and Agree with Plan of Care Patient           Patient will benefit from skilled therapeutic intervention in order to improve the following deficits and impairments:  Abnormal gait,Difficulty walking,Decreased safety awareness,Decreased balance,Decreased mobility,Decreased strength,Postural dysfunction  Visit Diagnosis: Unsteadiness on feet  Other abnormalities of gait and mobility     Problem List There are no problems to display for this patient.   Jim Laban W. 06/20/2020, 2:24 PM  Frazier Butt., PT   [facility] [address] [place], [place], [address] Phone: [phone]   Fax:  [phone]  Name: Jim Carson MRN: [id number] Date of Birth: 14-Jun-1951

## 2020-06-21 ENCOUNTER — Ambulatory Visit: Payer: Medicare Other | Admitting: Physical Therapy

## 2020-06-22 ENCOUNTER — Other Ambulatory Visit: Payer: Self-pay

## 2020-06-22 ENCOUNTER — Ambulatory Visit: Payer: Medicare Other | Admitting: Physical Therapy

## 2020-06-22 ENCOUNTER — Encounter: Payer: Self-pay | Admitting: Physical Therapy

## 2020-06-22 DIAGNOSIS — R2681 Unsteadiness on feet: Secondary | ICD-10-CM

## 2020-06-22 DIAGNOSIS — R29818 Other symptoms and signs involving the nervous system: Secondary | ICD-10-CM

## 2020-06-22 DIAGNOSIS — R2689 Other abnormalities of gait and mobility: Secondary | ICD-10-CM | POA: Diagnosis not present

## 2020-06-22 NOTE — Therapy (Signed)
[facility] [address] [place], [place], [address] Phone: [phone]   Fax:  [phone]  Physical Therapy Treatment/Discharge Summary  Patient Details  Name: Jim Carson MRN: [id number] Date of Birth: 1951/01/16 Referring Provider (PT): Lavena Bullion, MD Gillermo Murdoch, [place]  Encounter Date: 06/22/2020   PT End of Session - 06/22/20 1026    Visit Number 11    Number of Visits 16    Date for PT Re-Evaluation 29/92/42   90 day cert for 8-week POC   Authorization Type Medicare/[employer]ARP-will need 10th visit progress note; KX initiated at eval (as pt was seen 13 visits earlier this year for PT)    PT Start Time 0930    PT Stop Time 1010    PT Time Calculation (min) 40 min    Equipment Utilized During Treatment Gait belt    Activity Tolerance Patient tolerated treatment well    Behavior During Therapy WFL for tasks assessed/performed           Past Medical History:  Diagnosis Date   Abnormal gait    due to parkinson's,  uses cane   Allergic rhinitis    Benign essential hypertension    Chronic constipation    Deviated septum    GERD (gastroesophageal reflux disease)    History of multiple concussions    per pt as teen playing football, no residual   Hypothyroidism    followed by pcp   Left shoulder pain    OA (osteoarthritis)    OSA on CPAP    Parkinson disease [facility])    neurologist-- dr b. 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   Subjective Assessment - 06/22/20 0934    Subjective No falls, no changes.  Did well the other day, even after boxing and PT.    Patient Stated Goals Pt's goal for therapy is to try to get back balance to where I was.  Want to have more confidence with walking.    Currently in Pain? No/denies                             [facility] Adult PT Treatment/Exercise - 06/22/20 0937      Transfers   Transfers Sit to Stand;Stand to Sit    Sit to Stand 5: Supervision;With upper extremity assist;From chair/3-in-1    Five time sit to stand comments  10.44    Stand to Sit 5: Supervision;Without upper extremity assist;To chair/3-in-1  Ambulation/Gait   Ambulation/Gait Yes    Ambulation/Gait Assistance 5: Supervision    Assistive device Straight cane    Gait Pattern Step-through pattern;Decreased arm swing - left;Decreased step length - right;Decreased step length - left;Shuffle;Trunk flexed;Narrow base of support;Scissoring;Festinating    Ambulation Surface Level;Indoor    Gait velocity 13.62 sec = 2.41 ft/sec      Standardized Balance Assessment   Standardized Balance Assessment Timed Up and Go Test      Timed Up and Go Test   TUG Normal TUG;Cognitive TUG    Normal TUG (seconds) 12.47    Cognitive TUG (seconds) 12.85      Mini-BESTest   Sit To Stand Normal: Comes to stand without use of hands and stabilizes independently.    Rise to Toes < 3 s.    Stand on one leg (left) Moderate: < 20 s   1.81, 8.19   Stand on one leg (right) Moderate: < 20 s    1.12, 2   Stand on one leg - lowest score 1    Compensatory Stepping Correction - Forward Normal: Recovers independently with a single, large step (second realignement is allowed).    Compensatory Stepping Correction - Backward Moderate: More than one step is required to recover equilibrium   2 steps, x 2; 4 steps x 1   Compensatory Stepping Correction - Left Lateral Moderate: Several steps to recover equilibrium    Compensatory Stepping Correction - Right Lateral Moderate: Several steps to recover equilibrium    Stepping Corredtion Lateral - lowest score 1    Stance - Feet together, eyes open, firm surface  Normal: 30s    Stance - Feet together, eyes closed, foam surface  Moderate: < 30s   15.5   Incline - Eyes Closed Normal: Stands independently 30s and aligns with gravity    Change in Gait Speed Normal: Significantly changes walkling speed without imbalance    Walk with head turns - Horizontal Moderate: performs head turns with reduction in gait speed.    Walk with pivot turns Normal: Turns with feet close FAST (< 3 steps) with good balance.    Step over obstacles Moderate: Steps over box but touches box OR displays cautious behavior by slowing gait.    Timed UP & GO with Dual Task Normal: No noticeable change in sitting, standing or walking while backward counting when compared to TUG without    Mini-BEST total score 20      Self-Care   Self-Care Other Self-Care Comments    Other Self-Care Comments  Discussed progress towards goals, including improvements with therapy.  Discussed taht pt is still at fall risk per MiniBESTest and what situations require increased attention, control for improved safety and stability.  Discussed return screens (PT, OT, speech) in 6-8 months or situations that would warrant him speaking to MD about return to therapy before that time.  Discussed importance of continued performance of HEP.           REviewed updates to HEP given last visit, with pt return  demo understanding (see bold exercises).  Verbally reviewed remaining exercises, as pt return demo'd these last visit. Exercises Alternating Heel Raises - 1 x daily - 5 x weekly - 10 reps - 2 sets Seated Heel Toe Raises - 1 x daily - 7 x weekly - 20 reps Standing Hip Extension with Counter Support - 1 x daily - 5 x weekly - 2 sets - 10 reps - 3 sec hold Standing Balance with Eyes  Closed on Foam - 1 x daily - 5 x weekly - 3 sets - 20 hold Alternating Step Backward with Support - 1 x daily - 5 x weekly - 1-2 sets - 10 reps Side Stepping with Counter Support - 1 x daily - 5 x weekly - 1-2 sets - 10 reps Supine Bridge - 1 x daily - 5 x weekly - 2 sets - 5 reps   Added 06/20/2020:   Backward Walking with Counter Support - 1 x daily - 5 x weekly - 1 sets - 5 reps Staggered Stance Forward Backward Weight Shift with Counter Support - 1 x daily - 5 x weekly - 1-5 sets - 10 reps        PT Education - 06/22/20 1025    Education Details Progress towards goals, POC and plans for d/c and f/u screens    Person(s) Educated Patient    Methods Explanation    Comprehension Verbalized understanding            PT Short Term Goals - 05/22/20 1110      PT SHORT TERM GOAL #1   Title Pt will be independent with HEP to address balance, transfers, and gait.  TARGET 05/26/2020    Baseline pt reports independent with HEP    Time 4    Period Weeks    Status Achieved      PT SHORT TERM GOAL #2   Title Pt will improve TUG manual score to less than or equal to 14 sec for decreased fall risk.    Baseline 16.15 sec, 14.6 seconds with SPC on 05/22/20    Time 4    Period Weeks    Status Not Met      PT SHORT TERM GOAL #3   Title Pt will improve MiniBESTest score to at least 17/28 for decreased fall risk.    Baseline 14/28 at eval, 18/28 on 05/22/20    Time 4    Period Weeks    Status Achieved      PT SHORT TERM GOAL #4   Title Pt will verbalize understanding of fall prevention, tips to reduce  freezing/festination with turns and gait.    Time 4    Period Weeks    Status Achieved             PT Long Term Goals - 06/22/20 0935      PT LONG TERM GOAL #1   Title Pt will be independent with progression of HEP to address balance, gait.  TARGET 06/23/20 (incorrectly written as 05/24/20)    Time 8    Period Weeks    Status Achieved      PT LONG TERM GOAL #2   Title Pt will improve MiniBESTest score to at least 20/28 for decreased fall risk.    Baseline 20/28 06/22/2020    Time 8    Period Weeks    Status Achieved      PT LONG TERM GOAL #3   Title Pt will recover posterior LOB in 2 steps or less to improve balance recovery.    Time 8    Period Weeks    Status Achieved      PT LONG TERM GOAL #4   Title Pt will improve gait velocity to at least 2.62 ft/sec for improved gait efficiency in the community.    Baseline 2.4 ft/sec 06/22/2020    Time 8    Period Weeks    Status Not Met  Plan - 06/22/20 1201    Clinical Impression Statement Assessed LTGs this visit, with pt meeting LTG 1 for HEP, 2 for improved MiniBESTest score, and 3 for improved posterior push and release recovery.  Pt has demonstrated improvement in balance during the course of PT; however, he remains at fall risk per MiniBESTest score.  He demonstrates decreased plantarflexion strength in standing, which leads to abnormal gait pattern and delayed reactions for balance recovery at times.  LtG 4 not met for gait velocity, with gait velocity 2.4 ft/sec.  He performs HEP independently to address this and he is appropriate for d/c from PT at this time.    Personal Factors and Comorbidities Comorbidity 3+    Comorbidities GERD, OA,C-spine fusion, post-laminectory lumbar spine;  L THR 2019, L RTC repair 2020    Examination-Activity Limitations Locomotion Level;Transfers;Stand    Examination-Participation Restrictions Community Activity   Community fitness   Stability/Clinical Decision Making  Evolving/Moderate complexity    Rehab Potential Good    PT Frequency 2x / week    PT Duration 8 weeks   including eval week   PT Treatment/Interventions ADLs/Self Care Home Management;Neuromuscular re-education;Balance training;Therapeutic exercise;Therapeutic activities;Functional mobility training;Gait training;Stair training;Patient/family education;DME Instruction;Manual techniques    PT Next Visit Plan D/C this visit.  Plan for return screens in 6-8 months.    PT Home Exercise Plan [id number]    Consulted and Agree with Plan of Care Patient           Patient will benefit from skilled therapeutic intervention in order to improve the following deficits and impairments:  Abnormal gait,Difficulty walking,Decreased safety awareness,Decreased balance,Decreased mobility,Decreased strength,Postural dysfunction  Visit Diagnosis: Unsteadiness on feet  Other abnormalities of gait and mobility  Other symptoms and signs involving the nervous system     Problem List There are no problems to display for this patient.   Philander Ake W. 06/22/2020, 12:09 PM  Frazier Butt., PT  [facility] [address] [place], [place], [address] Phone: [phone]   Fax:  [phone]  Name: Jim Carson MRN: [id number] Date of Birth: 1950-10-24   PHYSICAL THERAPY DISCHARGE SUMMARY  Visits from Start of Care: 11  Current functional level related to goals / functional outcomes:  PT Long Term Goals - 06/22/20 0935      PT LONG TERM GOAL #1   Title Pt will be independent with progression of HEP to address balance, gait.  TARGET 06/23/20 (incorrectly written as 05/24/20)    Time 8    Period Weeks    Status Achieved      PT LONG TERM GOAL #2   Title Pt will improve MiniBESTest score to at least 20/28 for decreased fall risk.    Baseline 20/28 06/22/2020    Time 8    Period Weeks    Status Achieved      PT LONG TERM GOAL #3   Title  Pt will recover posterior LOB in 2 steps or less to improve balance recovery.    Time 8    Period Weeks    Status Achieved      PT LONG TERM GOAL #4   Title Pt will improve gait velocity to at least 2.62 ft/sec for improved gait efficiency in the community.    Baseline 2.4 ft/sec 06/22/2020    Time 8    Period Weeks    Status Not Met          Pt has met 3 of 4 LTGs.  Remaining deficits: Decreased balance, decreased plantarflexion strength   Education / Equipment: Educated in [employer] and fall prevention.  Plan: Patient agrees to discharge.  Patient goals were partially met. Patient is being discharged due to being pleased with the current functional level.  ?????Recommend return screens in 6-8 months due to progressive nature of disease process.    Mady Haagensen, PT 06/22/20 12:09 PM Phone: [phone] Fax: [phone]

## 2020-07-24 ENCOUNTER — Other Ambulatory Visit: Payer: Self-pay

## 2020-07-24 ENCOUNTER — Encounter (HOSPITAL_BASED_OUTPATIENT_CLINIC_OR_DEPARTMENT_OTHER): Payer: Self-pay | Admitting: *Deleted

## 2020-07-24 ENCOUNTER — Emergency Department (HOSPITAL_BASED_OUTPATIENT_CLINIC_OR_DEPARTMENT_OTHER): Payer: Medicare Other

## 2020-07-24 ENCOUNTER — Emergency Department (HOSPITAL_BASED_OUTPATIENT_CLINIC_OR_DEPARTMENT_OTHER)
Admission: EM | Admit: 2020-07-24 | Discharge: 2020-07-24 | Disposition: A | Discharge status: Home / Self Care | Payer: Medicare Other | Attending: Emergency Medicine | Admitting: Emergency Medicine

## 2020-07-24 DIAGNOSIS — G20 Parkinson's disease: Secondary | ICD-10-CM | POA: Insufficient documentation

## 2020-07-24 DIAGNOSIS — Z7982 Long term (current) use of aspirin: Secondary | ICD-10-CM | POA: Diagnosis not present

## 2020-07-24 DIAGNOSIS — E039 Hypothyroidism, unspecified: Secondary | ICD-10-CM | POA: Insufficient documentation

## 2020-07-24 DIAGNOSIS — W010XXA Fall on same level from slipping, tripping and stumbling without subsequent striking against object, initial encounter: Secondary | ICD-10-CM | POA: Insufficient documentation

## 2020-07-24 DIAGNOSIS — I10 Essential (primary) hypertension: Secondary | ICD-10-CM | POA: Insufficient documentation

## 2020-07-24 DIAGNOSIS — W19XXXA Unspecified fall, initial encounter: Secondary | ICD-10-CM

## 2020-07-24 DIAGNOSIS — Z96642 Presence of left artificial hip joint: Secondary | ICD-10-CM | POA: Insufficient documentation

## 2020-07-24 DIAGNOSIS — Z79899 Other long term (current) drug therapy: Secondary | ICD-10-CM | POA: Insufficient documentation

## 2020-07-24 DIAGNOSIS — Y92094 Garage of other non-institutional residence as the place of occurrence of the external cause: Secondary | ICD-10-CM | POA: Insufficient documentation

## 2020-07-24 DIAGNOSIS — Y9389 Activity, other specified: Secondary | ICD-10-CM | POA: Insufficient documentation

## 2020-07-24 DIAGNOSIS — Z23 Encounter for immunization: Secondary | ICD-10-CM | POA: Insufficient documentation

## 2020-07-24 DIAGNOSIS — S0181XA Laceration without foreign body of other part of head, initial encounter: Secondary | ICD-10-CM

## 2020-07-24 MED ORDER — LIDOCAINE HCL (PF) 1 % IJ SOLN
10.0000 mL | Freq: Once | INTRAMUSCULAR | Status: AC
  Administered 2020-07-24: 10 mL
  Filled 2020-07-24: qty 10

## 2020-07-24 MED ORDER — TETANUS-DIPHTH-ACELL PERTUSSIS 5-2.5-18.5 LF-MCG/0.5 IM SUSY
0.5000 mL | PREFILLED_SYRINGE | Freq: Once | INTRAMUSCULAR | Status: AC
  Administered 2020-07-24: 0.5 mL via INTRAMUSCULAR
  Filled 2020-07-24: qty 0.5

## 2020-07-24 NOTE — ED Notes (Signed)
Laceration to right orbit with skin tear to the right of left eye

## 2020-07-24 NOTE — ED Provider Notes (Signed)
[name] Provider Note   CSN: [id number] Arrival date & time: 07/24/20  1258     History Chief Complaint  Patient presents with  . Fall  . Laceration    Jim Carson is a 70 y.o. male with a hx of GERD, OSA on CPAP, parkinsons disease, & hypothyroidism who presents to the ED S/p mechanical fall just PTA with facial lacerations. Patient states that he tripped over something in the garage and fell hitting his right eyebrow and sustaining a laceration. The area is sore, but he is not in significant pain. No alleviating/aggravating factors. Denies LOC or anticoagulation use & was able to get up without assistance. Denies numbness, weakness, visual disturbance, neck pain, back pain, chest pain, abdominal pain, or hip pain.   HPI     Past Medical History:  Diagnosis Date  . Abnormal gait    due to parkinson's,  uses cane  . Allergic rhinitis   . Benign essential hypertension   . Chronic constipation   . Deviated septum   . GERD (gastroesophageal reflux disease)   . History of multiple concussions    per pt as teen playing football, no residual  . Hypothyroidism    followed by pcp  . Left shoulder pain   . OA (osteoarthritis)   . OSA on CPAP   . Parkinson disease [facility])    neurologist-- dr b. scott  @[facility]  . Vitamin B12 deficiency   . Vitamin D deficiency   . Wears glasses     There are no problems to display for this patient.   Past Surgical History:  Procedure Laterality Date  . APPENDECTOMY  07/1964  . LAPAROSCOPIC CHOLECYSTECTOMY  01-21-2017   @[facility] , [place]  . POSTERIOR FUSION CERVICAL SPINE  08-15-2011   @[facility] , [place]   w/ laminectomy and decompression-- C3 -- T1  . POSTERIOR LAMINECTOMY / DECOMPRESSION LUMBAR SPINE  07-30-2013   @[facility] , [place]   bilateral T11 - 12,  L2 --5  . 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 SHOULDER ARTHROSCOPY WITH ROTATOR CUFF REPAIR Left  12/15/2018   Procedure: SHOULDER ARTHROSCOPY, biceps tenodesis labored Debridement, submacromial decompression;  Surgeon: Sydnee Cabal, MD;  Location: [facility];  Service: Orthopedics;  Laterality: Left;  interscalene block  . TOTAL HIP ARTHROPLASTY Left 09/18/2017   @[facility] , [place]       No family history on file.  Social History   Tobacco Use  . Smoking status: Never Smoker  . Smokeless tobacco: Never Used  Vaping Use  . Vaping Use: Never used  Substance Use Topics  . Alcohol use: Yes    Comment: one daily wine  . Drug use: Never    Home Medications Prior to Admission medications   Medication Sig Start Date End Date Taking? Authorizing Provider  aspirin EC 81 MG tablet Take 81 mg by mouth at bedtime.    [provider]  carbidopa-levodopa (SINEMET IR) 25-100 MG tablet Take 2 tablets by mouth 3 (three) times daily.     [provider]  Carbidopa-Levodopa ER (SINEMET CR) 25-100 MG tablet controlled release Take 1 tablet by mouth at bedtime.     [provider]  Cholecalciferol (VITAMIN D3) 125 MCG (5000 UT) CAPS Take 1 capsule by mouth daily.    [provider]  diclofenac sodium (VOLTAREN) 1 % GEL Apply topically 4 (four) times daily.    [provider]  docusate sodium (COLACE) 100 MG capsule Take 100 mg by mouth daily.    [provider]  Glucosamine-Chondroitin-Ca-D3 (TRIPLE FLEX 50+ PO) Take by mouth 2 (two) times daily.    [provider]  ipratropium (ATROVENT) 0.06 % nasal spray Place 2 sprays into both nostrils daily as needed for rhinitis.    [provider]  levothyroxine (SYNTHROID, LEVOTHROID) 75 MCG tablet Take 75 mcg by mouth daily before breakfast.    [provider]  losartan-hydrochlorothiazide (HYZAAR) 100-25 MG tablet Take 1 tablet by mouth daily.     [provider]  omeprazole (PRILOSEC) 20 MG capsule Take 20 mg by mouth daily.     [provider]  pramipexole (MIRAPEX) 1 MG tablet Take 1 mg by mouth 2 (two) times daily.     [provider]  selegiline (ELDEPRYL) 5 MG tablet Take 5 mg by mouth 2 (two) times daily with a meal.    [provider]  vitamin B-12 (CYANOCOBALAMIN) 1000 MCG tablet Take 1,000 mcg by mouth daily.    [provider]    Allergies    Patient has no known allergies.  Review of Systems   Review of Systems  Constitutional: Negative for chills and fever.  Eyes: Negative for visual disturbance.  Respiratory: Negative for shortness of breath.   Cardiovascular: Negative for chest pain.  Gastrointestinal: Negative for abdominal pain and vomiting.  Musculoskeletal: Negative for back pain and neck pain.  Skin: Positive for wound (mild pain to this area).  Neurological: Negative for dizziness, seizures, syncope, speech difficulty, weakness and numbness.  All other systems reviewed and are negative.   Physical Exam Updated Vital Signs BP 131/62   Pulse 63   Temp 97.8 F (36.6 C) (Oral)   Resp 18   Ht 5\' 8"  (1.727 m)   Wt 98.9 kg   SpO2 99%   BMI 33.15 kg/m   Physical Exam Vitals and nursing note reviewed.  Constitutional:      General: He is not in acute distress.    Appearance: He is not ill-appearing.  HENT:     Head:     Comments: 2.75 cm laceration to the R eyebrow, 2 mm deep, no fascia involvement, no appreciable FB. Skin tear to the R upper cheek some of the skin is completely abraded, more medial aspect with 1 cm laceration that is 2 mm deep- no active bleeding or appreciable FB. Small area of bruising to the inferior orbital area of the R eye which is tender to palpation as well as to the right zygomatic arch, mild swelling noted as well. No battle sign.     Ears:     Comments: No hemotympanum.  Eyes:     Extraocular Movements: Extraocular movements intact.     Pupils: Pupils are equal, round, and reactive to light.  Neck:     Comments: No midline tenderness to  palpation.  Cardiovascular:     Rate and Rhythm: Normal rate and regular rhythm.  Pulmonary:     Effort: Pulmonary effort is normal. No respiratory distress.     Breath sounds: Normal breath sounds. No wheezing or rales.  Chest:     Chest wall: No tenderness.  Abdominal:     General: There is no distension.     Palpations: Abdomen is soft.     Tenderness: There is no abdominal tenderness. There is no guarding or rebound.  Musculoskeletal:     Cervical back: Normal range of motion and  neck supple.     Comments: No obvious deformities or significant open wounds:  UE/LEs: Able to actively range at all major joints to upper/lower bilateral extremities. Able to lift the legs off of the bed without difficulty. No focal areas of tenderness to palpation x 4.  Back: No midline tenderness or palpable step off.   Neurological:     Mental Status: He is alert.     Comments: Alert. Clear speech. Sensation grossly intact x 4. 5/5 symmetric grip strength & plantar/dorsiflexion bilaterally. Ambulatory.   Psychiatric:        Mood and Affect: Mood normal.        Behavior: Behavior normal.    ED Results / Procedures / Treatments   Labs (all labs ordered are listed, but only abnormal results are displayed) Labs Reviewed - No data to display  EKG None  Radiology CT Head Wo Contrast  Result Date: 07/24/2020 CLINICAL DATA:  Right supraorbital laceration, tripped and fell EXAM: CT HEAD WITHOUT CONTRAST TECHNIQUE: Contiguous axial images were obtained from the base of the skull through the vertex without intravenous contrast. COMPARISON:  None. FINDINGS: Brain: No acute infarct or hemorrhage. Lateral ventricles and midline structures are unremarkable. No acute extra-axial fluid collections. Likely arachnoid cyst within the right aspect posterior fossa measuring 3.5 x 2.9 x 4.4 cm. No mass effect. Vascular: No hyperdense vessel or unexpected calcification. Skull: Normal. Negative for fracture or focal  lesion. Sinuses/Orbits: Minimal polypoid mucosal thickening left maxillary sinus. Remainder of the sinuses are clear. Other: None. IMPRESSION: 1. No acute intracranial process. 2. Right posterior fossa arachnoid cyst, no mass effect. Electronically Signed   By: Randa Ngo M.D.   On: 07/24/2020 15:30   CT Maxillofacial Wo Contrast  Result Date: 07/24/2020 CLINICAL DATA:  Tripped and fell, right supraorbital laceration EXAM: CT MAXILLOFACIAL WITHOUT CONTRAST TECHNIQUE: Multidetector CT imaging of the maxillofacial structures was performed. Multiplanar CT image reconstructions were also generated. COMPARISON:  None. FINDINGS: Osseous: No fracture or mandibular dislocation. No destructive process. Orbits: Negative. No traumatic or inflammatory finding. Sinuses: Polypoid mucosal thickening within the bilateral maxillary sinuses. Remaining sinuses are clear. Soft tissues: There is minimal right periorbital soft tissue swelling. Remaining soft tissues are unremarkable. Limited intracranial: No significant or unexpected finding. IMPRESSION: 1. No acute facial bone fracture. 2. Minimal right periorbital soft tissue swelling. 3. Polypoid mucosal thickening bilateral maxillary sinuses. Electronically Signed   By: Randa Ngo M.D.   On: 07/24/2020 15:34    Procedures .Marland KitchenLaceration Repair  Date/Time: 07/24/2020 4:02 PM Performed by: Amaryllis Dyke, PA-C Authorized by: Amaryllis Dyke, PA-C   Consent:    Consent obtained:  Verbal   Consent given by:  Patient   Risks, benefits, and alternatives were discussed: yes     Risks discussed:  Infection, need for additional repair, nerve damage, poor wound healing, pain, poor cosmetic result, vascular damage, tendon damage and retained foreign body   Alternatives discussed:  No treatment Anesthesia:    Anesthesia method:  Local infiltration   Local anesthetic:  Lidocaine 1% w/o epi Laceration details:    Location:  Face   Face location:  R  eyebrow   Length (cm):  2.8   Depth (mm):  2 Pre-procedure details:    Preparation:  Patient was prepped and draped in usual sterile fashion Exploration:    Hemostasis achieved with:  Direct pressure   Contaminated: no   Treatment:    Area cleansed with:  Povidone-iodine   Amount of cleaning:  Standard   Irrigation solution:  Sterile water   Irrigation method:  Pressure wash Skin repair:    Repair method:  Sutures   Suture size:  5-0   Wound skin closure material used: vicryle rapide.   Suture technique:  Simple interrupted   Number of sutures:  4 Approximation:    Approximation:  Close Repair type:    Repair type:  Simple Post-procedure details:    Procedure completion:  Tolerated well, no immediate complications  .Marland KitchenLaceration Repair  Date/Time: 07/24/2020 4:04 PM Performed by: Amaryllis Dyke, PA-C Authorized by: Amaryllis Dyke, PA-C   Consent:    Consent obtained:  Verbal   Consent given by:  Patient   Risks, benefits, and alternatives were discussed: yes     Risks discussed:  Need for additional repair, nerve damage, infection, pain, poor cosmetic result, poor wound healing, vascular damage, tendon damage and retained foreign body   Alternatives discussed:  No treatment Anesthesia:    Anesthesia method:  Local infiltration   Local anesthetic:  Lidocaine 1% w/o epi Laceration details:    Location:  Face   Face location:  L cheek   Length (cm):  1   Depth (mm):  2 Pre-procedure details:    Preparation:  Patient was prepped and draped in usual sterile fashion Exploration:    Hemostasis achieved with:  Direct pressure   Contaminated: no   Treatment:    Area cleansed with:  Povidone-iodine   Amount of cleaning:  Standard   Irrigation solution:  Sterile water   Irrigation method:  Pressure wash Skin repair:    Repair method:  Sutures   Suture size:  5-0   Wound skin closure material used: vicryle rapide.   Suture technique:  Simple interrupted    Number of sutures:  2 Approximation:    Approximation:  Close Repair type:    Repair type:  Simple Post-procedure details:    Procedure completion:  Tolerated well, no immediate complications   Medications Ordered in ED Medications  lidocaine (PF) (XYLOCAINE) 1 % injection 10 mL (10 mLs Infiltration Given by Other 07/24/20 1524)  Tdap (BOOSTRIX) injection 0.5 mL (0.5 mLs Intramuscular Given 07/24/20 1525)    ED Course  I have reviewed the triage vital signs and the nursing notes.  Pertinent labs & imaging results that were available during my care of the patient were reviewed by me and considered in my medical decision making (see chart for details).    MDM Rules/Calculators/A&P                          Patient presents to the emergency department status post mechanical fall with facial injury.  He is nontoxic, resting comfortably, vitals without significant abnormality.  A CT head and maxillofacial was obtained which did not show any intracranial bleeding or facial fracture-I personally reviewed and interpreted imaging and I am in agreement with radiologist read..  Patient has no midline spinal tenderness or neurologic deficits to raise concern for spinal fracture.  He has no chest or abdominal tenderness.  He is moving all extremities without focal bony tenderness and is ambulatory.  His wounds were pressure ear wash, visualized in a bloodless field, no evidence of foreign body.  Repaired per procedure note above.  Tetanus was updated as last tetanus was in 2014.  Will discharge home with wound care instructions. I discussed results, treatment plan, need for follow-up, and return precautions with the patient & his  wife @ bedside. Provided opportunity for questions, patient & his wife confirmed understanding and are in agreement with plan.   Final Clinical Impression(s) / ED Diagnoses Final diagnoses:  Fall, initial encounter  Facial laceration, initial encounter    Rx / DC Orders ED  Discharge Orders    None       Amaryllis Dyke, PA-C 07/24/20 1611    Little, Wenda Overland, MD 07/24/20 1932

## 2020-07-24 NOTE — ED Triage Notes (Signed)
He tripped over the garage threshold and fell. Laceration to his right eyebrow and the right side of his face. No LOC.

## 2020-07-24 NOTE — ED Notes (Signed)
Patient denies pain and is resting comfortably.  

## 2020-07-24 NOTE — Discharge Instructions (Addendum)
You were seen in the emergency department today after a fall.  Your CT scan did not show a brain bleed or any fractures in your face.  Your CT scan did show what is called an arachnoid cyst, these can form for a variety of reasons, these often do not require treatment unless they were very large or causing significant symptoms, please discuss with your primary care provider.  We closed your wound with absorbable stitches.  Stitches will come out on their own.  In 5 to 7 days you may apply antibiotic ointment to this area and pull the stitches out on your own.  For the first 24 hours keep the wound clean dry and intact.  After 24 hours you can get it wet.  Be sure to keep it covered in the sun to help avoid scarring.  Follow with your primary care provider within 1 week for reevaluation.  Return to the ER for new or worsening symptoms including but not limited to severe headaches, vomiting, seizure activity, new areas of pain, blood in your stool or urine, trouble breathing, passing out, redness/drainage to your wounds, fever, or any other concerns.

## 2020-12-22 ENCOUNTER — Encounter: Payer: Self-pay | Admitting: Occupational Therapy

## 2020-12-22 ENCOUNTER — Ambulatory Visit: Payer: Medicare Other

## 2020-12-22 ENCOUNTER — Ambulatory Visit: Payer: Medicare Other | Attending: Family Medicine | Admitting: Physical Therapy

## 2020-12-22 ENCOUNTER — Encounter: Payer: Self-pay | Admitting: Physical Therapy

## 2020-12-22 ENCOUNTER — Ambulatory Visit: Payer: Medicare Other | Admitting: Occupational Therapy

## 2020-12-22 ENCOUNTER — Other Ambulatory Visit: Payer: Self-pay

## 2020-12-22 DIAGNOSIS — R29818 Other symptoms and signs involving the nervous system: Secondary | ICD-10-CM | POA: Insufficient documentation

## 2020-12-22 DIAGNOSIS — R2689 Other abnormalities of gait and mobility: Secondary | ICD-10-CM | POA: Insufficient documentation

## 2020-12-22 DIAGNOSIS — M25621 Stiffness of right elbow, not elsewhere classified: Secondary | ICD-10-CM

## 2020-12-22 DIAGNOSIS — R1312 Dysphagia, oropharyngeal phase: Secondary | ICD-10-CM

## 2020-12-22 DIAGNOSIS — R293 Abnormal posture: Secondary | ICD-10-CM

## 2020-12-22 DIAGNOSIS — R471 Dysarthria and anarthria: Secondary | ICD-10-CM | POA: Insufficient documentation

## 2020-12-22 DIAGNOSIS — M6281 Muscle weakness (generalized): Secondary | ICD-10-CM | POA: Insufficient documentation

## 2020-12-22 DIAGNOSIS — R278 Other lack of coordination: Secondary | ICD-10-CM | POA: Diagnosis present

## 2020-12-22 DIAGNOSIS — R2681 Unsteadiness on feet: Secondary | ICD-10-CM | POA: Diagnosis present

## 2020-12-22 NOTE — Therapy (Signed)
[facility] [address] [place], [place], [address] Phone: [phone]   Fax:  [phone]  Occupational Therapy Evaluation  Patient Details  Name: Jim Carson MRN: [id number] Date of Birth: December 03, 1950 Referring Provider (OT): Dr.Moore, Cordie Grice PA-c   Encounter Date: 12/22/2020   OT End of Session - 12/22/20 1054     Visit Number 1    Number of Visits 17    Date for OT Re-Evaluation 06/12/20    Authorization Type Medicare/ AA/RP    OT Start Time 1020    OT Stop Time 1100    OT Time Calculation (min) 40 min             Past Medical History:  Diagnosis Date   Abnormal gait    due to parkinson's,  uses cane   Allergic rhinitis    Benign essential hypertension    Chronic constipation    Deviated septum    GERD (gastroesophageal reflux disease)    History of multiple concussions    per pt as teen playing football, no residual   Hypothyroidism    followed by pcp   Left shoulder pain    OA (osteoarthritis)    OSA on CPAP    Parkinson disease [facility])    neurologist-- dr b. scott  @[facility]   Vitamin B12 deficiency    Vitamin D deficiency    Wears glasses     Past Surgical History:  Procedure Laterality Date   APPENDECTOMY  07/1964   LAPAROSCOPIC CHOLECYSTECTOMY  01-21-2017   @[facility] , [place]   POSTERIOR FUSION CERVICAL SPINE  08-15-2011   @[facility] , [place]   w/ laminectomy and decompression-- C3 -- T1   POSTERIOR LAMINECTOMY / DECOMPRESSION LUMBAR SPINE  07-30-2013   @[facility] , [place]   bilateral T11 - 12,  L2 --5   REMOVAL LEFT T1 PARASPINOUS MASS  02-12-2016   @[facility] ,  [place]   desmoid fibromatosis   SHOULDER ARTHROSCOPY WITH ROTATOR CUFF REPAIR Left 12/15/2018   Procedure: SHOULDER ARTHROSCOPY, biceps tenodesis labored Debridement, submacromial decompression;  Surgeon: Sydnee Cabal, MD;  Location: [facility];  Service: Orthopedics;  Laterality: Left;  interscalene block    TOTAL HIP 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   Subjective Assessment - 12/22/20 1020     Subjective  Denies pain    Pertinent History Parkinson's Disease, hypothyroidism, hx of multiple concussions, GERD, benign essential HTN, abnormal gait, L THA 2019, removal of T1 paraspinous mass 2017, T11-12 and L2-5 lumbar lami/decompression 2015, cervical fusion C3-T1 2013, hx of shoulder surgery June 2020    Patient Stated Goals play guitar better    Currently in Pain? No/denies               [facility] OT Assessment - 12/22/20 1023       Assessment   Medical Diagnosis Parkinson's disease    Referring Provider (OT) Dr.Moore, Cordie Grice PA-c    Onset Date/Surgical Date 12/21/20    Hand Dominance Left    Prior Therapy October-December 2021      Precautions   Precautions Fall      Balance Screen   Has the patient fallen in the past 6 months Yes    How many times? 1    Has the patient had a decrease in activity level because of a fear of falling?  No    Is the patient reluctant to leave their home  because of a fear of falling?  No      Home  Environment   Family/patient expects to be discharged to: Private residence    Lives With Spouse      Prior Function   Level of Independence Independent    Leisure playing guitar, goes to rock steady boxing, goes to the [employer] 2-3 x /wk      ADL   Eating/Feeding Modified independent    Grooming Modified independent    Upper Body Bathing Modified independent   increased time required   Lower Body Bathing Modified independent    Upper Body Dressing Needs assist for fasteners   modified I with dressing except for fasteners   Lower Body Dressing Increased time   modified independently   Toilet Transfer Modified independent    Tub/Shower Transfer Modified independent   walk in shower     Written Expression   Dominant Hand Left    Handwriting 100% legible;Mild micrographia       Vision - History   Baseline Vision Wears glasses all the time      Cognition   Overall Cognitive Status Within Functional Limits for tasks assessed      Observation/Other Assessments   Other Surveys  Select    Physical Performance Test   Yes    Simulated Eating Time (seconds) 11.53 secs, 1 drop    Donning Doffing Jacket Time (seconds) 11.12secs    Donning Doffing Jacket Comments 3 button/ unbutton 36.40 secs      Sensation   Light Touch Impaired by gross assessment   RUE s/p neck surgery     Coordination   9 Hole Peg Test Right;Left    Right 9 Hole Peg Test 79 secs    Left 9 Hole Peg Test 35 secs   several drops   Box and Blocks R 51, L 56      ROM / Strength   AROM / PROM / Strength AROM      AROM   Overall AROM  Deficits    Overall AROM Comments RUE shoulder flexion 120, elbow extension -25, LUE  shoulder flexion 135, elbow extension -20      Hand Function   Right Hand Grip (lbs) 48.7    Left Hand Grip (lbs) 67.9                             OT Education - 12/22/20 1500     Education Details Issued P[employer]! moves seated handout and encourgaed pt to perfrom at home, reinforce importance of coordination HEP    Person(s) Educated Patient    Methods Explanation;Handout    Comprehension Verbalized understanding                        Plan - 12/22/20 1452     Clinical Impression Statement Pt with the following diagnoses: Parkinson's Disease, and PMH: hypothyroidism, hx of multiple concussions, GERD, benign essential HTN, abnormal gait, L THA 2019, removal of T1 paraspinous mass 2017, T11-12 and L2-5 lumbar lami/decompression 2015, cervical fusion C3-T1 2013, hx of shoulder surgery June 2020 was seen for return OT evalaution today. Pt presents with balance deficits, decreased coordination, decreased awareness, decreased ROM and bradykinesia which impedes ADLS/IADLs. Pt would benefit from a short course of OT due to his deficits, however he has  elected not to proceed at this time reporting concerns over gas prices.    OT Occupational  Profile and History Problem Focused Assessment - Including review of records relating to presenting problem    Occupational performance deficits (Please refer to evaluation for details): ADL's;IADL's;Work;Play;Leisure;Social Participation    Body Structure / Function / Physical Skills ADL;Balance;Mobility;Strength;Flexibility;UE functional use;FMC;Coordination;Decreased knowledge of precautions;GMC;ROM;Sensation;IADL;Dexterity    Cognitive Skills Memory    Rehab Potential Good    Clinical Decision Making Limited treatment options, no task modification necessary    Comorbidities Affecting Occupational Performance: May have comorbidities impacting occupational performance    Modification or Assistance to Complete Evaluation  No modification of tasks or assist necessary to complete eval    OT Frequency One time visit   anticipate d/c after 6 weeks   OT Duration 12 weeks    OT Treatment/Interventions Self-care/ADL training;Patient/family education;Fluidtherapy    Plan schedule PD screen in 6 mons, no goals set as pt elected not to proceed reporting concerns over gas prices.    Consulted and Agree with Plan of Care Patient             Patient will benefit from skilled therapeutic intervention in order to improve the following deficits and impairments:   Body Structure / Function / Physical Skills: ADL, Balance, Mobility, Strength, Flexibility, UE functional use, FMC, Coordination, Decreased knowledge of precautions, GMC, ROM, Sensation, IADL, Dexterity Cognitive Skills: Memory     Visit Diagnosis: Other symptoms and signs involving the nervous system  Muscle weakness (generalized)  Other lack of coordination  Stiffness of right elbow, not elsewhere classified  Abnormal posture  Unsteadiness on feet  Other abnormalities of gait and mobility    Problem List There are no problems to display  for this patient.   Edgel Degnan 12/22/2020, 3:01 PM Theone Murdoch, OTR/L [phone] Phone: [phone] 3:02 PM 12/22/20  [facility] [address] [place], [place], [address] Phone: [phone]   Fax:  [phone]  Name: Herron Fero MRN: [id number] Date of Birth: 1950/12/13

## 2020-12-22 NOTE — Therapy (Signed)
[place] [facility] [address] [place], [place], [address] Phone: [phone]   Fax:  [phone]  Physical Therapy Evaluation  Patient Details  Name: Jim Carson MRN: [id number] Date of Birth: 1950-07-12 Referring Provider (PT): Modena Nunnery, PA-C   Encounter Date: 12/22/2020   PT End of Session - 12/22/20 1344     Visit Number 1    Number of Visits 1    Authorization Type Medicare/AARP    PT Start Time 0933    PT Stop Time 1015    PT Time Calculation (min) 42 min    Activity Tolerance Patient tolerated treatment well    Behavior During Therapy WFL for tasks assessed/performed             Past Medical History:  Diagnosis Date   Abnormal gait    due to parkinson's,  uses cane   Allergic rhinitis    Benign essential hypertension    Chronic constipation    Deviated septum    GERD (gastroesophageal reflux disease)    History of multiple concussions    per pt as teen playing football, no residual   Hypothyroidism    followed by pcp   Left shoulder pain    OA (osteoarthritis)    OSA on CPAP    Parkinson disease [facility])    neurologist-- dr b. 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    Subjective Assessment - 12/22/20 0941     Subjective Pt concurs he is here for 6 month return PT eval.  He had one significant fall in late January, tripped going into the house, and fell flat on my face.  Had to have stitches.  Went for PT at [facility] in the interim at the advice of his wife.  They recommended rollator and AFO, which pt doesn't use.    Patient Stated Goals No specific goals at this time.    Currently in Pain? No/denies                [facility] PT Assessment - 12/22/20 0945       Assessment   Medical Diagnosis Parkinson's disease    Referring Provider (PT) Modena Nunnery, PA-C    Onset Date/Surgical Date 12/21/20   order from PA   Hand Dominance Left    Prior Therapy October-December 2021      Precautions   Precautions Fall      Balance Screen   How many times? 1    Has the patient had a  decrease in activity level because of a fear of falling?  No    Is the patient reluctant to leave their home because of a fear of falling?  No      Home Environment   Living Environment Private residence    Living Arrangements Spouse/significant other    Available Help at Discharge Family    Type of [place] Access Level entry    [place] Two level;Able to live on main level with bedroom/bathroom    Alternate Level Stairs-Number of Steps 12    Alternate Level Stairs-Rails Right;Left    Home Equipment Walker - 2 wheels;Kasandra Knudsen - single point      Prior Function   Level of Independence Independent    Leisure playing guitar, goes to rock steady boxing, goes to the [employer] 2-3 x /wk      Observation/Other Assessments   Focus on Therapeutic Outcomes (FOTO)  NA      ROM / Strength   AROM / PROM / Strength --      Transfers   Transfers Sit to Stand;Stand to Sit    Sit to Stand 5:  Supervision;From chair/3-in-1;Without upper extremity assist    Five time sit to stand comments  11.29    Stand to Sit 5: Supervision;Without upper extremity assist;To chair/3-in-1      Ambulation/Gait   Ambulation/Gait Yes    Ambulation/Gait Assistance 5: Supervision    Ambulation Distance (Feet) 150 Feet    Assistive device Straight cane    Gait Pattern Step-through pattern;Decreased arm swing - left;Decreased step length - right;Decreased step length - left;Shuffle;Trunk flexed;Narrow base of support;Scissoring;Festinating;Right genu recurvatum    Ambulation Surface Level;Indoor    Gait velocity 16.34 sec = 2.14 ft/sec      Standardized Balance Assessment   Standardized Balance Assessment Timed Up and Go Test;Mini-BESTest      Mini-BESTest   Sit To Stand Normal: Comes to stand without use of hands and stabilizes independently.    Rise to Toes < 3 s.    Stand on one leg (left) Moderate: < 20 s   5.28, 2.75   Stand on one leg (right) Moderate: < 20 s   2.7, 1 sec   Stand on one leg - lowest score 1    Compensatory Stepping Correction - Forward Moderate: More than one step is required to recover equilibrium    Compensatory Stepping Correction - Backward Moderate: More than one step is required to recover equilibrium    Compensatory Stepping Correction - Left Lateral Moderate: Several steps to recover equilibrium    Compensatory Stepping Correction - Right Lateral Moderate: Several steps to recover equilibrium    Stepping Corredtion Lateral - lowest score 1    Stance - Feet together, eyes open, firm surface  Normal: 30s    Stance - Feet together, eyes closed, foam surface  Moderate: < 30s    Incline - Eyes Closed Normal: Stands independently 30s and aligns with gravity    Change in Gait Speed Normal: Significantly changes walkling speed without imbalance    Walk with head turns - Horizontal Normal: performs head turns with no change in gait speed and good balance    Walk with pivot  turns Normal: Turns with feet close FAST (< 3 steps) with good balance.    Step over obstacles Moderate: Steps over box but touches box OR displays cautious behavior by slowing gait.    Timed UP & GO with Dual Task Normal: No  noticeable change in sitting, standing or walking while backward counting when compared to TUG without    Mini-BEST total score 20      Timed Up and Go Test   Normal TUG (seconds) 16.25    Manual TUG (seconds) 15.07    Cognitive TUG (seconds) 16.31   mild LOB with turn   TUG Comments with SPC                        Objective measurements completed on examination: See above findings.               PT Education - 12/22/20 1342     Education Details Eval results; likely not going to pick up due to similar results as last PT d/c from this clinic 12/21; pt has been seen at another clinic and recently d/c.  Encouraged him to conitnue to work on balance exercise as part of Previously given HEP    Person(s) Educated Patient    Methods Explanation    Comprehension Verbalized understanding              PT Short Term Goals - 12/22/20 1349       PT SHORT TERM GOAL #1   Title ---               PT Long Term Goals - 12/22/20 1349       PT LONG TERM GOAL #1   Title ---                    Plan - 12/22/20 1344     Clinical Impression Statement Pt is a 70 year old male who presents to OPPT with hx of Parkinson's disease, who returns for 6 month PT eval, as recommended at previous d/c in 05/2020.  Pt reports having one fall in January, and at the encouragement of wife, participated in PT at another clinic (discharged in May 2022).  They made recommendations for use of rollator and AFO for RLE for improved stability; pt presents to OPPT eval today with cane and no AFO, reporting he does not typically use.  His eval measures are similar to d/c, with MiniBESTest score and 5x sit<>stand test near same measure.  Encouraged pt to  focus on previously given HEP for balance.  Pt remains at fall risk and would benefit from follow up/return for PD screens in 6 months (or earlier if functional status warrants)    Personal Factors and Comorbidities Comorbidity 3+    Comorbidities GERD, OA,C-spine fusion, post-laminectory lumbar spine;  L THR 2019, L RTC repair 2020    Stability/Clinical Decision Making Stable/Uncomplicated    Clinical Decision Making Low    Rehab Potential Good    PT Frequency One time visit    PT Next Visit Plan Return screen in 6 months    Consulted and Agree with Plan of Care Patient             Patient will benefit from skilled therapeutic intervention in order to improve the following deficits and impairments:     Visit Diagnosis: Other abnormalities of gait and mobility     Problem List There are no problems to display for this patient.   Ameilia Rattan W. 12/22/2020, 1:50 PM Mady Haagensen, PT 12/22/20 1:50 PM Phone: [phone] Fax: [facility] [address] [place], [place], [address] Phone: [phone]   Fax:  [phone]  Name: Temesgen  Chrystal MRN: [id number] Date of Birth: [birth year]

## 2020-12-22 NOTE — Therapy (Signed)
[facility] [address] [place], [place], [address] Phone: [phone]   Fax:  [phone]  Patient Details  Name: Jim Carson MRN: [id number] Date of Birth: Jul 18, 1950 Referring Provider:  Gillermo Murdoch, PA Encounter Date: 12/22/2020   Speech Therapy Parkinson's Disease Screen  SLP learned in pt interview prior to his scheduled ST evaluation that he did not want to participate, at this time, in therapies due to increased gasoline prices and thus incr'd cost of transportation to and from therapies, so SLP modified today's visit to as speech/language and swallowing screening.   Decibel Level today: average upper 60s dB  (WNL=70-72 dB) in 10 minutes of conversation with sound level meter 30cm away from pt's masked mouth. Pt's conversational volume has decreased since last treatment course in spring 2021. When SLP brought this to pt's attention he incr'd his volume to low 70's for 3 minutes of simple to mod complex conversation. He stated, "It seems like I'm shouting," however SLP assured him he was not shouting and referenced sound level meter readings. [name] told me he was performing loud /a/ once a day - SLP encouraged pt to perform BID, and SLP also provided pt some handouts requiring 1-2 sentence responses in order to practice louder, WNL speech volume.  Pt does not report difficulty in swallowing warranting further evaluation - he is completing hard swallow x25 multiple times per week. SLP encouraged pt to perform at least 20 effortful swallows BID. Pt stated he could do all exercises (/a/ and effortful swallows) while driving each day - SLP affirmed pt's realization.  Pt could likely benefit from some skilled ST at this point but would like to defer at this time. It is recommended pt complete ST screen in another 6 months.   [facility] ,[place], [place]  12/22/2020, 11:27 AM  [facility] [address] [place], [place], [address] Phone: [phone]   Fax:  [phone]

## 2021-01-15 ENCOUNTER — Other Ambulatory Visit: Payer: Self-pay

## 2021-01-15 ENCOUNTER — Encounter: Payer: Self-pay | Admitting: Physical Therapy

## 2021-01-15 ENCOUNTER — Ambulatory Visit: Payer: Medicare Other | Attending: Orthopedic Surgery | Admitting: Physical Therapy

## 2021-01-15 DIAGNOSIS — R2689 Other abnormalities of gait and mobility: Secondary | ICD-10-CM

## 2021-01-15 DIAGNOSIS — M6281 Muscle weakness (generalized): Secondary | ICD-10-CM | POA: Diagnosis present

## 2021-01-15 DIAGNOSIS — M25652 Stiffness of left hip, not elsewhere classified: Secondary | ICD-10-CM | POA: Diagnosis present

## 2021-01-15 DIAGNOSIS — M25552 Pain in left hip: Secondary | ICD-10-CM | POA: Diagnosis present

## 2021-01-15 NOTE — Therapy (Signed)
[facility] [place] [address]  [place], [place], [address] Phone: [phone]   Fax:  [phone]  Physical Therapy Evaluation  Patient Details  Name: Jim Carson MRN: [id number] Date of Birth: 1951-02-25 Referring Provider (PT): Gaynelle Arabian, MD   Encounter Date: 01/15/2021   PT End of Session - 01/15/21 0802     Visit Number 1    Number of Visits 8    Date for PT Re-Evaluation 02/12/21    Authorization Type Medicare/AARP    PT Start Time 0802    PT Stop Time 0847    PT Time Calculation (min) 45 min    Activity Tolerance Patient tolerated treatment well    Behavior During Therapy [facility] for tasks assessed/performed             Past Medical History:  Diagnosis Date   Abnormal gait    due to parkinson's,  uses cane   Allergic rhinitis    Benign essential hypertension    Chronic constipation    Deviated septum    GERD (gastroesophageal reflux disease)    History of multiple concussions    per pt as teen playing football, no residual   Hypothyroidism    followed by pcp   Left shoulder pain    OA (osteoarthritis)    OSA on CPAP    Parkinson disease [facility])    neurologist-- dr b. scott  @[facility]  [facility]   Vitamin B12 deficiency    Vitamin D deficiency    Wears glasses     Past Surgical History:  Procedure Laterality Date   APPENDECTOMY  07/1964   LAPAROSCOPIC CHOLECYSTECTOMY  01-21-2017   @[facility] , [place]   POSTERIOR FUSION CERVICAL SPINE  08-15-2011   @[facility] , [place]   w/ laminectomy and decompression-- C3 -- T1   POSTERIOR LAMINECTOMY / DECOMPRESSION LUMBAR SPINE  07-30-2013   @[facility] , [place]   bilateral T11 - 12,  L2 --5   REMOVAL LEFT T1 PARASPINOUS MASS  02-12-2016   @[facility] ,  [place]   desmoid fibromatosis   SHOULDER ARTHROSCOPY WITH ROTATOR CUFF REPAIR Left 12/15/2018   Procedure: SHOULDER ARTHROSCOPY, biceps tenodesis labored Debridement, submacromial decompression;  Surgeon: Sydnee Cabal, MD;   Location: [facility];  Service: Orthopedics;  Laterality: Left;  interscalene block   TOTAL HIP 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    Subjective Assessment - 01/15/21 0806     Subjective L THA in 08/2017. Dislocated the first time at Thanksgiving 2019. Most recently dislocated on 12/27/20 while attempting to pull a stubborn weed in his garden. Pt cautioned to limit activity include [place] boxing & stretching such as SK[employer] due to THA precautions for ~6 weeks. Lack confidence in his hip. Normally uses cane or RW d/t Parkinson's, but looking into rollator.    Pertinent History Parkinson's disease, neck surgery, lumbar surgery    Limitations Standing;Walking    Patient Stated Goals "to get confidence back in my hip"    Currently in Pain? Yes    Pain Score 1     Pain Location Hip    Pain Orientation Left    Pain Descriptors / Indicators Dull;Aching    Pain Type Acute pain    Pain Radiating Towards n/a    Pain Onset 1 to 4 weeks ago    Pain Frequency Intermittent    Aggravating Factors  being on his feet/walking    Pain  Relieving Factors Aleve, Ibuprofen                [facility] PT Assessment - 01/15/21 0802       Assessment   Medical Diagnosis L THA dislocation    Referring Provider (PT) Gaynelle Arabian, MD    Onset Date/Surgical Date 12/27/20    Hand Dominance Left    Next MD Visit 02/16/21      Precautions   Precautions Fall;Anterior Hip      Balance Screen   Has the patient fallen in the past 6 months Yes    How many times? 1    Has the patient had a decrease in activity level because of a fear of falling?  No      Home Environment   Living Environment Private residence    Living Arrangements Spouse/significant other    Available Help at Discharge Family    Type of [place] Access Level entry    [place] Two level;Able to live on main level with bedroom/bathroom    Alternate Level  Stairs-Number of Steps 12    Alternate Level Stairs-Rails Right;Left    Home Equipment Walker - 2 wheels;Kasandra Knudsen - single point      Prior Function   Level of Independence Independent    Leisure playing guitar, goes to rock steady boxing, goes to the [employer] 2-3 x /wk      Cognition   Overall Cognitive Status Within Functional Limits for tasks assessed      Sensation   Light Touch Impaired by gross assessment   RUE s/p neck surgery     ROM / Strength   AROM / PROM / Strength Strength      Strength   Strength Assessment Site Hip;Knee;Ankle    Right Hip Flexion 5/5    Right Hip Extension 5/5    Right Hip ABduction 5/5    Right Hip ADduction 5/5    Left Hip Flexion 4/5    Left Hip Extension 4+/5    Left Hip ABduction 4/5    Left Hip ADduction 4+/5    Right Knee Flexion 5/5    Right Knee Extension 5/5    Left Knee Flexion 5/5    Left Knee Extension 5/5    Right Ankle Dorsiflexion 4/5    Right Ankle Plantar Flexion 3+/5   with manual resistance; unable to perform SLS heel raise   Left Ankle Dorsiflexion 4+/5    Left Ankle Plantar Flexion 3+/5   with manual resistance; unable to perform SLS heel raise                       Objective measurements completed on examination: See above findings.       [facility] Adult PT Treatment/Exercise - 01/15/21 0802       Exercises   Exercises Knee/Hip      Knee/Hip Exercises: Stretches   Passive Hamstring Stretch Left;1 rep;30 seconds    Passive Hamstring Stretch Limitations supine with strap    Hip Flexor Stretch Left;1 rep;30 seconds    Hip Flexor Stretch Limitations mod thomas with strap      Knee/Hip Exercises: Standing   Hip Abduction Left;10 reps;Stengthening    Hip Extension Left;10 reps;Stengthening;Knee straight      Knee/Hip Exercises: Supine   Straight Leg Raises Left;10 reps;Strengthening    Straight Leg Raises Limitations cues for quad set prior to initiation of lift  PT  Education - 01/15/21 0845     Education Details PT eval findings, anticipated POC, review of hip precautions with impact on daily activity and mobility    Person(s) Educated Patient    Methods Explanation;Demonstration    Comprehension Verbalized understanding              PT Short Term Goals - 12/22/20 1349       PT SHORT TERM GOAL #1   Title ---               PT Long Term Goals - 01/15/21 0847       PT LONG TERM GOAL #1   Title Patient will be independent with ongoing/advanced HEP for self-management at home    Status New    Target Date 02/12/21      PT LONG TERM GOAL #2   Title Patient will demonstrate improved L LE strength to >/= 4+/5 for improved stability and ease of mobility    Status New    Target Date 02/12/21      PT LONG TERM GOAL #3   Title Patient will ambulate with normal gait pattern and good gait stability with rollator or LRAD    Status New    Target Date 02/12/21      PT LONG TERM GOAL #4   Title Patient will experience no further instances of hip dislocation    Status New    Target Date 02/12/21                    Plan - 01/15/21 0847     Clinical Impression Statement Jim Carson is a 70 y/o male who presents to OP PT s/p 2nd dislocation of his L THA on 12/27/20 which was reduced under light anesthesia. His initial THA was in March 2019 with the initial dislocation later that year around Thanksgiving. He reports his surgeon wants him to adhere to THA precautions for ~6 weeks. Current deficits include limited L hip ROM per precautions, decreased proximal flexibility and mild LE weakness. He also has Parkinson's disease for which he has recently completed a PT episode at an outside clinic in May 2022 at which time it was recommended that he use a rollator and AFO for his R LE - he reports looking into the rollator but mention of the AFO. He was screened for new needs by the [facility] neuro clinic 5 days prior to the dislocation with testing similar  to prior discharge measures and was encouraged to continue with his prior HEP. Jim Carson will benefit from skilled PT for LE strengthening to promote stability for his THA prosthesis as well as balance training and training in use of rollator if he proceeds with obtaining a rollator.    Personal Factors and Comorbidities Comorbidity 3+;Past/Current Experience    Comorbidities L THR 2019, L RTC repair 2020GERD, OA,C-spine fusion, post-laminectory lumbar spine, Parkinson's disease    Examination-Activity Limitations Bathing;Bed Mobility;Bend;Dressing;Lift;Locomotion Level;Squat;Transfers    Examination-Participation Restrictions Community Activity;Yard Work    [employer]chant [employer] officer Evolving/Moderate complexity    Clinical Decision Making Moderate    Rehab Potential Good    PT Frequency 2x / week    PT Duration 4 weeks    PT Treatment/Interventions ADLs/Self Care Home Management;Cryotherapy;Electrical Stimulation;Iontophoresis 4mg /ml Dexamethasone;DME Instruction;Gait training;Stair training;Functional mobility training;Therapeutic activities;Therapeutic exercise;Balance training;Neuromuscular re-education;Patient/family education;Manual techniques;Passive range of motion    PT Next Visit Plan Create HEP handout for exercises instructed on eval (MedBridge not available d/t internet connectivity issues); review of  hip precuations; stretching and strengthening adhereing to hip precautions    Consulted and Agree with Plan of Care Patient             Patient will benefit from skilled therapeutic intervention in order to improve the following deficits and impairments:  Abnormal gait, Decreased activity tolerance, Decreased balance, Decreased coordination, Decreased endurance, Decreased knowledge of precautions, Decreased knowledge of use of DME, Decreased mobility, Decreased range of motion, Decreased safety awareness, Decreased strength, Difficulty walking, Increased edema, Increased muscle  spasms, Impaired perceived functional ability, Impaired flexibility, Improper body mechanics, Postural dysfunction, Pain  Visit Diagnosis: Stiffness of left hip, not elsewhere classified  Pain in left hip  Muscle weakness (generalized)  Other abnormalities of gait and mobility     Problem List There are no problems to display for this patient.   Percival Spanish, PT, MPT 01/15/2021, 6:28 PM  [facility] [address]  [place], [place], [address] Phone: [phone]   Fax:  [phone]  Name: Jim Carson MRN: [id number] Date of Birth: 1950/10/26

## 2021-01-24 ENCOUNTER — Other Ambulatory Visit: Payer: Self-pay

## 2021-01-24 ENCOUNTER — Ambulatory Visit: Payer: Medicare Other

## 2021-01-24 DIAGNOSIS — M6281 Muscle weakness (generalized): Secondary | ICD-10-CM

## 2021-01-24 DIAGNOSIS — M25652 Stiffness of left hip, not elsewhere classified: Secondary | ICD-10-CM | POA: Diagnosis not present

## 2021-01-24 DIAGNOSIS — R2689 Other abnormalities of gait and mobility: Secondary | ICD-10-CM

## 2021-01-24 DIAGNOSIS — M25552 Pain in left hip: Secondary | ICD-10-CM

## 2021-01-24 NOTE — Therapy (Signed)
[facility] [address]  [place], [place], [address] Phone: [phone]   Fax:  [phone]  Physical Therapy Treatment  Patient Details  Name: Jim Carson MRN: [id number] Date of Birth: Aug 18, 1950 Referring Provider (PT): Gaynelle Arabian, MD   Encounter Date: 01/24/2021   PT End of Session - 01/24/21 0930     Visit Number 2    Number of Visits 8    Date for PT Re-Evaluation 02/12/21    Authorization Type Medicare/AARP    PT Start Time 0843    PT Stop Time 0927    PT Time Calculation (min) 44 min    Activity Tolerance Patient tolerated treatment well    Behavior During Therapy [facility] for tasks assessed/performed             Past Medical History:  Diagnosis Date   Abnormal gait    due to parkinson's,  uses cane   Allergic rhinitis    Benign essential hypertension    Chronic constipation    Deviated septum    GERD (gastroesophageal reflux disease)    History of multiple concussions    per pt as teen playing football, no residual   Hypothyroidism    followed by pcp   Left shoulder pain    OA (osteoarthritis)    OSA on CPAP    Parkinson disease [facility])    neurologist-- dr b. 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   Subjective Assessment - 01/24/21 0848     Subjective Pt reports no pain in hip.    Pertinent History Parkinson's disease, neck surgery, lumbar surgery    Patient Stated Goals "to get confidence back in my hip"    Currently in Pain? No/denies                               [facility] Adult PT Treatment/Exercise - 01/24/21 0001       Ambulation/Gait   Ambulation/Gait --    Ambulation/Gait Assistance --    Ambulation Distance (Feet) --    Assistive device --      Exercises   Exercises Knee/Hip      Knee/Hip Exercises: Stretches   Passive Hamstring Stretch Left;2 reps;30 seconds    Passive Hamstring Stretch Limitations supine with strap    Hip Flexor Stretch Left;2 reps;30 seconds    Hip Flexor Stretch Limitations mod thomas with strap      Knee/Hip Exercises: Aerobic   Nustep  L3x100mn      Knee/Hip Exercises: Standing   Hip Abduction Stengthening;Both;10 reps;Knee straight    Abduction Limitations red TB above knees    Hip Extension Stengthening;Both;10 reps;Knee straight    Extension Limitations red TB above knees    Functional Squat 20 reps    Functional Squat Limitations mini squats at counter    Other Standing Knee Exercises side steps with red TB 4x along the counter      Knee/Hip Exercises: Seated   Ball Squeeze 10x5"    Sit to Sand 10 reps;without UE support   elevated mat table     Knee/Hip Exercises: Supine   Bridges Strengthening;Both;15 reps    Bridges Limitations straight leg on orange pball    Straight Leg Raises Strengthening;Left;10 reps    Straight Leg Raises Limitations 5 sec holds with QS                    PT Education - 01/24/21 0925     Education Details HEP update: Access Code: [email or web address]    Person(s) Educated  Patient    Methods Explanation;Demonstration;Verbal cues;Tactile cues;Handout    Comprehension Verbalized understanding;Returned demonstration;Verbal cues required;Tactile cues required;Need further instruction              PT Short Term Goals - 12/22/20 1349       PT SHORT TERM GOAL #1   Title ---               PT Long Term Goals - 01/24/21 0930       PT LONG TERM GOAL #1   Title Patient will be independent with ongoing/advanced HEP for self-management at home    Status On-going      PT LONG TERM GOAL #2   Title Patient will demonstrate improved L LE strength to >/= 4+/5 for improved stability and ease of mobility    Status On-going      PT LONG TERM GOAL #3   Title Patient will ambulate with normal gait pattern and good gait stability with rollator or LRAD    Status On-going      PT LONG TERM GOAL #4   Title Patient will experience no further instances of hip dislocation    Status On-going                   Plan - 01/24/21 0932     Clinical Impression Statement Pt responded wel to progressed treatment. Cues required for proper post WS with mini squats and to prevent ER with hip abduction. Performed straight leg bridges due to him reporting cramps in his L hamstrings, which was further relieved with hamstring stretches. Updated HEP with exercises to maximize hip strength and flexibility, pt demonstrated understanding. He was advised of hip precuations.  He also demonstrates what looks to be a festinating gait at times d/t PD, he may benefit from a rollator or RW.    Personal Factors and Comorbidities Comorbidity 3+;Past/Current Experience    Comorbidities L THR 2019, L RTC repair 2020GERD, OA,C-spine fusion, post-laminectory lumbar spine, Parkinson's disease    PT Frequency 2x / week    PT Duration 4 weeks    PT Treatment/Interventions ADLs/Self Care Home Management;Cryotherapy;Electrical Stimulation;Iontophoresis '4mg'$ /ml Dexamethasone;DME Instruction;Gait  training;Stair training;Functional mobility training;Therapeutic activities;Therapeutic exercise;Balance training;Neuromuscular re-education;Patient/family education;Manual techniques;Passive range of motion    PT Next Visit Plan Create HEP handout for exercises instructed on eval (MedBridge not available d/t internet connectivity issues); review of hip precuations; stretching and strengthening  adhereing to hip precautions    Consulted and Agree with Plan of Care Patient             Patient will benefit from skilled therapeutic intervention in order to improve the following deficits and impairments:  Abnormal gait, Decreased activity tolerance, Decreased balance, Decreased coordination, Decreased endurance, Decreased knowledge of precautions, Decreased knowledge of use of DME, Decreased mobility, Decreased range of motion, Decreased safety awareness, Decreased strength, Difficulty walking, Increased edema, Increased muscle spasms, Impaired perceived functional ability, Impaired flexibility, Improper body mechanics, Postural dysfunction, Pain  Visit Diagnosis: Stiffness of left hip, not elsewhere classified  Pain in left hip  Muscle weakness (generalized)  Other abnormalities of gait and mobility     Problem List There are no problems to display for this patient.   Artist Pais, PTA 01/24/2021, 9:41 AM  [facility] [address]  [place], [place], [address] Phone: [phone]   Fax:  [phone]  Name: Jim Carson MRN: [id number] Date of Birth: Jul 10, 1950

## 2021-01-29 ENCOUNTER — Ambulatory Visit: Payer: Medicare Other | Attending: Orthopedic Surgery

## 2021-01-29 ENCOUNTER — Other Ambulatory Visit: Payer: Self-pay

## 2021-01-29 DIAGNOSIS — M25652 Stiffness of left hip, not elsewhere classified: Secondary | ICD-10-CM

## 2021-01-29 DIAGNOSIS — M6281 Muscle weakness (generalized): Secondary | ICD-10-CM | POA: Diagnosis present

## 2021-01-29 DIAGNOSIS — M25552 Pain in left hip: Secondary | ICD-10-CM | POA: Diagnosis present

## 2021-01-29 DIAGNOSIS — R2689 Other abnormalities of gait and mobility: Secondary | ICD-10-CM | POA: Diagnosis present

## 2021-01-29 NOTE — Therapy (Signed)
[facility] [address]  [place], [place], [address] Phone: [phone]   Fax:  [phone]  Physical Therapy Treatment  Patient Details  Name: Jim Carson MRN: [id number] Date of Birth: 07-30-1950 Referring Provider (PT): Gaynelle Arabian, MD   Encounter Date: 01/29/2021   PT End of Session - 01/29/21 0846     Visit Number 3    Number of Visits 8    Date for PT Re-Evaluation 02/12/21    Authorization Type Medicare/AARP    PT Start Time 0803    PT Stop Time 0845    PT Time Calculation (min) 42 min    Activity Tolerance Patient tolerated treatment well    Behavior During Therapy [facility] for tasks assessed/performed             Past Medical History:  Diagnosis Date   Abnormal gait    due to parkinson's,  uses cane   Allergic rhinitis    Benign essential hypertension    Chronic constipation    Deviated septum    GERD (gastroesophageal reflux disease)    History of multiple concussions    per pt as teen playing football, no residual   Hypothyroidism    followed by pcp   Left shoulder pain    OA (osteoarthritis)    OSA on CPAP    Parkinson disease [facility])    neurologist-- dr b. scott  '@[facility]'$  Neurology   Vitamin B12 deficiency    Vitamin D deficiency    Wears glasses     Past Surgical History:  Procedure Laterality Date   APPENDECTOMY  07/1964   LAPAROSCOPIC CHOLECYSTECTOMY  01-21-2017   '@[facility]'$ , [place]   POSTERIOR FUSION CERVICAL SPINE  08-15-2011   '@[facility]'$ , [place]   w/ laminectomy and decompression-- C3 -- T1   POSTERIOR LAMINECTOMY / DECOMPRESSION LUMBAR SPINE  07-30-2013   [facility] , [place]   bilateral T11 - 12,  L2 --5   REMOVAL LEFT T1 PARASPINOUS MASS  02-12-2016   [facility] ,  [place]   desmoid fibromatosis   SHOULDER ARTHROSCOPY WITH ROTATOR CUFF REPAIR Left 12/15/2018   Procedure: SHOULDER ARTHROSCOPY, biceps tenodesis labored Debridement, submacromial decompression;  Surgeon: Sydnee Cabal, MD;   Location: [place];  Service: Orthopedics;  Laterality: Left;  interscalene block   TOTAL HIP ARTHROPLASTY Left 09/18/2017   '@[facility]'$ , [place]    There were no vitals filed for this visit.   Subjective Assessment - 01/29/21 0805     Subjective Pt is doing well, no concerns with HEP.    Pertinent History Parkinson's disease, neck surgery, lumbar surgery    Patient Stated Goals "to get confidence back in my hip"    Currently in Pain? No/denies                               [facility] Adult PT Treatment/Exercise - 01/29/21 0001       Exercises   Exercises Knee/Hip      Knee/Hip Exercises: Aerobic   Recumbent Bike L5x25mn      Knee/Hip Exercises: Standing   Lateral Step Up Left;10 reps;Hand Hold: 2;Step Height: 4"    Forward Step Up Left;10 reps;Hand Hold: 2;Step Height: 4"    Functional Squat 2 sets;10 reps    Functional Squat Limitations at counter    Other Standing Knee Exercises side steps with green TB 3x along the counter; 2 trials      Knee/Hip  Exercises: Supine   Bridges with Diona Foley Squeeze Strengthening;Both;10 reps    Straight Leg Raises Strengthening;Left;10 reps    Straight Leg Raises Limitations 5 sec holds with QS    Other Supine Knee/Hip Exercises pillow squeezes 10x5"      Knee/Hip Exercises: Sidelying   Clams 10x3", L red TB                 Balance Exercises - 01/29/21 0001       Balance Exercises: Standing   Standing Eyes Closed Narrow base of support (BOS);Solid surface;2 reps;30 secs    Toe Raise Both;10 reps                 PT Short Term Goals - 12/22/20 1349       PT SHORT TERM GOAL #1   Title ---               PT Long Term Goals - 01/24/21 0930       PT LONG TERM GOAL #1   Title Patient will be independent with ongoing/advanced HEP for self-management at home    Status On-going      PT LONG TERM GOAL #2   Title Patient will demonstrate improved L LE strength to >/= 4+/5 for improved  stability and ease of mobility    Status On-going      PT LONG TERM GOAL #3   Title Patient will ambulate with normal gait pattern and good gait stability with rollator or LRAD    Status On-going      PT LONG TERM GOAL #4   Title Patient will experience no further instances of hip dislocation    Status On-going                   Plan - 01/29/21 0847     Clinical Impression Statement Patient responded well to treatment. Provided updates to HEP as he stated that he could use more of a challenge with home exercises. Initiated balance exercises with him showing mild sways mostly in the posterior direction. Also progressed weight bearing ther ex with step ups, he required mod cues for control and foot clearance. Pt also demonstrated fatigue from the side steps today.    Personal Factors and Comorbidities Comorbidity 3+;Past/Current Experience    Comorbidities L THR 2019, L RTC repair 2020GERD, OA,C-spine fusion, post-laminectory lumbar spine, Parkinson's disease    PT Frequency 2x / week    PT Duration 4 weeks    PT Treatment/Interventions ADLs/Self Care Home Management;Cryotherapy;Electrical Stimulation;Iontophoresis '4mg'$ /ml Dexamethasone;DME Instruction;Gait training;Stair training;Functional mobility training;Therapeutic activities;Therapeutic exercise;Balance training;Neuromuscular re-education;Patient/family education;Manual techniques;Passive range of motion    PT Next Visit Plan stretching and strengthening adhereing to hip precautions    Consulted and Agree with Plan of Care Patient             Patient will benefit from skilled therapeutic intervention in order to improve the following deficits and impairments:  Abnormal gait, Decreased activity tolerance, Decreased balance, Decreased coordination, Decreased endurance, Decreased knowledge of precautions, Decreased knowledge of use of DME, Decreased mobility, Decreased range of motion, Decreased safety awareness, Decreased  strength, Difficulty walking, Increased edema, Increased muscle spasms, Impaired perceived functional ability, Impaired flexibility, Improper body mechanics, Postural dysfunction, Pain  Visit Diagnosis: Stiffness of left hip, not elsewhere classified  Pain in left hip  Muscle weakness (generalized)  Other abnormalities of gait and mobility     Problem List There are no problems to display for this patient.  Artist Pais, PTA 01/29/2021, 10:25 AM  [facility] [address]  [place], [place], [address] Phone: [phone]   Fax:  [phone]  Name: Jim Carson MRN: [id number] Date of Birth: 30-Aug-1950

## 2021-01-31 ENCOUNTER — Other Ambulatory Visit: Payer: Self-pay

## 2021-01-31 ENCOUNTER — Ambulatory Visit: Payer: Medicare Other

## 2021-01-31 DIAGNOSIS — M25652 Stiffness of left hip, not elsewhere classified: Secondary | ICD-10-CM | POA: Diagnosis not present

## 2021-01-31 DIAGNOSIS — M6281 Muscle weakness (generalized): Secondary | ICD-10-CM

## 2021-01-31 DIAGNOSIS — R2689 Other abnormalities of gait and mobility: Secondary | ICD-10-CM

## 2021-01-31 DIAGNOSIS — M25552 Pain in left hip: Secondary | ICD-10-CM

## 2021-01-31 NOTE — Therapy (Signed)
[facility] [address]  [place], [place], [address] Phone: [phone]   Fax:  [phone]  Physical Therapy Treatment  Patient Details  Name: Jim Carson MRN: [id number] Date of Birth: [birth year] Referring Provider (PT): Gaynelle Arabian, MD   Encounter Date: 01/31/2021   PT End of Session - 01/31/21 0846     Visit Number 4    Number of Visits 8    Date for PT Re-Evaluation 02/12/21    Authorization Type Medicare/AARP    PT Start Time 0800    PT Stop Time 0843    PT Time Calculation (min) 43 min             Past Medical History:  Diagnosis Date   Abnormal gait    due to parkinson's,  uses cane   Allergic rhinitis    Benign essential hypertension    Chronic constipation    Deviated septum    GERD (gastroesophageal reflux disease)    History of multiple concussions    per pt as teen playing football, no residual   Hypothyroidism    followed by pcp   Left shoulder pain    OA (osteoarthritis)    OSA on CPAP    Parkinson disease [facility])    neurologist-- dr b. scott  '@[facility]'$  Neurology   Vitamin B12 deficiency    Vitamin D deficiency    Wears glasses     Past Surgical History:  Procedure Laterality Date   APPENDECTOMY  07/1964   LAPAROSCOPIC CHOLECYSTECTOMY  01-21-2017   '@[facility]'$ , [place]   POSTERIOR FUSION CERVICAL SPINE  08-15-2011   '@[facility]'$ , [place]   w/ laminectomy and decompression-- C3 -- T1   POSTERIOR LAMINECTOMY / DECOMPRESSION LUMBAR SPINE  07-30-2013   [facility] , [place]   bilateral T11 - 12,  L2 --5   REMOVAL LEFT T1 PARASPINOUS MASS  02-12-2016   [facility] ,  [place]   desmoid fibromatosis   SHOULDER ARTHROSCOPY WITH ROTATOR CUFF REPAIR Left 12/15/2018   Procedure: SHOULDER ARTHROSCOPY, biceps tenodesis labored Debridement, submacromial decompression;  Surgeon: Sydnee Cabal, MD;  Location: [place];  Service: Orthopedics;  Laterality: Left;  interscalene block   TOTAL HIP  ARTHROPLASTY Left 09/18/2017   '@[facility]'$ , [place]    There were no vitals filed for this visit.   Subjective Assessment - 01/31/21 0803     Subjective Doing well today, feeling more confident about his hip.    Pertinent History Parkinson's disease, neck surgery, lumbar surgery    Patient Stated Goals "to get confidence back in my hip"    Currently in Pain? No/denies                               [facility] Adult PT Treatment/Exercise - 01/31/21 0001       Knee/Hip Exercises: Stretches   Passive Hamstring Stretch Both;30 seconds    Passive Hamstring Stretch Limitations supine with strap      Knee/Hip Exercises: Aerobic   Nustep L5x23mn      Knee/Hip Exercises: Seated   Sit to Sand 2 sets;10 reps;without UE support   yellow weighted ball     Knee/Hip Exercises: Supine   Bridges with Ball Squeeze Strengthening;Both;10 reps   5" hold     Knee/Hip Exercises: Prone   Hamstring Curl 2 sets;10 reps    Hamstring Curl Limitations 3# weight    Hip Extension Strengthening;Both;10 reps    Hip  Extension Limitations knee bent                 Balance Exercises - 01/31/21 0001       Balance Exercises: Standing   Standing Eyes Closed Narrow base of support (BOS);Foam/compliant surface   4 way WS 10 reps on airex   Other Standing Exercises hip extension from airex 10 repx each with 2# weight               PT Education - 01/31/21 0845     Education Details Updated HEP and review.    Person(s) Educated Patient    Methods Explanation;Demonstration;Verbal cues;Handout    Comprehension Verbalized understanding;Returned demonstration;Verbal cues required              PT Short Term Goals - 12/22/20 1349       PT SHORT TERM GOAL #1   Title ---               PT Long Term Goals - 01/24/21 0930       PT LONG TERM GOAL #1   Title Patient will be independent with ongoing/advanced HEP for self-management at home    Status On-going      PT LONG  TERM GOAL #2   Title Patient will demonstrate improved L LE strength to >/= 4+/5 for improved stability and ease of mobility    Status On-going      PT LONG TERM GOAL #3   Title Patient will ambulate with normal gait pattern and good gait stability with rollator or LRAD    Status On-going      PT LONG TERM GOAL #4   Title Patient will experience no further instances of hip dislocation    Status On-going                   Plan - 01/31/21 0847     Clinical Impression Statement Pt requires cues with all standing ther ex for upright posture. Cues needed to keep his weight shifted onto the center of his feet with SL WB as he tends to put most weight on the lateral portion of his ankle. He demonstrated muscle fatigue with prone hamstring curls and hip extensions. Post session he demonstrates decent stability with SPC, at times he requires cues for pacing during gait. but today he demonstrates more control with ambulation.    Personal Factors and Comorbidities Comorbidity 3+;Past/Current Experience    Comorbidities L THR 2019, L RTC repair 2020GERD, OA,C-spine fusion, post-laminectory lumbar spine, Parkinson's disease    PT Frequency 2x / week    PT Duration 4 weeks    PT Treatment/Interventions ADLs/Self Care Home Management;Cryotherapy;Electrical Stimulation;Iontophoresis '4mg'$ /ml Dexamethasone;DME Instruction;Gait training;Stair training;Functional mobility training;Therapeutic activities;Therapeutic exercise;Balance training;Neuromuscular re-education;Patient/family education;Manual techniques;Passive range of motion    PT Next Visit Plan stretching and strengthening adhereing to hip precautions; dynamic balance    Consulted and Agree with Plan of Care Patient             Patient will benefit from skilled therapeutic intervention in order to improve the following deficits and impairments:  Abnormal gait, Decreased activity tolerance, Decreased balance, Decreased coordination,  Decreased endurance, Decreased knowledge of precautions, Decreased knowledge of use of DME, Decreased mobility, Decreased range of motion, Decreased safety awareness, Decreased strength, Difficulty walking, Increased edema, Increased muscle spasms, Impaired perceived functional ability, Impaired flexibility, Improper body mechanics, Postural dysfunction, Pain  Visit Diagnosis: Stiffness of left hip, not elsewhere classified  Pain in left hip  Muscle weakness (generalized)  Other abnormalities of gait and mobility     Problem List There are no problems to display for this patient.   Artist Pais, PTA 01/31/2021, 11:25 AM  [facility] [address]  [place], [place], [address] Phone: [phone]   Fax:  [phone]  Name: Deiondre Chorney MRN: [id number] Date of Birth: April 15, 1951

## 2021-02-05 ENCOUNTER — Other Ambulatory Visit: Payer: Self-pay

## 2021-02-05 ENCOUNTER — Ambulatory Visit: Payer: Medicare Other

## 2021-02-05 DIAGNOSIS — M6281 Muscle weakness (generalized): Secondary | ICD-10-CM

## 2021-02-05 DIAGNOSIS — M25652 Stiffness of left hip, not elsewhere classified: Secondary | ICD-10-CM | POA: Diagnosis not present

## 2021-02-05 DIAGNOSIS — R2689 Other abnormalities of gait and mobility: Secondary | ICD-10-CM

## 2021-02-05 DIAGNOSIS — M25552 Pain in left hip: Secondary | ICD-10-CM

## 2021-02-05 NOTE — Therapy (Signed)
[facility] [address]  [place], [place], [address] Phone: [phone]   Fax:  [phone]  Physical Therapy Treatment  Patient Details  Name: Jim Carson MRN: [id number] Date of Birth: 1951/05/04 Referring Provider (PT): Gaynelle Arabian, MD   Encounter Date: 02/05/2021   PT End of Session - 02/05/21 0843     Visit Number 5    Number of Visits 8    Date for PT Re-Evaluation 02/12/21    Authorization Type Medicare/AARP    PT Start Time 0801    PT Stop Time 0843    PT Time Calculation (min) 42 min    Activity Tolerance Patient tolerated treatment well    Behavior During Therapy [facility] for tasks assessed/performed             Past Medical History:  Diagnosis Date   Abnormal gait    due to parkinson's,  uses cane   Allergic rhinitis    Benign essential hypertension    Chronic constipation    Deviated septum    GERD (gastroesophageal reflux disease)    History of multiple concussions    per pt as teen playing football, no residual   Hypothyroidism    followed by pcp   Left shoulder pain    OA (osteoarthritis)    OSA on CPAP    Parkinson disease [facility])    neurologist-- dr b. scott  _0  Neurology   Vitamin B12 deficiency    Vitamin D deficiency    Wears glasses     Past Surgical History:  Procedure Laterality Date   APPENDECTOMY  07/1964   LAPAROSCOPIC CHOLECYSTECTOMY  01-21-2017   _1 , [place]   POSTERIOR FUSION CERVICAL SPINE  08-15-2011   _2 , [place]   w/ laminectomy and decompression-- C3 -- T1   POSTERIOR LAMINECTOMY / DECOMPRESSION LUMBAR SPINE  07-30-2013   _3 , [place]   bilateral T11 - 12,  L2 --5   REMOVAL LEFT T1 PARASPINOUS MASS  02-12-2016   _4 ,  [place]   desmoid fibromatosis   SHOULDER ARTHROSCOPY WITH ROTATOR CUFF REPAIR Left 12/15/2018   Procedure: SHOULDER ARTHROSCOPY, biceps tenodesis labored Debridement, submacromial decompression;  Surgeon: Sydnee Cabal, MD;   Location: [place];  Service: Orthopedics;  Laterality: Left;  interscalene block   TOTAL HIP ARTHROPLASTY Left 09/18/2017   _5 , [place]    There were no vitals filed for this visit.   Subjective Assessment - 02/05/21 0803     Subjective Everything is doing good, at times he does not trust hip when getting off the floor.    Pertinent History Parkinson's disease, neck surgery, lumbar surgery    Patient Stated Goals "to get confidence back in my hip"    Currently in Pain? No/denies                [facility] PT Assessment - 02/05/21 0001       Strength   Left Hip Flexion 4+/5    Left Hip Extension 4+/5    Left Hip ABduction 4+/5    Left Hip ADduction 4+/5                           [facility] Adult PT Treatment/Exercise - 02/05/21 0001       Exercises   Exercises Knee/Hip      Knee/Hip Exercises: Aerobic   Nustep L5x59mn      Knee/Hip Exercises: Standing   Functional Squat 2  sets;10 reps    Functional Squat Limitations TRX;cues to prevent knees over toes    Other Standing Knee Exercises lat pull with march 10 reps with TRX      Knee/Hip Exercises: Seated   Long Arc Quad Strengthening;Left;2 sets;10 reps;Weights    Long Arc Quad Weight 2 lbs.    Hamstring Curl Strengthening;Both;2 sets;10 reps    Hamstring Limitations green TB                 Balance Exercises - 02/05/21 0001       Balance Exercises: Standing   Tandem Stance Eyes open;Intermittent upper extremity support;3 reps;20 secs   LOB x1; cues to WS equally   Tandem Gait Forward;Upper extremity support;3 reps   along the counter with [facility]   Retro Gait Upper extremity support;2 reps   with SPC along counter     Balance Exercises: Seated   Heel Raises Both;10 reps                 PT Short Term Goals - 12/22/20 1349       PT SHORT TERM GOAL #1   Title ---               PT Long Term Goals - 02/05/21 0816       PT LONG TERM GOAL #1   Title Patient  will be independent with ongoing/advanced HEP for self-management at home    Status On-going      PT LONG TERM GOAL #2   Title Patient will demonstrate improved L LE strength to >/= 4+/5 for improved stability and ease of mobility    Status Partially Met   met for hip and knee     PT LONG TERM GOAL #3   Title Patient will ambulate with normal gait pattern and good gait stability with rollator or LRAD    Status On-going      PT LONG TERM GOAL #4   Title Patient will experience no further instances of hip dislocation    Status On-going                   Plan - 02/05/21 0845     Clinical Impression Statement Pt has met his strength goal with regards to the hip and knee muscles from the testing today (PF not tested). Progressed treatment focusing on balance and stimulating strategies of recovery. Pt required cues for equal WS during tandem stance and had LOB x1 during this. Also cues required for pace with dynamic gait activites. CGA required during balance exercises for safety. He is progressing well and would continue to benefit from dynamic balance exercises to improve stability with everyday ambulation.    Personal Factors and Comorbidities Comorbidity 3+;Past/Current Experience    Comorbidities L THR 2019, L RTC repair 2020GERD, OA,C-spine fusion, post-laminectory lumbar spine, Parkinson's disease    PT Frequency 2x / week    PT Duration 4 weeks    PT Treatment/Interventions ADLs/Self Care Home Management;Cryotherapy;Electrical Stimulation;Iontophoresis 40m/ml Dexamethasone;DME Instruction;Gait training;Stair training;Functional mobility training;Therapeutic activities;Therapeutic exercise;Balance training;Neuromuscular re-education;Patient/family education;Manual techniques;Passive range of motion    PT Next Visit Plan stretching and strengthening adhereing to hip precautions; dynamic balance exercises    Consulted and Agree with Plan of Care Patient             Patient  will benefit from skilled therapeutic intervention in order to improve the following deficits and impairments:  Abnormal gait, Decreased activity tolerance, Decreased balance, Decreased coordination, Decreased endurance,  Decreased knowledge of precautions, Decreased knowledge of use of DME, Decreased mobility, Decreased range of motion, Decreased safety awareness, Decreased strength, Difficulty walking, Increased edema, Increased muscle spasms, Impaired perceived functional ability, Impaired flexibility, Improper body mechanics, Postural dysfunction, Pain  Visit Diagnosis: Stiffness of left hip, not elsewhere classified  Pain in left hip  Muscle weakness (generalized)  Other abnormalities of gait and mobility     Problem List There are no problems to display for this patient.   Artist Pais, PTA 02/05/2021, 8:56 AM  [facility] [address]  [place], [place], [address] Phone: [phone]   Fax:  [phone]  Name: Jim Carson MRN: [id number] Date of Birth: 10-13-1950

## 2021-02-07 ENCOUNTER — Encounter: Payer: Self-pay | Admitting: Physical Therapy

## 2021-02-07 ENCOUNTER — Other Ambulatory Visit: Payer: Self-pay

## 2021-02-07 ENCOUNTER — Ambulatory Visit: Payer: Medicare Other | Admitting: Physical Therapy

## 2021-02-07 DIAGNOSIS — M25552 Pain in left hip: Secondary | ICD-10-CM

## 2021-02-07 DIAGNOSIS — M25652 Stiffness of left hip, not elsewhere classified: Secondary | ICD-10-CM

## 2021-02-07 DIAGNOSIS — R2689 Other abnormalities of gait and mobility: Secondary | ICD-10-CM

## 2021-02-07 DIAGNOSIS — M6281 Muscle weakness (generalized): Secondary | ICD-10-CM

## 2021-02-07 NOTE — Patient Instructions (Addendum)
      Access Code: [email or web address] URL: [email or web address] Date: 02/07/2021 Prepared by: Annie Paras  Exercises Standing Hip Abduction with Resistance at Ankles and Counter Support - 2 x daily - 7 x weekly - 2 sets - 10 reps Standing Hip Extension with Resistance at Ankles and Counter Support - 2 x daily - 7 x weekly - 2 sets - 10 reps Sit to Stand - 2 x daily - 7 x weekly - 2 sets - 10 reps Supine Hamstring Stretch with Strap - 2 x daily - 7 x weekly - 3 sets - 10 reps - 30 sec hold Supine Bridge with Mini Swiss Ball Between Knees - 2 x daily - 7 x weekly - 2 sets - 10 reps Clamshell with Resistance - 2 x daily - 7 x weekly - 2 sets - 10 reps Seated Ankle Plantarflexion with Resistance - 1 x daily - 7 x weekly - 2 sets - 10 reps - 3 sec hold Seated Ankle Dorsiflexion with Resistance - 1 x daily - 7 x weekly - 2 sets - 10 reps - 3 sec hold Seated Ankle Eversion with Resistance - 1 x daily - 7 x weekly - 2 sets - 10 reps - 3 sec hold Seated Ankle Inversion with Anchored Resistance - 1 x daily - 7 x weekly - 2 sets - 10 reps - 3 sec hold

## 2021-02-07 NOTE — Therapy (Signed)
[facility] [address]  [place], [place], [address] Phone: [phone]   Fax:  [phone]  Physical Therapy Treatment  Patient Details  Name: Jim Carson MRN: [id number] Date of Birth: 27-Jan-1951 Referring Provider (PT): Gaynelle Arabian, MD   Encounter Date: 02/07/2021   PT End of Session - 02/07/21 0802     Visit Number 6    Number of Visits 8    Date for PT Re-Evaluation 02/12/21    Authorization Type Medicare/AARP    PT Start Time 0802    PT Stop Time 0844    PT Time Calculation (min) 42 min    Activity Tolerance Patient tolerated treatment well    Behavior During Therapy [facility] for tasks assessed/performed             Past Medical History:  Diagnosis Date   Abnormal gait    due to parkinson's,  uses cane   Allergic rhinitis    Benign essential hypertension    Chronic constipation    Deviated septum    GERD (gastroesophageal reflux disease)    History of multiple concussions    per pt as teen playing football, no residual   Hypothyroidism    followed by pcp   Left shoulder pain    OA (osteoarthritis)    OSA on CPAP    Parkinson disease [facility])    neurologist-- dr b. scott  _0  Neurology   Vitamin B12 deficiency    Vitamin D deficiency    Wears glasses     Past Surgical History:  Procedure Laterality Date   APPENDECTOMY  07/1964   LAPAROSCOPIC CHOLECYSTECTOMY  01-21-2017   _1 , [place]   POSTERIOR FUSION CERVICAL SPINE  08-15-2011   _2 , [place]   w/ laminectomy and decompression-- C3 -- T1   POSTERIOR LAMINECTOMY / DECOMPRESSION LUMBAR SPINE  07-30-2013   _3 , [place]   bilateral T11 - 12,  L2 --5   REMOVAL LEFT T1 PARASPINOUS MASS  02-12-2016   _4 ,  [place]   desmoid fibromatosis   SHOULDER ARTHROSCOPY WITH ROTATOR CUFF REPAIR Left 12/15/2018   Procedure: SHOULDER ARTHROSCOPY, biceps tenodesis labored Debridement, submacromial decompression;  Surgeon: Sydnee Cabal, MD;   Location: [place];  Service: Orthopedics;  Laterality: Left;  interscalene block   TOTAL HIP ARTHROPLASTY Left 09/18/2017   _5 , [place]    There were no vitals filed for this visit.   Subjective Assessment - 02/07/21 0807     Subjective Pt reports his L hip seems slower to loosen up when he gets moving.    Pertinent History L THA, Parkinson's disease, neck surgery, lumbar surgery    Patient Stated Goals "to get confidence back in my hip"                               [facility] Adult PT Treatment/Exercise - 02/07/21 0802       Exercises   Exercises Knee/Hip      Knee/Hip Exercises: Aerobic   Recumbent Bike L2 x 6 min      Knee/Hip Exercises: Standing   Hip Abduction Right;Left;10 reps;2 sets;Stengthening    Abduction Limitations looped green TB at ankles    Hip Extension Right;Left;10 reps;2 sets;Stengthening    Extension Limitations looped green TB at ankles    Functional Squat 10 reps;3 seconds;2 sets    Functional Squat Limitations counter squat + reen TB hip ABD isometric  Other Standing Knee Exercises B side stepping and fwd/back monster walk with looped green TB at ankles 2 x 10 ft along counter      Ankle Exercises: Seated   Other Seated Ankle Exercises R/L ankle PF with black TB 10 x 3", 2 sets    Other Seated Ankle Exercises R/L ankle DF/IV/EV with red TB 10 x 3"                    PT Education - 02/07/21 0841     Education Details HEP review & update - progression to green TB for hip strengthening + introduction of ankle strengthening - Access Code: [email or web address]    Person(s) Educated Patient    Methods Explanation;Demonstration;Verbal cues;Handout    Comprehension Verbalized understanding;Verbal cues required;Returned demonstration;Need further instruction              PT Short Term Goals - 12/22/20 1349       PT SHORT TERM GOAL #1   Title ---               PT Long Term Goals - 02/05/21 0816        PT LONG TERM GOAL #1   Title Patient will be independent with ongoing/advanced HEP for self-management at home    Status On-going      PT LONG TERM GOAL #2   Title Patient will demonstrate improved L LE strength to >/= 4+/5 for improved stability and ease of mobility    Status Partially Met   met for hip and knee     PT LONG TERM GOAL #3   Title Patient will ambulate with normal gait pattern and good gait stability with rollator or LRAD    Status On-going      PT LONG TERM GOAL #4   Title Patient will experience no further instances of hip dislocation    Status On-going                   Plan - 02/07/21 0846     Clinical Impression Statement [name] reports HEP going well and feels that he is ready to try progressing the resistance level for the theraband exercises - progressed resistance to green TB with good tolerance, therefore green band provided for home use. Pt mentioning ankle instability and unable to complete B heel raises, therefore introduced 4-way theraband ankle strengthening with HEP instructions provided accordingly. Will plan to finalize HEP review next week and anticipate transition to HEP pending goal assessment.    Personal Factors and Comorbidities Comorbidity 3+;Past/Current Experience    Comorbidities L THR 2019, L RTC repair 2020GERD, OA,C-spine fusion, post-laminectory lumbar spine, Parkinson's disease    Rehab Potential Good    PT Frequency 2x / week    PT Duration 4 weeks    PT Treatment/Interventions ADLs/Self Care Home Management;Cryotherapy;Electrical Stimulation;Iontophoresis 63m/ml Dexamethasone;DME Instruction;Gait training;Stair training;Functional mobility training;Therapeutic activities;Therapeutic exercise;Balance training;Neuromuscular re-education;Patient/family education;Manual techniques;Passive range of motion    PT Next Visit Plan LE stretching and strengthening adhereing to hip precautions; dynamic balance exercises    PT Home Exercise  Plan MedBridge Access Code: [email or web address](7/27, updated 8/3 & 8/10)    Consulted and Agree with Plan of Care Patient             Patient will benefit from skilled therapeutic intervention in order to improve the following deficits and impairments:  Abnormal gait, Decreased activity tolerance, Decreased balance, Decreased coordination, Decreased endurance, Decreased knowledge of precautions, Decreased knowledge of use  of DME, Decreased mobility, Decreased range of motion, Decreased safety awareness, Decreased strength, Difficulty walking, Increased edema, Increased muscle spasms, Impaired perceived functional ability, Impaired flexibility, Improper body mechanics, Postural dysfunction, Pain  Visit Diagnosis: Stiffness of left hip, not elsewhere classified  Pain in left hip  Muscle weakness (generalized)  Other abnormalities of gait and mobility     Problem List There are no problems to display for this patient.   Percival Spanish, PT, MPT 02/07/2021, 12:48 PM  [facility] [address]  [place], [place], [address] Phone: [phone]   Fax:  [phone]  Name: Mal Asher MRN: [id number] Date of Birth: 1950/07/10

## 2021-02-12 ENCOUNTER — Encounter: Payer: Medicare Other | Admitting: Physical Therapy

## 2021-02-13 ENCOUNTER — Encounter: Payer: Medicare Other | Admitting: Physical Therapy

## 2021-02-19 ENCOUNTER — Encounter: Payer: Self-pay | Admitting: Physical Therapy

## 2021-02-19 ENCOUNTER — Other Ambulatory Visit: Payer: Self-pay

## 2021-02-19 ENCOUNTER — Ambulatory Visit: Payer: Medicare Other | Admitting: Physical Therapy

## 2021-02-19 DIAGNOSIS — M25552 Pain in left hip: Secondary | ICD-10-CM

## 2021-02-19 DIAGNOSIS — R2689 Other abnormalities of gait and mobility: Secondary | ICD-10-CM

## 2021-02-19 DIAGNOSIS — M25652 Stiffness of left hip, not elsewhere classified: Secondary | ICD-10-CM

## 2021-02-19 DIAGNOSIS — M6281 Muscle weakness (generalized): Secondary | ICD-10-CM

## 2021-02-19 NOTE — Therapy (Signed)
[facility] [address]  [place], [place], [address] Phone: [phone]   Fax:  [phone]  Physical Therapy Treatment / Discharge Summary  Patient Details  Name: Jim Carson MRN: [id number] Date of Birth: October 17, 1950 Referring Provider (PT): Gaynelle Arabian, MD   Encounter Date: 02/19/2021   PT End of Session - 02/19/21 1710     Visit Number 7    Number of Visits 8    Authorization Type Medicare/AARP    PT Start Time 2482    PT Stop Time 5003    PT Time Calculation (min) 38 min    Activity Tolerance Patient tolerated treatment well    Behavior During Therapy [facility] for tasks assessed/performed             Past Medical History:  Diagnosis Date   Abnormal gait    due to parkinson's,  uses cane   Allergic rhinitis    Benign essential hypertension    Chronic constipation    Deviated septum    GERD (gastroesophageal reflux disease)    History of multiple concussions    per pt as teen playing football, no residual   Hypothyroidism    followed by pcp   Left shoulder pain    OA (osteoarthritis)    OSA on CPAP    Parkinson disease [facility])    neurologist-- dr b. Nicki Reaper  _0  Neurology   Vitamin B12 deficiency    Vitamin D deficiency    Wears glasses     Past Surgical History:  Procedure Laterality Date   APPENDECTOMY  07/1964   LAPAROSCOPIC CHOLECYSTECTOMY  01-21-2017   _1 , [place]   POSTERIOR FUSION CERVICAL SPINE  08-15-2011   _2 , [place]   w/ laminectomy and decompression-- C3 -- T1   POSTERIOR LAMINECTOMY / DECOMPRESSION LUMBAR SPINE  07-30-2013   _3 , [place]   bilateral T11 - 12,  L2 --5   REMOVAL LEFT T1 PARASPINOUS MASS  02-12-2016   _4 ,  [place]   desmoid fibromatosis   SHOULDER ARTHROSCOPY WITH ROTATOR CUFF REPAIR Left 12/15/2018   Procedure: SHOULDER ARTHROSCOPY, biceps tenodesis labored Debridement, submacromial decompression;  Surgeon: Sydnee Cabal, MD;  Location: [place];  Service: Orthopedics;  Laterality: Left;  interscalene block   TOTAL HIP ARTHROPLASTY Left 09/18/2017   _5 , [place]    There were no vitals filed for this visit.   Subjective Assessment - 02/19/21 1714     Subjective Pt reports he ended up having to cancel his f/u with the MD on the 01/02/87 due to a conflict but states MD had told him he did not need to come back if everything was doing ok.    Pertinent History L THA, Parkinson's disease, neck surgery, lumbar surgery    Patient Stated Goals "to get confidence back in my hip"    Currently in Pain? No/denies                [facility] PT Assessment - 02/19/21 1710       Assessment   Medical Diagnosis L THA dislocation    Referring Provider (PT) Gaynelle Arabian, MD    Onset Date/Surgical Date 12/27/20    Next MD Visit PRN      Strength   Right Hip Flexion 5/5    Right Hip Extension 4+/5    Right Hip ABduction 5/5    Right Hip ADduction 5/5    Left Hip Flexion 5/5    Left Hip Extension 5/5  Left Hip ABduction 5/5    Left Hip ADduction 5/5    Right Knee Flexion 5/5    Right Knee Extension 5/5    Left Knee Flexion 5/5    Left Knee Extension 5/5    Right Ankle Dorsiflexion 5/5    Right Ankle Plantar Flexion 3+/5   with manual resistance; unable to perform SLS heel raise   Left Ankle Dorsiflexion 5/5    Left Ankle Plantar Flexion 3+/5   with manual resistance; unable to perform SLS heel raise                          [facility] Adult PT Treatment/Exercise - 02/19/21 1710       Ambulation/Gait   Ambulation/Gait Assistance 5: Supervision;6: Modified independent (Device/Increase time)    Ambulation/Gait Assistance Details review of safe use od rollator brakes during transfers as well as reminders for upirght posture and good rollator proximity during gait    Ambulation Distance (Feet) 1000 Feet    Assistive device 4-wheeled walker;Rollator    Gait Pattern Step-through pattern;Decreased step  length - right;Decreased step length - left;Shuffle;Trunk flexed;Narrow base of support;Scissoring;Festinating;Right genu recurvatum    Ambulation Surface Level;Indoor;Outdoor;Unlevel;Paved    Curb 5: Supervision    Curb Details (indicate cue type and reason) cues for proper positioning relative to curb as well as safe use of rollator brakes      Exercises   Exercises Knee/Hip      Knee/Hip Exercises: Aerobic   Nustep L5 x 6 min                      PT Short Term Goals - 12/22/20 1349       PT SHORT TERM GOAL #1   Title ---               PT Long Term Goals - 02/19/21 1721       PT LONG TERM GOAL #1   Title Patient will be independent with ongoing/advanced HEP for self-management at home    Status Achieved      PT LONG TERM GOAL #2   Title Patient will demonstrate improved L LE strength to >/= 4+/5 for improved stability and ease of mobility    Status Achieved   02/19/21 - met except ankle PF     PT LONG TERM GOAL #3   Title Patient will ambulate with normal gait pattern and good gait stability with rollator or LRAD    Status Achieved   02/19/21     PT LONG TERM GOAL #4   Title Patient will experience no further instances of hip dislocation    Status Achieved   02/19/21                  Plan - 02/19/21 1748     Clinical Impression Statement [name] is very pleased with his progress and feels that he is essentially back to his baseline with no further instances of L hip subluxation/dislocation. He is independent with his HEP and reports he has resumed some of his prior exercises and stretches including stretches such as SKTC and DKTC - cautioned pt to continue to be careful with pushing ROM past the hip precautions especially in combination motions. His L LE strength is now 5/5 with the exception of his L ankle PF for which he reports longstanding weakness. He able to ambulate with a normal gait pattern and good stability with a rollator  indoors and  outdoors on multiple surfaces - he has purchased a rollator as recommended but continues to prefer to use the cane. All goals now met. He is ready to transition to his HEP and plans to gradually resume [employer] for his Parkinson's, therefore will proceed with D/C from PT for this episode.    Comorbidities L THR 2019, L RTC repair 2020GERD, OA,C-spine fusion, post-laminectory lumbar spine, Parkinson's disease    Rehab Potential Good    PT Treatment/Interventions ADLs/Self Care Home Management;Cryotherapy;Electrical Stimulation;Iontophoresis 71m/ml Dexamethasone;DME Instruction;Gait training;Stair training;Functional mobility training;Therapeutic activities;Therapeutic exercise;Balance training;Neuromuscular re-education;Patient/family education;Manual techniques;Passive range of motion    PT Next Visit Plan Discharge with transition to [facility]Access Code: [email or web address](7/27, updated 8/3 & 8/10)    Consulted and Agree with Plan of Care Patient             Patient will benefit from skilled therapeutic intervention in order to improve the following deficits and impairments:  Abnormal gait, Decreased activity tolerance, Decreased balance, Decreased coordination, Decreased endurance, Decreased knowledge of precautions, Decreased knowledge of use of DME, Decreased mobility, Decreased range of motion, Decreased safety awareness, Decreased strength, Difficulty walking, Increased edema, Increased muscle spasms, Impaired perceived functional ability, Impaired flexibility, Improper body mechanics, Postural dysfunction, Pain  Visit Diagnosis: Stiffness of left hip, not elsewhere classified  Pain in left hip  Muscle weakness (generalized)  Other abnormalities of gait and mobility     Problem List There are no problems to display for this patient.    PHYSICAL THERAPY DISCHARGE SUMMARY  Visits from Start of Care: 7  Current functional level related to goals  / functional outcomes:   Refer to above clinical impression.   Remaining deficits:   As above.   Education / Equipment:   HEP, hip precautions   Patient agrees to discharge. Patient goals were met. Patient is being discharged due to meeting the stated rehab goals.  JPercival Spanish PT, MPT 02/19/2021, 6:10 PM  [facility] [place] [address]Phone: [phone]  Fax:  [phone] Name: RZoe NordinMRN: [id number] of Birth: [birth year]

## 2021-05-04 NOTE — Progress Notes (Signed)
Office Visit Note  Patient: Jim Carson             Date of Birth: 12/19/1950           MRN: [id number]             PCP: Loraine Leriche., MD Referring: Loraine Leriche.,* Visit Date: 05/18/2021 Occupation: @GUAROCC @  Subjective:  New Patient (Initial Visit) (Right wrist pain and swelling, unable to make a fist bil hands)   History of Present Illness: Jim Carson is a 70 y.o. male seen in consultation per request of his PCP.  According to the patient he has had history of joint pain and discomfort for many years.  He had lower back pain for many years and he developed lumbar spinal stenosis.  He underwent laminectomy in 2015.  He also had cervical spine fusion in 2013.  He started having left hip pain in 2019 and underwent left total hip replacement in March 2019.  He states that recently has been having some discomfort in his right hip.  He had left shoulder joint arthroscopic surgery for rotator cuff tear repair in June 2020.  He played many sports growing up which included football, baseball and soccer.  He intermittently plays golf now.  He also plays guitar.  He states for the last few months he has been having pain and discomfort in his bilateral hands.  He complains of numbness in his  right thumb and index finger for the last 5 years.  He is also noticed prominence of the right [facility] joint.  There is history of osteoarthritis in his father.  Activities of Daily Living:  Patient reports morning stiffness for 1 hour.   Patient Reports nocturnal pain.  Difficulty dressing/grooming: Denies Difficulty climbing stairs: Denies Difficulty getting out of chair: Denies Difficulty using hands for taps, buttons, cutlery, and/or writing: Reports  Review of Systems  Constitutional:  Negative for fatigue.  HENT:  Positive for mouth dryness.   Eyes:  Positive for dryness.  Respiratory:  Negative for shortness of breath.   Cardiovascular:  Positive for swelling in legs/feet.   Gastrointestinal:  Positive for constipation.  Endocrine: Negative for increased urination.  Genitourinary:  Negative for difficulty urinating.  Musculoskeletal:  Positive for joint pain, gait problem, joint pain, joint swelling, muscle weakness and morning stiffness.  Skin:  Negative for color change, rash and sensitivity to sunlight.  Allergic/Immunologic: Negative for susceptible to infections.  Neurological:  Positive for numbness and weakness.  Hematological:  Negative for bruising/bleeding tendency and swollen glands.  Psychiatric/Behavioral:  Negative for depressed mood and sleep disturbance. The patient is not nervous/anxious.    PMFS History:  Patient Active Problem List   Diagnosis Date Noted   Primary osteoarthritis of left hip 05/18/2021   PD (Parkinson's disease) ([place]) 05/18/2021   History of hypothyroidism 05/18/2021   History of gastroesophageal reflux (GERD) 05/18/2021   OSA (obstructive sleep apnea) 05/18/2021   Essential hypertension 05/18/2021     Past Medical History:  Diagnosis Date   Abnormal gait    due to parkinson's,  uses cane   Allergic rhinitis    Benign essential hypertension    Chronic constipation    Deviated septum    GERD (gastroesophageal reflux disease)    History of multiple concussions    per pt as teen playing football, no residual   Hypothyroidism    followed by pcp   Left shoulder pain    OA (osteoarthritis)    OSA on  CPAP    Parkinson disease [facility])    neurologist-- dr b. 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   @[facility] ,  [place]   desmoid fibromatosis   SHOULDER ARTHROSCOPY WITH ROTATOR CUFF REPAIR Left 12/15/2018   Procedure: SHOULDER ARTHROSCOPY, biceps tenodesis labored Debridement, submacromial decompression;  Surgeon: Sydnee Cabal, MD;  Location: [facility];  Service: Orthopedics;  Laterality: Left;  interscalene block   TOTAL HIP ARTHROPLASTY Left 09/18/2017   @[facility] , [place]   Social History   Social History Narrative   Not on file   Immunization History  Administered Date(s) Administered   PFIZER(Purple Top)SARS-COV-2 Vaccination 03/27/2020   Tdap 07/24/2020     Objective: Vital Signs: BP (!) 128/57 (BP Location: Right Arm, Patient Position: Sitting, Cuff Size: Normal)   Pulse (!) 56   Resp 18   Ht 5\' 8"  (5.638 m)   Wt 222 lb (100.7 kg)   BMI 33.75 kg/m    Physical Exam Vitals and nursing note reviewed.  Constitutional:      Appearance: He is well-developed.  HENT:     Head: Normocephalic and atraumatic.  Eyes:     Conjunctiva/sclera: Conjunctivae normal.     Pupils: Pupils are equal, round, and reactive to light.  Cardiovascular:     Rate and Rhythm: Normal rate and regular rhythm.     Heart sounds: Normal heart sounds.  Pulmonary:     Effort: Pulmonary effort is normal.     Breath sounds: Normal breath sounds.  Abdominal:     General: Bowel sounds are normal.     Palpations: Abdomen is soft.  Musculoskeletal:     Cervical back: Normal range of motion and neck supple.  Skin:    General: Skin is warm and dry.     Capillary Refill: Capillary refill takes less than 2 seconds.  Neurological:     Mental Status: He is alert and oriented to person, place, and time.  Psychiatric:        Behavior: Behavior normal.     Musculoskeletal Exam: He had limited range of motion of the cervical spine due to cervical spine fusion.  Limited range of  motion of the lumbar spine.  He had spastic gait due to Parkinson's.  Shoulder joints with good range of motion.  He had limited extension of bilateral elbows which she relates to Parkinson's.  He had bilateral CMC PIP and DIP thickening with no synovitis.  Hip joints had limited range of motion.  Left hip joint is replaced.  Knee joints are in good range of motion.  He had bilateral PIP and DIP thickening in his feet and bilateral pes planus.  CDAI Exam: CDAI Score: -- Patient Global: --; Provider Global: -- Swollen: --; Tender: -- Joint Exam 05/18/2021   No joint exam has been documented for this visit   There is currently no  information documented on the homunculus. Go to the Rheumatology activity and complete the homunculus joint exam.  Investigation: No additional findings.  Imaging: No results found.  Recent Labs: Lab Results  Component Value Date   WBC 5.8 12/15/2018   HGB 13.2 12/15/2018   PLT 142 (L) 12/15/2018   NA 140 12/15/2018   K 3.7 12/15/2018   CL 103 12/15/2018   CO2 25 12/15/2018   GLUCOSE 90 12/15/2018   BUN 19 12/15/2018   CREATININE 0.83 12/15/2018   CALCIUM 9.2 12/15/2018   GFRAA >60 12/15/2018    Speciality Comments: No specialty comments available.  Procedures:  No procedures performed Allergies: Other   Assessment / Plan:     Visit Diagnoses: Pain in both hands -he complains of pain and discomfort in his bilateral hands which she describes over the left [facility] joint and bilateral hands.  He has difficulty making a fist.  Detailed counseling osteoarthritis was provided.  I will obtain x-rays and labs today.  I gave him a handout on hand exercises.  I also offered physical therapy and Occupational Therapy which he declined.  He plays guitar and the arthritis in his hands interferes with playing guitar.  Plan: XR Hand 2 View Right, XR Hand 2 View Left, x-rays of the bilateral hands were consistent with osteoarthritis.  Sedimentation rate, Rheumatoid  factor, Uric acid, Cyclic citrul peptide antibody, IgG   DDD (degenerative disc disease), cervical - Status post fusion 2013.  He had limited range of motion of his cervical spine.  He also gives history of numbness in his right thumb and right finger for many years.  DDD (degenerative disc disease), lumbar - Status post discectomy for spinal stenosis in 2015.  He gives history of residual numbness in his right foot.  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  He had limited range of motion without discomfort.  Keratoconjunctivitis sicca of both eyes (HCC)-relates to medications.  PD (Parkinson's disease) ([place]) -he is followed at [facility] neurology  Essential hypertension-his blood pressure was normal today.  History of hypothyroidism  History of gastroesophageal reflux (GERD)  OSA (obstructive sleep apnea) - uses CPAP  Vitamin D deficiency  Vitamin B12 deficiency  Orders: Orders Placed This Encounter  Procedures   XR Hand 2 View Right   XR Hand 2 View Left   Sedimentation rate   Rheumatoid factor   Uric acid   Cyclic citrul peptide antibody, IgG    No orders of the defined types were placed in this encounter.    Follow-Up Instructions: Return for Osteoarthritis.   Bo Merino, MD  Note - This record has been created using Editor, commissioning.  Chart creation errors have been sought, but may not always  have been located. Such creation errors do not reflect on  the standard of medical care.

## 2021-05-18 ENCOUNTER — Ambulatory Visit (INDEPENDENT_AMBULATORY_CARE_PROVIDER_SITE_OTHER): Payer: Medicare Other | Admitting: Rheumatology

## 2021-05-18 ENCOUNTER — Ambulatory Visit: Payer: Self-pay

## 2021-05-18 ENCOUNTER — Encounter: Payer: Self-pay | Admitting: Rheumatology

## 2021-05-18 ENCOUNTER — Other Ambulatory Visit: Payer: Self-pay

## 2021-05-18 VITALS — BP 128/57 | HR 56 | Resp 18 | Ht 68.0 in | Wt 222.0 lb

## 2021-05-18 DIAGNOSIS — Z96642 Presence of left artificial hip joint: Secondary | ICD-10-CM

## 2021-05-18 DIAGNOSIS — M1612 Unilateral primary osteoarthritis, left hip: Secondary | ICD-10-CM | POA: Insufficient documentation

## 2021-05-18 DIAGNOSIS — M79641 Pain in right hand: Secondary | ICD-10-CM

## 2021-05-18 DIAGNOSIS — E559 Vitamin D deficiency, unspecified: Secondary | ICD-10-CM

## 2021-05-18 DIAGNOSIS — M3501 Sicca syndrome with keratoconjunctivitis: Secondary | ICD-10-CM

## 2021-05-18 DIAGNOSIS — M5136 Other intervertebral disc degeneration, lumbar region: Secondary | ICD-10-CM | POA: Diagnosis not present

## 2021-05-18 DIAGNOSIS — M79642 Pain in left hand: Secondary | ICD-10-CM

## 2021-05-18 DIAGNOSIS — G20A1 Parkinson's disease without dyskinesia, without mention of fluctuations: Secondary | ICD-10-CM

## 2021-05-18 DIAGNOSIS — Z8639 Personal history of other endocrine, nutritional and metabolic disease: Secondary | ICD-10-CM | POA: Insufficient documentation

## 2021-05-18 DIAGNOSIS — M5030 Other cervical disc degeneration, unspecified cervical region: Secondary | ICD-10-CM

## 2021-05-18 DIAGNOSIS — I10 Essential (primary) hypertension: Secondary | ICD-10-CM

## 2021-05-18 DIAGNOSIS — G20 Parkinson's disease: Secondary | ICD-10-CM

## 2021-05-18 DIAGNOSIS — G4733 Obstructive sleep apnea (adult) (pediatric): Secondary | ICD-10-CM

## 2021-05-18 DIAGNOSIS — E538 Deficiency of other specified B group vitamins: Secondary | ICD-10-CM

## 2021-05-18 DIAGNOSIS — Z8719 Personal history of other diseases of the digestive system: Secondary | ICD-10-CM

## 2021-05-18 DIAGNOSIS — M25539 Pain in unspecified wrist: Secondary | ICD-10-CM

## 2021-05-18 DIAGNOSIS — M5416 Radiculopathy, lumbar region: Secondary | ICD-10-CM

## 2021-05-18 NOTE — Patient Instructions (Signed)
Hand Exercises Hand exercises can be helpful for almost anyone. These exercises can strengthen the hands, improve flexibility and movement, and increase blood flow to the hands. These results can make work and daily tasks easier. Hand exercises can be especially helpful for people who have joint pain from arthritis or have nerve damage from overuse (carpal tunnel syndrome). These exercises can also help people who have injured a hand. Exercises Most of these hand exercises are gentle stretching and motion exercises. It is usually safe to do them often throughout the day. Warming up your hands before exercise may help to reduce stiffness. You can do this with gentle massage or by placing your hands in warm water for 10-15 minutes. It is normal to feel some stretching, pulling, tightness, or mild discomfort as you begin new exercises. This will gradually improve. Stop an exercise right away if you feel sudden, severe pain or your pain gets worse. Ask your health care provider which exercises are best for you. Knuckle bend or "claw" fist  Stand or sit with your arm, hand, and all five fingers pointed straight up. Make sure to keep your wrist straight during the exercise. Gently bend your fingers down toward your palm until the tips of your fingers are touching the top of your palm. Keep your big knuckle straight and just bend the small knuckles in your fingers. Hold this position for __________ seconds. Straighten (extend) your fingers back to the starting position. Repeat this exercise 5-10 times with each hand. Full finger fist  Stand or sit with your arm, hand, and all five fingers pointed straight up. Make sure to keep your wrist straight during the exercise. Gently bend your fingers into your palm until the tips of your fingers are touching the middle of your palm. Hold this position for __________ seconds. Extend your fingers back to the starting position, stretching every joint fully. Repeat  this exercise 5-10 times with each hand. Straight fist Stand or sit with your arm, hand, and all five fingers pointed straight up. Make sure to keep your wrist straight during the exercise. Gently bend your fingers at the big knuckle, where your fingers meet your hand, and the middle knuckle. Keep the knuckle at the tips of your fingers straight and try to touch the bottom of your palm. Hold this position for __________ seconds. Extend your fingers back to the starting position, stretching every joint fully. Repeat this exercise 5-10 times with each hand. Tabletop  Stand or sit with your arm, hand, and all five fingers pointed straight up. Make sure to keep your wrist straight during the exercise. Gently bend your fingers at the big knuckle, where your fingers meet your hand, as far down as you can while keeping the small knuckles in your fingers straight. Think of forming a tabletop with your fingers. Hold this position for __________ seconds. Extend your fingers back to the starting position, stretching every joint fully. Repeat this exercise 5-10 times with each hand. Finger spread  Place your hand flat on a table with your palm facing down. Make sure your wrist stays straight as you do this exercise. Spread your fingers and thumb apart from each other as far as you can until you feel a gentle stretch. Hold this position for __________ seconds. Bring your fingers and thumb tight together again. Hold this position for __________ seconds. Repeat this exercise 5-10 times with each hand. Making circles  Stand or sit with your arm, hand, and all five fingers pointed   straight up. Make sure to keep your wrist straight during the exercise. Make a circle by touching the tip of your thumb to the tip of your index finger. Hold for __________ seconds. Then open your hand wide. Repeat this motion with your thumb and each finger on your hand. Repeat this exercise 5-10 times with each hand. Thumb  motion  Sit with your forearm resting on a table and your wrist straight. Your thumb should be facing up toward the ceiling. Keep your fingers relaxed as you move your thumb. Lift your thumb up as high as you can toward the ceiling. Hold for __________ seconds. Bend your thumb across your palm as far as you can, reaching the tip of your thumb for the small finger (pinkie) side of your palm. Hold for __________ seconds. Repeat this exercise 5-10 times with each hand. Grip strengthening  Hold a stress ball or other soft ball in the middle of your hand. Slowly increase the pressure, squeezing the ball as much as you can without causing pain. Think of bringing the tips of your fingers into the middle of your palm. All of your finger joints should bend when doing this exercise. Hold your squeeze for __________ seconds, then relax. Repeat this exercise 5-10 times with each hand. Contact a health care provider if: Your hand pain or discomfort gets much worse when you do an exercise. Your hand pain or discomfort does not improve within 2 hours after you exercise. If you have any of these problems, stop doing these exercises right away. Do not do them again unless your health care provider says that you can. Get help right away if: You develop sudden, severe hand pain or swelling. If this happens, stop doing these exercises right away. Do not do them again unless your health care provider says that you can. This information is not intended to replace advice given to you by your health care provider. Make sure you discuss any questions you have with your health care provider. Document Revised: 10/05/2020 Document Reviewed: 10/05/2020 Elsevier Patient Education  2022 [employer]  

## 2021-05-20 IMAGING — RF SWALLOWING FUNCTION
13 of 17 series · 13 of 24 positions shown · non-contrast
Comparison: None

CLINICAL DATA: Dysphagia. Hitory of cervical spine surgery and
Parkinson Disease

EXAM:
MODIFIED BARIUM SWALLOW
TECHNIQUE: Different consistencies of barium were administered orally to the
patient by the Speech Pathologist. Imaging of the pharynx was
performed in the lateral projection. The radiologist was present in
the fluoroscopy room for this study, providing personal supervision.
FLUOROSCOPY TIME:  Fluoroscopy Time:  1 minutes 36 seconds
Radiation Exposure Index (if provided by the fluoroscopic device):
10.4 mGy
Number of Acquired Spot Images: multiple fluoroscopic screen
captures

[Series 1: before po · 1 of 50 frames shown (1 of 2)]
[frame 8/50]
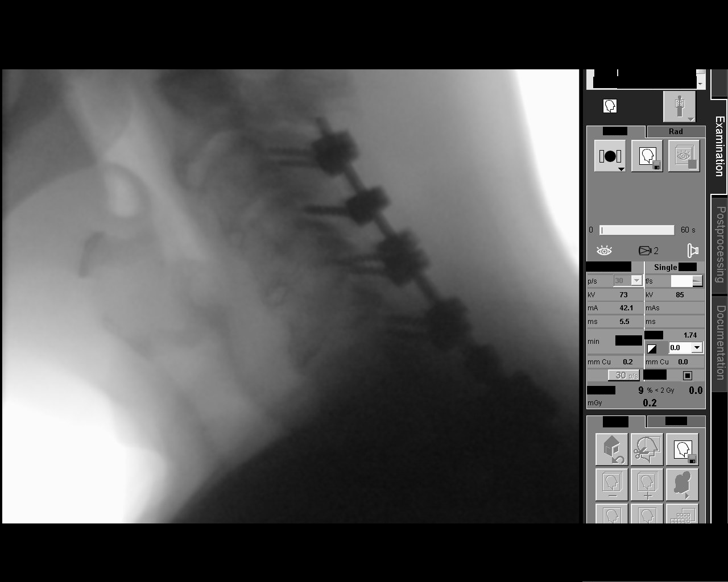

[Series 2: before po · 1 of 57 frames shown (2 of 2)]
[frame 44/57]
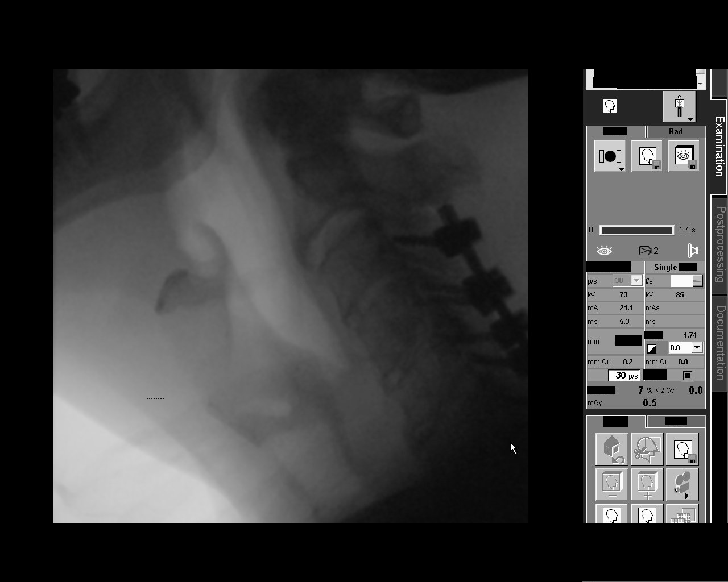

[Series 3: tsp thin · 1 of 155 frames shown]
[frame 132/155]
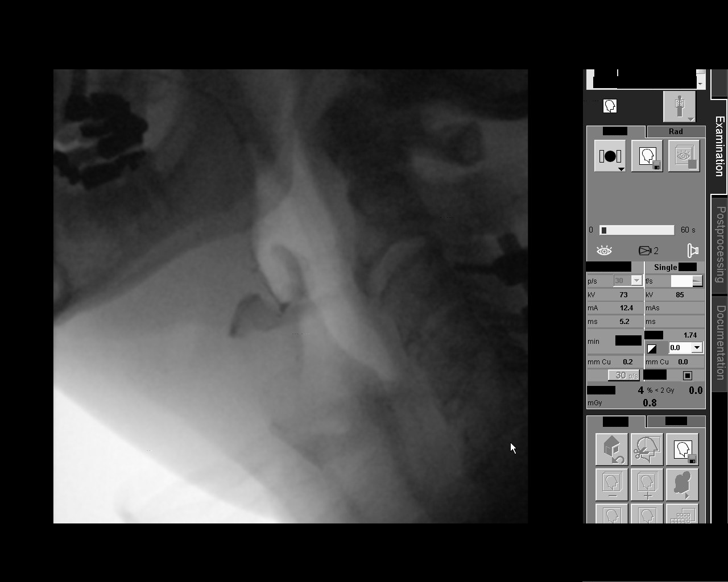

[Series 5: cup thin · 1 of 115 frames shown (1 of 3)]
[frame 58/115]
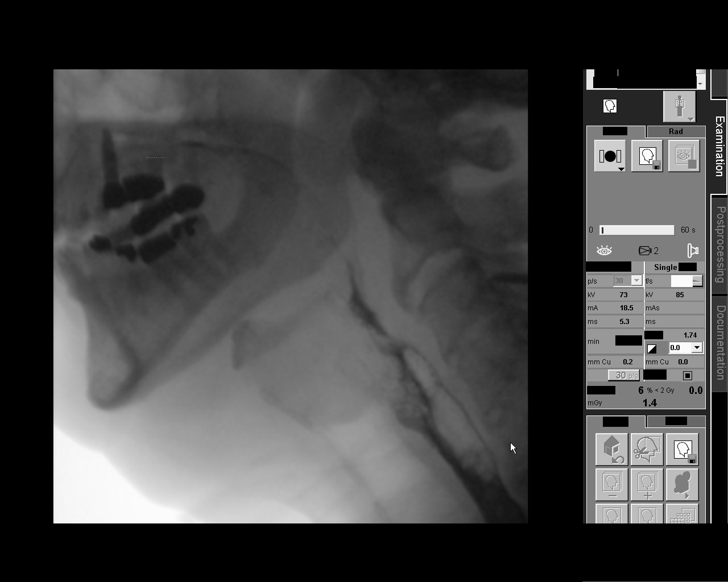

[Series 6: cup thin · 1 of 122 frames shown (2 of 3)]
[frame 104/122]
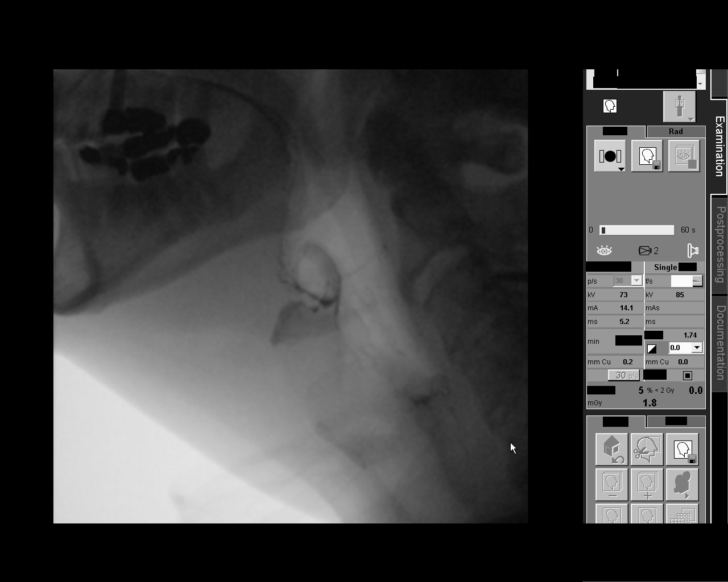

[Series 8: missed puree 1 · 1 of 203 frames shown]
[frame 102/203]
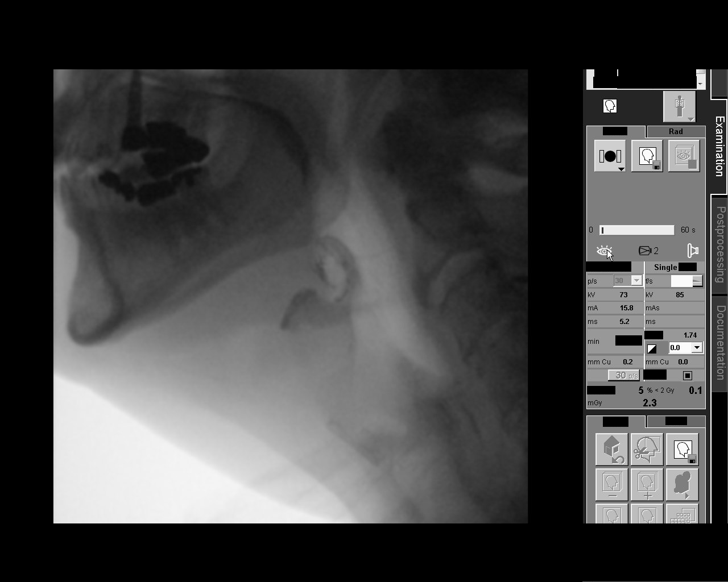

[Series 9: puree 2 · 1 of 105 frames shown]
[frame 53/105]
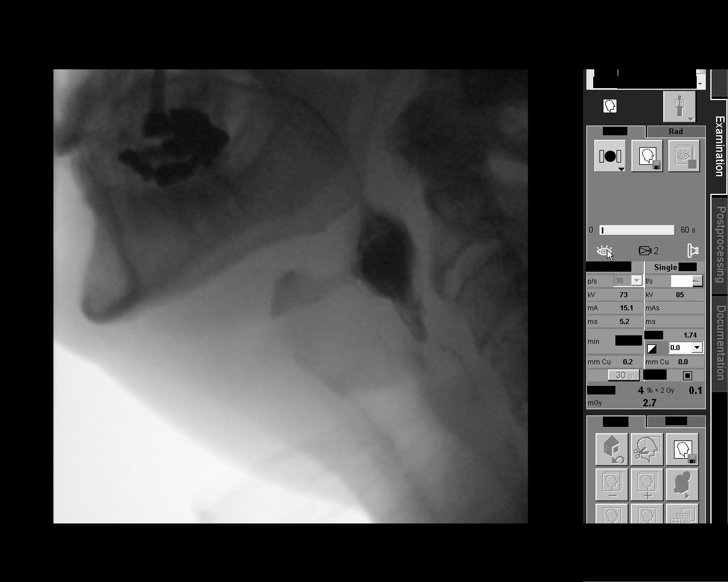

[Series 10: puree 3 · 1 of 86 frames shown]
[frame 44/86]
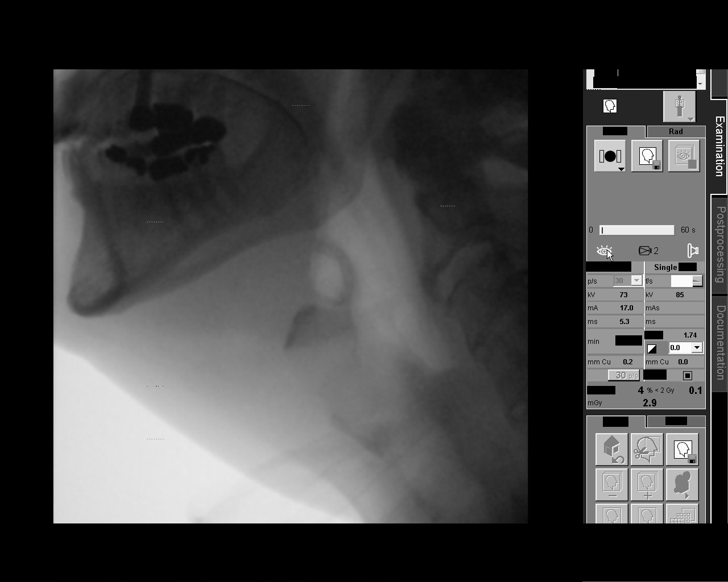

[Series 12: regular 2 · 1 of 319 frames shown]
[frame 43/319]
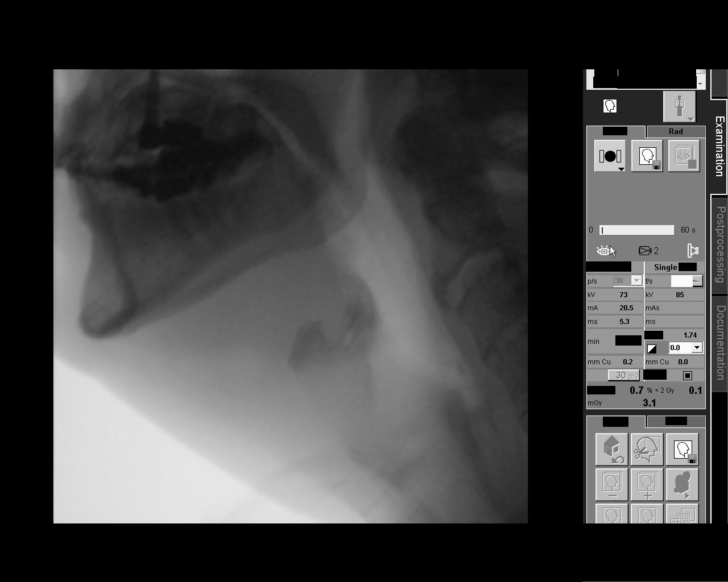

[Series 13: cup thin · 1 of 193 frames shown (3 of 3)]
[frame 165/193]
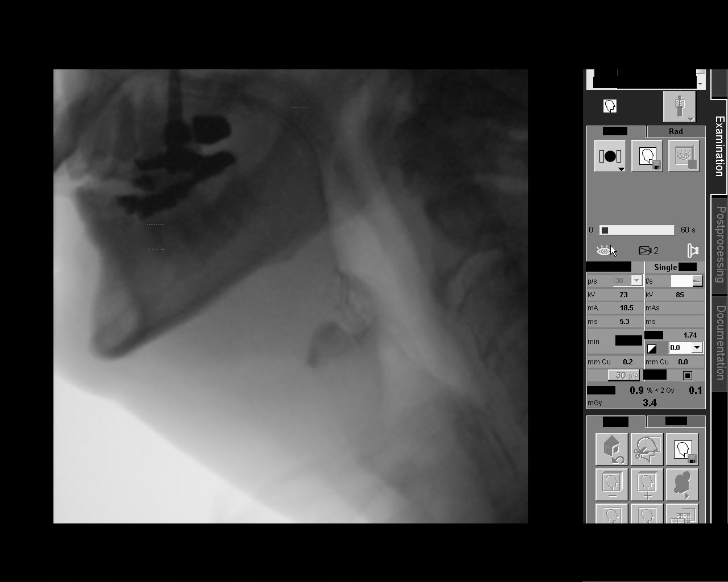

[Series 15: sweep · 1 of 100 frames shown (1 of 2)]
[frame 16/100]
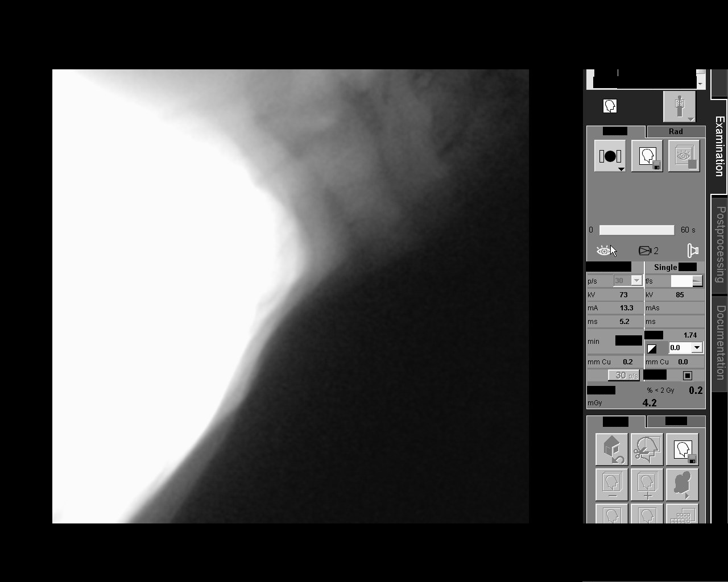

[Series 16: sweep · 1 of 232 frames shown (2 of 2)]
[frame 48/232]
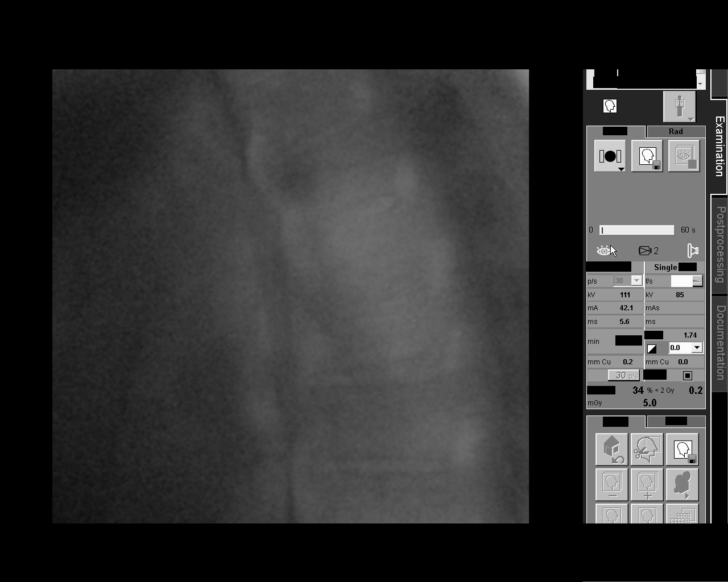

[Series 17: straw sips thin · 1 of 389 frames shown]
[frame 331/389]
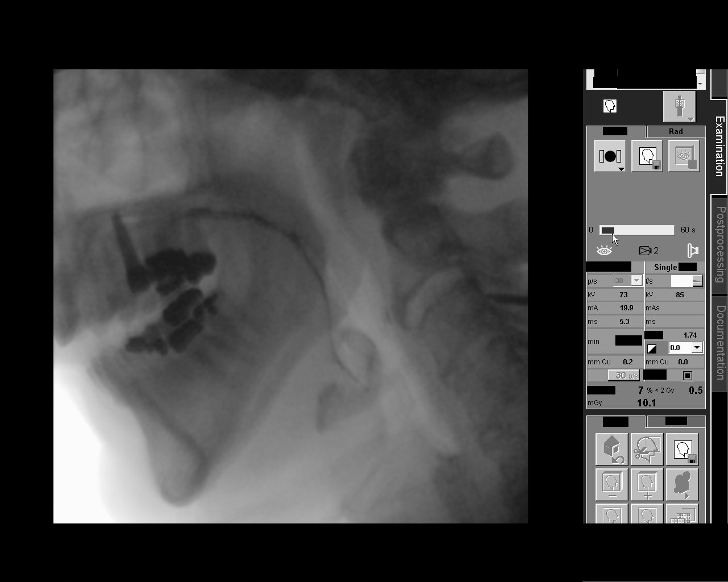

[13 of 24 positions shown; findings below may reference images not displayed]

FINDINGS: Flash laryngeal penetration was seen with a single swallow of thin
barium by cup presentation.

Remaining thin barium by teaspoon, cup and straw presentations
showed no laryngeal penetration or aspiration. Minimal vallecular
residuals with cracker consistency. Applesauce and cracker
consistencies were otherwise normally manipulated. No aspiration
with any consistency. Patient swallowed a 12.5 mm diameter barium
tablet with thin barium without difficulty; tablet passed to stomach
without obstruction.
IMPRESSION: Single episode of flash laryngeal penetration without aspiration.

Minimal vallecular residuals of cracker consistency.

Please refer to the Speech Pathologists report for complete details
and recommendations.

## 2021-05-21 LAB — SEDIMENTATION RATE: Sed Rate: 9 mm/h (ref 0–20)

## 2021-05-21 LAB — RHEUMATOID FACTOR: Rheumatoid fact SerPl-aCnc: <14 IU/mL (ref <14)

## 2021-05-21 LAB — CYCLIC CITRUL PEPTIDE ANTIBODY, IGG: Cyclic Citrullin Peptide Ab: <16 UNITS

## 2021-05-21 LAB — URIC ACID: Uric Acid, Serum: 6.2 mg/dL (ref 4.0–8.0)

## 2021-06-10 NOTE — Progress Notes (Signed)
Office Visit Note  Patient: Jim Carson             Date of Birth: August 16, 1950           MRN: [id number]             PCP: Loraine Leriche., MD Referring: Loraine Leriche.,* Visit Date: 06/13/2021 Occupation: _0 @  Subjective:  Pain and stiffness in multiple joints.   History of Present Illness: Jim Carson is a 70 y.o. male with a history of osteoarthritis and degenerative disc disease.  He states he continues to have a stiffness in his bilateral hands.  His unable to make a complete fist and do the activities he used to enjoy.  He also has some stiffness in his neck and lower back.  He attempts have nocturnal pain in his lower back.  It is hard for him to find a comfortable position due to lower back discomfort.  He denies any radiculopathy.  Activities of Daily Living:  Patient reports morning stiffness for 30 minutes.   Patient Reports nocturnal pain.  Difficulty dressing/grooming: Denies Difficulty climbing stairs: Reports Difficulty getting out of chair: Reports Difficulty using hands for taps, buttons, cutlery, and/or writing: Reports  Review of Systems  Constitutional:  Positive for fatigue.  HENT:  Positive for mouth dryness and nose dryness. Negative for mouth sores.   Eyes:  Positive for dryness. Negative for pain and itching.  Respiratory:  Negative for shortness of breath and difficulty breathing.   Cardiovascular:  Negative for chest pain and palpitations.  Gastrointestinal:  Positive for constipation. Negative for blood in stool and diarrhea.  Endocrine: Positive for increased urination.  Genitourinary:  Negative for difficulty urinating.  Musculoskeletal:  Positive for joint pain, joint pain, joint swelling and morning stiffness. Negative for myalgias, muscle tenderness and myalgias.  Skin:  Negative for color change, rash and redness.  Allergic/Immunologic: Negative for susceptible to infections.  Neurological:  Positive for numbness and  parasthesias. Negative for dizziness, headaches and memory loss.  Hematological:  Negative for bruising/bleeding tendency.  Psychiatric/Behavioral:  Negative for confusion.    PMFS History:  Patient Active Problem List   Diagnosis Date Noted   Primary osteoarthritis of left hip 05/18/2021   PD (Parkinson's disease) ([place]) 05/18/2021   History of hypothyroidism 05/18/2021   History of gastroesophageal reflux (GERD) 05/18/2021   OSA (obstructive sleep apnea) 05/18/2021   Essential hypertension 05/18/2021    Past Medical History:  Diagnosis Date   Abnormal gait    due to parkinson's,  uses cane   Allergic rhinitis    Benign essential hypertension    Chronic constipation    Deviated septum    GERD (gastroesophageal reflux disease)    History of multiple concussions    per pt as teen playing football, no residual   Hypothyroidism    followed by pcp   Left shoulder pain    OA (osteoarthritis)    OSA on CPAP    Parkinson disease [facility])    neurologist-- dr b. scott  _1  Neurology   Vitamin B12 deficiency    Vitamin D deficiency    Wears glasses     Family History  Problem Relation Age of Onset   Prostate cancer Brother    Past Surgical History:  Procedure Laterality Date   APPENDECTOMY  07/1964   LAPAROSCOPIC CHOLECYSTECTOMY  01-21-2017   _2 , [place] CERVICAL SPINE  08-15-2011   _3 , [place]   w/ laminectomy and decompression-- C3 --  T1   POSTERIOR LAMINECTOMY / DECOMPRESSION LUMBAR SPINE  07-30-2013   _0 , [place]   bilateral T11 - 12,  L2 --5   REMOVAL LEFT T1 PARASPINOUS MASS  02-12-2016   _1 ,  [place]   desmoid fibromatosis   SHOULDER ARTHROSCOPY WITH ROTATOR CUFF REPAIR Left 12/15/2018   Procedure: SHOULDER ARTHROSCOPY, biceps tenodesis labored Debridement, submacromial decompression;  Surgeon: Sydnee Cabal, MD;  Location: [facility];  Service: Orthopedics;  Laterality: Left;  interscalene block   TOTAL HIP ARTHROPLASTY  Left 09/18/2017   _2 , [place] [facility]   Social History   Social History Narrative   Not on file   Immunization History  Administered Date(s) Administered   PFIZER Comirnaty(Gray Top)Covid-19 Tri-Sucrose Vaccine 07/29/2019, 08/24/2019, 10/14/2020   PFIZER(Purple Top)SARS-COV-2 Vaccination 07/29/2019, 08/24/2019, 03/27/2020   Tdap 07/24/2020     Objective: Vital Signs: BP 106/63 (BP Location: Left Arm, Patient Position: Sitting, Cuff Size: Large)   Pulse (!) 56   Ht _3  (1.727 m)   Wt 221 lb (100.2 kg)   BMI 33.60 kg/m    Physical Exam Vitals and nursing note reviewed.  Constitutional:      Appearance: He is well-developed.  HENT:     Head: Normocephalic and atraumatic.  Eyes:     Conjunctiva/sclera: Conjunctivae normal.     Pupils: Pupils are equal, round, and reactive to light.  Cardiovascular:     Rate and Rhythm: Normal rate and regular rhythm.     Heart sounds: Normal heart sounds.  Pulmonary:     Effort: Pulmonary effort is normal.     Breath sounds: Normal breath sounds.  Abdominal:     General: Bowel sounds are normal.     Palpations: Abdomen is soft.  Musculoskeletal:     Cervical back: Normal range of motion and neck supple.  Skin:    General: Skin is warm and dry.     Capillary Refill: Capillary refill takes less than 2 seconds.  Neurological:     Mental Status: He is alert and oriented to person, place, and time.  Psychiatric:        Behavior: Behavior normal.     Musculoskeletal Exam: He had limited rotation, flexion and extension of the cervical spine.  He had significant thoracic kyphosis.  He had limited painful range of motion of the lumbar spine.  Shoulder joints and elbow joints with good range of motion.  He had bilateral PIP and DIP thickening with no synovitis.  He had incomplete fist formation.  Hip joints were not limited range of motion.  Left hip joint is replaced.  Knee joints with good range of motion without any warmth swelling or effusion.   He had no tenderness over ankles or MTPs.  CDAI Exam: CDAI Score: -- Patient Global: --; Provider Global: -- Swollen: --; Tender: -- Joint Exam 06/13/2021   No joint exam has been documented for this visit   There is currently no information documented on the homunculus. Go to the Rheumatology activity and complete the homunculus joint exam.  Investigation: No additional findings.  Imaging: XR Hand 2 View Left  Result Date: 05/18/2021 Severe CMC narrowing was noted.  PIP and DIP narrowing was noted.  No MCP, intercarpal or radiocarpal joint space narrowing was noted.  No erosive changes were noted. Impression: These findings are consistent with osteoarthritis of the hand.  XR Hand 2 View Right  Result Date: 05/18/2021 Severe CMC narrowing was noted.  PIP and DIP narrowing was noted.  No MCP,  intercarpal or radiocarpal joint space narrowing was noted.  No erosive changes were noted. Impression: These findings are consistent with osteoarthritis of the hand.   Recent Labs: Lab Results  Component Value Date   WBC 5.8 12/15/2018   HGB 13.2 12/15/2018   PLT 142 (L) 12/15/2018   NA 140 12/15/2018   K 3.7 12/15/2018   CL 103 12/15/2018   CO2 25 12/15/2018   GLUCOSE 90 12/15/2018   BUN 19 12/15/2018   CREATININE 0.83 12/15/2018   CALCIUM 9.2 12/15/2018   GFRAA >60 12/15/2018   May 18, 2021 ESR 9, RF negative, anti-CCP negative, uric acid 6.2   Speciality Comments: No specialty comments available.  Procedures:  No procedures performed Allergies: Other   Assessment / Plan:     Visit Diagnoses: Primary osteoarthritis of both hands - Clinical and radiographic findings were consistent with osteoarthritis.  Detailed underlying osteoarthritis was provided.  X-ray findings were discussed with patient and his wife today.  All autoimmune work-up negative.  Lab results were discussed.  PT and OT declined by the patient.  He will contact us in case he wants to go for occupational  therapy.  He goes to a Physiological [employer].  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  He had limited range of motion of the left hip joint.  DDD (degenerative disc disease), cervical - Status post fusion 2013.  History of right thumb and index finger numbness for many years.  He had limited range of motion.  Neck exercises were demonstrated in the office today.  A handout on exercises was given.  DDD (degenerative disc disease), lumbar - Status post discectomy in 2015 for spinal stenosis.  History of right lower extremity residual numbness.  He continues to have lower back pain and discomfort.  He declined physical therapy referral.  I gave him a handout on back exercises.  Some of the back exercises were demonstrated in the office.  I also gave him a prescription for TENS unit to electrodes some.  Keratoconjunctivitis sicca of both eyes ([place]) - Relates to medication use.  PD (Parkinson's disease) ([place]) - Followed by [facility] neurology.  Essential hypertension  History of gastroesophageal reflux (GERD)  History of hypothyroidism  Vitamin D deficiency  Vitamin B12 deficiency  OSA (obstructive sleep apnea)  Orders: No orders of the defined types were placed in this encounter.  No orders of the defined types were placed in this encounter.   Follow-Up Instructions: Return if symptoms worsen or fail to improve, for Osteoarthritis.   Bo Merino, MD  Note - This record has been created using Editor, commissioning.  Chart creation errors have been sought, but may not always  have been located. Such creation errors do not reflect on  the standard of medical care.

## 2021-06-13 ENCOUNTER — Encounter: Payer: Self-pay | Admitting: Rheumatology

## 2021-06-13 ENCOUNTER — Ambulatory Visit (INDEPENDENT_AMBULATORY_CARE_PROVIDER_SITE_OTHER): Payer: Medicare Other | Admitting: Rheumatology

## 2021-06-13 ENCOUNTER — Other Ambulatory Visit: Payer: Self-pay

## 2021-06-13 VITALS — BP 106/63 | HR 56 | Ht 68.0 in | Wt 221.0 lb

## 2021-06-13 DIAGNOSIS — G20A1 Parkinson's disease without dyskinesia, without mention of fluctuations: Secondary | ICD-10-CM

## 2021-06-13 DIAGNOSIS — I10 Essential (primary) hypertension: Secondary | ICD-10-CM

## 2021-06-13 DIAGNOSIS — M19041 Primary osteoarthritis, right hand: Secondary | ICD-10-CM

## 2021-06-13 DIAGNOSIS — M51369 Other intervertebral disc degeneration, lumbar region without mention of lumbar back pain or lower extremity pain: Secondary | ICD-10-CM

## 2021-06-13 DIAGNOSIS — G20 Parkinson's disease: Secondary | ICD-10-CM

## 2021-06-13 DIAGNOSIS — G4733 Obstructive sleep apnea (adult) (pediatric): Secondary | ICD-10-CM

## 2021-06-13 DIAGNOSIS — M5030 Other cervical disc degeneration, unspecified cervical region: Secondary | ICD-10-CM | POA: Diagnosis not present

## 2021-06-13 DIAGNOSIS — E538 Deficiency of other specified B group vitamins: Secondary | ICD-10-CM

## 2021-06-13 DIAGNOSIS — Z8639 Personal history of other endocrine, nutritional and metabolic disease: Secondary | ICD-10-CM

## 2021-06-13 DIAGNOSIS — M3501 Sicca syndrome with keratoconjunctivitis: Secondary | ICD-10-CM

## 2021-06-13 DIAGNOSIS — M19042 Primary osteoarthritis, left hand: Secondary | ICD-10-CM

## 2021-06-13 DIAGNOSIS — E559 Vitamin D deficiency, unspecified: Secondary | ICD-10-CM

## 2021-06-13 DIAGNOSIS — M5136 Other intervertebral disc degeneration, lumbar region: Secondary | ICD-10-CM

## 2021-06-13 DIAGNOSIS — Z96642 Presence of left artificial hip joint: Secondary | ICD-10-CM | POA: Diagnosis not present

## 2021-06-13 DIAGNOSIS — H16223 Keratoconjunctivitis sicca, not specified as Sjogren's, bilateral: Secondary | ICD-10-CM

## 2021-06-13 DIAGNOSIS — Z8719 Personal history of other diseases of the digestive system: Secondary | ICD-10-CM

## 2021-06-13 NOTE — Patient Instructions (Addendum)
Back Exercises The following exercises strengthen the muscles that help to support the trunk (torso) and back. They also help to keep the lower back flexible. Doing these exercises can help to prevent or lessen existing low back pain. If you have back pain or discomfort, try doing these exercises 2-3 times each day or as told by your health care provider. As your pain improves, do them once each day, but increase the number of times that you repeat the steps for each exercise (do more repetitions). To prevent the recurrence of back pain, continue to do these exercises once each day or as told by your health care provider. Do exercises exactly as told by your health care provider and adjust them as directed. It is normal to feel mild stretching, pulling, tightness, or discomfort as you do these exercises, but you should stop right away if you feel sudden pain or your pain gets worse. Exercises Single knee to chest Repeat these steps 3-5 times for each leg: Lie on your back on a firm bed or the floor with your legs extended. Bring one knee to your chest. Your other leg should stay extended and in contact with the floor. Hold your knee in place by grabbing your knee or thigh with both hands and hold. Pull on your knee until you feel a gentle stretch in your lower back or buttocks. Hold the stretch for 10-30 seconds. Slowly release and straighten your leg.  Pelvic tilt Repeat these steps 5-10 times: Lie on your back on a firm bed or the floor with your legs extended. Bend your knees so they are pointing toward the ceiling and your feet are flat on the floor. Tighten your lower abdominal muscles to press your lower back against the floor. This motion will tilt your pelvis so your tailbone points up toward the ceiling instead of pointing to your feet or the floor. With gentle tension and even breathing, hold this position for 5-10 seconds.  Cat-cow Repeat these steps until your lower back becomes  more flexible: Get into a hands-and-knees position on a firm bed or the floor. Keep your hands under your shoulders, and keep your knees under your hips. You may place padding under your knees for comfort. Let your head hang down toward your chest. Contract your abdominal muscles and point your tailbone toward the floor so your lower back becomes rounded like the back of a cat. Hold this position for 5 seconds. Slowly lift your head, let your abdominal muscles relax, and point your tailbone up toward the ceiling so your back forms a sagging arch like the back of a cow. Hold this position for 5 seconds.  Press-ups Repeat these steps 5-10 times: Lie on your abdomen (face-down) on a firm bed or the floor. Place your palms near your head, about shoulder-width apart. Keeping your back as relaxed as possible and keeping your hips on the floor, slowly straighten your arms to raise the top half of your body and lift your shoulders. Do not use your back muscles to raise your upper torso. You may adjust the placement of your hands to make yourself more comfortable. Hold this position for 5 seconds while you keep your back relaxed. Slowly return to lying flat on the floor.  Bridges Repeat these steps 10 times: Lie on your back on a firm bed or the floor. Bend your knees so they are pointing toward the ceiling and your feet are flat on the floor. Your arms should be flat  at your sides, next to your body. Tighten your buttocks muscles and lift your buttocks off the floor until your waist is at almost the same height as your knees. You should feel the muscles working in your buttocks and the back of your thighs. If you do not feel these muscles, slide your feet 1-2 inches (2.5-5 cm) farther away from your buttocks. Hold this position for 3-5 seconds. Slowly lower your hips to the starting position, and allow your buttocks muscles to relax completely. If this exercise is too easy, try doing it with your arms  crossed over your chest. Abdominal crunches Repeat these steps 5-10 times: Lie on your back on a firm bed or the floor with your legs extended. Bend your knees so they are pointing toward the ceiling and your feet are flat on the floor. Cross your arms over your chest. Tip your chin slightly toward your chest without bending your neck. Tighten your abdominal muscles and slowly raise your torso high enough to lift your shoulder blades a tiny bit off the floor. Avoid raising your torso higher than that because it can put too much stress on your lower back and does not help to strengthen your abdominal muscles. Slowly return to your starting position.  Back lifts Repeat these steps 5-10 times: Lie on your abdomen (face-down) with your arms at your sides, and rest your forehead on the floor. Tighten the muscles in your legs and your buttocks. Slowly lift your chest off the floor while you keep your hips pressed to the floor. Keep the back of your head in line with the curve in your back. Your eyes should be looking at the floor. Hold this position for 3-5 seconds. Slowly return to your starting position.  Contact a health care provider if: Your back pain or discomfort gets much worse when you do an exercise. Your worsening back pain or discomfort does not lessen within 2 hours after you exercise. If you have any of these problems, stop doing these exercises right away. Do not do them again unless your health care provider says that you can. Get help right away if: You develop sudden, severe back pain. If this happens, stop doing the exercises right away. Do not do them again unless your health care provider says that you can. This information is not intended to replace advice given to you by your health care provider. Make sure you discuss any questions you have with your health care provider. Cervical Strain and Sprain Rehab Ask your health care provider which exercises are safe for you. Do  exercises exactly as told by your health care provider and adjust them as directed. It is normal to feel mild stretching, pulling, tightness, or discomfort as you do these exercises. Stop right away if you feel sudden pain or your pain gets worse. Do not begin these exercises until told by your health care provider. Stretching and range-of-motion exercises Cervical side bending  Using good posture, sit on a stable chair or stand up. Without moving your shoulders, slowly tilt your left / right ear to your shoulder until you feel a stretch in the opposite side neck muscles. You should be looking straight ahead. Hold for __________ seconds. Repeat with the other side of your neck. Repeat __________ times. Complete this exercise __________ times a day. Cervical rotation  Using good posture, sit on a stable chair or stand up. Slowly turn your head to the side as if you are looking over your left /  right shoulder. Keep your eyes level with the ground. Stop when you feel a stretch along the side and the back of your neck. Hold for __________ seconds. Repeat this by turning to your other side. Repeat __________ times. Complete this exercise __________ times a day. Thoracic extension and pectoral stretch Roll a towel or a small blanket so it is about 4 inches (10 cm) in diameter. Lie down on your back on a firm surface. Put the towel lengthwise, under your spine in the middle of your back. It should not be under your shoulder blades. The towel should line up with your spine from your middle back to your lower back. Put your hands behind your head and let your elbows fall out to your sides. Hold for __________ seconds. Repeat __________ times. Complete this exercise __________ times a day. Strengthening exercises Isometric upper cervical flexion Lie on your back with a thin pillow behind your head and a small rolled-up towel under your neck. Gently tuck your chin toward your chest and nod your head  down to look toward your feet. Do not lift your head off the pillow. Hold for __________ seconds. Release the tension slowly. Relax your neck muscles completely before you repeat this exercise. Repeat __________ times. Complete this exercise __________ times a day. Isometric cervical extension  Stand about 6 inches (15 cm) away from a wall, with your back facing the wall. Place a soft object, about 6-8 inches (15-20 cm) in diameter, between the back of your head and the wall. A soft object could be a small pillow, a ball, or a folded towel. Gently tilt your head back and press into the soft object. Keep your jaw and forehead relaxed. Hold for __________ seconds. Release the tension slowly. Relax your neck muscles completely before you repeat this exercise. Repeat __________ times. Complete this exercise __________ times a day. Posture and body mechanics Body mechanics refers to the movements and positions of your body while you do your daily activities. Posture is part of body mechanics. Good posture and healthy body mechanics can help to relieve stress in your body's tissues and joints. Good posture means that your spine is in its natural S-curve position (your spine is neutral), your shoulders are pulled back slightly, and your head is not tipped forward. The following are general guidelines for applying improved posture and body mechanics to your everyday activities. Sitting  When sitting, keep your spine neutral and keep your feet flat on the floor. Use a footrest, if necessary, and keep your thighs parallel to the floor. Avoid rounding your shoulders, and avoid tilting your head forward. When working at a desk or a computer, keep your desk at a height where your hands are slightly lower than your elbows. Slide your chair under your desk so you are close enough to maintain good posture. When working at a computer, place your monitor at a height where you are looking straight ahead and you do  not have to tilt your head forward or downward to look at the screen. Standing  When standing, keep your spine neutral and keep your feet about hip-width apart. Keep a slight bend in your knees. Your ears, shoulders, and hips should line up. When you do a task in which you stand in one place for a long time, place one foot up on a stable object that is 2-4 inches (5-10 cm) high, such as a footstool. This helps keep your spine neutral. Resting When lying down and resting, avoid  positions that are most painful for you. Try to support your neck in a neutral position. You can use a contour pillow or a small rolled-up towel. Your pillow should support your neck but not push on it. This information is not intended to replace advice given to you by your health care provider. Make sure you discuss any questions you have with your health care provider. Document Revised: 10/07/2018 Document Reviewed: 03/18/2018 [employer] Patient Education  [place].

## 2021-07-12 ENCOUNTER — Ambulatory Visit: Payer: Medicare Other | Admitting: Occupational Therapy

## 2021-07-12 ENCOUNTER — Ambulatory Visit: Payer: Medicare Other | Admitting: Physical Therapy

## 2021-07-12 ENCOUNTER — Ambulatory Visit: Payer: Medicare Other

## 2021-08-09 ENCOUNTER — Ambulatory Visit: Payer: Medicare Other | Admitting: Physical Therapy

## 2021-08-09 ENCOUNTER — Ambulatory Visit: Payer: Medicare Other | Admitting: Occupational Therapy

## 2021-08-09 ENCOUNTER — Ambulatory Visit: Payer: Medicare Other

## 2021-08-20 NOTE — Progress Notes (Signed)
Covid test on 09/06/21.   Come thru main entrance at [facility] long.  Have a seat in the lobby on ht eright as you come thru the door.  Call [phone] and let them know you are here for covid testing.                 Your procedure is scheduled on:    Report  09/10/21. to [facility] Main  Entrance   Report to admitting at   1215pm    Call this number if you have problems the morning of surgery [phone]    REMEMBER: NO  SOLID FOOD CANDY OR GUM AFTER MIDNIGHT. CLEAR LIQUIDS UNTIL    1200 noon        . NOTHING BY MOUTH EXCEPT CLEAR LIQUIDS UNTIL   33825 noon  . PLEASE FINISH ENSURE DRINK PER SURGEON ORDER  WHICH NEEDS TO BE COMPLETED AT    1200 noon   .      CLEAR LIQUID DIET   Foods Allowed                                                                    Coffee and tea, regular and decaf                            Fruit ices (not with fruit pulp)                                      Iced Popsicles                                    Carbonated beverages, regular and diet                                    Cranberry, grape and apple juices Sports drinks like Gatorade Lightly seasoned clear broth or consume(fat free) Sugar, honey syrup ___________________________________________________________________      BRUSH YOUR TEETH MORNING OF SURGERY AND RINSE YOUR MOUTH OUT, NO CHEWING GUM CANDY OR MINTS.     Take these medicines the morning of surgery with A SIP OF WATER:  sinemet, synthroid   DO NOT TAKE ANY DIABETIC MEDICATIONS DAY OF YOUR SURGERY                               You may not have any metal on your body including hair pins and              piercings  Do not wear jewelry, make-up, lotions, powders or perfumes, deodorant             Do not wear nail polish on your fingernails.  Do not shave  48 hours prior to surgery.              Men may shave face and neck.   Do not bring valuables to the hospital. [place] IS NOT  RESPONSIBLE   FOR  VALUABLES.  Contacts, dentures or bridgework may not be worn into surgery.  Leave suitcase in the car. After surgery it may be brought to your room.     Patients discharged the day of surgery will not be allowed to drive home. IF YOU ARE HAVING SURGERY AND GOING HOME THE SAME DAY, YOU MUST HAVE AN ADULT TO DRIVE YOU HOME AND BE WITH YOU FOR 24 HOURS. YOU MAY GO HOME BY TAXI OR UBER OR ORTHERWISE, BUT AN ADULT MUST ACCOMPANY YOU HOME AND STAY WITH YOU FOR 24 HOURS.  Name and phone number of your driver:  Special Instructions: N/A              Please read over the following fact sheets you were given: _____________________________________________________________________  [facility] - Preparing for Surgery Before surgery, you can play an important role.  Because skin is not sterile, your skin needs to be as free of germs as possible.  You can reduce the number of germs on your skin by washing with CHG (chlorahexidine gluconate) soap before surgery.  CHG is an antiseptic cleaner which kills germs and bonds with the skin to continue killing germs even after washing. Please DO NOT use if you have an allergy to CHG or antibacterial soaps.  If your skin becomes reddened/irritated stop using the CHG and inform your nurse when you arrive at Short Stay. Do not shave (including legs and underarms) for at least 48 hours prior to the first CHG shower.  You may shave your face/neck. Please follow these instructions carefully:  1.  Shower with CHG Soap the night before surgery and the  morning of Surgery.  2.  If you choose to wash your hair, wash your hair first as usual with your  normal  shampoo.  3.  After you shampoo, rinse your hair and body thoroughly to remove the  shampoo.                           4.  Use CHG as you would any other liquid soap.  You can apply chg directly  to the skin and wash                       Gently with a scrungie or clean washcloth.  5.  Apply the CHG Soap to your body ONLY  FROM THE NECK DOWN.   Do not use on face/ open                           Wound or open sores. Avoid contact with eyes, ears mouth and genitals (private parts).                       Wash face,  Genitals (private parts) with your normal soap.             6.  Wash thoroughly, paying special attention to the area where your surgery  will be performed.  7.  Thoroughly rinse your body with warm water from the neck down.  8.  DO NOT shower/wash with your normal soap after using and rinsing off  the CHG Soap.                9.  Pat yourself dry with a clean towel.            10.  Wear clean pajamas.            11.  Place clean sheets on your bed the night of your first shower and do not  sleep with pets. Day of Surgery : Do not apply any lotions/deodorants the morning of surgery.  Please wear clean clothes to the hospital/surgery center.  FAILURE TO FOLLOW THESE INSTRUCTIONS MAY RESULT IN THE CANCELLATION OF YOUR SURGERY PATIENT SIGNATURE_________________________________  NURSE SIGNATURE__________________________________  ________________________________________________________________________

## 2021-08-20 NOTE — Progress Notes (Addendum)
Anesthesia Review:  PCP: DR Tiana Loft in [facility] per pt  To see for clearance on 08/23/21 per pt  Clearance dated 08/24/21 on chart.  Cardiologist : none  Neuro- 07/25/21- Lavena Bullion - LOV  Chest x-ray : EKG :08/22/21  Echo : Stress test: Cardiac Cath :  Activity level: can do a flight of stairs without difficulty  Sleep Study/ CPAP : has cpap  Fasting Blood Sugar :      / Checks Blood Sugar -- times a day:   Blood Thinner/ Instructions /Last Dose: ASA / Instructions/ Last Dose :   81 mg aspirin  Cvoid test on 09/06/21  at 0845am  12 lead ekg- heart rate 50.  Pulse 53. .  PT voices no complaints.  PT states " I normally run about 52.  "   Last ekg in epic- 2020- heart rate- 59.  PT has Parkinson's uses rollator.  PT states recent Parkinson's med added.  PT to call nurse back to give her name of med.   PT called back after preop appt and stated new med was Mirapex.  Added to med list.  Instructed pt to take am of surgery since he takes in the am.  PT voiced understanding.

## 2021-08-22 ENCOUNTER — Encounter (HOSPITAL_COMMUNITY): Payer: Self-pay

## 2021-08-22 ENCOUNTER — Encounter (HOSPITAL_COMMUNITY)
Admission: RE | Admit: 2021-08-22 | Discharge: 2021-08-22 | Disposition: A | Discharge status: Home / Self Care | Payer: Medicare Other | Source: Ambulatory Visit | Attending: Orthopedic Surgery | Admitting: Orthopedic Surgery

## 2021-08-22 ENCOUNTER — Other Ambulatory Visit: Payer: Self-pay

## 2021-08-22 ENCOUNTER — Other Ambulatory Visit (HOSPITAL_COMMUNITY): Payer: Self-pay

## 2021-08-22 VITALS — BP 125/58 | HR 53 | Temp 98.0°F | Resp 16 | Ht 68.0 in | Wt 215.0 lb

## 2021-08-22 DIAGNOSIS — Z20822 Contact with and (suspected) exposure to covid-19: Secondary | ICD-10-CM | POA: Diagnosis not present

## 2021-08-22 DIAGNOSIS — M1612 Unilateral primary osteoarthritis, left hip: Secondary | ICD-10-CM | POA: Insufficient documentation

## 2021-08-22 DIAGNOSIS — Z01818 Encounter for other preprocedural examination: Secondary | ICD-10-CM | POA: Insufficient documentation

## 2021-08-22 HISTORY — DX: Pneumonia, unspecified organism: J18.9

## 2021-08-22 HISTORY — DX: Malignant (primary) neoplasm, unspecified: C80.1

## 2021-08-22 LAB — COMPREHENSIVE METABOLIC PANEL
ALT: 11 U/L (ref 0–44)
AST: 16 U/L (ref 15–41)
Albumin: 4.4 g/dL (ref 3.5–5.0)
Alkaline Phosphatase: 58 U/L (ref 38–126)
Anion gap: 5 (ref 5–15)
BUN: 15 mg/dL (ref 8–23)
CO2: 31 mmol/L (ref 22–32)
Calcium: 9.6 mg/dL (ref 8.9–10.3)
Chloride: 103 mmol/L (ref 98–111)
Creatinine, Ser: 0.97 mg/dL (ref 0.61–1.24)
GFR, Estimated: >60 mL/min (ref >60)
Glucose, Bld: 101 mg/dL — ABNORMAL HIGH (ref 70–99)
Potassium: 4.3 mmol/L (ref 3.5–5.1)
Sodium: 139 mmol/L (ref 135–145)
Total Bilirubin: 0.5 mg/dL (ref 0.3–1.2)
Total Protein: 7.4 g/dL (ref 6.5–8.1)

## 2021-08-22 LAB — SURGICAL PCR SCREEN
MRSA, PCR: NEGATIVE
Staphylococcus aureus: NEGATIVE

## 2021-08-22 LAB — CBC
HCT: 41.6 % (ref 39.0–52.0)
Hemoglobin: 13.6 g/dL (ref 13.0–17.0)
MCH: 29.5 pg (ref 26.0–34.0)
MCHC: 32.7 g/dL (ref 30.0–36.0)
MCV: 90.2 fL (ref 80.0–100.0)
Platelets: 162 10*3/uL (ref 150–400)
RBC: 4.61 MIL/uL (ref 4.22–5.81)
RDW: 13.2 % (ref 11.5–15.5)
WBC: 6 10*3/uL (ref 4.0–10.5)
nRBC: 0 % (ref 0.0–0.2)

## 2021-08-22 LAB — PROTIME-INR
INR: 1 (ref 0.8–1.2)
Prothrombin Time: 13.2 seconds (ref 11.4–15.2)

## 2021-08-28 NOTE — H&P (Signed)
ADMISSION H&P  Subjective:  HPI: Jim Carson, 71 y.o. male, presents for pre-operative visit in preparation for their Left Hip acetabular VS left total hip arthroplasty revision, which is scheduled on 09/10/2021 with Dr. Wynelle Link at [facility]. The patient has had symptoms in the left hip including pain which has impacted their quality of life and ability to do activities of daily living. The patient currently has a diagnosis of recurrent dislocation, left hip and has failed conservative treatments including activity modification. The patient has had previous left total hip arthroplasty on the left hip. The patient denies an active infection.  Patient Active Problem List   Diagnosis Date Noted   Primary osteoarthritis of left hip 05/18/2021   PD (Parkinson's disease) ([place]) 05/18/2021   History of hypothyroidism 05/18/2021   History of gastroesophageal reflux (GERD) 05/18/2021   OSA (obstructive sleep apnea) 05/18/2021   Essential hypertension 05/18/2021    Past Medical History:  Diagnosis Date   Abnormal gait    due to parkinson's,  uses cane   Allergic rhinitis    Benign essential hypertension    Cancer (HCC)    hx of skin cancer   Chronic constipation    Deviated septum    GERD (gastroesophageal reflux disease)    History of multiple concussions    per pt as teen playing football, no residual   Hypothyroidism    followed by pcp   Left shoulder pain    OA (osteoarthritis)    OSA on CPAP    Parkinson disease [facility])    neurologist-- dr b. Nicki Reaper  @[facility]  Neurology   Pneumonia    Vitamin B12 deficiency    Vitamin D deficiency    Wears glasses     Past Surgical History:  Procedure Laterality Date   APPENDECTOMY  07/1964   LAPAROSCOPIC CHOLECYSTECTOMY  01-21-2017   @[facility] , [place]   POSTERIOR FUSION CERVICAL SPINE  08-15-2011   @[facility] , [place]   w/ laminectomy and decompression-- C3 -- T1   POSTERIOR LAMINECTOMY / DECOMPRESSION LUMBAR SPINE  07-30-2013   @[facility] , [place]    bilateral T11 - 12,  L2 --5   REMOVAL LEFT T1 PARASPINOUS MASS  02-12-2016   @[facility] ,  [place]   desmoid fibromatosis   SHOULDER ARTHROSCOPY WITH ROTATOR CUFF REPAIR Left 12/15/2018   Procedure: SHOULDER ARTHROSCOPY, biceps tenodesis labored Debridement, submacromial decompression;  Surgeon: Sydnee Cabal, MD;  Location: [place];  Service: Orthopedics;  Laterality: Left;  interscalene block   TOTAL HIP ARTHROPLASTY Left 09/18/2017   @[facility] , [place]    Prior to Admission medications   Medication Sig Start Date End Date Taking? Authorizing Provider  aspirin EC 81 MG tablet Take 81 mg by mouth every 6 (six) hours as needed for moderate pain.   Yes [provider]  carbidopa-levodopa (SINEMET IR) 25-100 MG tablet Take 3.5 tablets by mouth 3 (three) times daily.   Yes [provider]  Carbidopa-Levodopa ER (SINEMET CR) 25-100 MG tablet controlled release Take 1 tablet by mouth at bedtime.    Yes [provider]  Cholecalciferol (VITAMIN D3) 125 MCG (5000 UT) CAPS Take 5,000 Units by mouth in the morning.   Yes [provider]  desonide (DESOWEN) 0.05 % ointment Apply 1 application topically daily as needed (irritation). 08/18/19  Yes [provider]  diclofenac sodium (VOLTAREN) 1 % GEL Apply 2 g topically daily as needed (pain).   Yes [provider]  docusate sodium (COLACE) 100 MG capsule Take 100 mg by  mouth daily as needed for moderate constipation.   Yes [provider]  hydrochlorothiazide (HYDRODIURIL) 25 MG tablet Take 25 mg by mouth in the morning. 02/28/19  Yes [provider]  ipratropium (ATROVENT) 0.06 % nasal spray Place 2 sprays into both nostrils 2 (two) times daily as needed for rhinitis.   Yes [provider]  levothyroxine (SYNTHROID, LEVOTHROID) 75 MCG tablet Take 75 mcg by mouth daily before breakfast.   Yes [provider]  linaclotide (LINZESS) 290 MCG CAPS capsule Take 290  mcg by mouth daily as needed (constipation).   Yes [provider]  losartan (COZAAR) 100 MG tablet Take 100 mg by mouth in the morning. 04/27/21  Yes [provider]  ondansetron (ZOFRAN-ODT) 4 MG disintegrating tablet Take 4 mg by mouth every 8 (eight) hours as needed for nausea or vomiting.   Yes [provider]  rasagiline (AZILECT) 1 MG TABS tablet Take 1 mg by mouth in the morning. 12/13/20  Yes [provider]  vitamin B-12 (CYANOCOBALAMIN) 1000 MCG tablet Take 1,000 mcg by mouth daily.   Yes [provider]  pramipexole (MIRAPEX) 0.5 MG tablet Take 0.5 mg by mouth in the morning and at bedtime.    [provider]    Allergies  Allergen Reactions   Other Itching and Rash    Other reaction(s): Other (See Comments) Dog hair, roaches, dust mites: very slight reaction: causes runny nose Dog hair, roaches, dust mites: very slight reaction: causes runny nose Dog hair, roaches, dust mites: very slight reaction: causes runny nose Dog hair, roaches, dust mites: very slight reaction: causes runny nose     Social History   Socioeconomic History   Marital status: Married    Spouse name: Not on file   Number of children: Not on file   Years of education: Not on file   Highest education level: Not on file  Occupational History   Not on file  Tobacco Use   Smoking status: Never   Smokeless tobacco: Never  Vaping Use   Vaping Use: Never used  Substance and Sexual Activity   Alcohol use: Yes    Alcohol/week: 7.0 standard drinks    Types: 7 Glasses of wine per week    Comment: glass at dinner at nite   Drug use: Never   Sexual activity: Not on file  Other Topics Concern   Not on file  Social History Narrative   Not on file   Social Determinants of Health   Financial Resource Strain: Not on file  Food Insecurity: Not on file  Transportation Needs: Not on file  Physical Activity: Not on file  Stress: Not on file  Social  Connections: Not on file  Intimate Partner Violence: Not on file    Tobacco Use: Low Risk    Smoking Tobacco Use: Never   Smokeless Tobacco Use: Never   Passive Exposure: Not on file   Social History   Substance and Sexual Activity  Alcohol Use Yes   Alcohol/week: 7.0 standard drinks   Types: 7 Glasses of wine per week   Comment: glass at dinner at nite    Family History  Problem Relation Age of Onset   Prostate cancer Brother     Review of Systems  Constitutional:  Negative for chills and fever.  HENT:  Negative for congestion, sore throat and tinnitus.   Eyes:  Negative for double vision, photophobia and pain.  Respiratory:  Negative for cough, shortness of breath and  wheezing.   Cardiovascular:  Negative for chest pain, palpitations and orthopnea.  Gastrointestinal:  Negative for heartburn, nausea and vomiting.  Genitourinary:  Negative for dysuria, frequency and urgency.  Musculoskeletal:  Positive for joint pain.  Neurological:  Negative for dizziness, weakness and headaches.    Objective:  Physical Exam: Well nourished and well developed.  General: Alert and oriented x3, cooperative and pleasant, no acute distress.  Head: normocephalic, atraumatic, neck supple.  Eyes: EOMI.  Musculoskeletal:  Left Hip Exam:   Range of motion: Flexion to 100 degrees, internal rotation to 20 degrees, external rotation to 20 degrees, and abduction to 30 degrees with slight discomfort.  Calves soft and nontender. Motor function intact in LE. Strength 5/5 LE bilaterally. Neuro: Distal pulses 2+. Sensation to light touch intact in LE  Imaging Review Radiographs - AP pelvis and lateral of the left hip dated 08/10/2021 demonstrate a pinnacle acetabular component in place in good position. He appears to have a Summit stem also in good position. The hip is concentrically reduced  Assessment/Plan:  Recurrent dislocation, left total hip arthroplasty  The risks and benefits of total  hip arthroplasty revision were presented and reviewed. The risks due to aseptic loosening, infection, stiffness, dislocation/subluxation, thromboembolic complications and other imponderables were discussed. The patient acknowledged the explanation, agreed to proceed with the plan and consent was signed. Patient is being admitted for inpatient treatment for surgery, pain control, PT, OT, prophylactic antibiotics, VTE prophylaxis, progressive ambulation and ADLs and discharge planning.The patient is planning to be discharged  home .  Therapy Plans: HHPT vs HEP Disposition: Home with wife Planned DVT Prophylaxis: Aspirin 325 mg BID DME Needed: None PCP: Tiana Loft, MD (clearance received) TXA: IV Allergies: NKDA Anesthesia Concerns: None BMI: 33 Last HgbA1c: Not diabetic.  [facility]: [facility] ([place])   - Patient was instructed on what medications to stop prior to surgery. - Follow-up visit in 2 weeks with Dr. Wynelle Link - Begin physical therapy following surgery - Pre-operative lab work as pre-surgical testing - Prescriptions will be provided in hospital at time of discharge  Theresa Duty, PA-C Orthopedic Surgery [facility] [place]

## 2021-09-06 ENCOUNTER — Encounter (HOSPITAL_COMMUNITY)
Admission: RE | Admit: 2021-09-06 | Discharge: 2021-09-06 | Disposition: A | Discharge status: Home / Self Care | Payer: Medicare Other | Source: Ambulatory Visit | Attending: Orthopedic Surgery | Admitting: Orthopedic Surgery

## 2021-09-06 ENCOUNTER — Other Ambulatory Visit: Payer: Self-pay

## 2021-09-06 DIAGNOSIS — Z01812 Encounter for preprocedural laboratory examination: Secondary | ICD-10-CM | POA: Insufficient documentation

## 2021-09-06 DIAGNOSIS — Z20822 Contact with and (suspected) exposure to covid-19: Secondary | ICD-10-CM | POA: Insufficient documentation

## 2021-09-06 LAB — SARS CORONAVIRUS 2 (TAT 6-24 HRS): SARS Coronavirus 2: NEGATIVE

## 2021-09-10 ENCOUNTER — Other Ambulatory Visit: Payer: Self-pay

## 2021-09-10 ENCOUNTER — Encounter (HOSPITAL_COMMUNITY): Payer: Self-pay | Admitting: Orthopedic Surgery

## 2021-09-10 ENCOUNTER — Inpatient Hospital Stay (HOSPITAL_COMMUNITY): Payer: Medicare Other

## 2021-09-10 ENCOUNTER — Inpatient Hospital Stay (HOSPITAL_COMMUNITY): Payer: Medicare Other | Admitting: Physician Assistant

## 2021-09-10 ENCOUNTER — Encounter (HOSPITAL_COMMUNITY)
Admission: RE | Disposition: A | Discharge status: Home / Self Care | Payer: Self-pay | Source: Home / Self Care | Attending: Orthopedic Surgery

## 2021-09-10 ENCOUNTER — Inpatient Hospital Stay (HOSPITAL_COMMUNITY)
Admission: RE | Admit: 2021-09-10 | Discharge: 2021-09-11 | DRG: 468 | Disposition: A | Discharge status: Home / Self Care | Payer: Medicare Other | Attending: Orthopedic Surgery | Admitting: Orthopedic Surgery

## 2021-09-10 ENCOUNTER — Inpatient Hospital Stay (HOSPITAL_COMMUNITY): Payer: Medicare Other | Admitting: Certified Registered"

## 2021-09-10 DIAGNOSIS — M1612 Unilateral primary osteoarthritis, left hip: Secondary | ICD-10-CM | POA: Diagnosis present

## 2021-09-10 DIAGNOSIS — Y792 Prosthetic and other implants, materials and accessory orthopedic devices associated with adverse incidents: Secondary | ICD-10-CM | POA: Diagnosis present

## 2021-09-10 DIAGNOSIS — I10 Essential (primary) hypertension: Secondary | ICD-10-CM | POA: Diagnosis present

## 2021-09-10 DIAGNOSIS — Z6832 Body mass index (BMI) 32.0-32.9, adult: Secondary | ICD-10-CM

## 2021-09-10 DIAGNOSIS — Z87820 Personal history of traumatic brain injury: Secondary | ICD-10-CM

## 2021-09-10 DIAGNOSIS — G20 Parkinson's disease: Secondary | ICD-10-CM | POA: Diagnosis present

## 2021-09-10 DIAGNOSIS — E538 Deficiency of other specified B group vitamins: Secondary | ICD-10-CM | POA: Diagnosis present

## 2021-09-10 DIAGNOSIS — E039 Hypothyroidism, unspecified: Secondary | ICD-10-CM | POA: Diagnosis present

## 2021-09-10 DIAGNOSIS — K219 Gastro-esophageal reflux disease without esophagitis: Secondary | ICD-10-CM | POA: Diagnosis present

## 2021-09-10 DIAGNOSIS — Z9989 Dependence on other enabling machines and devices: Secondary | ICD-10-CM

## 2021-09-10 DIAGNOSIS — Z9109 Other allergy status, other than to drugs and biological substances: Secondary | ICD-10-CM

## 2021-09-10 DIAGNOSIS — G4733 Obstructive sleep apnea (adult) (pediatric): Secondary | ICD-10-CM

## 2021-09-10 DIAGNOSIS — M24452 Recurrent dislocation, left hip: Secondary | ICD-10-CM | POA: Diagnosis present

## 2021-09-10 DIAGNOSIS — E559 Vitamin D deficiency, unspecified: Secondary | ICD-10-CM | POA: Diagnosis present

## 2021-09-10 DIAGNOSIS — T84028A Dislocation of other internal joint prosthesis, initial encounter: Secondary | ICD-10-CM

## 2021-09-10 DIAGNOSIS — Z79890 Hormone replacement therapy (postmenopausal): Secondary | ICD-10-CM | POA: Diagnosis not present

## 2021-09-10 DIAGNOSIS — T84021A Dislocation of internal left hip prosthesis, initial encounter: Secondary | ICD-10-CM | POA: Diagnosis present

## 2021-09-10 DIAGNOSIS — Z01818 Encounter for other preprocedural examination: Secondary | ICD-10-CM

## 2021-09-10 DIAGNOSIS — Z85828 Personal history of other malignant neoplasm of skin: Secondary | ICD-10-CM

## 2021-09-10 DIAGNOSIS — Z8701 Personal history of pneumonia (recurrent): Secondary | ICD-10-CM | POA: Diagnosis not present

## 2021-09-10 DIAGNOSIS — E669 Obesity, unspecified: Secondary | ICD-10-CM | POA: Diagnosis present

## 2021-09-10 DIAGNOSIS — Z79899 Other long term (current) drug therapy: Secondary | ICD-10-CM

## 2021-09-10 DIAGNOSIS — K5909 Other constipation: Secondary | ICD-10-CM | POA: Diagnosis present

## 2021-09-10 DIAGNOSIS — Z96649 Presence of unspecified artificial hip joint: Secondary | ICD-10-CM

## 2021-09-10 DIAGNOSIS — M25552 Pain in left hip: Secondary | ICD-10-CM | POA: Diagnosis present

## 2021-09-10 HISTORY — PX: TOTAL HIP REVISION: SHX763

## 2021-09-10 HISTORY — DX: Dislocation of other internal joint prosthesis, initial encounter: T84.028A

## 2021-09-10 LAB — TYPE AND SCREEN
ABO/RH(D): O POS
Antibody Screen: NEGATIVE

## 2021-09-10 LAB — ABO/RH: ABO/RH(D): O POS

## 2021-09-10 SURGERY — TOTAL HIP REVISION
Anesthesia: Spinal | Site: Hip | Laterality: Left

## 2021-09-10 MED ORDER — PHENOL 1.4 % MT LIQD: 1.0000 | OROMUCOSAL | Status: DC | PRN

## 2021-09-10 MED ORDER — ACETAMINOPHEN 325 MG PO TABS: 325.0000 mg | ORAL_TABLET | Freq: Four times a day (QID) | ORAL | Status: DC | PRN

## 2021-09-10 MED ORDER — DEXAMETHASONE SODIUM PHOSPHATE 10 MG/ML IJ SOLN: 8.0000 mg | Freq: Once | INTRAMUSCULAR | Status: DC

## 2021-09-10 MED ORDER — CEFAZOLIN SODIUM-DEXTROSE 2-4 GM/100ML-% IV SOLN
2.0000 g | INTRAVENOUS | Status: AC
  Administered 2021-09-10: 2 g via INTRAVENOUS
  Filled 2021-09-10: qty 100

## 2021-09-10 MED ORDER — HYDROMORPHONE HCL 1 MG/ML IJ SOLN: 0.2500 mg | INTRAMUSCULAR | Status: DC | PRN

## 2021-09-10 MED ORDER — MIDAZOLAM HCL 2 MG/2ML IJ SOLN: 0.5000 mg | Freq: Once | INTRAMUSCULAR | Status: DC | PRN

## 2021-09-10 MED ORDER — BISACODYL 10 MG RE SUPP: 10.0000 mg | Freq: Every day | RECTAL | Status: DC | PRN

## 2021-09-10 MED ORDER — FENTANYL CITRATE (PF) 100 MCG/2ML IJ SOLN
INTRAMUSCULAR | Status: AC
  Filled 2021-09-10: qty 2

## 2021-09-10 MED ORDER — HYDROCODONE-ACETAMINOPHEN 5-325 MG PO TABS
1.0000 | ORAL_TABLET | ORAL | Status: DC | PRN
  Administered 2021-09-10: 2 via ORAL
  Filled 2021-09-10: qty 2

## 2021-09-10 MED ORDER — OXYCODONE HCL 5 MG PO TABS: 5.0000 mg | ORAL_TABLET | Freq: Once | ORAL | Status: DC | PRN

## 2021-09-10 MED ORDER — METOCLOPRAMIDE HCL 5 MG PO TABS: 5.0000 mg | ORAL_TABLET | Freq: Three times a day (TID) | ORAL | Status: DC | PRN

## 2021-09-10 MED ORDER — BUPIVACAINE HCL 0.25 % IJ SOLN
INTRAMUSCULAR | Status: DC | PRN
  Administered 2021-09-10: 30 mL

## 2021-09-10 MED ORDER — MIDAZOLAM HCL 2 MG/2ML IJ SOLN
INTRAMUSCULAR | Status: DC | PRN
  Administered 2021-09-10 (×2): 1 mg via INTRAVENOUS

## 2021-09-10 MED ORDER — MORPHINE SULFATE (PF) 2 MG/ML IV SOLN: 0.5000 mg | INTRAVENOUS | Status: DC | PRN

## 2021-09-10 MED ORDER — SODIUM CHLORIDE 0.9 % IV SOLN: INTRAVENOUS | Status: DC

## 2021-09-10 MED ORDER — DEXAMETHASONE SODIUM PHOSPHATE 10 MG/ML IJ SOLN
INTRAMUSCULAR | Status: AC
  Filled 2021-09-10: qty 1

## 2021-09-10 MED ORDER — METOCLOPRAMIDE HCL 5 MG/ML IJ SOLN: 5.0000 mg | Freq: Three times a day (TID) | INTRAMUSCULAR | Status: DC | PRN

## 2021-09-10 MED ORDER — ONDANSETRON HCL 4 MG/2ML IJ SOLN
INTRAMUSCULAR | Status: DC | PRN
  Administered 2021-09-10: 4 mg via INTRAVENOUS

## 2021-09-10 MED ORDER — LIDOCAINE HCL (PF) 2 % IJ SOLN
INTRAMUSCULAR | Status: AC
  Filled 2021-09-10: qty 20

## 2021-09-10 MED ORDER — CARBIDOPA-LEVODOPA 25-100 MG PO TABS
3.5000 | ORAL_TABLET | Freq: Three times a day (TID) | ORAL | Status: DC
  Administered 2021-09-10 – 2021-09-11 (×3): 3.5 via ORAL
  Filled 2021-09-10 (×3): qty 4

## 2021-09-10 MED ORDER — POVIDONE-IODINE 10 % EX SWAB
2.0000 "application " | Freq: Once | CUTANEOUS | Status: AC
  Administered 2021-09-10: 2 via TOPICAL

## 2021-09-10 MED ORDER — TRANEXAMIC ACID-NACL 1000-0.7 MG/100ML-% IV SOLN
1000.0000 mg | INTRAVENOUS | Status: AC
  Administered 2021-09-10: 1000 mg via INTRAVENOUS
  Filled 2021-09-10: qty 100

## 2021-09-10 MED ORDER — METHOCARBAMOL 500 MG PO TABS
500.0000 mg | ORAL_TABLET | Freq: Four times a day (QID) | ORAL | Status: DC | PRN
  Administered 2021-09-11: 500 mg via ORAL
  Filled 2021-09-10: qty 1

## 2021-09-10 MED ORDER — MEPERIDINE HCL 50 MG/ML IJ SOLN
6.2500 mg | INTRAMUSCULAR | Status: DC | PRN
  Administered 2021-09-10: 6.25 mg via INTRAVENOUS

## 2021-09-10 MED ORDER — RASAGILINE MESYLATE 1 MG PO TABS
1.0000 mg | ORAL_TABLET | Freq: Every day | ORAL | Status: DC
  Administered 2021-09-11: 1 mg via ORAL
  Filled 2021-09-10: qty 1

## 2021-09-10 MED ORDER — MEPERIDINE HCL 50 MG/ML IJ SOLN
INTRAMUSCULAR | Status: AC
  Filled 2021-09-10: qty 1

## 2021-09-10 MED ORDER — ONDANSETRON HCL 4 MG PO TABS
4.0000 mg | ORAL_TABLET | Freq: Four times a day (QID) | ORAL | Status: DC | PRN
  Filled 2021-09-10: qty 1

## 2021-09-10 MED ORDER — HYDROCHLOROTHIAZIDE 25 MG PO TABS
25.0000 mg | ORAL_TABLET | Freq: Every day | ORAL | Status: DC
  Administered 2021-09-11: 25 mg via ORAL
  Filled 2021-09-10: qty 1

## 2021-09-10 MED ORDER — HYDROCODONE-ACETAMINOPHEN 7.5-325 MG PO TABS
1.0000 | ORAL_TABLET | ORAL | Status: DC | PRN
  Administered 2021-09-11 (×2): 2 via ORAL
  Filled 2021-09-10 (×2): qty 2

## 2021-09-10 MED ORDER — PROPOFOL 10 MG/ML IV BOLUS
INTRAVENOUS | Status: DC | PRN
  Administered 2021-09-10: 20 mg via INTRAVENOUS

## 2021-09-10 MED ORDER — EPHEDRINE 5 MG/ML INJ
INTRAVENOUS | Status: AC
  Filled 2021-09-10: qty 10

## 2021-09-10 MED ORDER — SODIUM CHLORIDE 0.9 % IR SOLN
Status: DC | PRN
  Administered 2021-09-10: 1000 mL

## 2021-09-10 MED ORDER — OXYCODONE HCL 5 MG/5ML PO SOLN: 5.0000 mg | Freq: Once | ORAL | Status: DC | PRN

## 2021-09-10 MED ORDER — CARBIDOPA-LEVODOPA ER 25-100 MG PO TBCR
1.0000 | EXTENDED_RELEASE_TABLET | Freq: Every day | ORAL | Status: DC
  Administered 2021-09-10: 1 via ORAL
  Filled 2021-09-10 (×2): qty 1

## 2021-09-10 MED ORDER — EPHEDRINE SULFATE-NACL 50-0.9 MG/10ML-% IV SOSY
PREFILLED_SYRINGE | INTRAVENOUS | Status: DC | PRN
  Administered 2021-09-10: 5 mg via INTRAVENOUS
  Administered 2021-09-10: 15 mg via INTRAVENOUS

## 2021-09-10 MED ORDER — FENTANYL CITRATE (PF) 100 MCG/2ML IJ SOLN
INTRAMUSCULAR | Status: DC | PRN
  Administered 2021-09-10: 25 ug via INTRAVENOUS

## 2021-09-10 MED ORDER — ACETAMINOPHEN 10 MG/ML IV SOLN
1000.0000 mg | Freq: Four times a day (QID) | INTRAVENOUS | Status: DC
  Administered 2021-09-10: 1000 mg via INTRAVENOUS
  Filled 2021-09-10: qty 100

## 2021-09-10 MED ORDER — PHENYLEPHRINE 40 MCG/ML (10ML) SYRINGE FOR IV PUSH (FOR BLOOD PRESSURE SUPPORT)
PREFILLED_SYRINGE | INTRAVENOUS | Status: AC
  Filled 2021-09-10: qty 10

## 2021-09-10 MED ORDER — ONDANSETRON HCL 4 MG/2ML IJ SOLN: 4.0000 mg | Freq: Four times a day (QID) | INTRAMUSCULAR | Status: DC | PRN

## 2021-09-10 MED ORDER — DOCUSATE SODIUM 100 MG PO CAPS
100.0000 mg | ORAL_CAPSULE | Freq: Two times a day (BID) | ORAL | Status: DC
  Administered 2021-09-10 – 2021-09-11 (×2): 100 mg via ORAL
  Filled 2021-09-10 (×2): qty 1

## 2021-09-10 MED ORDER — BUPIVACAINE IN DEXTROSE 0.75-8.25 % IT SOLN
INTRATHECAL | Status: DC | PRN
  Administered 2021-09-10: 15 mg via INTRATHECAL

## 2021-09-10 MED ORDER — ONDANSETRON HCL 4 MG/2ML IJ SOLN
INTRAMUSCULAR | Status: AC
  Filled 2021-09-10: qty 2

## 2021-09-10 MED ORDER — LINACLOTIDE 145 MCG PO CAPS
290.0000 ug | ORAL_CAPSULE | Freq: Every day | ORAL | Status: DC | PRN
  Filled 2021-09-10: qty 2

## 2021-09-10 MED ORDER — PRAMIPEXOLE DIHYDROCHLORIDE 0.25 MG PO TABS
0.5000 mg | ORAL_TABLET | Freq: Two times a day (BID) | ORAL | Status: DC
  Administered 2021-09-10 – 2021-09-11 (×2): 0.5 mg via ORAL
  Filled 2021-09-10 (×2): qty 2

## 2021-09-10 MED ORDER — CEFAZOLIN SODIUM-DEXTROSE 2-4 GM/100ML-% IV SOLN
2.0000 g | Freq: Four times a day (QID) | INTRAVENOUS | Status: AC
  Administered 2021-09-10 – 2021-09-11 (×2): 2 g via INTRAVENOUS
  Filled 2021-09-10 (×2): qty 100

## 2021-09-10 MED ORDER — STERILE WATER FOR IRRIGATION IR SOLN
Status: DC | PRN
  Administered 2021-09-10: 2000 mL

## 2021-09-10 MED ORDER — MENTHOL 3 MG MT LOZG: 1.0000 | LOZENGE | OROMUCOSAL | Status: DC | PRN

## 2021-09-10 MED ORDER — DEXAMETHASONE SODIUM PHOSPHATE 10 MG/ML IJ SOLN
INTRAMUSCULAR | Status: DC | PRN
  Administered 2021-09-10: 4 mg via INTRAVENOUS

## 2021-09-10 MED ORDER — PROPOFOL 500 MG/50ML IV EMUL
INTRAVENOUS | Status: AC
  Filled 2021-09-10: qty 50

## 2021-09-10 MED ORDER — GLYCOPYRROLATE 0.2 MG/ML IJ SOLN
INTRAMUSCULAR | Status: DC | PRN
  Administered 2021-09-10: .2 mg via INTRAVENOUS

## 2021-09-10 MED ORDER — LACTATED RINGERS IV SOLN: INTRAVENOUS | Status: DC

## 2021-09-10 MED ORDER — PROPOFOL 10 MG/ML IV BOLUS
INTRAVENOUS | Status: AC
  Filled 2021-09-10: qty 20

## 2021-09-10 MED ORDER — POLYETHYLENE GLYCOL 3350 17 G PO PACK: 17.0000 g | PACK | Freq: Every day | ORAL | Status: DC | PRN

## 2021-09-10 MED ORDER — LEVOTHYROXINE SODIUM 75 MCG PO TABS
75.0000 ug | ORAL_TABLET | Freq: Every day | ORAL | Status: DC
  Administered 2021-09-11: 75 ug via ORAL
  Filled 2021-09-10: qty 1

## 2021-09-10 MED ORDER — BUPIVACAINE HCL (PF) 0.25 % IJ SOLN
INTRAMUSCULAR | Status: AC
  Filled 2021-09-10: qty 30

## 2021-09-10 MED ORDER — METHOCARBAMOL 500 MG IVPB - SIMPLE MED
500.0000 mg | Freq: Four times a day (QID) | INTRAVENOUS | Status: DC | PRN
Start: 2021-09-10 — End: 2021-09-11
  Filled 2021-09-10: qty 50

## 2021-09-10 MED ORDER — PHENYLEPHRINE HCL-NACL 20-0.9 MG/250ML-% IV SOLN
INTRAVENOUS | Status: DC | PRN
Start: 2021-09-10 — End: 2021-09-10
  Administered 2021-09-10: 20 ug/min via INTRAVENOUS

## 2021-09-10 MED ORDER — MIDAZOLAM HCL 2 MG/2ML IJ SOLN
INTRAMUSCULAR | Status: AC
  Filled 2021-09-10: qty 2

## 2021-09-10 MED ORDER — LIDOCAINE 2% (20 MG/ML) 5 ML SYRINGE
INTRAMUSCULAR | Status: DC | PRN
  Administered 2021-09-10: 40 mg via INTRAVENOUS

## 2021-09-10 MED ORDER — ASPIRIN EC 325 MG PO TBEC
325.0000 mg | DELAYED_RELEASE_TABLET | Freq: Two times a day (BID) | ORAL | Status: DC
  Administered 2021-09-11: 325 mg via ORAL
  Filled 2021-09-10: qty 1

## 2021-09-10 MED ORDER — LOSARTAN POTASSIUM 50 MG PO TABS
100.0000 mg | ORAL_TABLET | Freq: Every day | ORAL | Status: DC
  Administered 2021-09-11: 100 mg via ORAL
  Filled 2021-09-10: qty 2

## 2021-09-10 MED ORDER — DEXAMETHASONE SODIUM PHOSPHATE 10 MG/ML IJ SOLN
10.0000 mg | Freq: Once | INTRAMUSCULAR | Status: AC
  Administered 2021-09-11: 10 mg via INTRAVENOUS
  Filled 2021-09-10: qty 1

## 2021-09-10 MED ORDER — PROPOFOL 500 MG/50ML IV EMUL
INTRAVENOUS | Status: DC | PRN
  Administered 2021-09-10: 50 ug/kg/min via INTRAVENOUS

## 2021-09-10 SURGICAL SUPPLY — 68 items
BAG COUNTER SPONGE SURGICOUNT (BAG) IMPLANT
BAG DECANTER FOR FLEXI CONT (MISCELLANEOUS) ×2 IMPLANT
BAG SPEC THK2 15X12 ZIP CLS (MISCELLANEOUS) ×2
BAG SPNG CNTER NS LX DISP (BAG)
BAG ZIPLOCK 12X15 (MISCELLANEOUS) ×4 IMPLANT
BIT DRILL 2.8X128 (BIT) ×2 IMPLANT
BLADE SAW SAG 73X25 THK (BLADE)
BLADE SAW SGTL 73X25 THK (BLADE) IMPLANT
COVER SURGICAL LIGHT HANDLE (MISCELLANEOUS) ×2 IMPLANT
DRAPE INCISE IOBAN 66X45 STRL (DRAPES) ×2 IMPLANT
DRAPE ORTHO SPLIT 77X108 STRL (DRAPES) ×4
DRAPE POUCH INSTRU U-SHP 10X18 (DRAPES) ×2 IMPLANT
DRAPE SURG ORHT 6 SPLT 77X108 (DRAPES) ×2 IMPLANT
DRAPE U-SHAPE 47X51 STRL (DRAPES) ×2 IMPLANT
DRESSING AQUACEL AG SP 3.5X10 (GAUZE/BANDAGES/DRESSINGS) IMPLANT
DRESSING MEPILEX FLEX 4X4 (GAUZE/BANDAGES/DRESSINGS) ×2 IMPLANT
DRSG AQUACEL AG SP 3.5X10 (GAUZE/BANDAGES/DRESSINGS) ×2
DRSG EMULSION OIL 3X16 NADH (GAUZE/BANDAGES/DRESSINGS) ×2 IMPLANT
DRSG MEPILEX BORDER 4X8 (GAUZE/BANDAGES/DRESSINGS) ×2 IMPLANT
DRSG MEPILEX FLEX 4X4 (GAUZE/BANDAGES/DRESSINGS) ×4
DURAPREP 26ML APPLICATOR (WOUND CARE) ×2 IMPLANT
ELECT REM PT RETURN 15FT ADLT (MISCELLANEOUS) ×2 IMPLANT
EVACUATOR 1/8 PVC DRAIN (DRAIN) ×2 IMPLANT
FACESHIELD WRAPAROUND (MASK) ×8 IMPLANT
FACESHIELD WRAPAROUND OR TEAM (MASK) ×4 IMPLANT
GAUZE SPONGE 4X4 12PLY STRL (GAUZE/BANDAGES/DRESSINGS) ×2 IMPLANT
GLOVE SRG 8 PF TXTR STRL LF DI (GLOVE) ×1 IMPLANT
GLOVE SURG ENC MOIS LTX SZ6.5 (GLOVE) ×4 IMPLANT
GLOVE SURG ENC MOIS LTX SZ8 (GLOVE) ×4 IMPLANT
GLOVE SURG UNDER POLY LF SZ7 (GLOVE) ×4 IMPLANT
GLOVE SURG UNDER POLY LF SZ8 (GLOVE) ×2
GOWN STRL REUS W/ TWL LRG LVL3 (GOWN DISPOSABLE) ×2 IMPLANT
GOWN STRL REUS W/TWL LRG LVL3 (GOWN DISPOSABLE) ×4
HANDPIECE INTERPULSE COAX TIP (DISPOSABLE)
HEAD M SROM 36MM 2 (Hips) IMPLANT
HEAD M SROM 36MM PLUS 1.5 (Hips) IMPLANT
IMMOBILIZER KNEE 20 (SOFTGOODS)
IMMOBILIZER KNEE 20 THIGH 36 (SOFTGOODS) IMPLANT
KIT BASIN OR (CUSTOM PROCEDURE TRAY) ×2 IMPLANT
KIT TURNOVER KIT A (KITS) IMPLANT
MANIFOLD NEPTUNE II (INSTRUMENTS) ×2 IMPLANT
NDL SAFETY ECLIPSE 18X1.5 (NEEDLE) ×1 IMPLANT
NEEDLE HYPO 18GX1.5 SHARP (NEEDLE) ×2
NS IRRIG 1000ML POUR BTL (IV SOLUTION) ×2 IMPLANT
PACK TOTAL JOINT (CUSTOM PROCEDURE TRAY) ×2 IMPLANT
PASSER SUT SWANSON 36MM LOOP (INSTRUMENTS) IMPLANT
PENCIL SMOKE EVACUATOR COATED (MISCELLANEOUS) ×2 IMPLANT
PIN ESC CONST ACE LINER 36X58 (Liner) ×1 IMPLANT
PROTECTOR NERVE ULNAR (MISCELLANEOUS) ×2 IMPLANT
SET HNDPC FAN SPRY TIP SCT (DISPOSABLE) IMPLANT
SPONGE T-LAP 18X18 ~~LOC~~+RFID (SPONGE) ×6 IMPLANT
SROM M HEAD 36MM 2 (Hips) ×2 IMPLANT
SROM M HEAD 36MM PLUS 1.5 (Hips) ×2 IMPLANT
STAPLER VISISTAT 35W (STAPLE) IMPLANT
STRIP CLOSURE SKIN 1/2X4 (GAUZE/BANDAGES/DRESSINGS) ×1 IMPLANT
SUCTION FRAZIER HANDLE 12FR (TUBING) ×1
SUCTION TUBE FRAZIER 12FR DISP (TUBING) ×1 IMPLANT
SUT ETHIBOND NAB CT1 #1 30IN (SUTURE) ×4 IMPLANT
SUT STRATAFIX 0 PDS 27 VIOLET (SUTURE) ×2
SUT VIC AB 2-0 CT1 27 (SUTURE) ×6
SUT VIC AB 2-0 CT1 TAPERPNT 27 (SUTURE) ×3 IMPLANT
SUTURE STRATFX 0 PDS 27 VIOLET (SUTURE) ×1 IMPLANT
SWAB COLLECTION DEVICE MRSA (MISCELLANEOUS) IMPLANT
SWAB CULTURE ESWAB REG 1ML (MISCELLANEOUS) IMPLANT
SYR 50ML LL SCALE MARK (SYRINGE) ×2 IMPLANT
TOWEL OR 17X26 10 PK STRL BLUE (TOWEL DISPOSABLE) ×4 IMPLANT
TRAY FOLEY MTR SLVR 16FR STAT (SET/KITS/TRAYS/PACK) ×2 IMPLANT
WATER STERILE IRR 1000ML POUR (IV SOLUTION) ×4 IMPLANT

## 2021-09-10 NOTE — Transfer of Care (Signed)
Immediate Anesthesia Transfer of Care Note ? ?Patient: Jim Carson ? ?Procedure(s) Performed: Left hip acetabular versus total hip arthroplasty revision, constrained liner (Left: Hip) ? ?Patient Location: PACU ? ?Anesthesia Type:Spinal ? ?Level of Consciousness: drowsy and responds to stimulation ? ?Airway & Oxygen Therapy: Patient Spontanous Breathing and Patient connected to face mask oxygen ? ?Post-op Assessment: Report given to RN and Post -op Vital signs reviewed and stable ? ?Post vital signs: Reviewed and stable ? ?Last Vitals:  ?Vitals Value Taken Time  ?BP 116/50 09/10/21 1602  ?Temp    ?Pulse 71 09/10/21 1604  ?Resp 15 09/10/21 1604  ?SpO2 100 % 09/10/21 1604  ?Vitals shown include unvalidated device data. ? ?Last Pain:  ?Vitals:  ? 09/10/21 1203  ?TempSrc:   ?PainSc: 0-No pain  ?   ? ?Patients Stated Pain Goal: 5 (09/10/21 1203) ? ?Complications: No notable events documented. ?

## 2021-09-10 NOTE — Anesthesia Preprocedure Evaluation (Addendum)
Anesthesia Evaluation  ?Patient identified by MRN, date of birth, ID band ?Patient awake ? ? ? ?Reviewed: ?Allergy & Precautions, NPO status , Patient's Chart, lab work & pertinent test results ? ?History of Anesthesia Complications ?Negative for: history of anesthetic complications ? ?Airway ?Mallampati: II ? ?TM Distance: >3 FB ?Neck ROM: Full ? ? ? Dental ? ?(+) Dental Advisory Given ?  ?Pulmonary ?sleep apnea and Continuous Positive Airway Pressure Ventilation , COPD,  COPD inhaler,  ?09/06/2021 SARS coronavirus NEG ?  ?breath sounds clear to auscultation ? ? ? ? ? ? Cardiovascular ?hypertension, Pt. on medications ?(-) angina ?Rhythm:Regular Rate:Normal ? ? ?  ?Neuro/Psych ?Parkinson's ?  ? GI/Hepatic ?GERD  Controlled and Medicated,  ?Endo/Other  ?Hypothyroidism obese ? Renal/GU ?negative Renal ROS  ? ?  ?Musculoskeletal ? ?(+) Arthritis , Osteoarthritis,   ? Abdominal ?(+) + obese,   ?Peds ? Hematology ?negative hematology ROS ?(+)   ?Anesthesia Other Findings ? ? Reproductive/Obstetrics ? ?  ? ? ? ? ? ? ? ? ? ? ? ? ? ?  ?  ? ? ? ? ? ? ? ?Anesthesia Physical ?Anesthesia Plan ? ?ASA: 3 ? ?Anesthesia Plan: Spinal  ? ?Post-op Pain Management: Ofirmev IV (intra-op)*  ? ?Induction:  ? ?PONV Risk Score and Plan: 1 and Ondansetron and Treatment may vary due to age or medical condition ? ?Airway Management Planned: Natural Airway and Simple Face Mask ? ?Additional Equipment: None ? ?Intra-op Plan:  ? ?Post-operative Plan:  ? ?Informed Consent: I have reviewed the patients History and Physical, chart, labs and discussed the procedure including the risks, benefits and alternatives for the proposed anesthesia with the patient or authorized representative who has indicated his/her understanding and acceptance.  ? ? ? ?Dental advisory given ? ?Plan Discussed with: CRNA and Surgeon ? ?Anesthesia Plan Comments:   ? ? ? ? ? ?Anesthesia Quick Evaluation ? ?

## 2021-09-10 NOTE — Op Note (Signed)
NAME: Jim Carson, Jim E. ?MEDICAL RECORD NO: [id number] ?ACCOUNT NO: [id number] ?DATE OF BIRTH: September 20, 1950 ?FACILITY: [facility] ?LOCATION: [facility] ?PHYSICIAN: Dione Plover. Chizuko Trine, MD ? ?Operative Report  ? ?DATE OF PROCEDURE: 09/10/2021 ? ?PREOPERATIVE DIAGNOSIS:  Recurrent dislocation, left total hip arthroplasty. ? ?POSTOPERATIVE DIAGNOSIS:  Recurrent dislocation, left total hip arthroplasty. ? ?PROCEDURE:  Left hip revision to constrained liner. ? ?SURGEON:  Gaynelle Arabian, MD ? ?ASSISTANT:  Theresa Duty, PA-C ? ?ANESTHESIA:  Spinal. ? ?ESTIMATED BLOOD LOSS:  150 mL ? ?DRAINS:  None. ? ?COMPLICATIONS:  None. ? ?CONDITION:  Stable to recovery. ? ?BRIEF CLINICAL NOTE:  The patient is a 71 year old male with Parkinson's disease who had a total hip arthroplasty done in [place] a few years ago.  He has had 3 dislocation episodes and presents now for revision.  We told him that the components were  ?in good position.  Potentially, we could just convert him to a constrained liner, but if there are any component malpositions, then we would have to redo the entire hip.  He understands and agrees and presents today for the revision. ? ?DESCRIPTION OF PROCEDURE:  After successful administration of spinal anesthetic, the patient was placed in the right lateral decubitus position with the left side up and held with a hip positioner.  Left lower extremity was isolated from his perineum  ?with plastic drapes and prepped and draped in the usual sterile fashion.  Previous posterolateral incision was utilized.  Skin cut with a 10 blade through subcutaneous tissue to the fascia lata, which was incised in line with the skin incision.  Sciatic  ?nerve was palpated and protected.  Most of the short rotators and posterior pseudocapsule had already torn off of the posterior femur from his previous dislocations.  I did do the dissection off of the femur to gain acetabular exposure.  Acetabular  ?retractors were placed.  The hip was dislocated  prior to placement of the retractors.  I took off the femoral head, was a 36+1.5 femoral head.  The femur was then retracted anteriorly to gain acetabular exposure.  Retractors were then placed.  He had a  ?36 mm neutral liner and we removed that.  His components were all in good position and were well fixed and we decided to just do the constrained liner as we did not need to revise any of the other components. A constrained liner for the 58 mm cup was  ?then impacted into the acetabular shell.  This was a marathon liner for a DePuy Pinnacle acetabular shell.  This locked in position.  Locking ring was then placed around the femoral neck and a 36-2 metal femoral head is placed.  The hip was reduced and  ?pops into position.  I then placed a locking ring and that locked into position.  He had excellent range of motion with great stability.  There was no impingement.  Wound was copiously irrigated with saline solution and then the posterior pseudocapsule  ?reattached to the femur through drill holes with Ethibond suture. Fascia lata was closed with a running 0 Stratafix suture.  Subcutaneous was closed with interrupted 2-0 Vicryl.  Note that 30 mL of 0.25% Marcaine with epinephrine were injected into the  ?subcutaneous tissues.  The subcutaneous was then closed with interrupted 2-0 Vicryl and subcuticular running 4-0 Monocryl.  Incisions cleaned and dried and Steri-Strips and a sterile dressing applied.  He was then awakened and transported to recovery in  ?stable condition. ? ? ?SHY ?D: 09/10/2021 3:48:00  pm T: 09/10/2021 10:34:00 pm  ?JOB: [id number]/ [id number]  ?

## 2021-09-10 NOTE — Anesthesia Procedure Notes (Signed)
Spinal ? ?Patient location during procedure: OR ?End time: 09/10/2021 1:53 PM ?Reason for block: surgical anesthesia ?Staffing ?Performed: anesthesiologist  ?Anesthesiologist: Annye Asa, MD ?Preanesthetic Checklist ?Completed: patient identified, IV checked, site marked, risks and benefits discussed, surgical consent, monitors and equipment checked, pre-op evaluation and timeout performed ?Spinal Block ?Patient position: sitting ?Prep: DuraPrep and site prepped and draped ?Patient monitoring: blood pressure, continuous pulse ox, cardiac monitor and heart rate ?Approach: midline ?Location: L3-4 ?Injection technique: single-shot ?Needle ?Needle type: Pencan and Introducer  ?Needle gauge: 24 G ?Needle length: 9 cm ?Assessment ?Events: CSF return ?Additional Notes ?Pt identified in Operating room.  Monitors applied. Working IV access confirmed. Sterile prep, drape lumbar spine.  1% lido local L 3,4.  #24ga Pencan into clear CSF L 3,4.  '15mg'$  0.75% Bupivacaine with dextrose injected with asp CSF beginning and end of injection.  Patient asymptomatic, VSS, no heme aspirated, tolerated well.  Jenita Seashore, MD ?  ? ? ? ?

## 2021-09-10 NOTE — Discharge Instructions (Signed)
Dr. Gaynelle Arabian Total Joint Specialist [facility] [address], [place], [place] [address] [phone]  POSTERIOR TOTAL HIP REPLACEMENT POSTOPERATIVE DIRECTIONS  Hip Rehabilitation, Guidelines Following Surgery  The results of a hip operation are greatly improved after range of motion and muscle strengthening exercises. Follow all safety measures which are given to protect your hip. If any of these exercises cause increased pain or swelling in your joint, decrease the amount until you are comfortable again. Then slowly increase the exercises. Call your caregiver if you have problems or questions.   BLOOD CLOT PREVENTION Take a 325 mg Aspirin two times a day for three weeks following surgery. Then take an 81 mg Aspirin once a day for three weeks. Then discontinue Aspirin. You may resume your vitamins/supplements upon discharge from the hospital. Do not take any NSAIDs (Advil, Aleve, Ibuprofen, Meloxicam, etc.) until you have discontinued the 325 mg Aspirin.  PRECAUTIONS (6 WEEKS FOLLOWING SURGERY) Do not bend your hip past a 90 degree angle Do not cross your legs. Dont twist your hip inwards- keep knees and toes pointed upwards   HOME CARE INSTRUCTIONS  Remove items at home which could result in a fall. This includes throw rugs or furniture in walking pathways.  ICE to the affected hip every three hours for 30 minutes at a time and then as needed for pain and swelling.  Continue to use ice on the hip for pain and swelling from surgery. You may notice swelling that will progress down to the foot and ankle.  This is normal after surgery.  Elevate the leg when you are not up walking on it.   Continue to use the breathing machine which will help keep your temperature down.  It is common for your temperature to cycle up and down following surgery, especially at night when you are not up moving around and exerting yourself.  The breathing machine keeps your lungs expanded  and your temperature down.  DIET You may resume your previous home diet once your are discharged from the hospital.  DRESSING / WOUND CARE / SHOWERING Keep the surgical dressing until follow up.  The dressing is water proof, so you can shower without any extra covering.  IF THE DRESSING FALLS OFF or the wound gets wet inside, change the dressing with sterile gauze.  Please use good hand washing techniques before changing the dressing.  Do not use any lotions or creams on the incision until instructed by your surgeon.   You may start showering once you are discharged home but do not submerge the incision under water. Just pat the incision dry and apply a dry gauze dressing on daily. Change the surgical dressing daily and reapply a dry dressing each time.  ACTIVITY Walk with your walker as instructed. Use walker as long as suggested by your caregivers. Avoid periods of inactivity such as sitting longer than an hour when not asleep. This helps prevent blood clots.  You may resume a sexual relationship in one month or when given the OK by your doctor.  You may return to work once you are cleared by your doctor.  Do not drive a car for 6 weeks or until released by you surgeon.  Do not drive while taking narcotics.  WEIGHT BEARING Weight bearing as tolerated with assist device (walker, cane, etc) as directed, use it as long as suggested by your surgeon or therapist, typically at least 4-6 weeks.  POST-OPERATIVE OPIOID TAPER INSTRUCTIONS: It is important to  wean off of your opioid medication as soon as possible. If you do not need pain medication after your surgery it is ok to stop day one. Opioids include: Codeine, Hydrocodone(Norco, Vicodin), Oxycodone(Percocet, oxycontin) and hydromorphone amongst others.  Long term and even short term use of opiods can cause: Increased pain response Dependence Constipation Depression Respiratory depression And more.  Withdrawal symptoms can include Flu  like symptoms Nausea, vomiting And more Techniques to manage these symptoms Hydrate well Eat regular healthy meals Stay active Use relaxation techniques(deep breathing, meditating, yoga) Do Not substitute Alcohol to help with tapering If you have been on opioids for less than two weeks and do not have pain than it is ok to stop all together.  Plan to wean off of opioids This plan should start within one week post op of your joint replacement. Maintain the same interval or time between taking each dose and first decrease the dose.  Cut the total daily intake of opioids by one tablet each day Next start to increase the time between doses. The last dose that should be eliminated is the evening dose.    POSTOPERATIVE CONSTIPATION PROTOCOL Constipation - defined medically as fewer than three stools per week and severe constipation as less than one stool per week.  One of the most common issues patients have following surgery is constipation.  Even if you have a regular bowel pattern at home, your normal regimen is likely to be disrupted due to multiple reasons following surgery.  Combination of anesthesia, postoperative narcotics, change in appetite and fluid intake all can affect your bowels.  In order to avoid complications following surgery, here are some recommendations in order to help you during your recovery period.  Colace (docusate) - Pick up an over-the-counter form of Colace or another stool softener and take twice a day as long as you are requiring postoperative pain medications.  Take with a full glass of water daily.  If you experience loose stools or diarrhea, hold the colace until you stool forms back up.  If your symptoms do not get better within 1 week or if they get worse, check with your doctor.  Dulcolax (bisacodyl) - Pick up over-the-counter and take as directed by the product packaging as needed to assist with the movement of your bowels.  Take with a full glass of water.   Use this product as needed if not relieved by Colace only.   MiraLax (polyethylene glycol) - Pick up over-the-counter to have on hand.  MiraLax is a solution that will increase the amount of water in your bowels to assist with bowel movements.  Take as directed and can mix with a glass of water, juice, soda, coffee, or tea.  Take if you go more than two days without a movement. Do not use MiraLax more than once per day. Call your doctor if you are still constipated or irregular after using this medication for 7 days in a row.  If you continue to have problems with postoperative constipation, please contact the office for further assistance and recommendations.  If you experience "the worst abdominal pain ever" or develop nausea or vomiting, please contact the office immediatly for further recommendations for treatment.  ITCHING  If you experience itching with your medications, try taking only a single pain pill, or even half a pain pill at a time.  You can also use Benadryl over the counter for itching or also to help with sleep.   TED HOSE STOCKINGS Wear the elastic  stockings on both legs for three weeks following surgery during the day but you may remove then at night for sleeping.  MEDICATIONS See your medication summary on the After Visit Summary that the nursing staff will review with you prior to discharge.  You may have some home medications which will be placed on hold until you complete the course of blood thinner medication.  It is important for you to complete the blood thinner medication as prescribed by your surgeon.  Continue your approved medications as instructed at time of discharge.  PRECAUTIONS If you experience chest pain or shortness of breath - call 911 immediately for transfer to the hospital emergency department.  If you develop a fever greater that 101 F, purulent drainage from wound, increased redness or drainage from wound, foul odor from the wound/dressing, or calf  pain - CONTACT YOUR SURGEON.                                                   FOLLOW-UP APPOINTMENTS Make sure you keep all of your appointments after your operation with your surgeon and caregivers. You should call the office at the above phone number and make an appointment for approximately two weeks after the date of your surgery or on the date instructed by your surgeon outlined in the "After Visit Summary".  RANGE OF MOTION AND STRENGTHENING EXERCISES  These exercises are designed to help you keep full movement of your hip joint. Follow your caregiver's or physical therapist's instructions. Perform all exercises about fifteen times, three times per day or as directed. Exercise both hips, even if you have had only one joint replacement. These exercises can be done on a training (exercise) mat, on the floor, on a table or on a bed. Use whatever works the best and is most comfortable for you. Use music or television while you are exercising so that the exercises are a pleasant break in your day. This will make your life better with the exercises acting as a break in routine you can look forward to.  Lying on your back, slowly slide your foot toward your buttocks, raising your knee up off the floor. Then slowly slide your foot back down until your leg is straight again.  Lying on your back spread your legs as far apart as you can without causing discomfort.  Lying on your side, raise your upper leg and foot straight up from the floor as far as is comfortable. Slowly lower the leg and repeat.  Lying on your back, tighten up the muscle in the front of your thigh (quadriceps muscles). You can do this by keeping your leg straight and trying to raise your heel off the floor. This helps strengthen the largest muscle supporting your knee.  Lying on your back, tighten up the muscles of your buttocks both with the legs straight and with the knee bent at a comfortable angle while keeping your heel on the floor.    IF YOU ARE TRANSFERRED TO A SKILLED REHAB FACILITY If the patient is transferred to a skilled rehab facility following release from the hospital, a list of the current medications will be sent to the facility for the patient to continue.  When discharged from the skilled rehab facility, please have the facility set up the patient's [place] prior to being released. Also,  the skilled facility will be responsible for providing the patient with their medications at time of release from the facility to include their pain medication, the muscle relaxants, and their blood thinner medication. If the patient is still at the rehab facility at time of the two week follow up appointment, the skilled rehab facility will also need to assist the patient in arranging follow up appointment in our office and any transportation needs.  MAKE SURE YOU:  Understand these instructions.  Get help right away if you are not doing well or get worse.    Pick up stool softner and laxative for home use following surgery while on pain medications. Do not submerge incision under water. Please use good hand washing techniques while changing dressing each day. May shower starting three days after surgery. Please use a clean towel to pat the incision dry following showers. Continue to use ice for pain and swelling after surgery. Do not use any lotions or creams on the incision until instructed by your surgeon.

## 2021-09-10 NOTE — Plan of Care (Signed)

## 2021-09-10 NOTE — Brief Op Note (Signed)
09/10/2021 ? ?3:41 PM ? ?PATIENT:  Jim Carson  71 y.o. male ? ?PRE-OPERATIVE DIAGNOSIS:  Recurrent dislocation left total hip arthroplasty ? ?POST-OPERATIVE DIAGNOSIS:  Recurrent dislocation left total hip arthroplasty ? ?PROCEDURE:  Procedure(s): ?Left hip acetabular versus total hip arthroplasty revision, constrained liner (Left) ? ?SURGEON:  Surgeon(s) and Role: ?   Gaynelle Arabian, MD - Primary ? ?PHYSICIAN ASSISTANT:  ? ?ASSISTANTS: Theresa Duty, PA-C  ? ?ANESTHESIA:   spinal ? ?EBL:  150 mL  ? ?BLOOD ADMINISTERED:none ? ?DRAINS: none  ? ?LOCAL MEDICATIONS USED:  MARCAINE    ? ?COUNTS:  YES ? ?TOURNIQUET:  * No tourniquets in log * ? ?DICTATION: .Other Dictation: Dictation Number [id number] ? ?PLAN OF CARE: Admit to inpatient  ? ?PATIENT DISPOSITION:  PACU - hemodynamically stable. ? ? ?

## 2021-09-10 NOTE — Anesthesia Procedure Notes (Signed)
Procedure Name: [place] ?Date/Time: 09/10/2021 1:45 PM ?Performed by: Eben Burow, CRNA ?Pre-anesthesia Checklist: Patient identified, Emergency Drugs available, Suction available, Patient being monitored and Timeout performed ?Oxygen Delivery Method: Simple face mask ?Placement Confirmation: positive ETCO2 ? ? ? ? ?

## 2021-09-10 NOTE — Anesthesia Postprocedure Evaluation (Signed)
Anesthesia Post Note ? ?Patient: Jim Carson ? ?Procedure(s) Performed: Left hip acetabular versus total hip arthroplasty revision, constrained liner (Left: Hip) ? ?  ? ?Patient location during evaluation: PACU ?Anesthesia Type: Spinal ?Level of consciousness: awake and alert, patient cooperative and oriented ?Pain management: pain level controlled ?Vital Signs Assessment: post-procedure vital signs reviewed and stable ?Respiratory status: spontaneous breathing, nonlabored ventilation and respiratory function stable ?Cardiovascular status: blood pressure returned to baseline and stable ?Postop Assessment: patient able to bend at knees, spinal receding and no apparent nausea or vomiting ?Anesthetic complications: no ? ? ?No notable events documented. ? ?Last Vitals:  ?Vitals:  ? 09/10/21 1709 09/10/21 1715  ?BP:  140/66  ?Pulse: 63 (!) 58  ?Resp: 12 13  ?Temp:    ?SpO2: 96% 97%  ?  ?Last Pain:  ?Vitals:  ? 09/10/21 1715  ?TempSrc:   ?PainSc: 0-No pain  ? ? ?  ?  ?  ?  ?  ?  ? ?Tattiana Fakhouri,E. Keaundre Thelin ? ? ? ? ?

## 2021-09-10 NOTE — Interval H&P Note (Signed)
History and Physical Interval Note: ? ?09/10/2021 ?12:49 PM ? ?Jim Carson  has presented today for surgery, with the diagnosis of Recurrent dislocation left total hip arthroplasty.  The various methods of treatment have been discussed with the patient and family. After consideration of risks, benefits and other options for treatment, the patient has consented to  Procedure(s): ?Left hip acetabular versus total hip arthroplasty revision, constrained liner (Left) as a surgical intervention.  The patient's history has been reviewed, patient examined, no change in status, stable for surgery.  I have reviewed the patient's chart and labs.  Questions were answered to the patient's satisfaction.   ? ? ?Jim Carson ? ? ?

## 2021-09-11 ENCOUNTER — Other Ambulatory Visit (HOSPITAL_COMMUNITY): Payer: Self-pay

## 2021-09-11 LAB — CBC
HCT: 38.2 % — ABNORMAL LOW (ref 39.0–52.0)
Hemoglobin: 12.7 g/dL — ABNORMAL LOW (ref 13.0–17.0)
MCH: 29.9 pg (ref 26.0–34.0)
MCHC: 33.2 g/dL (ref 30.0–36.0)
MCV: 89.9 fL (ref 80.0–100.0)
Platelets: 148 10*3/uL — ABNORMAL LOW (ref 150–400)
RBC: 4.25 MIL/uL (ref 4.22–5.81)
RDW: 13.2 % (ref 11.5–15.5)
WBC: 11.5 10*3/uL — ABNORMAL HIGH (ref 4.0–10.5)
nRBC: 0 % (ref 0.0–0.2)

## 2021-09-11 LAB — BASIC METABOLIC PANEL
Anion gap: 9 (ref 5–15)
BUN: 14 mg/dL (ref 8–23)
CO2: 28 mmol/L (ref 22–32)
Calcium: 8.8 mg/dL — ABNORMAL LOW (ref 8.9–10.3)
Chloride: 102 mmol/L (ref 98–111)
Creatinine, Ser: 1 mg/dL (ref 0.61–1.24)
GFR, Estimated: >60 mL/min (ref >60)
Glucose, Bld: 128 mg/dL — ABNORMAL HIGH (ref 70–99)
Potassium: 4.4 mmol/L (ref 3.5–5.1)
Sodium: 139 mmol/L (ref 135–145)

## 2021-09-11 MED ORDER — ASPIRIN 325 MG PO TBEC
325.0000 mg | DELAYED_RELEASE_TABLET | Freq: Two times a day (BID) | ORAL | 0 refills | Status: AC
Start: 2021-09-11 — End: 2021-10-01
  Filled 2021-09-11: qty 40, 20d supply, fill #0

## 2021-09-11 MED ORDER — METHOCARBAMOL 500 MG PO TABS
500.0000 mg | ORAL_TABLET | Freq: Four times a day (QID) | ORAL | 0 refills | Status: DC | PRN
  Filled 2021-09-11: qty 40, 10d supply, fill #0

## 2021-09-11 MED ORDER — HYDROCODONE-ACETAMINOPHEN 5-325 MG PO TABS
1.0000 | ORAL_TABLET | Freq: Four times a day (QID) | ORAL | 0 refills | Status: DC | PRN
  Filled 2021-09-11: qty 42, 6d supply, fill #0

## 2021-09-11 NOTE — Evaluation (Signed)
Physical Therapy Evaluation ?Patient Details ?Name: Jim Carson ?MRN: [id number] ?DOB: 02-26-1951 ?Today's Date: 09/11/2021 ? ?History of Present Illness ? 71 yo male s/p L THA-posterior 09/10/21. Hx of Parkinson's, L hip replacement 2019, cervical fusion 2013, lumbar lam 2015  ?Clinical Impression ? On eval, pt was Min guard-Min A for mobility He walked ~125 feet with a rollator. Moderate pain with activity. Intermittent assist to steady. Reviewed hip precautions and issued handout. Reviewed/practiced HEP and issued handout. Will plan to have a 2nd session prior to d/c home later today.    ?   ? ?Recommendations for follow up therapy are one component of a multi-disciplinary discharge planning process, led by the attending physician.  Recommendations may be updated based on patient status, additional functional criteria and insurance authorization. ? ?Follow Up Recommendations Follow physician's recommendations for discharge plan and follow up therapies ? ?  ?Assistance Recommended at Discharge Frequent or constant Supervision/Assistance  ?Patient can return home with the following ? A little help with bathing/dressing/bathroom;Assistance with cooking/housework;Assist for transportation;Help with stairs or ramp for entrance ? ?  ?Equipment Recommendations None recommended by PT  ?Recommendations for Other Services ?    ?  ?Functional Status Assessment Patient has had a recent decline in their functional status and demonstrates the ability to make significant improvements in function in a reasonable and predictable amount of time.  ? ?  ?Precautions / Restrictions Precautions ?Precautions: Fall;Posterior Hip ?Precaution Booklet Issued: Yes (comment) ?Restrictions ?Weight Bearing Restrictions: No ?LLE Weight Bearing: Weight bearing as tolerated  ? ?  ? ?Mobility ? Bed Mobility ?  ?  ?  ?  ?  ?  ?  ?General bed mobility comments: oob in recliner ?  ? ?Transfers ?Overall transfer level: Needs assistance ?Equipment used:  Rollator (4 wheels) ?Transfers: Sit to/from Stand ?Sit to Stand: Min guard ?  ?  ?  ?  ?  ?General transfer comment: MIn guard for safety. Increased time.Cues for safety. ?  ? ?Ambulation/Gait ?Ambulation/Gait assistance: Min assist ?Gait Distance (Feet): 125 Feet ?Assistive device: Rollator (4 wheels) ?Gait Pattern/deviations: Decreased stride length ?  ?  ?  ?General Gait Details: Cues for RW proximity, pacing. Intermittent assist to stabilize walker but mostly Min guard. Pt prefers to use rollator since this is what he uses at baseline. ? ?Stairs ?  ?  ?  ?  ?  ? ?Wheelchair Mobility ?  ? ?Modified Rankin (Stroke Patients Only) ?  ? ?  ? ?Balance Overall balance assessment: Needs assistance ?  ?  ?  ?  ?Standing balance support: Bilateral upper extremity supported, Reliant on assistive device for balance, During functional activity ?Standing balance-Leahy Scale: Fair ?  ?  ?  ?  ?  ?  ?  ?  ?  ?  ?  ?  ?   ? ? ? ?Pertinent Vitals/Pain Pain Assessment ?Pain Assessment: 0-10 ?Pain Score: 4  ?Pain Location: L hip ?Pain Descriptors / Indicators: Discomfort, Sore ?Pain Intervention(s): Limited activity within patient's tolerance, Monitored during session, Repositioned, Ice applied  ? ? ?Home Living Family/patient expects to be discharged to:: Private residence ?Living Arrangements: Spouse/significant other ?Available Help at Discharge: Family ?Type of Home: House ?Home Access: Level entry ?  ?  ?  ?Home Layout: One level ?Home Equipment: Rollator (4 wheels);Toilet riser;Rolling Walker (2 wheels);Cane - single point ?Additional Comments: built in toilet seat  ?  ?Prior Function Prior Level of Function : Independent/Modified Independent ?  ?  ?  ?  ?  ?  ?  Mobility Comments: using rollator ?  ?  ? ? ?Hand Dominance  ?   ? ?  ?Extremity/Trunk Assessment  ? Upper Extremity Assessment ?Upper Extremity Assessment: Overall WFL for tasks assessed ?  ? ?Lower Extremity Assessment ?Lower Extremity Assessment: Generalized  weakness ?  ? ?Cervical / Trunk Assessment ?Cervical / Trunk Assessment: Normal  ?Communication  ?    ?Cognition Arousal/Alertness: Awake/alert ?Behavior During Therapy: [facility] for tasks assessed/performed ?Overall Cognitive Status: Within Functional Limits for tasks assessed ?  ?  ?  ?  ?  ?  ?  ?  ?  ?  ?  ?  ?  ?  ?  ?  ?  ?  ?  ? ?  ?General Comments   ? ?  ?Exercises Total Joint Exercises ?Ankle Circles/Pumps: AROM, Both, 10 reps ?Quad Sets: AROM, Both, 10 reps ?Short Arc Quad: AROM, Left, 10 reps ?Hip ABduction/ADduction: AROM, Left, 10 reps  ? ?Assessment/Plan  ?  ?PT Assessment Patient needs continued PT services  ?PT Problem List Decreased strength;Decreased range of motion;Decreased mobility;Decreased activity tolerance;Decreased balance;Decreased knowledge of use of DME;Pain ? ?   ?  ?PT Treatment Interventions DME instruction;Gait training;Therapeutic exercise;Balance training;Functional mobility training;Therapeutic activities;Patient/family education   ? ?PT Goals (Current goals can be found in the Care Plan section)  ?Acute Rehab PT Goals ?Patient Stated Goal: home. regain PLOF/independence ?PT Goal Formulation: With patient/family ?Time For Goal Achievement: 09/25/21 ?Potential to Achieve Goals: Good ? ?  ?Frequency 7X/week ?  ? ? ?Co-evaluation   ?  ?  ?  ?  ? ? ?  ?AM-PAC PT "6 Clicks" Mobility  ?Outcome Measure Help needed turning from your back to your side while in a flat bed without using bedrails?: A Little ?Help needed moving from lying on your back to sitting on the side of a flat bed without using bedrails?: A Little ?Help needed moving to and from a bed to a chair (including a wheelchair)?: A Little ?Help needed standing up from a chair using your arms (e.g., wheelchair or bedside chair)?: A Little ?Help needed to walk in hospital room?: A Little ?Help needed climbing 3-5 steps with a railing? : A Little ?6 Click Score: 18 ? ?  ?End of Session Equipment Utilized During Treatment: Gait  belt ?Activity Tolerance: Patient tolerated treatment well ?Patient left: in chair;with call bell/phone within reach;with family/visitor present ?  ?PT Visit Diagnosis: Pain;Other abnormalities of gait and mobility (R26.89) ?Pain - Right/Left: Left ?Pain - part of body: Hip ?  ? ?Time: 4268-3419 ?PT Time Calculation (min) (ACUTE ONLY): 27 min ? ? ?Charges:   PT Evaluation ?$PT Eval Low Complexity: 1 Low ?PT Treatments ?$Gait Training: 8-22 mins ?  ?   ? ? ? ?Doreatha Massed, PT ?Acute Rehabilitation  ?Office: [phone] ?Pager: [phone] ? ?  ? ?

## 2021-09-11 NOTE — Progress Notes (Addendum)
Physical Therapy Treatment ?Patient Details ?Name: Jim Carson ?MRN: [id number] ?DOB: 05/29/1951 ?Today's Date: 09/11/2021 ? ? ?History of Present Illness 71 yo male s/p L THA-posterior 09/10/21. Hx of Parkinson's, L hip replacement 2019, cervical fusion 2013, lumbar lam 2015 ? ?  ?PT Comments  ? ? Pt continues to progress well. Instructed him to use standard RW if necessary. Verbally reviewed car transfer technique. All education completed.    ?Recommendations for follow up therapy are one component of a multi-disciplinary discharge planning process, led by the attending physician.  Recommendations may be updated based on patient status, additional functional criteria and insurance authorization. ? ?Follow Up Recommendations ? Follow physician's recommendations for discharge plan and follow up therapies ?  ?  ?Assistance Recommended at Discharge Intermittent Supervision/Assistance  ?Patient can return home with the following A little help with bathing/dressing/bathroom;Assistance with cooking/housework;Assist for transportation;Help with stairs or ramp for entrance ?  ?Equipment Recommendations ? None recommended by PT  ?  ?Recommendations for Other Services   ? ? ?  ?Precautions / Restrictions Precautions ?Precautions: Fall;Posterior Hip ?Precaution Booklet Issued: Yes (comment) ?Restrictions ?Weight Bearing Restrictions: No ?LLE Weight Bearing: Weight bearing as tolerated  ?  ? ?Mobility ? Bed Mobility ?  ?  ?  ?  ?  ?  ?  ?General bed mobility comments: oob in recliner ?  ? ?Transfers ?Overall transfer level: Needs assistance ?Equipment used: Rollator (4 wheels) ?Transfers: Sit to/from Stand ?Sit to Stand: Supervision ?  ?  ?  ?  ?  ?General transfer comment: Increased time.Cues for safety. ?  ? ?Ambulation/Gait ?Ambulation/Gait assistance: Min guard ?Gait Distance (Feet): 175 Feet ?Assistive device: Rollator (4 wheels) ?Gait Pattern/deviations: Decreased stride length, Decreased stance time - left ?  ?  ?   ?General Gait Details: Cues for RW proximity, pacing. Mild unsteadiness and decreased stance time on L towards end of distance. ? ? ?Stairs ?  ?  ?  ?  ?  ? ? ?Wheelchair Mobility ?  ? ?Modified Rankin (Stroke Patients Only) ?  ? ? ?  ?Balance Overall balance assessment: Needs assistance ?  ?  ?  ?  ?Standing balance support: Bilateral upper extremity supported, Reliant on assistive device for balance, During functional activity ?Standing balance-Leahy Scale: Fair ?  ?  ?  ?  ?  ?  ?  ?  ?  ?  ?  ?  ?  ? ?  ?Cognition Arousal/Alertness: Awake/alert ?Behavior During Therapy: [facility] for tasks assessed/performed ?Overall Cognitive Status: Within Functional Limits for tasks assessed ?  ?  ?  ?  ?  ?  ?  ?  ?  ?  ?  ?  ?  ?  ?  ?  ?  ?  ?  ? ?  ?Exercises  ? ?  ?General Comments   ?  ?  ? ?Pertinent Vitals/Pain Pain Assessment ?Pain Assessment: 0-10 ?Pain Score: 4  ?Pain Location: L hip ?Pain Descriptors / Indicators: Discomfort, Sore ?Pain Intervention(s): Monitored during session, Repositioned  ? ? ?Home Living Family/patient expects to be discharged to:: Private residence ?Living Arrangements: Spouse/significant other ?Available Help at Discharge: Family ?Type of Home: House ?Home Access: Level entry ?  ?  ?  ?Home Layout: One level ?Home Equipment: Rollator (4 wheels);Toilet riser;Rolling Walker (2 wheels);Cane - single point ?Additional Comments: built in toilet seat  ?  ?Prior Function    ?  ?  ?   ? ?PT Goals (current goals can now  be found in the care plan section) Acute Rehab PT Goals ?Patient Stated Goal: home. regain PLOF/independence ?PT Goal Formulation: With patient/family ?Time For Goal Achievement: 09/25/21 ?Potential to Achieve Goals: Good ?Progress towards PT goals: Progressing toward goals ? ?  ?Frequency ? ? ? 7X/week ? ? ? ?  ?PT Plan Current plan remains appropriate  ? ? ?Co-evaluation   ?  ?  ?  ?  ? ?  ?AM-PAC PT "6 Clicks" Mobility   ?Outcome Measure ? Help needed turning from your back to your  side while in a flat bed without using bedrails?: A Little ?Help needed moving from lying on your back to sitting on the side of a flat bed without using bedrails?: A Little ?Help needed moving to and from a bed to a chair (including a wheelchair)?: A Little ?Help needed standing up from a chair using your arms (e.g., wheelchair or bedside chair)?: A Little ?Help needed to walk in hospital room?: A Little ?Help needed climbing 3-5 steps with a railing? : A Little ?6 Click Score: 18 ? ?  ?End of Session Equipment Utilized During Treatment: Gait belt ?Activity Tolerance: Patient tolerated treatment well ?Patient left: in chair;with call bell/phone within reach;with family/visitor present ?  ?PT Visit Diagnosis: Pain;Other abnormalities of gait and mobility (R26.89) ?Pain - Right/Left: Left ?Pain - part of body: Hip ?  ? ? ?Time: 6195-0932 ?PT Time Calculation (min) (ACUTE ONLY): 9 min ? ?Charges:  $Gait Training: 8-22 mins          ?          ? ? ? ? ? ?Doreatha Massed, PT ?Acute Rehabilitation  ?Office: [phone] ?Pager: [phone] ? ?  ? ?

## 2021-09-11 NOTE — Progress Notes (Signed)
Provided discharge education/instructions, all questions and concerns addressed. Pt not ina cute distress, discharged home with belongings accompanied by wife. 

## 2021-09-11 NOTE — Progress Notes (Addendum)
? ?  Subjective: ?1 Day Post-Op Procedure(s) (LRB): ?Left hip acetabular versus total hip arthroplasty revision, constrained liner (Left) ?Patient reports pain as mild.   ?Patient seen in rounds by Dr. Wynelle Link. ?Patient is well, and has had no acute complaints or problems. No issues overnight. Foley cath removed this AM. Will start with PT this morning. ? ?Objective: ?Vital signs in last 24 hours: ?Temp:  [97.6 ?F (36.4 ?C)-99 ?F (37.2 ?C)] 98.2 ?F (36.8 ?C) (03/14 0503) ?Pulse Rate:  [58-87] 66 (03/14 0503) ?Resp:  [12-20] 18 (03/14 0503) ?BP: (116-144)/(50-109) 126/56 (03/14 0503) ?SpO2:  [96 %-100 %] 97 % (03/14 0503) ?Weight:  [97.5 kg] 97.5 kg (03/13 1203) ? ?Intake/Output from previous day: ? ?Intake/Output Summary (Last 24 hours) at 09/11/2021 0804 ?Last data filed at 09/11/2021 0600 ?Gross per 24 hour  ?Intake 3326.55 ml  ?Output 2400 ml  ?Net 926.55 ml  ?  ? ?Intake/Output this shift: ?No intake/output data recorded. ? ?Labs: ?Recent Labs  ?  09/11/21 ?0319  ?HGB 12.7*  ? ?Recent Labs  ?  09/11/21 ?0319  ?WBC 11.5*  ?RBC 4.25  ?HCT 38.2*  ?PLT 148*  ? ?Recent Labs  ?  09/11/21 ?0319  ?NA 139  ?K 4.4  ?CL 102  ?CO2 28  ?BUN 14  ?CREATININE 1.00  ?GLUCOSE 128*  ?CALCIUM 8.8*  ? ?No results for input(s): LABPT, INR in the last 72 hours. ? ?Exam: ?General - Patient is Alert and Oriented ?Extremity - Neurologically intact ?Neurovascular intact ?Sensation intact distally ?Dorsiflexion/Plantar flexion intact ?Dressing - dressing C/D/I ?Motor Function - intact, moving foot and toes well on exam.  ? ?Past Medical History:  ?Diagnosis Date  ? Abnormal gait   ? due to parkinson's,  uses cane  ? Allergic rhinitis   ? Benign essential hypertension   ? Cancer [facility])   ? hx of skin cancer  ? Chronic constipation   ? Deviated septum   ? GERD (gastroesophageal reflux disease)   ? History of multiple concussions   ? per pt as teen playing football, no residual  ? Hypothyroidism   ? followed by pcp  ? Left shoulder pain   ? OA  (osteoarthritis)   ? OSA on CPAP   ? Parkinson disease ([place])   ? neurologist-- dr b. Nicki Reaper  '@[facility]  Neurology  ? Pneumonia   ? Vitamin B12 deficiency   ? Vitamin D deficiency   ? Wears glasses   ? ? ?Assessment/Plan: ?1 Day Post-Op Procedure(s) (LRB): ?Left hip acetabular versus total hip arthroplasty revision, constrained liner (Left) ?Principal Problem: ?  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  Recurrent dislocation of left hip ? ?Estimated body mass index is 32.69 kg/m? as calculated from the following: ?  Height as of this encounter: '5\' 8"'$  (1.727 m). ?  Weight as of this encounter: 97.5 kg. ?Advance diet ?Up with therapy ?D/C IV fluids ? ?DVT Prophylaxis - Aspirin ?Weight bearing as tolerated. ?Begin therapy. Posterior hip precautions. ? ?Plan is to go Home after hospital stay. Plan for discharge today with either HHPT or HEP based on PT evaluation. Follow-up in 2 weeks. ? ?The PDMP database was reviewed today prior to any opioid medications being prescribed to this patient. ? ? ?Theresa Duty, PA-C ?Orthopedic Surgery ?[phone] ?09/11/2021, 8:04 AM  ?

## 2021-09-11 NOTE — TOC Transition Note (Signed)
Transition of Care (TOC) - CM/SW Discharge Note ? ? ?Patient Details  ?Name: BJ MORLOCK ?MRN: [id number] ?Date of Birth: [birth year] ? ?Transition of Care (TOC) CM/SW Contact:  ?Jaston Havens, LCSW ?Phone Number: ?09/11/2021, 9:41 AM ? ? ?Clinical Narrative:    ?Spoke with pt who confirms he has all needed DME at home.  Plan for HEP.  No TOC needs. ? ? ?Final next level of care: Home/Self Care ?Barriers to Discharge: No Barriers Identified ? ? ?Patient Goals and CMS Choice ?Patient states their goals for this hospitalization and ongoing recovery are:: return home ?  ?  ? ?Discharge Placement ?  ?           ?  ?  ?  ?  ? ?Discharge Plan and Services ?  ?  ?           ?DME Arranged: N/A ?DME Agency: NA ?  ?  ?  ?  ?  ?  ?  ?  ? ?Social Determinants of Health (SDOH) Interventions ?  ? ? ?Readmission Risk Interventions ?No flowsheet data found. ? ? ? ? ?

## 2021-09-11 NOTE — Plan of Care (Signed)
?  Problem: Education: Goal: Knowledge of the prescribed therapeutic regimen will improve Outcome: Progressing   Problem: Pain Management: Goal: Pain level will decrease with appropriate interventions Outcome: Progressing   Problem: Safety: Goal: Ability to remain free from injury will improve Outcome: Progressing   

## 2021-09-11 NOTE — Plan of Care (Signed)

## 2021-09-12 ENCOUNTER — Encounter (HOSPITAL_COMMUNITY): Payer: Self-pay | Admitting: Orthopedic Surgery

## 2021-09-12 NOTE — Discharge Summary (Signed)
Patient ID: Jim Carson MRN: [id number] DOB/AGE: Jul 17, 1950 71 y.o.  Admit date: 09/10/2021 Discharge date: 09/11/2021  Admission Diagnoses:  Principal Problem:   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HCC) Active Problems:   Recurrent dislocation of left hip   Discharge Diagnoses:  Same  Past Medical History:  Diagnosis Date   Abnormal gait    due to parkinson's,  uses cane   Allergic rhinitis    Benign essential hypertension    Cancer (HCC)    hx of skin cancer   Chronic constipation    Deviated septum    GERD (gastroesophageal reflux disease)    History of multiple concussions    per pt as teen playing football, no residual   Hypothyroidism    followed by pcp   Left shoulder pain    OA (osteoarthritis)    OSA on CPAP    Parkinson disease [facility])    neurologist-- dr b. Lorin Picket  @[facility]  Neurology   Pneumonia    Vitamin B12 deficiency    Vitamin D deficiency    Wears glasses     Surgeries: Procedure(s): Left hip acetabular versus total hip arthroplasty revision, constrained liner on 09/10/2021   Consultants:   Discharged Condition: Improved  Hospital Course: Jim Carson is an 71 y.o. male who was admitted 09/10/2021 for operative treatment of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HCC). Patient has severe unremitting pain that affects sleep, daily activities, and work/hobbies. After pre-op clearance the patient was taken to the operating room on 09/10/2021 and underwent  Procedure(s): Left hip acetabular versus total hip arthroplasty revision, constrained liner.    Patient was given perioperative antibiotics:  Anti-infectives (From admission, onward)    Start     Dose/Rate Route Frequency Ordered Stop   09/10/21 2000  ceFAZolin (ANCEF) IVPB 2g/100 mL premix        2 g 200 mL/hr over 30 Minutes Intravenous Every 6 hours 09/10/21 1751 09/11/21 0817   09/10/21 1145  ceFAZolin (ANCEF) IVPB 2g/100 mL premix        2 g 200 mL/hr over 30 Minutes  Intravenous On call to O.R. 09/10/21 1144 09/10/21 1405        Patient was given sequential compression devices, early ambulation, and chemoprophylaxis to prevent DVT.  Patient benefited maximally from hospital stay and there were no complications.    Recent vital signs: Patient Vitals for the past 24 hrs:  BP Temp Temp src Pulse Resp SpO2  09/11/21 1219 136/64 97.8 F (36.6 C) Oral 64 16 97 %     Recent laboratory studies:  Recent Labs    09/11/21 0319  WBC 11.5*  HGB 12.7*  HCT 38.2*  PLT 148*  NA 139  K 4.4  CL 102  CO2 28  BUN 14  CREATININE 1.00  GLUCOSE 128*  CALCIUM 8.8*     Discharge Medications:   Allergies as of 09/11/2021       Reactions   Other Itching, Rash   Other reaction(s): Other (See Comments) Dog hair, roaches, dust mites: very slight reaction: causes runny nose Dog hair, roaches, dust mites: very slight reaction: causes runny nose Dog hair, roaches, dust mites: very slight reaction: causes runny nose Dog hair, roaches, dust mites: very slight reaction: causes runny nose        Medication List     STOP taking these medications    diclofenac sodium 1 % Gel Commonly known as: VOLTAREN       TAKE these medications  aspirin 325 MG EC tablet Take 1 tablet (325 mg total) by mouth 2 (two) times daily for 20 days. Then take one 81 mg aspirin once a day for three weeks. Then discontinue aspirin. What changed:  medication strength how much to take when to take this reasons to take this additional instructions   carbidopa-levodopa 25-100 MG tablet Commonly known as: SINEMET IR Take 3.5 tablets by mouth 3 (three) times daily.   Carbidopa-Levodopa ER 25-100 MG tablet controlled release Commonly known as: SINEMET CR Take 1 tablet by mouth at bedtime.   desonide 0.05 % ointment Commonly known as: DESOWEN Apply 1 application topically daily as needed (irritation). Notes to patient: Resume home regimen   docusate sodium 100 MG  capsule Commonly known as: COLACE Take 100 mg by mouth daily as needed for moderate constipation.   hydrochlorothiazide 25 MG tablet Commonly known as: HYDRODIURIL Take 25 mg by mouth in the morning.   HYDROcodone-acetaminophen 5-325 MG tablet Commonly known as: NORCO/VICODIN Take 1-2 tablets by mouth every 6 (six) hours as needed for severe pain. Notes to patient: Last dose given 03/14 06:31am   ipratropium 0.06 % nasal spray Commonly known as: ATROVENT Place 2 sprays into both nostrils 2 (two) times daily as needed for rhinitis. Notes to patient: Resume home regimen   levothyroxine 75 MCG tablet Commonly known as: SYNTHROID Take 75 mcg by mouth daily before breakfast.   linaclotide 290 MCG Caps capsule Commonly known as: LINZESS Take 290 mcg by mouth daily as needed (constipation). Notes to patient: Resume home regimen   losartan 100 MG tablet Commonly known as: COZAAR Take 100 mg by mouth in the morning.   methocarbamol 500 MG tablet Commonly known as: ROBAXIN Take 1 tablet (500 mg total) by mouth every 6 (six) hours as needed for muscle spasms. Notes to patient: Last dose given 03/14 02:23am   ondansetron 4 MG disintegrating tablet Commonly known as: ZOFRAN-ODT Take 4 mg by mouth every 8 (eight) hours as needed for nausea or vomiting. Notes to patient: Last dose given 03/13 01:58pm   pramipexole 0.5 MG tablet Commonly known as: MIRAPEX Take 0.5 mg by mouth in the morning and at bedtime.   rasagiline 1 MG Tabs tablet Commonly known as: AZILECT Take 1 mg by mouth in the morning.   vitamin B-12 1000 MCG tablet Commonly known as: CYANOCOBALAMIN Take 1,000 mcg by mouth daily. Notes to patient: Resume home regimen   Vitamin D3 125 MCG (5000 UT) Caps Take 5,000 Units by mouth in the morning. Notes to patient: Resume home regimen               Discharge Care Instructions  (From admission, onward)           Start     Ordered   09/11/21 0000  Weight  bearing as tolerated        09/11/21 0814   09/11/21 0000  Change dressing       Comments: You have an adhesive waterproof bandage over the incision. Leave this in place until your first follow-up appointment. Once you remove this you will not need to place another bandage.   09/11/21 0814            Diagnostic Studies: DG Pelvis Portable  Result Date: 09/10/2021 CLINICAL DATA:  Status post left hip replacement EXAM: PORTABLE PELVIS 1-2 VIEWS COMPARISON:  None. FINDINGS: Status post left hip total arthroplasty with expected overlying postoperative change. No evidence of perihardware fracture or component malpositioning  in single frontal view. IMPRESSION: Status post left hip total arthroplasty with expected overlying postoperative change. No evidence of perihardware fracture or component malpositioning in single frontal view. Electronically Signed   By: Jearld Lesch M.D.   On: 09/10/2021 17:11    Disposition: Discharge disposition: 01-Home or Self Care       Discharge Instructions     Call MD / Call 911   Complete by: As directed    If you experience chest pain or shortness of breath, CALL 911 and be transported to the hospital emergency room.  If you develope a fever above 101 F, pus (white drainage) or increased drainage or redness at the wound, or calf pain, call your surgeon's office.   Change dressing   Complete by: As directed    You have an adhesive waterproof bandage over the incision. Leave this in place until your first follow-up appointment. Once you remove this you will not need to place another bandage.   Constipation Prevention   Complete by: As directed    Drink plenty of fluids.  Prune juice may be helpful.  You may use a stool softener, such as Colace (over the counter) 100 mg twice a day.  Use MiraLax (over the counter) for constipation as needed.   Diet - low sodium heart healthy   Complete by: As directed    Do not sit on low chairs, stoools or toilet seats,  as it may be difficult to get up from low surfaces   Complete by: As directed    Driving restrictions   Complete by: As directed    No driving for two weeks   Follow the hip precautions as taught in Physical Therapy   Complete by: As directed    Post-operative opioid taper instructions:   Complete by: As directed    POST-OPERATIVE OPIOID TAPER INSTRUCTIONS: It is important to wean off of your opioid medication as soon as possible. If you do not need pain medication after your surgery it is ok to stop day one. Opioids include: Codeine, Hydrocodone(Norco, Vicodin), Oxycodone(Percocet, oxycontin) and hydromorphone amongst others.  Long term and even short term use of opiods can cause: Increased pain response Dependence Constipation Depression Respiratory depression And more.  Withdrawal symptoms can include Flu like symptoms Nausea, vomiting And more Techniques to manage these symptoms Hydrate well Eat regular healthy meals Stay active Use relaxation techniques(deep breathing, meditating, yoga) Do Not substitute Alcohol to help with tapering If you have been on opioids for less than two weeks and do not have pain than it is ok to stop all together.  Plan to wean off of opioids This plan should start within one week post op of your joint replacement. Maintain the same interval or time between taking each dose and first decrease the dose.  Cut the total daily intake of opioids by one tablet each day Next start to increase the time between doses. The last dose that should be eliminated is the evening dose.      TED hose   Complete by: As directed    Use stockings (TED hose) for three weeks on both leg(s).  You may remove them at night for sleeping.   Weight bearing as tolerated   Complete by: As directed         Follow-up Information     Aluisio, Homero Fellers, MD. Schedule an appointment as soon as possible for a visit in 2 week(s).   Specialty: Orthopedic Surgery Contact  information: [address] [place] [address] [phone]                  Signed: Arther Abbott 09/12/2021, 9:52 AM

## 2021-11-20 ENCOUNTER — Other Ambulatory Visit (HOSPITAL_COMMUNITY): Payer: Self-pay

## 2021-11-21 ENCOUNTER — Other Ambulatory Visit: Payer: Self-pay | Admitting: Orthopedic Surgery

## 2021-11-21 DIAGNOSIS — G8929 Other chronic pain: Secondary | ICD-10-CM

## 2021-11-22 ENCOUNTER — Ambulatory Visit
Admission: RE | Admit: 2021-11-22 | Discharge: 2021-11-22 | Disposition: A | Discharge status: Home / Self Care | Payer: Medicare Other | Source: Ambulatory Visit | Attending: Orthopedic Surgery | Admitting: Orthopedic Surgery

## 2021-11-22 DIAGNOSIS — G8929 Other chronic pain: Secondary | ICD-10-CM

## 2021-11-22 DIAGNOSIS — M545 Low back pain, unspecified: Secondary | ICD-10-CM

## 2021-11-22 MED ORDER — IOPAMIDOL (ISOVUE-M 200) INJECTION 41%
1.0000 mL | Freq: Once | INTRAMUSCULAR | Status: AC
  Administered 2021-11-22: 1 mL via INTRA_ARTICULAR

## 2022-02-28 ENCOUNTER — Encounter: Payer: Self-pay | Admitting: Physical Therapy

## 2022-02-28 ENCOUNTER — Other Ambulatory Visit: Payer: Self-pay

## 2022-02-28 ENCOUNTER — Ambulatory Visit: Payer: Medicare Other | Attending: Neurology | Admitting: Physical Therapy

## 2022-02-28 DIAGNOSIS — R2689 Other abnormalities of gait and mobility: Secondary | ICD-10-CM | POA: Diagnosis not present

## 2022-02-28 DIAGNOSIS — R2681 Unsteadiness on feet: Secondary | ICD-10-CM | POA: Insufficient documentation

## 2022-02-28 DIAGNOSIS — M6281 Muscle weakness (generalized): Secondary | ICD-10-CM | POA: Insufficient documentation

## 2022-02-28 DIAGNOSIS — R29818 Other symptoms and signs involving the nervous system: Secondary | ICD-10-CM | POA: Insufficient documentation

## 2022-02-28 DIAGNOSIS — R293 Abnormal posture: Secondary | ICD-10-CM | POA: Diagnosis present

## 2022-02-28 NOTE — Therapy (Signed)
OUTPATIENT PHYSICAL THERAPY NEURO EVALUATION   Patient Name: Jim Carson MRN: [id number] DOB:March 29, 1951, 71 y.o., male Today's Date: 02/28/2022   PCP: Tiana Loft, MD REFERRING PROVIDER: Lavena Bullion, MD   PT End of Session - 02/28/22 1310     Visit Number 1    Number of Visits 9    Date for PT Re-Evaluation 03/29/22    Authorization Type Medicare/AARP    PT Start Time 0935    PT Stop Time 1020    PT Time Calculation (min) 45 min    Equipment Utilized During Treatment Gait belt    Activity Tolerance Patient tolerated treatment well    Behavior During Therapy WFL for tasks assessed/performed             Past Medical History:  Diagnosis Date   Abnormal gait    due to parkinson's,  uses cane   Allergic rhinitis    Benign essential hypertension    Cancer (HCC)    hx of skin cancer   Chronic constipation    Deviated septum    GERD (gastroesophageal reflux disease)    History of multiple concussions    per pt as teen playing football, no residual   Hypothyroidism    followed by pcp   Left shoulder pain    OA (osteoarthritis)    OSA on CPAP    Parkinson disease [facility])    neurologist-- dr b. Nicki Reaper  '@[facility]'$  Neurology   Pneumonia    Vitamin B12 deficiency    Vitamin D deficiency    Wears glasses    Past Surgical History:  Procedure Laterality Date   APPENDECTOMY  07/1964   LAPAROSCOPIC CHOLECYSTECTOMY  01-21-2017   '@[facility]'$ , [place]   POSTERIOR FUSION CERVICAL SPINE  08-15-2011   '@[facility]'$ , [place]   w/ laminectomy and decompression-- C3 -- T1   POSTERIOR LAMINECTOMY / DECOMPRESSION LUMBAR SPINE  07-30-2013   '@[facility] , [place]   bilateral T11 - 12,  L2 --5   REMOVAL LEFT T1 PARASPINOUS MASS  02-12-2016   [facility] ,  [place]   desmoid fibromatosis   SHOULDER ARTHROSCOPY WITH ROTATOR CUFF REPAIR Left 12/15/2018   Procedure: SHOULDER ARTHROSCOPY, biceps tenodesis labored Debridement, submacromial decompression;  Surgeon: Sydnee Cabal, MD;  Location: [place];  Service: Orthopedics;  Laterality: Left;  interscalene block   TOTAL HIP ARTHROPLASTY Left 09/18/2017   '@[facility]'$ , [place]   TOTAL HIP REVISION Left 09/10/2021   Procedure: Left hip acetabular versus total hip arthroplasty revision, constrained liner;  Surgeon: Gaynelle Arabian, MD;  Location: [facility];  Service: Orthopedics;  Laterality: Left;   Patient Active Problem List   Diagnosis Date Noted   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[place]) 09/10/2021   Recurrent dislocation of left hip 09/10/2021   Primary osteoarthritis of left hip 05/18/2021   PD (Parkinson's disease) ([place]) 05/18/2021   History of hypothyroidism 05/18/2021   History of gastroesophageal reflux (GERD) 05/18/2021   OSA (obstructive sleep apnea) 05/18/2021   Essential hypertension 05/18/2021    ONSET DATE: 02/14/2022 (MD referral)  REFERRING DIAG: Parkinson's disease  THERAPY DIAG:  Other abnormalities of gait and mobility  Unsteadiness on feet  Muscle weakness (generalized)  Other symptoms and signs involving the nervous system  Abnormal posture  Rationale for Evaluation and Treatment Rehabilitation  SUBJECTIVE:  SUBJECTIVE STATEMENT: Feels like my balance is not as it should be .  Have a 4-wheeled RW.  Have not had any falls in past 6 months, but near falls.  Finished therapy recently for strengthening hip following surgery in March. Pt accompanied by: self  PERTINENT HISTORY: multiple hip surgery revisions, L hip (most recent 08/2021).  Additional PMH see above  PAIN:  Are you having pain? Yes: NPRS scale: 1/10 Pain location: back Pain description: soreness Aggravating factors: lying flat Relieving factors: walking around  PRECAUTIONS: Fall and Other: Hx of L hip disclocation x 3 reps with bending/twisting  motions  WEIGHT BEARING RESTRICTIONS No  FALLS: Has patient fallen in last 6 months? No, but pt reports near falls.  LIVING ENVIRONMENT: Lives with: lives with their spouse Lives in: House/apartment Stairs: Yes: Internal: 12 steps; on right going up Has following equipment at home: Walker - 4 wheeled and 3-wheeled walker; has R ankle brace  PLOF: Needs assistance with gait  PATIENT GOALS To be better balanced  OBJECTIVE:   SENSATION: Light touch: impaired to light touch plantar aspect of R foot  COORDINATION: Decreased coordination RLE-knee hyperextension and weight on lateral aspect of R foot (from previous weakness R ankle due to back issues)  POSTURE: forward head and flexed trunk   LOWER EXTREMITY MMT:    MMT Right Eval Left Eval  Hip flexion 5 5  Hip extension    Hip abduction    Hip adduction    Hip internal rotation    Hip external rotation    Knee flexion 4+ 4+  Knee extension 5 5  Ankle dorsiflexion 4 5  Ankle plantarflexion    Ankle inversion 4 5  Ankle eversion 3+ 5  (Blank rows = not tested)   TRANSFERS: Assistive device utilized: None  Sit to stand: SBA Stand to sit: SBA Retropulsion with initial attempts at sit<>stand   GAIT: Gait pattern: step through pattern, decreased step length- Right, decreased step length- Left, decreased stance time- Right, and trunk flexed Distance walked: 60 ft x 2 Assistive device utilized: Walker - 4 wheeled Level of assistance: SBA Comments: PT recommends that pt use rollator or 3-wheeled RW (in home) at all times  FUNCTIONAL TESTs:  5 times sit to stand: 13.31 sec Timed up and go (TUG): 14.72 sec TUG cognitive:  16.93 sec  M-CTSIB  Condition 1: Firm Surface, EO 30 Sec, Normal Sway  Condition 2: Firm Surface, EC 30 Sec, Mild Sway  Condition 3: Foam Surface, EO 30 Sec, Mild Sway  Condition 4: Foam Surface, EC 8.03 Sec, Severe Sway   Posterior LOB on heels with condition 4  Posterior push and release:   2-3 steps, needs therapist directly behind to prevent LOB Lateral push and release: > 3 steps to R and to L, therapist assist to recover Four square step test:  13.06 sec, min guard and tendency for posterior lean Gait velocity:  13.81 sec with 4-wheeled RW:  2.38 ft/sec   PATIENT EDUCATION: Education details: PT eval results, POC Person educated: Patient Education method: Explanation Education comprehension: verbalized understanding   HOME EXERCISE PROGRAM: Not yet initiated    GOALS: Goals reviewed with patient? Yes  SHORT TERM GOALS: = LTGs  LONG TERM GOALS: Target date: 03/29/2022  Pt will be independent with HEP for improved balance, strength, gait. Baseline:  Goal status: INITIAL  2.  Pt will improve TUG/TUG cognitive score to less than or equal to 10% sec for decreased fall risk.  Baseline:  14.72 sec, 16.93 sec Goal status: INITIAL  3.  Pt will improve performance on Condition 4 on MCTSIB, to at least 20 seconds moderate sway, for improved vestibular system use for balance. Baseline: 8.03 sec posterior LOB Goal status: INITIAL  4.  Pt will regain balance in posterior and lateral push/release testing in 2 steps or less without therapist assist to regain balance. Baseline:  Goal status: INITIAL  ASSESSMENT:  CLINICAL IMPRESSION: Patient is a 71 y.o. male who was seen today for physical therapy evaluation and treatment for Parkinson's disease.   He presents to OPPT today with decreased balance, decreased timing and coordination of gait, abnormal posture, decreased strength.  He is at high risk of falls per TUG, TUG cognitive scores and he demonstrates decreased vestibular system use for balance with inability to hold balance eyes closed on foam >8 sec.  He has had a recent bout of therapy (not at this clinic), with pt reporting it was specifically to get stronger after hip surgery in March 2023.  Pt would benefit from therapy at this time to decrease fall risk and to  address balance, mobility related to Parkinson's deficits.    OBJECTIVE IMPAIRMENTS Abnormal gait, decreased balance, decreased mobility, difficulty walking, decreased strength, and postural dysfunction.   ACTIVITY LIMITATIONS transfers and locomotion level  PARTICIPATION LIMITATIONS: community activity  PERSONAL FACTORS Past/current experiences and 3+ comorbidities: see previous PMH-above  are also affecting patient's functional outcome.   REHAB POTENTIAL: Good  CLINICAL DECISION MAKING: Evolving/moderate complexity  EVALUATION COMPLEXITY: Moderate  PLAN: PT FREQUENCY: 2x/week  PT DURATION: 4 weeks  PLANNED INTERVENTIONS: Therapeutic exercises, Therapeutic activity, Neuromuscular re-education, Balance training, Gait training, Patient/Family education, Self Care, Orthotic/Fit training, and DME instructions  PLAN FOR NEXT SESSION: Asked pt to bring in his ankle brace for RLE to assist.  Work on hip, ankle strategies for balance, strengthening; Work to [employer].  Ask wife to be present for therapy to ensure carryover with safety recommendations and compliance with HEP.   Frazier Butt., PT 02/28/2022, 1:12 PM  [facility] at [facility] [place], [place],  [address] Phone # [phone] Fax # [phone]

## 2022-03-06 ENCOUNTER — Ambulatory Visit: Payer: Medicare Other | Attending: Neurology

## 2022-03-06 DIAGNOSIS — R293 Abnormal posture: Secondary | ICD-10-CM | POA: Diagnosis present

## 2022-03-06 DIAGNOSIS — M6281 Muscle weakness (generalized): Secondary | ICD-10-CM | POA: Insufficient documentation

## 2022-03-06 DIAGNOSIS — R29818 Other symptoms and signs involving the nervous system: Secondary | ICD-10-CM | POA: Diagnosis present

## 2022-03-06 DIAGNOSIS — R2689 Other abnormalities of gait and mobility: Secondary | ICD-10-CM | POA: Insufficient documentation

## 2022-03-06 DIAGNOSIS — R2681 Unsteadiness on feet: Secondary | ICD-10-CM | POA: Diagnosis present

## 2022-03-06 NOTE — Therapy (Signed)
OUTPATIENT PHYSICAL THERAPY NEURO TREATMENT   Patient Name: Jim Carson MRN: [id number] DOB:July 15, 1950, 71 y.o., male Today's Date: 03/06/2022   PCP: Tiana Loft, MD REFERRING PROVIDER: Lavena Bullion, MD   PT End of Session - 03/06/22 0934     Visit Number 2    Number of Visits 9    Date for PT Re-Evaluation 03/29/22    Authorization Type Medicare/AARP    PT Start Time 0930    PT Stop Time 1015    PT Time Calculation (min) 45 min    Equipment Utilized During Treatment Gait belt    Activity Tolerance Patient tolerated treatment well    Behavior During Therapy WFL for tasks assessed/performed             Past Medical History:  Diagnosis Date   Abnormal gait    due to parkinson's,  uses cane   Allergic rhinitis    Benign essential hypertension    Cancer (HCC)    hx of skin cancer   Chronic constipation    Deviated septum    GERD (gastroesophageal reflux disease)    History of multiple concussions    per pt as teen playing football, no residual   Hypothyroidism    followed by pcp   Left shoulder pain    OA (osteoarthritis)    OSA on CPAP    Parkinson disease [facility])    neurologist-- dr b. Nicki Reaper  '@[facility]'$  Neurology   Pneumonia    Vitamin B12 deficiency    Vitamin D deficiency    Wears glasses    Past Surgical History:  Procedure Laterality Date   APPENDECTOMY  07/1964   LAPAROSCOPIC CHOLECYSTECTOMY  01-21-2017   '@[facility] , [place]   POSTERIOR FUSION CERVICAL SPINE  08-15-2011   '@[facility]'$ , [place]   w/ laminectomy and decompression-- C3 -- T1   POSTERIOR LAMINECTOMY / DECOMPRESSION LUMBAR SPINE  07-30-2013   [facility] ,    bilateral T11 - 12,  L2 --5   REMOVAL LEFT T1 PARASPINOUS MASS  02-12-2016   [facility] ,  [place]   desmoid fibromatosis   SHOULDER ARTHROSCOPY WITH ROTATOR CUFF REPAIR Left 12/15/2018   Procedure: SHOULDER ARTHROSCOPY, biceps tenodesis labored Debridement, submacromial decompression;  Surgeon: Sydnee Cabal, MD;  Location: [place];   Service: Orthopedics;  Laterality: Left;  interscalene block   TOTAL HIP ARTHROPLASTY Left 09/18/2017   '@[facility]'$ , [place]   TOTAL HIP REVISION Left 09/10/2021   Procedure: Left hip acetabular versus total hip arthroplasty revision, constrained liner;  Surgeon: Gaynelle Arabian, MD;  [facility];  Service: Orthopedics;  Laterality: Left;   Patient Active Problem List   Diagnosis Date Noted   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[place]) 09/10/2021   Recurrent dislocation of left hip 09/10/2021   Primary osteoarthritis of left hip 05/18/2021   PD (Parkinson's disease) ([place]) 05/18/2021   History of hypothyroidism 05/18/2021   History of gastroesophageal reflux (GERD) 05/18/2021   OSA (obstructive sleep apnea) 05/18/2021   Essential hypertension 05/18/2021    ONSET DATE: 02/14/2022 (MD referral)  REFERRING DIAG: Parkinson's disease  THERAPY DIAG:  Other abnormalities of gait and mobility  Unsteadiness on feet  Muscle weakness (generalized)  Other symptoms and signs involving the nervous system  Abnormal posture  Rationale for Evaluation and Treatment Rehabilitation  SUBJECTIVE:  SUBJECTIVE STATEMENT: Been working on strengthening for LLE and [employer] at home and TRX strap system for  Pt accompanied by: self  PERTINENT HISTORY: multiple hip surgery revisions, L hip (most recent 08/2021).  Additional PMH see above  PAIN:  Are you having pain? Yes: NPRS scale: 1/10 Pain location: back Pain description: soreness Aggravating factors: lying flat Relieving factors: walking around  PRECAUTIONS: Fall and Other: Hx of L hip disclocation x 3 reps with bending/twisting motions  WEIGHT BEARING RESTRICTIONS No  FALLS: Has patient fallen in last 6 months? No, but pt reports near  falls.  LIVING ENVIRONMENT: Lives with: lives with their spouse Lives in: House/apartment Stairs: Yes: Internal: 12 steps; on right going up Has following equipment at home: Walker - 4 wheeled and 3-wheeled walker; has R ankle brace  PLOF: Needs assistance with gait  PATIENT GOALS To be better balanced  OBJECTIVE:   TODAY'S TREATMENT: 03/06/22 Activity Comments  Lateral step and reach At counter top 2x10 w/ CGA  Corner balance Feet apart/together, EO/EC 2x10 sec--head turns CGA for support   Resisted retro/forward w/ cable machine 2 laps 25#, 2 laps 20#, "push back with the balls of the feet, dig the heels in when going forward"  Counter top Sidestepping x 2 min Forward/backward x 2 min             HOME EXERCISE PROGRAM: Access Code: [email or web address] URL: [email or web address] Date: 03/06/2022 Prepared by: Sherlyn Lees  Exercises - Standing Balance in [place] with Eyes Closed  - 1 x daily - 7 x weekly - 3 reps - 10 sec hold - Corner Balance Feet Apart: Eyes Open With Head Turns  - 1 x daily - 7 x weekly - 3 reps - 10-30 sec hold - Corner Balance Feet Together With Eyes Open  - 1 x daily - 7 x weekly - 3 reps - 10-30 sec hold - Corner Balance Feet Together: Eyes Closed With Head Turns  - 1 x daily - 7 x weekly - 3 reps - 10-30 sec hold SENSATION: Light touch: impaired to light touch plantar aspect of R foot  COORDINATION: Decreased coordination RLE-knee hyperextension and weight on lateral aspect of R foot (from previous weakness R ankle due to back issues)  POSTURE: forward head and flexed trunk   LOWER EXTREMITY MMT:    MMT Right Eval Left Eval  Hip flexion 5 5  Hip extension    Hip abduction    Hip adduction    Hip internal rotation    Hip external rotation    Knee flexion 4+ 4+  Knee extension 5 5  Ankle dorsiflexion 4 5  Ankle plantarflexion    Ankle inversion 4 5  Ankle eversion 3+ 5  (Blank rows = not tested)   TRANSFERS: Assistive device  utilized: None  Sit to stand: SBA Stand to sit: SBA Retropulsion with initial attempts at sit<>stand   GAIT: Gait pattern: step through pattern, decreased step length- Right, decreased step length- Left, decreased stance time- Right, and trunk flexed Distance walked: 60 ft x 2 Assistive device utilized: Walker - 4 wheeled Level of assistance: SBA Comments: PT recommends that pt use rollator or 3-wheeled RW (in home) at all times  FUNCTIONAL TESTs:  5 times sit to stand: 13.31 sec Timed up and go (TUG): 14.72 sec TUG cognitive:  16.93 sec  M-CTSIB  Condition 1: Firm Surface, EO 30 Sec, Normal Sway  Condition 2: Firm Surface, EC 30 Sec, Mild Sway  Condition 3:  Foam Surface, EO 30 Sec, Mild Sway  Condition 4: Foam Surface, EC 8.03 Sec, Severe Sway   Posterior LOB on heels with condition 4  Posterior push and release:  2-3 steps, needs therapist directly behind to prevent LOB Lateral push and release: > 3 steps to R and to L, therapist assist to recover Four square step test:  13.06 sec, min guard and tendency for posterior lean Gait velocity:  13.81 sec with 4-wheeled RW:  2.38 ft/sec   PATIENT EDUCATION: Education details: PT eval results, POC Person educated: Patient Education method: Explanation Education comprehension: verbalized understanding   HOME EXERCISE PROGRAM: Access Code: [email or web address] URL: [email or web address] Date: 03/06/2022 Prepared by: Sherlyn Lees  Exercises - Standing Balance in [place] with Eyes Closed  - 1 x daily - 7 x weekly - 3 reps - 10 sec hold - Corner Balance Feet Apart: Eyes Open With Head Turns  - 1 x daily - 7 x weekly - 3 reps - 10-30 sec hold - Corner Balance Feet Together With Eyes Open  - 1 x daily - 7 x weekly - 3 reps - 10-30 sec hold - Corner Balance Feet Together: Eyes Closed With Head Turns  - 1 x daily - 7 x weekly - 3 reps - 10-30 sec hold    GOALS: Goals reviewed with patient? Yes  SHORT TERM GOALS: =  LTGs  LONG TERM GOALS: Target date: 03/29/2022  Pt will be independent with HEP for improved balance, strength, gait. Baseline:  Goal status: INITIAL  2.  Pt will improve TUG/TUG cognitive score to less than or equal to 10% sec for decreased fall risk.  Baseline: 14.72 sec, 16.93 sec Goal status: INITIAL  3.  Pt will improve performance on Condition 4 on MCTSIB, to at least 20 seconds moderate sway, for improved vestibular system use for balance. Baseline: 8.03 sec posterior LOB Goal status: INITIAL  4.  Pt will regain balance in posterior and lateral push/release testing in 2 steps or less without therapist assist to regain balance. Baseline:  Goal status: INITIAL  ASSESSMENT:  CLINICAL IMPRESSION: Initiated dynamic balance activities to emphasis large amplitude lateral and backward motions to facilitate hip/stepping strategies.  Initiated HEP for corner balance activities to improve balance and postural stability. Continued sessions to advance HEP and improve dynamic balance and improve righting reactions  OBJECTIVE IMPAIRMENTS Abnormal gait, decreased balance, decreased mobility, difficulty walking, decreased strength, and postural dysfunction.   ACTIVITY LIMITATIONS transfers and locomotion level  PARTICIPATION LIMITATIONS: community activity  PERSONAL FACTORS Past/current experiences and 3+ comorbidities: see previous PMH-above  are also affecting patient's functional outcome.   REHAB POTENTIAL: Good  CLINICAL DECISION MAKING: Evolving/moderate complexity  EVALUATION COMPLEXITY: Moderate  PLAN: PT FREQUENCY: 2x/week  PT DURATION: 4 weeks  PLANNED INTERVENTIONS: Therapeutic exercises, Therapeutic activity, Neuromuscular re-education, Balance training, Gait training, Patient/Family education, Self Care, Orthotic/Fit training, and DME instructions  PLAN FOR NEXT SESSION: TRX straps for UE support to perform lateral lunges and squats (if patient brings with  him)   Toniann Fail, PT 03/06/2022, 9:34 AM  [facility] at [facility] [address], [place], [place] [address] Phone # [phone] Fax # [phone]

## 2022-03-08 ENCOUNTER — Ambulatory Visit: Payer: Medicare Other

## 2022-03-08 DIAGNOSIS — R2681 Unsteadiness on feet: Secondary | ICD-10-CM

## 2022-03-08 DIAGNOSIS — R29818 Other symptoms and signs involving the nervous system: Secondary | ICD-10-CM

## 2022-03-08 DIAGNOSIS — R2689 Other abnormalities of gait and mobility: Secondary | ICD-10-CM

## 2022-03-08 DIAGNOSIS — R293 Abnormal posture: Secondary | ICD-10-CM

## 2022-03-08 DIAGNOSIS — M6281 Muscle weakness (generalized): Secondary | ICD-10-CM

## 2022-03-08 NOTE — Therapy (Signed)
OUTPATIENT PHYSICAL THERAPY NEURO TREATMENT   Patient Name: Jim Carson MRN: [id number] DOB:July 28, 1950, 71 y.o., male Today's Date: 03/08/2022   PCP: Tiana Loft, MD REFERRING PROVIDER: Lavena Bullion, MD   PT End of Session - 03/08/22 0850     Visit Number 3    Number of Visits 9    Date for PT Re-Evaluation 03/29/22    Authorization Type Medicare/AARP    PT Start Time 0845    PT Stop Time 0930    PT Time Calculation (min) 45 min    Equipment Utilized During Treatment Gait belt    Activity Tolerance Patient tolerated treatment well    Behavior During Therapy WFL for tasks assessed/performed             Past Medical History:  Diagnosis Date   Abnormal gait    due to parkinson's,  uses cane   Allergic rhinitis    Benign essential hypertension    Cancer (HCC)    hx of skin cancer   Chronic constipation    Deviated septum    GERD (gastroesophageal reflux disease)    History of multiple concussions    per pt as teen playing football, no residual   Hypothyroidism    followed by pcp   Left shoulder pain    OA (osteoarthritis)    OSA on CPAP    Parkinson disease [facility])    neurologist-- dr b. Nicki Reaper  '@[facility]  Neurology   Pneumonia    Vitamin B12 deficiency    Vitamin D deficiency    Wears glasses    Past Surgical History:  Procedure Laterality Date   APPENDECTOMY  07/1964   LAPAROSCOPIC CHOLECYSTECTOMY  01-21-2017   '@[facility] , [place]   POSTERIOR FUSION CERVICAL SPINE  08-15-2011   '@[facility]'$ , [place]   w/ laminectomy and decompression-- C3 -- T1   POSTERIOR LAMINECTOMY / DECOMPRESSION LUMBAR SPINE  07-30-2013   [facility] , [place]   bilateral T11 - 12,  L2 --5   REMOVAL LEFT T1 PARASPINOUS MASS  02-12-2016   [facility] ,  [place]   desmoid fibromatosis   SHOULDER ARTHROSCOPY WITH ROTATOR CUFF REPAIR Left 12/15/2018   Procedure: SHOULDER ARTHROSCOPY, biceps tenodesis labored Debridement, submacromial decompression;  Surgeon: Sydnee Cabal, MD;  Location: [facility];   Service: Orthopedics;  Laterality: Left;  interscalene block   TOTAL HIP ARTHROPLASTY Left 09/18/2017   '@[facility]'$ , [place]   TOTAL HIP REVISION Left 09/10/2021   Procedure: Left hip acetabular versus total hip arthroplasty revision, constrained liner;  Surgeon: Gaynelle Arabian, MD;  Location: [facility];  Service: Orthopedics;  Laterality: Left;   Patient Active Problem List   Diagnosis Date Noted   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[place]) 09/10/2021   Recurrent dislocation of left hip 09/10/2021   Primary osteoarthritis of left hip 05/18/2021   PD (Parkinson's disease) ([place]) 05/18/2021   History of hypothyroidism 05/18/2021   History of gastroesophageal reflux (GERD) 05/18/2021   OSA (obstructive sleep apnea) 05/18/2021   Essential hypertension 05/18/2021    ONSET DATE: 02/14/2022 (MD referral)  REFERRING DIAG: Parkinson's disease  THERAPY DIAG:  Other abnormalities of gait and mobility  Unsteadiness on feet  Muscle weakness (generalized)  Other symptoms and signs involving the nervous system  Abnormal posture  Rationale for Evaluation and Treatment Rehabilitation  SUBJECTIVE:  SUBJECTIVE STATEMENT: Feeling good, working out at home Pt accompanied by: self  PERTINENT HISTORY: multiple hip surgery revisions, L hip (most recent 08/2021).  Additional PMH see above  PAIN:  Are you having pain? Yes: NPRS scale: 1/10 Pain location: back Pain description: soreness Aggravating factors: lying flat Relieving factors: walking around  PRECAUTIONS: Fall and Other: Hx of L hip disclocation x 3 reps with bending/twisting motions  WEIGHT BEARING RESTRICTIONS No  FALLS: Has patient fallen in last 6 months? No, but pt reports near falls.  LIVING ENVIRONMENT: Lives with: lives with their  spouse Lives in: House/apartment Stairs: Yes: Internal: 12 steps; on right going up Has following equipment at home: Walker - 4 wheeled and 3-wheeled walker; has R ankle brace  PLOF: Needs assistance with gait  PATIENT GOALS To be better balanced  OBJECTIVE:   TODAY'S TREATMENT: 03/08/22 Activity Comments  NU-step resistance intervals x 8 min 2 min warm-up then 1:1 heavy:light  TRX straps -sit-stand 10x -lateral step 10x -skater's step (curtsey) 10x   Weight stack walkouts 5x Backward/forward 15# Lateral 10#             TODAY'S TREATMENT: 03/06/22 Activity Comments  Lateral step and reach At counter top 2x10 w/ CGA  Corner balance Feet apart/together, EO/EC 2x10 sec--head turns CGA for support   Resisted retro/forward w/ cable machine 2 laps 25#, 2 laps 20#, "push back with the balls of the feet, dig the heels in when going forward"  Counter top Sidestepping x 2 min Forward/backward x 2 min             HOME EXERCISE PROGRAM: Access Code: [email or web address] URL: [email or web address] Date: 03/06/2022 Prepared by: Sherlyn Lees  Exercises - Standing Balance in [place] with Eyes Closed  - 1 x daily - 7 x weekly - 3 reps - 10 sec hold - Corner Balance Feet Apart: Eyes Open With Head Turns  - 1 x daily - 7 x weekly - 3 reps - 10-30 sec hold - Corner Balance Feet Together With Eyes Open  - 1 x daily - 7 x weekly - 3 reps - 10-30 sec hold - Corner Balance Feet Together: Eyes Closed With Head Turns  - 1 x daily - 7 x weekly - 3 reps - 10-30 sec hold SENSATION: Light touch: impaired to light touch plantar aspect of R foot  COORDINATION: Decreased coordination RLE-knee hyperextension and weight on lateral aspect of R foot (from previous weakness R ankle due to back issues)  POSTURE: forward head and flexed trunk   LOWER EXTREMITY MMT:    MMT Right Eval Left Eval  Hip flexion 5 5  Hip extension    Hip abduction    Hip adduction    Hip internal rotation    Hip  external rotation    Knee flexion 4+ 4+  Knee extension 5 5  Ankle dorsiflexion 4 5  Ankle plantarflexion    Ankle inversion 4 5  Ankle eversion 3+ 5  (Blank rows = not tested)   TRANSFERS: Assistive device utilized: None  Sit to stand: SBA Stand to sit: SBA Retropulsion with initial attempts at sit<>stand   GAIT: Gait pattern: step through pattern, decreased step length- Right, decreased step length- Left, decreased stance time- Right, and trunk flexed Distance walked: 60 ft x 2 Assistive device utilized: Walker - 4 wheeled Level of assistance: SBA Comments: PT recommends that pt use rollator or 3-wheeled RW (in home) at all times  FUNCTIONAL TESTs:  5 times sit  to stand: 13.31 sec Timed up and go (TUG): 14.72 sec TUG cognitive:  16.93 sec  M-CTSIB  Condition 1: Firm Surface, EO 30 Sec, Normal Sway  Condition 2: Firm Surface, EC 30 Sec, Mild Sway  Condition 3: Foam Surface, EO 30 Sec, Mild Sway  Condition 4: Foam Surface, EC 8.03 Sec, Severe Sway   Posterior LOB on heels with condition 4  Posterior push and release:  2-3 steps, needs therapist directly behind to prevent LOB Lateral push and release: > 3 steps to R and to L, therapist assist to recover Four square step test:  13.06 sec, min guard and tendency for posterior lean Gait velocity:  13.81 sec with 4-wheeled RW:  2.38 ft/sec   PATIENT EDUCATION: Education details: PT eval results, POC Person educated: Patient Education method: Explanation Education comprehension: verbalized understanding   HOME EXERCISE PROGRAM: Access Code: [email or web address] URL: [email or web address] Date: 03/06/2022 Prepared by: Sherlyn Lees  Exercises - Standing Balance in [place] with Eyes Closed  - 1 x daily - 7 x weekly - 3 reps - 10 sec hold - Corner Balance Feet Apart: Eyes Open With Head Turns  - 1 x daily - 7 x weekly - 3 reps - 10-30 sec hold - Corner Balance Feet Together With Eyes Open  - 1 x daily - 7 x weekly - 3  reps - 10-30 sec hold - Corner Balance Feet Together: Eyes Closed With Head Turns  - 1 x daily - 7 x weekly - 3 reps - 10-30 sec hold Access Code: [email or web address] Exercises - Squat with TRX  - 1 x daily - 7 x weekly - 3 sets - 10 reps - Curtsy Lunge with TRX  - 1 x daily - 7 x weekly - 3 sets - 5 reps - Assisted Lunge with TRX  - 1 x daily - 7 x weekly - 3 sets - 5 reps   GOALS: Goals reviewed with patient? Yes  SHORT TERM GOALS: = LTGs  LONG TERM GOALS: Target date: 03/29/2022  Pt will be independent with HEP for improved balance, strength, gait. Baseline:  Goal status: INITIAL  2.  Pt will improve TUG/TUG cognitive score to less than or equal to 10% sec for decreased fall risk.  Baseline: 14.72 sec, 16.93 sec Goal status: INITIAL  3.  Pt will improve performance on Condition 4 on MCTSIB, to at least 20 seconds moderate sway, for improved vestibular system use for balance. Baseline: 8.03 sec posterior LOB Goal status: INITIAL  4.  Pt will regain balance in posterior and lateral push/release testing in 2 steps or less without therapist assist to regain balance. Baseline:  Goal status: INITIAL  ASSESSMENT:  CLINICAL IMPRESSION: Initiated with heavy intervals using NU-step for alternating movements. Instructed in use of TRX straps for HEP as he has a set and uses for upper body, initiated use of straps for dynamic stepping/balance/strength activities for home performance and demo safe practice for lower body movements. Difficulty with lateral maneuvering against resistance due to RLE instability. Initial difficulty with crossing midline but quick improvment with visual and demonstration cues  OBJECTIVE IMPAIRMENTS Abnormal gait, decreased balance, decreased mobility, difficulty walking, decreased strength, and postural dysfunction.   ACTIVITY LIMITATIONS transfers and locomotion level  PARTICIPATION LIMITATIONS: community activity  PERSONAL FACTORS Past/current experiences and 3+  comorbidities: see previous PMH-above  are also affecting patient's functional outcome.   REHAB POTENTIAL: Good  CLINICAL DECISION MAKING: Evolving/moderate complexity  EVALUATION COMPLEXITY: Moderate  PLAN: PT FREQUENCY: 2x/week  PT  DURATION: 4 weeks  PLANNED INTERVENTIONS: Therapeutic exercises, Therapeutic activity, Neuromuscular re-education, Balance training, Gait training, Patient/Family education, Self Care, Orthotic/Fit training, and DME instructions  PLAN FOR NEXT SESSION: balance on foam   Toniann Fail, PT 03/08/2022, 8:50 AM  [place] [facility] [address], [place], [place] [address] Phone # [phone] Fax # [phone]

## 2022-03-11 ENCOUNTER — Ambulatory Visit: Payer: Medicare Other | Admitting: Physical Therapy

## 2022-03-11 ENCOUNTER — Encounter: Payer: Self-pay | Admitting: Physical Therapy

## 2022-03-11 DIAGNOSIS — R29818 Other symptoms and signs involving the nervous system: Secondary | ICD-10-CM

## 2022-03-11 DIAGNOSIS — M6281 Muscle weakness (generalized): Secondary | ICD-10-CM

## 2022-03-11 DIAGNOSIS — R2689 Other abnormalities of gait and mobility: Secondary | ICD-10-CM | POA: Diagnosis not present

## 2022-03-11 DIAGNOSIS — R2681 Unsteadiness on feet: Secondary | ICD-10-CM

## 2022-03-11 NOTE — Therapy (Signed)
OUTPATIENT PHYSICAL THERAPY NEURO TREATMENT   Patient Name: Jim Carson MRN: [id number] DOB:06-07-1951, 71 y.o., male Today's Date: 03/11/2022   PCP: Tiana Loft, MD REFERRING PROVIDER: Lavena Bullion, MD   PT End of Session - 03/11/22 0852     Visit Number 4    Number of Visits 9    Date for PT Re-Evaluation 03/29/22    Authorization Type Medicare/AARP    PT Start Time 0849    PT Stop Time 0930    PT Time Calculation (min) 41 min    Equipment Utilized During Treatment Gait belt    Activity Tolerance Patient tolerated treatment well    Behavior During Therapy WFL for tasks assessed/performed              Past Medical History:  Diagnosis Date   Abnormal gait    due to parkinson's,  uses cane   Allergic rhinitis    Benign essential hypertension    Cancer (HCC)    hx of skin cancer   Chronic constipation    Deviated septum    GERD (gastroesophageal reflux disease)    History of multiple concussions    per pt as teen playing football, no residual   Hypothyroidism    followed by pcp   Left shoulder pain    OA (osteoarthritis)    OSA on CPAP    Parkinson disease [facility])    neurologist-- dr b. Nicki Reaper  '@[facility]'$  Neurology   Pneumonia    Vitamin B12 deficiency    Vitamin D deficiency    Wears glasses    Past Surgical History:  Procedure Laterality Date   APPENDECTOMY  07/1964   LAPAROSCOPIC CHOLECYSTECTOMY  01-21-2017   '@[facility] , [place]   POSTERIOR FUSION CERVICAL SPINE  08-15-2011   '@[facility] , [place]   w/ laminectomy and decompression-- C3 -- T1   POSTERIOR LAMINECTOMY / DECOMPRESSION LUMBAR SPINE  07-30-2013   [facility] , [place]   bilateral T11 - 12,  L2 --5   REMOVAL LEFT T1 PARASPINOUS MASS  02-12-2016   [facility] ,  [place]   desmoid fibromatosis   SHOULDER ARTHROSCOPY WITH ROTATOR CUFF REPAIR Left 12/15/2018   Procedure: SHOULDER ARTHROSCOPY, biceps tenodesis labored Debridement, submacromial decompression;  Surgeon: Sydnee Cabal, MD;  Location: [facility];  Service: Orthopedics;  Laterality: Left;  interscalene block   TOTAL HIP ARTHROPLASTY Left 09/18/2017   '@[facility]'$ , [place]   TOTAL HIP REVISION Left 09/10/2021   Procedure: Left hip acetabular versus total hip arthroplasty revision, constrained liner;  Surgeon: Gaynelle Arabian, MD;  [facility];  Service: Orthopedics;  Laterality: Left;   Patient Active Problem List   Diagnosis Date Noted   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[place]) 09/10/2021   Recurrent dislocation of left hip 09/10/2021   Primary osteoarthritis of left hip 05/18/2021   PD (Parkinson's disease) ([place]) 05/18/2021   History of hypothyroidism 05/18/2021   History of gastroesophageal reflux (GERD) 05/18/2021   OSA (obstructive sleep apnea) 05/18/2021   Essential hypertension 05/18/2021    ONSET DATE: 02/14/2022 (MD referral)  REFERRING DIAG: Parkinson's disease  THERAPY DIAG:  Other abnormalities of gait and mobility  Unsteadiness on feet  Muscle weakness (generalized)  Other symptoms and signs involving the nervous system  Rationale for Evaluation and Treatment Rehabilitation  SUBJECTIVE:  SUBJECTIVE STATEMENT: No falls, but near fall during [employer].  Rollator in other car, so used umbrella for support to come in today. Pt accompanied by: self  PERTINENT HISTORY: multiple hip surgery revisions, L hip (most recent 08/2021).  Additional PMH see above  PAIN:  No pain.  PRECAUTIONS: Fall and Other: Hx of L hip disclocation x 3 reps with bending/twisting motions  WEIGHT BEARING RESTRICTIONS No  FALLS: Has patient fallen in last 6 months? No, but pt reports near falls.  LIVING ENVIRONMENT: Lives with: lives with their spouse Lives in: House/apartment Stairs: Yes: Internal: 12 steps; on right  going up Has following equipment at home: Walker - 4 wheeled and 3-wheeled walker; has R ankle brace  PLOF: Needs assistance with gait  PATIENT GOALS To be better balanced  OBJECTIVE:    TODAY'S TREATMENT: 03/11/2022 Activity Comments  NuStep, Level 3>5, 4 extremities x 9 min Cues to lessen lateral trunk movements; keeps SPM>90-100  Reviewed/performed corner balance exercises part of HEP: EO feet apart with head turns/nods EC feet apart 10 sec x 3 sec EO feet together 30 sec EC feet together head turns/nods Cues for posture to assist with balance  In parallel bars standing on [employer]: Feet apart, EO head turns/nods x 10 Feet apart, EC head steady x 10 sec, 3 reps alt UE reaches x 10 reps Reaching across body x 10 reps each side  Light UE support, cues for quad/glut activation for full knee extension  PWR! Moves exercises:  PWR! Up x 5, PWR! Rock x 5, PWR! Step x 5 reps each side; then added use of [place] for increased balance challenge Cues for intermittent support, as pt has posterior LOB.  Cues for slowed pace of exercises thorughout for improved control)  Resisted forward gait in parallel bars, backwards gait no resistance, 3 reps, BUE support Cues for wider BOS  Use of rollator between activities in gym area, with discussion on gait safety.  Cues and min guard for posture, slowed pace with gait with rollator    PATIENT EDUCATION: Education details: Use of rollator at all times for safety with gait (pt walks in with umbrella for support today and PT discusses decreased stability and near LOB with hastening of gait to NuStep). Person educated: Patient Education method: Explanation, Demonstration, and Verbal cues Education comprehension: verbalized understanding, returned demonstration, and verbal cues required  --------------------------------------------------------------------------------------- Objective measures from eval:   SENSATION: Light touch: impaired to light  touch plantar aspect of R foot  COORDINATION: Decreased coordination RLE-knee hyperextension and weight on lateral aspect of R foot (from previous weakness R ankle due to back issues)  POSTURE: forward head and flexed trunk   LOWER EXTREMITY MMT:    MMT Right Eval Left Eval  Hip flexion 5 5  Hip extension    Hip abduction    Hip adduction    Hip internal rotation    Hip external rotation    Knee flexion 4+ 4+  Knee extension 5 5  Ankle dorsiflexion 4 5  Ankle plantarflexion    Ankle inversion 4 5  Ankle eversion 3+ 5  (Blank rows = not tested)   TRANSFERS: Assistive device utilized: None  Sit to stand: SBA Stand to sit: SBA Retropulsion with initial attempts at sit<>stand   GAIT: Gait pattern: step through pattern, decreased step length- Right, decreased step length- Left, decreased stance time- Right, and trunk flexed Distance walked: 60 ft x 2 Assistive device utilized: Walker - 4 wheeled Level of  assistance: SBA Comments: PT recommends that pt use rollator or 3-wheeled RW (in home) at all times  FUNCTIONAL TESTs:  5 times sit to stand: 13.31 sec Timed up and go (TUG): 14.72 sec TUG cognitive:  16.93 sec  M-CTSIB  Condition 1: Firm Surface, EO 30 Sec, Normal Sway  Condition 2: Firm Surface, EC 30 Sec, Mild Sway  Condition 3: Foam Surface, EO 30 Sec, Mild Sway  Condition 4: Foam Surface, EC 8.03 Sec, Severe Sway   Posterior LOB on heels with condition 4  Posterior push and release:  2-3 steps, needs therapist directly behind to prevent LOB Lateral push and release: > 3 steps to R and to L, therapist assist to recover Four square step test:  13.06 sec, min guard and tendency for posterior lean Gait velocity:  13.81 sec with 4-wheeled RW:  2.38 ft/sec   PATIENT EDUCATION: Education details: PT eval results, POC Person educated: Patient Education method: Explanation Education comprehension: verbalized  understanding -------------------------------------------------------- HOME EXERCISE PROGRAM: Access Code: [email or web address] URL: [email or web address] Date: 03/06/2022 Prepared by: Sherlyn Lees  Exercises - Standing Balance in [place] with Eyes Closed  - 1 x daily - 7 x weekly - 3 reps - 10 sec hold - Corner Balance Feet Apart: Eyes Open With Head Turns  - 1 x daily - 7 x weekly - 3 reps - 10-30 sec hold - Corner Balance Feet Together With Eyes Open  - 1 x daily - 7 x weekly - 3 reps - 10-30 sec hold - Corner Balance Feet Together: Eyes Closed With Head Turns  - 1 x daily - 7 x weekly - 3 reps - 10-30 sec hold Access Code: [email or web address] Exercises - Squat with TRX  - 1 x daily - 7 x weekly - 3 sets - 10 reps - Curtsy Lunge with TRX  - 1 x daily - 7 x weekly - 3 sets - 5 reps - Assisted Lunge with TRX  - 1 x daily - 7 x weekly - 3 sets - 5 reps   GOALS: Goals reviewed with patient? Yes  SHORT TERM GOALS: = LTGs  LONG TERM GOALS: Target date: 03/29/2022  Pt will be independent with HEP for improved balance, strength, gait. Baseline:  Goal status: INITIAL  2.  Pt will improve TUG/TUG cognitive score to less than or equal to 10% sec for decreased fall risk.  Baseline: 14.72 sec, 16.93 sec Goal status: INITIAL  3.  Pt will improve performance on Condition 4 on MCTSIB, to at least 20 seconds moderate sway, for improved vestibular system use for balance. Baseline: 8.03 sec posterior LOB Goal status: INITIAL  4.  Pt will regain balance in posterior and lateral push/release testing in 2 steps or less without therapist assist to regain balance. Baseline:  Goal status: INITIAL  ASSESSMENT:  CLINICAL IMPRESSION: Skilled PT session focused today on lower extremity strengthening as well as compliant surface balance.  With transitions in movement and gait, with short distance gait using rollator walker, pt has quick movement patterns, forward flexed posture and hastening of gait.  He  needs cues for slowed pace, upright posture with gait for improved gait safety and stability.  With standing exercises, he does tend to have posterior lean through trunk, with pt providing heavy tactile and verbal cues for activation through quads and gluts to lessen posterior lean.  He will continue to benefit from skilled PT towards goals for improved functional mobility and decreased fall risk.  OBJECTIVE IMPAIRMENTS Abnormal gait, decreased balance,  decreased mobility, difficulty walking, decreased strength, and postural dysfunction.   ACTIVITY LIMITATIONS transfers and locomotion level  PARTICIPATION LIMITATIONS: community activity  PERSONAL FACTORS Past/current experiences and 3+ comorbidities: see previous PMH-above  are also affecting patient's functional outcome.   REHAB POTENTIAL: Good  CLINICAL DECISION MAKING: Evolving/moderate complexity  EVALUATION COMPLEXITY: Moderate  PLAN: PT FREQUENCY: 2x/week  PT DURATION: 4 weeks  PLANNED INTERVENTIONS: Therapeutic exercises, Therapeutic activity, Neuromuscular re-education, Balance training, Gait training, Patient/Family education, Self Care, Orthotic/Fit training, and DME instructions  PLAN FOR NEXT SESSION: Continue balance on foam, resisted gait in forward and backwards direction.     Frazier Butt., PT 03/11/2022, 12:01 PM  [facility] at [facility], [place], [place] [address] Phone # [phone] Fax # [phone]

## 2022-03-13 ENCOUNTER — Ambulatory Visit: Payer: Medicare Other

## 2022-03-13 DIAGNOSIS — R2689 Other abnormalities of gait and mobility: Secondary | ICD-10-CM | POA: Diagnosis not present

## 2022-03-13 DIAGNOSIS — R29818 Other symptoms and signs involving the nervous system: Secondary | ICD-10-CM

## 2022-03-13 DIAGNOSIS — R293 Abnormal posture: Secondary | ICD-10-CM

## 2022-03-13 DIAGNOSIS — R2681 Unsteadiness on feet: Secondary | ICD-10-CM

## 2022-03-13 DIAGNOSIS — M6281 Muscle weakness (generalized): Secondary | ICD-10-CM

## 2022-03-13 NOTE — Therapy (Signed)
OUTPATIENT PHYSICAL THERAPY NEURO TREATMENT   Patient Name: Jim Carson MRN: [id number] DOB:[birth year], 71 y.o., male Today's Date: 03/13/2022   PCP: Tiana Loft, MD REFERRING PROVIDER: Lavena Bullion, MD   PT End of Session - 03/13/22 0852     Visit Number 5    Number of Visits 9    Date for PT Re-Evaluation 03/29/22    Authorization Type Medicare/AARP    PT Start Time 0845    PT Stop Time 0930    PT Time Calculation (min) 45 min    Equipment Utilized During Treatment Gait belt    Activity Tolerance Patient tolerated treatment well    Behavior During Therapy WFL for tasks assessed/performed              Past Medical History:  Diagnosis Date   Abnormal gait    due to parkinson's,  uses cane   Allergic rhinitis    Benign essential hypertension    Cancer (HCC)    hx of skin cancer   Chronic constipation    Deviated septum    GERD (gastroesophageal reflux disease)    History of multiple concussions    per pt as teen playing football, no residual   Hypothyroidism    followed by pcp   Left shoulder pain    OA (osteoarthritis)    OSA on CPAP    Parkinson disease [facility])    neurologist-- dr b. scott  '@[facility]  Neurology   Pneumonia    Vitamin B12 deficiency    Vitamin D deficiency    Wears glasses    Past Surgical History:  Procedure Laterality Date   APPENDECTOMY  07/1964   LAPAROSCOPIC CHOLECYSTECTOMY  01-21-2017   '@[facility] , [place]   POSTERIOR FUSION CERVICAL SPINE  08-15-2011   '@[facility] , [place]   w/ laminectomy and decompression-- C3 -- T1   POSTERIOR LAMINECTOMY / DECOMPRESSION LUMBAR SPINE  07-30-2013   [facility] , [place]   bilateral T11 - 12,  L2 --5   REMOVAL LEFT T1 PARASPINOUS MASS  02-12-2016   [facility] ,  [place]   desmoid fibromatosis   SHOULDER ARTHROSCOPY WITH ROTATOR CUFF REPAIR Left 12/15/2018   Procedure: SHOULDER ARTHROSCOPY, biceps tenodesis labored Debridement, submacromial decompression;  Surgeon: Sydnee Cabal, MD;  Location: [place];  Service: Orthopedics;  Laterality: Left;  interscalene block   TOTAL HIP ARTHROPLASTY Left 09/18/2017   '@[facility]'$ , [place]   TOTAL HIP REVISION Left 09/10/2021   Procedure: Left hip acetabular versus total hip arthroplasty revision, constrained liner;  Surgeon: Gaynelle Arabian, MD;  [facility];  Service: Orthopedics;  Laterality: Left;   Patient Active Problem List   Diagnosis Date Noted   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[place]) 09/10/2021   Recurrent dislocation of left hip 09/10/2021   Primary osteoarthritis of left hip 05/18/2021   PD (Parkinson's disease) ([place]) 05/18/2021   History of hypothyroidism 05/18/2021   History of gastroesophageal reflux (GERD) 05/18/2021   OSA (obstructive sleep apnea) 05/18/2021   Essential hypertension 05/18/2021    ONSET DATE: 02/14/2022 (MD referral)  REFERRING DIAG: Parkinson's disease  THERAPY DIAG:  Other abnormalities of gait and mobility  Unsteadiness on feet  Muscle weakness (generalized)  Other symptoms and signs involving the nervous system  Abnormal posture  Rationale for Evaluation and Treatment Rehabilitation  SUBJECTIVE:  SUBJECTIVE STATEMENT: No falls, feeling pretty good, busy with family Pt accompanied by: self  PERTINENT HISTORY: multiple hip surgery revisions, L hip (most recent 08/2021).  Additional PMH see above  PAIN:  No pain.  PRECAUTIONS: Fall and Other: Hx of L hip disclocation x 3 reps with bending/twisting motions  WEIGHT BEARING RESTRICTIONS No  FALLS: Has patient fallen in last 6 months? No, but pt reports near falls.  LIVING ENVIRONMENT: Lives with: lives with their spouse Lives in: House/apartment Stairs: Yes: Internal: 12 steps; on right going up Has following equipment at home: Walker - 4  wheeled and 3-wheeled walker; has R ankle brace  PLOF: Needs assistance with gait  PATIENT GOALS To be better balanced  OBJECTIVE:    TODAY'S TREATMENT: 03/13/22 Activity Comments  Seated PWR! Moves 10x Up, twist  PWR! Up standing 10x   Standing balance Feet wide/apart EO/EC; feet together EO/EC head turns  Foot on step 12" 3x15 sec No UE  Alt. Stair taps 12" x 2 min 5#  Standing hamstring curls 3x10 5#   Sidestepping x 2 min 5#   Alt. [place] taps x 5 reps, 1 round 5# ankle weights, 1 round no # BUE support, single UE, no UE  Standing calf stretch 3x60 sec w/ slantboard    TODAY'S TREATMENT: 03/11/2022 Activity Comments  NuStep, Level 3>5, 4 extremities x 9 min Cues to lessen lateral trunk movements; keeps SPM>90-100  Reviewed/performed corner balance exercises part of HEP: EO feet apart with head turns/nods EC feet apart 10 sec x 3 sec EO feet together 30 sec EC feet together head turns/nods Cues for posture to assist with balance  In parallel bars standing on Airex: Feet apart, EO head turns/nods x 10 Feet apart, EC head steady x 10 sec, 3 reps alt UE reaches x 10 reps Reaching across body x 10 reps each side  Light UE support, cues for quad/glut activation for full knee extension  PWR! Moves exercises:  PWR! Up x 5, PWR! Rock x 5, PWR! Step x 5 reps each side; then added use of Boomwhackers for increased balance challenge Cues for intermittent support, as pt has posterior LOB.  Cues for slowed pace of exercises thorughout for improved control)  Resisted forward gait in parallel bars, backwards gait no resistance, 3 reps, BUE support Cues for wider BOS  Use of rollator between activities in gym area, with discussion on gait safety.  Cues and min guard for posture, slowed pace with gait with rollator    PATIENT EDUCATION: Education details: Use of rollator at all times for safety with gait (pt walks in with umbrella for support today and PT discusses decreased stability and near  LOB with hastening of gait to NuStep). Person educated: Patient Education method: Explanation, Demonstration, and Verbal cues Education comprehension: verbalized understanding, returned demonstration, and verbal cues required  --------------------------------------------------------------------------------------- Objective measures from eval:   SENSATION: Light touch: impaired to light touch plantar aspect of R foot  COORDINATION: Decreased coordination RLE-knee hyperextension and weight on lateral aspect of R foot (from previous weakness R ankle due to back issues)  POSTURE: forward head and flexed trunk   LOWER EXTREMITY MMT:    MMT Right Eval Left Eval  Hip flexion 5 5  Hip extension    Hip abduction    Hip adduction    Hip internal rotation    Hip external rotation    Knee flexion 4+ 4+  Knee extension 5 5  Ankle dorsiflexion 4 5  Ankle  plantarflexion    Ankle inversion 4 5  Ankle eversion 3+ 5  (Blank rows = not tested)   TRANSFERS: Assistive device utilized: None  Sit to stand: SBA Stand to sit: SBA Retropulsion with initial attempts at sit<>stand   GAIT: Gait pattern: step through pattern, decreased step length- Right, decreased step length- Left, decreased stance time- Right, and trunk flexed Distance walked: 60 ft x 2 Assistive device utilized: Walker - 4 wheeled Level of assistance: SBA Comments: PT recommends that pt use rollator or 3-wheeled RW (in home) at all times  FUNCTIONAL TESTs:  5 times sit to stand: 13.31 sec Timed up and go (TUG): 14.72 sec TUG cognitive:  16.93 sec  M-CTSIB  Condition 1: Firm Surface, EO 30 Sec, Normal Sway  Condition 2: Firm Surface, EC 30 Sec, Mild Sway  Condition 3: Foam Surface, EO 30 Sec, Mild Sway  Condition 4: Foam Surface, EC 8.03 Sec, Severe Sway   Posterior LOB on heels with condition 4  Posterior push and release:  2-3 steps, needs therapist directly behind to prevent LOB Lateral push and release: > 3  steps to R and to L, therapist assist to recover Four square step test:  13.06 sec, min guard and tendency for posterior lean Gait velocity:  13.81 sec with 4-wheeled RW:  2.38 ft/sec   PATIENT EDUCATION: Education details: PT eval results, POC Person educated: Patient Education method: Explanation Education comprehension: verbalized understanding -------------------------------------------------------- HOME EXERCISE PROGRAM: Access Code: [email or web address] URL: [email or web address] Date: 03/06/2022 Prepared by: Sherlyn Lees  Exercises - Standing Balance in [place] with Eyes Closed  - 1 x daily - 7 x weekly - 3 reps - 10 sec hold - Corner Balance Feet Apart: Eyes Open With Head Turns  - 1 x daily - 7 x weekly - 3 reps - 10-30 sec hold - Corner Balance Feet Together With Eyes Open  - 1 x daily - 7 x weekly - 3 reps - 10-30 sec hold - Corner Balance Feet Together: Eyes Closed With Head Turns  - 1 x daily - 7 x weekly - 3 reps - 10-30 sec hold Access Code: [email or web address] Exercises - Squat with TRX  - 1 x daily - 7 x weekly - 3 sets - 10 reps - Curtsy Lunge with TRX  - 1 x daily - 7 x weekly - 3 sets - 5 reps - Assisted Lunge with TRX  - 1 x daily - 7 x weekly - 3 sets - 5 reps   GOALS: Goals reviewed with patient? Yes  SHORT TERM GOALS: = LTGs  LONG TERM GOALS: Target date: 03/29/2022  Pt will be independent with HEP for improved balance, strength, gait. Baseline:  Goal status: INITIAL  2.  Pt will improve TUG/TUG cognitive score to less than or equal to 10% sec for decreased fall risk.  Baseline: 14.72 sec, 16.93 sec Goal status: INITIAL  3.  Pt will improve performance on Condition 4 on MCTSIB, to at least 20 seconds moderate sway, for improved vestibular system use for balance. Baseline: 8.03 sec posterior LOB Goal status: INITIAL  4.  Pt will regain balance in posterior and lateral push/release testing in 2 steps or less without therapist assist to regain  balance. Baseline:  Goal status: INITIAL  ASSESSMENT:  CLINICAL IMPRESSION: Session focus on performance of large amplitude and alternating movements to improve coordination and agility. Continued with activities of varying resistance to encourage control and coordination to improve finesse and selective movements. Continued sessions to progress  coordination, balance and motor control to reduce risk for falls  OBJECTIVE IMPAIRMENTS Abnormal gait, decreased balance, decreased mobility, difficulty walking, decreased strength, and postural dysfunction.   ACTIVITY LIMITATIONS transfers and locomotion level  PARTICIPATION LIMITATIONS: community activity  PERSONAL FACTORS Past/current experiences and 3+ comorbidities: see previous PMH-above  are also affecting patient's functional outcome.   REHAB POTENTIAL: Good  CLINICAL DECISION MAKING: Evolving/moderate complexity  EVALUATION COMPLEXITY: Moderate  PLAN: PT FREQUENCY: 2x/week  PT DURATION: 4 weeks  PLANNED INTERVENTIONS: Therapeutic exercises, Therapeutic activity, Neuromuscular re-education, Balance training, Gait training, Patient/Family education, Self Care, Orthotic/Fit training, and DME instructions  PLAN FOR NEXT SESSION: Continue balance on foam, resisted gait in forward and backwards direction.     Toniann Fail, PT 03/13/2022, 9:29 AM  [facility] at [facility] [place], [place], [place] [address] Phone # [phone] Fax # [phone]

## 2022-03-18 ENCOUNTER — Ambulatory Visit: Payer: Medicare Other | Admitting: Physical Therapy

## 2022-03-18 DIAGNOSIS — R29818 Other symptoms and signs involving the nervous system: Secondary | ICD-10-CM

## 2022-03-18 DIAGNOSIS — R2689 Other abnormalities of gait and mobility: Secondary | ICD-10-CM | POA: Diagnosis not present

## 2022-03-18 DIAGNOSIS — R2681 Unsteadiness on feet: Secondary | ICD-10-CM

## 2022-03-18 DIAGNOSIS — M6281 Muscle weakness (generalized): Secondary | ICD-10-CM

## 2022-03-18 NOTE — Therapy (Signed)
OUTPATIENT PHYSICAL THERAPY NEURO TREATMENT   Patient Name: Jim Carson MRN: [id number] DOB:1950/12/29, 71 y.o., male Today's Date: 03/18/2022   PCP: Tiana Loft, MD REFERRING PROVIDER: Lavena Bullion, MD   PT End of Session - 03/18/22 0841     Visit Number 6    Number of Visits 9    Date for PT Re-Evaluation 03/29/22    Authorization Type Medicare/AARP    PT Start Time 0844    PT Stop Time 0926    PT Time Calculation (min) 42 min    Equipment Utilized During Treatment Gait belt    Activity Tolerance Patient tolerated treatment well    Behavior During Therapy WFL for tasks assessed/performed               Past Medical History:  Diagnosis Date   Abnormal gait    due to parkinson's,  uses cane   Allergic rhinitis    Benign essential hypertension    Cancer (HCC)    hx of skin cancer   Chronic constipation    Deviated septum    GERD (gastroesophageal reflux disease)    History of multiple concussions    per pt as teen playing football, no residual   Hypothyroidism    followed by pcp   Left shoulder pain    OA (osteoarthritis)    OSA on CPAP    Parkinson disease [facility])    neurologist-- dr b. Nicki Reaper  '@[facility]'$  Neurology   Pneumonia    Vitamin B12 deficiency    Vitamin D deficiency    Wears glasses    Past Surgical History:  Procedure Laterality Date   APPENDECTOMY  07/1964   LAPAROSCOPIC CHOLECYSTECTOMY  01-21-2017   '@[facility] , [place]   POSTERIOR FUSION CERVICAL SPINE  08-15-2011   '@[facility] , [place]   w/ laminectomy and decompression-- C3 -- T1   POSTERIOR LAMINECTOMY / DECOMPRESSION LUMBAR SPINE  07-30-2013   [facility] , [place]   bilateral T11 - 12,  L2 --5   REMOVAL LEFT T1 PARASPINOUS MASS  02-12-2016   [facility] ,  [place]   desmoid fibromatosis   SHOULDER ARTHROSCOPY WITH ROTATOR CUFF REPAIR Left 12/15/2018   Procedure: SHOULDER ARTHROSCOPY, biceps tenodesis labored Debridement, submacromial decompression;  Surgeon: Sydnee Cabal, MD;  Location: [place];  Service: Orthopedics;  Laterality: Left;  interscalene block   TOTAL HIP ARTHROPLASTY Left 09/18/2017   '@[facility] , [place]   TOTAL HIP REVISION Left 09/10/2021   Procedure: Left hip acetabular versus total hip arthroplasty revision, constrained liner;  Surgeon: Gaynelle Arabian, MD;  [facility];  Service: Orthopedics;  Laterality: Left;   Patient Active Problem List   Diagnosis Date Noted   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[place]) 09/10/2021   Recurrent dislocation of left hip 09/10/2021   Primary osteoarthritis of left hip 05/18/2021   PD (Parkinson's disease) ([place]) 05/18/2021   History of hypothyroidism 05/18/2021   History of gastroesophageal reflux (GERD) 05/18/2021   OSA (obstructive sleep apnea) 05/18/2021   Essential hypertension 05/18/2021    ONSET DATE: 02/14/2022 (MD referral)  REFERRING DIAG: Parkinson's disease  THERAPY DIAG:  Other abnormalities of gait and mobility  Unsteadiness on feet  Muscle weakness (generalized)  Other symptoms and signs involving the nervous system  Rationale for Evaluation and Treatment Rehabilitation  SUBJECTIVE:  SUBJECTIVE STATEMENT: Nothing new.  No falls. Pt accompanied by: self  PERTINENT HISTORY: multiple hip surgery revisions, L hip (most recent 08/2021).  Additional PMH see above  PAIN:  No pain.  PRECAUTIONS: Fall and Other: Hx of L hip disclocation x 3 reps with bending/twisting motions  WEIGHT BEARING RESTRICTIONS No  FALLS: Has patient fallen in last 6 months? No, but pt reports near falls.  LIVING ENVIRONMENT: Lives with: lives with their spouse Lives in: House/apartment Stairs: Yes: Internal: 12 steps; on right going up Has following equipment at home: Walker - 4 wheeled and 3-wheeled walker; has R ankle  brace  PLOF: Needs assistance with gait  PATIENT GOALS To be better balanced  OBJECTIVE:        TODAY'S TREATMENT: 03/18/2022 Activity Comments  Warm up activity:  sit to stand with PWR! Up x 5, sit<>stand with PWR! Rock x 5, sit<>stand with PWR! Twist x 5, sit<>stand with PWR! Step x 5  For motor control plus dual task activity, min guard >min assist due to posterior lean at times  Hip strategy work in parallel bars, 2 x 10 reps, bumping hips posteriorly on bar, then recovering to midline Light UE support  In parallel bars standing on Airex: Feet apart/then partial tandem, EO head turns/nods x 10 Feet apart/then partial tandem, EC head steady x 15 sec, 3 reps Light UE support, cues for quad/glut activation for full knee extension  Resisted sidestepping in parallel bars, blue theraband Cues for upright posture, visual target  Resisted forward gait in parallel bars, backwards gait no resistance, 5 reps, BUE support Cues for wider BOS  Standing on solid surface, then Airex:  posterior step and recover to midline, 10 reps each side UE support and cues for controlled step length  Clock face/circle stepping activity to varied targets Light UE support at locked rollator  Use of rollator between activities in gym area.  Cues and min guard for posture, slowed pace with gait with rollator    PATIENT EDUCATION: Education details: SLOW, CONTROLLED movement patterns Person educated: Patient Education method: Explanation, Demonstration, and Verbal cues Education comprehension: verbalized understanding, returned demonstration, and verbal cues required  --------------------------------------------------------------------------------------- Objective measures from eval:   SENSATION: Light touch: impaired to light touch plantar aspect of R foot  COORDINATION: Decreased coordination RLE-knee hyperextension and weight on lateral aspect of R foot (from previous weakness R ankle due to back  issues)  POSTURE: forward head and flexed trunk   LOWER EXTREMITY MMT:    MMT Right Eval Left Eval  Hip flexion 5 5  Hip extension    Hip abduction    Hip adduction    Hip internal rotation    Hip external rotation    Knee flexion 4+ 4+  Knee extension 5 5  Ankle dorsiflexion 4 5  Ankle plantarflexion    Ankle inversion 4 5  Ankle eversion 3+ 5  (Blank rows = not tested)   TRANSFERS: Assistive device utilized: None  Sit to stand: SBA Stand to sit: SBA Retropulsion with initial attempts at sit<>stand   GAIT: Gait pattern: step through pattern, decreased step length- Right, decreased step length- Left, decreased stance time- Right, and trunk flexed Distance walked: 60 ft x 2 Assistive device utilized: Walker - 4 wheeled Level of assistance: SBA Comments: PT recommends that pt use rollator or 3-wheeled RW (in home) at all times  FUNCTIONAL TESTs:  5 times sit to stand: 13.31 sec Timed up and go (TUG): 14.72 sec TUG cognitive:  16.93 sec  M-CTSIB  Condition 1: Firm Surface, EO 30 Sec, Normal Sway  Condition 2: Firm Surface, EC 30 Sec, Mild Sway  Condition 3: Foam Surface, EO 30 Sec, Mild Sway  Condition 4: Foam Surface, EC 8.03 Sec, Severe Sway   Posterior LOB on heels with condition 4  Posterior push and release:  2-3 steps, needs therapist directly behind to prevent LOB Lateral push and release: > 3 steps to R and to L, therapist assist to recover Four square step test:  13.06 sec, min guard and tendency for posterior lean Gait velocity:  13.81 sec with 4-wheeled RW:  2.38 ft/sec   PATIENT EDUCATION: Education details: PT eval results, POC Person educated: Patient Education method: Explanation Education comprehension: verbalized understanding -------------------------------------------------------- HOME EXERCISE PROGRAM: Access Code: [email or web address] URL: [email or web address] Date: 03/06/2022 Prepared by: Sherlyn Lees  Exercises - Standing  Balance in [place] with Eyes Closed  - 1 x daily - 7 x weekly - 3 reps - 10 sec hold - Corner Balance Feet Apart: Eyes Open With Head Turns  - 1 x daily - 7 x weekly - 3 reps - 10-30 sec hold - Corner Balance Feet Together With Eyes Open  - 1 x daily - 7 x weekly - 3 reps - 10-30 sec hold - Corner Balance Feet Together: Eyes Closed With Head Turns  - 1 x daily - 7 x weekly - 3 reps - 10-30 sec hold Access Code: [email or web address] Exercises - Squat with TRX  - 1 x daily - 7 x weekly - 3 sets - 10 reps - Curtsy Lunge with TRX  - 1 x daily - 7 x weekly - 3 sets - 5 reps - Assisted Lunge with TRX  - 1 x daily - 7 x weekly - 3 sets - 5 reps   GOALS: Goals reviewed with patient? Yes  SHORT TERM GOALS: = LTGs  LONG TERM GOALS: Target date: 03/29/2022  Pt will be independent with HEP for improved balance, strength, gait. Baseline:  Goal status: INITIAL  2.  Pt will improve TUG/TUG cognitive score to less than or equal to 10% sec for decreased fall risk.  Baseline: 14.72 sec, 16.93 sec Goal status: INITIAL  3.  Pt will improve performance on Condition 4 on MCTSIB, to at least 20 seconds moderate sway, for improved vestibular system use for balance. Baseline: 8.03 sec posterior LOB Goal status: INITIAL  4.  Pt will regain balance in posterior and lateral push/release testing in 2 steps or less without therapist assist to regain balance. Baseline:  Goal status: INITIAL  ASSESSMENT:  CLINICAL IMPRESSION: Continued skilled PT session today on balance recovery strategies and balance on compliant surfaces.  With balance recovery exercises in posterior direction, pt requires cues for control, slowed pace, and decreased step length.  He continues to require UE support for compliant surface balance work.  He will continue to benefit from skilled PT sessions to progress coordination, balance and motor control to reduce risk for falls  OBJECTIVE IMPAIRMENTS Abnormal gait, decreased balance, decreased  mobility, difficulty walking, decreased strength, and postural dysfunction.   ACTIVITY LIMITATIONS transfers and locomotion level  PARTICIPATION LIMITATIONS: community activity  PERSONAL FACTORS Past/current experiences and 3+ comorbidities: see previous PMH-above  are also affecting patient's functional outcome.   REHAB POTENTIAL: Good  CLINICAL DECISION MAKING: Evolving/moderate complexity  EVALUATION COMPLEXITY: Moderate  PLAN: PT FREQUENCY: 2x/week  PT DURATION: 4 weeks  PLANNED INTERVENTIONS: Therapeutic exercises, Therapeutic activity, Neuromuscular re-education, Balance training, Gait training, Patient/Family  education, Self Care, Orthotic/Fit training, and DME instructions  PLAN FOR NEXT SESSION: Posterior balance recovery, step strategy work; Continue balance on foam, resisted gait in forward and backwards direction.     Frazier Butt., PT 03/18/2022, 9:29 AM  [facility] at [facility] [place], [place], [place] [address] Phone # [phone] Fax # [phone]

## 2022-03-20 ENCOUNTER — Ambulatory Visit: Payer: Medicare Other | Admitting: Physical Therapy

## 2022-03-20 ENCOUNTER — Encounter: Payer: Self-pay | Admitting: Physical Therapy

## 2022-03-20 DIAGNOSIS — R29818 Other symptoms and signs involving the nervous system: Secondary | ICD-10-CM

## 2022-03-20 DIAGNOSIS — R2689 Other abnormalities of gait and mobility: Secondary | ICD-10-CM

## 2022-03-20 DIAGNOSIS — M6281 Muscle weakness (generalized): Secondary | ICD-10-CM

## 2022-03-20 DIAGNOSIS — R2681 Unsteadiness on feet: Secondary | ICD-10-CM

## 2022-03-20 NOTE — Therapy (Signed)
OUTPATIENT PHYSICAL THERAPY NEURO TREATMENT   Patient Name: Jim Carson MRN: [id number] DOB:08-22-1950, 71 y.o., male Today's Date: 03/20/2022   PCP: Tiana Loft, MD REFERRING PROVIDER: Lavena Bullion, MD   PT End of Session - 03/20/22 0853     Visit Number 7    Number of Visits 9    Date for PT Re-Evaluation 03/29/22    Authorization Type Medicare/AARP    PT Start Time 0848    PT Stop Time 0930    PT Time Calculation (min) 42 min    Equipment Utilized During Treatment Gait belt    Activity Tolerance Patient tolerated treatment well    Behavior During Therapy WFL for tasks assessed/performed                Past Medical History:  Diagnosis Date   Abnormal gait    due to parkinson's,  uses cane   Allergic rhinitis    Benign essential hypertension    Cancer (HCC)    hx of skin cancer   Chronic constipation    Deviated septum    GERD (gastroesophageal reflux disease)    History of multiple concussions    per pt as teen playing football, no residual   Hypothyroidism    followed by pcp   Left shoulder pain    OA (osteoarthritis)    OSA on CPAP    Parkinson disease [facility])    neurologist-- dr b. Nicki Reaper  '@[facility]  Neurology   Pneumonia    Vitamin B12 deficiency    Vitamin D deficiency    Wears glasses    Past Surgical History:  Procedure Laterality Date   APPENDECTOMY  07/1964   LAPAROSCOPIC CHOLECYSTECTOMY  01-21-2017   '@[facility] , [place]   POSTERIOR FUSION CERVICAL SPINE  08-15-2011   '@[facility]'$ , [place]   w/ laminectomy and decompression-- C3 -- T1   POSTERIOR LAMINECTOMY / DECOMPRESSION LUMBAR SPINE  07-30-2013   [facility] , [place]   bilateral T11 - 12,  L2 --5   REMOVAL LEFT T1 PARASPINOUS MASS  02-12-2016   [facility] ,  [place]   desmoid fibromatosis   SHOULDER ARTHROSCOPY WITH ROTATOR CUFF REPAIR Left 12/15/2018   Procedure: SHOULDER ARTHROSCOPY, biceps tenodesis labored Debridement, submacromial decompression;  Surgeon: Sydnee Cabal, MD;  Location: [place];  Service: Orthopedics;  Laterality: Left;  interscalene block   TOTAL HIP ARTHROPLASTY Left 09/18/2017   '@[facility] , [place]   TOTAL HIP REVISION Left 09/10/2021   Procedure: Left hip acetabular versus total hip arthroplasty revision, constrained liner;  Surgeon: Gaynelle Arabian, MD;  Location: [facility];  Service: Orthopedics;  Laterality: Left;   Patient Active Problem List   Diagnosis Date Noted   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[place]) 09/10/2021   Recurrent dislocation of left hip 09/10/2021   Primary osteoarthritis of left hip 05/18/2021   PD (Parkinson's disease) ([place]) 05/18/2021   History of hypothyroidism 05/18/2021   History of gastroesophageal reflux (GERD) 05/18/2021   OSA (obstructive sleep apnea) 05/18/2021   Essential hypertension 05/18/2021    ONSET DATE: 02/14/2022 (MD referral)  REFERRING DIAG: Parkinson's disease  THERAPY DIAG:  Other abnormalities of gait and mobility  Unsteadiness on feet  Muscle weakness (generalized)  Other symptoms and signs involving the nervous system  Rationale for Evaluation and Treatment Rehabilitation  SUBJECTIVE:  SUBJECTIVE STATEMENT: Not really any changes.  My rollator is in the other car.  Walked in with cane today. Pt accompanied by: self  PERTINENT HISTORY: multiple hip surgery revisions, L hip (most recent 08/2021).  Additional PMH see above  PAIN:  No pain.  PRECAUTIONS: Fall and Other: Hx of L hip disclocation x 3 reps with bending/twisting motions  WEIGHT BEARING RESTRICTIONS No  FALLS: Has patient fallen in last 6 months? No, but pt reports near falls.  LIVING ENVIRONMENT: Lives with: lives with their spouse Lives in: House/apartment Stairs: Yes: Internal: 12 steps; on right going up Has following equipment at  home: Walker - 4 wheeled and 3-wheeled walker; has R ankle brace  PLOF: Needs assistance with gait  PATIENT GOALS To be better balanced  OBJECTIVE:   Pt noted to have significant R knee hyperextension with gait, wearing R Richie brace.  He reports having it done by [facility] in [place] several years ago.  Discussed and trialed heelwedge for additional positioning.   PATIENT EDUCATION: Education details: Requested pt follow up with [facility] to look at adjusting brace to try to control knee hyperextension.  PT recommends pt use rollator at all times and use upper extremity support during all exercises to lessen risk of falls.  Person educated: Patient Education method: Explanation and Demonstration Education comprehension: verbalized understanding and needs further education     TODAY'S TREATMENT: 03/20/2022 Activity Comments  Warm up activity:  sit to stand with PWR! Up x 5, sit<>stand with PWR! Rock x 5, sit<>stand with PWR! Twist x 5, sit<>stand with PWR! Step x 5  For motor control plus dual task activity, min guard >min assist due to posterior lean at times  Gait 160 ft with rollator, wearing Richie brace and heel wedge Slightly less R knee recurvatum, cues for hip/knee flexion for initial R foot clearance  Hip strategy work in parallel bars, 2 x 10 reps, bumping hips posteriorly on bar, then recovering to midline Light UE support  Step strategy for balance: -side step and return to midline x 10; 2nd set x 10 with stepping over hurdle -back step and return to midline x 10 -forward step and return to midline x 10, 2nd set x 10 with stepping over hurdle Pt reports performing at home  Clock-step activity for varied direction stepping   Forward/back walking in parallel bars Cues to hinge at hips  sidestepping in parallel bars Cues for upright posture, visual target  Gait at end of session, continuing to wear wedge R heel, using rollator out to car Continued cues for staying close to  rollator    --------------------------------------------------------------------------------------- Objective measures from eval:   SENSATION: Light touch: impaired to light touch plantar aspect of R foot  COORDINATION: Decreased coordination RLE-knee hyperextension and weight on lateral aspect of R foot (from previous weakness R ankle due to back issues)  POSTURE: forward head and flexed trunk   LOWER EXTREMITY MMT:    MMT Right Eval Left Eval  Hip flexion 5 5  Hip extension    Hip abduction    Hip adduction    Hip internal rotation    Hip external rotation    Knee flexion 4+ 4+  Knee extension 5 5  Ankle dorsiflexion 4 5  Ankle plantarflexion    Ankle inversion 4 5  Ankle eversion 3+ 5  (Blank rows = not tested)   TRANSFERS: Assistive device utilized: None  Sit to stand: SBA Stand to sit: SBA Retropulsion with initial attempts  at sit<>stand   GAIT: Gait pattern: step through pattern, decreased step length- Right, decreased step length- Left, decreased stance time- Right, and trunk flexed Distance walked: 60 ft x 2 Assistive device utilized: Walker - 4 wheeled Level of assistance: SBA Comments: PT recommends that pt use rollator or 3-wheeled RW (in home) at all times  FUNCTIONAL TESTs:  5 times sit to stand: 13.31 sec Timed up and go (TUG): 14.72 sec TUG cognitive:  16.93 sec  M-CTSIB  Condition 1: Firm Surface, EO 30 Sec, Normal Sway  Condition 2: Firm Surface, EC 30 Sec, Mild Sway  Condition 3: Foam Surface, EO 30 Sec, Mild Sway  Condition 4: Foam Surface, EC 8.03 Sec, Severe Sway   Posterior LOB on heels with condition 4  Posterior push and release:  2-3 steps, needs therapist directly behind to prevent LOB Lateral push and release: > 3 steps to R and to L, therapist assist to recover Four square step test:  13.06 sec, min guard and tendency for posterior lean Gait velocity:  13.81 sec with 4-wheeled RW:  2.38 ft/sec   PATIENT EDUCATION: Education  details: PT eval results, POC Person educated: Patient Education method: Explanation Education comprehension: verbalized understanding -------------------------------------------------------- HOME EXERCISE PROGRAM: Access Code: [email or web address] URL: [email or web address] Date: 03/06/2022 Prepared by: Sherlyn Lees  Exercises - Standing Balance in [place] with Eyes Closed  - 1 x daily - 7 x weekly - 3 reps - 10 sec hold - Corner Balance Feet Apart: Eyes Open With Head Turns  - 1 x daily - 7 x weekly - 3 reps - 10-30 sec hold - Corner Balance Feet Together With Eyes Open  - 1 x daily - 7 x weekly - 3 reps - 10-30 sec hold - Corner Balance Feet Together: Eyes Closed With Head Turns  - 1 x daily - 7 x weekly - 3 reps - 10-30 sec hold Access Code: [email or web address] Exercises - Squat with TRX  - 1 x daily - 7 x weekly - 3 sets - 10 reps - Curtsy Lunge with TRX  - 1 x daily - 7 x weekly - 3 sets - 5 reps - Assisted Lunge with TRX  - 1 x daily - 7 x weekly - 3 sets - 5 reps   GOALS: Goals reviewed with patient? Yes  SHORT TERM GOALS: = LTGs  LONG TERM GOALS: Target date: 03/29/2022  Pt will be independent with HEP for improved balance, strength, gait. Baseline:  Goal status: INITIAL  2.  Pt will improve TUG/TUG cognitive score to less than or equal to 10% sec for decreased fall risk.  Baseline: 14.72 sec, 16.93 sec Goal status: INITIAL  3.  Pt will improve performance on Condition 4 on MCTSIB, to at least 20 seconds moderate sway, for improved vestibular system use for balance. Baseline: 8.03 sec posterior LOB Goal status: INITIAL  4.  Pt will regain balance in posterior and lateral push/release testing in 2 steps or less without therapist assist to regain balance. Baseline:  Goal status: INITIAL  ASSESSMENT:  CLINICAL IMPRESSION: Continued skilled PT work on balance recovery as well as foot clearance with focus on control, paced movement patterns.  Pt does not have rollator  with him coming into therapy, and he uses cane, with very significant R knee recurvatum noted, despite wearing R Richie AFO.  Trialed small heel wedge to try to lessen recurvatum and discussed pt trying to contact his orthotist to see if adjustments can be made in his AFO.  He is performing stepping strategy exercises already at home and PT recommends continuing to perform with UE support.  Have educated patient that he needs UE support with all his exercises and he needs rollator at all times for safety with gait.  Plans are to complete this POC, working towards LTGs to improve overall functional mobility and safety with mobility.  OBJECTIVE IMPAIRMENTS Abnormal gait, decreased balance, decreased mobility, difficulty walking, decreased strength, and postural dysfunction.   ACTIVITY LIMITATIONS transfers and locomotion level  PARTICIPATION LIMITATIONS: community activity  PERSONAL FACTORS Past/current experiences and 3+ comorbidities: see previous PMH-above  are also affecting patient's functional outcome.   REHAB POTENTIAL: Good  CLINICAL DECISION MAKING: Evolving/moderate complexity  EVALUATION COMPLEXITY: Moderate  PLAN: PT FREQUENCY: 2x/week  PT DURATION: 4 weeks  PLANNED INTERVENTIONS: Therapeutic exercises, Therapeutic activity, Neuromuscular re-education, Balance training, Gait training, Patient/Family education, Self Care, Orthotic/Fit training, and DME instructions  PLAN FOR NEXT SESSION: Work towards LTGs; make any updates to HEP as needed (he is already performing step strategies); plan for d/c next week.  Posterior balance recovery, step strategy work; Continue balance on foam, resisted gait in forward and backwards direction.     Frazier Butt., PT 03/20/2022, 1:03 PM  [facility] at [facility] [address], [place], [place] [address] Phone # [phone] Fax # [phone]

## 2022-03-25 ENCOUNTER — Ambulatory Visit: Payer: Medicare Other | Admitting: Physical Therapy

## 2022-03-25 ENCOUNTER — Ambulatory Visit: Payer: Medicare Other

## 2022-03-25 ENCOUNTER — Ambulatory Visit (INDEPENDENT_AMBULATORY_CARE_PROVIDER_SITE_OTHER): Payer: Medicare Other | Admitting: Podiatry

## 2022-03-25 ENCOUNTER — Encounter: Payer: Self-pay | Admitting: Physical Therapy

## 2022-03-25 DIAGNOSIS — M21371 Foot drop, right foot: Secondary | ICD-10-CM

## 2022-03-25 DIAGNOSIS — R2689 Other abnormalities of gait and mobility: Secondary | ICD-10-CM

## 2022-03-25 DIAGNOSIS — M7751 Other enthesopathy of right foot: Secondary | ICD-10-CM | POA: Diagnosis not present

## 2022-03-25 DIAGNOSIS — M19071 Primary osteoarthritis, right ankle and foot: Secondary | ICD-10-CM | POA: Diagnosis not present

## 2022-03-25 DIAGNOSIS — R2681 Unsteadiness on feet: Secondary | ICD-10-CM

## 2022-03-25 DIAGNOSIS — M6281 Muscle weakness (generalized): Secondary | ICD-10-CM

## 2022-03-25 DIAGNOSIS — M79671 Pain in right foot: Secondary | ICD-10-CM

## 2022-03-25 NOTE — Therapy (Signed)
OUTPATIENT PHYSICAL THERAPY NEURO TREATMENT   Patient Name: Jim Carson MRN: [id number] DOB:September 25, 1950, 71 y.o., male Today's Date: 03/25/2022   PCP: Tiana Loft, MD REFERRING PROVIDER: Lavena Bullion, MD   PT End of Session - 03/25/22 0846     Visit Number 8    Number of Visits 9    Date for PT Re-Evaluation 03/29/22    Authorization Type Medicare/AARP    PT Start Time 0847    PT Stop Time 0927    PT Time Calculation (min) 40 min    Equipment Utilized During Treatment Gait belt    Activity Tolerance Patient tolerated treatment well    Behavior During Therapy WFL for tasks assessed/performed                 Past Medical History:  Diagnosis Date   Abnormal gait    due to parkinson's,  uses cane   Allergic rhinitis    Benign essential hypertension    Cancer (HCC)    hx of skin cancer   Chronic constipation    Deviated septum    GERD (gastroesophageal reflux disease)    History of multiple concussions    per pt as teen playing football, no residual   Hypothyroidism    followed by pcp   Left shoulder pain    OA (osteoarthritis)    OSA on CPAP    Parkinson disease [facility])    neurologist-- dr b. Nicki Reaper  '@[facility]  Neurology   Pneumonia    Vitamin B12 deficiency    Vitamin D deficiency    Wears glasses    Past Surgical History:  Procedure Laterality Date   APPENDECTOMY  07/1964   LAPAROSCOPIC CHOLECYSTECTOMY  01-21-2017   '@[facility] , [place]   POSTERIOR FUSION CERVICAL SPINE  08-15-2011   '@[facility]'$ , [place]   w/ laminectomy and decompression-- C3 -- T1   POSTERIOR LAMINECTOMY / DECOMPRESSION LUMBAR SPINE  07-30-2013   [facility] , [place]   bilateral T11 - 12,  L2 --5   REMOVAL LEFT T1 PARASPINOUS MASS  02-12-2016   [facility] ,  [place]   desmoid fibromatosis   SHOULDER ARTHROSCOPY WITH ROTATOR CUFF REPAIR Left 12/15/2018   Procedure: SHOULDER ARTHROSCOPY, biceps tenodesis labored Debridement, submacromial decompression;  Surgeon: Sydnee Cabal, MD;  Location: [place];  Service: Orthopedics;  Laterality: Left;  interscalene block   TOTAL HIP ARTHROPLASTY Left 09/18/2017   '@[facility]'$ , [place]   TOTAL HIP REVISION Left 09/10/2021   Procedure: Left hip acetabular versus total hip arthroplasty revision, constrained liner;  Surgeon: Gaynelle Arabian, MD;  [facility];  Service: Orthopedics;  Laterality: Left;   Patient Active Problem List   Diagnosis Date Noted   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[place]) 09/10/2021   Recurrent dislocation of left hip 09/10/2021   Primary osteoarthritis of left hip 05/18/2021   PD (Parkinson's disease) ([place]) 05/18/2021   History of hypothyroidism 05/18/2021   History of gastroesophageal reflux (GERD) 05/18/2021   OSA (obstructive sleep apnea) 05/18/2021   Essential hypertension 05/18/2021    ONSET DATE: 02/14/2022 (MD referral)  REFERRING DIAG: Parkinson's disease  THERAPY DIAG:  Other abnormalities of gait and mobility  Unsteadiness on feet  Muscle weakness (generalized)  Rationale for Evaluation and Treatment Rehabilitation  SUBJECTIVE:  SUBJECTIVE STATEMENT: Have appt for orthotist this morning to check out the brace. Pt accompanied by: self  PERTINENT HISTORY: multiple hip surgery revisions, L hip (most recent 08/2021).  Additional PMH see above  PAIN:  No pain.  PRECAUTIONS: Fall and Other: Hx of L hip disclocation x 3 reps with bending/twisting motions  WEIGHT BEARING RESTRICTIONS No  FALLS: Has patient fallen in last 6 months? No, but pt reports near falls.  LIVING ENVIRONMENT: Lives with: lives with their spouse Lives in: House/apartment Stairs: Yes: Internal: 12 steps; on right going up Has following equipment at home: Walker - 4 wheeled and 3-wheeled walker; has R ankle brace  PLOF: Needs  assistance with gait  PATIENT GOALS To be better balanced  OBJECTIVE:    TODAY'S TREATMENT: 03/25/2022 Activity Comments  Standing PWR! Moves at counter: PWR! Up x 10 reps PWR! Rock x 10 with 1 UE support PWR! Twist-modified with open arm, hold to counter PWR! Step-side x 10 reps Cues for control, keep UE support at counter  Step strategy for balance: -side step and return to midline x 10 with stepping over hurdle -back step and return to midline x 10 -forward step and return to midline x 10, 2nd set x 10 with stepping over hurdle   Varied direction stepping, multi-directions with clock-step activity at counter Good control with initial cues  Forward/back walking using rollator, 5 reps Cues initially for smaller steps in backward direction  Reviewed HEP on foam: -feet apart with EO/EC-head turns with EO -feet together with EO/EC-went to standing on solid surface as well Cues for UE support for stability with exercises  Sit<>stand 3 x 5 reps from mat surface, cues for hold/pause with stand and with sit for improved control Good control with initial cues         --------------------------------------------------------------------------------------- Objective measures from eval:   SENSATION: Light touch: impaired to light touch plantar aspect of R foot  COORDINATION: Decreased coordination RLE-knee hyperextension and weight on lateral aspect of R foot (from previous weakness R ankle due to back issues)  POSTURE: forward head and flexed trunk   LOWER EXTREMITY MMT:    MMT Right Eval Left Eval  Hip flexion 5 5  Hip extension    Hip abduction    Hip adduction    Hip internal rotation    Hip external rotation    Knee flexion 4+ 4+  Knee extension 5 5  Ankle dorsiflexion 4 5  Ankle plantarflexion    Ankle inversion 4 5  Ankle eversion 3+ 5  (Blank rows = not tested)   TRANSFERS: Assistive device utilized: None  Sit to stand: SBA Stand to sit: SBA Retropulsion  with initial attempts at sit<>stand   GAIT: Gait pattern: step through pattern, decreased step length- Right, decreased step length- Left, decreased stance time- Right, and trunk flexed Distance walked: 60 ft x 2 Assistive device utilized: Walker - 4 wheeled Level of assistance: SBA Comments: PT recommends that pt use rollator or 3-wheeled RW (in home) at all times  FUNCTIONAL TESTs:  5 times sit to stand: 13.31 sec Timed up and go (TUG): 14.72 sec TUG cognitive:  16.93 sec  M-CTSIB  Condition 1: Firm Surface, EO 30 Sec, Normal Sway  Condition 2: Firm Surface, EC 30 Sec, Mild Sway  Condition 3: Foam Surface, EO 30 Sec, Mild Sway  Condition 4: Foam Surface, EC 8.03 Sec, Severe Sway   Posterior LOB on heels with condition 4  Posterior push and release:  2-3 steps, needs therapist directly behind to prevent LOB Lateral push and release: > 3 steps to R and to L, therapist assist to recover Four square step test:  13.06 sec, min guard and tendency for posterior lean Gait velocity:  13.81 sec with 4-wheeled RW:  2.38 ft/sec   PATIENT EDUCATION: Education details: PT eval results, POC Person educated: Patient Education method: Explanation Education comprehension: verbalized understanding -------------------------------------------------------- HOME EXERCISE PROGRAM: Access Code: [email or web address] URL: [email or web address] Date: 03/06/2022 Prepared by: Sherlyn Lees  Exercises - Standing Balance in [place] with Eyes Closed  - 1 x daily - 7 x weekly - 3 reps - 10 sec hold - Corner Balance Feet Apart: Eyes Open With Head Turns  - 1 x daily - 7 x weekly - 3 reps - 10-30 sec hold - Corner Balance Feet Together With Eyes Open  - 1 x daily - 7 x weekly - 3 reps - 10-30 sec hold - Corner Balance Feet Together: Eyes Closed With Head Turns  - 1 x daily - 7 x weekly - 3 reps - 10-30 sec hold Access Code: [email or web address] Exercises - Squat with TRX  - 1 x daily - 7 x weekly - 3 sets - 10  reps - Curtsy Lunge with TRX  - 1 x daily - 7 x weekly - 3 sets - 5 reps - Assisted Lunge with TRX  - 1 x daily - 7 x weekly - 3 sets - 5 reps   GOALS: Goals reviewed with patient? Yes  SHORT TERM GOALS: = LTGs  LONG TERM GOALS: Target date: 03/29/2022  Pt will be independent with HEP for improved balance, strength, gait. Baseline:  Goal status: INTIAL  2.  Pt will improve TUG/TUG cognitive score to less than or equal to 10% sec for decreased fall risk.  Baseline: 14.72 sec, 16.93 sec Goal status: INITIAL  3.  Pt will improve performance on Condition 4 on MCTSIB, to at least 20 seconds moderate sway, for improved vestibular system use for balance. Baseline: 8.03 sec posterior LOB Goal status: INITIAL  4.  Pt will regain balance in posterior and lateral push/release testing in 2 steps or less without therapist assist to regain balance. Baseline:  Goal status: INITIAL  ASSESSMENT:  CLINICAL IMPRESSION: Skilled PT session focused on balance recovery strategies particularly in the posterior and lateral direction.  Performed today at counter.  Also worked on compliant surfaces, with pt having significant LOB with eyes closed; advised to perform these only at home with UE support due to decreased stability without UE support.  Focus has been on safety, stability, control of movement patterns, and plan is for d/c next visit.  OBJECTIVE IMPAIRMENTS Abnormal gait, decreased balance, decreased mobility, difficulty walking, decreased strength, and postural dysfunction.   ACTIVITY LIMITATIONS transfers and locomotion level  PARTICIPATION LIMITATIONS: community activity  PERSONAL FACTORS Past/current experiences and 3+ comorbidities: see previous PMH-above  are also affecting patient's functional outcome.   REHAB POTENTIAL: Good  CLINICAL DECISION MAKING: Evolving/moderate complexity  EVALUATION COMPLEXITY: Moderate  PLAN: PT FREQUENCY: 2x/week  PT DURATION: 4 weeks  PLANNED  INTERVENTIONS: Therapeutic exercises, Therapeutic activity, Neuromuscular re-education, Balance training, Gait training, Patient/Family education, Self Care, Orthotic/Fit training, and DME instructions  PLAN FOR NEXT SESSION: Check goals and plan for d/c next visit.     Frazier Butt., PT 03/25/2022, 9:29 AM  [facility] at [facility] [place], [place], [place] [address] Phone # [phone] Fax # [phone]

## 2022-03-25 NOTE — Progress Notes (Signed)
Subjective:   Patient ID: Jim Carson, male   DOB: 71 y.o.   MRN: [id number]   HPI Chief Complaint  Patient presents with   Foot Pain    Patient came in today for right foot ankle, patient rolls the right ankle, which started 5 years ago, Patient denies any pain, TX- brace ( which seems to help, but patient only wears while workout) X-Rays done today    71 year old male presents the office today with above concerns.  He states that he uses a rollator to help.  He presents today with his wife and they are asking any surgical options that may be beneficial to help with his gait.   Review of Systems  All other systems reviewed and are negative.  Past Medical History:  Diagnosis Date   Abnormal gait    due to parkinson's,  uses cane   Allergic rhinitis    Benign essential hypertension    Cancer (HCC)    hx of skin cancer   Chronic constipation    Deviated septum    GERD (gastroesophageal reflux disease)    History of multiple concussions    per pt as teen playing football, no residual   Hypothyroidism    followed by pcp   Left shoulder pain    OA (osteoarthritis)    OSA on CPAP    Parkinson disease [facility])    neurologist-- dr b. Nicki Reaper  '@[facility]'$  Neurology   Pneumonia    Vitamin B12 deficiency    Vitamin D deficiency    Wears glasses     Past Surgical History:  Procedure Laterality Date   APPENDECTOMY  07/1964   LAPAROSCOPIC CHOLECYSTECTOMY  01-21-2017   '@[facility]'$ , [place]   POSTERIOR FUSION CERVICAL SPINE  08-15-2011   '@[facility]'$ , [place]   w/ laminectomy and decompression-- C3 -- T1   POSTERIOR LAMINECTOMY / DECOMPRESSION LUMBAR SPINE  07-30-2013   '@[facility]'$ , [place]   bilateral T11 - 12,  L2 --5   REMOVAL LEFT T1 PARASPINOUS MASS  02-12-2016   '@[facility] ,  [place]   desmoid fibromatosis   SHOULDER ARTHROSCOPY WITH ROTATOR CUFF REPAIR Left 12/15/2018   Procedure: SHOULDER ARTHROSCOPY, biceps tenodesis labored Debridement, submacromial decompression;  Surgeon: Sydnee Cabal, MD;  Location:  [place];  Service: Orthopedics;  Laterality: Left;  interscalene block   TOTAL HIP ARTHROPLASTY Left 09/18/2017   '@[facility]'$ , [place]   TOTAL HIP REVISION Left 09/10/2021   Procedure: Left hip acetabular versus total hip arthroplasty revision, constrained liner;  Surgeon: Gaynelle Arabian, MD;  [facility];  Service: Orthopedics;  Laterality: Left;     Current Outpatient Medications:    carbidopa-levodopa (SINEMET IR) 25-100 MG tablet, Take 3.5 tablets by mouth 3 (three) times daily., Disp: , Rfl:    Carbidopa-Levodopa ER (SINEMET CR) 25-100 MG tablet controlled release, Take 1 tablet by mouth at bedtime. , Disp: , Rfl:    Cholecalciferol (VITAMIN D3) 125 MCG (5000 UT) CAPS, Take 5,000 Units by mouth in the morning., Disp: , Rfl:    desonide (DESOWEN) 0.05 % ointment, Apply 1 application topically daily as needed (irritation)., Disp: , Rfl:    docusate sodium (COLACE) 100 MG capsule, Take 100 mg by mouth daily as needed for moderate constipation., Disp: , Rfl:    hydrochlorothiazide (HYDRODIURIL) 25 MG tablet, Take 25 mg by mouth in the morning., Disp: , Rfl:    ipratropium (ATROVENT) 0.06 % nasal spray, Place 2 sprays into both nostrils 2 (two) times daily as needed for rhinitis.,  Disp: , Rfl:    levothyroxine (SYNTHROID, LEVOTHROID) 75 MCG tablet, Take 75 mcg by mouth daily before breakfast., Disp: , Rfl:    linaclotide (LINZESS) 290 MCG CAPS capsule, Take 290 mcg by mouth daily as needed (constipation)., Disp: , Rfl:    losartan (COZAAR) 100 MG tablet, Take 100 mg by mouth in the morning., Disp: , Rfl:    methocarbamol (ROBAXIN) 500 MG tablet, Take 1 tablet (500 mg total) by mouth every 6 (six) hours as needed for muscle spasms., Disp: 40 tablet, Rfl: 0   pramipexole (MIRAPEX) 0.5 MG tablet, Take 0.5 mg by mouth in the morning and at bedtime., Disp: , Rfl:    rasagiline (AZILECT) 1 MG TABS tablet, Take 1 mg by mouth in the morning., Disp: , Rfl:    vitamin B-12  (CYANOCOBALAMIN) 1000 MCG tablet, Take 1,000 mcg by mouth daily., Disp: , Rfl:   Allergies  Allergen Reactions   Other Itching and Rash    Other reaction(s): Other (See Comments) Dog hair, roaches, dust mites: very slight reaction: causes runny nose Dog hair, roaches, dust mites: very slight reaction: causes runny nose Dog hair, roaches, dust mites: very slight reaction: causes runny nose Dog hair, roaches, dust mites: very slight reaction: causes runny nose           Objective:  Physical Exam  General: AAO x3, NAD  Dermatological: Skin is warm, dry and supple bilateral.  There are no open sores, no preulcerative lesions, no rash or signs of infection present.  Vascular: Dorsalis Pedis artery and Posterior Tibial artery pedal pulses are 2/4 bilateral with immedate capillary fill time. There is no pain with calf compression, swelling, warmth, erythema.   Neruologic: Grossly intact via light touch bilateral.   Musculoskeletal: Dropfoot present on the right side.  Cavus foot type is noted upon weightbearing.  No area pinpoint tenderness.      Assessment:   Dropfoot , cavus     Plan:  -Treatment options discussed including all alternatives, risks, and complications -Etiology of symptoms were discussed -X-rays were obtained and reviewed with the patient.  2 views of the right ankle and 3 views of the right foot were obtained.  Ankle joint space maintained.  Elevated first ray.  Midfoot arthritic changes present. -Discussed with conservative as well as surgical options.  They want to consider possible surgical invention.  May benefit from a ankle fusion, ordered CT scan to further evaluate this. -The only symptoms are due to the Parkinson's but it could be due to other underlying nerve issues as well.  He does follow-up with Dr. Laurance Flatten at the [facility] movement clinic. -Patient symptomatic conservative treatment for several months without significant resolution.    [name]  DPM

## 2022-03-27 ENCOUNTER — Ambulatory Visit: Payer: Medicare Other | Admitting: Physical Therapy

## 2022-03-28 ENCOUNTER — Encounter: Payer: Self-pay | Admitting: Physical Therapy

## 2022-03-28 ENCOUNTER — Ambulatory Visit: Payer: Medicare Other | Admitting: Physical Therapy

## 2022-03-28 DIAGNOSIS — R2689 Other abnormalities of gait and mobility: Secondary | ICD-10-CM

## 2022-03-28 DIAGNOSIS — M6281 Muscle weakness (generalized): Secondary | ICD-10-CM

## 2022-03-28 DIAGNOSIS — R2681 Unsteadiness on feet: Secondary | ICD-10-CM

## 2022-03-28 NOTE — Therapy (Signed)
OUTPATIENT PHYSICAL THERAPY NEURO TREATMENT/DISCHARGE SUMMARY   Patient Name: Jim Carson MRN: [id number] DOB:December 06, 1950, 71 y.o., male Today's Date: 03/28/2022   PCP: Tiana Loft, MD REFERRING PROVIDER: Lavena Bullion, MD  PHYSICAL THERAPY DISCHARGE SUMMARY  Visits from Start of Care: 9  Current functional level related to goals / functional outcomes: Pt has met 2 of 4 LTGs-see below   Remaining deficits: Balance, strength, safety awareness; gait abnormality   Education / Equipment: HEP    Patient agrees to discharge. Patient goals were partially met. Patient is being discharged due to being pleased with the current functional level.    PT End of Session - 03/28/22 0854     Visit Number 9    Number of Visits 9    Date for PT Re-Evaluation 03/29/22    Authorization Type Medicare/AARP    PT Start Time 0852    PT Stop Time 0924    PT Time Calculation (min) 32 min    Equipment Utilized During Treatment Gait belt    Activity Tolerance Patient tolerated treatment well    Behavior During Therapy WFL for tasks assessed/performed                  Past Medical History:  Diagnosis Date   Abnormal gait    due to parkinson's,  uses cane   Allergic rhinitis    Benign essential hypertension    Cancer (HCC)    hx of skin cancer   Chronic constipation    Deviated septum    GERD (gastroesophageal reflux disease)    History of multiple concussions    per pt as teen playing football, no residual   Hypothyroidism    followed by pcp   Left shoulder pain    OA (osteoarthritis)    OSA on CPAP    Parkinson disease [facility])    neurologist-- dr b. Nicki Reaper  _0  Neurology   Pneumonia    Vitamin B12 deficiency    Vitamin D deficiency    Wears glasses    Past Surgical History:  Procedure Laterality Date   APPENDECTOMY  07/1964   LAPAROSCOPIC CHOLECYSTECTOMY  01-21-2017   _1 , [place]   POSTERIOR FUSION CERVICAL SPINE  08-15-2011   _2 , [place]   w/ laminectomy  and decompression-- C3 -- T1   POSTERIOR LAMINECTOMY / DECOMPRESSION LUMBAR SPINE  07-30-2013   _3 , [place]   bilateral T11 - 12,  L2 --5   REMOVAL LEFT T1 PARASPINOUS MASS  02-12-2016   _4 ,  [place]   desmoid fibromatosis   SHOULDER ARTHROSCOPY WITH ROTATOR CUFF REPAIR Left 12/15/2018   Procedure: SHOULDER ARTHROSCOPY, biceps tenodesis labored Debridement, submacromial decompression;  Surgeon: Sydnee Cabal, MD;  Location: [place];  Service: Orthopedics;  Laterality: Left;  interscalene block   TOTAL HIP ARTHROPLASTY Left 09/18/2017   _5 , [place]   TOTAL HIP REVISION Left 09/10/2021   Procedure: Left hip acetabular versus total hip arthroplasty revision, constrained liner;  Surgeon: Gaynelle Arabian, MD;  [facility];  Service: Orthopedics;  Laterality: Left;   Patient Active Problem List   Diagnosis Date Noted   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[place]) 09/10/2021   Recurrent dislocation of left hip 09/10/2021   Primary osteoarthritis of left hip 05/18/2021   PD (Parkinson's disease) ([place]) 05/18/2021   History of hypothyroidism 05/18/2021   History of gastroesophageal reflux (GERD) 05/18/2021   OSA (obstructive sleep apnea) 05/18/2021   Essential hypertension 05/18/2021    ONSET DATE: 02/14/2022 (MD referral)  REFERRING DIAG: Parkinson's disease  THERAPY DIAG:  Other abnormalities of gait and mobility  Unsteadiness on feet  Muscle weakness (generalized)  Rationale for Evaluation and Treatment Rehabilitation  SUBJECTIVE:                                                                                                                                                                                              SUBJECTIVE STATEMENT: Getting ready to go on trip to [place].  Saw orthotist and he gave options about braces for more stability on RLE.  I'm thinking about it. Pt accompanied by: self  PERTINENT HISTORY: multiple hip surgery  revisions, L hip (most recent 08/2021).  Additional PMH see above  PAIN:  No pain.  PRECAUTIONS: Fall and Other: Hx of L hip disclocation x 3 reps with bending/twisting motions  WEIGHT BEARING RESTRICTIONS No  FALLS: Has patient fallen in last 6 months? No, but pt reports near falls.  LIVING ENVIRONMENT: Lives with: lives with their spouse Lives in: House/apartment Stairs: Yes: Internal: 12 steps; on right going up Has following equipment at home: Walker - 4 wheeled and 3-wheeled walker; has R ankle brace  PLOF: Needs assistance with gait  PATIENT GOALS To be better balanced  OBJECTIVE:   TODAY'S TREATMENT: 03/28/2022 Activity Comments  TUG:  15.65 sec with rollator; TUG cognitive:  18.53 sec with rollator Increased time compared to eval; stable throughout and no overt LOB  5x:16.38 sec with significant LOB forward on last rep   Gait velocity:  13.25 sec (2.48 ft/sec) Improved from 2.38 ft/sec  Posterior push and release test:  3 trials:  >4 steps and PT assist to recover   Posterior step and weightshift x 10 reps each leg with BUE support Review of exercise for home  EC on foam:  14.5 sec, 25.9 sec, 30 sec supervision     M-CTSIB  Condition 1: Firm Surface, EO 30 Sec, Mild Sway  Condition 2: Firm Surface, EC 30 Sec, Mild Sway  Condition 3: Foam Surface, EO 30 Sec, Mild Sway  Condition 4: Foam Surface, EC 30 Sec, Moderate Sway   PATIENT EDUCATION: Education details: Progress towards goals, POC; PT recommends pt follow-up again with orthotist for options for brace stability for RLE; recommend screens (PT, OT, speech) in 4-6 months Person educated: Patient Education method: Explanation Education comprehension: verbalized understanding      --------------------------------------------------------------------------------------- Objective measures from eval:   SENSATION: Light touch: impaired to light touch plantar aspect of R foot  COORDINATION: Decreased  coordination RLE-knee hyperextension and weight on lateral aspect of R foot (from previous weakness R ankle due to  back issues)  POSTURE: forward head and flexed trunk   LOWER EXTREMITY MMT:    MMT Right Eval Left Eval  Hip flexion 5 5  Hip extension    Hip abduction    Hip adduction    Hip internal rotation    Hip external rotation    Knee flexion 4+ 4+  Knee extension 5 5  Ankle dorsiflexion 4 5  Ankle plantarflexion    Ankle inversion 4 5  Ankle eversion 3+ 5  (Blank rows = not tested)   TRANSFERS: Assistive device utilized: None  Sit to stand: SBA Stand to sit: SBA Retropulsion with initial attempts at sit<>stand   GAIT: Gait pattern: step through pattern, decreased step length- Right, decreased step length- Left, decreased stance time- Right, and trunk flexed Distance walked: 60 ft x 2 Assistive device utilized: Walker - 4 wheeled Level of assistance: SBA Comments: PT recommends that pt use rollator or 3-wheeled RW (in home) at all times  FUNCTIONAL TESTs:  5 times sit to stand: 13.31 sec Timed up and go (TUG): 14.72 sec TUG cognitive:  16.93 sec  M-CTSIB  Condition 1: Firm Surface, EO 30 Sec, Normal Sway  Condition 2: Firm Surface, EC 30 Sec, Mild Sway  Condition 3: Foam Surface, EO 30 Sec, Mild Sway  Condition 4: Foam Surface, EC 8.03 Sec, Severe Sway   Posterior LOB on heels with condition 4  Posterior push and release:  2-3 steps, needs therapist directly behind to prevent LOB Lateral push and release: > 3 steps to R and to L, therapist assist to recover Four square step test:  13.06 sec, min guard and tendency for posterior lean Gait velocity:  13.81 sec with 4-wheeled RW:  2.38 ft/sec   PATIENT EDUCATION: Education details: PT eval results, POC Person educated: Patient Education method: Explanation Education comprehension: verbalized understanding -------------------------------------------------------- HOME EXERCISE PROGRAM: Access Code:  [email or web address] URL: [email or web address] Date: 03/06/2022 Prepared by: Sherlyn Lees  Exercises - Standing Balance in [place] with Eyes Closed  - 1 x daily - 7 x weekly - 3 reps - 10 sec hold - Corner Balance Feet Apart: Eyes Open With Head Turns  - 1 x daily - 7 x weekly - 3 reps - 10-30 sec hold - Corner Balance Feet Together With Eyes Open  - 1 x daily - 7 x weekly - 3 reps - 10-30 sec hold - Corner Balance Feet Together: Eyes Closed With Head Turns  - 1 x daily - 7 x weekly - 3 reps - 10-30 sec hold Access Code: [email or web address] Exercises - Squat with TRX  - 1 x daily - 7 x weekly - 3 sets - 10 reps - Curtsy Lunge with TRX  - 1 x daily - 7 x weekly - 3 sets - 5 reps - Assisted Lunge with TRX  - 1 x daily - 7 x weekly - 3 sets - 5 reps   GOALS: Goals reviewed with patient? Yes  SHORT TERM GOALS: = LTGs  LONG TERM GOALS: Target date: 03/29/2022  Pt will be independent with HEP for improved balance, strength, gait. Baseline:  Goal status:GOAL MET  2.  Pt will improve TUG/TUG cognitive score to less than or equal to 10% sec for decreased fall risk.  Baseline: 14.72 sec, 16.93 sec Goal status: GOAL NOT MET  3.  Pt will improve performance on Condition 4 on MCTSIB, to at least 20 seconds moderate sway, for improved vestibular system use for balance. Baseline: 8.03 sec posterior LOB; 14.5,  25.9, 30 sec 03/28/2022 Goal status: GOAL MET  4.  Pt will regain balance in posterior and lateral push/release testing in 2 steps or less without therapist assist to regain balance. Baseline: >4 steps and therapist assist Goal status: GOAL NOT MET  ASSESSMENT:  CLINICAL IMPRESSION: Assessed LTGs this visit, with pt meeting 2 of 4 LTGs.  Pt has met LTG 1 for HEP and LTG 3 for improved balance on condition 4 of MCTSIB.  LTG 2 not met for TUG/TUG cognitive scores; however, pt does demonstrate good stability throughout these measures.  LTG 4 not met for posterior push and release test, with pt  needing therapist assist for balance in posterior direction.  PT has educated pt in safety awareness for standing and gait to help prevent falls; also have discussed benefits of additional options for AFO/brace for RLE to improve stability through R knee.  Pt has HEP and plans to continue fitness options upon d/c.  He is appropriate for d/c at this time.  OBJECTIVE IMPAIRMENTS Abnormal gait, decreased balance, decreased mobility, difficulty walking, decreased strength, and postural dysfunction.   ACTIVITY LIMITATIONS transfers and locomotion level  PARTICIPATION LIMITATIONS: community activity  PERSONAL FACTORS Past/current experiences and 3+ comorbidities: see previous PMH-above  are also affecting patient's functional outcome.   REHAB POTENTIAL: Good  CLINICAL DECISION MAKING: Evolving/moderate complexity  EVALUATION COMPLEXITY: Moderate  PLAN: PT FREQUENCY: 2x/week  PT DURATION: 4 weeks  PLANNED INTERVENTIONS: Therapeutic exercises, Therapeutic activity, Neuromuscular re-education, Balance training, Gait training, Patient/Family education, Self Care, Orthotic/Fit training, and DME instructions  PLAN FOR NEXT SESSION: D/C this visit; plan for return screen in 4-6 months due to progressive nature of disease process.     Frazier Butt., PT 03/28/2022, 9:31 AM  [facility] at [facility], [place], [place] [address] Phone # [phone] Fax # [phone]

## 2022-05-01 ENCOUNTER — Ambulatory Visit
Admission: RE | Admit: 2022-05-01 | Discharge: 2022-05-01 | Disposition: A | Discharge status: Home / Self Care | Payer: Medicare Other | Source: Ambulatory Visit | Attending: Podiatry | Admitting: Podiatry

## 2022-05-01 DIAGNOSIS — M19071 Primary osteoarthritis, right ankle and foot: Secondary | ICD-10-CM

## 2022-11-07 ENCOUNTER — Ambulatory Visit: Payer: Medicare Other | Admitting: Occupational Therapy

## 2022-11-07 ENCOUNTER — Ambulatory Visit: Payer: Medicare Other

## 2022-11-07 ENCOUNTER — Ambulatory Visit: Payer: Medicare Other | Attending: Neurology | Admitting: Rehabilitative and Restorative Service Providers"

## 2022-11-07 DIAGNOSIS — R2689 Other abnormalities of gait and mobility: Secondary | ICD-10-CM | POA: Insufficient documentation

## 2022-11-07 DIAGNOSIS — R1312 Dysphagia, oropharyngeal phase: Secondary | ICD-10-CM | POA: Insufficient documentation

## 2022-11-07 DIAGNOSIS — R471 Dysarthria and anarthria: Secondary | ICD-10-CM | POA: Insufficient documentation

## 2022-11-07 DIAGNOSIS — R29818 Other symptoms and signs involving the nervous system: Secondary | ICD-10-CM | POA: Insufficient documentation

## 2022-11-07 NOTE — Therapy (Signed)
[facility] at [facility] [address] [place], [address] [place], [place] [address] Phone # [phone] Fax # [phone]   Patient Details  Name: Jim Carson MRN: [id number] Date of Birth: Nov 27, 1950 Referring Provider:  Carrie Mew, MD   Encounter Date: 11/07/22   Physical Therapy Parkinson's Disease Screen   Subjective: No falls recently, but near falls. "I feel like I'm getting weaker."  The last fall he had was with a 3 wheeled walker and it was in January. This is the only fall in the past 6 months.  He has R foot drag and he wears an AFO for support noting the foot rolls.He only wears the AFO when coming to therapy or working out. He reports the foot rolls at other times.  Objective: Timed Up and Go test:15.94 seconds  10 meter walk test: 32.8/12.81 seconds=2.56 ft/sec  5 time sit to stand test:18.60 seconds  Other: Note bilat trendelenberg gait, R foot slap (even with AFO donned), forward leaning trunk posture, tandem gait due to hip weakness.  Recommendations: Patient would benefit from Physical Therapy evaluation due to subjective reports of weakness, worsening balance.  Miqueas Whilden, PT 11/07/22

## 2022-11-07 NOTE — Therapy (Signed)
[place] [facility] [address], [address] [place], [place], [address] Phone: [phone]   Fax:  [phone]  Patient Details  Name: Jim Carson MRN: [id number] Date of Birth: September 24, 1950 Referring Provider:  Malka So., MD  Encounter Date: 11/07/2022  Occupational Therapy Parkinson's Disease Screen  Hand dominance:  Left   Physical Performance Test item #2 (simulated eating):  12.44 sec  Fastening/unfastening 3 buttons in:  33.18 sec  9-hole peg test:    RUE 71.88 sec        LUE  40.20 sec  Other Comments:  Pt reports that his balance is not what he wants it to be.  Reports handwriting has always been small.  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    Pt does not require occupational therapy services at this time.  Recommended occupational therapy screen in   6-8 months.    Rosalio Loud, OT 11/07/2022, 11:50 AM  [facility] [address], [address] [place], [place], [address] Phone: [phone]   Fax:  [phone]

## 2022-11-07 NOTE — Therapy (Addendum)
[place] [facility] [address] [place], [place], [address] Phone: [phone]   Fax:  [phone]  Patient Details  Name: Jim Carson MRN: [id number] Date of Birth: 1950/09/10 Referring Provider:  Carrie Mew, MD  Encounter Date: 11/07/2022  Speech Therapy Parkinson's Disease Screen   Decibel Level today: low-mid 60sdB  (WNL=70-72 dB) with sound level meter 30cm away from pt's mouth. Pt's conversational volume has decreased since last treatment course in 2021 and since last PD screen in June 2022 at which time averaged in upper 60s dB.  Pt does report difficulty with swallowing, which does warrant further evaluation. He endorses frequent coughing with meals.   RECOMMENDATIONS: 1) Pt would benefit from speech-language eval for dysarthria. Please send referral via Epic. 2) Pt would also benefit from a modified barium swallow eval - please fax referral to [phone], or call [phone] for information how to correctly route MBS referral to ensure pt gets scheduled for this evaluation in a timely manner.  Lorely Bubb, CCC-SLP 11/07/2022, 11:22 PM  [place] [facility] [address], [address] [place], [place], [address] Phone: [phone]   Fax:  [phone]

## 2022-11-08 ENCOUNTER — Telehealth: Payer: Self-pay | Admitting: Rehabilitative and Restorative Service Providers"

## 2022-11-08 NOTE — Telephone Encounter (Signed)
Jim Carson was seen for PT, OT, speech therapy screens in our multi-disciplinary clinic.   RECOMMENDATIONS-- please submit orders if you agree. PT: Patient would benefit from Physical Therapy evaluation due to subjective reports of weakness, worsening balance. Please send referral via EPIC or fax to [facility] at [phone]  Speech therapy: 1) Pt would benefit from speech-language eval for dysarthria. Please send referral via Epic or submit via fax to [facility] at [phone].  2) Pt would also benefit from a modified barium swallow eval - please fax referral to [phone], or call [phone] for information how to correctly route MBS referral to ensure pt gets scheduled for this evaluation in a timely manner.    Thank you, Margretta Ditty, PT [facility] at [facility] [address] [place], [place] [address] Phone # [phone] Fax # [phone]

## 2022-11-22 ENCOUNTER — Other Ambulatory Visit (HOSPITAL_COMMUNITY): Payer: Self-pay | Admitting: *Deleted

## 2022-11-22 DIAGNOSIS — R1310 Dysphagia, unspecified: Secondary | ICD-10-CM

## 2022-11-22 DIAGNOSIS — R059 Cough, unspecified: Secondary | ICD-10-CM

## 2022-12-10 ENCOUNTER — Ambulatory Visit: Payer: Medicare Other | Attending: Neurology

## 2022-12-10 ENCOUNTER — Other Ambulatory Visit: Payer: Self-pay

## 2022-12-10 DIAGNOSIS — R2689 Other abnormalities of gait and mobility: Secondary | ICD-10-CM | POA: Insufficient documentation

## 2022-12-10 DIAGNOSIS — R262 Difficulty in walking, not elsewhere classified: Secondary | ICD-10-CM | POA: Diagnosis present

## 2022-12-10 DIAGNOSIS — R1312 Dysphagia, oropharyngeal phase: Secondary | ICD-10-CM | POA: Diagnosis present

## 2022-12-10 DIAGNOSIS — R2681 Unsteadiness on feet: Secondary | ICD-10-CM | POA: Diagnosis present

## 2022-12-10 DIAGNOSIS — R471 Dysarthria and anarthria: Secondary | ICD-10-CM | POA: Insufficient documentation

## 2022-12-10 DIAGNOSIS — M6281 Muscle weakness (generalized): Secondary | ICD-10-CM | POA: Diagnosis present

## 2022-12-10 NOTE — Therapy (Signed)
OUTPATIENT PHYSICAL THERAPY NEURO EVALUATION   Patient Name: Jim Carson MRN: [id number] DOB:[birth year], 72 y.o., male Today's Date: 12/10/2022   PCP: Malka So, MD REFERRING PROVIDER:Moore, Samara Deist, MD  END OF SESSION:  PT End of Session - 12/10/22 1021     Visit Number 1    Number of Visits 12    Date for PT Re-Evaluation 02/04/23    Authorization Type Medicare/AARP    PT Start Time 1015    PT Stop Time 1100    PT Time Calculation (min) 45 min             Past Medical History:  Diagnosis Date   Abnormal gait    due to parkinson's,  uses cane   Allergic rhinitis    Benign essential hypertension    Cancer (HCC)    hx of skin cancer   Chronic constipation    Deviated septum    GERD (gastroesophageal reflux disease)    History of multiple concussions    per pt as teen playing football, no residual   Hypothyroidism    followed by pcp   Left shoulder pain    OA (osteoarthritis)    OSA on CPAP    Parkinson disease    neurologist-- dr b. Lorin Picket  @[facility]   Pneumonia    Vitamin B12 deficiency    Vitamin D deficiency    Wears glasses    Past Surgical History:  Procedure Laterality Date   APPENDECTOMY  07/1964   LAPAROSCOPIC CHOLECYSTECTOMY  01-21-2017   @[facility] , [place]   POSTERIOR FUSION CERVICAL SPINE  08-15-2011   @[facility] , [place]   w/ laminectomy and decompression-- C3 -- T1   POSTERIOR LAMINECTOMY / DECOMPRESSION LUMBAR SPINE  07-30-2013   @[facility] , [place]   bilateral T11 - 12,  L2 --5   REMOVAL LEFT T1 PARASPINOUS MASS  02-12-2016   @[facility] ,  [place]   desmoid fibromatosis   SHOULDER ARTHROSCOPY WITH ROTATOR CUFF REPAIR Left 12/15/2018   Procedure: SHOULDER ARTHROSCOPY, biceps tenodesis labored Debridement, submacromial decompression;  Surgeon: Eugenia Mcalpine, MD;  Location: [facility];  Service: Orthopedics;  Laterality: Left;  interscalene block   TOTAL HIP ARTHROPLASTY Left 09/18/2017   @[facility] , [place]   TOTAL HIP REVISION Left  09/10/2021   Procedure: Left hip acetabular versus total hip arthroplasty revision, constrained liner;  Surgeon: Ollen Gross, MD;  Location: [facility];  Service: Orthopedics;  Laterality: Left;   Patient Active Problem List   Diagnosis Date Noted   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HCC) 09/10/2021   Recurrent dislocation of left hip 09/10/2021   Primary osteoarthritis of left hip 05/18/2021   PD (Parkinson's disease) 05/18/2021   History of hypothyroidism 05/18/2021   History of gastroesophageal reflux (GERD) 05/18/2021   OSA (obstructive sleep apnea) 05/18/2021   Essential hypertension 05/18/2021    ONSET DATE: 2022  REFERRING DIAG: G20.A1 (ICD-10-CM) - Parkinson's disease   THERAPY DIAG:  Difficulty in walking, not elsewhere classified  Other abnormalities of gait and mobility  Unsteadiness on feet  Muscle weakness (generalized)  Rationale for Evaluation and Treatment: Rehabilitation  SUBJECTIVE:  SUBJECTIVE STATEMENT: Noting that balance has been going downhill lately.  Reports he will be working with Systems analyst (ACT).  Pt has hx of left hip dislocation and s/p revision surgery in March 2023 and has noted lingering leg weakness. Right shoulder has been sore, hx of left shoulder surgery. Pt accompanied by: self  PERTINENT HISTORY:   The last fall he had was with a 3 wheeled walker and it was in January. This is the only fall in the past 6 months.   He has R foot drag and he wears an AFO for support noting the foot rolls.He only wears the AFO when coming to therapy or working out. He reports the foot rolls at other times.   Objective: Timed Up and Go test:15.94 seconds   10 meter walk test: 32.8/12.81 seconds=2.56 ft/sec   5 time sit to stand test:18.60 seconds    Other: Note bilat trendelenberg gait, R foot slap (even with AFO donned), forward leaning trunk posture, tandem gait due to hip weakness.  PAIN:  Are you having pain? No  PRECAUTIONS: Fall  WEIGHT BEARING RESTRICTIONS: No  FALLS: Has patient fallen in last 6 months? Yes. Number of falls 2  LIVING ENVIRONMENT: Lives with: lives with their family Lives in: House/apartment Stairs: Yes: Internal: 14 stairs to man cave steps; can reach both Has following equipment at home: Dan Humphreys - 4 wheeled  PLOF: Independent with household mobility with device and Independent with community mobility with device  PATIENT GOALS: improve balance  OBJECTIVE:   DIAGNOSTIC FINDINGS: n/a for this episode  COGNITION: Overall cognitive status: Within functional limits for tasks assessed   SENSATION: WFL, right plantar surface numb  COORDINATION: Able to perform rapid alternating movements  EDEMA:  none  MUSCLE TONE: normal, no spasticity or cogwheeling noted in LLE  MUSCLE LENGTH: Full knee extension in sitting    POSTURE: forward head  LOWER EXTREMITY ROM:     Active  Right Eval Left Eval  Hip flexion    Hip extension    Hip abduction    Hip adduction    Hip internal rotation    Hip external rotation    Knee flexion 110 110  Knee extension 0 0  Ankle dorsiflexion 10 10  Ankle plantarflexion    Ankle inversion    Ankle eversion     (Blank rows = not tested)  LOWER EXTREMITY MMT:    Foot drop/drag on Right ankle  BED MOBILITY:  independent  TRANSFERS: Assistive device utilized: Environmental consultant - 4 wheeled  Sit to stand: Complete Independence Stand to sit: Complete Independence Chair to chair: Complete Independence and Modified independence Floor:  NT   CURB:  Level of Assistance: Modified independence Assistive device utilized: Environmental consultant - 4 wheeled Curb Comments:   STAIRS: Level of Assistance: Modified independence Stair Negotiation Technique: Alternating Pattern  with  Bilateral Rails Number of Stairs: 12  Height of Stairs: 4-6"  Comments:   GAIT: Gait pattern: trendelenburg, trunk flexed, and wide BOS Distance walked:  Assistive device utilized: Environmental consultant - 4 wheeled Level of assistance: Modified independence and SBA Comments:   FUNCTIONAL TESTS:  5 times sit to stand: 14 sec Mini-BESTest : 16/28 Berg Balance Test: TBD      PATIENT EDUCATION: Education details: assessment details Person educated: Patient Education method: Explanation Education comprehension: verbalized understanding  HOME EXERCISE PROGRAM: TBD  GOALS: Goals reviewed with patient? Yes  SHORT TERM GOALS: Target date: 01/07/2023    Patient will be independent in HEP to  improve functional outcomes Baseline: Goal status: INITIAL  2.  Demo improved balance and reduced risk for falls per score 45/56 Berg Balance Test Baseline: TBD Goal status: INITIAL    LONG TERM GOALS: Target date: 02/04/2023    Demo improved balance, postural control, and reduced risk for falls per score 22/28 Mini-BESTest Baseline: 16/28 Goal status: INITIAL  2.  Demo independence in complex HEP including activities for balance, strength, coordination, and ambulation Baseline:  Goal status: INITIAL  3.  Teach-back appropriate community events/classes for those with PD Baseline:  Goal status: INITIAL    ASSESSMENT:  CLINICAL IMPRESSION: Patient is a 72 y.o. man who was seen today for physical therapy evaluation and treatment for PD.  Patient experiencing recent history of falling and notes overall decline in functional mobility and difficulty with postural control and coordination leading to increased difficulty in walking, increased LOB, and increased level of assistance from caregivers.  Patient would benefit from PT Services to intervene and reduce risk for falls and provide relevant strategies for long term management   OBJECTIVE IMPAIRMENTS: Abnormal gait, decreased activity tolerance,  decreased balance, decreased coordination, difficulty walking, and decreased strength.   ACTIVITY LIMITATIONS: carrying, lifting, bending, transfers, and locomotion level  PARTICIPATION LIMITATIONS: cleaning, laundry, shopping, community activity, and yard work  PERSONAL FACTORS: Age, Time since onset of injury/illness/exacerbation, and 1-2 comorbidities: PD, lumbar sx, hip THR and revision  are also affecting patient's functional outcome.   REHAB POTENTIAL: Good  CLINICAL DECISION MAKING: Evolving/moderate complexity  EVALUATION COMPLEXITY: Moderate  PLAN:  PT FREQUENCY: 1-2x/week, 2x/wk x 4 wks then 1xwk/ x 4 wks  PT DURATION: 8 weeks  PLANNED INTERVENTIONS: Therapeutic exercises, Therapeutic activity, Neuromuscular re-education, Balance training, Gait training, Patient/Family education, Self Care, Joint mobilization, Stair training, Vestibular training, Canalith repositioning, DME instructions, Aquatic Therapy, Dry Needling, Electrical stimulation, Spinal mobilization, and Manual therapy  PLAN FOR NEXT SESSION: Berg Balance Test, HEP development   12:57 PM, 12/10/22 M. Shary Decamp, PT, DPT Physical Therapist- [place] Office Number: [phone]

## 2022-12-11 ENCOUNTER — Ambulatory Visit (HOSPITAL_COMMUNITY)
Admission: RE | Admit: 2022-12-11 | Discharge: 2022-12-11 | Disposition: A | Discharge status: Home / Self Care | Payer: Medicare Other | Source: Ambulatory Visit | Attending: Internal Medicine | Admitting: Internal Medicine

## 2022-12-11 DIAGNOSIS — R059 Cough, unspecified: Secondary | ICD-10-CM

## 2022-12-11 DIAGNOSIS — K219 Gastro-esophageal reflux disease without esophagitis: Secondary | ICD-10-CM | POA: Diagnosis not present

## 2022-12-11 DIAGNOSIS — G20A1 Parkinson's disease without dyskinesia, without mention of fluctuations: Secondary | ICD-10-CM | POA: Insufficient documentation

## 2022-12-11 DIAGNOSIS — G4733 Obstructive sleep apnea (adult) (pediatric): Secondary | ICD-10-CM | POA: Insufficient documentation

## 2022-12-11 DIAGNOSIS — R1312 Dysphagia, oropharyngeal phase: Secondary | ICD-10-CM | POA: Diagnosis present

## 2022-12-11 DIAGNOSIS — R1310 Dysphagia, unspecified: Secondary | ICD-10-CM

## 2022-12-16 ENCOUNTER — Ambulatory Visit: Payer: Medicare Other | Admitting: Physical Therapy

## 2022-12-16 ENCOUNTER — Encounter: Payer: Self-pay | Admitting: Physical Therapy

## 2022-12-16 ENCOUNTER — Ambulatory Visit: Payer: Medicare Other

## 2022-12-16 DIAGNOSIS — R1312 Dysphagia, oropharyngeal phase: Secondary | ICD-10-CM

## 2022-12-16 DIAGNOSIS — R471 Dysarthria and anarthria: Secondary | ICD-10-CM

## 2022-12-16 DIAGNOSIS — R262 Difficulty in walking, not elsewhere classified: Secondary | ICD-10-CM | POA: Diagnosis not present

## 2022-12-16 DIAGNOSIS — M6281 Muscle weakness (generalized): Secondary | ICD-10-CM

## 2022-12-16 DIAGNOSIS — R2689 Other abnormalities of gait and mobility: Secondary | ICD-10-CM

## 2022-12-16 DIAGNOSIS — R2681 Unsteadiness on feet: Secondary | ICD-10-CM

## 2022-12-16 NOTE — Therapy (Signed)
OUTPATIENT SPEECH LANGUAGE PATHOLOGY PARKINSON'S EVALUATION   Patient Name: Jim Carson MRN: [id number] DOB:1950-07-07, 72 y.o., male Today's Date: 12/16/2022  PCP: Malka So., MD REFERRING PROVIDER: Carrie Mew, MD  END OF SESSION:  End of Session - 12/16/22 1354     Visit Number 1    Number of Visits 17    Date for SLP Re-Evaluation 02/14/23    SLP Start Time 1020    SLP Stop Time  1100    SLP Time Calculation (min) 40 min    Activity Tolerance Patient tolerated treatment well             Past Medical History:  Diagnosis Date   Abnormal gait    due to parkinson's,  uses cane   Allergic rhinitis    Benign essential hypertension    Cancer (HCC)    hx of skin cancer   Chronic constipation    Deviated septum    GERD (gastroesophageal reflux disease)    History of multiple concussions    per pt as teen playing football, no residual   Hypothyroidism    followed by pcp   Left shoulder pain    OA (osteoarthritis)    OSA on CPAP    Parkinson disease    neurologist-- dr b. Lorin Picket  @[facility]   Pneumonia    Vitamin B12 deficiency    Vitamin D deficiency    Wears glasses    Past Surgical History:  Procedure Laterality Date   APPENDECTOMY  07/1964   LAPAROSCOPIC CHOLECYSTECTOMY  01-21-2017   @[facility] , [place]   POSTERIOR FUSION CERVICAL SPINE  08-15-2011   @[facility] , [place]   w/ laminectomy and decompression-- C3 -- T1   POSTERIOR LAMINECTOMY / DECOMPRESSION LUMBAR SPINE  07-30-2013   @[facility] , [place]   bilateral T11 - 12,  L2 --5   REMOVAL LEFT T1 PARASPINOUS MASS  02-12-2016   @[facility] ,  [place]   desmoid fibromatosis   SHOULDER ARTHROSCOPY WITH ROTATOR CUFF REPAIR Left 12/15/2018   Procedure: SHOULDER ARTHROSCOPY, biceps tenodesis labored Debridement, submacromial decompression;  Surgeon: Eugenia Mcalpine, MD;  Location: [facility];  Service: Orthopedics;  Laterality: Left;  interscalene block   TOTAL HIP ARTHROPLASTY Left 09/18/2017    @[facility] , [place]   TOTAL HIP REVISION Left 09/10/2021   Procedure: Left hip acetabular versus total hip arthroplasty revision, constrained liner;  Surgeon: Ollen Gross, MD;  Location: [facility];  Service: Orthopedics;  Laterality: Left;   Patient Active Problem List   Diagnosis Date Noted   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HCC) 09/10/2021   Recurrent dislocation of left hip 09/10/2021   Primary osteoarthritis of left hip 05/18/2021   PD (Parkinson's disease) 05/18/2021   History of hypothyroidism 05/18/2021   History of gastroesophageal reflux (GERD) 05/18/2021   OSA (obstructive sleep apnea) 05/18/2021   Essential hypertension 05/18/2021    ONSET DATE: 2013 (script dated 11/22/22)  REFERRING DIAG:  G20.A1 (ICD-10-CM) - Parkinson's disease    THERAPY DIAG:  Dysphagia, oropharyngeal phase  Dysarthria and anarthria  Rationale for Evaluation and Treatment: Rehabilitation  SUBJECTIVE:   SUBJECTIVE STATEMENT:  Pt had ST screen in May and volume was in low-mid 60s dB. Had MBS on 12/11/22. Pt accompanied by: self  PERTINENT HISTORY: Patient has past medical history significant for Parkinson's disease diagnosed in 2013 symptomatic starting 2012, gait disturbance, dysphagia, allergic rhinitis, GERD, OSA on CPAP. Prior surgical history includes cervical spine fusion posterior 2013 and patient denies dysphagia following, left hip revision  2020, posterior laminectomy/decompression of lumbar spine, removal left T1 paraspinous mass. Pt had ST course in 2021 which was successful in improving speech loudness, and participation in swallowing exercises.  PAIN:  Are you having pain? Yes: NPRS scale: 3/10 Pain location: rt shoulder Pain description: sore Aggravating factors: use Relieving factors: Alleve  FALLS: Has patient fallen in last 6 months?  See PT evaluation for details  LIVING ENVIRONMENT: Lives with: lives with their spouse Lives in: House/apartment  PLOF:  Level  of assistance: Independent with ADLs, Independent with IADLs Employment: Retired  PATIENT GOALS: Improve loudness and swallowing  OBJECTIVE:   DIAGNOSTIC FINDINGS:  FINDINGS FROM MBS 12/11/22: (MODIFIED WITH BOLD PRINT) Clinical Impression: Patient swallow function is consistent with prior modified barium swallow study.  Mild oropharyngeal phase dysphagia continues due to patient's Parkinson's.  Minimal mastication of solids noted despite saying he felt like he chewed well.  Pharyngeal timing of swallow actually appeared improved with swallowing trigger at tongue base, vallecula with pures and piriform sinus with liquids.  Retention mostly vallecular and on the superior surface of epiglottis present due to diminished tongue base retraction and wall motility.  Patient senses retention at the piriform sinus when it is indeed located at the vallecular space. Liquid swallows are most effective to diminish amount of pudding and cracker bolus retained.  Various postures including chin, head turn right, head turn left did not appear to significantly improved pharyngeal clearance.  If any posture was helpful perhaps head turn left may have been slightly helpful.  Cueing patient to expectorate (cough) was noted to clear vallecular retention that he did want clear with dry swallows.     Motility adequacy of musculature varied during the study with epiglottic deflection better with liquids than with solids.  Patient was able to easily swallow barium tablet with liquid without deficits, with no aspiration or penetration..  Of note he did not cough or clear his throat during entire study while swallowing barium however prior to barium administration patient did clear his throat immediately post and water swallow.     Highly recommend patient follow-up with speech pathologist to establish exercise regimen to improve pharyngeal motility.  Recommend he masticates well, follow solids with liquids, maintain strength of  cough, voice, and expectoration for airway protection.  Also provided him information regarding a vacuum device for clearance of aspiration for emergent use.  Thank you for this consult.  COGNITION: Overall cognitive status: Within functional limits for tasks assessed  MOTOR SPEECH: Overall motor speech: impaired Level of impairment: Phrase Respiration: thoracic breathing and diaphragmatic/abdominal breathing Phonation: low vocal intensity  Resonance: WFL Articulation: Appears intact during evaluation today Intelligibility: Intelligible Effective technique: increased vocal intensity  ORAL MOTOR EXAMINATION: Overall status: Impaired: Labial: Bilateral (ROM and Coordination) Lingual: Bilateral (ROM, Strength, and Coordination) Comments: rt labial, lingual strength slightly better than lt labial, lingual strength   OBJECTIVE VOICE ASSESSMENT: Oral reading (passage) loudness average: upper 60s dB Oral reading loudness range: 60-68 dB Conversational loudness average: 64 dB Conversational loudness range: 60-67 dB Voice quality: breathy and low vocal intensity Stimulability trials: Given SLP modeling and rare min cues, loudness average increased to 70dB (range of 66 to 74) at unstructured question and answer level.  Comments: Pt extremely stimulable for incr'd intention level/effortful voice.  Completed audio recording of patients baseline voice without cueing from SLP: No  PATIENT REPORTED OUTCOME MEASURES (PROM): EAT-10:   and Communication Effectiveness Survey: to be provided next session  TODAY'S TREATMENT:  DATE:   12/16/22 (eval): SLP reviewed results from MBS and provided precautions and HEP.   PATIENT EDUCATION: Education details: See "pt instructions" and "today's treatment". Person educated: Patient Education method: Explanation,  Demonstration, Verbal cues, and Handouts Education comprehension: verbalized understanding, returned demonstration, verbal cues required, and needs further education  HOME EXERCISE PROGRAM: See Pt Instructions   GOALS: Goals reviewed with patient? Yes  SHORT TERM GOALS: Target date: 01/17/23  Pt will complete dysphagia HEP with modified independence x2 sessions Baseline: Goal status: INITIAL  2.  Pt will perform thorough mastication and alternate bite/sip precautions with POs during session with modified independence in 2 sessions Baseline:  Goal status: INITIAL  3.  Pt will complete vocal warm up tasks (ah, glides, counting, etc) with rare min A in 2 sessions Baseline:  Goal status: INITIAL  4.  Reita Cliche will improve speech volume in 5 minutes simple conversation to low 70s dB in 3 sessions Baseline:  Goal status: INITIAL  5.  Pt will demonstrate completion of home exercises as prescribed 90% compliance in first 2 weeks Baseline:  Goal status: INITIAL   LONG TERM GOALS: Target date: 02/14/23  Pt will complete dysphagia HEP with independence x3 sessions Baseline:  Goal status: INITIAL  2.  Pt will perform thorough mastication and alternate bite/sip precautions with POs during session with independence in 3 sessions Baseline:  Goal status: INITIAL  3.  Pt will complete vocal warm up tasks (ah, glides, counting, etc) with modified independence in 3 sessions Baseline:  Goal status: INITIAL  4.  Reita Cliche will improve speech volume in 10 minutes simple-mod complex conversation to low 70s dB in 3 sessions Baseline:  Goal status: INITIAL  5.  In last 1-2 sessions, both of Bobby's PROM scores will improve compared to initial administration Baseline:  Goal status: INITIAL  6.  Reita Cliche will tell SLP what HEP to perform in order to maintain WNL volume speech over time Baseline:  Goal status: INITIAL  ASSESSMENT:  CLINICAL IMPRESSION: Patient is a 72 y.o. M who was seen today  for assessment of voice (reduced voice volume), and review MBS study and initiate HEP for dysphagia. When he heard a recording of his own voice in unstructured question and answer using intent and effort, "Wow I never thought it could sound like that, I need to make that a habit." Pt completed dysphagia HEP with occasional min A faded to independent. He told SLP his list of precautions at two different times in the session today, and will be provided with this list next session.   OBJECTIVE IMPAIRMENTS: Objective impairments include dysarthria and dysphagia. These impairments are limiting patient from household responsibilities, ADLs/IADLs, effectively communicating at home and in community, and safety when swallowing.Factors affecting potential to achieve goals and functional outcome are  none noted .Marland Kitchen Patient will benefit from skilled SLP services to address above impairments and improve overall function.  REHAB POTENTIAL: Good  PLAN:  SLP FREQUENCY: 2x/week  SLP DURATION: 8 weeks  PLANNED INTERVENTIONS: Aspiration precaution training, Pharyngeal strengthening exercises, Diet toleration management , Environmental controls, Internal/external aids, Oral motor exercises, Functional tasks, SLP instruction and feedback, Compensatory strategies, and Patient/family education    [facility], CCC-SLP 12/16/2022, 1:55 PM

## 2022-12-16 NOTE — Patient Instructions (Signed)
   SWALLOWING EXERCISES Do these 6 days a week for 8 weeks, then 2-3 times per week afterwards  Effortful Swallows - Press your tongue against the roof of your mouth for 3 seconds, then swallow hard on your saliva or a sip of water - Repeat 30 times each day, don't do less than 5 at once  Masako Swallow - swallow with your tongue sticking out - Stick tongue out past your lips and gently hold tongue with your teeth - Swallow, while holding your tongue with your teeth - Repeat 30 times each day, don't do less than 5 at once *use a wet spoon if your mouth gets dry*  Mendelsohn Maneuver - "half swallow" exercise - Start to swallow, and hold the squeeze as hard as you can with the throat muscles for 5-7 seconds - Repeat 30 times each day, don't do less than 5 at once *use a wet spoon if your mouth gets dry*  Chin pushback - Open your mouth  - Place your fist UNDER your chin near your neck - Tuck your chin and push back with your fist for 5 seconds - Repeat 30 times each day, don't do less than 5 at once       5. "Super Swallow"  - Take a breath and hold it  (take a few drops of water if needed)   - Swallow, then IMMEDIATELY cough  -  Repeat 30 times each day, don't do less than 5 at once

## 2022-12-16 NOTE — Therapy (Signed)
OUTPATIENT PHYSICAL THERAPY NEURO TREATMENT NOTE   Patient Name: Jim Carson MRN: [id number] DOB:[birth year], 72 y.o., male Today's Date: 12/16/2022   PCP: Malka So, MD REFERRING PROVIDER:Moore, Samara Deist, MD  END OF SESSION:  PT End of Session - 12/16/22 0933     Visit Number 2    Number of Visits 12    Date for PT Re-Evaluation 02/04/23    Authorization Type Medicare/AARP    PT Start Time 0934    PT Stop Time 1014    PT Time Calculation (min) 40 min    Equipment Utilized During Treatment Gait belt    Behavior During Therapy WFL for tasks assessed/performed             Past Medical History:  Diagnosis Date   Abnormal gait    due to parkinson's,  uses cane   Allergic rhinitis    Benign essential hypertension    Cancer (HCC)    hx of skin cancer   Chronic constipation    Deviated septum    GERD (gastroesophageal reflux disease)    History of multiple concussions    per pt as teen playing football, no residual   Hypothyroidism    followed by pcp   Left shoulder pain    OA (osteoarthritis)    OSA on CPAP    Parkinson disease    neurologist-- dr b. 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   @[facility] ,  [place]   desmoid fibromatosis   SHOULDER ARTHROSCOPY WITH ROTATOR CUFF REPAIR Left 12/15/2018   Procedure: SHOULDER ARTHROSCOPY, biceps tenodesis labored Debridement, submacromial decompression;  Surgeon: Eugenia Mcalpine, MD;  Location: [facility] [place];  Service: Orthopedics;  Laterality:  Left;  interscalene block   TOTAL HIP ARTHROPLASTY Left 09/18/2017   @[facility] , [place]   TOTAL HIP REVISION Left 09/10/2021   Procedure: Left hip acetabular versus total hip arthroplasty revision, constrained liner;  Surgeon: Ollen Gross, MD;  Location: [facility];  Service: Orthopedics;  Laterality: Left;   Patient Active Problem List   Diagnosis Date Noted   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HCC) 09/10/2021   Recurrent dislocation of left hip 09/10/2021   Primary osteoarthritis of left hip 05/18/2021   PD (Parkinson's disease) 05/18/2021   History of hypothyroidism 05/18/2021   History of gastroesophageal reflux (GERD) 05/18/2021   OSA (obstructive sleep apnea) 05/18/2021   Essential hypertension 05/18/2021    ONSET DATE: 2022  REFERRING DIAG: G20.A1 (ICD-10-CM) - Parkinson's disease   THERAPY DIAG:  Other abnormalities of gait and mobility  Unsteadiness on feet  Muscle weakness (generalized)  Rationale for Evaluation and Treatment: Rehabilitation  SUBJECTIVE:  SUBJECTIVE STATEMENT: Don't think my balance is as good.  The last fall was in late May.  Stiffness in back *Daily exercise:  sit to stand, stretches for low back*  Pt accompanied by: self  PERTINENT HISTORY:   The last fall he had was with a 3 wheeled walker and it was in January. This is the only fall in the past 6 months.   He has R foot drag and he wears an AFO for support noting the foot rolls.He only wears the AFO when coming to therapy or working out. He reports the foot rolls at other times.   Objective: Timed Up and Go test:15.94 seconds   10 meter walk test: 32.8/12.81 seconds=2.56 ft/sec   5 time sit to stand test:18.60 seconds   Other: Note bilat trendelenberg gait, R foot slap (even with AFO donned),  forward leaning trunk posture, tandem gait due to hip weakness.  PAIN:  Are you having pain? No  PRECAUTIONS: Fall  WEIGHT BEARING RESTRICTIONS: No  FALLS: Has patient fallen in last 6 months? Yes. Number of falls 2  LIVING ENVIRONMENT: Lives with: lives with their family Lives in: House/apartment Stairs: Yes: Internal: 14 stairs to man cave steps; can reach both Has following equipment at home: Dan Humphreys - 4 wheeled  PLOF: Independent with household mobility with device and Independent with community mobility with device  PATIENT GOALS: improve balance  OBJECTIVE:    TODAY'S TREATMENT: 12/16/2022 Activity Comments  Berg Balance:  34/56 Scores <45/56 indicate increased fall risk  BLE strengthening: 3# Seated march, 3 x 10 reps Seated    Seated PWR! Up 2 x 10, upright posture hold 3 sec For postural strength  Standing PWR! Up x 10 reps   Standing hip abduction 10 reps       Strength:     R   L Hip flexion   4/5   4+/5 Hamstrings   4/5   4+/5   Quads    4+/5   4+/5 Hip Abduction  4/5   4/5   [facility] PT Assessment - 12/16/22 0001       Standardized Balance Assessment   Standardized Balance Assessment Berg Balance Test      Berg Balance Test   Sit to Stand Able to stand without using hands and stabilize independently    Standing Unsupported Able to stand safely 2 minutes    Sitting with Back Unsupported but Feet Supported on Floor or Stool Able to sit safely and securely 2 minutes    Stand to Sit Sits safely with minimal use of hands    Transfers Able to transfer safely, definite need of hands    Standing Unsupported with Eyes Closed Able to stand 10 seconds with supervision    Standing Unsupported with Feet Together Needs help to attain position but able to stand for 30 seconds with feet together    From Standing, Reach Forward with Outstretched Arm Can reach forward >12 cm safely (5")    From Standing Position, Pick up Object from Floor Able to pick up shoe, needs  supervision    From Standing Position, Turn to Look Behind Over each Shoulder Turn sideways only but maintains balance    Turn 360 Degrees Needs assistance while turning    Standing Unsupported, Alternately Place Feet on Step/Stool Needs assistance to keep from falling or unable to try    Standing Unsupported, One Foot in Front Able to take small step independently and hold 30 seconds    Standing  on One Leg Tries to lift leg/unable to hold 3 seconds but remains standing independently    Total Score 34    Berg comment: Scores <45/56 indicate incresed fall risk            PATIENT EDUCATION: Education details: HEP initiated Person educated: Patient Education method: Explanation, Verbal cues, and Handouts Education comprehension: verbalized understanding, returned demonstration, and needs further education  Access Code: [email or web address] URL: [email or web address] Date: 12/16/2022 Prepared by: [facility] - Outpatient  Rehab - [facility]  Program Notes Seated and standing PWR! Up, 2 x 10 repsSeated PWR! Step-step out and in, 3# weight, 3 x 10  Exercises - Seated March  - 1 x daily - 5 x weekly - 3 sets - 10 reps ------------------------------------------------- Objective measures below taken at initial evaluation:  DIAGNOSTIC FINDINGS: n/a for this episode  COGNITION: Overall cognitive status: Within functional limits for tasks assessed   SENSATION: WFL, right plantar surface numb  COORDINATION: Able to perform rapid alternating movements  EDEMA:  none  MUSCLE TONE: normal, no spasticity or cogwheeling noted in LLE  MUSCLE LENGTH: Full knee extension in sitting    POSTURE: forward head  LOWER EXTREMITY ROM:     Active  Right Eval Left Eval  Hip flexion    Hip extension    Hip abduction    Hip adduction    Hip internal rotation    Hip external rotation    Knee flexion 110 110  Knee extension 0 0  Ankle dorsiflexion 10 10  Ankle plantarflexion     Ankle inversion    Ankle eversion     (Blank rows = not tested)  LOWER EXTREMITY MMT:    Foot drop/drag on Right ankle  BED MOBILITY:  independent  TRANSFERS: Assistive device utilized: Environmental consultant - 4 wheeled  Sit to stand: Complete Independence Stand to sit: Complete Independence Chair to chair: Complete Independence and Modified independence Floor:  NT   CURB:  Level of Assistance: Modified independence Assistive device utilized: Environmental consultant - 4 wheeled Curb Comments:   STAIRS: Level of Assistance: Modified independence Stair Negotiation Technique: Alternating Pattern  with Bilateral Rails Number of Stairs: 12  Height of Stairs: 4-6"  Comments:   GAIT: Gait pattern: trendelenburg, trunk flexed, and wide BOS Distance walked:  Assistive device utilized: Environmental consultant - 4 wheeled Level of assistance: Modified independence and SBA Comments:   FUNCTIONAL TESTS:  5 times sit to stand: 14 sec Mini-BESTest : 16/28 Berg Balance Test: TBD      PATIENT EDUCATION: Education details: assessment details Person educated: Patient Education method: Explanation Education comprehension: verbalized understanding  HOME EXERCISE PROGRAM: TBD  GOALS: Goals reviewed with patient? Yes  SHORT TERM GOALS: Target date: 01/07/2023    Patient will be independent in HEP to improve functional outcomes Baseline: Goal status: IN PROGRESS  2.  Demo improved balance and reduced risk for falls per score 45/56 Berg Balance Test Baseline: 34/56 12/16/2022 Goal status: IN PROGRESS    LONG TERM GOALS: Target date: 02/04/2023    Demo improved balance, postural control, and reduced risk for falls per score 22/28 Mini-BESTest Baseline: 16/28 Goal status: IN PROGRESS  2.  Demo independence in complex HEP including activities for balance, strength, coordination, and ambulation Baseline:  Goal status: IN PROGRESS  3.  Teach-back appropriate community events/classes for those with PD Baseline:   Goal status: IN PROGRESS    ASSESSMENT:  CLINICAL IMPRESSION: Skilled PT session today performing Berg Balance test, with score 34/56 indicating  increased fall risk.  Initiated HEP for hip strengthening and postural re-education.  Pt has difficulty with RLE single limb stance, despite good MMT for R and L hip strength.  Pt will continue to benefit from skilled PT towards goals for improved strength and balance to decrease fall risk.  OBJECTIVE IMPAIRMENTS: Abnormal gait, decreased activity tolerance, decreased balance, decreased coordination, difficulty walking, and decreased strength.   ACTIVITY LIMITATIONS: carrying, lifting, bending, transfers, and locomotion level  PARTICIPATION LIMITATIONS: cleaning, laundry, shopping, community activity, and yard work  PERSONAL FACTORS: Age, Time since onset of injury/illness/exacerbation, and 1-2 comorbidities: PD, lumbar sx, hip THR and revision  are also affecting patient's functional outcome.   REHAB POTENTIAL: Good  CLINICAL DECISION MAKING: Evolving/moderate complexity  EVALUATION COMPLEXITY: Moderate  PLAN:  PT FREQUENCY: 1-2x/week, 2x/wk x 4 wks then 1xwk/ x 4 wks  PT DURATION: 8 weeks  PLANNED INTERVENTIONS: Therapeutic exercises, Therapeutic activity, Neuromuscular re-education, Balance training, Gait training, Patient/Family education, Self Care, Joint mobilization, Stair training, Vestibular training, Canalith repositioning, DME instructions, Aquatic Therapy, Dry Needling, Electrical stimulation, Spinal mobilization, and Manual therapy  PLAN FOR NEXT SESSION: Review HEP and work on standing hip strengthening, postural exercises.  Lonia Blood, PT 12/16/22 10:14 AM Phone: [phone] Fax: [phone]  [facility] at [facility] [address] [place], [address] [place], [place] [address] Phone # [phone] Fax # [phone]

## 2022-12-18 ENCOUNTER — Encounter: Payer: Self-pay | Admitting: Physical Therapy

## 2022-12-18 ENCOUNTER — Ambulatory Visit: Payer: Medicare Other | Attending: Neurology

## 2022-12-18 ENCOUNTER — Ambulatory Visit: Payer: Medicare Other | Admitting: Physical Therapy

## 2022-12-18 DIAGNOSIS — M6281 Muscle weakness (generalized): Secondary | ICD-10-CM

## 2022-12-18 DIAGNOSIS — R2681 Unsteadiness on feet: Secondary | ICD-10-CM

## 2022-12-18 DIAGNOSIS — R471 Dysarthria and anarthria: Secondary | ICD-10-CM | POA: Diagnosis present

## 2022-12-18 DIAGNOSIS — R2689 Other abnormalities of gait and mobility: Secondary | ICD-10-CM

## 2022-12-18 DIAGNOSIS — R262 Difficulty in walking, not elsewhere classified: Secondary | ICD-10-CM | POA: Diagnosis not present

## 2022-12-18 DIAGNOSIS — R1312 Dysphagia, oropharyngeal phase: Secondary | ICD-10-CM | POA: Insufficient documentation

## 2022-12-18 NOTE — Patient Instructions (Addendum)
  Practice these twice a day:  Mayyy Meee Myyyy Moeeee Moooo  x5 Ah (10 seconds) x5 Glide up - glide down x5

## 2022-12-18 NOTE — Therapy (Signed)
OUTPATIENT PHYSICAL THERAPY NEURO TREATMENT NOTE   Patient Name: Jim Carson MRN: [id number] DOB:05-11-1951, 72 y.o., male Today's Date: 12/18/2022   PCP: Malka So, MD REFERRING PROVIDER:Moore, Samara Deist, MD  END OF SESSION:  PT End of Session - 12/18/22 0933     Visit Number 3    Number of Visits 12    Date for PT Re-Evaluation 02/04/23    Authorization Type Medicare/AARP    PT Start Time 0931    PT Stop Time 1015    PT Time Calculation (min) 44 min    Equipment Utilized During Treatment Gait belt    Activity Tolerance Patient tolerated treatment well    Behavior During Therapy WFL for tasks assessed/performed             Past Medical History:  Diagnosis Date   Abnormal gait    due to parkinson's,  uses cane   Allergic rhinitis    Benign essential hypertension    Cancer (HCC)    hx of skin cancer   Chronic constipation    Deviated septum    GERD (gastroesophageal reflux disease)    History of multiple concussions    per pt as teen playing football, no residual   Hypothyroidism    followed by pcp   Left shoulder pain    OA (osteoarthritis)    OSA on CPAP    Parkinson disease    neurologist-- dr b. 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   @[facility] ,  [place]   desmoid fibromatosis   SHOULDER ARTHROSCOPY WITH ROTATOR CUFF REPAIR Left 12/15/2018   Procedure: SHOULDER ARTHROSCOPY, biceps tenodesis labored Debridement, submacromial decompression;  Surgeon: Eugenia Mcalpine, MD;  Location: [facility]  [place];  Service: Orthopedics;  Laterality: Left;  interscalene block   TOTAL HIP ARTHROPLASTY Left 09/18/2017   @[facility] , [place]   TOTAL HIP REVISION Left 09/10/2021   Procedure: Left hip acetabular versus total hip arthroplasty revision, constrained liner;  Surgeon: Ollen Gross, MD;  Location: [facility];  Service: Orthopedics;  Laterality: Left;   Patient Active Problem List   Diagnosis Date Noted   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HCC) 09/10/2021   Recurrent dislocation of left hip 09/10/2021   Primary osteoarthritis of left hip 05/18/2021   PD (Parkinson's disease) 05/18/2021   History of hypothyroidism 05/18/2021   History of gastroesophageal reflux (GERD) 05/18/2021   OSA (obstructive sleep apnea) 05/18/2021   Essential hypertension 05/18/2021    ONSET DATE: 2022  REFERRING DIAG: G20.A1 (ICD-10-CM) - Parkinson's disease   THERAPY DIAG:  Unsteadiness on feet  Other abnormalities of gait and mobility  Muscle weakness (generalized)  Rationale for Evaluation and Treatment: Rehabilitation  SUBJECTIVE:  SUBJECTIVE STATEMENT: Worked out yesterday with a lot of stretching at [facility].  Pt accompanied by: self  PERTINENT HISTORY:   The last fall he had was with a 3 wheeled walker and it was in January. This is the only fall in the past 6 months.   He has R foot drag and he wears an AFO for support noting the foot rolls.He only wears the AFO when coming to therapy or working out. He reports the foot rolls at other times.   Objective: Timed Up and Go test:15.94 seconds   10 meter walk test: 32.8/12.81 seconds=2.56 ft/sec   5 time sit to stand test:18.60 seconds   Other: Note bilat trendelenberg gait, R foot slap (even with AFO donned), forward leaning trunk posture,  tandem gait due to hip weakness.  PAIN:  Are you having pain? No  PRECAUTIONS: Fall  WEIGHT BEARING RESTRICTIONS: No  FALLS: Has patient fallen in last 6 months? Yes. Number of falls 2  LIVING ENVIRONMENT: Lives with: lives with their family Lives in: House/apartment Stairs: Yes: Internal: 14 stairs to man cave steps; can reach both Has following equipment at home: Dan Humphreys - 4 wheeled  PLOF: Independent with household mobility with device and Independent with community mobility with device  PATIENT GOALS: improve balance  OBJECTIVE:   TODAY'S TREATMENT: 12/18/2022 Activity Comments  Reviewed HEP given last visit: PWR! Up in sitting and standing -seated PWR! Step, 3# -seated marching 3# Added red theraband for postural strengthening  Added UE lifts to seated marching Good trunk control  Added lateral reach to PWR! Step in sitting   Standing strengthening, 3#: Marching 2 x 10 Hip abduction 2 x 10 Hip extension 2 x 10 Hamstring curls 2 x 10 Cues to slow pace of ex  Minisquats with wide BOS, 2 x 10 reps Cues for soft knees to avoid knee recurvatum  TKE with blue theraband, 3 positions, 10 reps   Standing on compliant surface, wide BOS lateral weightshifting, then EO head turns/nods x 5, EC head steady 15 sec Min assist, posterior weightshift         PATIENT EDUCATION: Education details: HEPadditions-add upper body work for exercises below Person educated: Patient Education method: Programmer, multimedia, Verbal cues, and Handouts Education comprehension: verbalized understanding, returned demonstration, and needs further education  Access Code: [email or web address] URL: [email or web address] Date: 12/16/2022 Prepared by: [facility] - Outpatient  Rehab - [facility]  Program Notes Seated and standing PWR! Up (added resistance with band), 2 x 10 repsSeated PWR! Step-step out and in, 3# weight, 3 x 10  Exercises - Seated March  - 1 x daily - 5 x weekly - 3 sets - 10 reps  (added UE lifts) ------------------------------------------------- Objective measures below taken at initial evaluation:  DIAGNOSTIC FINDINGS: n/a for this episode  COGNITION: Overall cognitive status: Within functional limits for tasks assessed   SENSATION: WFL, right plantar surface numb  COORDINATION: Able to perform rapid alternating movements  EDEMA:  none  MUSCLE TONE: normal, no spasticity or cogwheeling noted in LLE  MUSCLE LENGTH: Full knee extension in sitting    POSTURE: forward head  LOWER EXTREMITY ROM:     Active  Right Eval Left Eval  Hip flexion    Hip extension    Hip abduction    Hip adduction    Hip internal rotation    Hip external rotation    Knee flexion 110 110  Knee extension 0 0  Ankle dorsiflexion 10 10  Ankle plantarflexion  Ankle inversion    Ankle eversion     (Blank rows = not tested)  LOWER EXTREMITY MMT:    Foot drop/drag on Right ankle  BED MOBILITY:  independent  TRANSFERS: Assistive device utilized: Environmental consultant - 4 wheeled  Sit to stand: Complete Independence Stand to sit: Complete Independence Chair to chair: Complete Independence and Modified independence Floor:  NT   CURB:  Level of Assistance: Modified independence Assistive device utilized: Environmental consultant - 4 wheeled Curb Comments:   STAIRS: Level of Assistance: Modified independence Stair Negotiation Technique: Alternating Pattern  with Bilateral Rails Number of Stairs: 12  Height of Stairs: 4-6"  Comments:   GAIT: Gait pattern: trendelenburg, trunk flexed, and wide BOS Distance walked:  Assistive device utilized: Environmental consultant - 4 wheeled Level of assistance: Modified independence and SBA Comments:   FUNCTIONAL TESTS:  5 times sit to stand: 14 sec Mini-BESTest : 16/28 Berg Balance Test: TBD      PATIENT EDUCATION: Education details: assessment details Person educated: Patient Education method: Explanation Education comprehension: verbalized  understanding  HOME EXERCISE PROGRAM: TBD  GOALS: Goals reviewed with patient? Yes  SHORT TERM GOALS: Target date: 01/07/2023    Patient will be independent in HEP to improve functional outcomes Baseline: Goal status: IN PROGRESS  2.  Demo improved balance and reduced risk for falls per score 45/56 Berg Balance Test Baseline: 34/56 12/16/2022 Goal status: IN PROGRESS    LONG TERM GOALS: Target date: 02/04/2023    Demo improved balance, postural control, and reduced risk for falls per score 22/28 Mini-BESTest Baseline: 16/28 Goal status: IN PROGRESS  2.  Demo independence in complex HEP including activities for balance, strength, coordination, and ambulation Baseline:  Goal status: IN PROGRESS  3.  Teach-back appropriate community events/classes for those with PD Baseline:  Goal status: IN PROGRESS    ASSESSMENT:  CLINICAL IMPRESSION: Skilled PT session today focused on review of HEP-pt performs well and added upper body/postural exercise in with his HEP.  Worked in standing hip strengthening quad/hamstring strengthening/control.  He is better able to control knee hyperextension with wider BOS and cues for "soft knees".  Pt will continue to benefit from skilled PT towards goals for improved strength and balance to decrease fall risk.  OBJECTIVE IMPAIRMENTS: Abnormal gait, decreased activity tolerance, decreased balance, decreased coordination, difficulty walking, and decreased strength.   ACTIVITY LIMITATIONS: carrying, lifting, bending, transfers, and locomotion level  PARTICIPATION LIMITATIONS: cleaning, laundry, shopping, community activity, and yard work  PERSONAL FACTORS: Age, Time since onset of injury/illness/exacerbation, and 1-2 comorbidities: PD, lumbar sx, hip THR and revision  are also affecting patient's functional outcome.   REHAB POTENTIAL: Good  CLINICAL DECISION MAKING: Evolving/moderate complexity  EVALUATION COMPLEXITY: Moderate  PLAN:  PT  FREQUENCY: 1-2x/week, 2x/wk x 4 wks then 1xwk/ x 4 wks  PT DURATION: 8 weeks  PLANNED INTERVENTIONS: Therapeutic exercises, Therapeutic activity, Neuromuscular re-education, Balance training, Gait training, Patient/Family education, Self Care, Joint mobilization, Stair training, Vestibular training, Canalith repositioning, DME instructions, Aquatic Therapy, Dry Needling, Electrical stimulation, Spinal mobilization, and Manual therapy  PLAN FOR NEXT SESSION: Review updates to HEP and work on standing hip strengthening, postural exercises, balance  Lonia Blood, PT 12/18/22 12:46 PM Phone: [phone] Fax: [phone]  [facility] at [facility] [address], [address] [place], [place] [address] Phone # [phone] Fax # [phone]

## 2022-12-18 NOTE — Therapy (Signed)
OUTPATIENT SPEECH LANGUAGE PATHOLOGY PARKINSON'S TREATMENT   Patient Name: Jim Carson MRN: [id number] DOB:October 14, 1950, 72 y.o., male Today's Date: 12/18/2022  PCP: Malka So., MD REFERRING PROVIDER: Carrie Mew, MD  END OF SESSION:  End of Session - 12/18/22 1022     Visit Number 2    Number of Visits 17    Date for SLP Re-Evaluation 02/14/23    SLP Start Time 1020    SLP Stop Time  1100    SLP Time Calculation (min) 40 min    Activity Tolerance Patient tolerated treatment well             Past Medical History:  Diagnosis Date   Abnormal gait    due to parkinson's,  uses cane   Allergic rhinitis    Benign essential hypertension    Cancer (HCC)    hx of skin cancer   Chronic constipation    Deviated septum    GERD (gastroesophageal reflux disease)    History of multiple concussions    per pt as teen playing football, no residual   Hypothyroidism    followed by pcp   Left shoulder pain    OA (osteoarthritis)    OSA on CPAP    Parkinson disease    neurologist-- dr b. 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   @[facility] ,  [place]   desmoid fibromatosis   SHOULDER ARTHROSCOPY WITH ROTATOR CUFF REPAIR Left 12/15/2018   Procedure: SHOULDER ARTHROSCOPY, biceps tenodesis labored Debridement, submacromial decompression;  Surgeon: Eugenia Mcalpine, MD;  Location: [facility];  Service: Orthopedics;  Laterality: Left;  interscalene block   TOTAL HIP ARTHROPLASTY Left 09/18/2017   @[facility] ,  [place]   TOTAL HIP REVISION Left 09/10/2021   Procedure: Left hip acetabular versus total hip arthroplasty revision, constrained liner;  Surgeon: Ollen Gross, MD;  Location: [facility];  Service: Orthopedics;  Laterality: Left;   Patient Active Problem List   Diagnosis Date Noted   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HCC) 09/10/2021   Recurrent dislocation of left hip 09/10/2021   Primary osteoarthritis of left hip 05/18/2021   PD (Parkinson's disease) 05/18/2021   History of hypothyroidism 05/18/2021   History of gastroesophageal reflux (GERD) 05/18/2021   OSA (obstructive sleep apnea) 05/18/2021   Essential hypertension 05/18/2021    ONSET DATE: 2013 (script dated 11/22/22)  REFERRING DIAG:  G20.A1 (ICD-10-CM) - Parkinson's disease    THERAPY DIAG:  Dysarthria and anarthria  Dysphagia, oropharyngeal phase  Rationale for Evaluation and Treatment: Rehabilitation  SUBJECTIVE:   SUBJECTIVE STATEMENT:  "I did my (swallow) exercises! My wife did one too." Pt accompanied by: self  PERTINENT HISTORY: Patient has past medical history significant for Parkinson's disease diagnosed in 2013 symptomatic starting 2012, gait disturbance, dysphagia, allergic rhinitis, GERD, OSA on CPAP. Prior surgical history includes cervical spine fusion posterior 2013 and patient denies dysphagia following, left hip revision 2020, posterior laminectomy/decompression of lumbar spine, removal  left T1 paraspinous mass. Pt had ST course in 2021 which was successful in improving speech loudness, and participation in swallowing exercises.  PAIN:  Are you having pain? No  FALLS: Has patient fallen in last 6 months?  See PT evaluation for details  PATIENT GOALS: Improve loudness and swallowing  OBJECTIVE:   DIAGNOSTIC FINDINGS:  FINDINGS FROM MBS 12/11/22: (MODIFIED WITH BOLD PRINT) Clinical Impression: Patient swallow function is consistent with prior modified barium swallow study.  Mild  oropharyngeal phase dysphagia continues due to patient's Parkinson's.  Minimal mastication of solids noted despite saying he felt like he chewed well.  Pharyngeal timing of swallow actually appeared improved with swallowing trigger at tongue base, vallecula with pures and piriform sinus with liquids.  Retention mostly vallecular and on the superior surface of epiglottis present due to diminished tongue base retraction and wall motility.  Patient senses retention at the piriform sinus when it is indeed located at the vallecular space. Liquid swallows are most effective to diminish amount of pudding and cracker bolus retained.  Various postures including chin, head turn right, head turn left did not appear to significantly improved pharyngeal clearance.  If any posture was helpful perhaps head turn left may have been slightly helpful.  Cueing patient to expectorate (cough) was noted to clear vallecular retention that he did want clear with dry swallows.     Motility adequacy of musculature varied during the study with epiglottic deflection better with liquids than with solids.  Patient was able to easily swallow barium tablet with liquid without deficits, with no aspiration or penetration..  Of note he did not cough or clear his throat during entire study while swallowing barium however prior to barium administration patient did clear his throat immediately post and water swallow.     Highly recommend patient follow-up with speech pathologist to establish exercise regimen to improve pharyngeal motility.  Recommend he masticates well, follow solids with liquids, maintain strength of cough, voice, and expectoration for airway protection.  Also provided him information regarding a vacuum device for clearance of aspiration for emergent use.  Thank you for this consult.   PATIENT REPORTED OUTCOME MEASURES (PROM): EAT-10:   and Communication Effectiveness Survey: to be provided next session  TODAY'S TREATMENT:                                                                                                                                          DATE:   12/18/22: SWALLOW: reviewed pt's dysphagia HEP. SLP had to re-educate pt about procedure for [facility]. SLP taught pt the alternate to Shaker (CTAR - "chin pushback" on pt's sheet) in previous session due to pt's cervical/back issues.  SPEECH: SLP ordered pt Speak Out book today. Introduced pt to [employer] everyday tasks May-me-my-moe-moo, ah, and glide. Pt with difficulty with breath support due to other dx's. Cue to "Be intentional with your air"  was helpful but pt had to rush May-Moo, and with glide req'd cues for starting hi -lo glide at roughly the same Hz he ended on with lo-hi glide. Pt was independent by session end. Told pt to complete these tasks BID in prep for start of Speak Out.  12/16/22 (eval): SLP reviewed results from [facility] and provided precautions and HEP.   PATIENT EDUCATION: Education details: See "pt instructions" and "today's treatment". Person educated: Patient Education method: Explanation, Demonstration, Verbal cues, and Handouts Education comprehension: verbalized understanding, returned demonstration, verbal cues required, and needs further education  HOME EXERCISE PROGRAM: See Pt Instructions   GOALS: Goals reviewed with patient? Yes  SHORT TERM GOALS: Target date: 01/17/23  Pt will complete dysphagia HEP with modified independence x2 sessions Baseline: Goal status: INITIAL  2.  Pt will perform thorough mastication and alternate bite/sip precautions with POs during session with modified independence in 2 sessions Baseline:  Goal status: INITIAL  3.  Pt will complete vocal warm up tasks (ah, glides, counting, etc) with rare min A in 2 sessions Baseline:  Goal status: INITIAL  4.  Jim Carson will improve speech volume in 5 minutes simple conversation to low 70s dB in 3 sessions Baseline:  Goal status: INITIAL  5.  Pt will  demonstrate completion of home exercises as prescribed 90% compliance in first 2 weeks Baseline:  Goal status: INITIAL   LONG TERM GOALS: Target date: 02/14/23  Pt will complete dysphagia HEP with independence x3 sessions Baseline:  Goal status: INITIAL  2.  Pt will perform thorough mastication and alternate bite/sip precautions with POs during session with independence in 3 sessions Baseline:  Goal status: INITIAL  3.  Pt will complete vocal warm up tasks (ah, glides, counting, etc) with modified independence in 3 sessions Baseline:  Goal status: INITIAL  4.  Jim Carson will improve speech volume in 10 minutes simple-mod complex conversation to low 70s dB in 3 sessions Baseline:  Goal status: INITIAL  5.  In last 1-2 sessions, both of Bobby's PROM scores will improve compared to initial administration Baseline:  Goal status: INITIAL  6.  Jim Carson will tell SLP what HEP to perform in order to maintain WNL volume speech over time Baseline:  Goal status: INITIAL  ASSESSMENT:  CLINICAL IMPRESSION: Patient is a 72 y.o. M who was seen today for treatment of voice (reduced voice volume), and review MBS study and initiate HEP for dysphagia. When he heard a recording of his own voice in unstructured question and answer using intent and effort, "Wow I never thought it could sound like that, I need to make that a habit." Pt completed dysphagia HEP with occasional min A faded to independent. He told SLP his list of precautions at two different times in the session today, and will be provided with this list next session.   OBJECTIVE IMPAIRMENTS: Objective impairments include dysarthria and dysphagia. These impairments are limiting patient from household responsibilities, ADLs/IADLs, effectively communicating at home and in community, and safety when swallowing.Factors affecting potential to achieve goals and functional outcome are  none noted .Marland Kitchen Patient will benefit from skilled SLP services to  address above impairments and improve overall function.  REHAB POTENTIAL: Good  PLAN:  SLP FREQUENCY: 2x/week  SLP DURATION: 8 weeks  PLANNED INTERVENTIONS: Aspiration precaution training, Pharyngeal strengthening exercises, Diet toleration management , Environmental controls, Internal/external aids, Oral motor exercises, Functional tasks, SLP instruction and feedback, Compensatory strategies, and Patient/family education    [facility], CCC-SLP 12/18/2022, 10:23 AM

## 2022-12-23 ENCOUNTER — Ambulatory Visit: Payer: Medicare Other | Admitting: Speech Pathology

## 2022-12-23 ENCOUNTER — Ambulatory Visit: Payer: Medicare Other | Admitting: Physical Therapy

## 2022-12-23 ENCOUNTER — Encounter: Payer: Self-pay | Admitting: Physical Therapy

## 2022-12-23 DIAGNOSIS — M6281 Muscle weakness (generalized): Secondary | ICD-10-CM

## 2022-12-23 DIAGNOSIS — R262 Difficulty in walking, not elsewhere classified: Secondary | ICD-10-CM | POA: Diagnosis not present

## 2022-12-23 DIAGNOSIS — R1312 Dysphagia, oropharyngeal phase: Secondary | ICD-10-CM

## 2022-12-23 DIAGNOSIS — R2681 Unsteadiness on feet: Secondary | ICD-10-CM

## 2022-12-23 DIAGNOSIS — R471 Dysarthria and anarthria: Secondary | ICD-10-CM

## 2022-12-23 DIAGNOSIS — R2689 Other abnormalities of gait and mobility: Secondary | ICD-10-CM

## 2022-12-23 NOTE — Therapy (Signed)
OUTPATIENT SPEECH LANGUAGE PATHOLOGY PARKINSON'S TREATMENT   Patient Name: Jim Carson MRN: [id number] DOB:Jul 05, 1950, 72 y.o., male Today's Date: 12/23/2022  PCP: Malka So., MD REFERRING PROVIDER: Carrie Mew, MD  END OF SESSION:  End of Session - 12/23/22 1530     Visit Number 3    Number of Visits 17    Date for SLP Re-Evaluation 02/14/23    SLP Start Time 1445    SLP Stop Time  1530    SLP Time Calculation (min) 45 min    Activity Tolerance Patient tolerated treatment well              Past Medical History:  Diagnosis Date   Abnormal gait    due to parkinson's,  uses cane   Allergic rhinitis    Benign essential hypertension    Cancer (HCC)    hx of skin cancer   Chronic constipation    Deviated septum    GERD (gastroesophageal reflux disease)    History of multiple concussions    per pt as teen playing football, no residual   Hypothyroidism    followed by pcp   Left shoulder pain    OA (osteoarthritis)    OSA on CPAP    Parkinson disease    neurologist-- dr b. Lorin Picket  @[facility]   Pneumonia    Vitamin B12 deficiency    Vitamin D deficiency    Wears glasses    Past Surgical History:  Procedure Laterality Date   APPENDECTOMY  07/1964   LAPAROSCOPIC CHOLECYSTECTOMY  01-21-2017   @[facility] , [place]   POSTERIOR FUSION CERVICAL SPINE  08-15-2011   @[facility] , [place]   w/ laminectomy and decompression-- C3 -- T1   POSTERIOR LAMINECTOMY / DECOMPRESSION LUMBAR SPINE  07-30-2013   @[facility] , [place]   bilateral T11 - 12,  L2 --5   REMOVAL LEFT T1 PARASPINOUS MASS  02-12-2016   @[facility] ,  [place]   desmoid fibromatosis   SHOULDER ARTHROSCOPY WITH ROTATOR CUFF REPAIR Left 12/15/2018   Procedure: SHOULDER ARTHROSCOPY, biceps tenodesis labored Debridement, submacromial decompression;  Surgeon: Eugenia Mcalpine, MD;  Location: [facility] [place];  Service: Orthopedics;  Laterality: Left;  interscalene block   TOTAL HIP ARTHROPLASTY Left 09/18/2017    @[facility] , [place]   TOTAL HIP REVISION Left 09/10/2021   Procedure: Left hip acetabular versus total hip arthroplasty revision, constrained liner;  Surgeon: Ollen Gross, MD;  Location: [facility];  Service: Orthopedics;  Laterality: Left;   Patient Active Problem List   Diagnosis Date Noted   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HCC) 09/10/2021   Recurrent dislocation of left hip 09/10/2021   Primary osteoarthritis of left hip 05/18/2021   PD (Parkinson's disease) 05/18/2021   History of hypothyroidism 05/18/2021   History of gastroesophageal reflux (GERD) 05/18/2021   OSA (obstructive sleep apnea) 05/18/2021   Essential hypertension 05/18/2021    ONSET DATE: 2013 (script dated 11/22/22)  REFERRING DIAG:  G20.A1 (ICD-10-CM) - Parkinson's disease    THERAPY DIAG:  Dysarthria and anarthria  Dysphagia, oropharyngeal phase  Rationale for Evaluation and Treatment: Rehabilitation  SUBJECTIVE:   SUBJECTIVE STATEMENT:  Pt reports to daily completion of swallow, every other day completion of dysarthria HEP.  Pt accompanied by: self  PERTINENT HISTORY: Patient has past medical history significant for Parkinson's disease diagnosed in 2013 symptomatic starting 2012, gait disturbance, dysphagia, allergic rhinitis, GERD, OSA on CPAP. Prior surgical history includes cervical spine fusion posterior 2013 and patient denies dysphagia following, left hip revision 2020,  posterior laminectomy/decompression of lumbar spine, removal left T1 paraspinous mass. Pt had ST course in 2021 which was successful in improving speech loudness, and participation in swallowing exercises.  PAIN:  Are you having pain? No  FALLS: Has patient fallen in last 6 months?  See PT evaluation for details  PATIENT GOALS: Improve loudness and swallowing  OBJECTIVE:   DIAGNOSTIC FINDINGS:  FINDINGS FROM MBS 12/11/22: (MODIFIED WITH BOLD PRINT) Clinical Impression: Patient swallow function is consistent with prior  modified barium swallow study.  Mild oropharyngeal phase dysphagia continues due to patient's Parkinson's.  Minimal mastication of solids noted despite saying he felt like he chewed well.  Pharyngeal timing of swallow actually appeared improved with swallowing trigger at tongue base, vallecula with pures and piriform sinus with liquids.  Retention mostly vallecular and on the superior surface of epiglottis present due to diminished tongue base retraction and wall motility.  Patient senses retention at the piriform sinus when it is indeed located at the vallecular space. Liquid swallows are most effective to diminish amount of pudding and cracker bolus retained.  Various postures including chin, head turn right, head turn left did not appear to significantly improved pharyngeal clearance.  If any posture was helpful perhaps head turn left may have been slightly helpful.  Cueing patient to expectorate (cough) was noted to clear vallecular retention that he did want clear with dry swallows.     Motility adequacy of musculature varied during the study with epiglottic deflection better with liquids than with solids.  Patient was able to easily swallow barium tablet with liquid without deficits, with no aspiration or penetration..  Of note he did not cough or clear his throat during entire study while swallowing barium however prior to barium administration patient did clear his throat immediately post and water swallow.     Highly recommend patient follow-up with speech pathologist to establish exercise regimen to improve pharyngeal motility.  Recommend he masticates well, follow solids with liquids, maintain strength of cough, voice, and expectoration for airway protection.  Also provided him information regarding a vacuum device for clearance of aspiration for emergent use.  Thank you for this consult.   PATIENT REPORTED OUTCOME MEASURES (PROM): Question Patient's Response  My swallowing problem has caused me  to lose weight 0  2.  My swallowing problem interferes with my ability to go out to meals 0  3.  Swallowing liquids takes extra effort 0  4.  Swallowing solids takes extra effort 2  5.  Swallowing pills takes extra effort 0  6.  Swallowing is painful 0  7.  The pleasure of eating is affected by my swallowing 1  8.  When I swallow food sticks in my throat 2  9.  I cough when I eat 3  10.  Swallowing is stressful  2    Communication Effectiveness Survey        How effective is your speech in... Pt Rating  Having a conversation with family/friends 2  2.   Conversing with strangers 2  3.   Talking to someone familiar on the phone 2.5  4.   Talking to someone unfamiliar on the phone 2.5  5.   Being part of conversation in a noisy environment  2.5  6.   Speaking to someone when angry or upset 3  7.   Conversing while travelling in the car 2.5  8.   Conversing across distance  3   TODAY'S TREATMENT:  DATE:   12/23/22: SWALLOW: pt demonstrates 5/5 swallow exercises with mod-I, reports to daily implementation. Using tally marks to ensure completion of all exercises.  SPEECH: SLP re-introduced pt to speak out, explained intentional speech and it's role  Target improving vocal quality and increasing intensity through progressively difficulty speech tasks using Speak Out! program, lesson 1. ST leads pt through exercises providing usual model prior to pt execution. usual mod-A required to achieve target dB this date. Averages this date:  Sustained "ah" 91 dB Reading (phrases) 80 dB Cognitive speech task 78 dB Conversational task:  Benefiting from tactile cues for improved breath support during warm up tasks, though  some tasks were modified to accommodate cognitive load and decreased breath support. Conversational sample of approx 5 minutes, pt averages 71 dB with  occasional min-A. Goal is to complete warm up exercises of speak out daily between this and next session.    12/18/22: SWALLOW: reviewed pt's dysphagia HEP. SLP had to re-educate pt about procedure for [facility]. SLP taught pt the alternate to [employer] (CTAR - "chin pushback" on pt's sheet) in previous session due to pt's cervical/back issues.  SPEECH: SLP ordered pt Speak Out book today. Introduced pt to [employer] everyday tasks May-me-my-moe-moo, ah, and glide. Pt with difficulty with breath support due to other dx's. Cue to "Be intentional with your air" was helpful but pt had to rush May-Moo, and with glide req'd cues for starting hi -lo glide at roughly the same Hz he ended on with lo-hi glide. Pt was independent by session end. Told pt to complete these tasks BID in prep for start of Speak Out.  12/16/22 (eval): SLP reviewed results from [facility] and provided precautions and HEP.   PATIENT EDUCATION: Education details: See "pt instructions" and "today's treatment". Person educated: Patient Education method: Explanation, Demonstration, Verbal cues, and Handouts Education comprehension: verbalized understanding, returned demonstration, verbal cues required, and needs further education  HOME EXERCISE PROGRAM: See Pt Instructions   GOALS: Goals reviewed with patient? Yes  SHORT TERM GOALS: Target date: 01/17/23  Pt will complete dysphagia HEP with modified independence x2 sessions Baseline: Goal status: INITIAL  2.  Pt will perform thorough mastication and alternate bite/sip precautions with POs during session with modified independence in 2 sessions Baseline:  Goal status: INITIAL  3.  Pt will complete vocal warm up tasks (ah, glides, counting, etc) with rare min A in 2 sessions Baseline:  Goal status: INITIAL  4.  Reita Cliche will improve speech volume in 5 minutes simple conversation to low 70s dB in 3 sessions Baseline:  Goal status: INITIAL  5.  Pt will demonstrate completion of home  exercises as prescribed 90% compliance in first 2 weeks Baseline:  Goal status: INITIAL   LONG TERM GOALS: Target date: 02/14/23  Pt will complete dysphagia HEP with independence x3 sessions Baseline:  Goal status: INITIAL  2.  Pt will perform thorough mastication and alternate bite/sip precautions with POs during session with independence in 3 sessions Baseline:  Goal status: INITIAL  3.  Pt will complete vocal warm up tasks (ah, glides, counting, etc) with modified independence in 3 sessions Baseline:  Goal status: INITIAL  4.  Reita Cliche will improve speech volume in 10 minutes simple-mod complex conversation to low 70s dB in 3 sessions Baseline:  Goal status: INITIAL  5.  In last 1-2 sessions, both of Bobby's PROM scores will improve compared to initial administration Baseline:  Goal status: INITIAL  6.  Reita Cliche will tell SLP what  HEP to perform in order to maintain WNL volume speech over time Baseline:  Goal status: INITIAL  ASSESSMENT:  CLINICAL IMPRESSION: Patient is a 72 y.o. M who was seen today for treatment of voice (reduced voice volume), and review MBS study and initiate HEP for dysphagia. When he heard a recording of his own voice in unstructured question and answer using intent and effort, "Wow I never thought it could sound like that, I need to make that a habit." Pt completed dysphagia HEP with occasional min A faded to independent. He told SLP his list of precautions at two different times in the session today, and will be provided with this list next session.   OBJECTIVE IMPAIRMENTS: Objective impairments include dysarthria and dysphagia. These impairments are limiting patient from household responsibilities, ADLs/IADLs, effectively communicating at home and in community, and safety when swallowing.Factors affecting potential to achieve goals and functional outcome are  none noted .Marland Kitchen Patient will benefit from skilled SLP services to address above impairments and  improve overall function.  REHAB POTENTIAL: Good  PLAN:  SLP FREQUENCY: 2x/week  SLP DURATION: 8 weeks  PLANNED INTERVENTIONS: Aspiration precaution training, Pharyngeal strengthening exercises, Diet toleration management , Environmental controls, Internal/external aids, Oral motor exercises, Functional tasks, SLP instruction and feedback, Compensatory strategies, and Patient/family education    Maia Breslow, CCC-SLP 12/23/2022, 3:30 PM

## 2022-12-23 NOTE — Therapy (Signed)
OUTPATIENT PHYSICAL THERAPY NEURO TREATMENT NOTE   Patient Name: Jim Carson MRN: [id number] DOB:05-11-1951, 72 y.o., male Today's Date: 12/18/2022   PCP: Malka So, MD REFERRING PROVIDER:Moore, Samara Deist, MD  END OF SESSION:  PT End of Session - 12/18/22 0933     Visit Number 3    Number of Visits 12    Date for PT Re-Evaluation 02/04/23    Authorization Type Medicare/AARP    PT Start Time 0931    PT Stop Time 1015    PT Time Calculation (min) 44 min    Equipment Utilized During Treatment Gait belt    Activity Tolerance Patient tolerated treatment well    Behavior During Therapy WFL for tasks assessed/performed             Past Medical History:  Diagnosis Date   Abnormal gait    due to parkinson's,  uses cane   Allergic rhinitis    Benign essential hypertension    Cancer (HCC)    hx of skin cancer   Chronic constipation    Deviated septum    GERD (gastroesophageal reflux disease)    History of multiple concussions    per pt as teen playing football, no residual   Hypothyroidism    followed by pcp   Left shoulder pain    OA (osteoarthritis)    OSA on CPAP    Parkinson disease    neurologist-- dr b. 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   @[facility] ,  [place]   desmoid fibromatosis   SHOULDER ARTHROSCOPY WITH ROTATOR CUFF REPAIR Left 12/15/2018   Procedure: SHOULDER ARTHROSCOPY, biceps tenodesis labored Debridement, submacromial decompression;  Surgeon: Eugenia Mcalpine, MD;  Location: [facility]  [place];  Service: Orthopedics;  Laterality: Left;  interscalene block   TOTAL HIP ARTHROPLASTY Left 09/18/2017   @[facility] , [place]   TOTAL HIP REVISION Left 09/10/2021   Procedure: Left hip acetabular versus total hip arthroplasty revision, constrained liner;  Surgeon: Ollen Gross, MD;  Location: [facility];  Service: Orthopedics;  Laterality: Left;   Patient Active Problem List   Diagnosis Date Noted   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HCC) 09/10/2021   Recurrent dislocation of left hip 09/10/2021   Primary osteoarthritis of left hip 05/18/2021   PD (Parkinson's disease) 05/18/2021   History of hypothyroidism 05/18/2021   History of gastroesophageal reflux (GERD) 05/18/2021   OSA (obstructive sleep apnea) 05/18/2021   Essential hypertension 05/18/2021    ONSET DATE: 2022  REFERRING DIAG: G20.A1 (ICD-10-CM) - Parkinson's disease   THERAPY DIAG:  Unsteadiness on feet  Other abnormalities of gait and mobility  Muscle weakness (generalized)  Rationale for Evaluation and Treatment: Rehabilitation  SUBJECTIVE:  SUBJECTIVE STATEMENT: Wife has a question about therapeutic massage.  Did do the [employer] this morning via zoom, so I'm tired.  Pt accompanied by: self  PERTINENT HISTORY:   The last fall he had was with a 3 wheeled walker and it was in January. This is the only fall in the past 6 months.   He has R foot drag and he wears an AFO for support noting the foot rolls.He only wears the AFO when coming to therapy or working out. He reports the foot rolls at other times.   Objective: Timed Up and Go test:15.94 seconds   10 meter walk test: 32.8/12.81 seconds=2.56 ft/sec   5 time sit to stand test:18.60 seconds   Other: Note bilat trendelenberg gait, R foot  slap (even with AFO donned), forward leaning trunk posture, tandem gait due to hip weakness.  PAIN:  Are you having pain? No  PRECAUTIONS: Fall  WEIGHT BEARING RESTRICTIONS: No  FALLS: Has patient fallen in last 6 months? Yes. Number of falls 2  LIVING ENVIRONMENT: Lives with: lives with their family Lives in: House/apartment Stairs: Yes: Internal: 14 stairs to man cave steps; can reach both Has following equipment at home: Dan Humphreys - 4 wheeled  PLOF: Independent with household mobility with device and Independent with community mobility with device  PATIENT GOALS: improve balance  OBJECTIVE:       TODAY'S TREATMENT: 12/23/2022 Activity Comments  PWR! Up in sitting and standing -seated PWR! Step, 3#, with side reach -seated marching 3# with UE lifts   Good trunk control  Sit to stand x 5 reps from mat surface, with one episode of retropulsion Responds well to cues for glut activation to help with posture, decreased retropulsion  Standing strengthening, 3#: Marching 2 x 10 Hip abduction 2 x 10 Hip extension 2 x 10 Hamstring curls 2 x 10 Cues to slow pace of ex  Minisquats with wide BOS, 2 x 10 reps Cues for soft knees to avoid knee recurvatum  Forward step taps to 6" step, then forward taps>hip extension runner's stretch position BUE support, 10 reps each ex, each leg  Forward step ups to 6" step x 10 reps   Gait with rollator, 50 ft x 4 reps, with cues for glut activation Upright posture, decreased forward lean on rollator, improved R foot clearance noted         PATIENT EDUCATION: Education details: Talked briefly with wife via phone at end of session to answer questions about massage.  She wanted to know if PT could write order for massage on regular basis so that insurance would cover.  Requested that she and patient reach out to MD and ask about documentation for that, as we cannot write those orders/unsure that insurance would recognize our notes for that  information/request Person educated: Patient Education method: Explanation, Verbal cues, and Handouts Education comprehension: verbalized understanding, returned demonstration, and needs further education  Access Code: [email or web address] URL: [email or web address] Date: 12/16/2022 Prepared by: [facility] - Outpatient  Rehab - [facility]  Program Notes Seated and standing PWR! Up (added resistance with band), 2 x 10 repsSeated PWR! Step-step out and in, 3# weight, 3 x 10  Exercises - Seated March  - 1 x daily - 5 x weekly - 3 sets - 10 reps (added UE lifts) ------------------------------------------------- Objective measures below taken at initial evaluation:  DIAGNOSTIC FINDINGS: n/a for this episode  COGNITION: Overall cognitive status: Within functional limits for tasks assessed   SENSATION: WFL, right  plantar surface numb  COORDINATION: Able to perform rapid alternating movements  EDEMA:  none  MUSCLE TONE: normal, no spasticity or cogwheeling noted in LLE  MUSCLE LENGTH: Full knee extension in sitting    POSTURE: forward head  LOWER EXTREMITY ROM:     Active  Right Eval Left Eval  Hip flexion    Hip extension    Hip abduction    Hip adduction    Hip internal rotation    Hip external rotation    Knee flexion 110 110  Knee extension 0 0  Ankle dorsiflexion 10 10  Ankle plantarflexion    Ankle inversion    Ankle eversion     (Blank rows = not tested)  LOWER EXTREMITY MMT:    Foot drop/drag on Right ankle  BED MOBILITY:  independent  TRANSFERS: Assistive device utilized: Environmental consultant - 4 wheeled  Sit to stand: Complete Independence Stand to sit: Complete Independence Chair to chair: Complete Independence and Modified independence Floor:  NT   CURB:  Level of Assistance: Modified independence Assistive device utilized: Environmental consultant - 4 wheeled Curb Comments:   STAIRS: Level of Assistance: Modified independence Stair Negotiation Technique:  Alternating Pattern  with Bilateral Rails Number of Stairs: 12  Height of Stairs: 4-6"  Comments:   GAIT: Gait pattern: trendelenburg, trunk flexed, and wide BOS Distance walked:  Assistive device utilized: Environmental consultant - 4 wheeled Level of assistance: Modified independence and SBA Comments:   FUNCTIONAL TESTS:  5 times sit to stand: 14 sec Mini-BESTest : 16/28 Berg Balance Test: TBD      PATIENT EDUCATION: Education details: assessment details Person educated: Patient Education method: Explanation Education comprehension: verbalized understanding  HOME EXERCISE PROGRAM: TBD  GOALS: Goals reviewed with patient? Yes  SHORT TERM GOALS: Target date: 01/07/2023    Patient will be independent in HEP to improve functional outcomes Baseline: Goal status: IN PROGRESS  2.  Demo improved balance and reduced risk for falls per score 45/56 Berg Balance Test Baseline: 34/56 12/16/2022 Goal status: IN PROGRESS    LONG TERM GOALS: Target date: 02/04/2023    Demo improved balance, postural control, and reduced risk for falls per score 22/28 Mini-BESTest Baseline: 16/28 Goal status: IN PROGRESS  2.  Demo independence in complex HEP including activities for balance, strength, coordination, and ambulation Baseline:  Goal status: IN PROGRESS  3.  Teach-back appropriate community events/classes for those with PD Baseline:  Goal status: IN PROGRESS    ASSESSMENT:  CLINICAL IMPRESSION: Skilled PT session today focused on postural and lower body strengthening.  Worked on ways to be more successful with SLS-wider BOS to start, glut activation, UE support.  He especially responds well to cues for glut activation with standing and gait activities.  This slows him down more than he is used to, so he will need more repetition and work on this.  Pt will continue to benefit from skilled PT towards goals for improved strength and balance to decrease fall risk.  OBJECTIVE IMPAIRMENTS:  Abnormal gait, decreased activity tolerance, decreased balance, decreased coordination, difficulty walking, and decreased strength.   ACTIVITY LIMITATIONS: carrying, lifting, bending, transfers, and locomotion level  PARTICIPATION LIMITATIONS: cleaning, laundry, shopping, community activity, and yard work  PERSONAL FACTORS: Age, Time since onset of injury/illness/exacerbation, and 1-2 comorbidities: PD, lumbar sx, hip THR and revision  are also affecting patient's functional outcome.   REHAB POTENTIAL: Good  CLINICAL DECISION MAKING: Evolving/moderate complexity  EVALUATION COMPLEXITY: Moderate  PLAN:  PT FREQUENCY: 1-2x/week, 2x/wk x 4  wks then 1xwk/ x 4 wks  PT DURATION: 8 weeks  PLANNED INTERVENTIONS: Therapeutic exercises, Therapeutic activity, Neuromuscular re-education, Balance training, Gait training, Patient/Family education, Self Care, Joint mobilization, Stair training, Vestibular training, Canalith repositioning, DME instructions, Aquatic Therapy, Dry Needling, Electrical stimulation, Spinal mobilization, and Manual therapy  PLAN FOR NEXT SESSION: Continue work on standing hip strengthening, postural exercises, balance  Lonia Blood, PT 12/18/22 12:46 PM Phone: [phone] Fax: [phone]  [facility] at [facility] [address], [address] [place], [place] [address] Phone # [phone] Fax # [phone]

## 2022-12-24 NOTE — Therapy (Signed)
OUTPATIENT PHYSICAL THERAPY NEURO TREATMENT NOTE   Patient Name: Jim Carson MRN: [id number] DOB:September 29, 1950, 72 y.o., male Today's Date: 12/25/2022   PCP: Malka So, MD REFERRING PROVIDER:Moore, Samara Deist, MD  END OF SESSION:  PT End of Session - 12/25/22 1014     Visit Number 5    Number of Visits 12    Date for PT Re-Evaluation 02/04/23    Authorization Type Medicare/AARP    PT Start Time 0935    PT Stop Time 1013    PT Time Calculation (min) 38 min    Equipment Utilized During Treatment Gait belt    Activity Tolerance Patient tolerated treatment well    Behavior During Therapy WFL for tasks assessed/performed              Past Medical History:  Diagnosis Date   Abnormal gait    due to parkinson's,  uses cane   Allergic rhinitis    Benign essential hypertension    Cancer (HCC)    hx of skin cancer   Chronic constipation    Deviated septum    GERD (gastroesophageal reflux disease)    History of multiple concussions    per pt as teen playing football, no residual   Hypothyroidism    followed by pcp   Left shoulder pain    OA (osteoarthritis)    OSA on CPAP    Parkinson disease    neurologist-- dr b. Lorin Picket  @[facility]   Pneumonia    Vitamin B12 deficiency    Vitamin D deficiency    Wears glasses    Past Surgical History:  Procedure Laterality Date   APPENDECTOMY  07/1964   LAPAROSCOPIC CHOLECYSTECTOMY  01-21-2017   @[facility] , [place]   POSTERIOR FUSION CERVICAL SPINE  08-15-2011   @[facility] , [place]   w/ laminectomy and decompression-- C3 -- T1   POSTERIOR LAMINECTOMY / DECOMPRESSION LUMBAR SPINE  07-30-2013   @[facility] , [place]   bilateral T11 - 12,  L2 --5   REMOVAL LEFT T1 PARASPINOUS MASS  02-12-2016   @[facility] ,  [place]   desmoid fibromatosis   SHOULDER ARTHROSCOPY WITH ROTATOR CUFF REPAIR Left 12/15/2018   Procedure: SHOULDER ARTHROSCOPY, biceps tenodesis labored Debridement, submacromial decompression;  Surgeon: Eugenia Mcalpine, MD;  Location: [facility]  [place];  Service: Orthopedics;  Laterality: Left;  interscalene block   TOTAL HIP ARTHROPLASTY Left 09/18/2017   @[facility] , [place]   TOTAL HIP REVISION Left 09/10/2021   Procedure: Left hip acetabular versus total hip arthroplasty revision, constrained liner;  Surgeon: Ollen Gross, MD;  Location: [facility];  Service: Orthopedics;  Laterality: Left;   Patient Active Problem List   Diagnosis Date Noted   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HCC) 09/10/2021   Recurrent dislocation of left hip 09/10/2021   Primary osteoarthritis of left hip 05/18/2021   PD (Parkinson's disease) 05/18/2021   History of hypothyroidism 05/18/2021   History of gastroesophageal reflux (GERD) 05/18/2021   OSA (obstructive sleep apnea) 05/18/2021   Essential hypertension 05/18/2021    ONSET DATE: 2022  REFERRING DIAG: G20.A1 (ICD-10-CM) - Parkinson's disease   THERAPY DIAG:  Unsteadiness on feet  Other abnormalities of gait and mobility  Muscle weakness (generalized)  Difficulty in walking, not elsewhere classified  Rationale for Evaluation and Treatment: Rehabilitation  SUBJECTIVE:  SUBJECTIVE STATEMENT: Back is a little stiff.   Pt accompanied by: self  PERTINENT HISTORY:   The last fall he had was with a 3 wheeled walker and it was in January. This is the only fall in the past 6 months.   He has R foot drag and he wears an AFO for support noting the foot rolls.He only wears the AFO when coming to therapy or working out. He reports the foot rolls at other times.   Objective: Timed Up and Go test:15.94 seconds   10 meter walk test: 32.8/12.81 seconds=2.56 ft/sec   5 time sit to stand test:18.60 seconds   Other: Note bilat trendelenberg gait, R foot slap (even with AFO donned), forward  leaning trunk posture, tandem gait due to hip weakness.  PAIN:  Are you having pain? Yes: NPRS scale: 3-4/10 Pain location: L LB Pain description: discomfort Aggravating factors: stretching Relieving factors: standing  PRECAUTIONS: Fall  WEIGHT BEARING RESTRICTIONS: No  FALLS: Has patient fallen in last 6 months? Yes. Number of falls 2  LIVING ENVIRONMENT: Lives with: lives with their family Lives in: House/apartment Stairs: Yes: Internal: 14 stairs to man cave steps; can reach both Has following equipment at home: Dan Humphreys - 4 wheeled  PLOF: Independent with household mobility with device and Independent with community mobility with device  PATIENT GOALS: improve balance  OBJECTIVE:     TODAY'S TREATMENT: 12/25/22 Activity Comments  Sitting prayer stretch with pball 5x5" To each diagonal 5x5" Good tolerance  open book stretch Cueing to maintain knees together; limited ROM  sidelying hip ABD/ADD Cueing to maintain proper alignment; tendency to externally rotate with hip ABD; opposite LE resting on bolster with hip ADD  Bridge with green TB 2x10  Cues for rhythmic breathing d/t tendency for valsalva; cues to utilize glutes to avoid HS cramps   sidestep ups 2x5 4" step, 2x5 6" step B UE support and CGA; much more difficulty on L LE  standing hip hikes on step Heavy manual and verbal cueing; limited ROM           Access Code: [email or web address] URL: [email or web address] Date: 12/15/2022 Prepared by: [facility] - Outpatient  Rehab - [facility]  Program Notes Seated and standing PWR! Up (added resistance with band), 2 x 10 repsSeated PWR! Step-step out and in, 3# weight, 3 x 10  Exercises - Seated March  - 1 x daily - 5 x weekly - 3 sets - 10 reps (added UE lifts) - Seated Thoracic Flexion and Rotation with Swiss Ball  - 1 x daily - 5 x weekly - 2 sets - 10 reps - 5 sec hold  PATIENT EDUCATION: Education details: HEP update  Person educated:  Patient Education method: Explanation, Demonstration, Tactile cues, Verbal cues, and Handouts Education comprehension: verbalized understanding and returned demonstration   ------------------------------------------------- Objective measures below taken at initial evaluation:  DIAGNOSTIC FINDINGS: n/a for this episode  COGNITION: Overall cognitive status: Within functional limits for tasks assessed   SENSATION: WFL, right plantar surface numb  COORDINATION: Able to perform rapid alternating movements  EDEMA:  none  MUSCLE TONE: normal, no spasticity or cogwheeling noted in LLE  MUSCLE LENGTH: Full knee extension in sitting    POSTURE: forward head  LOWER EXTREMITY ROM:     Active  Right Eval Left Eval  Hip flexion    Hip extension    Hip abduction    Hip adduction    Hip internal rotation    Hip external rotation  Knee flexion 110 110  Knee extension 0 0  Ankle dorsiflexion 10 10  Ankle plantarflexion    Ankle inversion    Ankle eversion     (Blank rows = not tested)  LOWER EXTREMITY MMT:    Foot drop/drag on Right ankle  BED MOBILITY:  independent  TRANSFERS: Assistive device utilized: Environmental consultant - 4 wheeled  Sit to stand: Complete Independence Stand to sit: Complete Independence Chair to chair: Complete Independence and Modified independence Floor:  NT   CURB:  Level of Assistance: Modified independence Assistive device utilized: Environmental consultant - 4 wheeled Curb Comments:   STAIRS: Level of Assistance: Modified independence Stair Negotiation Technique: Alternating Pattern  with Bilateral Rails Number of Stairs: 12  Height of Stairs: 4-6"  Comments:   GAIT: Gait pattern: trendelenburg, trunk flexed, and wide BOS Distance walked:  Assistive device utilized: Environmental consultant - 4 wheeled Level of assistance: Modified independence and SBA Comments:   FUNCTIONAL TESTS:  5 times sit to stand: 14 sec Mini-BESTest : 16/28 Berg Balance Test:  TBD      PATIENT EDUCATION: Education details: assessment details Person educated: Patient Education method: Explanation Education comprehension: verbalized understanding  HOME EXERCISE PROGRAM: TBD  GOALS: Goals reviewed with patient? Yes  SHORT TERM GOALS: Target date: 01/07/2023    Patient will be independent in HEP to improve functional outcomes Baseline: Goal status: IN PROGRESS  2.  Demo improved balance and reduced risk for falls per score 45/56 Berg Balance Test Baseline: 34/56 12/16/2022 Goal status: IN PROGRESS    LONG TERM GOALS: Target date: 02/04/2023    Demo improved balance, postural control, and reduced risk for falls per score 22/28 Mini-BESTest Baseline: 16/28 Goal status: IN PROGRESS  2.  Demo independence in complex HEP including activities for balance, strength, coordination, and ambulation Baseline:  Goal status: IN PROGRESS  3.  Teach-back appropriate community events/classes for those with PD Baseline:  Goal status: IN PROGRESS    ASSESSMENT:  CLINICAL IMPRESSION: Patient arrived to session with report of some L LB discomfort. Addressed this with gentle stretching. Patient with limited trunk rotation with stretching activities, but tolerated this well. Hip strengthening required cueing for proper alignment- patient with good effort and carryover with these activities.  Standing activities revealed hip instability and L>R LE weakness. No complaints at end of session.   OBJECTIVE IMPAIRMENTS: Abnormal gait, decreased activity tolerance, decreased balance, decreased coordination, difficulty walking, and decreased strength.   ACTIVITY LIMITATIONS: carrying, lifting, bending, transfers, and locomotion level  PARTICIPATION LIMITATIONS: cleaning, laundry, shopping, community activity, and yard work  PERSONAL FACTORS: Age, Time since onset of injury/illness/exacerbation, and 1-2 comorbidities: PD, lumbar sx, hip THR and revision  are also  affecting patient's functional outcome.   REHAB POTENTIAL: Good  CLINICAL DECISION MAKING: Evolving/moderate complexity  EVALUATION COMPLEXITY: Moderate  PLAN:  PT FREQUENCY: 1-2x/week, 2x/wk x 4 wks then 1xwk/ x 4 wks  PT DURATION: 8 weeks  PLANNED INTERVENTIONS: Therapeutic exercises, Therapeutic activity, Neuromuscular re-education, Balance training, Gait training, Patient/Family education, Self Care, Joint mobilization, Stair training, Vestibular training, Canalith repositioning, DME instructions, Aquatic Therapy, Dry Needling, Electrical stimulation, Spinal mobilization, and Manual therapy  PLAN FOR NEXT SESSION: Continue work on standing hip strengthening, postural exercises, balance    Anette Guarneri, PT, DPT 12/25/22 10:15 AM  [facility] at [facility] [address], [address] [place], [place] [address] Phone # [phone] Fax # [phone]

## 2022-12-25 ENCOUNTER — Ambulatory Visit: Payer: Medicare Other | Admitting: Physical Therapy

## 2022-12-25 ENCOUNTER — Encounter: Payer: Self-pay | Admitting: Physical Therapy

## 2022-12-25 DIAGNOSIS — R2681 Unsteadiness on feet: Secondary | ICD-10-CM

## 2022-12-25 DIAGNOSIS — R262 Difficulty in walking, not elsewhere classified: Secondary | ICD-10-CM | POA: Diagnosis not present

## 2022-12-25 DIAGNOSIS — M6281 Muscle weakness (generalized): Secondary | ICD-10-CM

## 2022-12-25 DIAGNOSIS — R2689 Other abnormalities of gait and mobility: Secondary | ICD-10-CM

## 2022-12-30 ENCOUNTER — Ambulatory Visit (INDEPENDENT_AMBULATORY_CARE_PROVIDER_SITE_OTHER): Payer: Medicare Other | Admitting: Family Medicine

## 2022-12-30 ENCOUNTER — Ambulatory Visit: Payer: Medicare Other | Attending: Neurology | Admitting: Physical Therapy

## 2022-12-30 ENCOUNTER — Encounter: Payer: Self-pay | Admitting: Physical Therapy

## 2022-12-30 ENCOUNTER — Encounter: Payer: Self-pay | Admitting: Family Medicine

## 2022-12-30 VITALS — BP 128/82 | HR 70 | Temp 97.0°F | Ht 68.0 in | Wt 199.4 lb

## 2022-12-30 DIAGNOSIS — M6281 Muscle weakness (generalized): Secondary | ICD-10-CM | POA: Insufficient documentation

## 2022-12-30 DIAGNOSIS — I10 Essential (primary) hypertension: Secondary | ICD-10-CM | POA: Diagnosis not present

## 2022-12-30 DIAGNOSIS — R471 Dysarthria and anarthria: Secondary | ICD-10-CM | POA: Diagnosis present

## 2022-12-30 DIAGNOSIS — G20B1 Parkinson's disease with dyskinesia, without mention of fluctuations: Secondary | ICD-10-CM

## 2022-12-30 DIAGNOSIS — R29818 Other symptoms and signs involving the nervous system: Secondary | ICD-10-CM | POA: Insufficient documentation

## 2022-12-30 DIAGNOSIS — R1312 Dysphagia, oropharyngeal phase: Secondary | ICD-10-CM | POA: Insufficient documentation

## 2022-12-30 DIAGNOSIS — R262 Difficulty in walking, not elsewhere classified: Secondary | ICD-10-CM | POA: Diagnosis present

## 2022-12-30 DIAGNOSIS — K5903 Drug induced constipation: Secondary | ICD-10-CM | POA: Diagnosis not present

## 2022-12-30 DIAGNOSIS — R2681 Unsteadiness on feet: Secondary | ICD-10-CM | POA: Insufficient documentation

## 2022-12-30 DIAGNOSIS — R2689 Other abnormalities of gait and mobility: Secondary | ICD-10-CM | POA: Diagnosis present

## 2022-12-30 DIAGNOSIS — M1612 Unilateral primary osteoarthritis, left hip: Secondary | ICD-10-CM

## 2022-12-30 DIAGNOSIS — E039 Hypothyroidism, unspecified: Secondary | ICD-10-CM

## 2022-12-30 MED ORDER — DICLOFENAC SODIUM 1 % EX GEL
4.0000 g | Freq: Four times a day (QID) | CUTANEOUS | 3 refills | Status: DC | PRN
Start: 2022-12-30 — End: 2023-09-18

## 2022-12-30 MED ORDER — DICLOFENAC SODIUM 1 % EX GEL
4.0000 g | Freq: Four times a day (QID) | CUTANEOUS | 3 refills | Status: DC | PRN
Start: 2022-12-30 — End: 2022-12-30

## 2022-12-30 NOTE — Patient Instructions (Signed)
VISIT SUMMARY:  During your visit, we discussed your concerns about Parkinson's disease, lower back discomfort, high blood pressure, and hypothyroidism. We also reviewed your current medications and discussed potential changes to your treatment plan.  YOUR PLAN:  -PARKINSON'S DISEASE: Parkinson's disease is a progressive nervous system disorder that affects movement. Your symptoms, including balance issues and voice changes, are being managed with your current medications. You will continue with your current medication regimen and follow up with Dr. Christell Constant next week.  -LOWER BACK PAIN: Your lower back pain is localized to your left lower hip, the site of your previous hip replacement. We discussed the use of Robaxin for muscle spasms and the prescription of Voltaren gel for topical pain relief. We also discussed the possibility of reestablishing care with [facility] for possible targeted injections.  -HYPERTENSION: Hypertension, or high blood pressure, is a condition where the force of the blood against the artery walls is too high. You will continue with your current medication regimen of Losartan and Hydrochlorothiazide.  -HYPOTHYROIDISM: Hypothyroidism is a condition where your thyroid gland doesn't produce enough thyroid hormones. You will continue with your current medication regimen of Synthroid.  INSTRUCTIONS:  For general health maintenance, continue taking Aspirin daily for cardiovascular disease prevention. Use Miralax as needed for constipation related to your Parkinson's medications.

## 2022-12-30 NOTE — Progress Notes (Signed)
Assessment/Plan:   Problem List Items Addressed This Visit       Cardiovascular and Mediastinum   Essential hypertension - Primary     Diagnosed in 2005, currently managed with Losartan 100mg  daily and Hydrochlorothiazide 25mg  daily. -Continue current medication regimen.       Relevant Medications   aspirin EC 81 MG tablet     Digestive   Drug induced constipation    -Continue use of Miralax and docusate as needed for constipation related to Parkinson's medications.        Endocrine   Acquired hypothyroidism    Diagnosed in 1995, currently managed with Synthroid daily. -Continue current medication regimen.         Nervous and Auditory   PD (Parkinson's disease)    Progressive symptoms including balance issues and voice changes. Currently managed with Sinemet (25/100 long acting at night and 25/100 short acting three and a half tablets three times a day), Pramipexole 0.5mg  twice a day, and Amantadine 100mg  twice a day. Followed by Dr. Christell Constant at [facility] movement disorder clinic. -Continue current medication regimen. -Follow-up with Dr. Christell Constant next week.       Relevant Medications   Amantadine HCl 100 MG tablet     Musculoskeletal and Integument   Primary osteoarthritis of left hip    Pain localized to left lower hip, site of previous hip replacement. History of steroid injection for similar pain. Currently undergoing physical therapy and personal training. -Consider use of Robaxin as needed for muscle spasms. -Prescribe Voltaren (diclofenac) gel for topical pain relief. -Consider reestablishing care with [facility] for possible targeted injections.      Relevant Medications   aspirin EC 81 MG tablet   diclofenac Sodium (VOLTAREN) 1 % GEL    Medications Discontinued During This Encounter  Medication Reason   rasagiline (AZILECT) 1 MG TABS tablet    diclofenac Sodium (VOLTAREN) 1 % GEL Reorder    Return if symptoms worsen or fail to improve.     Subjective:   Encounter date: 12/30/2022  Jim Carson is a 72 y.o. male who has Primary osteoarthritis of left hip; PD (Parkinson's disease); History of gastroesophageal reflux (GERD); OSA (obstructive sleep apnea); Essential hypertension; Drug induced constipation; and Acquired hypothyroidism on their problem list..   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   Discussed the use of AI scribe software for clinical note transcription with the patient, who gave verbal consent to proceed.  History of Present Illness   The patient, Jim Carson, presents with concerns about Parkinson's disease and lower back discomfort. The back pain is localized to the left lower hip, the same side where a hip replacement was performed. The patient has a history of high blood pressure, diagnosed around 2005, and is currently on losartan 100 mg and hydrochlorothiazide 25 mg daily.  Regarding Parkinson's disease, the patient is on Sinemet, taking a 25/100 long-acting dose at night and three and a half tablets of the 25/100 mg short-acting dose three times a day. He also takes primepaxil 0.5 mg  twice a day and amantadine 100 mg twice a day. The patient has been under the care of Dr. Christell Constant at [facility] since around 2007.  The patient also has a history of hypothyroidism, diagnosed in 1995, and has been on Synthroid 75 mcg since then. He also  takes an aspirin daily for preventative measures due to a family history of heart disease.  The patient has been experiencing a decline in balance over the past year, which he attributes to the progression of Parkinson's disease. He has been actively participating in physical therapy and exercise to manage this.  The patient also reports using desonide ointment for pain relief in the knee, and Robaxin as needed for muscle spasms. He has a history of arthritis in the wrist, which is believed to be osteoarthritis rather than rheumatoid or other inflammatory causes.  The patient has been experiencing constipation and takes Docusate as needed. He also has a history of swallowing difficulties, but a recent swallowing study showed no aspiration retention.       Review of Systems  Gastrointestinal:  Positive for constipation.  Musculoskeletal:  Positive for back pain and joint pain.  Neurological:  Positive for tremors and weakness.    Past Surgical History:  Procedure Laterality Date   APPENDECTOMY  07/1964   JOINT REPLACEMENT  March 2019 and again on 09/10/2021   Left hip twice.  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-12-2016   @[facility] ,  [place]   desmoid fibromatosis   SHOULDER ARTHROSCOPY WITH ROTATOR CUFF REPAIR Left 12/15/2018   Procedure: SHOULDER ARTHROSCOPY, biceps tenodesis labored Debridement, submacromial decompression;  Surgeon: Eugenia Mcalpine, MD;  Location: [facility];  Service: Orthopedics;  Laterality: Left;  interscalene block   SPINE SURGERY     TOTAL HIP ARTHROPLASTY Left 09/18/2017   @[facility] , [place]   TOTAL HIP REVISION Left 09/10/2021   Procedure:  Left hip acetabular versus total hip arthroplasty revision, constrained liner;  Surgeon: Ollen Gross, MD;  Location: [facility];  Service: Orthopedics;  Laterality: Left;    Outpatient Medications Prior to Visit  Medication Sig Dispense Refill   Amantadine HCl 100 MG tablet      aspirin EC 81 MG tablet Take by mouth.     carbidopa-levodopa (SINEMET IR) 25-100 MG tablet Take 3.5 tablets by mouth 3 (three) times daily.     Carbidopa-Levodopa ER (SINEMET CR) 25-100 MG tablet controlled release Take 1 tablet by mouth at bedtime.      Cholecalciferol (VITAMIN D3) 125 MCG (5000 UT) CAPS Take 5,000 Units by mouth in the morning.     desonide (DESOWEN) 0.05 % ointment Apply 1 application topically daily as needed (irritation).     docusate sodium (COLACE) 100 MG capsule Take 100 mg by mouth daily as needed for moderate constipation.     hydrochlorothiazide (HYDRODIURIL) 25 MG tablet Take 25 mg by mouth in the morning.     ipratropium (ATROVENT) 0.06 % nasal spray Place 2 sprays into both nostrils 2 (two) times daily as needed for rhinitis.     levothyroxine (SYNTHROID, LEVOTHROID) 75 MCG tablet Take 75 mcg by mouth daily before breakfast.  linaclotide (LINZESS) 290 MCG CAPS capsule Take 290 mcg by mouth daily as needed (constipation).     losartan (COZAAR) 100 MG tablet Take 100 mg by mouth in the morning.     MAGNESIUM PO Take by mouth.     methocarbamol (ROBAXIN) 500 MG tablet Take 1 tablet (500 mg total) by mouth every 6 (six) hours as needed for muscle spasms. 40 tablet 0   pramipexole (MIRAPEX) 0.5 MG tablet Take 0.5 mg by mouth in the morning and at bedtime.     vitamin B-12 (CYANOCOBALAMIN) 1000 MCG tablet Take 1,000 mcg by mouth daily.     rasagiline (AZILECT) 1 MG TABS tablet Take 1 mg by mouth in the morning.     No facility-administered medications prior to visit.    Family History  Problem Relation Age of Onset   Heart disease Mother    Alcohol abuse Father    Prostate cancer  Brother    Cancer Brother     Social History   Socioeconomic History   Marital status: Married    Spouse name: Not on file   Number of children: Not on file   Years of education: Not on file   Highest education level: Not on file  Occupational History   Not on file  Tobacco Use   Smoking status: Never   Smokeless tobacco: Never  Vaping Use   Vaping Use: Never used  Substance and Sexual Activity   Alcohol use: Yes    Alcohol/week: 8.0 standard drinks of alcohol    Types: 7 Glasses of wine, 1 Cans of beer per week    Comment: I drink a glass of wine with diner.  I like beer with pizza   Drug use: Never   Sexual activity: Yes    Birth control/protection: None  Other Topics Concern   Not on file  Social History Narrative   ** Merged History Encounter **       Social Determinants of Health   Financial Resource Strain: Not on file  Food Insecurity: Not on file  Transportation Needs: Not on file  Physical Activity: Not on file  Stress: Not on file  Social Connections: Not on file  Intimate Partner Violence: Not on file                                                                                                  Objective:  Physical Exam: BP 128/82 (BP Location: Left Arm, Patient Position: Sitting, Cuff Size: Large)   Pulse 70   Temp (!) 97 F (36.1 C) (Temporal)   Ht 5\' 8"  (1.727 m)   Wt 199 lb 6.4 oz (90.4 kg)   SpO2 99%   BMI 30.32 kg/m     Physical Exam Constitutional:      Appearance: Normal appearance.  HENT:     Head: Normocephalic and atraumatic.     Right Ear: Hearing normal.     Left Ear: Hearing normal.     Nose: Nose normal.  Eyes:     General: No scleral icterus.       Right  eye: No discharge.        Left eye: No discharge.     Extraocular Movements: Extraocular movements intact.  Cardiovascular:     Rate and Rhythm: Normal rate and regular rhythm.     Heart sounds: Normal heart sounds.  Pulmonary:     Effort: Pulmonary effort is  normal.     Breath sounds: Normal breath sounds.  Abdominal:     Palpations: Abdomen is soft.     Tenderness: There is no abdominal tenderness.  Skin:    General: Skin is warm.     Findings: No rash.  Neurological:     General: No focal deficit present.     Mental Status: He is alert.     Motor: Tremor present.     Gait: Gait abnormal (Uses walker).  Psychiatric:        Behavior: Behavior normal.        Thought Content: Thought content normal.        Judgment: Judgment normal.    Physical Exam   CHEST: No abnormalities on auscultation. ABDOMEN: No abdominal tenderness on palpation. EXTREMITIES: No edema.      Last metabolic panel Lab Results  Component Value Date   GLUCOSE 128 (H) 09/11/2021   NA 139 09/11/2021   K 4.4 09/11/2021   CL 102 09/11/2021   CO2 28 09/11/2021   BUN 14 09/11/2021   CREATININE 1.00 09/11/2021   GFRNONAA >60 09/11/2021   CALCIUM 8.8 (L) 09/11/2021   PROT 7.4 08/22/2021   ALBUMIN 4.4 08/22/2021   BILITOT 0.5 08/22/2021   ALKPHOS 58 08/22/2021   AST 16 08/22/2021   ALT 11 08/22/2021   ANIONGAP 9 09/11/2021   Lab Results  Component Value Date   WBC 11.5 (H) 09/11/2021   HGB 12.7 (L) 09/11/2021   HCT 38.2 (L) 09/11/2021   MCV 89.9 09/11/2021   PLT 148 (L) 09/11/2021   No results found for: "CHOL", "HDL", "LDLCALC", "LDLDIRECT", "TRIG", "CHOLHDL" No results found for: "COLORU", "CLARITYU", "GLUCOSEUR", "BILIRUBINUR", "KETONESU", "SPECGRAV", "RBCUR", "PHUR", "PROTEINUR", "UROBILINOGEN", "LEUKOCYTESUR"  Results   LABS TSH: normal (2022)  DIAGNOSTIC Swallowing study: No aspiration or retention      DG SWALLOW FUNC OP MEDICARE SPEECH PATH  Result Date: 12/11/2022 Table formatting from the original result was not included. Modified Barium Swallow Study Patient Details Name: HARLES KAUK MRN: [id number] Date of Birth: February 28, 1951 Today's Date: 12/11/2022 HPI/PMH: HPI: Mr. Sermersheim is a 72 year old male referred by Dr. Christell Constant for modified barium  swallow study.  Patient has past medical history significant for Parkinson's disease diagnosed in 2013 symptomatic starting 2012, gait disturbance, dysphagia, allergic rhinitis, GERD,  OSA on CPAP.  Prior surgical history includes cervical spine fusion posterior 2013 and patient denies dysphagia following, left hip revision 2020, posterior laminectomy/decompression of lumbar spine, removal left T1 paraspinous mass.  Patient reports increased issues with swallowing causing him to cough over the last 7 to 8 months he denies requiring Heimlich maneuver, having pneumonias, nor losing weight unintentionally.  Prior modified barium swallow study completed in 2020 showed mild oropharyngeal dysphagia and patient reports he had followed up with exercise.. Clinical Impression: Clinical Impression: Patient swallow function is consistent with prior modified barium swallow study.  Mild oropharyngeal phase dysphagia continues due to patient's Parkinson's.  Minimal mastication of solids noted despite saying he felt like he chewed well.  Pharyngeal timing of swallow actually appeared improved with swallowing trigger at tongue base, vallecula with pures and piriform sinus with liquids.  Retention mostly vallecular and on the superior surface of epiglottis present due to diminished tongue base retraction and wall motility.  Patient senses retention at the piriform sinus when it is indeed located at the vallecular space. Liquid swallows are most effective to diminish amount of pudding and cracker bolus retained.  Various postures including chin, head turn right, head turn left did not appear to significantly improved pharyngeal clearance.  If any posture was helpful perhaps head turn left may have been slightly helpful.  Cueing patient to expectorate (cough) was noted to clear vallecular retention that he did want clear with dry swallows.  Motility adequacy of musculature varied during the study with epiglottic deflection better with  liquids than with solids.  Patient was able to easily swallow barium tablet with liquid without deficits, with no aspiration or penetration..  Of note he did not cough or clear his throat during entire study while swallowing barium however prior to barium administration patient did clear his throat immediately post and water swallow.  Highly recommend patient follow-up with speech pathologist to establish exercise regimen to improve pharyngeal motility.  Recommend he masticates well, follow solids with liquids, maintain strength of cough, voice, and expectoration for airway protection.  Also provided him information regarding a vacuum device for clearance of aspiration for emergent use.  Thank you for this consult. Factors that may increase risk of adverse event in presence of aspiration Rubye Oaks & Clearance Coots 2021): No data recorded Recommendations/Plan: Swallowing Evaluation Recommendations Swallowing Evaluation Recommendations Recommendations: PO diet PO Diet Recommendation: Regular; Thin liquids (Level 0) Liquid Administration via: Cup; Straw Medication Administration: Whole meds with liquid Supervision: Patient able to self-feed Swallowing strategies  : Slow rate; Small bites/sips; Follow solids with liquids (Expectorate after meals) Postural changes: Position pt fully upright for meals; Stay upright 30-60 min after meals Oral care recommendations: Oral care BID (2x/day) Treatment Plan No data recorded Recommendations Recommendations for follow up therapy are one component of a multi-disciplinary discharge planning process, led by the attending physician.  Recommendations may be updated based on patient status, additional functional criteria and insurance authorization. Assessment: Orofacial Exam: Orofacial Exam Oral Cavity: Oral Hygiene: WFL Oral Cavity - Dentition: Adequate natural dentition Orofacial Anatomy: WFL Oral Motor/Sensory Function: Generalized oral weakness Anatomy: Anatomy: Suspected cervical  osteophytes; Presence of cervical hardware Boluses Administered: Boluses Administered Boluses Administered: Thin liquids (Level 0); Mildly thick liquids (Level 2, nectar thick); Moderately thick liquids (Level 3, honey thick); Puree; Solid  Oral Impairment Domain: Oral Impairment Domain Lip Closure: No labial escape Tongue control during bolus hold: Cohesive bolus between tongue to palatal seal Bolus preparation/mastication: -- (inadequate mastication) Bolus transport/lingual motion: Brisk tongue motion Oral residue: Trace residue lining oral structures Initiation of pharyngeal swallow : Pyriform sinuses  Pharyngeal Impairment Domain: Pharyngeal Impairment Domain Soft palate elevation: No bolus between soft palate (SP)/pharyngeal wall (PW) Laryngeal elevation: Partial superior movement of thyroid cartilage/partial approximation of arytenoids to epiglottic petiole Anterior hyoid excursion: Partial anterior movement Epiglottic movement: Partial inversion (larger bolus size with liquids faciliated epiglottic deflection) Laryngeal vestibule closure: Incomplete, narrow column air/contrast in laryngeal vestibule Pharyngeal stripping wave : Present - diminished Pharyngeal contraction (A/P view only): N/A Pharyngoesophageal segment opening: Complete distension and complete duration, no obstruction of flow Tongue base retraction: Trace column of contrast or air between tongue base and PPW Pharyngeal residue: Collection of residue within or on pharyngeal structures Location of pharyngeal residue: Valleculae; Pharyngeal wall; Tongue base (on superior surface of epilgottis)  Esophageal Impairment Domain: Esophageal Impairment  Domain Esophageal clearance upright position: Complete clearance, esophageal coating Pill: Esophageal Impairment Domain Esophageal clearance upright position: Complete clearance, esophageal coating Penetration/Aspiration Scale Score: Penetration/Aspiration Scale Score 1.  Material does not enter airway:  Moderately thick liquids (Level 3, honey thick); Puree; Solid; Pill 2.  Material enters airway, remains ABOVE vocal cords then ejected out: Mildly thick liquids (Level 2, nectar thick); Thin liquids (Level 0) Compensatory Strategies: No data recorded  General Information: Caregiver present: No  Diet Prior to this Study: Regular; Thin liquids (Level 0)   Temperature : Normal   Respiratory Status: WFL   No data recorded  History of Recent Intubation: No  Behavior/Cognition: Alert; Cooperative; Pleasant mood Self-Feeding Abilities: Able to self-feed Baseline vocal quality/speech: Hypophonia/low volume Volitional Cough: Able to elicit Volitional Swallow: Able to elicit Exam Limitations: No limitations Goal Planning: No data recorded No data recorded No data recorded No data recorded No data recorded Pain: Pain Assessment Pain Assessment: No/denies pain End of Session: Start Time:SLP Start Time (ACUTE ONLY): 1309 Stop Time: SLP Stop Time (ACUTE ONLY): 1349 Time Calculation:SLP Time Calculation (min) (ACUTE ONLY): 40 min Charges: SLP Evaluations $ SLP Speech Visit: 1 Visit SLP Evaluations $MBS Swallow: 1 Procedure $Swallowing Treatment: 1 Procedure Rolena Infante, MS [facility] SLP Acute Rehab Services Office [phone] SLP visit diagnosis: SLP Visit Diagnosis: Dysphagia, oropharyngeal phase (R13.12) Past Medical History: Past Medical History: Diagnosis Date  Abnormal gait   due to parkinson's,  uses cane  Allergic rhinitis   Benign essential hypertension   Cancer (HCC)   hx of skin cancer  Chronic constipation   Deviated septum   GERD (gastroesophageal reflux disease)   History of multiple concussions   per pt as teen playing football, no residual  Hypothyroidism   followed by pcp  Left shoulder pain   OA (osteoarthritis)   OSA on CPAP   Parkinson disease   neurologist-- dr b. Lorin Picket  @[facility]  Pneumonia   Vitamin B12 deficiency   Vitamin D deficiency   Wears glasses  Past Surgical History: Past Surgical History: Procedure  Laterality Date  APPENDECTOMY  07/1964  LAPAROSCOPIC CHOLECYSTECTOMY  01-21-2017   @[facility] , [place]  POSTERIOR FUSION CERVICAL SPINE  08-15-2011   @[facility] , [place]  w/ laminectomy and decompression-- C3 -- T1  POSTERIOR LAMINECTOMY / DECOMPRESSION LUMBAR SPINE  07-30-2013   @[facility] , [place]  bilateral T11 - 12,  L2 --5  REMOVAL LEFT T1 PARASPINOUS MASS  02-12-2016   @[facility] ,  [place]  desmoid fibromatosis  SHOULDER ARTHROSCOPY WITH ROTATOR CUFF REPAIR Left 12/15/2018  Procedure: SHOULDER ARTHROSCOPY, biceps tenodesis labored Debridement, submacromial decompression;  Surgeon: Eugenia Mcalpine, MD;  Location: [facility] ;  Service: Orthopedics;  Laterality: Left;  interscalene block  TOTAL HIP ARTHROPLASTY Left 09/18/2017   @[facility] , [place]  TOTAL HIP REVISION Left 09/10/2021  Procedure: Left hip acetabular versus total hip arthroplasty revision, constrained liner;  Surgeon: Ollen Gross, MD;  Location: [facility];  Service: Orthopedics;  Laterality: Left; Chales Abrahams 12/11/2022, 2:16 PM CLINICAL DATA:  Dysphagia. Cough/GE reflux disease/other secondary diagnosis EXAM: MODIFIED BARIUM SWALLOW TECHNIQUE: Different consistencies of barium were administered orally to the patient by the Speech Pathologist. Imaging of the pharynx was performed in the lateral projection. The radiologist was present in the fluoroscopy room for this study, providing personal supervision. FLUOROSCOPY: Radiation Exposure Index (as provided by the fluoroscopic device): 4.4 mGy Kerma COMPARISON:  None Available. FINDINGS: Vestibular  Penetration:  None seen. Aspiration:  None seen. Other: Moderate retention of solids and  puree at the level of the epiglottis IMPRESSION: No aspiration.  Retention as above.  See speech pathology Please refer to the Speech Pathologists report for complete details and recommendations. Electronically Signed   By: Genevive Bi M.D.   On: 12/11/2022 13:55   No results found for this or any previous visit  (from the past 2160 hour(s)).      Garner Nash, MD, MS

## 2022-12-30 NOTE — Therapy (Signed)
OUTPATIENT PHYSICAL THERAPY NEURO TREATMENT NOTE   Patient Name: Jim Carson MRN: [id number] DOB:1951/05/25, 72 y.o., male Today's Date: 12/30/2022   PCP: Malka So, MD REFERRING PROVIDER:Moore, Samara Deist, MD  END OF SESSION:  PT End of Session - 12/30/22 0932     Visit Number 6    Number of Visits 12    Date for PT Re-Evaluation 02/04/23    Authorization Type Medicare/AARP    PT Start Time 0934    PT Stop Time 1013    PT Time Calculation (min) 39 min    Equipment Utilized During Treatment Gait belt    Activity Tolerance Patient tolerated treatment well    Behavior During Therapy WFL for tasks assessed/performed              Past Medical History:  Diagnosis Date   Abnormal gait    due to parkinson's,  uses cane   Allergic rhinitis    Benign essential hypertension    Cancer (HCC)    hx of skin cancer   Chronic constipation    Deviated septum    GERD (gastroesophageal reflux disease)    History of multiple concussions    per pt as teen playing football, no residual   Hypothyroidism    followed by pcp   Left shoulder pain    OA (osteoarthritis)    OSA on CPAP    Parkinson disease    neurologist-- dr b. 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   @[facility] ,  [place]   desmoid fibromatosis   SHOULDER ARTHROSCOPY WITH ROTATOR CUFF REPAIR Left 12/15/2018   Procedure: SHOULDER ARTHROSCOPY, biceps tenodesis labored Debridement, submacromial decompression;  Surgeon: Eugenia Mcalpine, MD;  Location: [facility]  [place];  Service: Orthopedics;  Laterality: Left;  interscalene block   TOTAL HIP ARTHROPLASTY Left 09/18/2017   @[facility] , [place]   TOTAL HIP REVISION Left 09/10/2021   Procedure: Left hip acetabular versus total hip arthroplasty revision, constrained liner;  Surgeon: Ollen Gross, MD;  Location: [facility];  Service: Orthopedics;  Laterality: Left;   Patient Active Problem List   Diagnosis Date Noted   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HCC) 09/10/2021   Recurrent dislocation of left hip 09/10/2021   Primary osteoarthritis of left hip 05/18/2021   PD (Parkinson's disease) 05/18/2021   History of hypothyroidism 05/18/2021   History of gastroesophageal reflux (GERD) 05/18/2021   OSA (obstructive sleep apnea) 05/18/2021   Essential hypertension 05/18/2021    ONSET DATE: 2022  REFERRING DIAG: G20.A1 (ICD-10-CM) - Parkinson's disease   THERAPY DIAG:  Unsteadiness on feet  Other abnormalities of gait and mobility  Muscle weakness (generalized)  Rationale for Evaluation and Treatment: Rehabilitation  SUBJECTIVE:  SUBJECTIVE STATEMENT: No c/o today.  Back not bad today.     Pt accompanied by: self  PERTINENT HISTORY:   The last fall he had was with a 3 wheeled walker and it was in January. This is the only fall in the past 6 months.   He has R foot drag and he wears an AFO for support noting the foot rolls.He only wears the AFO when coming to therapy or working out. He reports the foot rolls at other times.   Objective: Timed Up and Go test:15.94 seconds   10 meter walk test: 32.8/12.81 seconds=2.56 ft/sec   5 time sit to stand test:18.60 seconds   Other: Note bilat trendelenberg gait, R foot slap (even with AFO donned), forward leaning trunk posture, tandem gait due to  hip weakness.  PAIN:  Are you having pain? Yes: NPRS scale: 1/10 Pain location: L LB Pain description: discomfort Aggravating factors: stretching Relieving factors: standing  PRECAUTIONS: Fall  WEIGHT BEARING RESTRICTIONS: No  FALLS: Has patient fallen in last 6 months? Yes. Number of falls 2  LIVING ENVIRONMENT: Lives with: lives with their family Lives in: House/apartment Stairs: Yes: Internal: 14 stairs to man cave steps; can reach both Has following equipment at home: Dan Humphreys - 4 wheeled  PLOF: Independent with household mobility with device and Independent with community mobility with device  PATIENT GOALS: improve balance  OBJECTIVE:    TODAY'S TREATMENT: 12/30/2022 Activity Comments  open book stretch, 5 reps 10 sec Limited range of motion, good position  sidelying hip ABD 2 x 10 reps Cueing to maintain proper alignment; tendency to externally rotate with hip ABD  Bridge with green TB 2x10 Good form, no cramping today  Hooklying hip abduction, green theraband, 2 x 10 Cues for technique  sidestep ups 2 x 10, 6" step B UE support and CGA; much more difficulty on L LE  Forward step ups 2 x 10, 6" step BUE support, recurvatum BLE  Standing balance solid surface: -Feet apart EO head turns/nods and EC head steady -Feet together as above -Feet partial tandem as above Mild unsteadiness with EC, but able to maintain balance  Hip strategy work wall bumps, 5 reps, then low back/hip stretch Tendency for posterior lean  Standing wide BOS trunk rotation reaching and looking behind, x 10 reps Unsteadiness at rep 9 each direction         Access Code: [email or web address] URL: [email or web address] Date: 12/15/2022 Prepared by: [facility] - Outpatient  Rehab - [facility]  Program Notes Seated and standing PWR! Up (added resistance with band), 2 x 10 repsSeated PWR! Step-step out and in, 3# weight, 3 x 10  Exercises - Seated March  - 1 x daily - 5 x weekly - 3 sets -  10 reps (added UE lifts) - Seated Thoracic Flexion and Rotation with Swiss Ball  - 1 x daily - 5 x weekly - 2 sets - 10 reps - 5 sec hold  PATIENT EDUCATION: Education details: HEP update  Person educated: Patient Education method: Explanation, Demonstration, Tactile cues, Verbal cues, and Handouts Education comprehension: verbalized understanding and returned demonstration   ------------------------------------------------- Objective measures below taken at initial evaluation:  DIAGNOSTIC FINDINGS: n/a for this episode  COGNITION: Overall cognitive status: Within functional limits for tasks assessed   SENSATION: WFL, right plantar surface numb  COORDINATION: Able to perform rapid alternating movements  EDEMA:  none  MUSCLE TONE: normal, no spasticity or cogwheeling noted in LLE  MUSCLE LENGTH: Full knee extension  in sitting    POSTURE: forward head  LOWER EXTREMITY ROM:     Active  Right Eval Left Eval  Hip flexion    Hip extension    Hip abduction    Hip adduction    Hip internal rotation    Hip external rotation    Knee flexion 110 110  Knee extension 0 0  Ankle dorsiflexion 10 10  Ankle plantarflexion    Ankle inversion    Ankle eversion     (Blank rows = not tested)  LOWER EXTREMITY MMT:    Foot drop/drag on Right ankle  BED MOBILITY:  independent  TRANSFERS: Assistive device utilized: Environmental consultant - 4 wheeled  Sit to stand: Complete Independence Stand to sit: Complete Independence Chair to chair: Complete Independence and Modified independence Floor:  NT   CURB:  Level of Assistance: Modified independence Assistive device utilized: Environmental consultant - 4 wheeled Curb Comments:   STAIRS: Level of Assistance: Modified independence Stair Negotiation Technique: Alternating Pattern  with Bilateral Rails Number of Stairs: 12  Height of Stairs: 4-6"  Comments:   GAIT: Gait pattern: trendelenburg, trunk flexed, and wide BOS Distance walked:  Assistive  device utilized: Environmental consultant - 4 wheeled Level of assistance: Modified independence and SBA Comments:   FUNCTIONAL TESTS:  5 times sit to stand: 14 sec Mini-BESTest : 16/28 Berg Balance Test: TBD      PATIENT EDUCATION: Education details: assessment details Person educated: Patient Education method: Explanation Education comprehension: verbalized understanding  HOME EXERCISE PROGRAM: TBD  GOALS: Goals reviewed with patient? Yes  SHORT TERM GOALS: Target date: 01/07/2023    Patient will be independent in HEP to improve functional outcomes Baseline: Goal status: IN PROGRESS  2.  Demo improved balance and reduced risk for falls per score 45/56 Berg Balance Test Baseline: 34/56 12/16/2022 Goal status: IN PROGRESS    LONG TERM GOALS: Target date: 02/04/2023    Demo improved balance, postural control, and reduced risk for falls per score 22/28 Mini-BESTest Baseline: 16/28 Goal status: IN PROGRESS  2.  Demo independence in complex HEP including activities for balance, strength, coordination, and ambulation Baseline:  Goal status: IN PROGRESS  3.  Teach-back appropriate community events/classes for those with PD Baseline:  Goal status: IN PROGRESS    ASSESSMENT:  CLINICAL IMPRESSION: Skilled PT session worked on trunk flexibility, hip and lower extremity strengthening as well as balance.  Pt continues to have increased weakness LLE > RLE.  With standing balance exercises, he is able to perform static EO and EC with no UE support and CGA.  Difficulty incorporating hip strategy into balance, as he tends to have increased retropulsion, posterior lean when trying to come forward.  He will continue to benefit from skilled PT towards goals for improved functional mobility and decreased fall risk.  OBJECTIVE IMPAIRMENTS: Abnormal gait, decreased activity tolerance, decreased balance, decreased coordination, difficulty walking, and decreased strength.   ACTIVITY LIMITATIONS:  carrying, lifting, bending, transfers, and locomotion level  PARTICIPATION LIMITATIONS: cleaning, laundry, shopping, community activity, and yard work  PERSONAL FACTORS: Age, Time since onset of injury/illness/exacerbation, and 1-2 comorbidities: PD, lumbar sx, hip THR and revision  are also affecting patient's functional outcome.   REHAB POTENTIAL: Good  CLINICAL DECISION MAKING: Evolving/moderate complexity  EVALUATION COMPLEXITY: Moderate  PLAN:  PT FREQUENCY: 1-2x/week, 2x/wk x 4 wks then 1xwk/ x 4 wks  PT DURATION: 8 weeks  PLANNED INTERVENTIONS: Therapeutic exercises, Therapeutic activity, Neuromuscular re-education, Balance training, Gait training, Patient/Family education, Self Care, Joint mobilization,  Stair training, Vestibular training, Canalith repositioning, DME instructions, Aquatic Therapy, Dry Needling, Electrical stimulation, Spinal mobilization, and Manual therapy  PLAN FOR NEXT SESSION: Check STGs next week.  Continue work on standing hip strengthening, postural exercises, balance.  Add specific balance exercises to HEP as appropriate.   Lonia Blood, PT 12/30/22 10:13 AM Phone: [phone] Fax: [phone]  [facility] at [facility] [address] [place], [address] [place], [place] [address] Phone # [phone] Fax # [phone]

## 2022-12-30 NOTE — Assessment & Plan Note (Addendum)
Diagnosed in 2005, currently managed with Losartan 100mg  daily and Hydrochlorothiazide 25mg  daily. -Continue current medication regimen.

## 2022-12-30 NOTE — Assessment & Plan Note (Signed)
-  Continue use of Miralax and docusate as needed for constipation related to Parkinson's medications.

## 2022-12-30 NOTE — Assessment & Plan Note (Addendum)
Pain localized to left lower hip, site of previous hip replacement. History of steroid injection for similar pain. Currently undergoing physical therapy and personal training. -Consider use of Robaxin as needed for muscle spasms. -Prescribe Voltaren (diclofenac) gel for topical pain relief. -Consider reestablishing care with [facility] for possible targeted injections.

## 2022-12-30 NOTE — Assessment & Plan Note (Signed)
Progressive symptoms including balance issues and voice changes. Currently managed with Sinemet (25/100 long acting at night and 25/100 short acting three and a half tablets three times a day), Pramipexole 0.5mg  twice a day, and Amantadine 100mg  twice a day. Followed by Dr. Christell Constant at [facility] movement disorder clinic. -Continue current medication regimen. -Follow-up with Dr. Christell Constant next week.

## 2022-12-30 NOTE — Assessment & Plan Note (Signed)
Diagnosed in 1995, currently managed with Synthroid daily. -Continue current medication regimen.

## 2022-12-31 NOTE — Therapy (Signed)
OUTPATIENT PHYSICAL THERAPY NEURO TREATMENT NOTE   Patient Name: Jim Carson MRN: [id number] DOB:[birth year], 72 y.o., male Today's Date: 01/01/2023   PCP: Malka So, MD REFERRING PROVIDER:Moore, Samara Deist, MD  END OF SESSION:  PT End of Session - 01/01/23 1055     Visit Number 7    Number of Visits 12    Date for PT Re-Evaluation 02/04/23    Authorization Type Medicare/AARP    PT Start Time 1018    PT Stop Time 1100    PT Time Calculation (min) 42 min    Equipment Utilized During Treatment Gait belt    Activity Tolerance Patient tolerated treatment well    Behavior During Therapy WFL for tasks assessed/performed               Past Medical History:  Diagnosis Date   Abnormal gait    due to parkinson's,  uses cane   Allergic rhinitis    Allergy [place] 1998   Allergic to dustmits, dog hair and roaches.   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 Lorin Picket  @[facility]   Pneumonia    Sleep apnea    Vitamin B12 deficiency    Vitamin D deficiency    Wears glasses    Past Surgical History:  Procedure Laterality Date   APPENDECTOMY  07/1964   JOINT REPLACEMENT  March 2019 and again on 09/10/2021   Left hip twice.  2nd time due to 3 dislocations   LAPAROSCOPIC CHOLECYSTECTOMY  01-21-2017   @[facility] , [place]   POSTERIOR FUSION CERVICAL SPINE  08-15-2011   @[facility] , [place]   w/ laminectomy and decompression-- C3 -- T1   POSTERIOR LAMINECTOMY / DECOMPRESSION LUMBAR SPINE  07-30-2013   @[facility] ,  [place]   bilateral T11 - 12,  L2 --5   REMOVAL LEFT T1 PARASPINOUS MASS  02-12-2016   @[facility] ,  [place]   desmoid fibromatosis   SHOULDER ARTHROSCOPY WITH ROTATOR CUFF REPAIR Left 12/15/2018   Procedure: SHOULDER ARTHROSCOPY, biceps tenodesis labored Debridement, submacromial decompression;  Surgeon: Eugenia Mcalpine, MD;  Location: [facility];  Service: Orthopedics;  Laterality: Left;  interscalene block   SPINE SURGERY     TOTAL HIP ARTHROPLASTY Left 09/18/2017   @[facility] , [place]   TOTAL HIP REVISION Left 09/10/2021   Procedure: Left hip acetabular versus total hip arthroplasty revision, constrained liner;  Surgeon: Ollen Gross, MD;  Location: [facility];  Service: Orthopedics;  Laterality: Left;   Patient Active Problem List   Diagnosis Date Noted   Drug induced constipation 12/30/2022   Acquired hypothyroidism 12/30/2022   Primary osteoarthritis of left hip 05/18/2021   PD (Parkinson's disease) 05/18/2021   History of gastroesophageal reflux (GERD) 05/18/2021   OSA (obstructive sleep apnea) 05/18/2021   Essential hypertension 05/18/2021    ONSET DATE: 2022  REFERRING DIAG: G20.A1 (ICD-10-CM) - Parkinson's disease   THERAPY DIAG:  Unsteadiness on feet  Other  abnormalities of gait and mobility  Muscle weakness (generalized)  Difficulty in walking, not elsewhere classified  Rationale for Evaluation and Treatment: Rehabilitation  SUBJECTIVE:                                                                                                                                                                                             SUBJECTIVE STATEMENT: "No complaints. My back is stiff but that's about it."   Pt accompanied by: self  PERTINENT HISTORY:   The last fall he had was with a 3 wheeled walker and it was in January. This is the only fall in the past 6 months.   He has R foot drag and he wears an AFO for support noting the foot rolls.He only wears the AFO  when coming to therapy or working out. He reports the foot rolls at other times.   Objective: Timed Up and Go test:15.94 seconds   10 meter walk test: 32.8/12.81 seconds=2.56 ft/sec   5 time sit to stand test:18.60 seconds   Other: Note bilat trendelenberg gait, R foot slap (even with AFO donned), forward leaning trunk posture, tandem gait due to hip weakness.  PAIN:  Are you having pain? Yes: NPRS scale: 0/10 Pain location: L LB Pain description: discomfort Aggravating factors: stretching Relieving factors: standing  PRECAUTIONS: Fall  WEIGHT BEARING RESTRICTIONS: No  FALLS: Has patient fallen in last 6 months? Yes. Number of falls 2  LIVING ENVIRONMENT: Lives with: lives with their family Lives in: House/apartment Stairs: Yes: Internal: 14 stairs to man cave steps; can reach both Has following equipment at home: Dan Humphreys - 4 wheeled  PLOF: Independent with household mobility with device and Independent with community mobility with device  PATIENT GOALS: improve balance  OBJECTIVE:     TODAY'S TREATMENT: 01/01/23 Activity Comments  Standing PWR! Moves performed with voice boost (counting each rep)  PWR! Up 10x  PWR! Rock 10x  PWR! Twist 10x   PWR! Step 10x  In II bars; limited wt shift with PWR! Rock; occasional use of UEs on II bar for balance d/t tendency for posterior LOB.  alt posterior step with arms reached in front 2x10 Unsteadiness and R ankle instability; in II bars; excessive momentum backwards, requiring cues to slow down   wide BOS backwards walking  Poor control with excessive posterior momentum and LEs scissoring initially; improved after practice   sidestepping in II bars with red TB above knees 2x 1 min cueing to maintain neutral hips; R lateral trunk lean   standing on incline EO/EC 3x30" Posterior LOB; worked on using anterior wt shift and hips forward  to recenter balance   Nustep L5 x 6 min UEs/LE Maintaining 75-90SPM                Access  Code: [email or web address] URL: [email or web address] Date: 12/15/2022 Prepared by: [facility] - Outpatient  Rehab - [facility]  Program Notes Seated and standing PWR! Up (added resistance with band), 2 x 10 repsSeated PWR! Step-step out and in, 3# weight, 3 x 10  Exercises - Seated March  - 1 x daily - 5 x weekly - 3 sets - 10 reps (added UE lifts) - Seated Thoracic Flexion and Rotation with Swiss Ball  - 1 x daily - 5 x weekly - 2 sets - 10 reps - 5 sec hold   ------------------------------------------------- Objective measures below taken at initial evaluation:  DIAGNOSTIC FINDINGS: n/a for this episode  COGNITION: Overall cognitive status: Within functional limits for tasks assessed   SENSATION: WFL, right plantar surface numb  COORDINATION: Able to perform rapid alternating movements  EDEMA:  none  MUSCLE TONE: normal, no spasticity or cogwheeling noted in LLE  MUSCLE LENGTH: Full knee extension in sitting    POSTURE: forward head  LOWER EXTREMITY ROM:     Active  Right Eval Left Eval  Hip flexion    Hip extension    Hip abduction    Hip adduction    Hip internal rotation    Hip external rotation    Knee flexion 110 110  Knee extension 0 0  Ankle dorsiflexion 10 10  Ankle plantarflexion    Ankle inversion    Ankle eversion     (Blank rows = not tested)  LOWER EXTREMITY MMT:    Foot drop/drag on Right ankle  BED MOBILITY:  independent  TRANSFERS: Assistive device utilized: Environmental consultant - 4 wheeled  Sit to stand: Complete Independence Stand to sit: Complete Independence Chair to chair: Complete Independence and Modified independence Floor:  NT   CURB:  Level of Assistance: Modified independence Assistive device utilized: Environmental consultant - 4 wheeled Curb Comments:   STAIRS: Level of Assistance: Modified independence Stair Negotiation Technique: Alternating Pattern  with Bilateral Rails Number of Stairs: 12  Height of Stairs: 4-6"  Comments:    GAIT: Gait pattern: trendelenburg, trunk flexed, and wide BOS Distance walked:  Assistive device utilized: Environmental consultant - 4 wheeled Level of assistance: Modified independence and SBA Comments:   FUNCTIONAL TESTS:  5 times sit to stand: 14 sec Mini-BESTest : 16/28 Berg Balance Test: TBD      PATIENT EDUCATION: Education details: assessment details Person educated: Patient Education method: Explanation Education comprehension: verbalized understanding  HOME EXERCISE PROGRAM: TBD  GOALS: Goals reviewed with patient? Yes  SHORT TERM GOALS: Target date: 01/07/2023    Patient will be independent in HEP to improve functional outcomes Baseline: Goal status: IN PROGRESS  2.  Demo improved balance and reduced risk for falls per score 45/56 Berg Balance Test Baseline: 34/56 12/16/2022 Goal status: IN PROGRESS    LONG TERM GOALS: Target date: 02/04/2023    Demo improved balance, postural control, and reduced risk for falls per score 22/28 Mini-BESTest Baseline: 16/28 Goal status: IN PROGRESS  2.  Demo independence in complex HEP including activities for balance, strength, coordination, and ambulation Baseline:  Goal status: IN PROGRESS  3.  Teach-back appropriate community events/classes for those with PD Baseline:  Goal status: IN PROGRESS    ASSESSMENT:  CLINICAL IMPRESSION: Patient arrived to session without new complaints. Standing PWR! Moves were completed today with good effort to  perform large amplitude motions with intention. Parallel bars were used for safety d/t tendency for posterior LOB. Backwards stepping and walking required heavy cueing to promote wider BOS and controlled, safe steps. Patient responded well towards cues given. No complaints at end of session.   OBJECTIVE IMPAIRMENTS: Abnormal gait, decreased activity tolerance, decreased balance, decreased coordination, difficulty walking, and decreased strength.   ACTIVITY LIMITATIONS: carrying, lifting,  bending, transfers, and locomotion level  PARTICIPATION LIMITATIONS: cleaning, laundry, shopping, community activity, and yard work  PERSONAL FACTORS: Age, Time since onset of injury/illness/exacerbation, and 1-2 comorbidities: PD, lumbar sx, hip THR and revision  are also affecting patient's functional outcome.   REHAB POTENTIAL: Good  CLINICAL DECISION MAKING: Evolving/moderate complexity  EVALUATION COMPLEXITY: Moderate  PLAN:  PT FREQUENCY: 1-2x/week, 2x/wk x 4 wks then 1xwk/ x 4 wks  PT DURATION: 8 weeks  PLANNED INTERVENTIONS: Therapeutic exercises, Therapeutic activity, Neuromuscular re-education, Balance training, Gait training, Patient/Family education, Self Care, Joint mobilization, Stair training, Vestibular training, Canalith repositioning, DME instructions, Aquatic Therapy, Dry Needling, Electrical stimulation, Spinal mobilization, and Manual therapy  PLAN FOR NEXT SESSION: Check STGs next week.  Continue work on standing hip strengthening, postural exercises, balance.  Add specific balance exercises to HEP as appropriate.   Anette Guarneri, PT, DPT 01/01/23 11:00 AM  [facility] at [facility] [address] [place], [address] [place], [place] [address] Phone # [phone] Fax # [phone]

## 2023-01-01 ENCOUNTER — Ambulatory Visit: Payer: Medicare Other | Admitting: Physical Therapy

## 2023-01-01 ENCOUNTER — Ambulatory Visit: Payer: Medicare Other

## 2023-01-01 ENCOUNTER — Encounter: Payer: Self-pay | Admitting: Physical Therapy

## 2023-01-01 DIAGNOSIS — R1312 Dysphagia, oropharyngeal phase: Secondary | ICD-10-CM

## 2023-01-01 DIAGNOSIS — R471 Dysarthria and anarthria: Secondary | ICD-10-CM

## 2023-01-01 DIAGNOSIS — R2689 Other abnormalities of gait and mobility: Secondary | ICD-10-CM

## 2023-01-01 DIAGNOSIS — R2681 Unsteadiness on feet: Secondary | ICD-10-CM

## 2023-01-01 DIAGNOSIS — M6281 Muscle weakness (generalized): Secondary | ICD-10-CM

## 2023-01-01 DIAGNOSIS — R262 Difficulty in walking, not elsewhere classified: Secondary | ICD-10-CM

## 2023-01-01 NOTE — Therapy (Signed)
OUTPATIENT SPEECH LANGUAGE PATHOLOGY PARKINSON'S TREATMENT   Patient Name: Jim Carson MRN: [id number] DOB:[birth year], 72 y.o., male Today's Date: 01/01/2023  PCP: Malka So., MD REFERRING PROVIDER: Carrie Mew, MD  END OF SESSION:  End of Session - 01/01/23 0942     Visit Number 4    Number of Visits 17    Date for SLP Re-Evaluation 02/14/23    SLP Start Time 0939    SLP Stop Time  1015    SLP Time Calculation (min) 36 min    Activity Tolerance Patient tolerated treatment well               Past Medical History:  Diagnosis Date   Abnormal gait    due to parkinson's,  uses cane   Allergic rhinitis    Allergy Spring 1998   Allergic to dustmits, dog hair and roaches.   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 Lorin Picket  @[facility]   Pneumonia    Sleep apnea    Vitamin B12 deficiency    Vitamin D deficiency    Wears glasses    Past Surgical History:  Procedure Laterality Date   APPENDECTOMY  07/1964   JOINT REPLACEMENT  March 2019 and again on 09/10/2021   Left hip twice.  2nd time due to 3 dislocations   LAPAROSCOPIC CHOLECYSTECTOMY  01-21-2017   @[facility] , [place]   POSTERIOR FUSION CERVICAL SPINE  08-15-2011   @[facility] , [place]   w/ laminectomy and decompression-- C3 -- T1   POSTERIOR LAMINECTOMY / DECOMPRESSION LUMBAR SPINE  07-30-2013   @[facility] , [place]   bilateral T11 - 12,  L2 --5   REMOVAL LEFT T1 PARASPINOUS MASS  02-12-2016   @[facility] ,  [place]   desmoid fibromatosis    SHOULDER ARTHROSCOPY WITH ROTATOR CUFF REPAIR Left 12/15/2018   Procedure: SHOULDER ARTHROSCOPY, biceps tenodesis labored Debridement, submacromial decompression;  Surgeon: Eugenia Mcalpine, MD;  Location: [facility];  Service: Orthopedics;  Laterality: Left;  interscalene block   SPINE SURGERY     TOTAL HIP ARTHROPLASTY Left 09/18/2017   @[facility] , [place]   TOTAL HIP REVISION Left 09/10/2021   Procedure: Left hip acetabular versus total hip arthroplasty revision, constrained liner;  Surgeon: Ollen Gross, MD;  Location: [facility];  Service: Orthopedics;  Laterality: Left;   Patient Active Problem List   Diagnosis Date Noted   Drug induced constipation 12/30/2022   Acquired hypothyroidism 12/30/2022   Primary osteoarthritis of left hip 05/18/2021   PD (Parkinson's disease) 05/18/2021   History of gastroesophageal reflux (GERD) 05/18/2021   OSA (obstructive sleep apnea) 05/18/2021   Essential hypertension 05/18/2021    ONSET DATE: 2013 (script dated 11/22/22)  REFERRING DIAG:  G20.A1 (ICD-10-CM) - Parkinson's disease    THERAPY DIAG:  Dysarthria and anarthria  Dysphagia, oropharyngeal phase  Rationale for Evaluation and Treatment: Rehabilitation  SUBJECTIVE:   SUBJECTIVE STATEMENT:  Pt has not been  consistent with Speak Out exercises, endorses daily production of swallowing HEP of 30 reps each.  Pt accompanied by: self  PERTINENT HISTORY: Patient has past medical history significant for Parkinson's disease diagnosed in 2013 symptomatic starting 2012, gait disturbance, dysphagia, allergic rhinitis, GERD, OSA on CPAP. Prior surgical history includes cervical spine fusion posterior 2013 and patient denies dysphagia following, left hip revision 2020, posterior laminectomy/decompression of lumbar spine, removal left T1 paraspinous mass. Pt had ST course in 2021 which was successful in improving speech loudness, and participation in swallowing exercises.  PAIN:  Are you  having pain? No  FALLS: Has patient fallen in last 6 months?  See PT evaluation for details  PATIENT GOALS: Improve loudness and swallowing  OBJECTIVE:   DIAGNOSTIC FINDINGS:  FINDINGS FROM MBS 12/11/22: (MODIFIED WITH BOLD PRINT) Clinical Impression: Patient swallow function is consistent with prior modified barium swallow study.  Mild oropharyngeal phase dysphagia continues due to patient's Parkinson's.  Minimal mastication of solids noted despite saying he felt like he chewed well.  Pharyngeal timing of swallow actually appeared improved with swallowing trigger at tongue base, vallecula with pures and piriform sinus with liquids.  Retention mostly vallecular and on the superior surface of epiglottis present due to diminished tongue base retraction and wall motility.  Patient senses retention at the piriform sinus when it is indeed located at the vallecular space. Liquid swallows are most effective to diminish amount of pudding and cracker bolus retained.  Various postures including chin, head turn right, head turn left did not appear to significantly improved pharyngeal clearance.  If any posture was helpful perhaps head turn left may have been slightly helpful.  Cueing patient to expectorate (cough) was noted to clear vallecular retention that he did want clear with dry swallows.     Motility adequacy of musculature varied during the study with epiglottic deflection better with liquids than with solids.  Patient was able to easily swallow barium tablet with liquid without deficits, with no aspiration or penetration..  Of note he did not cough or clear his throat during entire study while swallowing barium however prior to barium administration patient did clear his throat immediately post and water swallow.     Highly recommend patient follow-up with speech pathologist to establish exercise regimen to improve pharyngeal motility.  Recommend he masticates well, follow solids with liquids, maintain  strength of cough, voice, and expectoration for airway protection.  Also provided him information regarding a vacuum device for clearance of aspiration for emergent use.  Thank you for this consult.   PATIENT REPORTED OUTCOME MEASURES (PROM): Question Patient's Response  My swallowing problem has caused me to lose weight 0  2.  My swallowing problem interferes with my ability to go out to meals 0  3.  Swallowing liquids takes extra effort 0  4.  Swallowing solids takes extra effort 2  5.  Swallowing pills takes extra effort 0  6.  Swallowing is painful 0  7.  The pleasure of eating is affected by my swallowing 1  8.  When I swallow food sticks in my throat 2  9.  I cough when I eat 3  10.  Swallowing is stressful  2    Communication Effectiveness Survey        How effective is your speech in... Pt Rating  Having a conversation with family/friends 2  2.   Conversing with strangers 2  3.   Talking to someone familiar on the phone 2.5  4.  Talking to someone unfamiliar on the phone 2.5  5.   Being part of conversation in a noisy environment  2.5  6.   Speaking to someone when angry or upset 3  7.   Conversing while travelling in the car 2.5  8.   Conversing across distance  3   TODAY'S TREATMENT:                                                                                                                                         DATE:   SO=Speak Out  01/01/23: SWALLOW: pt demonstrates 5/5 swallow exercises with independence, reports to daily implementation. Jim Carson continues to use tally marks to ensure completion of all exercises.  SPEECH: SLP inquired about pt's consistency with SO, pt acknowledged he has not been consistent. SLP educated pt that if he is finding it difficult to complete daily that he needs to take a break and return to ST when he can complete daily. SLP reiterated daily practice lesson x2, or once with daily lesson and once with online practice. Showed pt webpage with  online practice for him to navigate to on his own if desired. Pt told SLP he would complete today and subsequent days x2 as directed.  SLP targeted improving vocal quality and increasing intensity through progressively difficulty speech tasks using Speak Out! program, lesson 2. ST lead pt through exercises providing occasional model with min-mod-A required to achieve target dB this date. Averages this date:  M-words: 85dB Sustained "ah" 88 dB Reading (phrases) 82 dB Cognitive speech task 78 dB Conversational task: Conversational sample of approx 3 minutes, pt averaged 71 dB with occasional min A.  Benefiting from occasional min -mod A for improved breath support during warm up tasks, though some tasks were modified to accommodate decreased breath support. Goal is to complete warm up exercises of speak out daily before next session.   12/23/22: SWALLOW: pt demonstrates 5/5 swallow exercises with mod-I, reports to daily implementation. Using tally marks to ensure completion of all exercises.  SPEECH: SLP re-introduced pt to speak out, explained intentional speech and it's role  Target improving vocal quality and increasing intensity through progressively difficulty speech tasks using Speak Out! program, lesson 1. ST leads pt through exercises providing usual model prior to pt execution. usual mod-A required to achieve target dB this date. Averages this date:  Sustained "ah" 91 dB Reading (phrases) 80 dB Cognitive speech task 78 dB Conversational task:  Benefiting from tactile cues for improved breath support during warm up tasks, though  some tasks were modified to accommodate cognitive load and decreased breath support. Conversational sample of approx 5 minutes, pt averages 71 dB with occasional min-A. Goal is to complete warm up exercises of speak out daily between this and next session.    12/18/22: SWALLOW: reviewed pt's dysphagia HEP. SLP had to re-educate pt about procedure for [facility]. SLP  taught pt the alternate to [place] (CTAR - "chin pushback" on pt's sheet) in previous session due to pt's cervical/back issues.  SPEECH: SLP ordered pt Speak Out book today. Introduced pt to [employer] everyday tasks May-me-my-moe-moo, ah, and glide. Pt with difficulty with breath support due to other dx's. Cue to "Be intentional with your air" was helpful but pt had to rush May-Moo, and with glide req'd cues for starting hi -lo glide at roughly the same Hz he ended on with lo-hi glide. Pt was independent by session end. Told pt to complete these tasks BID in prep for start of Speak Out.  12/16/22 (eval): SLP reviewed results from [facility] and provided precautions and HEP.   PATIENT EDUCATION: Education details: See "pt instructions" and "today's treatment". Person educated: Patient Education method: Explanation, Demonstration, Verbal cues, and Handouts Education comprehension: verbalized understanding, returned demonstration, verbal cues required, and needs further education   GOALS: Goals reviewed with patient? Yes  SHORT TERM GOALS: Target date: 01/17/23  Pt will complete dysphagia HEP with modified independence x2 sessions Baseline: 01/01/23 Goal status: INITIAL  2.  Pt will perform thorough mastication and alternate bite/sip precautions with POs during session with modified independence in 2 sessions Baseline:  Goal status: INITIAL  3.  Pt will complete vocal warm up tasks (ah, glides, counting, etc) with rare min A in 2 sessions Baseline:  Goal status: INITIAL  4.  Jim Carson will improve speech volume in 5 minutes simple conversation to low 70s dB in 3 sessions Baseline:  Goal status: INITIAL  5.  Pt will demonstrate completion of home exercises as prescribed 90% compliance in first 2 weeks Baseline:  Goal status: INITIAL   LONG TERM GOALS: Target date: 02/14/23  Pt will complete dysphagia HEP with independence x3 sessions Baseline:  Goal status: INITIAL  2.  Pt will perform  thorough mastication and alternate bite/sip precautions with POs during session with independence in 3 sessions Baseline:  Goal status: INITIAL  3.  Pt will complete vocal warm up tasks (ah, glides, counting, etc) with modified independence in 3 sessions Baseline:  Goal status: INITIAL  4.  Jim Carson will improve speech volume in 10 minutes simple-mod complex conversation to low 70s dB in 3 sessions Baseline:  Goal status: INITIAL  5.  In last 1-2 sessions, both of Bobby's PROM scores will improve compared to initial administration Baseline:  Goal status: INITIAL  6.  Jim Carson will tell SLP what HEP to perform in order to maintain WNL volume speech over time Baseline:  Goal status: INITIAL  ASSESSMENT:  CLINICAL IMPRESSION: Patient is a 72 y.o. M who was seen today for treatment of voice (reduced voice volume), and review MBS study and initiate HEP for dysphagia. When he heard a recording of his own voice in unstructured question and answer using intent and effort, "Wow I never thought it could sound like that, I need to make that a habit." Pt completed dysphagia HEP with occasional min A faded to independent. He told SLP his list of precautions at two different times in the session today, and will be provided with this list next session.   OBJECTIVE IMPAIRMENTS: Objective impairments include dysarthria and dysphagia. These impairments are limiting patient from household responsibilities, ADLs/IADLs, effectively communicating at home and in community, and safety when swallowing.Factors affecting potential to achieve goals and functional outcome are  none noted .Marland Kitchen Patient will benefit from skilled SLP services to address above impairments and improve overall function.  REHAB POTENTIAL: Good  PLAN:  SLP FREQUENCY: 2x/week  SLP DURATION: 8 weeks  PLANNED INTERVENTIONS: Aspiration precaution training, Pharyngeal strengthening exercises, Diet toleration management , Environmental controls,  Internal/external aids, Oral motor exercises, Functional tasks, SLP instruction and feedback, Compensatory strategies, and Patient/family education    [facility], CCC-SLP 01/01/2023, 9:44 AM

## 2023-01-06 ENCOUNTER — Encounter: Payer: Self-pay | Admitting: Physical Therapy

## 2023-01-06 ENCOUNTER — Ambulatory Visit: Payer: Medicare Other | Admitting: Physical Therapy

## 2023-01-06 ENCOUNTER — Ambulatory Visit: Payer: Medicare Other

## 2023-01-06 DIAGNOSIS — R1312 Dysphagia, oropharyngeal phase: Secondary | ICD-10-CM

## 2023-01-06 DIAGNOSIS — R2681 Unsteadiness on feet: Secondary | ICD-10-CM

## 2023-01-06 DIAGNOSIS — R471 Dysarthria and anarthria: Secondary | ICD-10-CM

## 2023-01-06 DIAGNOSIS — M6281 Muscle weakness (generalized): Secondary | ICD-10-CM

## 2023-01-06 DIAGNOSIS — R2689 Other abnormalities of gait and mobility: Secondary | ICD-10-CM

## 2023-01-06 NOTE — Therapy (Signed)
OUTPATIENT PHYSICAL THERAPY NEURO TREATMENT NOTE   Patient Name: Jim Carson MRN: [id number] DOB:1951/05/29, 72 y.o., male Today's Date: 01/06/2023   PCP: Malka So, MD REFERRING PROVIDER:Moore, Samara Deist, MD  END OF SESSION:  PT End of Session - 01/06/23 0936     Visit Number 8    Number of Visits 12    Date for PT Re-Evaluation 02/04/23    Authorization Type Medicare/AARP    PT Start Time 0934    PT Stop Time 1014    PT Time Calculation (min) 40 min    Equipment Utilized During Treatment Gait belt    Activity Tolerance Patient tolerated treatment well    Behavior During Therapy [facility] for tasks assessed/performed               Past Medical History:  Diagnosis Date   Abnormal gait    due to parkinson's,  uses cane   Allergic rhinitis    Allergy [place] 1998   Allergic to dustmits, dog hair and roaches.   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 Lorin Picket  @[facility]   Pneumonia    Sleep apnea    Vitamin B12 deficiency    Vitamin D deficiency    Wears glasses    Past Surgical History:  Procedure Laterality Date   APPENDECTOMY  07/1964   JOINT REPLACEMENT  March 2019 and again on 09/10/2021   Left hip twice.  2nd time due to 3 dislocations   LAPAROSCOPIC CHOLECYSTECTOMY  01-21-2017   @[facility] , [place]   POSTERIOR FUSION CERVICAL SPINE  08-15-2011   @[facility] , [place]   w/ laminectomy and decompression-- C3 -- T1   POSTERIOR LAMINECTOMY / DECOMPRESSION LUMBAR SPINE  07-30-2013   @[facility] ,  [place]   bilateral T11 - 12,  L2 --5   REMOVAL LEFT T1 PARASPINOUS MASS  02-12-2016   @[facility] ,  [place]   desmoid fibromatosis   SHOULDER ARTHROSCOPY WITH ROTATOR CUFF REPAIR Left 12/15/2018   Procedure: SHOULDER ARTHROSCOPY, biceps tenodesis labored Debridement, submacromial decompression;  Surgeon: Eugenia Mcalpine, MD;  Location: [facility];  Service: Orthopedics;  Laterality: Left;  interscalene block   SPINE SURGERY     TOTAL HIP ARTHROPLASTY Left 09/18/2017   @[facility] , [place]   TOTAL HIP REVISION Left 09/10/2021   Procedure: Left hip acetabular versus total hip arthroplasty revision, constrained liner;  Surgeon: Ollen Gross, MD;  Location: [facility];  Service: Orthopedics;  Laterality: Left;   Patient Active Problem List   Diagnosis Date Noted   Drug induced constipation 12/30/2022   Acquired hypothyroidism 12/30/2022   Primary osteoarthritis of left hip 05/18/2021   PD (Parkinson's disease) 05/18/2021   History of gastroesophageal reflux (GERD) 05/18/2021   OSA (obstructive sleep apnea) 05/18/2021   Essential hypertension 05/18/2021    ONSET DATE: 2022  REFERRING DIAG: G20.A1 (ICD-10-CM) - Parkinson's disease   THERAPY DIAG:  Unsteadiness on feet  Other  abnormalities of gait and mobility  Muscle weakness (generalized)  Rationale for Evaluation and Treatment: Rehabilitation  SUBJECTIVE:                                                                                                                                                                                             SUBJECTIVE STATEMENT: No falls, doing well.  Pt accompanied by: self  PERTINENT HISTORY:   The last fall he had was with a 3 wheeled walker and it was in January. This is the only fall in the past 6 months.   He has R foot drag and he wears an AFO for support noting the foot rolls.He only wears the AFO when coming to therapy or working out. He reports the foot rolls at other times.    Objective: Timed Up and Go test:15.94 seconds   10 meter walk test: 32.8/12.81 seconds=2.56 ft/sec   5 time sit to stand test:18.60 seconds   Other: Note bilat trendelenberg gait, R foot slap (even with AFO donned), forward leaning trunk posture, tandem gait due to hip weakness.  PAIN:  Are you having pain? Yes: NPRS scale: 0/10 Pain location: L LB Pain description: discomfort Aggravating factors: stretching Relieving factors: standing  PRECAUTIONS: Fall  WEIGHT BEARING RESTRICTIONS: No  FALLS: Has patient fallen in last 6 months? Yes. Number of falls 2  LIVING ENVIRONMENT: Lives with: lives with their family Lives in: House/apartment Stairs: Yes: Internal: 14 stairs to man cave steps; can reach both Has following equipment at home: Walker - 4 wheeled  PLOF: Independent with household mobility with device and Independent with community mobility with device  PATIENT GOALS: improve balance  OBJECTIVE:   Reports his HEP is sit<>stand, SKTC, open book stretch Seated PWR! Moves, seated march/UE lifts, and seated back stretch  TODAY'S TREATMENT: 01/06/2023 Activity Comments  Seated PWR! Up x 10 Seated PWR! Rock x 10 Seated PWR! Step x 10   Berg Balance test: 41/56 Improved from 34/56  Standing PWR! Moves performed with voice boost (counting each rep)  PWR! Up 10x  PWR! Rock 10x  PWR! Twist 10x   PWR! Step 10x    Stagger stance forward/back rocking, 10 reps   Back step and weightshift x 10 reps, then alternating legs x 10 Intermittent UE support  Forward/back step and weightshift Intermittent UE support, more difficult with RLE as stance       Access Code: [email or web address] URL: [email or web address] Date: 12/15/2022 Prepared by: [facility] - Outpatient  Rehab - [facility]  Program Notes Seated and standing PWR! Up (added resistance with band), 2 x 10 repsSeated PWR! Step-step  out and in, 3# weight, 3 x 10  Exercises - Seated March  - 1 x daily  - 5 x weekly - 3 sets - 10 reps (added UE lifts) - Seated Thoracic Flexion and Rotation with Swiss Ball  - 1 x daily - 5 x weekly - 2 sets - 10 reps - 5 sec hold   ------------------------------------------------- Objective measures below taken at initial evaluation:  DIAGNOSTIC FINDINGS: n/a for this episode  COGNITION: Overall cognitive status: Within functional limits for tasks assessed   SENSATION: WFL, right plantar surface numb  COORDINATION: Able to perform rapid alternating movements  EDEMA:  none  MUSCLE TONE: normal, no spasticity or cogwheeling noted in LLE  MUSCLE LENGTH: Full knee extension in sitting    POSTURE: forward head  LOWER EXTREMITY ROM:     Active  Right Eval Left Eval  Hip flexion    Hip extension    Hip abduction    Hip adduction    Hip internal rotation    Hip external rotation    Knee flexion 110 110  Knee extension 0 0  Ankle dorsiflexion 10 10  Ankle plantarflexion    Ankle inversion    Ankle eversion     (Blank rows = not tested)  LOWER EXTREMITY MMT:    Foot drop/drag on Right ankle  BED MOBILITY:  independent  TRANSFERS: Assistive device utilized: Environmental consultant - 4 wheeled  Sit to stand: Complete Independence Stand to sit: Complete Independence Chair to chair: Complete Independence and Modified independence Floor:  NT   CURB:  Level of Assistance: Modified independence Assistive device utilized: Environmental consultant - 4 wheeled Curb Comments:   STAIRS: Level of Assistance: Modified independence Stair Negotiation Technique: Alternating Pattern  with Bilateral Rails Number of Stairs: 12  Height of Stairs: 4-6"  Comments:   GAIT: Gait pattern: trendelenburg, trunk flexed, and wide BOS Distance walked:  Assistive device utilized: Environmental consultant - 4 wheeled Level of assistance: Modified independence and SBA Comments:   FUNCTIONAL TESTS:  5 times sit to stand: 14 sec Mini-BESTest : 16/28 Berg Balance Test: TBD      PATIENT  EDUCATION: Education details: assessment details Person educated: Patient Education method: Explanation Education comprehension: verbalized understanding  HOME EXERCISE PROGRAM: TBD  GOALS: Goals reviewed with patient? Yes  SHORT TERM GOALS: Target date: 01/07/2023    Patient will be independent in HEP to improve functional outcomes Baseline: Goal status: MET, 01/06/2023  2.  Demo improved balance and reduced risk for falls per score 45/56 Berg Balance Test Baseline: 34/56 12/16/2022> 41/56 01/06/2023 Goal status: PARTIALLY MET, 01/06/2023    LONG TERM GOALS: Target date: 02/04/2023    Demo improved balance, postural control, and reduced risk for falls per score 22/28 Mini-BESTest Baseline: 16/28 Goal status: IN PROGRESS  2.  Demo independence in complex HEP including activities for balance, strength, coordination, and ambulation Baseline:  Goal status: IN PROGRESS  3.  Teach-back appropriate community events/classes for those with PD Baseline:  Goal status: IN PROGRESS    ASSESSMENT:  CLINICAL IMPRESSION: Assessed STGS this visit, with pt meeting STG 1 and partially meeting STG 2.  Pt has improved Berg score from 34/56 to 41/56, with improved score he is still at fall risk.  Worked on seated and standing PWR! Moves, with pt able to perform PWR! Moves with UE support and occasional work to recover posterior LOB.  He has some emerging hip strategy>step strategy to help prevent posterior LOB, but it is delayed at times, needing  UE support to help regain balance.  He will continue to benefit from skilled PT towards goals for improved functional mobility and decreased fall risk.  OBJECTIVE IMPAIRMENTS: Abnormal gait, decreased activity tolerance, decreased balance, decreased coordination, difficulty walking, and decreased strength.   ACTIVITY LIMITATIONS: carrying, lifting, bending, transfers, and locomotion level  PARTICIPATION LIMITATIONS: cleaning, laundry, shopping, community  activity, and yard work  PERSONAL FACTORS: Age, Time since onset of injury/illness/exacerbation, and 1-2 comorbidities: PD, lumbar sx, hip THR and revision  are also affecting patient's functional outcome.   REHAB POTENTIAL: Good  CLINICAL DECISION MAKING: Evolving/moderate complexity  EVALUATION COMPLEXITY: Moderate  PLAN:  PT FREQUENCY: 1-2x/week, 2x/wk x 4 wks then 1xwk/ x 4 wks  PT DURATION: 8 weeks  PLANNED INTERVENTIONS: Therapeutic exercises, Therapeutic activity, Neuromuscular re-education, Balance training, Gait training, Patient/Family education, Self Care, Joint mobilization, Stair training, Vestibular training, Canalith repositioning, DME instructions, Aquatic Therapy, Dry Needling, Electrical stimulation, Spinal mobilization, and Manual therapy  PLAN FOR NEXT SESSION: Continue work on standing hip strengthening, postural exercises, balance.  Add specific balance exercises to HEP as appropriate.   Lonia Blood, PT 01/06/23 10:16 AM Phone: [phone] Fax: [phone]  [facility] at [facility] [address] [place], [address] [place], [place] [address] Phone # [phone] Fax # [phone]

## 2023-01-06 NOTE — Therapy (Signed)
OUTPATIENT SPEECH LANGUAGE PATHOLOGY PARKINSON'S TREATMENT   Patient Name: Jim Carson MRN: [id number] DOB:1951/03/14, 72 y.o., male Today's Date: 01/06/2023  PCP: Malka So., MD REFERRING PROVIDER: Carrie Mew, MD  END OF SESSION:  End of Session - 01/06/23 0857     Visit Number 5    Number of Visits 17    Date for SLP Re-Evaluation 02/14/23    SLP Start Time 0851    SLP Stop Time  0930    SLP Time Calculation (min) 39 min    Activity Tolerance Patient tolerated treatment well               Past Medical History:  Diagnosis Date   Abnormal gait    due to parkinson's,  uses cane   Allergic rhinitis    Allergy Spring 1998   Allergic to dustmits, dog hair and roaches.   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 Lorin Picket  @[facility]   Pneumonia    Sleep apnea    Vitamin B12 deficiency    Vitamin D deficiency    Wears glasses    Past Surgical History:  Procedure Laterality Date   APPENDECTOMY  07/1964   JOINT REPLACEMENT  March 2019 and again on 09/10/2021   Left hip twice.  2nd time due to 3 dislocations   LAPAROSCOPIC CHOLECYSTECTOMY  01-21-2017   @[facility] , [place]   POSTERIOR FUSION CERVICAL SPINE  08-15-2011   @[facility] , [place]   w/ laminectomy and decompression-- C3 -- T1   POSTERIOR LAMINECTOMY / DECOMPRESSION LUMBAR SPINE  07-30-2013   @[facility] , [place]   bilateral T11 - 12,  L2 --5   REMOVAL LEFT T1 PARASPINOUS MASS  02-12-2016   @[facility] ,  [place]   desmoid fibromatosis    SHOULDER ARTHROSCOPY WITH ROTATOR CUFF REPAIR Left 12/15/2018   Procedure: SHOULDER ARTHROSCOPY, biceps tenodesis labored Debridement, submacromial decompression;  Surgeon: Eugenia Mcalpine, MD;  Location: [facility];  Service: Orthopedics;  Laterality: Left;  interscalene block   SPINE SURGERY     TOTAL HIP ARTHROPLASTY Left 09/18/2017   @[facility] , [place]   TOTAL HIP REVISION Left 09/10/2021   Procedure: Left hip acetabular versus total hip arthroplasty revision, constrained liner;  Surgeon: Ollen Gross, MD;  Location: [facility];  Service: Orthopedics;  Laterality: Left;   Patient Active Problem List   Diagnosis Date Noted   Drug induced constipation 12/30/2022   Acquired hypothyroidism 12/30/2022   Primary osteoarthritis of left hip 05/18/2021   PD (Parkinson's disease) 05/18/2021   History of gastroesophageal reflux (GERD) 05/18/2021   OSA (obstructive sleep apnea) 05/18/2021   Essential hypertension 05/18/2021    ONSET DATE: 2013 (script dated 11/22/22)  REFERRING DIAG:  G20.A1 (ICD-10-CM) - Parkinson's disease    THERAPY DIAG:  Dysarthria and anarthria  Dysphagia, oropharyngeal phase  Rationale for Evaluation and Treatment: Rehabilitation  SUBJECTIVE:   SUBJECTIVE STATEMENT:  Pt endorses daily production  of swallowing HEP of 30 reps each. Has been performing SO warm up, ah, glide, numbers each day but no lessons, despite SLP telling pt to do this in previous session. Pt accompanied by: self  PERTINENT HISTORY: Patient has past medical history significant for Parkinson's disease diagnosed in 2013 symptomatic starting 2012, gait disturbance, dysphagia, allergic rhinitis, GERD, OSA on CPAP. Prior surgical history includes cervical spine fusion posterior 2013 and patient denies dysphagia following, left hip revision 2020, posterior laminectomy/decompression of lumbar spine, removal left T1 paraspinous mass. Pt had ST course in 2021 which was successful in improving  speech loudness, and participation in swallowing exercises.  PAIN:  Are you having pain? No  FALLS: Has patient fallen in last 6 months?  See PT evaluation for details  PATIENT GOALS: Improve loudness and swallowing  OBJECTIVE:   DIAGNOSTIC FINDINGS:  FINDINGS FROM MBS 12/11/22: (MODIFIED WITH BOLD PRINT) Clinical Impression: Patient swallow function is consistent with prior modified barium swallow study.  Mild oropharyngeal phase dysphagia continues due to patient's Parkinson's.  Minimal mastication of solids noted despite saying he felt like he chewed well.  Pharyngeal timing of swallow actually appeared improved with swallowing trigger at tongue base, vallecula with pures and piriform sinus with liquids.  Retention mostly vallecular and on the superior surface of epiglottis present due to diminished tongue base retraction and wall motility.  Patient senses retention at the piriform sinus when it is indeed located at the vallecular space. Liquid swallows are most effective to diminish amount of pudding and cracker bolus retained.  Various postures including chin, head turn right, head turn left did not appear to significantly improved pharyngeal clearance.  If any posture was helpful perhaps head turn left may have been slightly helpful.  Cueing patient to expectorate (cough) was noted to clear vallecular retention that he did want clear with dry swallows.     Motility adequacy of musculature varied during the study with epiglottic deflection better with liquids than with solids.  Patient was able to easily swallow barium tablet with liquid without deficits, with no aspiration or penetration..  Of note he did not cough or clear his throat during entire study while swallowing barium however prior to barium administration patient did clear his throat immediately post and water swallow.     Highly recommend patient follow-up with speech pathologist to establish exercise regimen to improve pharyngeal  motility.  Recommend he masticates well, follow solids with liquids, maintain strength of cough, voice, and expectoration for airway protection.  Also provided him information regarding a vacuum device for clearance of aspiration for emergent use.  Thank you for this consult.   PATIENT REPORTED OUTCOME MEASURES (PROM): Question Patient's Response  My swallowing problem has caused me to lose weight 0  2.  My swallowing problem interferes with my ability to go out to meals 0  3.  Swallowing liquids takes extra effort 0  4.  Swallowing solids takes extra effort 2  5.  Swallowing pills takes extra effort 0  6.  Swallowing is painful 0  7.  The pleasure of eating is affected by my swallowing 1  8.  When I swallow food sticks in my throat 2  9.  I cough when I eat 3  10.  Swallowing is stressful  2    Communication Effectiveness Survey        How effective is your speech in... Pt Rating  Having a conversation with family/friends 2  2.   Conversing with strangers 2  3.   Talking to someone familiar on the phone 2.5  4.   Talking to someone unfamiliar on the phone 2.5  5.   Being part of conversation in a noisy environment  2.5  6.   Speaking to someone when angry or upset 3  7.   Conversing while travelling in the car 2.5  8.   Conversing across distance  3   TODAY'S TREATMENT:                                                                                                                                         DATE:   SO=Speak Out  01/06/23: SPEECH: Given "s", SLP reiterated daily practice of SO lesson x2, or once with daily lesson and once with online practice. SLP repeated this x3 during today's session. Pt told SLP he would complete online session today, and cont with subsequent lesson + online practice. SLP targeted improving vocal quality and increasing intensity through progressively difficulty speech tasks using Speak Out! program, lesson 3. ST lead pt through exercises providing  occasional model with min-mod-A required to achieve target dB this date. Averages this date:  M-words: 88dB, with initial cue to slow production and decr volume Sustained "ah" 87 dB initial cue to decr volume Glides - usual max faded to occasional mod-max cues for hold pitch at bottom and top, average 83 dB Numbers - mod-max initial cues for "swinging" numbers instead of separate production- average 85 dB Reading (phrases) 82 dB Conversation task - average 72 dB Speech between tasks was sub-WNL. This improved after the reading and conversation tasks.   01/01/23: SWALLOW: pt demonstrates 5/5 swallow exercises with independence, reports to daily implementation. Reita Cliche continues to use tally marks to ensure completion of all exercises.  SPEECH: SLP inquired about pt's consistency with SO, pt acknowledged he has not been consistent. SLP educated pt that if he is finding it difficult to complete daily that he needs to take a break and return to ST when he can complete daily. SLP reiterated daily practice lesson x2, or once with daily lesson and once with online practice. Showed pt webpage with online practice for him to navigate to on his own if desired. Pt told SLP he would complete today and subsequent days x2 as directed.  SLP targeted improving vocal quality and increasing intensity through progressively difficulty speech tasks using Speak Out! program, lesson 2. ST lead pt through exercises providing occasional model with min-mod-A required to achieve target dB this date. Averages this date:  M-words: 85dB Sustained "ah" 88 dB Reading (phrases) 82 dB Cognitive speech task 78 dB Conversational task: Conversational sample of approx 3 minutes, pt averaged 71 dB with occasional min A.  Benefiting from occasional min -mod A for improved breath support during warm up tasks, though some tasks were modified to accommodate decreased breath support. Goal is to complete warm up exercises  of speak out daily before  next session.   12/23/22: SWALLOW: pt demonstrates 5/5 swallow exercises with mod-I, reports to daily implementation. Using tally marks to ensure completion of all exercises.  SPEECH: SLP re-introduced pt to speak out, explained intentional speech and it's role  Target improving vocal quality and increasing intensity through progressively difficulty speech tasks using Speak Out! program, lesson 1. ST leads pt through exercises providing usual model prior to pt execution. usual mod-A required to achieve target dB this date. Averages this date:  Sustained "ah" 91 dB Reading (phrases) 80 dB Cognitive speech task 78 dB Conversational task:  Benefiting from tactile cues for improved breath support during warm up tasks, though  some tasks were modified to accommodate cognitive load and decreased breath support. Conversational sample of approx 5 minutes, pt averages 71 dB with occasional min-A. Goal is to complete warm up exercises of speak out daily between this and next session.    12/18/22: SWALLOW: reviewed pt's dysphagia HEP. SLP had to re-educate pt about procedure for [facility]. SLP taught pt the alternate to [place] (CTAR - "chin pushback" on pt's sheet) in previous session due to pt's cervical/back issues.  SPEECH: SLP ordered pt Speak Out book today. Introduced pt to [employer] everyday tasks May-me-my-moe-moo, ah, and glide. Pt with difficulty with breath support due to other dx's. Cue to "Be intentional with your air" was helpful but pt had to rush May-Moo, and with glide req'd cues for starting hi -lo glide at roughly the same Hz he ended on with lo-hi glide. Pt was independent by session end. Told pt to complete these tasks BID in prep for start of Speak Out.  12/16/22 (eval): SLP reviewed results from [facility] and provided precautions and HEP.   PATIENT EDUCATION: Education details: See "pt instructions" and "today's treatment". Person educated: Patient Education method: one or more of:  Explanation, Demonstration, Verbal cues, and Handouts Education comprehension: One or more of : verbalized understanding, returned demonstration, verbal cues required, and needs further education   GOALS: Goals reviewed with patient? Yes  SHORT TERM GOALS: Target date: 01/17/23  Pt will complete dysphagia HEP with modified independence x2 sessions Baseline: 01/01/23 Goal status: INITIAL  2.  Pt will perform thorough mastication and alternate bite/sip precautions with POs during session with modified independence in 2 sessions Baseline:  Goal status: INITIAL  3.  Pt will complete vocal warm up tasks (ah, glides, counting, etc) with rare min A in 2 sessions Baseline:  Goal status: INITIAL  4.  Reita Cliche will improve speech volume in 5 minutes simple conversation to low 70s dB in 3 sessions Baseline:  Goal status: INITIAL  5.  Pt will demonstrate completion of home exercises as prescribed 90% compliance in first 2 weeks Baseline:  Goal status: INITIAL   LONG TERM GOALS: Target date: 02/14/23  Pt will complete dysphagia HEP with independence x3 sessions Baseline:  Goal status: INITIAL  2.  Pt will perform thorough mastication and alternate bite/sip precautions with POs during session with independence in 3 sessions Baseline:  Goal status: INITIAL  3.  Pt will complete vocal warm up tasks (ah, glides, counting, etc) with modified independence in 3 sessions Baseline:  Goal status: INITIAL  4.  Reita Cliche will improve speech volume in 10 minutes simple-mod complex conversation to low 70s dB in 3 sessions Baseline:  Goal status: INITIAL  5.  In last 1-2 sessions, both of Bobby's PROM scores will improve compared to initial administration Baseline:  Goal status:  INITIAL  6.  Reita Cliche will tell SLP what HEP to perform in order to maintain WNL volume speech over time Baseline:  Goal status: INITIAL  ASSESSMENT:  CLINICAL IMPRESSION: Patient is a 72 y.o. M who was seen today for  treatment of voice (reduced voice volume), and review MBS study and initiate HEP for dysphagia. See "today's treatment" for details of today's session.   OBJECTIVE IMPAIRMENTS: Objective impairments include dysarthria and dysphagia. These impairments are limiting patient from household responsibilities, ADLs/IADLs, effectively communicating at home and in community, and safety when swallowing.Factors affecting potential to achieve goals and functional outcome are  none noted .Marland Kitchen Patient will benefit from skilled SLP services to address above impairments and improve overall function.  REHAB POTENTIAL: Good  PLAN:  SLP FREQUENCY: 2x/week  SLP DURATION: 8 weeks  PLANNED INTERVENTIONS: Aspiration precaution training, Pharyngeal strengthening exercises, Diet toleration management , Environmental controls, Internal/external aids, Oral motor exercises, Functional tasks, SLP instruction and feedback, Compensatory strategies, and Patient/family education    [facility], CCC-SLP 01/06/2023, 8:58 AM

## 2023-01-08 ENCOUNTER — Encounter: Payer: Medicare Other | Admitting: Speech Pathology

## 2023-01-08 ENCOUNTER — Ambulatory Visit: Payer: Medicare Other | Admitting: Physical Therapy

## 2023-01-08 ENCOUNTER — Encounter: Payer: Self-pay | Admitting: Physical Therapy

## 2023-01-08 DIAGNOSIS — R2681 Unsteadiness on feet: Secondary | ICD-10-CM | POA: Diagnosis not present

## 2023-01-08 DIAGNOSIS — M6281 Muscle weakness (generalized): Secondary | ICD-10-CM

## 2023-01-08 DIAGNOSIS — R2689 Other abnormalities of gait and mobility: Secondary | ICD-10-CM

## 2023-01-08 NOTE — Patient Instructions (Signed)
Standing on cushion at sink:  feet apart/feet together/feet partial heel to toe:  head turns/nods eyes open and eyes closed  Standing on the cushion facing sink:  side step and weightshift x 10, back step and weightshift x 10, FOCUS ON CONTROL

## 2023-01-08 NOTE — Therapy (Signed)
OUTPATIENT PHYSICAL THERAPY NEURO TREATMENT NOTE   Patient Name: Jim Carson MRN: [id number] DOB:1950/10/18, 72 y.o., male Today's Date: 01/08/2023   PCP: Malka So, MD REFERRING PROVIDER:Moore, Samara Deist, MD  END OF SESSION:  PT End of Session - 01/08/23 0933     Visit Number 9    Number of Visits 12    Date for PT Re-Evaluation 02/04/23    Authorization Type Medicare/AARP    PT Start Time 0934    PT Stop Time 1015    PT Time Calculation (min) 41 min    Equipment Utilized During Treatment Gait belt    Activity Tolerance Patient tolerated treatment well    Behavior During Therapy [facility] for tasks assessed/performed               Past Medical History:  Diagnosis Date   Abnormal gait    due to parkinson's,  uses cane   Allergic rhinitis    Allergy Spring 1998   Allergic to dustmits, dog hair and roaches.   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 Lorin Picket  @[facility]   Pneumonia    Sleep apnea    Vitamin B12 deficiency    Vitamin D deficiency    Wears glasses    Past Surgical History:  Procedure Laterality Date   APPENDECTOMY  07/1964   JOINT REPLACEMENT  March 2019 and again on 09/10/2021   Left hip twice.  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   @[facility] ,  [place]   desmoid fibromatosis   SHOULDER ARTHROSCOPY WITH ROTATOR CUFF REPAIR Left 12/15/2018   Procedure: SHOULDER ARTHROSCOPY, biceps tenodesis labored Debridement, submacromial decompression;  Surgeon: Eugenia Mcalpine, MD;  Location: [facility];  Service: Orthopedics;  Laterality: Left;  interscalene block   SPINE SURGERY     TOTAL HIP ARTHROPLASTY Left 09/18/2017   @[facility] , [place]   TOTAL HIP REVISION Left 09/10/2021   Procedure: Left hip acetabular versus total hip arthroplasty revision, constrained liner;  Surgeon: Ollen Gross, MD;  Location: [facility];  Service: Orthopedics;  Laterality: Left;   Patient Active Problem List   Diagnosis Date Noted   Drug induced constipation 12/30/2022   Acquired hypothyroidism 12/30/2022   Primary osteoarthritis of left hip 05/18/2021   PD (Parkinson's disease) 05/18/2021   History of gastroesophageal reflux (GERD) 05/18/2021   OSA (obstructive sleep apnea) 05/18/2021   Essential hypertension 05/18/2021    ONSET DATE: 2022  REFERRING DIAG: G20.A1 (ICD-10-CM) - Parkinson's disease   THERAPY DIAG:  Unsteadiness on feet  Other  abnormalities of gait and mobility  Muscle weakness (generalized)  Rationale for Evaluation and Treatment: Rehabilitation  SUBJECTIVE:                                                                                                                                                                                             SUBJECTIVE STATEMENT: Need to continue to work on balance.  Tight spaces are hard, reaching for things instead of getting close to things.  Pt accompanied by: self  PERTINENT HISTORY:   The last fall he had was with a 3 wheeled walker and it was in January. This is the only fall in the past 6 months.   He has R foot drag and he wears an AFO for support noting the foot rolls.He only wears  the AFO when coming to therapy or working out. He reports the foot rolls at other times.   Objective: Timed Up and Go test:15.94 seconds   10 meter walk test: 32.8/12.81 seconds=2.56 ft/sec   5 time sit to stand test:18.60 seconds   Other: Note bilat trendelenberg gait, R foot slap (even with AFO donned), forward leaning trunk posture, tandem gait due to hip weakness.  PAIN:  Are you having pain? Yes: NPRS scale: 0/10 Pain location: L LB Pain description: discomfort Aggravating factors: stretching Relieving factors: standing  PRECAUTIONS: Fall  WEIGHT BEARING RESTRICTIONS: No  FALLS: Has patient fallen in last 6 months? Yes. Number of falls 2  LIVING ENVIRONMENT: Lives with: lives with their family Lives in: House/apartment Stairs: Yes: Internal: 14 stairs to man cave steps; can reach both Has following equipment at home: Dan Humphreys - 4 wheeled  PLOF: Independent with household mobility with device and Independent with community mobility with device  PATIENT GOALS: improve balance  OBJECTIVE:    TODAY'S TREATMENT: 01/08/2023 Activity Comments  NuStep, Level 3>5, 4 extremities, for aerobic warm up x 8 minutes SPM >70  Standing on Airex: Feet apart/feet together EO, EC head turns/nods Feet partial tandem EO, EC BUE support  Standing on Airex: -side step and weightshift x 10  -forward step and weightshift x 10 -back step and weightshift x 10 UE support, cues for pace/control/foot clearance  Alt step taps to 6" step BUE support, 2 x 10         Access Code: [email or web address] URL: [email or web address] Date: 01/08/2023 Prepared by: [facility] - Outpatient  Rehab - [facility]  Program Notes Seated and standing PWR! Up, 2 x 10 repsSeated PWR! Step-step out and in, 3# weight, 3 x 10 Standing on cushion at sink:  feet apart/feet together/feet partial heel to toe:  head  turns/nods eyes open and eyes closed Standing on the cushion facing sink:  side step and  weightshift x 10, back step and weightshift x 10, FOCUS ON CONTROL  Exercises - Seated March  - 1 x daily - 5 x weekly - 3 sets - 10 reps - Seated Thoracic Flexion and Rotation with Swiss Ball  - 1 x daily - 5 x weekly - 2 sets - 10 reps - 5 sec hold  PATIENT EDUCATION: Education details: HEP additions-see above; techniques to improve balance-making sure to fully complete movement patterns (to avoid LOB with reaching) and to count steps through doorways/tight spaces Person educated: Patient Education method: Explanation, Demonstration, and Handouts Education comprehension: verbalized understanding, returned demonstration, and needs further education    ------------------------------------------------- Objective measures below taken at initial evaluation:  DIAGNOSTIC FINDINGS: n/a for this episode  COGNITION: Overall cognitive status: Within functional limits for tasks assessed   SENSATION: WFL, right plantar surface numb  COORDINATION: Able to perform rapid alternating movements  EDEMA:  none  MUSCLE TONE: normal, no spasticity or cogwheeling noted in LLE  MUSCLE LENGTH: Full knee extension in sitting    POSTURE: forward head  LOWER EXTREMITY ROM:     Active  Right Eval Left Eval  Hip flexion    Hip extension    Hip abduction    Hip adduction    Hip internal rotation    Hip external rotation    Knee flexion 110 110  Knee extension 0 0  Ankle dorsiflexion 10 10  Ankle plantarflexion    Ankle inversion    Ankle eversion     (Blank rows = not tested)  LOWER EXTREMITY MMT:    Foot drop/drag on Right ankle  BED MOBILITY:  independent  TRANSFERS: Assistive device utilized: Environmental consultant - 4 wheeled  Sit to stand: Complete Independence Stand to sit: Complete Independence Chair to chair: Complete Independence and Modified independence Floor:  NT   CURB:  Level of Assistance: Modified independence Assistive device utilized: Environmental consultant - 4 wheeled Curb Comments:    STAIRS: Level of Assistance: Modified independence Stair Negotiation Technique: Alternating Pattern  with Bilateral Rails Number of Stairs: 12  Height of Stairs: 4-6"  Comments:   GAIT: Gait pattern: trendelenburg, trunk flexed, and wide BOS Distance walked:  Assistive device utilized: Environmental consultant - 4 wheeled Level of assistance: Modified independence and SBA Comments:   FUNCTIONAL TESTS:  5 times sit to stand: 14 sec Mini-BESTest : 16/28 Berg Balance Test: TBD      PATIENT EDUCATION: Education details: assessment details Person educated: Patient Education method: Explanation Education comprehension: verbalized understanding  HOME EXERCISE PROGRAM: TBD  GOALS: Goals reviewed with patient? Yes  SHORT TERM GOALS: Target date: 01/07/2023    Patient will be independent in HEP to improve functional outcomes Baseline: Goal status: MET, 01/06/2023  2.  Demo improved balance and reduced risk for falls per score 45/56 Berg Balance Test Baseline: 34/56 12/16/2022> 41/56 01/06/2023 Goal status: PARTIALLY MET, 01/06/2023    LONG TERM GOALS: Target date: 02/04/2023    Demo improved balance, postural control, and reduced risk for falls per score 22/28 Mini-BESTest Baseline: 16/28 Goal status: IN PROGRESS  2.  Demo independence in complex HEP including activities for balance, strength, coordination, and ambulation Baseline:  Goal status: IN PROGRESS  3.  Teach-back appropriate community events/classes for those with PD Baseline:  Goal status: IN PROGRESS    ASSESSMENT:  CLINICAL IMPRESSION: Skilled PT session this visit focused on initial aerobic warm up,  followed by balance activities.  Primary focus was on compliant surface balance with varied foot positions and then with step strategy work, all requiring UE support for optimal safety and stability.  He needs cues with step strategy activities for control of movement patterns and for foot clearance.  Added to HEP to address  these activities for improved balance.  He will continue to benefit from skilled PT towards LTGs for improved balance, decreased fall risk.  OBJECTIVE IMPAIRMENTS: Abnormal gait, decreased activity tolerance, decreased balance, decreased coordination, difficulty walking, and decreased strength.   ACTIVITY LIMITATIONS: carrying, lifting, bending, transfers, and locomotion level  PARTICIPATION LIMITATIONS: cleaning, laundry, shopping, community activity, and yard work  PERSONAL FACTORS: Age, Time since onset of injury/illness/exacerbation, and 1-2 comorbidities: PD, lumbar sx, hip THR and revision  are also affecting patient's functional outcome.   REHAB POTENTIAL: Good  CLINICAL DECISION MAKING: Evolving/moderate complexity  EVALUATION COMPLEXITY: Moderate  PLAN:  PT FREQUENCY: 1-2x/week, 2x/wk x 4 wks then 1xwk/ x 4 wks  PT DURATION: 8 weeks  PLANNED INTERVENTIONS: Therapeutic exercises, Therapeutic activity, Neuromuscular re-education, Balance training, Gait training, Patient/Family education, Self Care, Joint mobilization, Stair training, Vestibular training, Canalith repositioning, DME instructions, Aquatic Therapy, Dry Needling, Electrical stimulation, Spinal mobilization, and Manual therapy  PLAN FOR NEXT SESSION: 10th visit PN; review HEP additions.  Continue work on standing hip strengthening, postural exercises, balance.  Pt going to 1x/wk frequency (he is scheduled for 1 more visit than POC).   Lonia Blood, PT 01/08/23 11:19 AM Phone: [phone] Fax: [phone]  [facility] at [facility] [address] [place], [address] [place], [place] [address] Phone # [phone] Fax # [phone]

## 2023-01-12 ENCOUNTER — Encounter: Payer: Self-pay | Admitting: Family Medicine

## 2023-01-13 MED ORDER — LOSARTAN POTASSIUM 100 MG PO TABS: 100.0000 mg | ORAL_TABLET | Freq: Every morning | ORAL | 3 refills | Status: DC

## 2023-01-15 ENCOUNTER — Ambulatory Visit: Payer: Medicare Other

## 2023-01-15 ENCOUNTER — Ambulatory Visit: Payer: Medicare Other | Admitting: Physical Therapy

## 2023-01-15 ENCOUNTER — Encounter: Payer: Self-pay | Admitting: Physical Therapy

## 2023-01-15 DIAGNOSIS — R471 Dysarthria and anarthria: Secondary | ICD-10-CM

## 2023-01-15 DIAGNOSIS — R2681 Unsteadiness on feet: Secondary | ICD-10-CM

## 2023-01-15 DIAGNOSIS — R2689 Other abnormalities of gait and mobility: Secondary | ICD-10-CM

## 2023-01-15 DIAGNOSIS — R1312 Dysphagia, oropharyngeal phase: Secondary | ICD-10-CM

## 2023-01-15 DIAGNOSIS — M6281 Muscle weakness (generalized): Secondary | ICD-10-CM

## 2023-01-15 NOTE — Therapy (Signed)
OUTPATIENT PHYSICAL THERAPY NEURO TREATMENT NOTE/10th VISIT PROGRESS NOTE   Patient Name: Jim Carson MRN: [id number] DOB:06-Jul-1950, 72 y.o., male Today's Date: 01/15/2023   PCP: Malka So, MD REFERRING PROVIDER:Moore, Samara Deist, MD  Progress Note Reporting Period 12/10/2022 to 01/15/2023  See note below for Objective Data and Assessment of Progress/Goals.      END OF SESSION:  PT End of Session - 01/15/23 0938     Visit Number 10    Number of Visits 12    Date for PT Re-Evaluation 02/04/23    Authorization Type Medicare/AARP    PT Start Time 0935    PT Stop Time 1013    PT Time Calculation (min) 38 min    Equipment Utilized During Treatment Gait belt    Activity Tolerance Patient tolerated treatment well    Behavior During Therapy WFL for tasks assessed/performed                Past Medical History:  Diagnosis Date   Abnormal gait    due to parkinson's,  uses cane   Allergic rhinitis    Allergy [place] 1998   Allergic to dustmits, dog hair and roaches.   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 Lorin Picket  @[facility]   Pneumonia    Sleep apnea    Vitamin B12 deficiency    Vitamin D deficiency    Wears glasses    Past Surgical History:  Procedure Laterality Date   APPENDECTOMY  07/1964   JOINT REPLACEMENT  March 2019 and again on 09/10/2021   Left hip twice.  2nd time due to 3 dislocations   LAPAROSCOPIC CHOLECYSTECTOMY  01-21-2017   @[facility] , [place]   POSTERIOR FUSION  CERVICAL SPINE  08-15-2011   @[facility] , [place]   w/ laminectomy and decompression-- C3 -- T1   POSTERIOR LAMINECTOMY / DECOMPRESSION LUMBAR SPINE  07-30-2013   @[facility] , [place]   bilateral T11 - 12,  L2 --5   REMOVAL LEFT T1 PARASPINOUS MASS  02-12-2016   @[facility] ,  [place]   desmoid fibromatosis   SHOULDER ARTHROSCOPY WITH ROTATOR CUFF REPAIR Left 12/15/2018   Procedure: SHOULDER ARTHROSCOPY, biceps tenodesis labored Debridement, submacromial decompression;  Surgeon: Eugenia Mcalpine, MD;  Location: [facility];  Service: Orthopedics;  Laterality: Left;  interscalene block   SPINE SURGERY     TOTAL HIP ARTHROPLASTY Left 09/18/2017   @[facility] , [place]   TOTAL HIP REVISION Left 09/10/2021   Procedure: Left hip acetabular versus total hip arthroplasty revision, constrained liner;  Surgeon: Ollen Gross, MD;  Location: [facility];  Service: Orthopedics;  Laterality: Left;   Patient Active Problem List   Diagnosis Date Noted   Drug induced constipation 12/30/2022   Acquired hypothyroidism 12/30/2022   Primary osteoarthritis of left hip 05/18/2021   PD (Parkinson's disease) 05/18/2021   History of gastroesophageal reflux (GERD) 05/18/2021   OSA (obstructive sleep apnea) 05/18/2021  Essential hypertension 05/18/2021    ONSET DATE: 2022  REFERRING DIAG: G20.A1 (ICD-10-CM) - Parkinson's disease   THERAPY DIAG:  Unsteadiness on feet  Other abnormalities of gait and mobility  Muscle weakness (generalized)  Rationale for Evaluation and Treatment: Rehabilitation  SUBJECTIVE:                                                                                                                                                                                             SUBJECTIVE STATEMENT: No pain, had a hard time with negotiating doorway with the walker and almost lost balance, was able to catch my balance.    Pt accompanied by: self  PERTINENT HISTORY:   The last fall he had was with a 3  wheeled walker and it was in January. This is the only fall in the past 6 months.   He has R foot drag and he wears an AFO for support noting the foot rolls.He only wears the AFO when coming to therapy or working out. He reports the foot rolls at other times.   Objective: Timed Up and Go test:15.94 seconds   10 meter walk test: 32.8/12.81 seconds=2.56 ft/sec   5 time sit to stand test:18.60 seconds   Other: Note bilat trendelenberg gait, R foot slap (even with AFO donned), forward leaning trunk posture, tandem gait due to hip weakness.  PAIN:  Are you having pain? Yes: NPRS scale: 0/10 Pain location: L LB Pain description: discomfort Aggravating factors: stretching Relieving factors: standing  PRECAUTIONS: Fall  WEIGHT BEARING RESTRICTIONS: No  FALLS: Has patient fallen in last 6 months? Yes. Number of falls 2  LIVING ENVIRONMENT: Lives with: lives with their family Lives in: House/apartment Stairs: Yes: Internal: 14 stairs to man cave steps; can reach both Has following equipment at home: Dan Humphreys - 4 wheeled  PLOF: Independent with household mobility with device and Independent with community mobility with device  PATIENT GOALS: improve balance  OBJECTIVE:    TODAY'S TREATMENT: 01/15/2023 Activity Comments  TUG: 16.29 sec with rollator FTSTS: 14.37 sec Gait velocity: 12.5 sec (2.6 ft/sec)   Reviewed additions to HEP last visit: Standing on Airex: Feet apart/together/feet partial tandem EO/EC head turns/nods Side step and weightshift Back step and weightshift Good return demo; reinforced using BUE support throughout and to focus on control  Sidestep along parallel bars, 5 reps, 3#, then varied numbers of steps each directions BUE support  Monster walk forward/back, 5 reps with 3# BLE BUE support  Forward/back walk, 5 reps 3# BLE BUE support  Standing on incline feet apart, EC 15-30 sec Min guard assist, using hip strategy for  improved balance     Access Code:  [email or web address] URL: [email or web address] Date: 01/08/2023 Prepared by: [facility] - Outpatient  Rehab - [facility]  Program Notes Seated and standing PWR! Up, 2 x 10 repsSeated PWR! Step-step out and in, 3# weight, 3 x 10 Standing on cushion at sink:  feet apart/feet together/feet partial heel to toe:  head turns/nods eyes open and eyes closed Standing on the cushion facing sink:  side step and weightshift x 10, back step and weightshift x 10, FOCUS ON CONTROL  Exercises - Seated March  - 1 x daily - 5 x weekly - 3 sets - 10 reps - Seated Thoracic Flexion and Rotation with Swiss Ball  - 1 x daily - 5 x weekly - 2 sets - 10 reps - 5 sec hold  PATIENT EDUCATION: Education details: Continue current HEP Person educated: Patient Education method: Explanation, Demonstration, and Handouts Education comprehension: verbalized understanding, returned demonstration, and needs further education    ------------------------------------------------- Objective measures below taken at initial evaluation:  DIAGNOSTIC FINDINGS: n/a for this episode  COGNITION: Overall cognitive status: Within functional limits for tasks assessed   SENSATION: WFL, right plantar surface numb  COORDINATION: Able to perform rapid alternating movements  EDEMA:  none  MUSCLE TONE: normal, no spasticity or cogwheeling noted in LLE  MUSCLE LENGTH: Full knee extension in sitting    POSTURE: forward head  LOWER EXTREMITY ROM:     Active  Right Eval Left Eval  Hip flexion    Hip extension    Hip abduction    Hip adduction    Hip internal rotation    Hip external rotation    Knee flexion 110 110  Knee extension 0 0  Ankle dorsiflexion 10 10  Ankle plantarflexion    Ankle inversion    Ankle eversion     (Blank rows = not tested)  LOWER EXTREMITY MMT:    Foot drop/drag on Right ankle  BED MOBILITY:  independent  TRANSFERS: Assistive device utilized: Environmental consultant - 4 wheeled  Sit to  stand: Complete Independence Stand to sit: Complete Independence Chair to chair: Complete Independence and Modified independence Floor:  NT   CURB:  Level of Assistance: Modified independence Assistive device utilized: Environmental consultant - 4 wheeled Curb Comments:   STAIRS: Level of Assistance: Modified independence Stair Negotiation Technique: Alternating Pattern  with Bilateral Rails Number of Stairs: 12  Height of Stairs: 4-6"  Comments:   GAIT: Gait pattern: trendelenburg, trunk flexed, and wide BOS Distance walked:  Assistive device utilized: Environmental consultant - 4 wheeled Level of assistance: Modified independence and SBA Comments:   FUNCTIONAL TESTS:  5 times sit to stand: 14 sec Mini-BESTest : 16/28 Berg Balance Test: TBD      PATIENT EDUCATION: Education details: assessment details Person educated: Patient Education method: Explanation Education comprehension: verbalized understanding  HOME EXERCISE PROGRAM: TBD  GOALS: Goals reviewed with patient? Yes  SHORT TERM GOALS: Target date: 01/07/2023    Patient will be independent in HEP to improve functional outcomes Baseline: Goal status: MET, 01/06/2023  2.  Demo improved balance and reduced risk for falls per score 45/56 Berg Balance Test Baseline: 34/56 12/16/2022> 41/56 01/06/2023 Goal status: PARTIALLY MET, 01/06/2023    LONG TERM GOALS: Target date: 02/04/2023    Demo improved balance, postural control, and reduced risk for falls per score 22/28 Mini-BESTest Baseline: 16/28 Goal status: IN PROGRESS  2.  Demo independence in complex HEP including activities for balance, strength, coordination, and ambulation Baseline:  Goal status: IN PROGRESS  3.  Teach-back appropriate community events/classes for those with PD Baseline:  Goal status: IN PROGRESS    ASSESSMENT:  CLINICAL IMPRESSION: Subjectively, pt reports he continues to need to work on balance.  He had LOB with rollator over a threshold, but was able to  catch his balance and not fall.  Objective measures Berg 41/56, TUG 16 sec, FTSTS 14.37 sec, gait velocity 2.67 ft/sec.  He continues to need BUE with most balance exercises, and cues for improved control to avoid LOB outside of BOS.  He does demo good return understanding of compliant surface balance HEP with EO and EC.  He remains at fall risk per objective measures above.  He will benefit from skilled PT towards remaining LTGS for improved balance, functional mobility, and decreased fall risk. OBJECTIVE IMPAIRMENTS: Abnormal gait, decreased activity tolerance, decreased balance, decreased coordination, difficulty walking, and decreased strength.   ACTIVITY LIMITATIONS: carrying, lifting, bending, transfers, and locomotion level  PARTICIPATION LIMITATIONS: cleaning, laundry, shopping, community activity, and yard work  PERSONAL FACTORS: Age, Time since onset of injury/illness/exacerbation, and 1-2 comorbidities: PD, lumbar sx, hip THR and revision  are also affecting patient's functional outcome.   REHAB POTENTIAL: Good  CLINICAL DECISION MAKING: Evolving/moderate complexity  EVALUATION COMPLEXITY: Moderate  PLAN:  PT FREQUENCY: 1-2x/week, 2x/wk x 4 wks then 1xwk/ x 4 wks  PT DURATION: 8 weeks  PLANNED INTERVENTIONS: Therapeutic exercises, Therapeutic activity, Neuromuscular re-education, Balance training, Gait training, Patient/Family education, Self Care, Joint mobilization, Stair training, Vestibular training, Canalith repositioning, DME instructions, Aquatic Therapy, Dry Needling, Electrical stimulation, Spinal mobilization, and Manual therapy  PLAN FOR NEXT SESSION: Resisted sidestepping, monster walks, working towards LTGs.Continue work on standing hip strengthening, postural exercises, balance.  Pt going to 1x/wk frequency (he is scheduled for 1 more visit than POC).   Lonia Blood, PT 01/15/23 10:18 AM Phone: [phone] Fax: [phone]  [facility] Re[facility] at  [facility] [address] [place], [address] [place], [place] [address] Phone # [phone] Fax # [phone]

## 2023-01-15 NOTE — Therapy (Signed)
OUTPATIENT SPEECH LANGUAGE PATHOLOGY PARKINSON'S TREATMENT   Patient Name: JSAON YOO MRN: [id number] DOB:September 28, 1950, 72 y.o., male Today's Date: 01/15/2023  PCP: Malka So., MD REFERRING PROVIDER: Carrie Mew, MD  END OF SESSION:  End of Session - 01/15/23 0857     Visit Number 6    Number of Visits 17    Date for SLP Re-Evaluation 02/14/23    SLP Start Time 0849    SLP Stop Time  0930    SLP Time Calculation (min) 41 min    Activity Tolerance Patient tolerated treatment well               Past Medical History:  Diagnosis Date   Abnormal gait    due to parkinson's,  uses cane   Allergic rhinitis    Allergy Spring 1998   Allergic to dustmits, dog hair and roaches.   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 Lorin Picket  @[facility]   Pneumonia    Sleep apnea    Vitamin B12 deficiency    Vitamin D deficiency    Wears glasses    Past Surgical History:  Procedure Laterality Date   APPENDECTOMY  07/1964   JOINT REPLACEMENT  March 2019 and again on 09/10/2021   Left hip twice.  2nd time due to 3 dislocations   LAPAROSCOPIC CHOLECYSTECTOMY  01-21-2017   @[facility] , [place]   POSTERIOR FUSION CERVICAL SPINE  08-15-2011   @[facility] , [place]   w/ laminectomy and decompression-- C3 -- T1   POSTERIOR LAMINECTOMY / DECOMPRESSION LUMBAR SPINE  07-30-2013   @[facility] , [place]   bilateral T11 - 12,  L2 --5   REMOVAL LEFT T1 PARASPINOUS MASS  02-12-2016   @[facility] ,  [place]   desmoid fibromatosis    SHOULDER ARTHROSCOPY WITH ROTATOR CUFF REPAIR Left 12/15/2018   Procedure: SHOULDER ARTHROSCOPY, biceps tenodesis labored Debridement, submacromial decompression;  Surgeon: Eugenia Mcalpine, MD;  Location: [facility];  Service: Orthopedics;  Laterality: Left;  interscalene block   SPINE SURGERY     TOTAL HIP ARTHROPLASTY Left 09/18/2017   @[facility] , [place]   TOTAL HIP REVISION Left 09/10/2021   Procedure: Left hip acetabular versus total hip arthroplasty revision, constrained liner;  Surgeon: Ollen Gross, MD;  Location: [facility];  Service: Orthopedics;  Laterality: Left;   Patient Active Problem List   Diagnosis Date Noted   Drug induced constipation 12/30/2022   Acquired hypothyroidism 12/30/2022   Primary osteoarthritis of left hip 05/18/2021   PD (Parkinson's disease) 05/18/2021   History of gastroesophageal reflux (GERD) 05/18/2021   OSA (obstructive sleep apnea) 05/18/2021   Essential hypertension 05/18/2021    ONSET DATE: 2013 (script dated 11/22/22)  REFERRING DIAG:  G20.A1 (ICD-10-CM) - Parkinson's disease    THERAPY DIAG:  Dysarthria and anarthria  Dysphagia, oropharyngeal phase  Rationale for Evaluation and Treatment: Rehabilitation  SUBJECTIVE:   SUBJECTIVE STATEMENT:  Has been performing SO "  ah" regularly but other SO lessons were only completed in last few days. Pt accompanied by: self  PERTINENT HISTORY: Patient has past medical history significant for Parkinson's disease diagnosed in 2013 symptomatic starting 2012, gait disturbance, dysphagia, allergic rhinitis, GERD, OSA on CPAP. Prior surgical history includes cervical spine fusion posterior 2013 and patient denies dysphagia following, left hip revision 2020, posterior laminectomy/decompression of lumbar spine, removal left T1 paraspinous mass. Pt had ST course in 2021 which was successful in improving speech loudness, and participation in swallowing exercises.  PAIN:  Are you having pain?  No  FALLS: Has patient fallen in last 6 months?  See PT evaluation for details  PATIENT GOALS: Improve loudness and swallowing  OBJECTIVE:   DIAGNOSTIC FINDINGS:  FINDINGS FROM MBS 12/11/22: (MODIFIED WITH BOLD PRINT) Clinical Impression: Patient swallow function is consistent with prior modified barium swallow study.  Mild oropharyngeal phase dysphagia continues due to patient's Parkinson's.  Minimal mastication of solids noted despite saying he felt like he chewed well.  Pharyngeal timing of swallow actually appeared improved with swallowing trigger at tongue base, vallecula with pures and piriform sinus with liquids.  Retention mostly vallecular and on the superior surface of epiglottis present due to diminished tongue base retraction and wall motility.  Patient senses retention at the piriform sinus when it is indeed located at the vallecular space. Liquid swallows are most effective to diminish amount of pudding and cracker bolus retained.  Various postures including chin, head turn right, head turn left did not appear to significantly improved pharyngeal clearance.  If any posture was helpful perhaps head turn left may have been slightly helpful.  Cueing patient to expectorate (cough) was noted to clear vallecular retention that he did want clear with dry swallows.     Motility adequacy of musculature varied during the study with epiglottic deflection better with liquids than with solids.  Patient was able to easily swallow barium tablet with liquid without deficits, with no aspiration or penetration..  Of note he did not cough or clear his throat during entire study while swallowing barium however prior to barium administration patient did clear his throat immediately post and water swallow.     Highly recommend patient follow-up with speech pathologist to establish exercise regimen to improve pharyngeal motility.  Recommend he masticates well, follow solids with liquids, maintain strength of  cough, voice, and expectoration for airway protection.  Also provided him information regarding a vacuum device for clearance of aspiration for emergent use.  Thank you for this consult.   PATIENT REPORTED OUTCOME MEASURES (PROM): Question Patient's Response  My swallowing problem has caused me to lose weight 0  2.  My swallowing problem interferes with my ability to go out to meals 0  3.  Swallowing liquids takes extra effort 0  4.  Swallowing solids takes extra effort 2  5.  Swallowing pills takes extra effort 0  6.  Swallowing is painful 0  7.  The pleasure of eating is affected by my swallowing 1  8.  When I swallow food sticks in my throat 2  9.  I cough when I eat 3  10.  Swallowing is stressful  2    Communication Effectiveness Survey        How effective is your speech in... Pt Rating  Having a conversation with family/friends 2  2.   Conversing with strangers 2  3.   Talking to someone familiar on the phone 2.5  4.   Talking to  someone unfamiliar on the phone 2.5  5.   Being part of conversation in a noisy environment  2.5  6.   Speaking to someone when angry or upset 3  7.   Conversing while travelling in the car 2.5  8.   Conversing across distance  3   TODAY'S TREATMENT:                                                                                                                                         DATE:   SO=Speak Out 01/15/23: Given "S", if pt consistency with SO does not improve SLP may have to abandon the program and modify plan for using traditional dysarthria therapy. SLP told pt he should complete lessons on his own at home, and he and SLP should be doing lesson 10 on Monday when he arrives for ST.  SPEECH: SLP worked with patient with SO lesson 5. Some tasks were modified for reduced breath support.  M-words: average 86dB, with occasional min cue to slow production and decr volume Sustained "ah" average 90 dB with initial cue to decr volume and open  mouth Glides - usual max faded to occasional mod-max cues for hold pitch at bottom and top, average 82 dB Numbers - initial min cues for "swinging" numbers instead of separate voiced productions- average 85 dB Reading (phrases) 84 dB Conversation task role playing MD office- again, average of 72 dB  Conversation going to and from ST room was 100% intelligible.   01/06/23: SPEECH: Given "s", SLP reiterated daily practice of SO lesson x2, or once with daily lesson and once with online practice. SLP repeated this x3 during today's session. Pt told SLP he would complete online session today, and cont with subsequent lesson + online practice. SLP targeted improving vocal quality and increasing intensity through progressively difficulty speech tasks using Speak Out! program, lesson 3. ST lead pt through exercises providing occasional model with min-mod-A required to achieve target dB this date. Averages this date:  M-words: 88dB, with initial cue to slow production and decr volume Sustained "ah" 87 dB initial cue to decr volume Glides - usual max faded to occasional mod-max cues for hold pitch at bottom and top, average 83 dB Numbers - mod-max initial cues for "swinging" numbers instead of separate production- average 85 dB Reading (phrases) 82 dB Conversation task - average 72 dB Speech between tasks was sub-WNL. This improved after the reading and conversation tasks.   01/01/23: SWALLOW: pt demonstrates 5/5 swallow exercises with independence, reports to daily implementation. Reita Cliche continues to use tally marks to ensure completion of all exercises.  SPEECH: SLP inquired about pt's consistency with SO, pt acknowledged he has not been consistent. SLP educated pt that if he is finding it difficult to complete daily that he needs to take a break and return to ST when he can complete daily. SLP reiterated daily practice  lesson x2, or once with daily lesson and once with online practice. Showed pt webpage with  online practice for him to navigate to on his own if desired. Pt told SLP he would complete today and subsequent days x2 as directed.  SLP targeted improving vocal quality and increasing intensity through progressively difficulty speech tasks using Speak Out! program, lesson 2. ST lead pt through exercises providing occasional model with min-mod-A required to achieve target dB this date. Averages this date:  M-words: 85dB Sustained "ah" 88 dB Reading (phrases) 82 dB Cognitive speech task 78 dB Conversational task: Conversational sample of approx 3 minutes, pt averaged 71 dB with occasional min A.  Benefiting from occasional min -mod A for improved breath support during warm up tasks, though some tasks were modified to accommodate decreased breath support. Goal is to complete warm up exercises of speak out daily before next session.   12/23/22: SWALLOW: pt demonstrates 5/5 swallow exercises with mod-I, reports to daily implementation. Using tally marks to ensure completion of all exercises.  SPEECH: SLP re-introduced pt to speak out, explained intentional speech and it's role  Target improving vocal quality and increasing intensity through progressively difficulty speech tasks using Speak Out! program, lesson 1. ST leads pt through exercises providing usual model prior to pt execution. usual mod-A required to achieve target dB this date. Averages this date:  Sustained "ah" 91 dB Reading (phrases) 80 dB Cognitive speech task 78 dB Conversational task:  Benefiting from tactile cues for improved breath support during warm up tasks, though  some tasks were modified to accommodate cognitive load and decreased breath support. Conversational sample of approx 5 minutes, pt averages 71 dB with occasional min-A. Goal is to complete warm up exercises of speak out daily between this and next session.    12/18/22: SWALLOW: reviewed pt's dysphagia HEP. SLP had to re-educate pt about procedure for [facility]. SLP  taught pt the alternate to [place] (CTAR - "chin pushback" on pt's sheet) in previous session due to pt's cervical/back issues.  SPEECH: SLP ordered pt Speak Out book today. Introduced pt to [employer] everyday tasks May-me-my-moe-moo, ah, and glide. Pt with difficulty with breath support due to other dx's. Cue to "Be intentional with your air" was helpful but pt had to rush May-Moo, and with glide req'd cues for starting hi -lo glide at roughly the same Hz he ended on with lo-hi glide. Pt was independent by session end. Told pt to complete these tasks BID in prep for start of Speak Out.  12/16/22 (eval): SLP reviewed [facility] from [facility] and provided precautions and HEP.   PATIENT EDUCATION: Education details: See "pt instructions" and "today's treatment". Person educated: Patient Education method: one or more of: Explanation, Demonstration, Verbal cues, and Handouts Education comprehension: One or more of : verbalized understanding, returned demonstration, verbal cues required, and needs further education   GOALS: Goals reviewed with patient? Yes  SHORT TERM GOALS: Target date: 01/24/23 (modified due to visit number)  Pt will complete dysphagia HEP with modified independence x2 sessions Baseline: 01/01/23 Goal status: INITIAL  2.  Pt will perform thorough mastication and alternate bite/sip precautions with POs during session with modified independence in 2 sessions Baseline:  Goal status: INITIAL  3.  Pt will complete vocal warm up tasks (ah, glides, counting, etc) with rare min A in 2 sessions Baseline:  Goal status: INITIAL  4.  Reita Cliche will improve speech volume in 5 minutes simple conversation to low 70s dB in 3 sessions  Baseline:  Goal status: INITIAL  5.  Pt will demonstrate completion of home exercises as prescribed 90% compliance in first 2 weeks Baseline:  Goal status: INITIAL   LONG TERM GOALS: Target date: 02/14/23  Pt will complete dysphagia HEP with independence x3  sessions Baseline:  Goal status: INITIAL  2.  Pt will perform thorough mastication and alternate bite/sip precautions with POs during session with independence in 3 sessions Baseline:  Goal status: INITIAL  3.  Pt will complete vocal warm up tasks (ah, glides, counting, etc) with modified independence in 3 sessions Baseline:  Goal status: INITIAL  4.  Reita Cliche will improve speech volume in 10 minutes simple-mod complex conversation to low 70s dB in 3 sessions Baseline:  Goal status: INITIAL  5.  In last 1-2 sessions, both of Bobby's PROM scores will improve compared to initial administration Baseline:  Goal status: INITIAL  6.  Reita Cliche will tell SLP what HEP to perform in order to maintain WNL volume speech over time Baseline:  Goal status: INITIAL  ASSESSMENT:  CLINICAL IMPRESSION: STG date changed due to visit number. Patient is a 72 y.o. M who was seen today for treatment of voice (reduced voice volume), and review MBS study and initiate HEP for dysphagia. See "today's treatment" for details of today's session. If consistency of SO does not improve SLP will change pt's plan to use more traditional dysarthria therapy.  OBJECTIVE IMPAIRMENTS: Objective impairments include dysarthria and dysphagia. These impairments are limiting patient from household responsibilities, ADLs/IADLs, effectively communicating at home and in community, and safety when swallowing.Factors affecting potential to achieve goals and functional outcome are  none noted .Marland Kitchen Patient will benefit from skilled SLP services to address above impairments and improve overall function.  REHAB POTENTIAL: Good  PLAN:  SLP FREQUENCY: 2x/week  SLP DURATION: 8 weeks  PLANNED INTERVENTIONS: Aspiration precaution training, Pharyngeal strengthening exercises, Diet toleration management , Environmental controls, Internal/external aids, Oral motor exercises, Functional tasks, SLP instruction and feedback, Compensatory strategies,  and Patient/family education    [facility], CCC-SLP 01/15/2023, 9:37 AM

## 2023-01-20 ENCOUNTER — Ambulatory Visit: Payer: Medicare Other

## 2023-01-20 DIAGNOSIS — R471 Dysarthria and anarthria: Secondary | ICD-10-CM

## 2023-01-20 DIAGNOSIS — R1312 Dysphagia, oropharyngeal phase: Secondary | ICD-10-CM

## 2023-01-20 DIAGNOSIS — R2681 Unsteadiness on feet: Secondary | ICD-10-CM | POA: Diagnosis not present

## 2023-01-20 NOTE — Therapy (Signed)
OUTPATIENT SPEECH LANGUAGE PATHOLOGY PARKINSON'S TREATMENT   Patient Name: Jim Carson MRN: [id number] DOB:1951-04-03, 72 y.o., male Today's Date: 01/20/2023  PCP: Malka So., MD REFERRING PROVIDER: Carrie Mew, MD  END OF SESSION:  End of Session - 01/20/23 0853     Visit Number 7    Number of Visits 17    Date for SLP Re-Evaluation 02/14/23    SLP Start Time 0851    SLP Stop Time  0930    SLP Time Calculation (min) 39 min    Activity Tolerance Patient tolerated treatment well               Past Medical History:  Diagnosis Date   Abnormal gait    due to parkinson's,  uses cane   Allergic rhinitis    Allergy Spring 1998   Allergic to dustmits, dog hair and roaches.   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 Lorin Picket  @[facility]   Pneumonia    Sleep apnea    Vitamin B12 deficiency    Vitamin D deficiency    Wears glasses    Past Surgical History:  Procedure Laterality Date   APPENDECTOMY  07/1964   JOINT REPLACEMENT  March 2019 and again on 09/10/2021   Left hip twice.  2nd time due to 3 dislocations   LAPAROSCOPIC CHOLECYSTECTOMY  01-21-2017   @[facility] , [place]   POSTERIOR FUSION CERVICAL SPINE  08-15-2011   @[facility] , [place]   w/ laminectomy and decompression-- C3 -- T1   POSTERIOR LAMINECTOMY / DECOMPRESSION LUMBAR SPINE  07-30-2013   @[facility] , [place]   bilateral T11 - 12,  L2 --5   REMOVAL LEFT T1 PARASPINOUS MASS  02-12-2016   @[facility] ,  [place]   desmoid fibromatosis    SHOULDER ARTHROSCOPY WITH ROTATOR CUFF REPAIR Left 12/15/2018   Procedure: SHOULDER ARTHROSCOPY, biceps tenodesis labored Debridement, submacromial decompression;  Surgeon: Eugenia Mcalpine, MD;  Location: [facility];  Service: Orthopedics;  Laterality: Left;  interscalene block   SPINE SURGERY     TOTAL HIP ARTHROPLASTY Left 09/18/2017   @[facility] , [place]   TOTAL HIP REVISION Left 09/10/2021   Procedure: Left hip acetabular versus total hip arthroplasty revision, constrained liner;  Surgeon: Ollen Gross, MD;  Location: [facility];  Service: Orthopedics;  Laterality: Left;   Patient Active Problem List   Diagnosis Date Noted   Drug induced constipation 12/30/2022   Acquired hypothyroidism 12/30/2022   Primary osteoarthritis of left hip 05/18/2021   PD (Parkinson's disease) 05/18/2021   History of gastroesophageal reflux (GERD) 05/18/2021   OSA (obstructive sleep apnea) 05/18/2021   Essential hypertension 05/18/2021    ONSET DATE: 2013 (script dated 11/22/22)  REFERRING DIAG:  G20.A1 (ICD-10-CM) - Parkinson's disease    THERAPY DIAG:  Dysarthria and anarthria  Dysphagia, oropharyngeal phase  Rationale for Evaluation and Treatment: Rehabilitation  SUBJECTIVE:   SUBJECTIVE STATEMENT:  Has been performing SO "  ah" regularly but other SO lessons were only completed in last few days. Pt accompanied by: self  PERTINENT HISTORY: Patient has past medical history significant for Parkinson's disease diagnosed in 2013 symptomatic starting 2012, gait disturbance, dysphagia, allergic rhinitis, GERD, OSA on CPAP. Prior surgical history includes cervical spine fusion posterior 2013 and patient denies dysphagia following, left hip revision 2020, posterior laminectomy/decompression of lumbar spine, removal left T1 paraspinous mass. Pt had ST course in 2021 which was successful in improving speech loudness, and participation in swallowing exercises.  PAIN:  Are you having pain?  No  FALLS: Has patient fallen in last 6 months?  See PT evaluation for details  PATIENT GOALS: Improve loudness and swallowing  OBJECTIVE:   DIAGNOSTIC FINDINGS:  FINDINGS FROM MBS 12/11/22: (MODIFIED WITH BOLD PRINT) Clinical Impression: Patient swallow function is consistent with prior modified barium swallow study.  Mild oropharyngeal phase dysphagia continues due to patient's Parkinson's.  Minimal mastication of solids noted despite saying he felt like he chewed well.  Pharyngeal timing of swallow actually appeared improved with swallowing trigger at tongue base, vallecula with pures and piriform sinus with liquids.  Retention mostly vallecular and on the superior surface of epiglottis present due to diminished tongue base retraction and wall motility.  Patient senses retention at the piriform sinus when it is indeed located at the vallecular space. Liquid swallows are most effective to diminish amount of pudding and cracker bolus retained.  Various postures including chin, head turn right, head turn left did not appear to significantly improved pharyngeal clearance.  If any posture was helpful perhaps head turn left may have been slightly helpful.  Cueing patient to expectorate (cough) was noted to clear vallecular retention that he did want clear with dry swallows.     Motility adequacy of musculature varied during the study with epiglottic deflection better with liquids than with solids.  Patient was able to easily swallow barium tablet with liquid without deficits, with no aspiration or penetration..  Of note he did not cough or clear his throat during entire study while swallowing barium however prior to barium administration patient did clear his throat immediately post and water swallow.     Highly recommend patient follow-up with speech pathologist to establish exercise regimen to improve pharyngeal motility.  Recommend he masticates well, follow solids with liquids, maintain strength of  cough, voice, and expectoration for airway protection.  Also provided him information regarding a vacuum device for clearance of aspiration for emergent use.  Thank you for this consult.   PATIENT REPORTED OUTCOME MEASURES (PROM): Question Patient's Response  My swallowing problem has caused me to lose weight 0  2.  My swallowing problem interferes with my ability to go out to meals 0  3.  Swallowing liquids takes extra effort 0  4.  Swallowing solids takes extra effort 2  5.  Swallowing pills takes extra effort 0  6.  Swallowing is painful 0  7.  The pleasure of eating is affected by my swallowing 1  8.  When I swallow food sticks in my throat 2  9.  I cough when I eat 3  10.  Swallowing is stressful  2    Communication Effectiveness Survey        How effective is your speech in... Pt Rating  Having a conversation with family/friends 2  2.   Conversing with strangers 2  3.   Talking to someone familiar on the phone 2.5  4.   Talking to  someone unfamiliar on the phone 2.5  5.   Being part of conversation in a noisy environment  2.5  6.   Speaking to someone when angry or upset 3  7.   Conversing while travelling in the car 2.5  8.   Conversing across distance  3   TODAY'S TREATMENT:                                                                                                                                         DATE:   SO=Speak Out 01/20/23: SPEECH: Pt indicated the last lesson he completed was Lesson 6. So SLP assisted pt in completing Lesson 7. SLP asked pt what was th ebarrier to consitency with SP and pt stated he needed to have a specific time each day to complete. SLP worked with pt to find these times and stated that he could begin his first lesson of the day between 10-11AM, and his second lesson between 5-6PM. If this does not work well for pt SLP and pt agreed to shelf SO and do traditional ST for loudness/clarity.  M-words: average 85dB,  Sustained "ah" average 90 dB   Glides - occasional mod faded to rare min cues for hold pitch at bottom and top, average 83 dB Numbers - rare min cues for "swinging" numbers instead of separate voiced productions- average 84 dB Reading (phrases) 82 dB Conversation task role playing ordering apple pie- average of 72 dB  Conversation going to and from ST room was 100% intelligible however was with average upper 60s dB SWALLOW: Pt completed his swallow HEP with SBA.  01/15/23: Given "S", if pt consistency with SO does not improve SLP may have to abandon the program and modify plan for using traditional dysarthria therapy. SLP told pt he should complete lessons on his own at home, and he and SLP should be doing lesson 10 on Monday when he arrives for ST.  SPEECH: SLP worked with patient with SO lesson 5. Some tasks were modified for reduced breath support.  M-words: average 86dB, with occasional min cue to slow production and decr volume Sustained "ah" average 90 dB with initial cue to decr volume and open mouth Glides - usual max faded to occasional mod-max cues for hold pitch at bottom and top, average 82 dB Numbers - initial min cues for "swinging" numbers instead of separate voiced productions- average 85 dB Reading (phrases) 84 dB Conversation task role playing MD office- again, average of 72 dB  Conversation going to and from ST room was 100% intelligible.   01/06/23: SPEECH: Given "s", SLP reiterated daily practice of SO lesson x2, or once with daily lesson and once with online practice. SLP repeated this x3 during today's session. Pt told SLP he would complete online session today, and cont with subsequent lesson + online practice. SLP targeted improving vocal quality and increasing intensity through progressively difficulty  speech tasks using Speak Out! program, lesson 3. ST lead pt through exercises providing occasional model with min-mod-A required to achieve target dB this date. Averages this date:  M-words: 88dB, with  initial cue to slow production and decr volume Sustained "ah" 87 dB initial cue to decr volume Glides - usual max faded to occasional mod-max cues for hold pitch at bottom and top, average 83 dB Numbers - mod-max initial cues for "swinging" numbers instead of separate production- average 85 dB Reading (phrases) 82 dB Conversation task - average 72 dB Speech between tasks was sub-WNL. This improved after the reading and conversation tasks.   01/01/23: SWALLOW: pt demonstrates 5/5 swallow exercises with independence, reports to daily implementation. Reita Cliche continues to use tally marks to ensure completion of all exercises.  SPEECH: SLP inquired about pt's consistency with SO, pt acknowledged he has not been consistent. SLP educated pt that if he is finding it difficult to complete daily that he needs to take a break and return to ST when he can complete daily. SLP reiterated daily practice lesson x2, or once with daily lesson and once with online practice. Showed pt webpage with online practice for him to navigate to on his own if desired. Pt told SLP he would complete today and subsequent days x2 as directed.  SLP targeted improving vocal quality and increasing intensity through progressively difficulty speech tasks using Speak Out! program, lesson 2. ST lead pt through exercises providing occasional model with min-mod-A required to achieve target dB this date. Averages this date:  M-words: 85dB Sustained "ah" 88 dB Reading (phrases) 82 dB Cognitive speech task 78 dB Conversational task: Conversational sample of approx 3 minutes, pt averaged 71 dB with occasional min A.  Benefiting from occasional min -mod A for improved breath support during warm up tasks, though some tasks were modified to accommodate decreased breath support. Goal is to complete warm up exercises of speak out daily before next session.   12/23/22: SWALLOW: pt demonstrates 5/5 swallow exercises with mod-I, reports to daily  implementation. Using tally marks to ensure completion of all exercises.  SPEECH: SLP re-introduced pt to speak out, explained intentional speech and it's role  Target improving vocal quality and increasing intensity through progressively difficulty speech tasks using Speak Out! program, lesson 1. ST leads pt through exercises providing usual model prior to pt execution. usual mod-A required to achieve target dB this date. Averages this date:  Sustained "ah" 91 dB Reading (phrases) 80 dB Cognitive speech task 78 dB Conversational task:  Benefiting from tactile cues for improved breath support during warm up tasks, though  some tasks were modified to accommodate cognitive load and decreased breath support. Conversational sample of approx 5 minutes, pt averages 71 dB with occasional min-A. Goal is to complete warm up exercises of speak out daily between this and next session.    12/18/22: SWALLOW: reviewed pt's dysphagia HEP. SLP had to re-educate pt about procedure for [facility]. SLP taught pt the alternate to [employer] (CTAR - "chin pushback" on pt's sheet) in previous session due to pt's cervical/back issues.  SPEECH: SLP ordered pt Speak Out book today. Introduced pt to [employer] everyday tasks May-me-my-moe-moo, ah, and glide. Pt with difficulty with breath support due to other dx's. Cue to "Be intentional with your air" was helpful but pt had to rush May-Moo, and with glide req'd cues for starting hi -lo glide at roughly the same Hz he ended on with lo-hi glide. Pt was independent by session  end. Told pt to complete these tasks BID in prep for start of Speak Out.  12/16/22 (eval): SLP reviewed results from [facility] and provided precautions and HEP.   PATIENT EDUCATION: Education details: See "pt instructions" and "today's treatment". Person educated: Patient Education method: one or more of: Explanation, Demonstration, Verbal cues, and Handouts Education comprehension: One or more of : verbalized  understanding, returned demonstration, verbal cues required, and needs further education   GOALS: Goals reviewed with patient? Yes  SHORT TERM GOALS: Target date: 01/24/23 (modified due to visit number)  Pt will complete dysphagia HEP with modified independence x2 sessions Baseline: 01/01/23 Goal status: MET  2.  Pt will perform thorough mastication and alternate bite/sip precautions with POs during session with modified independence in 2 sessions Baseline:  Goal status: INITIAL  3.  Pt will complete vocal warm up tasks (ah, glides, counting, etc) with rare min A in 2 sessions Baseline: 01/20/23 Goal status: INITIAL  4.  Reita Cliche will improve speech volume in 5 minutes simple conversation to low 70s dB in 3 sessions Baseline:  Goal status: INITIAL  5.  Pt will demonstrate completion of home exercises as prescribed 90% compliance in first 2 weeks Baseline:  Goal status: NOT MET   LONG TERM GOALS: Target date: 02/14/23  Pt will complete dysphagia HEP with independence x3 sessions Baseline:  Goal status: INITIAL  2.  Pt will perform thorough mastication and alternate bite/sip precautions with POs during session with independence in 3 sessions Baseline:  Goal status: INITIAL  3.  Pt will complete vocal warm up tasks (ah, glides, counting, etc) with modified independence in 3 sessions Baseline:  Goal status: INITIAL  4.  Reita Cliche will improve speech volume in 10 minutes simple-mod complex conversation to low 70s dB in 3 sessions Baseline:  Goal status: INITIAL  5.  In last 1-2 sessions, both of Bobby's PROM scores will improve compared to initial administration Baseline:  Goal status: INITIAL  6.  Reita Cliche will tell SLP what HEP to perform in order to maintain WNL volume speech over time Baseline:  Goal status: INITIAL  ASSESSMENT:  CLINICAL IMPRESSION: STG date changed due to visit number. Patient is a 72 y.o. M who was seen today for treatment of voice (reduced voice volume),  and review MBS study and initiate HEP for dysphagia. See "today's treatment" for details of today's session. If consistency of SO does not improve SLP will change pt's plan to use more traditional dysarthria therapy.  OBJECTIVE IMPAIRMENTS: Objective impairments include dysarthria and dysphagia. These impairments are limiting patient from household responsibilities, ADLs/IADLs, effectively communicating at home and in community, and safety when swallowing.Factors affecting potential to achieve goals and functional outcome are  none noted .Marland Kitchen Patient will benefit from skilled SLP services to address above impairments and improve overall function.  REHAB POTENTIAL: Good  PLAN:  SLP FREQUENCY: 2x/week  SLP DURATION: 8 weeks  PLANNED INTERVENTIONS: Aspiration precaution training, Pharyngeal strengthening exercises, Diet toleration management , Environmental controls, Internal/external aids, Oral motor exercises, Functional tasks, SLP instruction and feedback, Compensatory strategies, and Patient/family education    [facility], CCC-SLP 01/20/2023, 8:54 AM

## 2023-01-22 ENCOUNTER — Ambulatory Visit: Payer: Medicare Other | Admitting: Physical Therapy

## 2023-01-23 ENCOUNTER — Encounter: Payer: Self-pay | Admitting: Physical Therapy

## 2023-01-23 ENCOUNTER — Ambulatory Visit: Payer: Medicare Other

## 2023-01-23 ENCOUNTER — Ambulatory Visit: Payer: Medicare Other | Admitting: Physical Therapy

## 2023-01-23 DIAGNOSIS — R2681 Unsteadiness on feet: Secondary | ICD-10-CM

## 2023-01-23 DIAGNOSIS — R1312 Dysphagia, oropharyngeal phase: Secondary | ICD-10-CM

## 2023-01-23 DIAGNOSIS — R2689 Other abnormalities of gait and mobility: Secondary | ICD-10-CM

## 2023-01-23 DIAGNOSIS — M6281 Muscle weakness (generalized): Secondary | ICD-10-CM

## 2023-01-23 DIAGNOSIS — R471 Dysarthria and anarthria: Secondary | ICD-10-CM

## 2023-01-23 NOTE — Therapy (Signed)
OUTPATIENT PHYSICAL THERAPY NEURO TREATMENT NOTE   Patient Name: Jim Carson MRN: [id number] DOB:28-Jan-1951, 72 y.o., male Today's Date: 01/24/2023   PCP: Malka So, MD REFERRING PROVIDER:Moore, Samara Deist, MD     END OF SESSION:  PT End of Session - 01/23/23 1407     Visit Number 11    Number of Visits 12    Date for PT Re-Evaluation 02/04/23    Authorization Type Medicare/AARP    PT Start Time 1406    PT Stop Time 1446    PT Time Calculation (min) 40 min    Equipment Utilized During Treatment Gait belt    Activity Tolerance Patient tolerated treatment well    Behavior During Therapy WFL for tasks assessed/performed                Past Medical History:  Diagnosis Date   Abnormal gait    due to parkinson's,  uses cane   Allergic rhinitis    Allergy Spring 1998   Allergic to dustmits, dog hair and roaches.   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 Lorin Picket  @[facility]   Pneumonia    Sleep apnea    Vitamin B12 deficiency    Vitamin D deficiency    Wears glasses    Past Surgical History:  Procedure Laterality Date   APPENDECTOMY  07/1964   JOINT REPLACEMENT  March 2019 and again on 09/10/2021   Left hip twice.  2nd time due to 3 dislocations   LAPAROSCOPIC CHOLECYSTECTOMY  01-21-2017   @[facility] , [place]   POSTERIOR FUSION CERVICAL SPINE  08-15-2011   @[facility] , [place]   w/ laminectomy and decompression-- C3 -- T1   POSTERIOR LAMINECTOMY / DECOMPRESSION LUMBAR SPINE  07-30-2013    @[facility] , [place]   bilateral T11 - 12,  L2 --5   REMOVAL LEFT T1 PARASPINOUS MASS  02-12-2016   @[facility] ,  [place]   desmoid fibromatosis   SHOULDER ARTHROSCOPY WITH ROTATOR CUFF REPAIR Left 12/15/2018   Procedure: SHOULDER ARTHROSCOPY, biceps tenodesis labored Debridement, submacromial decompression;  Surgeon: Eugenia Mcalpine, MD;  Location: [facility];  Service: Orthopedics;  Laterality: Left;  interscalene block   SPINE SURGERY     TOTAL HIP ARTHROPLASTY Left 09/18/2017   @[facility] , [place]   TOTAL HIP REVISION Left 09/10/2021   Procedure: Left hip acetabular versus total hip arthroplasty revision, constrained liner;  Surgeon: Ollen Gross, MD;  Location: [facility];  Service: Orthopedics;  Laterality: Left;   Patient Active Problem List   Diagnosis Date Noted   Drug induced constipation 12/30/2022   Acquired hypothyroidism 12/30/2022   Primary osteoarthritis of left hip 05/18/2021   PD (Parkinson's disease) 05/18/2021   History of gastroesophageal reflux (GERD) 05/18/2021   OSA (obstructive sleep apnea) 05/18/2021   Essential hypertension 05/18/2021    ONSET DATE: 2022  REFERRING DIAG: G20.A1 (ICD-10-CM) - Parkinson's disease   THERAPY DIAG:  Unsteadiness  on feet  Other abnormalities of gait and mobility  Muscle weakness (generalized)  Rationale for Evaluation and Treatment: Rehabilitation  SUBJECTIVE:                                                                                                                                                                                             SUBJECTIVE STATEMENT: No changes, no falls   Pt accompanied by: self  PERTINENT HISTORY:   The last fall he had was with a 3 wheeled walker and it was in January. This is the only fall in the past 6 months.   He has R foot drag and he wears an AFO for support noting the foot rolls.He only wears the AFO when coming to therapy or working out. He reports the foot rolls at other  times.   Objective: Timed Up and Go test:15.94 seconds   10 meter walk test: 32.8/12.81 seconds=2.56 ft/sec   5 time sit to stand test:18.60 seconds   Other: Note bilat trendelenberg gait, R foot slap (even with AFO donned), forward leaning trunk posture, tandem gait due to hip weakness.  PAIN:  Are you having pain? Yes: NPRS scale: 0/10 Pain location: L LB Pain description: discomfort Aggravating factors: stretching Relieving factors: standing  PRECAUTIONS: Fall  WEIGHT BEARING RESTRICTIONS: No  FALLS: Has patient fallen in last 6 months? Yes. Number of falls 2  LIVING ENVIRONMENT: Lives with: lives with their family Lives in: House/apartment Stairs: Yes: Internal: 14 stairs to man cave steps; can reach both Has following equipment at home: Dan Humphreys - 4 wheeled  PLOF: Independent with household mobility with device and Independent with community mobility with device  PATIENT GOALS: improve balance  OBJECTIVE:    TODAY'S TREATMENT: 01/23/2023 Activity Comments  Standing on Airex:  -Feet apart/feet together with head turns/nods EO/EC. -Feet together EC 30 sec with lessening UE support, head steady x 2 reps -partial tandem stance  BUE to 1 UE support at times  Standing on incline -UE lifts -trunk rotation/reaching -forward/back step and weightshifting   Backward walking in parallel bars-unpredictable  change of directions BUE support  Standing on incline with EC, no UE support 30 sec x 2 reps Min guard assist            Access Code: [email or web address] URL: [email or web address] Date: 01/08/2023 Prepared by: [facility] - [facility]  Program Notes Seated and standing PWR! Up, 2 x 10 repsSeated PWR! Step-step out and in, 3# weight, 3 x 10 Standing on cushion at sink:  feet apart/feet together/feet partial heel to toe:  head turns/nods eyes open and  eyes closed Standing on the cushion facing sink:  side step and weightshift x 10,  back step and weightshift x 10, FOCUS ON CONTROL  Exercises - Seated March  - 1 x daily - 5 x weekly - 3 sets - 10 reps - Seated Thoracic Flexion and Rotation with Swiss Ball  - 1 x daily - 5 x weekly - 2 sets - 10 reps - 5 sec hold  PATIENT EDUCATION: Education details: Continue current HEP; discussed POC (possible next visit?) Person educated: Patient Education method: Explanation, Demonstration, and Handouts Education comprehension: verbalized understanding, returned demonstration, and needs further education    ------------------------------------------------- Objective measures below taken at initial evaluation:  DIAGNOSTIC FINDINGS: n/a for this episode  COGNITION: Overall cognitive status: Within functional limits for tasks assessed   SENSATION: WFL, right plantar surface numb  COORDINATION: Able to perform rapid alternating movements  EDEMA:  none  MUSCLE TONE: normal, no spasticity or cogwheeling noted in LLE  MUSCLE LENGTH: Full knee extension in sitting    POSTURE: forward head  LOWER EXTREMITY ROM:     Active  Right Eval Left Eval  Hip flexion    Hip extension    Hip abduction    Hip adduction    Hip internal rotation    Hip external rotation    Knee flexion 110 110  Knee extension 0 0  Ankle dorsiflexion 10 10  Ankle plantarflexion    Ankle inversion    Ankle eversion     (Blank rows = not tested)  LOWER EXTREMITY MMT:    Foot drop/drag on Right ankle  BED MOBILITY:  independent  TRANSFERS: Assistive device utilized: Environmental consultant - 4 wheeled  Sit to stand: Complete Independence Stand to sit: Complete Independence Chair to chair: Complete Independence and Modified independence Floor:  NT   CURB:  Level of Assistance: Modified independence Assistive device utilized: Environmental consultant - 4 wheeled Curb Comments:   STAIRS: Level of Assistance: Modified independence Stair Negotiation Technique: Alternating Pattern  with Bilateral Rails Number of  Stairs: 12  Height of Stairs: 4-6"  Comments:   GAIT: Gait pattern: trendelenburg, trunk flexed, and wide BOS Distance walked:  Assistive device utilized: Environmental consultant - 4 wheeled Level of assistance: Modified independence and SBA Comments:   FUNCTIONAL TESTS:  5 times sit to stand: 14 sec Mini-BESTest : 16/28 Berg Balance Test: TBD      PATIENT EDUCATION: Education details: assessment details Person educated: Patient Education method: Explanation Education comprehension: verbalized understanding  HOME EXERCISE PROGRAM: TBD  GOALS: Goals reviewed with patient? Yes  SHORT TERM GOALS: Target date: 01/07/2023    Patient will be independent in HEP to improve functional outcomes Baseline: Goal status: MET, 01/06/2023  2.  Demo improved balance and reduced risk for falls per score 45/56 Berg Balance Test Baseline: 34/56 12/16/2022> 41/56 01/06/2023 Goal status: PARTIALLY MET, 01/06/2023    LONG TERM GOALS: Target date: 02/04/2023    Demo improved balance, postural control, and reduced risk for falls per score 22/28 Mini-BESTest Baseline: 16/28 Goal status: IN PROGRESS  2.  Demo independence in complex HEP including activities for balance, strength, coordination, and ambulation Baseline:  Goal status: IN PROGRESS  3.  Teach-back appropriate community events/classes for those with PD Baseline:  Goal status: IN PROGRESS    ASSESSMENT:  CLINICAL IMPRESSION: Skilled PT session focused on balance on varied surfaces, varied foot positions and inclines/declines for multi-sensory balance training.  With EC, he has increased posterior LOB and prefers UE support.  After  working on inclines and declines throughout session, he is able to hold EC on incline 30 seconds with moderate sway.  He continues to have decreased balance and control, but continues to report no falls.  He reports doing his HEP consistently.  He will benefit from continued therapy to further address balance and work  towards LTGs. OBJECTIVE IMPAIRMENTS: Abnormal gait, decreased activity tolerance, decreased balance, decreased coordination, difficulty walking, and decreased strength.   ACTIVITY LIMITATIONS: carrying, lifting, bending, transfers, and locomotion level  PARTICIPATION LIMITATIONS: cleaning, laundry, shopping, community activity, and yard work  PERSONAL FACTORS: Age, Time since onset of injury/illness/exacerbation, and 1-2 comorbidities: PD, lumbar sx, hip THR and revision  are also affecting patient's functional outcome.   REHAB POTENTIAL: Good  CLINICAL DECISION MAKING: Evolving/moderate complexity  EVALUATION COMPLEXITY: Moderate  PLAN:  PT FREQUENCY: 1-2x/week, 2x/wk x 4 wks then 1xwk/ x 4 wks  PT DURATION: 8 weeks  PLANNED INTERVENTIONS: Therapeutic exercises, Therapeutic activity, Neuromuscular re-education, Balance training, Gait training, Patient/Family education, Self Care, Joint mobilization, Stair training, Vestibular training, Canalith repositioning, DME instructions, Aquatic Therapy, Dry Needling, Electrical stimulation, Spinal mobilization, and Manual therapy  PLAN FOR NEXT SESSION: Next week is last week in POC-need to assess LTGs and discuss further PT.  *If you discharge next visit, please make sure to recommend to patient to schedule return PT (speech and OT) screens in 6-9 months.    Lonia Blood, PT 01/24/23 8:58 AM Phone: [phone] Fax: [phone]  [facility] at [facility] [address] [place], [address] [place], [place] [address] Phone # [phone] Fax # [phone]

## 2023-01-23 NOTE — Therapy (Signed)
OUTPATIENT SPEECH LANGUAGE PATHOLOGY PARKINSON'S TREATMENT   Patient Name: Jim Carson MRN: [id number] DOB:December 29, 1950, 72 y.o., male Today's Date: 01/23/2023  PCP: Malka So., MD REFERRING PROVIDER: Carrie Mew, MD  END OF SESSION:  End of Session - 01/23/23 1453     Visit Number 8    Number of Visits 17    Date for SLP Re-Evaluation 02/14/23    SLP Start Time 1450    SLP Stop Time  1530    SLP Time Calculation (min) 40 min    Activity Tolerance Patient tolerated treatment well               Past Medical History:  Diagnosis Date   Abnormal gait    due to parkinson's,  uses cane   Allergic rhinitis    Allergy Spring 1998   Allergic to dustmits, dog hair and roaches.   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 Lorin Picket  @[facility]   Pneumonia    Sleep apnea    Vitamin B12 deficiency    Vitamin D deficiency    Wears glasses    Past Surgical History:  Procedure Laterality Date   APPENDECTOMY  07/1964   JOINT REPLACEMENT  March 2019 and again on 09/10/2021   Left hip twice.  2nd time due to 3 dislocations   LAPAROSCOPIC CHOLECYSTECTOMY  01-21-2017   @[facility] , [place]   POSTERIOR FUSION CERVICAL SPINE  08-15-2011   @[facility] , [place]   w/ laminectomy and decompression-- C3 -- T1   POSTERIOR LAMINECTOMY / DECOMPRESSION LUMBAR SPINE  07-30-2013   @[facility] , [place]   bilateral T11 - 12,  L2 --5   REMOVAL LEFT T1 PARASPINOUS MASS  02-12-2016   @[facility] ,  [place]   desmoid fibromatosis    SHOULDER ARTHROSCOPY WITH ROTATOR CUFF REPAIR Left 12/15/2018   Procedure: SHOULDER ARTHROSCOPY, biceps tenodesis labored Debridement, submacromial decompression;  Surgeon: Eugenia Mcalpine, MD;  Location: [facility];  Service: Orthopedics;  Laterality: Left;  interscalene block   SPINE SURGERY     TOTAL HIP ARTHROPLASTY Left 09/18/2017   @[facility] , [place]   TOTAL HIP REVISION Left 09/10/2021   Procedure: Left hip acetabular versus total hip arthroplasty revision, constrained liner;  Surgeon: Ollen Gross, MD;  Location: [facility];  Service: Orthopedics;  Laterality: Left;   Patient Active Problem List   Diagnosis Date Noted   Drug induced constipation 12/30/2022   Acquired hypothyroidism 12/30/2022   Primary osteoarthritis of left hip 05/18/2021   PD (Parkinson's disease) 05/18/2021   History of gastroesophageal reflux (GERD) 05/18/2021   OSA (obstructive sleep apnea) 05/18/2021   Essential hypertension 05/18/2021    ONSET DATE: 2013 (script dated 11/22/22)  REFERRING DIAG:  G20.A1 (ICD-10-CM) - Parkinson's disease    THERAPY DIAG:  Dysarthria and anarthria  Dysphagia, oropharyngeal phase  Rationale for Evaluation and Treatment: Rehabilitation  SUBJECTIVE:   SUBJECTIVE STATEMENT:  Pt reports he was  more consistent with SO since last session. Pt accompanied by: self  PERTINENT HISTORY: Patient has past medical history significant for Parkinson's disease diagnosed in 2013 symptomatic starting 2012, gait disturbance, dysphagia, allergic rhinitis, GERD, OSA on CPAP. Prior surgical history includes cervical spine fusion posterior 2013 and patient denies dysphagia following, left hip revision 2020, posterior laminectomy/decompression of lumbar spine, removal left T1 paraspinous mass. Pt had ST course in 2021 which was successful in improving speech loudness, and participation in swallowing exercises.  PAIN:  Are you having pain? No  FALLS: Has patient fallen in last 6  months?  See PT evaluation for details  PATIENT GOALS: Improve loudness and swallowing  OBJECTIVE:   DIAGNOSTIC FINDINGS:  FINDINGS FROM MBS 12/11/22: (MODIFIED WITH BOLD PRINT) Clinical Impression: Patient swallow function is consistent with prior modified barium swallow study.  Mild oropharyngeal phase dysphagia continues due to patient's Parkinson's.  Minimal mastication of solids noted despite saying he felt like he chewed well.  Pharyngeal timing of swallow actually appeared improved with swallowing trigger at tongue base, vallecula with pures and piriform sinus with liquids.  Retention mostly vallecular and on the superior surface of epiglottis present due to diminished tongue base retraction and wall motility.  Patient senses retention at the piriform sinus when it is indeed located at the vallecular space. Liquid swallows are most effective to diminish amount of pudding and cracker bolus retained.  Various postures including chin, head turn right, head turn left did not appear to significantly improved pharyngeal clearance.  If any posture was helpful perhaps head turn left may have been slightly helpful.  Cueing patient to expectorate (cough) was noted to clear vallecular retention that he did want clear with dry swallows.     Motility adequacy of musculature varied during the study with epiglottic deflection better with liquids than with solids.  Patient was able to easily swallow barium tablet with liquid without deficits, with no aspiration or penetration..  Of note he did not cough or clear his throat during entire study while swallowing barium however prior to barium administration patient did clear his throat immediately post and water swallow.     Highly recommend patient follow-up with speech pathologist to establish exercise regimen to improve pharyngeal motility.  Recommend he masticates well, follow solids with liquids, maintain strength of cough, voice, and expectoration for airway  protection.  Also provided him information regarding a vacuum device for clearance of aspiration for emergent use.  Thank you for this consult.   PATIENT REPORTED OUTCOME MEASURES (PROM): Question Patient's Response  My swallowing problem has caused me to lose weight 0  2.  My swallowing problem interferes with my ability to go out to meals 0  3.  Swallowing liquids takes extra effort 0  4.  Swallowing solids takes extra effort 2  5.  Swallowing pills takes extra effort 0  6.  Swallowing is painful 0  7.  The pleasure of eating is affected by my swallowing 1  8.  When I swallow food sticks in my throat 2  9.  I cough when I eat 3  10.  Swallowing is stressful  2    Communication Effectiveness Survey        How effective is your speech in... Pt Rating  Having a conversation with family/friends 2  2.   Conversing with strangers 2  3.   Talking to someone familiar on the phone 2.5  4.   Talking to someone unfamiliar on the phone 2.5  5.   Being part of conversation in a noisy environment  2.5  6.   Speaking to someone when angry or upset 3  7.   Conversing while travelling in the car 2.5  8.   Conversing across distance  3   TODAY'S TREATMENT:                                                                                                                                         DATE:   SO=Speak Out  01/23/23: Swallowing: Pt indicated he is performing safe swallow strategies with POs. SLP observed pt today with fig bar and water. Pt followed precautions with independence and without any overt s/sx oral or pharyngeal deficits.  Speech: Pt was more consistent with SO lessons since last session. On lesson 13 today, so SLP needs to explain pt does ONE lesson each day. SLP targeted improving vocal quality and increasing intensity through progressively difficulty speech tasks using Speak Out! program which works with intentionality. ST lead pt through exercises with averages this date:   M-words: 79dB with initial cues for connecting the words and for volume (initial mod cues) Sustained "ah" 88 dB with initial rare min cues for reduction in loudness Glides with average 83 dB with mod cues for holding low and high notes and then performing glides Reading (phrases) 84 dB Cognitive speech task 77 dB Conversational task: Pt req'd occasional cues for using intentional speech for maintaining average 70 dB.  01/20/23: SPEECH: Pt indicated the last lesson he completed was Lesson 6. So SLP assisted pt in completing Lesson 7. SLP asked pt what was th ebarrier to consitency with SP and pt stated he needed to have a specific time each day to complete. SLP worked with pt to find these times and stated that he could begin his first lesson of the day between 10-11AM, and his second lesson between 5-6PM. If this does not work well for pt SLP and pt agreed to shelf SO and do traditional ST for loudness/clarity.  M-words: average 85dB,  Sustained "ah" average 90 dB  Glides - occasional mod faded to rare min cues for hold pitch at bottom and top, average 83 dB Numbers - rare min cues for "swinging" numbers instead of separate voiced productions- average 84 dB Reading (phrases) 82 dB Conversation task role playing ordering apple pie- average of 72 dB  Conversation going to and from ST room was 100% intelligible however was with average upper 60s dB SWALLOW: Pt completed his swallow HEP with SBA.  01/15/23: Given "S", if pt consistency with SO does not improve SLP may have to abandon the program and modify plan for using traditional dysarthria therapy. SLP told pt he should complete lessons on his own at home, and he and SLP should be doing lesson 10 on Monday when he arrives for ST.  SPEECH: SLP worked with patient with SO  lesson 5. Some tasks were modified for reduced breath support.  M-words: average 86dB, with occasional min cue to slow production and decr volume Sustained "ah" average 90 dB with  initial cue to decr volume and open mouth Glides - usual max faded to occasional mod-max cues for hold pitch at bottom and top, average 82 dB Numbers - initial min cues for "swinging" numbers instead of separate voiced productions- average 85 dB Reading (phrases) 84 dB Conversation task role playing MD office- again, average of 72 dB  Conversation going to and from ST room was 100% intelligible.   01/06/23: SPEECH: Given "s", SLP reiterated daily practice of SO lesson x2, or once with daily lesson and once with online practice. SLP repeated this x3 during today's session. Pt told SLP he would complete online session today, and cont with subsequent lesson + online practice. SLP targeted improving vocal quality and increasing intensity through progressively difficulty speech tasks using Speak Out! program, lesson 3. ST lead pt through exercises providing occasional model with min-mod-A required to achieve target dB this date. Averages this date:  M-words: 88dB, with initial cue to slow production and decr volume Sustained "ah" 87 dB initial cue to decr volume Glides - usual max faded to occasional mod-max cues for hold pitch at bottom and top, average 83 dB Numbers - mod-max initial cues for "swinging" numbers instead of separate production- average 85 dB Reading (phrases) 82 dB Conversation task - average 72 dB Speech between tasks was sub-WNL. This improved after the reading and conversation tasks.   01/01/23: SWALLOW: pt demonstrates 5/5 swallow exercises with independence, reports to daily implementation. Reita Cliche continues to use tally marks to ensure completion of all exercises.  SPEECH: SLP inquired about pt's consistency with SO, pt acknowledged he has not been consistent. SLP educated pt that if he is finding it difficult to complete daily that he needs to take a break and return to ST when he can complete daily. SLP reiterated daily practice lesson x2, or once with daily lesson and once with  online practice. Showed pt webpage with online practice for him to navigate to on his own if desired. Pt told SLP he would complete today and subsequent days x2 as directed.  SLP targeted improving vocal quality and increasing intensity through progressively difficulty speech tasks using Speak Out! program, lesson 2. ST lead pt through exercises providing occasional model with min-mod-A required to achieve target dB this date. Averages this date:  M-words: 85dB Sustained "ah" 88 dB Reading (phrases) 82 dB Cognitive speech task 78 dB Conversational task: Conversational sample of approx 3 minutes, pt averaged 71 dB with occasional min A.  Benefiting from occasional min -mod A for improved breath support during warm up tasks, though some tasks were modified to accommodate decreased breath support. Goal is to complete warm up exercises of speak out daily before next session.   12/23/22: SWALLOW: pt demonstrates 5/5 swallow exercises with mod-I, reports to daily implementation. Using tally marks to ensure completion of all exercises.  SPEECH: SLP re-introduced pt to speak out, explained intentional speech and it's role  Target improving vocal quality and increasing intensity through progressively difficulty speech tasks using Speak Out! program, lesson 1. ST leads pt through exercises providing usual model prior to pt execution. usual mod-A required to achieve target dB this date. Averages this date:  Sustained "ah" 91 dB Reading (phrases) 80 dB Cognitive speech task 78 dB Conversational task:  Benefiting from tactile cues for improved breath support  during warm up tasks, though  some tasks were modified to accommodate cognitive load and decreased breath support. Conversational sample of approx 5 minutes, pt averages 71 dB with occasional min-A. Goal is to complete warm up exercises of speak out daily between this and next session.    12/18/22: SWALLOW: reviewed pt's dysphagia HEP. SLP had to re-educate  pt about procedure for [facility]. SLP taught pt the alternate to [place] (CTAR - "chin pushback" on pt's sheet) in previous session due to pt's cervical/back issues.  SPEECH: SLP ordered pt Speak Out book today. Introduced pt to [employer] everyday tasks May-me-my-moe-moo, ah, and glide. Pt with difficulty with breath support due to other dx's. Cue to "Be intentional with your air" was helpful but pt had to rush May-Moo, and with glide req'd cues for starting hi -lo glide at roughly the same Hz he ended on with lo-hi glide. Pt was independent by session end. Told pt to complete these tasks BID in prep for start of Speak Out.  12/16/22 (eval): SLP reviewed results from [facility] and provided precautions and HEP.   PATIENT EDUCATION: Education details: See "pt instructions" and "today's treatment". Person educated: Patient Education method: one or more of: Explanation, Demonstration, Verbal cues, and Handouts Education comprehension: One or more of : verbalized understanding, returned demonstration, verbal cues required, and needs further education   GOALS: Goals reviewed with patient? Yes  SHORT TERM GOALS: Target date: 01/24/23 (modified due to visit number)  Pt will complete dysphagia HEP with modified independence x2 sessions Baseline: 01/01/23 Goal status: MET  2.  Pt will perform thorough mastication and alternate bite/sip precautions with POs during session with modified independence in 2 sessions Baseline: 01/23/23 Goal status: Partially met  3.  Pt will complete vocal warm up tasks (ah, glides, counting, etc) with rare min A in 2 sessions Baseline: 01/20/23 Goal status: Partially met  4.  Reita Cliche will improve speech volume in 5 minutes simple conversation to low 70s dB in 3 sessions Baseline:  Goal status: Not met  5.  Pt will demonstrate completion of home exercises as prescribed 90% compliance in first 2 weeks Baseline:  Goal status: NOT MET   LONG TERM GOALS: Target date:  02/14/23  Pt will complete dysphagia HEP with independence x3 sessions Baseline:  Goal status: INITIAL  2.  Pt will perform thorough mastication and alternate bite/sip precautions with POs during session with independence in 3 sessions Baseline:  Goal status: INITIAL  3.  Pt will complete vocal warm up tasks (ah, glides, counting, etc) with modified independence in 3 sessions Baseline:  Goal status: INITIAL  4.  Reita Cliche will improve speech volume in 10 minutes simple-mod complex conversation to low 70s dB in 3 sessions Baseline:  Goal status: INITIAL  5.  In last 1-2 sessions, both of Bobby's PROM scores will improve compared to initial administration Baseline:  Goal status: INITIAL  6.  Reita Cliche will tell SLP what HEP to perform in order to maintain WNL volume speech over time Baseline:  Goal status: INITIAL  ASSESSMENT:  CLINICAL IMPRESSION: Patient is a 72 y.o. M who was seen today for treatment of voice (reduced voice volume), and review MBS study and initiate HEP for dysphagia. See "today's treatment" for details of today's session. Consistency of SO was better this week.   OBJECTIVE IMPAIRMENTS: Objective impairments include dysarthria and dysphagia. These impairments are limiting patient from household responsibilities, ADLs/IADLs, effectively communicating at home and in community, and safety when swallowing.Factors affecting potential  to achieve goals and functional outcome are  none noted .Marland Kitchen Patient will benefit from skilled SLP services to address above impairments and improve overall function.  REHAB POTENTIAL: Good  PLAN:  SLP FREQUENCY: 2x/week  SLP DURATION: 8 weeks  PLANNED INTERVENTIONS: Aspiration precaution training, Pharyngeal strengthening exercises, Diet toleration management , Environmental controls, Internal/external aids, Oral motor exercises, Functional tasks, SLP instruction and feedback, Compensatory strategies, and Patient/family  education    [facility], CCC-SLP 01/23/2023, 2:54 PM

## 2023-01-27 ENCOUNTER — Ambulatory Visit: Payer: Medicare Other

## 2023-01-27 DIAGNOSIS — R471 Dysarthria and anarthria: Secondary | ICD-10-CM

## 2023-01-27 DIAGNOSIS — R1312 Dysphagia, oropharyngeal phase: Secondary | ICD-10-CM

## 2023-01-27 DIAGNOSIS — R2681 Unsteadiness on feet: Secondary | ICD-10-CM | POA: Diagnosis not present

## 2023-01-27 NOTE — Therapy (Signed)
OUTPATIENT SPEECH LANGUAGE PATHOLOGY PARKINSON'S TREATMENT   Patient Name: Jim Carson MRN: [id number] DOB:October 24, 1950, 72 y.o., male Today's Date: 01/27/2023  PCP: Malka So., MD REFERRING PROVIDER: Carrie Mew, MD  END OF SESSION:  End of Session - 01/27/23 0857     Visit Number 9    Number of Visits 17    Date for SLP Re-Evaluation 02/14/23    SLP Start Time 0855    SLP Stop Time  0930    SLP Time Calculation (min) 35 min    Activity Tolerance Patient tolerated treatment well               Past Medical History:  Diagnosis Date   Abnormal gait    due to parkinson's,  uses cane   Allergic rhinitis    Allergy Spring 1998   Allergic to dustmits, dog hair and roaches.   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 Lorin Picket  @[facility]   Pneumonia    Sleep apnea    Vitamin B12 deficiency    Vitamin D deficiency    Wears glasses    Past Surgical History:  Procedure Laterality Date   APPENDECTOMY  07/1964   JOINT REPLACEMENT  March 2019 and again on 09/10/2021   Left hip twice.  2nd time due to 3 dislocations   LAPAROSCOPIC CHOLECYSTECTOMY  01-21-2017   @[facility] , [place]   POSTERIOR FUSION CERVICAL SPINE  08-15-2011   @[facility] , [place]   w/ laminectomy and decompression-- C3 -- T1   POSTERIOR LAMINECTOMY / DECOMPRESSION LUMBAR SPINE  07-30-2013   @[facility] , [place]   bilateral T11 - 12,  L2 --5   REMOVAL LEFT T1 PARASPINOUS MASS  02-12-2016   @[facility] ,  [place]   desmoid fibromatosis    SHOULDER ARTHROSCOPY WITH ROTATOR CUFF REPAIR Left 12/15/2018   Procedure: SHOULDER ARTHROSCOPY, biceps tenodesis labored Debridement, submacromial decompression;  Surgeon: Eugenia Mcalpine, MD;  Location: [facility];  Service: Orthopedics;  Laterality: Left;  interscalene block   SPINE SURGERY     TOTAL HIP ARTHROPLASTY Left 09/18/2017   @[facility] , [place]   TOTAL HIP REVISION Left 09/10/2021   Procedure: Left hip acetabular versus total hip arthroplasty revision, constrained liner;  Surgeon: Ollen Gross, MD;  Location: [facility];  Service: Orthopedics;  Laterality: Left;   Patient Active Problem List   Diagnosis Date Noted   Drug induced constipation 12/30/2022   Acquired hypothyroidism 12/30/2022   Primary osteoarthritis of left hip 05/18/2021   PD (Parkinson's disease) 05/18/2021   History of gastroesophageal reflux (GERD) 05/18/2021   OSA (obstructive sleep apnea) 05/18/2021   Essential hypertension 05/18/2021    ONSET DATE: 2013 (script dated 11/22/22)  REFERRING DIAG:  G20.A1 (ICD-10-CM) - Parkinson's disease    THERAPY DIAG:  Dysarthria and anarthria  Dysphagia, oropharyngeal phase  Rationale for Evaluation and Treatment: Rehabilitation  SUBJECTIVE:   SUBJECTIVE STATEMENT:  Pt reports he had  difficulty with consistency the last two days with SO. Pt accompanied by: self  PERTINENT HISTORY: Patient has past medical history significant for Parkinson's disease diagnosed in 2013 symptomatic starting 2012, gait disturbance, dysphagia, allergic rhinitis, GERD, OSA on CPAP. Prior surgical history includes cervical spine fusion posterior 2013 and patient denies dysphagia following, left hip revision 2020, posterior laminectomy/decompression of lumbar spine, removal left T1 paraspinous mass. Pt had ST course in 2021 which was successful in improving speech loudness, and participation in swallowing exercises.  PAIN:  Are you having pain? No  FALLS: Has patient fallen  in last 6 months?  See PT evaluation for details  PATIENT GOALS: Improve loudness and swallowing  OBJECTIVE:   DIAGNOSTIC FINDINGS:  FINDINGS FROM MBS 12/11/22: (MODIFIED WITH BOLD PRINT) Clinical Impression: Patient swallow function is consistent with prior modified barium swallow study.  Mild oropharyngeal phase dysphagia continues due to patient's Parkinson's.  Minimal mastication of solids noted despite saying he felt like he chewed well.  Pharyngeal timing of swallow actually appeared improved with swallowing trigger at tongue base, vallecula with pures and piriform sinus with liquids.  Retention mostly vallecular and on the superior surface of epiglottis present due to diminished tongue base retraction and wall motility.  Patient senses retention at the piriform sinus when it is indeed located at the vallecular space. Liquid swallows are most effective to diminish amount of pudding and cracker bolus retained.  Various postures including chin, head turn right, head turn left did not appear to significantly improved pharyngeal clearance.  If any posture was helpful perhaps head turn left may have been slightly helpful.  Cueing patient to expectorate (cough) was noted to clear vallecular retention that he did want clear with dry swallows.     Motility adequacy of musculature varied during the study with epiglottic deflection better with liquids than with solids.  Patient was able to easily swallow barium tablet with liquid without deficits, with no aspiration or penetration..  Of note he did not cough or clear his throat during entire study while swallowing barium however prior to barium administration patient did clear his throat immediately post and water swallow.     Highly recommend patient follow-up with speech pathologist to establish exercise regimen to improve pharyngeal motility.  Recommend he masticates well, follow solids with liquids, maintain strength of cough, voice, and expectoration  for airway protection.  Also provided him information regarding a vacuum device for clearance of aspiration for emergent use.  Thank you for this consult.   PATIENT REPORTED OUTCOME MEASURES (PROM): Question Patient's Response  My swallowing problem has caused me to lose weight 0  2.  My swallowing problem interferes with my ability to go out to meals 0  3.  Swallowing liquids takes extra effort 0  4.  Swallowing solids takes extra effort 2  5.  Swallowing pills takes extra effort 0  6.  Swallowing is painful 0  7.  The pleasure of eating is affected by my swallowing 1  8.  When I swallow food sticks in my throat 2  9.  I cough when I eat 3  10.  Swallowing is stressful  2    Communication Effectiveness Survey        How effective is your speech in... Pt Rating  Having a conversation with family/friends 2  2.   Conversing with strangers 2  3.   Talking to someone familiar on the phone 2.5  4.   Talking to someone unfamiliar on the  phone 2.5  5.   Being part of conversation in a noisy environment  2.5  6.   Speaking to someone when angry or upset 3  7.   Conversing while travelling in the car 2.5  8.   Conversing across distance  3   TODAY'S TREATMENT:                                                                                                                                         DATE:   SO=Speak Out  01/27/23: SLP explained to pt that ONE lesson per day, BID, (with second time optional for online session) is the aim with SO. Pt acknowledged understanding. As in "S" pt endorsed difficulty with consistency of SO over the weekend - SLP suggested his second time/day completing SO should be with online due to necessary occasional min-mod cues for glides.  Lesson 17 today with pt. SLP targeted improving vocal quality and increasing intensity through progressively difficult speech tasks using Speak Out! program which works with intentionality. SLP lead pt through exercises with  averages this date:  M-words: 83dB with initial cues for connecting the words and for volume (initial min cues faded to independent) Sustained "ah" 85 dB with initial min cue (and then independent) for reduction in loudness Glides with average 83 dB with occasional mod cues for holding low and high notes and then performing glides Reading (phrases) 83 dB Cognitive speech task 74dB Conversational task: Pt did very well with conversational task today role-playing changing an ST appointment - average 72 dB.  01/23/23: Swallowing: Pt indicated he is performing safe swallow strategies with POs. SLP observed pt today with fig bar and water. Pt followed precautions with independence and without any overt s/sx oral or pharyngeal deficits.  Speech: Pt was more consistent with SO lessons since last session. On lesson 13 today, so SLP needs to explain pt does ONE lesson each day. SLP targeted improving vocal quality and increasing intensity through progressively difficulty speech tasks using Speak Out! program which works with intentionality. ST lead pt through exercises with averages this date:  M-words: 79dB with initial cues for connecting the words and for volume (initial mod cues) Sustained "ah" 88 dB with initial rare min cues for reduction in loudness Glides with average 83 dB with mod cues for holding low and high notes and then performing glides Reading (phrases) 84 dB Cognitive speech task 77 dB Conversational task: Pt req'd occasional cues for using intentional speech for maintaining average 70 dB.  01/20/23: SPEECH: Pt indicated the last lesson he completed was Lesson 6. So SLP assisted pt in completing Lesson 7. SLP asked pt what was th ebarrier to consitency with SP and pt stated he needed to have a specific time each day to complete. SLP worked with pt to find these times and stated that he could begin his first lesson of the day  between 10-11AM, and his second lesson between 5-6PM. If this does  not work well for pt SLP and pt agreed to shelf SO and do traditional ST for loudness/clarity.  M-words: average 85dB,  Sustained "ah" average 90 dB  Glides - occasional mod faded to rare min cues for hold pitch at bottom and top, average 83 dB Numbers - rare min cues for "swinging" numbers instead of separate voiced productions- average 84 dB Reading (phrases) 82 dB Conversation task role playing ordering apple pie- average of 72 dB  Conversation going to and from ST room was 100% intelligible however was with average upper 60s dB SWALLOW: Pt completed his swallow HEP with SBA.  01/15/23: Given "S", if pt consistency with SO does not improve SLP may have to abandon the program and modify plan for using traditional dysarthria therapy. SLP told pt he should complete lessons on his own at home, and he and SLP should be doing lesson 10 on Monday when he arrives for ST.  SPEECH: SLP worked with patient with SO lesson 5. Some tasks were modified for reduced breath support.  M-words: average 86dB, with occasional min cue to slow production and decr volume Sustained "ah" average 90 dB with initial cue to decr volume and open mouth Glides - usual max faded to occasional mod-max cues for hold pitch at bottom and top, average 82 dB Numbers - initial min cues for "swinging" numbers instead of separate voiced productions- average 85 dB Reading (phrases) 84 dB Conversation task role playing MD office- again, average of 72 dB  Conversation going to and from ST room was 100% intelligible.   01/06/23: SPEECH: Given "s", SLP reiterated daily practice of SO lesson x2, or once with daily lesson and once with online practice. SLP repeated this x3 during today's session. Pt told SLP he would complete online session today, and cont with subsequent lesson + online practice. SLP targeted improving vocal quality and increasing intensity through progressively difficulty speech tasks using Speak Out! program, lesson 3. ST  lead pt through exercises providing occasional model with min-mod-A required to achieve target dB this date. Averages this date:  M-words: 88dB, with initial cue to slow production and decr volume Sustained "ah" 87 dB initial cue to decr volume Glides - usual max faded to occasional mod-max cues for hold pitch at bottom and top, average 83 dB Numbers - mod-max initial cues for "swinging" numbers instead of separate production- average 85 dB Reading (phrases) 82 dB Conversation task - average 72 dB Speech between tasks was sub-WNL. This improved after the reading and conversation tasks.   01/01/23: SWALLOW: pt demonstrates 5/5 swallow exercises with independence, reports to daily implementation. Jim Carson continues to use tally marks to ensure completion of all exercises.  SPEECH: SLP inquired about pt's consistency with SO, pt acknowledged he has not been consistent. SLP educated pt that if he is finding it difficult to complete daily that he needs to take a break and return to ST when he can complete daily. SLP reiterated daily practice lesson x2, or once with daily lesson and once with online practice. Showed pt webpage with online practice for him to navigate to on his own if desired. Pt told SLP he would complete today and subsequent days x2 as directed.  SLP targeted improving vocal quality and increasing intensity through progressively difficulty speech tasks using Speak Out! program, lesson 2. ST lead pt through exercises providing occasional model with min-mod-A required to achieve target dB this date. Averages  this date:  M-words: 85dB Sustained "ah" 88 dB Reading (phrases) 82 dB Cognitive speech task 78 dB Conversational task: Conversational sample of approx 3 minutes, pt averaged 71 dB with occasional min A.  Benefiting from occasional min -mod A for improved breath support during warm up tasks, though some tasks were modified to accommodate decreased breath support. Goal is to complete warm up  exercises of speak out daily before next session.   12/23/22: SWALLOW: pt demonstrates 5/5 swallow exercises with mod-I, reports to daily implementation. Using tally marks to ensure completion of all exercises.  SPEECH: SLP re-introduced pt to speak out, explained intentional speech and it's role  Target improving vocal quality and increasing intensity through progressively difficulty speech tasks using Speak Out! program, lesson 1. ST leads pt through exercises providing usual model prior to pt execution. usual mod-A required to achieve target dB this date. Averages this date:  Sustained "ah" 91 dB Reading (phrases) 80 dB Cognitive speech task 78 dB Conversational task:  Benefiting from tactile cues for improved breath support during warm up tasks, though  some tasks were modified to accommodate cognitive load and decreased breath support. Conversational sample of approx 5 minutes, pt averages 71 dB with occasional min-A. Goal is to complete warm up exercises of speak out daily between this and next session.    12/18/22: SWALLOW: reviewed pt's dysphagia HEP. SLP had to re-educate pt about procedure for [facility]. SLP taught pt the alternate to [place] (CTAR - "chin pushback" on pt's sheet) in previous session due to pt's cervical/back issues.  SPEECH: SLP ordered pt Speak Out book today. Introduced pt to [employer] everyday tasks May-me-my-moe-moo, ah, and glide. Pt with difficulty with breath support due to other dx's. Cue to "Be intentional with your air" was helpful but pt had to rush May-Moo, and with glide req'd cues for starting hi -lo glide at roughly the same Hz he ended on with lo-hi glide. Pt was independent by session end. Told pt to complete these tasks BID in prep for start of Speak Out.  12/16/22 (eval): SLP reviewed results from [facility] and provided precautions and HEP.   PATIENT EDUCATION: Education details: See "pt instructions" and "today's treatment". Person educated:  Patient Education method: one or more of: Explanation, Demonstration, Verbal cues, and Handouts Education comprehension: One or more of : verbalized understanding, returned demonstration, verbal cues required, and needs further education   GOALS: Goals reviewed with patient? Yes  SHORT TERM GOALS: Target date: 01/24/23 (modified due to visit number)  Pt will complete dysphagia HEP with modified independence x2 sessions Baseline: 01/01/23 Goal status: MET  2.  Pt will perform thorough mastication and alternate bite/sip precautions with POs during session with modified independence in 2 sessions Baseline: 01/23/23 Goal status: Partially met  3.  Pt will complete vocal warm up tasks (ah, glides, counting, etc) with rare min A in 2 sessions Baseline: 01/20/23 Goal status: Partially met  4.  Jim Carson will improve speech volume in 5 minutes simple conversation to low 70s dB in 3 sessions Baseline:  Goal status: Not met  5.  Pt will demonstrate completion of home exercises as prescribed 90% compliance in first 2 weeks Baseline:  Goal status: NOT MET   LONG TERM GOALS: Target date: 02/14/23  Pt will complete dysphagia HEP with independence x3 sessions Baseline:  Goal status: INITIAL  2.  Pt will perform thorough mastication and alternate bite/sip precautions with POs during session with independence in 3 sessions Baseline:  Goal  status: INITIAL  3.  Pt will complete vocal warm up tasks (ah, glides, counting, etc) with modified independence in 3 sessions Baseline:  Goal status: INITIAL  4.  Jim Carson will improve speech volume in 10 minutes simple-mod complex conversation to low 70s dB in 3 sessions Baseline:  Goal status: INITIAL  5.  In last 1-2 sessions, both of Bobby's PROM scores will improve compared to initial administration Baseline:  Goal status: INITIAL  6.  Jim Carson will tell SLP what HEP to perform in order to maintain WNL volume speech over time Baseline:  Goal status:  INITIAL  ASSESSMENT:  CLINICAL IMPRESSION: Patient is a 72 y.o. M who was seen today for treatment of voice (reduced voice volume), and review MBS study and initiate HEP for dysphagia. Jim Carson endorses he has not been consistent with SO since last session due to visitors at his home. See "today's treatment" for details of today's session.   OBJECTIVE IMPAIRMENTS: Objective impairments include dysarthria and dysphagia. These impairments are limiting patient from household responsibilities, ADLs/IADLs, effectively communicating at home and in community, and safety when swallowing.Factors affecting potential to achieve goals and functional outcome are  none noted .Marland Kitchen Patient will benefit from skilled SLP services to address above impairments and improve overall function.  REHAB POTENTIAL: Good  PLAN:  SLP FREQUENCY: 2x/week  SLP DURATION: 8 weeks  PLANNED INTERVENTIONS: Aspiration precaution training, Pharyngeal strengthening exercises, Diet toleration management , Environmental controls, Internal/external aids, Oral motor exercises, Functional tasks, SLP instruction and feedback, Compensatory strategies, and Patient/family education    [facility], CCC-SLP 01/27/2023, 8:57 AM

## 2023-01-29 ENCOUNTER — Ambulatory Visit: Payer: Medicare Other

## 2023-01-29 DIAGNOSIS — R2681 Unsteadiness on feet: Secondary | ICD-10-CM

## 2023-01-29 DIAGNOSIS — M6281 Muscle weakness (generalized): Secondary | ICD-10-CM

## 2023-01-29 DIAGNOSIS — R29818 Other symptoms and signs involving the nervous system: Secondary | ICD-10-CM

## 2023-01-29 DIAGNOSIS — R2689 Other abnormalities of gait and mobility: Secondary | ICD-10-CM

## 2023-01-29 DIAGNOSIS — R471 Dysarthria and anarthria: Secondary | ICD-10-CM

## 2023-01-29 DIAGNOSIS — R262 Difficulty in walking, not elsewhere classified: Secondary | ICD-10-CM

## 2023-01-29 DIAGNOSIS — R1312 Dysphagia, oropharyngeal phase: Secondary | ICD-10-CM

## 2023-01-29 NOTE — Therapy (Signed)
OUTPATIENT SPEECH LANGUAGE PATHOLOGY PARKINSON'S TREATMENT/PROGRESS NOTE   Patient Name: Jim Carson MRN: [id number] DOB:12-08-1950, 72 y.o., male Today's Date: 01/29/2023  PCP: Malka So., MD REFERRING PROVIDER: Carrie Mew, MD  END OF SESSION:  End of Session - 01/29/23 0916     Visit Number 10    Number of Visits 17    Date for SLP Re-Evaluation 02/14/23    SLP Start Time 0850    SLP Stop Time  0930    SLP Time Calculation (min) 40 min    Activity Tolerance Patient tolerated treatment well                Past Medical History:  Diagnosis Date   Abnormal gait    due to parkinson's,  uses cane   Allergic rhinitis    Allergy Spring 1998   Allergic to dustmits, dog hair and roaches.   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 Lorin Picket  @[facility]   Pneumonia    Sleep apnea    Vitamin B12 deficiency    Vitamin D deficiency    Wears glasses    Past Surgical History:  Procedure Laterality Date   APPENDECTOMY  07/1964   JOINT REPLACEMENT  March 2019 and again on 09/10/2021   Left hip twice.  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   @[facility] ,  [place]   desmoid  fibromatosis   SHOULDER ARTHROSCOPY WITH ROTATOR CUFF REPAIR Left 12/15/2018   Procedure: SHOULDER ARTHROSCOPY, biceps tenodesis labored Debridement, submacromial decompression;  Surgeon: Eugenia Mcalpine, MD;  [facility];  Service: Orthopedics;  Laterality: Left;  interscalene block   SPINE SURGERY     TOTAL HIP ARTHROPLASTY Left 09/18/2017   @[facility] , [place]   TOTAL HIP REVISION Left 09/10/2021   Procedure: Left hip acetabular versus total hip arthroplasty revision, constrained liner;  Surgeon: Ollen Gross, MD;  Location: [facility];  Service: Orthopedics;  Laterality: Left;   Patient Active Problem List   Diagnosis Date Noted   Drug induced constipation 12/30/2022   Acquired hypothyroidism 12/30/2022   Primary osteoarthritis of left hip 05/18/2021   PD (Parkinson's disease) 05/18/2021   History of gastroesophageal reflux (GERD) 05/18/2021   OSA (obstructive sleep apnea) 05/18/2021   Essential hypertension 05/18/2021  Speech Therapy Progress Note  Dates of Reporting Period: 12/16/22 to present  Subjective Statement: Pt has been seen for 10 visits of ST targeting speech intelligibility, and swallowing.  Objective: See below  Goal Update: See below.  Plan: See below  Reason Skilled Services  are Required: Pt has not met rehab potential at this time.   ONSET DATE: 2013 (script dated 11/22/22)  REFERRING DIAG:  G20.A1 (ICD-10-CM) - Parkinson's disease    THERAPY DIAG:  No diagnosis found.  Rationale for Evaluation and Treatment: Rehabilitation  SUBJECTIVE:   SUBJECTIVE STATEMENT:  "Feeling a little soreness in my lt hip today." Pt accompanied by: self  PERTINENT HISTORY: Patient has past medical history significant for Parkinson's disease diagnosed in 2013 symptomatic starting 2012, gait disturbance, dysphagia, allergic rhinitis, GERD, OSA on CPAP. Prior surgical history includes cervical spine fusion posterior 2013 and patient denies dysphagia  following, left hip revision 2020, posterior laminectomy/decompression of lumbar spine, removal left T1 paraspinous mass. Pt had ST course in 2021 which was successful in improving speech loudness, and participation in swallowing exercises.  PAIN:  Are you having pain? Yes: NPRS scale: 5/10 Pain location: lt hip Pain description: sore Aggravating factors: lifting weights Relieving factors: IDK, maybe stretch it  FALLS: Has patient fallen in last 6 months?  See PT evaluation for details  PATIENT GOALS: Improve loudness and swallowing  OBJECTIVE:   DIAGNOSTIC FINDINGS:  FINDINGS FROM MBS 12/11/22: (MODIFIED WITH BOLD PRINT) Clinical Impression: Patient swallow function is consistent with prior modified barium swallow study.  Mild oropharyngeal phase dysphagia continues due to patient's Parkinson's.  Minimal mastication of solids noted despite saying he felt like he chewed well.  Pharyngeal timing of swallow actually appeared improved with swallowing trigger at tongue base, vallecula with pures and piriform sinus with liquids.  Retention mostly vallecular and on the superior surface of epiglottis present due to diminished tongue base retraction and wall motility.  Patient senses retention at the piriform sinus when it is indeed located at the vallecular space. Liquid swallows are most effective to diminish amount of pudding and cracker bolus retained.  Various postures including chin, head turn right, head turn left did not appear to significantly improved pharyngeal clearance.  If any posture was helpful perhaps head turn left may have been slightly helpful.  Cueing patient to expectorate (cough) was noted to clear vallecular retention that he did want clear with dry swallows.     Motility adequacy of musculature varied during the study with epiglottic deflection better with liquids than with solids.  Patient was able to easily swallow barium tablet with liquid without deficits, with no aspiration  or penetration..  Of note he did not cough or clear his throat during entire study while swallowing barium however prior to barium administration patient did clear his throat immediately post and water swallow.     Highly recommend patient follow-up with speech pathologist to establish exercise regimen to improve pharyngeal motility.  Recommend he masticates well, follow solids with liquids, maintain strength of cough, voice, and expectoration for airway protection.  Also provided him information regarding a vacuum device for clearance of aspiration for emergent use.  Thank you for this consult.   PATIENT REPORTED OUTCOME MEASURES (PROM): Question Patient's Response  My swallowing problem has caused me to lose weight 0  2.  My swallowing problem interferes with my ability to go out to meals 0  3.  Swallowing liquids takes extra effort 0  4.  Swallowing solids takes extra effort 2  5.  Swallowing pills takes extra effort 0  6.  Swallowing is painful 0  7.  The pleasure of eating is affected by my swallowing 1  8.  When I swallow food sticks in my throat 2  9.  I  cough when I eat 3  10.  Swallowing is stressful  2    Communication Effectiveness Survey        How effective is your speech in... Pt Rating  Having a conversation with family/friends 2  2.   Conversing with strangers 2  3.   Talking to someone familiar on the phone 2.5  4.   Talking to someone unfamiliar on the phone 2.5  5.   Being part of conversation in a noisy environment  2.5  6.   Speaking to someone when angry or upset 3  7.   Conversing while travelling in the car 2.5  8.   Conversing across distance  3   TODAY'S TREATMENT:                                                                                                                                         DATE:   SO=Speak Out  01/29/23: Pt  has been consistent with SO since last session. Lesson 19 today with pt. SLP targeted improving vocal quality and increasing  intensity through progressively difficult speech tasks using Speak Out! program which works with intentionality. SLP lead pt through exercises with the following averages this date:  M-words: 85 dB with initial cues for volume and slowing productions down (initial min cues faded to independent) Sustained "ah" 88 dB with initial min cue (and then independent) for reduction in loudness Glides with average 83 dB with min cues for holding low and high notes and then performing glides Reading (paragraphs) 84 dB with usual min-mod cues for intent Cognitive speech task 74dB Conversational task: Pt again did very well with conversational task today - average 72 dB with WNL speech rate.  01/27/23: SLP explained to pt that ONE lesson per day, BID, (with second time optional for online session) is the aim with SO. Pt acknowledged understanding. As in "S" pt endorsed difficulty with consistency of SO over the weekend - SLP suggested his second time/day completing SO should be with online due to necessary occasional min-mod cues for glides.  Lesson 17 today with pt. SLP targeted improving vocal quality and increasing intensity through progressively difficult speech tasks using Speak Out! program which works with intentionality. SLP lead pt through exercises with averages this date:  M-words: 83dB with initial cues for connecting the words and for volume (initial min cues faded to independent) Sustained "ah" 85 dB with initial min cue (and then independent) for reduction in loudness Glides with average 83 dB with occasional mod cues for holding low and high notes and then performing glides Reading (phrases) 83 dB Cognitive speech task 74dB Conversational task: Pt did very well with conversational task today role-playing changing an ST appointment - average 72 dB.  01/23/23: Swallowing: Pt indicated he is performing safe swallow strategies with POs. SLP observed pt today with fig bar and water. Pt followed  precautions with independence and without any overt s/sx oral or pharyngeal deficits.  Speech: Pt was more consistent with SO lessons since last session. On lesson 13 today, so SLP needs to explain pt does ONE lesson each day. SLP targeted improving vocal quality and increasing intensity through progressively difficulty speech tasks using Speak Out! program which works with intentionality. ST lead pt through exercises with averages this date:  M-words: 79dB with initial cues for connecting the words and for volume (initial mod cues) Sustained "ah" 88 dB with initial rare min cues for reduction in loudness Glides with average 83 dB with mod cues for holding low and high notes and then performing glides Reading (phrases) 84 dB Cognitive speech task 77 dB Conversational task: Pt req'd occasional cues for using intentional speech for maintaining average 70 dB.  01/20/23: SPEECH: Pt indicated the last lesson he completed was Lesson 6. So SLP assisted pt in completing Lesson 7. SLP asked pt what was th ebarrier to consitency with SP and pt stated he needed to have a specific time each day to complete. SLP worked with pt to find these times and stated that he could begin his first lesson of the day between 10-11AM, and his second lesson between 5-6PM. If this does not work well for pt SLP and pt agreed to shelf SO and do traditional ST for loudness/clarity.  M-words: average 85dB,  Sustained "ah" average 90 dB  Glides - occasional mod faded to rare min cues for hold pitch at bottom and top, average 83 dB Numbers - rare min cues for "swinging" numbers instead of separate voiced productions- average 84 dB Reading (phrases) 82 dB Conversation task role playing ordering apple pie- average of 72 dB  Conversation going to and from ST room was 100% intelligible however was with average upper 60s dB SWALLOW: Pt completed his swallow HEP with SBA.  01/15/23: Given "S", if pt consistency with SO does not improve SLP  may have to abandon the program and modify plan for using traditional dysarthria therapy. SLP told pt he should complete lessons on his own at home, and he and SLP should be doing lesson 10 on Monday when he arrives for ST.  SPEECH: SLP worked with patient with SO lesson 5. Some tasks were modified for reduced breath support.  M-words: average 86dB, with occasional min cue to slow production and decr volume Sustained "ah" average 90 dB with initial cue to decr volume and open mouth Glides - usual max faded to occasional mod-max cues for hold pitch at bottom and top, average 82 dB Numbers - initial min cues for "swinging" numbers instead of separate voiced productions- average 85 dB Reading (phrases) 84 dB Conversation task role playing MD office- again, average of 72 dB  Conversation going to and from ST room was 100% intelligible.   01/06/23: SPEECH: Given "s", SLP reiterated daily practice of SO lesson x2, or once with daily lesson and once with online practice. SLP repeated this x3 during today's session. Pt told SLP he would complete online session today, and cont with subsequent lesson + online practice. SLP targeted improving vocal quality and increasing intensity through progressively difficulty speech tasks using Speak Out! program, lesson 3. ST lead pt through exercises providing occasional model with min-mod-A required to achieve target dB this date. Averages this date:  M-words: 88dB, with initial cue to slow production and decr volume Sustained "ah" 87 dB initial cue to decr volume Glides - usual max faded to occasional  mod-max cues for hold pitch at bottom and top, average 83 dB Numbers - mod-max initial cues for "swinging" numbers instead of separate production- average 85 dB Reading (phrases) 82 dB Conversation task - average 72 dB Speech between tasks was sub-WNL. This improved after the reading and conversation tasks.   01/01/23: SWALLOW: pt demonstrates 5/5 swallow exercises with  independence, reports to daily implementation. Jim Carson continues to use tally marks to ensure completion of all exercises.  SPEECH: SLP inquired about pt's consistency with SO, pt acknowledged he has not been consistent. SLP educated pt that if he is finding it difficult to complete daily that he needs to take a break and return to ST when he can complete daily. SLP reiterated daily practice lesson x2, or once with daily lesson and once with online practice. Showed pt webpage with online practice for him to navigate to on his own if desired. Pt told SLP he would complete today and subsequent days x2 as directed.  SLP targeted improving vocal quality and increasing intensity through progressively difficulty speech tasks using Speak Out! program, lesson 2. ST lead pt through exercises providing occasional model with min-mod-A required to achieve target dB this date. Averages this date:  M-words: 85dB Sustained "ah" 88 dB Reading (phrases) 82 dB Cognitive speech task 78 dB Conversational task: Conversational sample of approx 3 minutes, pt averaged 71 dB with occasional min A.  Benefiting from occasional min -mod A for improved breath support during warm up tasks, though some tasks were modified to accommodate decreased breath support. Goal is to complete warm up exercises of speak out daily before next session.   12/23/22: SWALLOW: pt demonstrates 5/5 swallow exercises with mod-I, reports to daily implementation. Using tally marks to ensure completion of all exercises.  SPEECH: SLP re-introduced pt to speak out, explained intentional speech and it's role  Target improving vocal quality and increasing intensity through progressively difficulty speech tasks using Speak Out! program, lesson 1. ST leads pt through exercises providing usual model prior to pt execution. usual mod-A required to achieve target dB this date. Averages this date:  Sustained "ah" 91 dB Reading (phrases) 80 dB Cognitive speech task 78  dB Conversational task:  Benefiting from tactile cues for improved breath support during warm up tasks, though  some tasks were modified to accommodate cognitive load and decreased breath support. Conversational sample of approx 5 minutes, pt averages 71 dB with occasional min-A. Goal is to complete warm up exercises of speak out daily between this and next session.    12/18/22: SWALLOW: reviewed pt's dysphagia HEP. SLP had to re-educate pt about procedure for [facility]. SLP taught pt the alternate to [place] (CTAR - "chin pushback" on pt's sheet) in previous session due to pt's cervical/back issues.  SPEECH: SLP ordered pt Speak Out book today. Introduced pt to [employer] everyday tasks May-me-my-moe-moo, ah, and glide. Pt with difficulty with breath support due to other dx's. Cue to "Be intentional with your air" was helpful but pt had to rush May-Moo, and with glide req'd cues for starting hi -lo glide at roughly the same Hz he ended on with lo-hi glide. Pt was independent by session end. Told pt to complete these tasks BID in prep for start of Speak Out.  12/16/22 (eval): SLP reviewed results from [facility] and provided precautions and HEP.   PATIENT EDUCATION: Education details: See "pt instructions" and "today's treatment". Person educated: Patient Education method: one or more of: Explanation, Demonstration, Verbal cues, and Handouts Education  comprehension: One or more of : verbalized understanding, returned demonstration, verbal cues required, and needs further education   GOALS: Goals reviewed with patient? Yes  SHORT TERM GOALS: Target date: 01/24/23 (modified due to visit number)  Pt will complete dysphagia HEP with modified independence x2 sessions Baseline: 01/01/23 Goal status: MET  2.  Pt will perform thorough mastication and alternate bite/sip precautions with POs during session with modified independence in 2 sessions Baseline: 01/23/23 Goal status: Partially met  3.  Pt will  complete vocal warm up tasks (ah, glides, counting, etc) with rare min A in 2 sessions Baseline: 01/20/23 Goal status: Partially met  4.  Jim Carson will improve speech volume in 5 minutes simple conversation to low 70s dB in 3 sessions Baseline:  Goal status: Not met  5.  Pt will demonstrate completion of home exercises as prescribed 90% compliance in first 2 weeks Baseline:  Goal status: NOT MET   LONG TERM GOALS: Target date: 02/14/23  Pt will complete dysphagia HEP with independence x3 sessions Baseline:  Goal status: INITIAL  2.  Pt will perform thorough mastication and alternate bite/sip precautions with POs during session with independence in 3 sessions Baseline:  Goal status: INITIAL  3.  Pt will complete vocal warm up tasks (ah, glides, counting, etc) with modified independence in 3 sessions Baseline:  Goal status: INITIAL  4.  Jim Carson will improve speech volume in 10 minutes simple-mod complex conversation to low 70s dB in 3 sessions Baseline:  Goal status: INITIAL  5.  In last 1-2 sessions, both of Jim Carson's PROM scores will improve compared to initial administration Baseline:  Goal status: INITIAL  6.  Jim Carson will tell SLP what HEP to perform in order to maintain WNL volume speech over time Baseline:  Goal status: INITIAL  ASSESSMENT:  CLINICAL IMPRESSION: Patient is a 72 y.o. M who was seen again today for treatment of voice (reduced voice volume), and review MBS study and initiate HEP for dysphagia. Jim Carson endorses he has not been consistent with SO since last session due to visitors at his home. See "today's treatment" for details of today's session.   OBJECTIVE IMPAIRMENTS: Objective impairments include dysarthria and dysphagia. These impairments are limiting patient from household responsibilities, ADLs/IADLs, effectively communicating at home and in community, and safety when swallowing.Factors affecting potential to achieve goals and functional outcome are  none  noted .Marland Kitchen Patient will benefit from skilled SLP services to address above impairments and improve overall function.  REHAB POTENTIAL: Good  PLAN:  SLP FREQUENCY: 2x/week  SLP DURATION: 8 weeks  PLANNED INTERVENTIONS: Aspiration precaution training, Pharyngeal strengthening exercises, Diet toleration management , Environmental controls, Internal/external aids, Oral motor exercises, Functional tasks, SLP instruction and feedback, Compensatory strategies, and Patient/family education    [facility], CCC-SLP 01/29/2023, 9:16 AM

## 2023-01-29 NOTE — Therapy (Signed)
OUTPATIENT PHYSICAL THERAPY NEURO TREATMENT NOTE and D/C Summary   Patient Name: Jim Carson MRN: [id number] DOB:03/04/1951, 72 y.o., male Today's Date: 01/29/2023   PCP: Malka So, MD REFERRING PROVIDER:Moore, Samara Deist, MD   PHYSICAL THERAPY DISCHARGE SUMMARY  Visits from Start of Care: 12  Current functional level related to goals / functional outcomes: See outcome measures in "STG/LTG" sections below   Remaining deficits: Balance/righting reaction deficits. Difficulty with single limb stance   Education / Equipment: HEP and relevant community classes   Patient agrees to discharge. Patient goals were partially met. Patient is being discharged due to being pleased with the current functional level.   END OF SESSION:  PT End of Session - 01/29/23 0919     Visit Number 12    Number of Visits 12    Date for PT Re-Evaluation 02/04/23    Authorization Type Medicare/AARP    PT Start Time 0930    PT Stop Time 1015    PT Time Calculation (min) 45 min    Equipment Utilized During Treatment Gait belt    Activity Tolerance Patient tolerated treatment well    Behavior During Therapy WFL for tasks assessed/performed                Past Medical History:  Diagnosis Date   Abnormal gait    due to parkinson's,  uses cane   Allergic rhinitis    Allergy Spring 1998   Allergic to dustmits, dog hair and roaches.   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 Lorin Picket  @[facility]   Pneumonia     Sleep apnea    Vitamin B12 deficiency    Vitamin D deficiency    Wears glasses    Past Surgical History:  Procedure Laterality Date   APPENDECTOMY  07/1964   JOINT REPLACEMENT  March 2019 and again on 09/10/2021   Left hip twice.  2nd time due to 3 dislocations   LAPAROSCOPIC CHOLECYSTECTOMY  01-21-2017   @[facility] , [place]   POSTERIOR FUSION CERVICAL SPINE  08-15-2011   @[facility] , [place]   w/ laminectomy and decompression-- C3 -- T1   POSTERIOR LAMINECTOMY / DECOMPRESSION LUMBAR SPINE  07-30-2013   @[facility] , [place]   bilateral T11 - 12,  L2 --5   REMOVAL LEFT T1 PARASPINOUS MASS  02-12-2016   @[facility] ,  [place]   desmoid fibromatosis   SHOULDER ARTHROSCOPY WITH ROTATOR CUFF REPAIR Left 12/15/2018   Procedure: SHOULDER ARTHROSCOPY, biceps tenodesis labored Debridement, submacromial decompression;  Surgeon: Eugenia Mcalpine, MD;  Location: [facility];  Service: Orthopedics;  Laterality: Left;  interscalene block   SPINE SURGERY     TOTAL HIP ARTHROPLASTY Left 09/18/2017   @[facility] , [place]   TOTAL HIP REVISION Left 09/10/2021   Procedure: Left hip acetabular versus total hip arthroplasty revision, constrained liner;  Surgeon: Ollen Gross, MD;  Location: [facility];  Service: Orthopedics;  Laterality:  Left;   Patient Active Problem List   Diagnosis Date Noted   Drug induced constipation 12/30/2022   Acquired hypothyroidism 12/30/2022   Primary osteoarthritis of left hip 05/18/2021   PD (Parkinson's disease) 05/18/2021   History of gastroesophageal reflux (GERD) 05/18/2021   OSA (obstructive sleep apnea) 05/18/2021   Essential hypertension 05/18/2021    ONSET DATE: 2022  REFERRING DIAG: G20.A1 (ICD-10-CM) - Parkinson's disease   THERAPY DIAG:  Unsteadiness on feet  Other abnormalities of gait and mobility  Muscle weakness (generalized)  Difficulty in walking, not elsewhere classified  Other symptoms and signs involving the nervous system  Rationale for Evaluation and  Treatment: Rehabilitation  SUBJECTIVE:                                                                                                                                                                                             SUBJECTIVE STATEMENT: Having some pain/discomfort in left anterior groin from overdoing it on leg press yesterday  Pt accompanied by: self  PERTINENT HISTORY:   The last fall he had was with a 3 wheeled walker and it was in January. This is the only fall in the past 6 months.   He has R foot drag and he wears an AFO for support noting the foot rolls.He only wears the AFO when coming to therapy or working out. He reports the foot rolls at other times.   Objective: Timed Up and Go test:15.94 seconds   10 meter walk test: 32.8/12.81 seconds=2.56 ft/sec   5 time sit to stand test:18.60 seconds   Other: Note bilat trendelenberg gait, R foot slap (even with AFO donned), forward leaning trunk posture, tandem gait due to hip weakness.  PAIN:  Are you having pain? Yes: NPRS scale: 4/10 Pain location: left anterior groin/hip Pain description: discomfort Aggravating factors: stretching Relieving factors: standing  PRECAUTIONS: Fall  WEIGHT BEARING RESTRICTIONS: No  FALLS: Has patient fallen in last 6 months? Yes. Number of falls 2  LIVING ENVIRONMENT: Lives with: lives with their family Lives in: House/apartment Stairs: Yes: Internal: 14 stairs to man cave steps; can reach both Has following equipment at home: Dan Humphreys - 4 wheeled  PLOF: Independent with household mobility with device and Independent with community mobility with device  PATIENT GOALS: improve balance  OBJECTIVE:   TODAY'S TREATMENT: 01/29/23 Activity Comments  Manual therapy -left hip traction, oscillations, PROM w/ overpressure, scour's position, FABER position, hanging off EOB with rectus femoris stretch  Pain with resisted left hip flexion   STG/LTG performance/review   Pt education  -discussed benefits of more supportive hinged AFO for his right  foot drop due to increased toe drag apparent -discussion of HIIT and cardio zones for efforts of 80-85% HRmax             Access Code: [email or web address] URL: [email or web address] Date: 01/08/2023 Prepared by: [facility] - Outpatient  Rehab - [facility]  Program Notes Seated and standing PWR! Up, 2 x 10 repsSeated PWR! Step-step out and in, 3# weight, 3 x 10 Standing on cushion at sink:  feet apart/feet together/feet partial heel to toe:  head turns/nods eyes open and eyes closed Standing on the cushion facing sink:  side step and weightshift x 10, back step and weightshift x 10, FOCUS ON CONTROL  Exercises - Seated March  - 1 x daily - 5 x weekly - 3 sets - 10 reps - Seated Thoracic Flexion and Rotation with Swiss Ball  - 1 x daily - 5 x weekly - 2 sets - 10 reps - 5 sec hold  PATIENT EDUCATION: Education details: Continue current HEP; discussed POC (possible next visit?) Person educated: Patient Education method: Explanation, Demonstration, and Handouts Education comprehension: verbalized understanding, returned demonstration, and needs further education    ------------------------------------------------- Objective measures below taken at initial evaluation:  DIAGNOSTIC FINDINGS: n/a for this episode  COGNITION: Overall cognitive status: Within functional limits for tasks assessed   SENSATION: WFL, right plantar surface numb  COORDINATION: Able to perform rapid alternating movements  EDEMA:  none  MUSCLE TONE: normal, no spasticity or cogwheeling noted in LLE  MUSCLE LENGTH: Full knee extension in sitting    POSTURE: forward head  LOWER EXTREMITY ROM:     Active  Right Eval Left Eval  Hip flexion    Hip extension    Hip abduction    Hip adduction    Hip internal rotation    Hip external rotation    Knee flexion 110 110  Knee extension 0 0  Ankle dorsiflexion 10 10  Ankle  plantarflexion    Ankle inversion    Ankle eversion     (Blank rows = not tested)  LOWER EXTREMITY MMT:    Foot drop/drag on Right ankle  BED MOBILITY:  independent  TRANSFERS: Assistive device utilized: Environmental consultant - 4 wheeled  Sit to stand: Complete Independence Stand to sit: Complete Independence Chair to chair: Complete Independence and Modified independence Floor:  NT   CURB:  Level of Assistance: Modified independence Assistive device utilized: Environmental consultant - 4 wheeled Curb Comments:   STAIRS: Level of Assistance: Modified independence Stair Negotiation Technique: Alternating Pattern  with Bilateral Rails Number of Stairs: 12  Height of Stairs: 4-6"  Comments:   GAIT: Gait pattern: trendelenburg, trunk flexed, and wide BOS Distance walked:  Assistive device utilized: Environmental consultant - 4 wheeled Level of assistance: Modified independence and SBA Comments:   FUNCTIONAL TESTS:  5 times sit to stand: 14 sec Mini-BESTest : 16/28 Berg Balance Test: TBD      PATIENT EDUCATION: Education details: assessment details Person educated: Patient Education method: Explanation Education comprehension: verbalized understanding  HOME EXERCISE PROGRAM: TBD  GOALS: Goals reviewed with patient? Yes  SHORT TERM GOALS: Target date: 01/07/2023    Patient will be independent in HEP to improve functional outcomes Baseline: Goal status: MET, 01/06/2023  2.  Demo improved balance and reduced risk for falls per score 45/56 Berg Balance Test Baseline: 34/56 12/16/2022> 41/56 01/06/2023; (01/29/23) 47/56 Goal status: MET    LONG TERM GOALS: Target date: 02/04/2023    Demo improved balance, postural control, and reduced risk for  falls per score 22/28 Mini-BESTest Baseline: 16/28; (01/29/23) 17/28 Goal status: NOT MET  2.  Demo independence in complex HEP including activities for balance, strength, coordination, and ambulation Baseline:  Goal status: MET  3.  Teach-back appropriate  community events/classes for those with PD Baseline: reports participating at [employer] 2x/wk, [employer] boxing on computer 1x/wk, and initiating cycling class for PD at [facility] Goal status: MET    ASSESSMENT:  CLINICAL IMPRESSION: Performance/review of STG/LTG with 1-point improvement with Mini-BESTest from 16 to 17/28 indicating increased risk for falls. Berg Balance Test performance with improved score from previous 41 to 47/56 indicating low risk for falls.  Discussion regarding POC details and patient's current activity participation.  He participates in several PD-specific training classes in the community and reports interest in pursuing local cycling group for cardio HIIT.  Demo teach-back of relevant training principles.  We will conclude PT at this time and f/u with 40-month mobility screen. OBJECTIVE IMPAIRMENTS: Abnormal gait, decreased activity tolerance, decreased balance, decreased coordination, difficulty walking, and decreased strength.   ACTIVITY LIMITATIONS: carrying, lifting, bending, transfers, and locomotion level  PARTICIPATION LIMITATIONS: cleaning, laundry, shopping, community activity, and yard work  PERSONAL FACTORS: Age, Time since onset of injury/illness/exacerbation, and 1-2 comorbidities: PD, lumbar sx, hip THR and revision  are also affecting patient's functional outcome.   REHAB POTENTIAL: Good  CLINICAL DECISION MAKING: Evolving/moderate complexity  EVALUATION COMPLEXITY: Moderate  PLAN:  PT FREQUENCY: 1-2x/week, 2x/wk x 4 wks then 1xwk/ x 4 wks  PT DURATION: 8 weeks  PLANNED INTERVENTIONS: Therapeutic exercises, Therapeutic activity, Neuromuscular re-education, Balance training, Gait training, Patient/Family education, Self Care, Joint mobilization, Stair training, Vestibular training, Canalith repositioning, DME instructions, Aquatic Therapy, Dry Needling, Electrical stimulation, Spinal mobilization, and Manual therapy  PLAN FOR NEXT SESSION:D/C to HEP  and community classes. F/U mobility screen Q 6 months  10:27 AM, 01/29/23 M. Shary Decamp, PT, DPT Physical Therapist- [place] Office Number: [phone]

## 2023-02-03 ENCOUNTER — Ambulatory Visit: Payer: Medicare Other | Attending: Neurology

## 2023-02-03 DIAGNOSIS — R471 Dysarthria and anarthria: Secondary | ICD-10-CM | POA: Insufficient documentation

## 2023-02-03 DIAGNOSIS — R1312 Dysphagia, oropharyngeal phase: Secondary | ICD-10-CM | POA: Insufficient documentation

## 2023-02-03 NOTE — Therapy (Signed)
OUTPATIENT SPEECH LANGUAGE PATHOLOGY PARKINSON'S TREATMENT/PROGRESS NOTE   Patient Name: Jim Carson MRN: [id number] DOB:August 21, 1950, 72 y.o., male Today's Date: 02/03/2023  PCP: Malka So., MD REFERRING PROVIDER: Carrie Mew, MD  END OF SESSION:  End of Session - 02/03/23 0902     Visit Number 11    Number of Visits 17    Date for SLP Re-Evaluation 02/14/23    SLP Start Time 0852    SLP Stop Time  0932    SLP Time Calculation (min) 40 min    Activity Tolerance Patient tolerated treatment well                Past Medical History:  Diagnosis Date   Abnormal gait    due to parkinson's,  uses cane   Allergic rhinitis    Allergy Spring 1998   Allergic to dustmits, dog hair and roaches.   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 Lorin Picket  @[facility]   Pneumonia    Sleep apnea    Vitamin B12 deficiency    Vitamin D deficiency    Wears glasses    Past Surgical History:  Procedure Laterality Date   APPENDECTOMY  07/1964   JOINT REPLACEMENT  March 2019 and again on 09/10/2021   Left hip twice.  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;  Service: Orthopedics;  Laterality: Left;  interscalene block   SPINE SURGERY     TOTAL HIP ARTHROPLASTY Left 09/18/2017   @[facility] , [place]   TOTAL HIP REVISION Left 09/10/2021   Procedure: Left hip acetabular versus total hip arthroplasty revision, constrained liner;  Surgeon: Ollen Gross, MD;  Location: [facility];  Service: Orthopedics;  Laterality: Left;   Patient Active Problem List   Diagnosis Date Noted   Drug induced constipation 12/30/2022   Acquired hypothyroidism 12/30/2022   Primary osteoarthritis of left hip 05/18/2021   PD (Parkinson's disease) 05/18/2021   History of gastroesophageal reflux (GERD) 05/18/2021   OSA (obstructive sleep apnea) 05/18/2021   Essential hypertension 05/18/2021  Speech Therapy Progress Note  Dates of Reporting Period: 12/16/22 to present  Subjective Statement: Pt has been seen for 10 visits of ST targeting speech intelligibility, and swallowing.  Objective: See below  Goal Update: See below.  Plan: See below  Reason Skilled Services  are Required: Pt has not met rehab potential at this time.   ONSET DATE: 2013 (script dated 11/22/22)  REFERRING DIAG:  G20.A1 (ICD-10-CM) - Parkinson's disease    THERAPY DIAG:  Dysarthria and anarthria  Dysphagia, oropharyngeal phase  Rationale for Evaluation and Treatment: Rehabilitation  SUBJECTIVE:   SUBJECTIVE STATEMENT:  "Feeling a little soreness in my lt hip today." Pt accompanied by: self  PERTINENT HISTORY: Patient has past medical history significant for Parkinson's disease diagnosed in 2013 symptomatic starting 2012, gait disturbance, dysphagia, allergic rhinitis, GERD, OSA on CPAP. Prior surgical history includes cervical spine fusion posterior  2013 and patient denies dysphagia following, left hip revision 2020, posterior laminectomy/decompression of lumbar spine, removal left T1 paraspinous mass. Pt had ST course in 2021 which was successful in improving speech loudness, and participation in swallowing exercises.  PAIN:  Are you having pain? Yes: NPRS scale: 2/10 Pain location: lt hip Pain description: sore Aggravating factors: movement Relieving factors: stretch it  FALLS: Has patient fallen in last 6 months?  See PT evaluation for details  PATIENT GOALS: Improve loudness and swallowing  OBJECTIVE:   DIAGNOSTIC FINDINGS:  FINDINGS FROM MBS 12/11/22: (MODIFIED WITH BOLD PRINT) Clinical Impression: Patient swallow function is consistent with prior modified barium swallow study.  Mild oropharyngeal phase dysphagia continues due to patient's Parkinson's.  Minimal mastication of solids noted despite saying he felt like he chewed well.  Pharyngeal timing of swallow actually appeared improved with swallowing trigger at tongue base, vallecula with pures and piriform sinus with liquids.  Retention mostly vallecular and on the superior surface of epiglottis present due to diminished tongue base retraction and wall motility.  Patient senses retention at the piriform sinus when it is indeed located at the vallecular space. Liquid swallows are most effective to diminish amount of pudding and cracker bolus retained.  Various postures including chin, head turn right, head turn left did not appear to significantly improved pharyngeal clearance.  If any posture was helpful perhaps head turn left may have been slightly helpful.  Cueing patient to expectorate (cough) was noted to clear vallecular retention that he did want clear with dry swallows.     Motility adequacy of musculature varied during the study with epiglottic deflection better with liquids than with solids.  Patient was able to easily swallow barium tablet with liquid without deficits, with  no aspiration or penetration..  Of note he did not cough or clear his throat during entire study while swallowing barium however prior to barium administration patient did clear his throat immediately post and water swallow.     Highly recommend patient follow-up with speech pathologist to establish exercise regimen to improve pharyngeal motility.  Recommend he masticates well, follow solids with liquids, maintain strength of cough, voice, and expectoration for airway protection.  Also provided him information regarding a vacuum device for clearance of aspiration for emergent use.  Thank you for this consult.   PATIENT REPORTED OUTCOME MEASURES (PROM): Question Patient's Response  My swallowing problem has caused me to lose weight 0  2.  My swallowing problem interferes with my ability to go out to meals 0  3.  Swallowing liquids takes extra effort 0  4.  Swallowing solids takes extra effort 2  5.  Swallowing pills takes extra effort 0  6.  Swallowing is painful 0  7.  The pleasure of eating is affected by my swallowing 1  8.  When I swallow food sticks in my throat 2  9.  I cough when I eat 3  10.  Swallowing is stressful  2    Communication Effectiveness Survey        How effective is your speech in... Pt Rating  Having a conversation with family/friends 2  2.   Conversing with strangers 2  3.   Talking to someone familiar on the phone 2.5  4.   Talking to someone unfamiliar on the phone 2.5  5.   Being part of conversation in a noisy environment  2.5  6.   Speaking to someone when angry or upset 3  7.   Conversing while travelling in the car 2.5  8.   Conversing across distance  3   TODAY'S TREATMENT:                                                                                                                                         DATE:   SO=Speak Out  02/03/23: Needs accuracy with swallow HEP assessed next session. Pt is on Lesson 22 today (did lesson 21 yesterday). Has not  been consistent with SO as directed, since last session, as he should be on Lesson 24 today, based upon Lesson 19 last session.  SLP targeted improving vocal quality and increasing intensity through progressively difficult speech tasks using Speak Out! program which works with intentionality. SLP lead pt through exercises with the following averages this date:  M-words: 81 dB with initial cues for volume and slowing productions down (initial min cues faded to independent) Sustained "ah" 88 dB with initial min cue (and then independent) for reduction in loudness Glides with average 84 dB with usual min cues for keeping same level of intent on low register and higher register Numbers with average 84dB; initial min cues for intent/slowing rate Reading (connected paragraphs) 83dB with occasional min-mod cues for intentional speech - pt sped up selection  Cognitive speech task 70dB with rare min A for more controlled, CEO speaking Conversational task: Pt req'd min-mod A occasionally with conversational task today - average 70 dB with WNL speech rate.  01/29/23: Pt  has been consistent with SO since last session. Lesson 19 today with pt. SLP targeted improving vocal quality and increasing intensity through progressively difficult speech tasks using Speak Out! program which works with intentionality. SLP lead pt through exercises with the following averages this date:  M-words: 85 dB with initial cues for volume and slowing productions down (initial min cues faded to independent) Sustained "ah" 88 dB with initial min cue (and then independent) for reduction in loudness Glides with average 83 dB with min cues for holding low and high notes and then performing glides Reading (paragraphs) 84 dB with usual min-mod cues for intent Cognitive speech task 74dB Conversational task: Pt again did very well with conversational task today - average 72 dB with WNL speech rate.  01/27/23: SLP explained to pt that ONE  lesson per day, BID, (with second time optional for online session) is the aim with SO. Pt acknowledged understanding. As in "S" pt endorsed difficulty with consistency of SO over the weekend - SLP suggested his second time/day completing SO should be with online due to necessary occasional min-mod cues for glides.  Lesson 17 today with pt. SLP targeted improving vocal quality and increasing intensity through progressively difficult speech tasks using Speak Out! program which works with intentionality. SLP lead pt through exercises with averages this date:  M-words: 83dB with initial cues for connecting the words and for volume (initial min cues faded to independent) Sustained "ah" 85 dB with initial min cue (and then independent) for reduction in loudness Glides with average 83 dB with occasional mod cues for holding low and high notes and then performing glides Reading (phrases) 83 dB Cognitive speech task 74dB Conversational task: Pt did very well with conversational task today role-playing changing an ST appointment - average 72 dB.  01/23/23: Swallowing: Pt indicated he is performing safe swallow strategies with POs. SLP observed pt today with fig bar and water. Pt followed precautions with independence and without any overt s/sx oral or pharyngeal deficits.  Speech: Pt was more consistent with SO lessons since last session. On lesson 13 today, so SLP needs to explain pt does ONE lesson each day. SLP targeted improving vocal quality and increasing intensity through progressively difficulty speech tasks using Speak Out! program which works with intentionality. ST lead pt through exercises with averages this date:  M-words: 79dB with initial cues for connecting the words and for volume (initial mod cues) Sustained "ah" 88 dB with initial rare min cues for reduction in loudness Glides with average 83 dB with mod cues for holding low and high notes and then performing glides Reading (phrases) 84  dB Cognitive speech task 77 dB Conversational task: Pt req'd occasional cues for using intentional speech for maintaining average 70 dB.  01/20/23: SPEECH: Pt indicated the last lesson he completed was Lesson 6. So SLP assisted pt in completing Lesson 7. SLP asked pt what was th ebarrier to consitency with SP and pt stated he needed to have a specific time each day to complete. SLP worked with pt to find these times and stated that he could begin his first lesson of the day between 10-11AM, and his second lesson between 5-6PM. If this does not work well for pt SLP and pt agreed to shelf SO and do traditional ST for loudness/clarity.  M-words: average 85dB,  Sustained "ah" average 90 dB  Glides - occasional mod faded to rare min cues for hold pitch at bottom and top, average 83 dB Numbers - rare min cues for "swinging" numbers instead of separate voiced productions- average 84 dB Reading (phrases) 82 dB Conversation task role playing ordering apple pie- average of 72 dB  Conversation going to and from ST room was 100% intelligible however was with average upper 60s dB SWALLOW: Pt completed his swallow HEP with SBA.  01/15/23: Given "S", if pt consistency with SO does not improve SLP may have to abandon the program and modify plan for using traditional dysarthria therapy. SLP told pt he should complete lessons on his own at home, and he and SLP should be doing lesson 10 on Monday when he arrives for ST.  SPEECH: SLP worked with patient with SO lesson 5. Some tasks were modified for reduced breath support.  M-words: average 86dB, with occasional min cue to slow production and  decr volume Sustained "ah" average 90 dB with initial cue to decr volume and open mouth Glides - usual max faded to occasional mod-max cues for hold pitch at bottom and top, average 82 dB Numbers - initial min cues for "swinging" numbers instead of separate voiced productions- average 85 dB Reading (phrases) 84 dB Conversation  task role playing MD office- again, average of 72 dB  Conversation going to and from ST room was 100% intelligible.   01/06/23: SPEECH: Given "s", SLP reiterated daily practice of SO lesson x2, or once with daily lesson and once with online practice. SLP repeated this x3 during today's session. Pt told SLP he would complete online session today, and cont with subsequent lesson + online practice. SLP targeted improving vocal quality and increasing intensity through progressively difficulty speech tasks using Speak Out! program, lesson 3. ST lead pt through exercises providing occasional model with min-mod-A required to achieve target dB this date. Averages this date:  M-words: 88dB, with initial cue to slow production and decr volume Sustained "ah" 87 dB initial cue to decr volume Glides - usual max faded to occasional mod-max cues for hold pitch at bottom and top, average 83 dB Numbers - mod-max initial cues for "swinging" numbers instead of separate production- average 85 dB Reading (phrases) 82 dB Conversation task - average 72 dB Speech between tasks was sub-WNL. This improved after the reading and conversation tasks.   01/01/23: SWALLOW: pt demonstrates 5/5 swallow exercises with independence, reports to daily implementation. Jim Carson continues to use tally marks to ensure completion of all exercises.  SPEECH: SLP inquired about pt's consistency with SO, pt acknowledged he has not been consistent. SLP educated pt that if he is finding it difficult to complete daily that he needs to take a break and return to ST when he can complete daily. SLP reiterated daily practice lesson x2, or once with daily lesson and once with online practice. Showed pt webpage with online practice for him to navigate to on his own if desired. Pt told SLP he would complete today and subsequent days x2 as directed.  SLP targeted improving vocal quality and increasing intensity through progressively difficulty speech tasks using  Speak Out! program, lesson 2. ST lead pt through exercises providing occasional model with min-mod-A required to achieve target dB this date. Averages this date:  M-words: 85dB Sustained "ah" 88 dB Reading (phrases) 82 dB Cognitive speech task 78 dB Conversational task: Conversational sample of approx 3 minutes, pt averaged 71 dB with occasional min A.  Benefiting from occasional min -mod A for improved breath support during warm up tasks, though some tasks were modified to accommodate decreased breath support. Goal is to complete warm up exercises of speak out daily before next session.   12/23/22: SWALLOW: pt demonstrates 5/5 swallow exercises with mod-I, reports to daily implementation. Using tally marks to ensure completion of all exercises.  SPEECH: SLP re-introduced pt to speak out, explained intentional speech and it's role  Target improving vocal quality and increasing intensity through progressively difficulty speech tasks using Speak Out! program, lesson 1. ST leads pt through exercises providing usual model prior to pt execution. usual mod-A required to achieve target dB this date. Averages this date:  Sustained "ah" 91 dB Reading (phrases) 80 dB Cognitive speech task 78 dB Conversational task:  Benefiting from tactile cues for improved breath support during warm up tasks, though  some tasks were modified to accommodate cognitive load and decreased breath support. Conversational sample of approx  5 minutes, pt averages 71 dB with occasional min-A. Goal is to complete warm up exercises of speak out daily between this and next session.    12/18/22: SWALLOW: reviewed pt's dysphagia HEP. SLP had to re-educate pt about procedure for [facility]. SLP taught pt the alternate to [employer] (CTAR - "chin pushback" on pt's sheet) in previous session due to pt's cervical/back issues.  SPEECH: SLP ordered pt Speak Out book today. Introduced pt to [employer] everyday tasks May-me-my-moe-moo, ah, and glide.  Pt with difficulty with breath support due to other dx's. Cue to "Be intentional with your air" was helpful but pt had to rush May-Moo, and with glide req'd cues for starting hi -lo glide at roughly the same Hz he ended on with lo-hi glide. Pt was independent by session end. Told pt to complete these tasks BID in prep for start of Speak Out.  12/16/22 (eval): SLP reviewed results from [facility] and provided precautions and HEP.   PATIENT EDUCATION: Education details: See "pt instructions" and "today's treatment". Person educated: Patient Education method: one or more of: Explanation, Demonstration, Verbal cues, and Handouts Education comprehension: One or more of : verbalized understanding, returned demonstration, verbal cues required, and needs further education   GOALS: Goals reviewed with patient? Yes  SHORT TERM GOALS: Target date: 01/24/23 (modified due to visit number)  Pt will complete dysphagia HEP with modified independence x2 sessions Baseline: 01/01/23 Goal status: MET  2.  Pt will perform thorough mastication and alternate bite/sip precautions with POs during session with modified independence in 2 sessions Baseline: 01/23/23 Goal status: Partially met  3.  Pt will complete vocal warm up tasks (ah, glides, counting, etc) with rare min A in 2 sessions Baseline: 01/20/23 Goal status: Partially met  4.  Jim Carson will improve speech volume in 5 minutes simple conversation to low 70s dB in 3 sessions Baseline:  Goal status: Not met  5.  Pt will demonstrate completion of home exercises as prescribed 90% compliance in first 2 weeks Baseline:  Goal status: NOT MET   LONG TERM GOALS: Target date: 02/14/23  Pt will complete dysphagia HEP with independence x3 sessions Baseline: 01/20/23 Goal status: INITIAL  2.  Pt will perform thorough mastication and alternate bite/sip precautions with POs during session with independence in 3 sessions Baseline: 7/25 Goal status: INITIAL  3.  Pt will  complete vocal warm up tasks (ah, glides, counting, etc) with modified independence in 3 sessions Baseline:  Goal status: INITIAL  4.  Jim Carson will improve speech volume in 10 minutes simple-mod complex conversation to low 70s dB in 3 sessions Baseline:  Goal status: INITIAL  5.  In last 1-2 sessions, both of Bobby's PROM scores will improve compared to initial administration Baseline:  Goal status: INITIAL  6.  Jim Carson will tell SLP what HEP to perform in order to maintain WNL volume speech over time Baseline:  Goal status: INITIAL  ASSESSMENT:  CLINICAL IMPRESSION: Patient is a 72 y.o. M who was seen again today for treatment of voice (reduced voice volume), and review MBS study and initiate HEP for dysphagia. Jim Carson has had difficulty with consistency of Speak Out program over this course of therapy. He is more consistently using louder speech compared to before this therapy course, but has more difficulty with intent when cognitive demand is greater. See "today's treatment" for details of today's session.   OBJECTIVE IMPAIRMENTS: Objective impairments include dysarthria and dysphagia. These impairments are limiting patient from household responsibilities, ADLs/IADLs, effectively communicating  at home and in community, and safety when swallowing.Factors affecting potential to achieve goals and functional outcome are  none noted .Marland Kitchen Patient will benefit from skilled SLP services to address above impairments and improve overall function.  REHAB POTENTIAL: Good  PLAN:  SLP FREQUENCY: 2x/week  SLP DURATION: 8 weeks  PLANNED INTERVENTIONS: Aspiration precaution training, Pharyngeal strengthening exercises, Diet toleration management , Environmental controls, Internal/external aids, Oral motor exercises, Functional tasks, SLP instruction and feedback, Compensatory strategies, and Patient/family education    [facility], CCC-SLP 02/03/2023, 9:02 AM

## 2023-02-05 ENCOUNTER — Ambulatory Visit: Payer: Medicare Other

## 2023-02-05 DIAGNOSIS — R1312 Dysphagia, oropharyngeal phase: Secondary | ICD-10-CM

## 2023-02-05 DIAGNOSIS — R471 Dysarthria and anarthria: Secondary | ICD-10-CM

## 2023-02-05 NOTE — Therapy (Signed)
OUTPATIENT SPEECH LANGUAGE PATHOLOGY PARKINSON'S TREATMENT/PROGRESS NOTE   Patient Name: Jim Carson MRN: [id number] DOB:15-Aug-1950, 72 y.o., male Today's Date: 02/05/2023  PCP: Malka So., MD REFERRING PROVIDER: Carrie Mew, MD  END OF SESSION:       Past Medical History:  Diagnosis Date   Abnormal gait    due to parkinson's,  uses cane   Allergic rhinitis    Allergy Spring 1998   Allergic to dustmits, dog hair and roaches.   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 Lorin Picket  @[facility]   Pneumonia    Sleep apnea    Vitamin B12 deficiency    Vitamin D deficiency    Wears glasses    Past Surgical History:  Procedure Laterality Date   APPENDECTOMY  07/1964   JOINT REPLACEMENT  March 2019 and again on 09/10/2021   Left hip twice.  2nd time due to 3 dislocations   LAPAROSCOPIC CHOLECYSTECTOMY  01-21-2017   @[facility] , [place]   POSTERIOR FUSION CERVICAL SPINE  08-15-2011   @[facility] , [place]   w/ laminectomy and decompression-- C3 -- T1   POSTERIOR LAMINECTOMY / DECOMPRESSION LUMBAR SPINE  07-30-2013   @[facility] , [place]   bilateral T11 - 12,  L2 --5   REMOVAL LEFT T1 PARASPINOUS MASS  02-12-2016   @[facility] ,  [place]   desmoid fibromatosis   SHOULDER ARTHROSCOPY WITH ROTATOR CUFF REPAIR Left 12/15/2018   Procedure: SHOULDER ARTHROSCOPY, biceps tenodesis labored Debridement, submacromial decompression;  Surgeon: Eugenia Mcalpine, MD;  Location: [facility];  Service: Orthopedics;   Laterality: Left;  interscalene block   SPINE SURGERY     TOTAL HIP ARTHROPLASTY Left 09/18/2017   @[facility] , [place]   TOTAL HIP REVISION Left 09/10/2021   Procedure: Left hip acetabular versus total hip arthroplasty revision, constrained liner;  Surgeon: Ollen Gross, MD;  Location: [facility];  Service: Orthopedics;  Laterality: Left;   Patient Active Problem List   Diagnosis Date Noted   Drug induced constipation 12/30/2022   Acquired hypothyroidism 12/30/2022   Primary osteoarthritis of left hip 05/18/2021   PD (Parkinson's disease) 05/18/2021   History of gastroesophageal reflux (GERD) 05/18/2021   OSA (obstructive sleep apnea) 05/18/2021   Essential hypertension 05/18/2021  Speech Therapy Progress Note  Dates of Reporting Period: 12/16/22 to present  Subjective Statement: Pt has been seen for 10 visits of ST targeting speech intelligibility, and swallowing.  Objective: See below  Goal Update: See below.  Plan: See below  Reason Skilled Services are Required: Pt has not met rehab potential at this time.   ONSET DATE: 2013 (script dated 11/22/22)  REFERRING DIAG:  G20.A1 (ICD-10-CM) - Parkinson's disease    THERAPY DIAG:  Dysarthria and anarthria  Dysphagia, oropharyngeal phase  Rationale for Evaluation and Treatment: Rehabilitation  SUBJECTIVE:   SUBJECTIVE STATEMENT:  "Feeling a little soreness in my lt hip today." Pt accompanied by: self  PERTINENT HISTORY:  Patient has past medical history significant for Parkinson's disease diagnosed in 2013 symptomatic starting 2012, gait disturbance, dysphagia, allergic rhinitis, GERD, OSA on CPAP. Prior surgical history includes cervical spine fusion posterior 2013 and patient denies dysphagia following, left hip revision 2020, posterior laminectomy/decompression of lumbar spine, removal left T1 paraspinous mass. Pt had ST course in 2021 which was successful in improving speech loudness, and participation in swallowing  exercises.  PAIN:  Are you having pain? Yes: NPRS scale: 2/10 Pain location: lt hip Pain description: sore Aggravating factors: movement Relieving factors: stretch it  FALLS: Has patient fallen in last 6 months?  See PT evaluation for details  PATIENT GOALS: Improve loudness and swallowing  OBJECTIVE:   DIAGNOSTIC FINDINGS:  FINDINGS FROM MBS 12/11/22: (MODIFIED WITH BOLD PRINT) Clinical Impression: Patient swallow function is consistent with prior modified barium swallow study.  Mild oropharyngeal phase dysphagia continues due to patient's Parkinson's.  Minimal mastication of solids noted despite saying he felt like he chewed well.  Pharyngeal timing of swallow actually appeared improved with swallowing trigger at tongue base, vallecula with pures and piriform sinus with liquids.  Retention mostly vallecular and on the superior surface of epiglottis present due to diminished tongue base retraction and wall motility.  Patient senses retention at the piriform sinus when it is indeed located at the vallecular space. Liquid swallows are most effective to diminish amount of pudding and cracker bolus retained.  Various postures including chin, head turn right, head turn left did not appear to significantly improved pharyngeal clearance.  If any posture was helpful perhaps head turn left may have been slightly helpful.  Cueing patient to expectorate (cough) was noted to clear vallecular retention that he did want clear with dry swallows.     Motility adequacy of musculature varied during the study with epiglottic deflection better with liquids than with solids.  Patient was able to easily swallow barium tablet with liquid without deficits, with no aspiration or penetration..  Of note he did not cough or clear his throat during entire study while swallowing barium however prior to barium administration patient did clear his throat immediately post and water swallow.     Highly recommend patient  follow-up with speech pathologist to establish exercise regimen to improve pharyngeal motility.  Recommend he masticates well, follow solids with liquids, maintain strength of cough, voice, and expectoration for airway protection.  Also provided him information regarding a vacuum device for clearance of aspiration for emergent use.  Thank you for this consult.   PATIENT REPORTED OUTCOME MEASURES (PROM): Question Patient's Response  My swallowing problem has caused me to lose weight 0  2.  My swallowing problem interferes with my ability to go out to meals 0  3.  Swallowing liquids takes extra effort 0  4.  Swallowing solids takes extra effort 2  5.  Swallowing pills takes extra effort 0  6.  Swallowing is painful 0  7.  The pleasure of eating is affected by my swallowing 1  8.  When I swallow food sticks in my throat 2  9.  I cough when I eat 3  10.  Swallowing is stressful  2    Communication Effectiveness Survey        How effective is your speech in... Pt Rating  Having a conversation with family/friends 2  2.   Conversing with strangers 2  3.   Talking to someone familiar on the phone 2.5  4.   Talking to someone unfamiliar on  the phone 2.5  5.   Being part of conversation in a noisy environment  2.5  6.   Speaking to someone when angry or upset 3  7.   Conversing while travelling in the car 2.5  8.   Conversing across distance  3   TODAY'S TREATMENT:                                                                                                                                         DATE:   SO=Speak Out  02/05/23: NEEDS CES COMPLETED. SWALLOW: Jim Carson demonstrates 5/5 swallow exercises with independence, reports daily implementation. SLP told him to decr to x3/week to maintain progress and provided rationale. He ate fig bar and drank water and followed all precautions independently. He returned EAT-10 with score of 6, which is an improved score compared to when last administered.   SPEECH: Pt has struggled with being consistent with SO. SLP reiterated daily practice after today's lesson, Lesson 24, at least x5 days/week choosing a lesson from the book or participating on the online practice. He thought he would do best with online practice.   SLP targeted improved vocal quality and increasing intensity through progressively difficulty speech tasks using Speak Out! program, lesson 24. ST lead pt through exercises with averages this date:  M-words: 85dB Sustained "ah" 88 dB Reading (phrases) 82 dB - initial cue for intent 100% of the passage Cognitive speech task 76 dB Conversational task: In conversation throughout the session, pt maintained average volume of low 70s dB.    02/03/23: Needs accuracy with swallow HEP assessed next session. Pt is on Lesson 22 today (did lesson 21 yesterday). Has not been consistent with SO as directed, since last session, as he should be on Lesson 24 today, based upon Lesson 19 last session.  SLP targeted improving vocal quality and increasing intensity through progressively difficult speech tasks using Speak Out! program which works with intentionality. SLP lead pt through exercises with the following averages this date:  M-words: 81 dB with initial cues for volume and slowing productions down (initial min cues faded to independent) Sustained "ah" 88 dB with initial min cue (and then independent) for reduction in loudness Glides with average 84 dB with usual min cues for keeping same level of intent on low register and higher register Numbers with average 84dB; initial min cues for intent/slowing rate Reading (connected paragraphs) 83dB with occasional min-mod cues for intentional speech - pt sped up selection  Cognitive speech task 70dB with rare min A for more controlled, CEO speaking Conversational task: Pt req'd min-mod A occasionally with conversational task today - average 70 dB with WNL speech rate.  01/29/23: Pt  has been consistent with  SO since last session. Lesson 19 today with pt. SLP targeted improving vocal quality and increasing intensity through progressively difficult speech tasks using Speak Out! program which works with intentionality.  SLP lead pt through exercises with the following averages this date:  M-words: 85 dB with initial cues for volume and slowing productions down (initial min cues faded to independent) Sustained "ah" 88 dB with initial min cue (and then independent) for reduction in loudness Glides with average 83 dB with min cues for holding low and high notes and then performing glides Reading (paragraphs) 84 dB with usual min-mod cues for intent Cognitive speech task 74dB Conversational task: Pt again did very well with conversational task today - average 72 dB with WNL speech rate.  01/27/23: SLP explained to pt that ONE lesson per day, BID, (with second time optional for online session) is the aim with SO. Pt acknowledged understanding. As in "S" pt endorsed difficulty with consistency of SO over the weekend - SLP suggested his second time/day completing SO should be with online due to necessary occasional min-mod cues for glides.  Lesson 17 today with pt. SLP targeted improving vocal quality and increasing intensity through progressively difficult speech tasks using Speak Out! program which works with intentionality. SLP lead pt through exercises with averages this date:  M-words: 83dB with initial cues for connecting the words and for volume (initial min cues faded to independent) Sustained "ah" 85 dB with initial min cue (and then independent) for reduction in loudness Glides with average 83 dB with occasional mod cues for holding low and high notes and then performing glides Reading (phrases) 83 dB Cognitive speech task 74dB Conversational task: Pt did very well with conversational task today role-playing changing an ST appointment - average 72 dB.  01/23/23: Swallowing: Pt indicated he is performing  safe swallow strategies with POs. SLP observed pt today with fig bar and water. Pt followed precautions with independence and without any overt s/sx oral or pharyngeal deficits.  Speech: Pt was more consistent with SO lessons since last session. On lesson 13 today, so SLP needs to explain pt does ONE lesson each day. SLP targeted improving vocal quality and increasing intensity through progressively difficulty speech tasks using Speak Out! program which works with intentionality. ST lead pt through exercises with averages this date:  M-words: 79dB with initial cues for connecting the words and for volume (initial mod cues) Sustained "ah" 88 dB with initial rare min cues for reduction in loudness Glides with average 83 dB with mod cues for holding low and high notes and then performing glides Reading (phrases) 84 dB Cognitive speech task 77 dB Conversational task: Pt req'd occasional cues for using intentional speech for maintaining average 70 dB.  01/20/23: SPEECH: Pt indicated the last lesson he completed was Lesson 6. So SLP assisted pt in completing Lesson 7. SLP asked pt what was th ebarrier to consitency with SP and pt stated he needed to have a specific time each day to complete. SLP worked with pt to find these times and stated that he could begin his first lesson of the day between 10-11AM, and his second lesson between 5-6PM. If this does not work well for pt SLP and pt agreed to shelf SO and do traditional ST for loudness/clarity.  M-words: average 85dB,  Sustained "ah" average 90 dB  Glides - occasional mod faded to rare min cues for hold pitch at bottom and top, average 83 dB Numbers - rare min cues for "swinging" numbers instead of separate voiced productions- average 84 dB Reading (phrases) 82 dB Conversation task role playing ordering apple pie- average of 72 dB  Conversation going to and from ST  room was 100% intelligible however was with average upper 60s dB SWALLOW: Pt completed his  swallow HEP with SBA.  01/15/23: Given "S", if pt consistency with SO does not improve SLP may have to abandon the program and modify plan for using traditional dysarthria therapy. SLP told pt he should complete lessons on his own at home, and he and SLP should be doing lesson 10 on Monday when he arrives for ST.  SPEECH: SLP worked with patient with SO lesson 5. Some tasks were modified for reduced breath support.  M-words: average 86dB, with occasional min cue to slow production and decr volume Sustained "ah" average 90 dB with initial cue to decr volume and open mouth Glides - usual max faded to occasional mod-max cues for hold pitch at bottom and top, average 82 dB Numbers - initial min cues for "swinging" numbers instead of separate voiced productions- average 85 dB Reading (phrases) 84 dB Conversation task role playing MD office- again, average of 72 dB  Conversation going to and from ST room was 100% intelligible.   01/06/23: SPEECH: Given "s", SLP reiterated daily practice of SO lesson x2, or once with daily lesson and once with online practice. SLP repeated this x3 during today's session. Pt told SLP he would complete online session today, and cont with subsequent lesson + online practice. SLP targeted improving vocal quality and increasing intensity through progressively difficulty speech tasks using Speak Out! program, lesson 3. ST lead pt through exercises providing occasional model with min-mod-A required to achieve target dB this date. Averages this date:  M-words: 88dB, with initial cue to slow production and decr volume Sustained "ah" 87 dB initial cue to decr volume Glides - usual max faded to occasional mod-max cues for hold pitch at bottom and top, average 83 dB Numbers - mod-max initial cues for "swinging" numbers instead of separate production- average 85 dB Reading (phrases) 82 dB Conversation task - average 72 dB Speech between tasks was sub-WNL. This improved after the  reading and conversation tasks.   01/01/23: SWALLOW: pt demonstrates 5/5 swallow exercises with independence, reports to daily implementation. Jim Carson continues to use tally marks to ensure completion of all exercises.  SPEECH: SLP inquired about pt's consistency with SO, pt acknowledged he has not been consistent. SLP educated pt that if he is finding it difficult to complete daily that he needs to take a break and return to ST when he can complete daily. SLP reiterated daily practice lesson x2, or once with daily lesson and once with online practice. Showed pt webpage with online practice for him to navigate to on his own if desired. Pt told SLP he would complete today and subsequent days x2 as directed.  SLP targeted improving vocal quality and increasing intensity through progressively difficulty speech tasks using Speak Out! program, lesson 2. ST lead pt through exercises providing occasional model with min-mod-A required to achieve target dB this date. Averages this date:  M-words: 85dB Sustained "ah" 88 dB Reading (phrases) 82 dB Cognitive speech task 78 dB Conversational task: Conversational sample of approx 3 minutes, pt averaged 71 dB with occasional min A.  Benefiting from occasional min -mod A for improved breath support during warm up tasks, though some tasks were modified to accommodate decreased breath support. Goal is to complete warm up exercises of speak out daily before next session.   12/23/22: SWALLOW: pt demonstrates 5/5 swallow exercises with mod-I, reports to daily implementation. Using tally marks to ensure completion of all exercises.  SPEECH: SLP re-introduced pt to speak out, explained intentional speech and it's role  Target improving vocal quality and increasing intensity through progressively difficulty speech tasks using Speak Out! program, lesson 1. ST leads pt through exercises providing usual model prior to pt execution. usual mod-A required to achieve target dB this date.  Averages this date:  Sustained "ah" 91 dB Reading (phrases) 80 dB Cognitive speech task 78 dB Conversational task:  Benefiting from tactile cues for improved breath support during warm up tasks, though  some tasks were modified to accommodate cognitive load and decreased breath support. Conversational sample of approx 5 minutes, pt averages 71 dB with occasional min-A. Goal is to complete warm up exercises of speak out daily between this and next session.    12/18/22: SWALLOW: reviewed pt's dysphagia HEP. SLP had to re-educate pt about procedure for [facility]. SLP taught pt the alternate to [place] (CTAR - "chin pushback" on pt's sheet) in previous session due to pt's cervical/back issues.  SPEECH: SLP ordered pt Speak Out book today. Introduced pt to [employer] everyday tasks May-me-my-moe-moo, ah, and glide. Pt with difficulty with breath support due to other dx's. Cue to "Be intentional with your air" was helpful but pt had to rush May-Moo, and with glide req'd cues for starting hi -lo glide at roughly the same Hz he ended on with lo-hi glide. Pt was independent by session end. Told pt to complete these tasks BID in prep for start of Speak Out.  12/16/22 (eval): SLP reviewed results from [facility] and provided precautions and HEP.   PATIENT EDUCATION: Education details: See "pt instructions" and "today's treatment". Person educated: Patient Education method: one or more of: Explanation, Demonstration, Verbal cues, and Handouts Education comprehension: One or more of : verbalized understanding, returned demonstration, verbal cues required, and needs further education   GOALS: Goals reviewed with patient? Yes  SHORT TERM GOALS: Target date: 01/24/23 (modified due to visit number)  Pt will complete dysphagia HEP with modified independence x2 sessions Baseline: 01/01/23 Goal status: MET  2.  Pt will perform thorough mastication and alternate bite/sip precautions with POs during session with modified  independence in 2 sessions Baseline: 01/23/23 Goal status: Partially met  3.  Pt will complete vocal warm up tasks (ah, glides, counting, etc) with rare min A in 2 sessions Baseline: 01/20/23 Goal status: Partially met  4.  Jim Carson will improve speech volume in 5 minutes simple conversation to low 70s dB in 3 sessions Baseline:  Goal status: Not met  5.  Pt will demonstrate completion of home exercises as prescribed 90% compliance in first 2 weeks Baseline:  Goal status: NOT MET   LONG TERM GOALS: Target date: 02/14/23  Pt will complete dysphagia HEP with independence x3 sessions Baseline: 01/20/23, 02/05/23 Goal status: INITIAL  2.  Pt will perform thorough mastication and alternate bite/sip precautions with POs during session with independence in 3 sessions Baseline: 01/23/23, 02/05/23 Goal status: INITIAL  3.  Pt will complete vocal warm up tasks (ah, glides, counting, etc) with modified independence in 3 sessions Baseline: 02/05/23 Goal status: INITIAL  4.  Jim Carson will improve speech volume in 10 minutes simple-mod complex conversation to low 70s dB in 3 sessions Baseline: 02/05/23 Goal status: INITIAL  5.  In last 1-2 sessions, both of Jim Carson's PROM scores will improve compared to initial administration Baseline:  Goal status: INITIAL  6.  Jim Carson will tell SLP what HEP to perform in order to maintain WNL volume speech over time  Baseline:  Goal status: MET  ASSESSMENT:  CLINICAL IMPRESSION: Patient is a 72 y.o. M who was seen again today for treatment of voice (reduced voice volume), and cont work with HEP for dysphagia. He is more consistently using louder speech compared to before this therapy course, but has more difficulty with intent when cognitive demand is greater. See "today's treatment" for details of today's session. Today his speech throughout session was average 70dB. SLP and pt agreed he will return in 4 weeks to verify progress continues with speech volume and  HEP.  OBJECTIVE IMPAIRMENTS: Objective impairments include dysarthria and dysphagia. These impairments are limiting patient from household responsibilities, ADLs/IADLs, effectively communicating at home and in community, and safety when swallowing.Factors affecting potential to achieve goals and functional outcome are  none noted .Marland Kitchen Patient will benefit from skilled SLP services to address above impairments and improve overall function.  REHAB POTENTIAL: Good  PLAN:  SLP FREQUENCY: 2x/week  SLP DURATION: 8 weeks  PLANNED INTERVENTIONS: Aspiration precaution training, Pharyngeal strengthening exercises, Diet toleration management , Environmental controls, Internal/external aids, Oral motor exercises, Functional tasks, SLP instruction and feedback, Compensatory strategies, and Patient/family education    [facility], CCC-SLP 02/05/2023, 9:09 AM

## 2023-02-10 ENCOUNTER — Ambulatory Visit: Payer: Medicare Other

## 2023-02-12 ENCOUNTER — Ambulatory Visit: Payer: Medicare Other | Admitting: Speech Pathology

## 2023-02-17 ENCOUNTER — Ambulatory Visit (INDEPENDENT_AMBULATORY_CARE_PROVIDER_SITE_OTHER): Payer: Medicare Other

## 2023-02-17 DIAGNOSIS — Z0000 Encounter for general adult medical examination without abnormal findings: Secondary | ICD-10-CM | POA: Diagnosis not present

## 2023-02-17 NOTE — Progress Notes (Signed)
Subjective:   Jim Carson is a 72 y.o. male who presents for Medicare Annual/Subsequent preventive examination.  Visit Complete: Virtual  I connected with  Percival Spanish on 02/17/23 by a audio enabled telemedicine application and verified that I am speaking with the correct person using two identifiers.  Patient Location: Home  Provider Location: Office/Clinic  I discussed the limitations of evaluation and management by telemedicine. The patient expressed understanding and agreed to proceed.  Vital Signs: Unable to obtain new vitals due to this being a telehealth visit.  Review of Systems     Cardiac Risk Factors include: advanced age (>69men, >87 women);hypertension;male gender     Objective:    Today's Vitals   02/17/23 1326  PainSc: 3    There is no height or weight on file to calculate BMI.     02/17/2023    1:32 PM 12/10/2022   12:47 PM 02/28/2022    1:09 PM 09/10/2021    5:56 PM 08/22/2021   11:10 AM 01/15/2021    8:04 AM 12/22/2020    1:44 PM  Advanced Directives  Does Patient Have a Medical Advance Directive? Yes Yes Yes Yes Yes Yes Yes  Type of Estate agent of [place];Living will Healthcare Power of [place];Living will Healthcare Power of [place];Living will Healthcare Power of [employer] of [place];Living will Healthcare Power of [place];Living will Healthcare Power of [place];Living will  Does patient want to make changes to medical advance directive?   No - Patient declined No - Patient declined   No - Patient declined  Copy of Healthcare Power of Attorney in Chart? No - copy requested   No - copy requested  No - copy requested     Current Medications (verified) Outpatient Encounter Medications as of 02/17/2023  Medication Sig   Amantadine HCl 100 MG tablet    aspirin EC 81 MG tablet Take by mouth.   carbidopa-levodopa (SINEMET IR) 25-100 MG tablet Take 3.5 tablets by mouth 3 (three) times daily.    Carbidopa-Levodopa ER (SINEMET CR) 25-100 MG tablet controlled release Take 1 tablet by mouth at bedtime.    Cholecalciferol (VITAMIN D3) 125 MCG (5000 UT) CAPS Take 5,000 Units by mouth in the morning.   desonide (DESOWEN) 0.05 % ointment Apply 1 application topically daily as needed (irritation).   diclofenac Sodium (VOLTAREN) 1 % GEL Apply 4 g topically 4 (four) times daily as needed.   docusate sodium (COLACE) 100 MG capsule Take 100 mg by mouth daily as needed for moderate constipation.   hydrochlorothiazide (HYDRODIURIL) 25 MG tablet Take 25 mg by mouth in the morning.   ipratropium (ATROVENT) 0.06 % nasal spray Place 2 sprays into both nostrils 2 (two) times daily as needed for rhinitis.   levothyroxine (SYNTHROID, LEVOTHROID) 75 MCG tablet Take 75 mcg by mouth daily before breakfast.   linaclotide (LINZESS) 290 MCG CAPS capsule Take 290 mcg by mouth daily as needed (constipation).   losartan (COZAAR) 100 MG tablet Take 1 tablet (100 mg total) by mouth in the morning.   MAGNESIUM PO Take by mouth.   pramipexole (MIRAPEX) 0.5 MG tablet Take 0.5 mg by mouth in the morning and at bedtime.   vitamin B-12 (CYANOCOBALAMIN) 1000 MCG tablet Take 1,000 mcg by mouth daily.   methocarbamol (ROBAXIN) 500 MG tablet Take 1 tablet (500 mg total) by mouth every 6 (six) hours as needed for muscle spasms. (Patient not taking: Reported on 02/17/2023)   No facility-administered encounter medications on  file as of 02/17/2023.    Allergies (verified) Other   History: Past Medical History:  Diagnosis Date   Abnormal gait    due to parkinson's,  uses cane   Allergic rhinitis    Allergy [place] 1998   Allergic to dustmits, dog hair and roaches.   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 Lorin Picket  @[facility]   Pneumonia    Sleep apnea    Vitamin B12 deficiency    Vitamin D deficiency    Wears glasses    Past Surgical History:  Procedure Laterality Date   APPENDECTOMY  07/1964   JOINT REPLACEMENT  March 2019 and again on 09/10/2021   Left hip twice.  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   @[facility] ,  [place]   desmoid fibromatosis   SHOULDER ARTHROSCOPY WITH ROTATOR CUFF REPAIR Left 12/15/2018   Procedure: SHOULDER ARTHROSCOPY, biceps tenodesis labored Debridement, submacromial decompression;  Surgeon: Eugenia Mcalpine, MD;  Location: [facility];  Service: Orthopedics;  Laterality: Left;  interscalene block   SPINE SURGERY     TOTAL HIP ARTHROPLASTY Left 09/18/2017   @[facility] , [place]   TOTAL HIP REVISION Left 09/10/2021   Procedure: Left hip acetabular versus total hip arthroplasty revision, constrained liner;  Surgeon: Ollen Gross, MD;  Location: [facility];  Service: Orthopedics;  Laterality: Left;   Family History  Problem Relation Age of Onset   Heart disease Mother    Alcohol abuse Father    Prostate cancer Brother    Cancer Brother    Social History   Socioeconomic History   Marital status: Married    Spouse name: Not on file   Number of children: Not on file   Years of education: Not on file   Highest education level: Not on file  Occupational History   Not on  file  Tobacco Use   Smoking status: Never   Smokeless tobacco: Never  Vaping Use   Vaping status: Never Used  Substance and Sexual Activity   Alcohol use: Yes    Alcohol/week: 8.0 standard drinks of alcohol    Types: 7 Glasses of wine, 1 Cans of beer per week    Comment: I drink a glass of wine with diner.  I like beer with pizza   Drug use: Never   Sexual activity: Yes    Birth control/protection: None  Other Topics Concern   Not on file  Social History Narrative   ** Merged History Encounter **       Social Determinants of  Health   Financial Resource Strain: Low Risk  (02/17/2023)   Overall Financial Resource Strain (CARDIA)    Difficulty of Paying Living Expenses: Not hard at all  Food Insecurity: No Food Insecurity (02/17/2023)   Hunger Vital Sign    Worried About Running Out of Food in the Last Year: Never true    Ran Out of Food in the Last Year: Never true  Transportation Needs: No Transportation Needs (02/17/2023)   PRAPARE - Administrator, Civil Service (Medical): No    Lack of Transportation (Non-Medical): No  Physical Activity: Sufficiently Active (02/17/2023)   Exercise Vital Sign    Days of Exercise per Week: 6 days    Minutes of Exercise per Session: 60 min  Stress: No Stress Concern Present (02/17/2023)   [employer] of Occupational Health - Occupational Stress Questionnaire    Feeling of Stress : Not at all  Social Connections: Socially Integrated (02/17/2023)   Social Connection and Isolation Panel [NHANES]    Frequency of Communication with Friends and Family: More than three times a week    Frequency of Social Gatherings with Friends and Family: Twice a week    Attends Religious Services: More than 4 times per year    Active Member of [employer] or Organizations: Yes    Attends Engineer, structural: More than 4 times per year    Marital Status: Married    Tobacco Counseling Counseling given: Not Answered   Clinical  Intake:  Pre-visit preparation completed: Yes  Pain : 0-10 Pain Score: 3  Pain Type: Chronic pain Pain Location: Back Pain Orientation: Lower Pain Descriptors / Indicators: Aching Pain Onset: More than a month ago Pain Frequency: Intermittent     Nutritional Risks: None Diabetes: No  How often do you need to have someone help you when you read instructions, pamphlets, or other written materials from your doctor or pharmacy?: 1 - Never  Interpreter Needed?: No  Information entered by :: NAllen LPN   Activities of Daily Living    02/17/2023    1:27 PM  In your present state of health, do you have any difficulty performing the following activities:  Hearing? 0  Vision? 0  Difficulty concentrating or making decisions? 0  Walking or climbing stairs? 0  Comment parkinson's  Dressing or bathing? 0  Doing errands, shopping? 0  Preparing Food and eating ? N  Using the Toilet? N  In the past six months, have you accidently leaked urine? N  Do you have problems with loss of bowel control? N  Managing your Medications? N  Managing your Finances? N  Housekeeping or managing your Housekeeping? N    Patient Care Team: Garnette Gunner, MD as PCP - General (Family Medicine)  Indicate any recent Medical Services you may have received from other than [facility] providers in the past year (date may be approximate).     Assessment:   This is a routine wellness examination for Luqman.  Hearing/Vision screen Hearing Screening - Comments:: Denies hearing issues Vision Screening - Comments:: Regular eye exams,   Dietary issues and exercise activities discussed:     Goals Addressed             This Visit's Progress    Patient Stated       02/17/2023, wants to improve balance to walk with cane agai n       Depression Screen    02/17/2023    1:33 PM 12/30/2022  1:25 PM  PHQ 2/9 Scores  PHQ - 2 Score 0 0  PHQ- 9 Score 0 3    Fall Risk    02/17/2023    1:32 PM  12/30/2022    1:25 PM  Fall Risk   Falls in the past year? 1 1  Number falls in past yr: 0 1  Injury with Fall? 0 0  Risk for fall due to : Impaired balance/gait;Medication side effect;Impaired mobility;History of fall(s)   Follow up Falls prevention discussed;Falls evaluation completed     MEDICARE RISK AT HOME: Medicare Risk at Home Any stairs in or around the home?: Yes If so, are there any without handrails?: No Home free of loose throw rugs in walkways, pet beds, electrical cords, etc?: Yes Adequate lighting in your home to reduce risk of falls?: Yes Life alert?: No Use of a cane, walker or w/c?: Yes Grab bars in the bathroom?: Yes Shower chair or bench in shower?: Yes Elevated toilet seat or a handicapped toilet?: No  TIMED UP AND GO:  Was the test performed?  No    Cognitive Function:        02/17/2023    1:34 PM  6CIT Screen  What Year? 0 points  What month? 0 points  What time? 0 points  Count back from 20 0 points  Months in reverse 0 points  Repeat phrase 4 points  Total Score 4 points    Immunizations Immunization History  Administered Date(s) Administered   Covid-19, Mrna,Vaccine(Spikevax)66yrs and older 06/03/2022   Fluad Quad(high Dose 65+) 03/14/2021   Gamma Globulin 03/08/1993, 07/24/1993, 11/21/1993, 05/20/1994   Influenza Inj Mdck Quad Pf 03/20/2022   Influenza Split 02/22/2019, 02/24/2019   Influenza, High Dose Seasonal PF 04/13/2018, 03/09/2020, 03/14/2021   Influenza,inj,Quad PF,6+ Mos 03/03/2014, 03/24/2015, 04/02/2016, 04/22/2017, 02/24/2019   Influenza,inj,Quad PF,6-35 Mos 02/24/2019   Influenza,inj,quad, With Preservative 02/22/2019   Influenza-Unspecified 03/03/2014, 03/24/2015, 04/02/2016, 04/22/2017, 04/13/2018, 02/22/2019, 02/24/2019   PFIZER Comirnaty(Gray Top)Covid-19 Tri-Sucrose Vaccine 10/14/2020, 03/29/2021   PFIZER(Purple Top)SARS-COV-2 Vaccination 07/29/2019, 08/24/2019, 03/27/2020   PNEUMOCOCCAL CONJUGATE-20 03/27/2021    Pneumococcal Conjugate,unspecified 03/03/2014, 03/24/2015   Pneumococcal Conjugate-13 03/03/2014, 03/24/2015   Pneumococcal Polysaccharide-23 03/03/2014, 03/24/2015   Pneumococcal-Unspecified 03/03/2014, 03/24/2015   Polio, Unspecified 07/12/1954, 08/23/1954, 09/27/1955, 11/23/1959   Respiratory Syncytial Virus Vaccine,Recomb Aduvanted(Arexvy) 06/04/2022   Smallpox 10/04/1954   Td 10/04/1954, 03/08/1993   Tdap 12/10/2012, 07/24/2020   Unspecified SARS-COV-2 Vaccination 03/27/2020   Zoster Recombinant(Shingrix) 11/14/2017, 02/26/2018, 02/27/2018, 02/22/2019   Zoster, Live 12/30/2011   Zoster, Unspecified 11/14/2017, 02/26/2018, 02/27/2018, 02/22/2019    TDAP status: Up to date  Flu Vaccine status: Due, Education has been provided regarding the importance of this vaccine. Advised may receive this vaccine at local pharmacy or Health Dept. Aware to provide a copy of the vaccination record if obtained from local pharmacy or Health Dept. Verbalized acceptance and understanding.  Pneumococcal vaccine status: Up to date  Covid-19 vaccine status: Completed vaccines  Qualifies for Shingles Vaccine? Yes   Zostavax completed Yes   Shingrix Completed?: Yes  Screening Tests Health Maintenance  Topic Date Due   Hepatitis C Screening  Never done   COVID-19 Vaccine (8 - 2023-24 season) 10/03/2022   INFLUENZA VACCINE  01/30/2023   Medicare Annual Wellness (AWV)  02/17/2024   DTaP/Tdap/Td (4 - Td or Tdap) 07/24/2030   Colonoscopy  08/18/2030   Pneumonia Vaccine 21+ Years old  Completed   Zoster Vaccines- Shingrix  Completed   HPV VACCINES  Aged Out  Health Maintenance  Health Maintenance Due  Topic Date Due   Hepatitis C Screening  Never done   COVID-19 Vaccine (8 - 2023-24 season) 10/03/2022   INFLUENZA VACCINE  01/30/2023    Colorectal cancer screening: Type of screening: Colonoscopy. Completed 08/18/2020. Repeat every 10 years  Lung Cancer Screening: (Low Dose CT Chest  recommended if Age 67-80 years, 20 pack-year currently smoking OR have quit w/in 15years.) does not qualify.   Lung Cancer Screening Referral: no  Additional Screening:  Hepatitis C Screening: does qualify;   Vision Screening: Recommended annual ophthalmology exams for early detection of glaucoma and other disorders of the eye. Is the patient up to date with their annual eye exam?  Yes  Who is the provider or what is the name of the office in which the patient attends annual eye exams? Can't remember name If pt is not established with a provider, would they like to be referred to a provider to establish care? No .   Dental Screening: Recommended annual dental exams for proper oral hygiene  Diabetic Foot Exam: n/a  Community Resource Referral / Chronic Care Management: CRR required this visit?  No   CCM required this visit?  No     Plan:     I have personally reviewed and noted the following in the patient's chart:   Medical and social history Use of alcohol, tobacco or illicit drugs  Current medications and supplements including opioid prescriptions. Patient is not currently taking opioid prescriptions. Functional ability and status Nutritional status Physical activity Advanced directives List of other physicians Hospitalizations, surgeries, and ER visits in previous 12 months Vitals Screenings to include cognitive, depression, and falls Referrals and appointments  In addition, I have reviewed and discussed with patient certain preventive protocols, quality metrics, and best practice recommendations. A written personalized care plan for preventive services as well as general preventive health recommendations were provided to patient.     Barb Merino, LPN   1/61/0960   After Visit Summary: (MyChart) Due to this being a telephonic visit, the after visit summary with patients personalized plan was offered to patient via MyChart   Nurse Notes: none

## 2023-02-17 NOTE — Patient Instructions (Signed)
Mr. Jim Carson , Thank you for taking time to come for your Medicare Wellness Visit. I appreciate your ongoing commitment to your health goals. Please review the following plan we discussed and let me know if I can assist you in the future.   Referrals/Orders/Follow-Ups/Clinician Recommendations: none  This is a list of the screening recommended for you and due dates:  Health Maintenance  Topic Date Due   Hepatitis C Screening  Never done   COVID-19 Vaccine (8 - 2023-24 season) 10/03/2022   Flu Shot  01/30/2023   Medicare Annual Wellness Visit  02/17/2024   DTaP/Tdap/Td vaccine (4 - Td or Tdap) 07/24/2030   Colon Cancer Screening  08/18/2030   Pneumonia Vaccine  Completed   Zoster (Shingles) Vaccine  Completed   HPV Vaccine  Aged Out    Advanced directives: (Copy Requested) Please bring a copy of your health care power of attorney and living will to the office to be added to your chart at your convenience.  Next Medicare Annual Wellness Visit scheduled for next year: Yes  Preventive Care 74 Years and Older, Male  Preventive care refers to lifestyle choices and visits with your health care provider that can promote health and wellness. What does preventive care include? A yearly physical exam. This is also called an annual well check. Dental exams once or twice a year. Routine eye exams. Ask your health care provider how often you should have your eyes checked. Personal lifestyle choices, including: Daily care of your teeth and gums. Regular physical activity. Eating a healthy diet. Avoiding tobacco and drug use. Limiting alcohol use. Practicing safe sex. Taking low doses of aspirin every day. Taking vitamin and mineral supplements as recommended by your health care provider. What happens during an annual well check? The services and screenings done by your health care provider during your annual well check will depend on your age, overall health, lifestyle risk factors, and family  history of disease. Counseling  Your health care provider may ask you questions about your: Alcohol use. Tobacco use. Drug use. Emotional well-being. Home and relationship well-being. Sexual activity. Eating habits. History of falls. Memory and ability to understand (cognition). Work and work Astronomer. Screening  You may have the following tests or measurements: Height, weight, and BMI. Blood pressure. Lipid and cholesterol levels. These may be checked every 5 years, or more frequently if you are over 41 years old. Skin check. Lung cancer screening. You may have this screening every year starting at age 47 if you have a 30-pack-year history of smoking and currently smoke or have quit within the past 15 years. Fecal occult blood test (FOBT) of the stool. You may have this test every year starting at age 47. Flexible sigmoidoscopy or colonoscopy. You may have a sigmoidoscopy every 5 years or a colonoscopy every 10 years starting at age 67. Prostate cancer screening. Recommendations will vary depending on your family history and other risks. Hepatitis C blood test. Hepatitis B blood test. Sexually transmitted disease (STD) testing. Diabetes screening. This is done by checking your blood sugar (glucose) after you have not eaten for a while (fasting). You may have this done every 1-3 years. Abdominal aortic aneurysm (AAA) screening. You may need this if you are a current or former smoker. Osteoporosis. You may be screened starting at age 88 if you are at high risk. Talk with your health care provider about your test results, treatment options, and if necessary, the need for more tests. Vaccines  Your health  care provider may recommend certain vaccines, such as: Influenza vaccine. This is recommended every year. Tetanus, diphtheria, and acellular pertussis (Tdap, Td) vaccine. You may need a Td booster every 10 years. Zoster vaccine. You may need this after age 64. Pneumococcal  13-valent conjugate (PCV13) vaccine. One dose is recommended after age 65. Pneumococcal polysaccharide (PPSV23) vaccine. One dose is recommended after age 70. Talk to your health care provider about which screenings and vaccines you need and how often you need them. This information is not intended to replace advice given to you by your health care provider. Make sure you discuss any questions you have with your health care provider. Document Released: 07/14/2015 Document Revised: 03/06/2016 Document Reviewed: 04/18/2015 Elsevier Interactive Patient Education  2017 [employer].  Fall Prevention in the Home Falls can cause injuries. They can happen to people of all ages. There are many things you can do to make your home safe and to help prevent falls. What can I do on the outside of my home? Regularly fix the edges of walkways and driveways and fix any cracks. Remove anything that might make you trip as you walk through a door, such as a raised step or threshold. Trim any bushes or trees on the path to your home. Use bright outdoor lighting. Clear any walking paths of anything that might make someone trip, such as rocks or tools. Regularly check to see if handrails are loose or broken. Make sure that both sides of any steps have handrails. Any raised decks and porches should have guardrails on the edges. Have any leaves, snow, or ice cleared regularly. Use sand or salt on walking paths during winter. Clean up any spills in your garage right away. This includes oil or grease spills. What can I do in the bathroom? Use night lights. Install grab bars by the toilet and in the tub and shower. Do not use towel bars as grab bars. Use non-skid mats or decals in the tub or shower. If you need to sit down in the shower, use a plastic, non-slip stool. Keep the floor dry. Clean up any water that spills on the floor as soon as it happens. Remove soap buildup in the tub or shower regularly. Attach  bath mats securely with double-sided non-slip rug tape. Do not have throw rugs and other things on the floor that can make you trip. What can I do in the bedroom? Use night lights. Make sure that you have a light by your bed that is easy to reach. Do not use any sheets or blankets that are too big for your bed. They should not hang down onto the floor. Have a firm chair that has side arms. You can use this for support while you get dressed. Do not have throw rugs and other things on the floor that can make you trip. What can I do in the kitchen? Clean up any spills right away. Avoid walking on wet floors. Keep items that you use a lot in easy-to-reach places. If you need to reach something above you, use a strong step stool that has a grab bar. Keep electrical cords out of the way. Do not use floor polish or wax that makes floors slippery. If you must use wax, use non-skid floor wax. Do not have throw rugs and other things on the floor that can make you trip. What can I do with my stairs? Do not leave any items on the stairs. Make sure that there are handrails on  both sides of the stairs and use them. Fix handrails that are broken or loose. Make sure that handrails are as long as the stairways. Check any carpeting to make sure that it is firmly attached to the stairs. Fix any carpet that is loose or worn. Avoid having throw rugs at the top or bottom of the stairs. If you do have throw rugs, attach them to the floor with carpet tape. Make sure that you have a light switch at the top of the stairs and the bottom of the stairs. If you do not have them, ask someone to add them for you. What else can I do to help prevent falls? Wear shoes that: Do not have high heels. Have rubber bottoms. Are comfortable and fit you well. Are closed at the toe. Do not wear sandals. If you use a stepladder: Make sure that it is fully opened. Do not climb a closed stepladder. Make sure that both sides of the  stepladder are locked into place. Ask someone to hold it for you, if possible. Clearly mark and make sure that you can see: Any grab bars or handrails. First and last steps. Where the edge of each step is. Use tools that help you move around (mobility aids) if they are needed. These include: Canes. Walkers. Scooters. Crutches. Turn on the lights when you go into a dark area. Replace any light bulbs as soon as they burn out. Set up your furniture so you have a clear path. Avoid moving your furniture around. If any of your floors are uneven, fix them. If there are any pets around you, be aware of where they are. Review your medicines with your doctor. Some medicines can make you feel dizzy. This can increase your chance of falling. Ask your doctor what other things that you can do to help prevent falls. This information is not intended to replace advice given to you by your health care provider. Make sure you discuss any questions you have with your health care provider. Document Released: 04/13/2009 Document Revised: 11/23/2015 Document Reviewed: 07/22/2014 Elsevier Interactive Patient Education  2017 [employer].

## 2023-02-18 ENCOUNTER — Telehealth: Payer: Self-pay | Admitting: Family Medicine

## 2023-02-18 DIAGNOSIS — M1612 Unilateral primary osteoarthritis, left hip: Secondary | ICD-10-CM

## 2023-02-18 NOTE — Telephone Encounter (Signed)
02/18/23 - Pt's Wife, [name] ,called asking to speak with Yolanda. She wants a call back at [phone].

## 2023-02-18 NOTE — Telephone Encounter (Signed)
Patient's wife states that pt has lower back pain at right hip and he normally see's [facility], but they can't see patient until September 2nd. She stated that he would need a referral to see [facility] in [address],  [place], [place]. Fax number [phone] and they can see him as soon as tomorrow. Would an appointment be required or can the referral be sent?

## 2023-02-18 NOTE — Telephone Encounter (Signed)
Spouse is aware and verbalized understanding.

## 2023-03-05 ENCOUNTER — Ambulatory Visit: Payer: Medicare Other | Attending: Neurology

## 2023-03-05 DIAGNOSIS — R471 Dysarthria and anarthria: Secondary | ICD-10-CM | POA: Insufficient documentation

## 2023-03-05 DIAGNOSIS — R1312 Dysphagia, oropharyngeal phase: Secondary | ICD-10-CM | POA: Insufficient documentation

## 2023-03-05 NOTE — Therapy (Signed)
OUTPATIENT SPEECH LANGUAGE PATHOLOGY PARKINSON'S TREATMENT/renewal-discharge   Patient Name: Jim Carson MRN: [id number] DOB:Aug 16, 1950, 72 y.o., male Today's Date: 03/05/2023  PCP: Malka So., MD REFERRING PROVIDER: Carrie Mew, MD  END OF SESSION:  End of Session - 03/05/23 1519     Visit Number 13    Number of Visits 17    Date for SLP Re-Evaluation 03/05/23    SLP Start Time 1022    SLP Stop Time  1102    SLP Time Calculation (min) 40 min    Activity Tolerance Patient tolerated treatment well                 Past Medical History:  Diagnosis Date   Abnormal gait    due to parkinson's,  uses cane   Allergic rhinitis    Allergy Spring 1998   Allergic to dustmits, dog hair and roaches.   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 Lorin Picket  @[facility]   Pneumonia    Sleep apnea    Vitamin B12 deficiency    Vitamin D deficiency    Wears glasses    Past Surgical History:  Procedure Laterality Date   APPENDECTOMY  07/1964   JOINT REPLACEMENT  March 2019 and again on 09/10/2021   Left hip twice.  2nd time due to 3 dislocations   LAPAROSCOPIC CHOLECYSTECTOMY  01-21-2017   @[facility] , [place]   POSTERIOR FUSION CERVICAL SPINE  08-15-2011   @[facility] , [place]   w/ laminectomy and decompression-- C3 -- T1   POSTERIOR LAMINECTOMY / DECOMPRESSION LUMBAR SPINE  07-30-2013   @[facility] , [place]   bilateral T11 - 12,  L2 --5   REMOVAL LEFT T1 PARASPINOUS MASS  02-12-2016   @[facility] ,  [place]    desmoid fibromatosis   SHOULDER ARTHROSCOPY WITH ROTATOR CUFF REPAIR Left 12/15/2018   Procedure: SHOULDER ARTHROSCOPY, biceps tenodesis labored Debridement, submacromial decompression;  Surgeon: Eugenia Mcalpine, MD;  Location: [facility];  Service: Orthopedics;  Laterality: Left;  interscalene block   SPINE SURGERY     TOTAL HIP ARTHROPLASTY Left 09/18/2017   @[facility] , [place]   TOTAL HIP REVISION Left 09/10/2021   Procedure: Left hip acetabular versus total hip arthroplasty revision, constrained liner;  Surgeon: Ollen Gross, MD;  Location: [facility];  Service: Orthopedics;  Laterality: Left;   Patient Active Problem List   Diagnosis Date Noted   Drug induced constipation 12/30/2022   Acquired hypothyroidism 12/30/2022   Primary osteoarthritis of left hip 05/18/2021   PD (Parkinson's disease) 05/18/2021   History of gastroesophageal reflux (GERD) 05/18/2021   OSA (obstructive sleep apnea) 05/18/2021   Essential hypertension 05/18/2021  SPEECH THERAPY RENEWAL-DISCHARGE SUMMARY  Visits from Start of Care: 13  Current functional level related to goals / functional outcomes: See below  Pt reports that swallowing has improved with completion of effortful swallows, daily.   Remaining deficits: Dysarthria c/b rushes of speech and  intermittent lower than WNL volume, especially when fatigued.    Education / Equipment: See "today's treatment" section, below   Patient agrees to discharge. Patient goals were partially met. Patient is being discharged due to being pleased with the current functional level..      ONSET DATE: 2013 (script dated 11/22/22)  REFERRING DIAG:  G20.A1 (ICD-10-CM) - Parkinson's disease    THERAPY DIAG:  Dysarthria and anarthria  Dysphagia, oropharyngeal phase  Rationale for Evaluation and Treatment: Rehabilitation  SUBJECTIVE:   SUBJECTIVE STATEMENT:  "I notice a difference (in my swallowing). I have been doing (swallowing HEP)  everyday." Pt accompanied by: self  PERTINENT HISTORY: Patient has past medical history significant for Parkinson's disease diagnosed in 2013 symptomatic starting 2012, gait disturbance, dysphagia, allergic rhinitis, GERD, OSA on CPAP. Prior surgical history includes cervical spine fusion posterior 2013 and patient denies dysphagia following, left hip revision 2020, posterior laminectomy/decompression of lumbar spine, removal left T1 paraspinous mass. Pt had ST course in 2021 which was successful in improving speech loudness, and participation in swallowing exercises.  PAIN:  Are you having pain? Yes: NPRS scale: 2/10 Pain location: lt hip Pain description: sore Aggravating factors: movement Relieving factors: stretch it  FALLS: Has patient fallen in last 6 months?  See PT evaluation for details  PATIENT GOALS: Improve loudness and swallowing  OBJECTIVE:   DIAGNOSTIC FINDINGS:  FINDINGS FROM MBS 12/11/22: (MODIFIED WITH BOLD PRINT) Clinical Impression: Patient swallow function is consistent with prior modified barium swallow study.  Mild oropharyngeal phase dysphagia continues due to patient's Parkinson's.  Minimal mastication of solids noted despite saying he felt like he chewed well.  Pharyngeal timing of swallow actually appeared improved with swallowing trigger at tongue base, vallecula with pures and piriform sinus with liquids.  Retention mostly vallecular and on the superior surface of epiglottis present due to diminished tongue base retraction and wall motility.  Patient senses retention at the piriform sinus when it is indeed located at the vallecular space. Liquid swallows are most effective to diminish amount of pudding and cracker bolus retained.  Various postures including chin, head turn right, head turn left did not appear to significantly improved pharyngeal clearance.  If any posture was helpful perhaps head turn left may have been slightly helpful.  Cueing patient to expectorate  (cough) was noted to clear vallecular retention that he did want clear with dry swallows.     Motility adequacy of musculature varied during the study with epiglottic deflection better with liquids than with solids.  Patient was able to easily swallow barium tablet with liquid without deficits, with no aspiration or penetration..  Of note he did not cough or clear his throat during entire study while swallowing barium however prior to barium administration patient did clear his throat immediately post and water swallow.     Highly recommend patient follow-up with speech pathologist to establish exercise regimen to improve pharyngeal motility.  Recommend he masticates well, follow solids with liquids, maintain strength of cough, voice, and expectoration for airway protection.  Also provided him information regarding a vacuum device for clearance of aspiration for emergent use.  Thank you for this consult.   PATIENT REPORTED OUTCOME MEASURES (PROM): Question Patient's Response  My swallowing problem has caused me to lose weight 0  2.  My swallowing problem interferes with my ability to go out to meals 0  3.  Swallowing liquids takes extra effort 0  4.  Swallowing solids takes extra effort 2  5.  Swallowing  pills takes extra effort 0  6.  Swallowing is painful 0  7.  The pleasure of eating is affected by my swallowing 1  8.  When I swallow food sticks in my throat 2  9.  I cough when I eat 3  10.  Swallowing is stressful  2    Communication Effectiveness Survey        How effective is your speech in... Pt Rating 12/23/22,03/05/23  Having a conversation with family/friends 2  4  2.   Conversing with strangers 2  4  3.   Talking to someone familiar on the phone 2.5   4  4.   Talking to someone unfamiliar on the phone 2.5  4  5.   Being part of conversation in a noisy environment  2.5   3  6.   Speaking to someone when angry or upset 3    4  7.   Conversing while travelling in the car 2.5   4   8.   Conversing across distance  3   3   TODAY'S TREATMENT:                                                                                                                                         DATE:   SO=Speak Out  03/05/23: CES completed today, pt rated himself 30/32, an improvement from previous measure (20/32, above). Today he was modified independent with SO tasks, and demonstrated 15 minutes conversation with low 70s dB average and WFL/WNL speech rate. He has completed SO once daily since last session.  Pt has been completing effortful swallow daily and reports his swallowing status has improved since initial eval. He did not complete EAT-10 today because of this.    02/05/23: NEEDS CES COMPLETED. SWALLOW: Jim Carson demonstrates 5/5 swallow exercises with independence, reports daily implementation. SLP told him to decr to x3/week to maintain progress and provided rationale. He ate fig bar and drank water and followed all precautions independently. He returned EAT-10 with score of 6, which is an improved score compared to when last administered.  SPEECH: Pt has struggled with being consistent with SO. SLP reiterated daily practice after today's lesson, Lesson 24, at least x5 days/week choosing a lesson from the book or participating on the online practice. He thought he would do best with online practice.   SLP targeted improved vocal quality and increasing intensity through progressively difficulty speech tasks using Speak Out! program, lesson 24. ST lead pt through exercises with averages this date:  M-words: 85dB Sustained "ah" 88 dB Reading (phrases) 82 dB - initial cue for intent 100% of the passage Cognitive speech task 76 dB Conversational task: In conversation throughout the session, pt maintained average volume of low 70s dB.    02/03/23: Needs accuracy with swallow HEP assessed next session. Pt is on Lesson 22 today (did lesson 21 yesterday). Has  not been consistent with SO as  directed, since last session, as he should be on Lesson 24 today, based upon Lesson 19 last session.  SLP targeted improving vocal quality and increasing intensity through progressively difficult speech tasks using Speak Out! program which works with intentionality. SLP lead pt through exercises with the following averages this date:  M-words: 81 dB with initial cues for volume and slowing productions down (initial min cues faded to independent) Sustained "ah" 88 dB with initial min cue (and then independent) for reduction in loudness Glides with average 84 dB with usual min cues for keeping same level of intent on low register and higher register Numbers with average 84dB; initial min cues for intent/slowing rate Reading (connected paragraphs) 83dB with occasional min-mod cues for intentional speech - pt sped up selection  Cognitive speech task 70dB with rare min A for more controlled, CEO speaking Conversational task: Pt req'd min-mod A occasionally with conversational task today - average 70 dB with WNL speech rate.  01/29/23: Pt  has been consistent with SO since last session. Lesson 19 today with pt. SLP targeted improving vocal quality and increasing intensity through progressively difficult speech tasks using Speak Out! program which works with intentionality. SLP lead pt through exercises with the following averages this date:  M-words: 85 dB with initial cues for volume and slowing productions down (initial min cues faded to independent) Sustained "ah" 88 dB with initial min cue (and then independent) for reduction in loudness Glides with average 83 dB with min cues for holding low and high notes and then performing glides Reading (paragraphs) 84 dB with usual min-mod cues for intent Cognitive speech task 74dB Conversational task: Pt again did very well with conversational task today - average 72 dB with WNL speech rate.  01/27/23: SLP explained to pt that ONE lesson per day, BID, (with  second time optional for online session) is the aim with SO. Pt acknowledged understanding. As in "S" pt endorsed difficulty with consistency of SO over the weekend - SLP suggested his second time/day completing SO should be with online due to necessary occasional min-mod cues for glides.  Lesson 17 today with pt. SLP targeted improving vocal quality and increasing intensity through progressively difficult speech tasks using Speak Out! program which works with intentionality. SLP lead pt through exercises with averages this date:  M-words: 83dB with initial cues for connecting the words and for volume (initial min cues faded to independent) Sustained "ah" 85 dB with initial min cue (and then independent) for reduction in loudness Glides with average 83 dB with occasional mod cues for holding low and high notes and then performing glides Reading (phrases) 83 dB Cognitive speech task 74dB Conversational task: Pt did very well with conversational task today role-playing changing an ST appointment - average 72 dB.  01/23/23: Swallowing: Pt indicated he is performing safe swallow strategies with POs. SLP observed pt today with fig bar and water. Pt followed precautions with independence and without any overt s/sx oral or pharyngeal deficits.  Speech: Pt was more consistent with SO lessons since last session. On lesson 13 today, so SLP needs to explain pt does ONE lesson each day. SLP targeted improving vocal quality and increasing intensity through progressively difficulty speech tasks using Speak Out! program which works with intentionality. ST lead pt through exercises with averages this date:  M-words: 79dB with initial cues for connecting the words and for volume (initial mod cues) Sustained "ah" 88 dB with initial rare min  cues for reduction in loudness Glides with average 83 dB with mod cues for holding low and high notes and then performing glides Reading (phrases) 84 dB Cognitive speech task 77  dB Conversational task: Pt req'd occasional cues for using intentional speech for maintaining average 70 dB.  01/20/23: SPEECH: Pt indicated the last lesson he completed was Lesson 6. So SLP assisted pt in completing Lesson 7. SLP asked pt what was th ebarrier to consitency with SP and pt stated he needed to have a specific time each day to complete. SLP worked with pt to find these times and stated that he could begin his first lesson of the day between 10-11AM, and his second lesson between 5-6PM. If this does not work well for pt SLP and pt agreed to shelf SO and do traditional ST for loudness/clarity.  M-words: average 85dB,  Sustained "ah" average 90 dB  Glides - occasional mod faded to rare min cues for hold pitch at bottom and top, average 83 dB Numbers - rare min cues for "swinging" numbers instead of separate voiced productions- average 84 dB Reading (phrases) 82 dB Conversation task role playing ordering apple pie- average of 72 dB  Conversation going to and from ST room was 100% intelligible however was with average upper 60s dB SWALLOW: Pt completed his swallow HEP with SBA.  01/15/23: Given "S", if pt consistency with SO does not improve SLP may have to abandon the program and modify plan for using traditional dysarthria therapy. SLP told pt he should complete lessons on his own at home, and he and SLP should be doing lesson 10 on Monday when he arrives for ST.  SPEECH: SLP worked with patient with SO lesson 5. Some tasks were modified for reduced breath support.  M-words: average 86dB, with occasional min cue to slow production and decr volume Sustained "ah" average 90 dB with initial cue to decr volume and open mouth Glides - usual max faded to occasional mod-max cues for hold pitch at bottom and top, average 82 dB Numbers - initial min cues for "swinging" numbers instead of separate voiced productions- average 85 dB Reading (phrases) 84 dB Conversation task role playing MD office-  again, average of 72 dB  Conversation going to and from ST room was 100% intelligible.   01/06/23: SPEECH: Given "s", SLP reiterated daily practice of SO lesson x2, or once with daily lesson and once with online practice. SLP repeated this x3 during today's session. Pt told SLP he would complete online session today, and cont with subsequent lesson + online practice. SLP targeted improving vocal quality and increasing intensity through progressively difficulty speech tasks using Speak Out! program, lesson 3. ST lead pt through exercises providing occasional model with min-mod-A required to achieve target dB this date. Averages this date:  M-words: 88dB, with initial cue to slow production and decr volume Sustained "ah" 87 dB initial cue to decr volume Glides - usual max faded to occasional mod-max cues for hold pitch at bottom and top, average 83 dB Numbers - mod-max initial cues for "swinging" numbers instead of separate production- average 85 dB Reading (phrases) 82 dB Conversation task - average 72 dB Speech between tasks was sub-WNL. This improved after the reading and conversation tasks.   01/01/23: SWALLOW: pt demonstrates 5/5 swallow exercises with independence, reports to daily implementation. Jim Carson continues to use tally marks to ensure completion of all exercises.  SPEECH: SLP inquired about pt's consistency with SO, pt acknowledged he has not been  consistent. SLP educated pt that if he is finding it difficult to complete daily that he needs to take a break and return to ST when he can complete daily. SLP reiterated daily practice lesson x2, or once with daily lesson and once with online practice. Showed pt webpage with online practice for him to navigate to on his own if desired. Pt told SLP he would complete today and subsequent days x2 as directed.  SLP targeted improving vocal quality and increasing intensity through progressively difficulty speech tasks using Speak Out! program, lesson 2.  ST lead pt through exercises providing occasional model with min-mod-A required to achieve target dB this date. Averages this date:  M-words: 85dB Sustained "ah" 88 dB Reading (phrases) 82 dB Cognitive speech task 78 dB Conversational task: Conversational sample of approx 3 minutes, pt averaged 71 dB with occasional min A.  Benefiting from occasional min -mod A for improved breath support during warm up tasks, though some tasks were modified to accommodate decreased breath support. Goal is to complete warm up exercises of speak out daily before next session.   12/23/22: SWALLOW: pt demonstrates 5/5 swallow exercises with mod-I, reports to daily implementation. Using tally marks to ensure completion of all exercises.  SPEECH: SLP re-introduced pt to speak out, explained intentional speech and it's role  Target improving vocal quality and increasing intensity through progressively difficulty speech tasks using Speak Out! program, lesson 1. ST leads pt through exercises providing usual model prior to pt execution. usual mod-A required to achieve target dB this date. Averages this date:  Sustained "ah" 91 dB Reading (phrases) 80 dB Cognitive speech task 78 dB Conversational task:  Benefiting from tactile cues for improved breath support during warm up tasks, though  some tasks were modified to accommodate cognitive load and decreased breath support. Conversational sample of approx 5 minutes, pt averages 71 dB with occasional min-A. Goal is to complete warm up exercises of speak out daily between this and next session.    12/18/22: SWALLOW: reviewed pt's dysphagia HEP. SLP had to re-educate pt about procedure for [facility]. SLP taught pt the alternate to [employer] (CTAR - "chin pushback" on pt's sheet) in previous session due to pt's cervical/back issues.  SPEECH: SLP ordered pt Speak Out book today. Introduced pt to [employer] everyday tasks May-me-my-moe-moo, ah, and glide. Pt with difficulty with breath  support due to other dx's. Cue to "Be intentional with your air" was helpful but pt had to rush May-Moo, and with glide req'd cues for starting hi -lo glide at roughly the same Hz he ended on with lo-hi glide. Pt was independent by session end. Told pt to complete these tasks BID in prep for start of Speak Out.  12/16/22 (eval): SLP reviewed results from [facility] and provided precautions and HEP.   PATIENT EDUCATION: Education details: See "pt instructions" and "today's treatment". Person educated: Patient Education method: one or more of: Explanation, Demonstration, Verbal cues, and Handouts Education comprehension: One or more of : verbalized understanding, returned demonstration, verbal cues required, and needs further education   GOALS: Goals reviewed with patient? Yes  SHORT TERM GOALS: Target date: 01/24/23 (modified due to visit number)  Pt will complete dysphagia HEP with modified independence x2 sessions Baseline: 01/01/23 Goal status: MET  2.  Pt will perform thorough mastication and alternate bite/sip precautions with POs during session with modified independence in 2 sessions Baseline: 01/23/23 Goal status: Partially met  3.  Pt will complete vocal warm up tasks (ah,  glides, counting, etc) with rare min A in 2 sessions Baseline: 01/20/23 Goal status: Partially met  4.  Jim Carson will improve speech volume in 5 minutes simple conversation to low 70s dB in 3 sessions Baseline:  Goal status: Not met  5.  Pt will demonstrate completion of home exercises as prescribed 90% compliance in first 2 weeks Baseline:  Goal status: NOT MET   LONG TERM GOALS: Target date: 02/14/23, 03/05/23  Pt will complete dysphagia HEP with independence x3 sessions Baseline: 01/20/23, 02/05/23 Goal status: Met  2.  Pt will perform thorough mastication and alternate bite/sip precautions with POs during session with independence in 3 sessions Baseline: 01/23/23, 02/05/23 Goal status: partially met  3.  Pt will  complete vocal warm up tasks (ah, glides, counting, etc) with modified independence in 3 sessions Baseline: 02/05/23, 03/05/23 Goal status: partially met  4.  Jim Carson will improve speech volume in 10 minutes simple-mod complex conversation to low 70s dB in 3 sessions Baseline: 02/05/23, 03/05/23 Goal status: partially met  5.  In last 1-2 sessions, both of Bobby's PROM scores will improve compared to initial administration Baseline:  Goal status: Met  6.  Jim Carson will tell SLP what HEP to perform in order to maintain WNL volume speech over time Baseline:  Goal status: MET  ASSESSMENT:  CLINICAL IMPRESSION: Patient seen today to enforce LTGs. After that time he will be d/c'd. He is a 72 y.o. M who was seen again today for treatment of voice (reduced voice volume), and cont work with HEP for dysphagia. He is more consistently using louder speech compared to before this therapy course, but has more difficulty with intent when cognitive demand is greater. See "today's treatment" for details of today's session. Today his speech throughout session was average 70dB. SLP and pt agreed discharge is appropriate today. He will request ST script if he feels he needs follow up ST in January when completing PT evaluation. If pt decides he does not require ST follow up in January, pt should be screened or evaluated for ST at his next follow up (after the January PT evaluation and/or PT therapy course).  OBJECTIVE IMPAIRMENTS: Objective impairments include dysarthria and dysphagia. These impairments are limiting patient from household responsibilities, ADLs/IADLs, effectively communicating at home and in community, and safety when swallowing.Factors affecting potential to achieve goals and functional outcome are  none noted .Marland Kitchen Patient will benefit from skilled SLP services to address above impairments and improve overall function.  REHAB POTENTIAL: Good  PLAN:  SLP FREQUENCY: 2x/week  SLP DURATION: 8  weeks  PLANNED INTERVENTIONS: Aspiration precaution training, Pharyngeal strengthening exercises, Diet toleration management , Environmental controls, Internal/external aids, Oral motor exercises, Functional tasks, SLP instruction and feedback, Compensatory strategies, and Patient/family education    [facility], CCC-SLP 03/05/2023, 3:20 PM

## 2023-03-07 ENCOUNTER — Encounter: Payer: Self-pay | Admitting: Family Medicine

## 2023-03-13 ENCOUNTER — Encounter: Payer: Self-pay | Admitting: Family Medicine

## 2023-03-13 ENCOUNTER — Ambulatory Visit: Payer: Medicare Other | Admitting: Family Medicine

## 2023-03-13 VITALS — BP 118/68 | HR 47 | Temp 96.4°F | Wt 197.0 lb

## 2023-03-13 DIAGNOSIS — Z8042 Family history of malignant neoplasm of prostate: Secondary | ICD-10-CM | POA: Diagnosis not present

## 2023-03-13 DIAGNOSIS — I10 Essential (primary) hypertension: Secondary | ICD-10-CM | POA: Diagnosis not present

## 2023-03-13 DIAGNOSIS — Z87898 Personal history of other specified conditions: Secondary | ICD-10-CM | POA: Diagnosis not present

## 2023-03-13 DIAGNOSIS — I440 Atrioventricular block, first degree: Secondary | ICD-10-CM | POA: Insufficient documentation

## 2023-03-13 DIAGNOSIS — Z131 Encounter for screening for diabetes mellitus: Secondary | ICD-10-CM | POA: Diagnosis not present

## 2023-03-13 DIAGNOSIS — K5903 Drug induced constipation: Secondary | ICD-10-CM | POA: Diagnosis not present

## 2023-03-13 DIAGNOSIS — D696 Thrombocytopenia, unspecified: Secondary | ICD-10-CM

## 2023-03-13 DIAGNOSIS — D649 Anemia, unspecified: Secondary | ICD-10-CM | POA: Insufficient documentation

## 2023-03-13 DIAGNOSIS — G20B1 Parkinson's disease with dyskinesia, without mention of fluctuations: Secondary | ICD-10-CM | POA: Diagnosis not present

## 2023-03-13 DIAGNOSIS — E039 Hypothyroidism, unspecified: Secondary | ICD-10-CM

## 2023-03-13 DIAGNOSIS — D518 Other vitamin B12 deficiency anemias: Secondary | ICD-10-CM | POA: Diagnosis not present

## 2023-03-13 DIAGNOSIS — G4733 Obstructive sleep apnea (adult) (pediatric): Secondary | ICD-10-CM

## 2023-03-13 DIAGNOSIS — Z125 Encounter for screening for malignant neoplasm of prostate: Secondary | ICD-10-CM

## 2023-03-13 DIAGNOSIS — M1612 Unilateral primary osteoarthritis, left hip: Secondary | ICD-10-CM

## 2023-03-13 HISTORY — DX: Anemia, unspecified: D64.9

## 2023-03-13 LAB — CBC WITH DIFFERENTIAL/PLATELET
Basophils Absolute: 0 10*3/uL (ref 0.0–0.1)
Basophils Relative: 0.9 % (ref 0.0–3.0)
Eosinophils Absolute: 0.2 10*3/uL (ref 0.0–0.7)
Eosinophils Relative: 2.8 % (ref 0.0–5.0)
HCT: 43.1 % (ref 39.0–52.0)
Hemoglobin: 14 g/dL (ref 13.0–17.0)
Lymphocytes Relative: 24.3 % (ref 12.0–46.0)
Lymphs Abs: 1.4 10*3/uL (ref 0.7–4.0)
MCHC: 32.5 g/dL (ref 30.0–36.0)
MCV: 90.5 fl (ref 78.0–100.0)
Monocytes Absolute: 0.5 10*3/uL (ref 0.1–1.0)
Monocytes Relative: 8.3 % (ref 3.0–12.0)
Neutro Abs: 3.6 10*3/uL (ref 1.4–7.7)
Neutrophils Relative %: 63.7 % (ref 43.0–77.0)
Platelets: 143 10*3/uL — ABNORMAL LOW (ref 150.0–400.0)
RBC: 4.77 Mil/uL (ref 4.22–5.81)
RDW: 13.9 % (ref 11.5–15.5)
WBC: 5.6 10*3/uL (ref 4.0–10.5)

## 2023-03-13 LAB — URINALYSIS, ROUTINE W REFLEX MICROSCOPIC
Bilirubin Urine: NEGATIVE
Hgb urine dipstick: NEGATIVE
Leukocytes,Ua: NEGATIVE
Nitrite: NEGATIVE
RBC / HPF: NONE SEEN (ref 0–?)
Specific Gravity, Urine: >=1.03 — AB (ref 1.000–1.030)
Total Protein, Urine: NEGATIVE
Urine Glucose: NEGATIVE
Urobilinogen, UA: 0.2 (ref 0.0–1.0)
pH: 6 (ref 5.0–8.0)

## 2023-03-13 LAB — COMPREHENSIVE METABOLIC PANEL
ALT: 8 U/L (ref 0–53)
AST: 16 U/L (ref 0–37)
Albumin: 4.3 g/dL (ref 3.5–5.2)
Alkaline Phosphatase: 59 U/L (ref 39–117)
BUN: 19 mg/dL (ref 6–23)
CO2: 30 meq/L (ref 19–32)
Calcium: 10.1 mg/dL (ref 8.4–10.5)
Chloride: 104 meq/L (ref 96–112)
Creatinine, Ser: 1 mg/dL (ref 0.40–1.50)
GFR: 74.08 mL/min (ref >60.00)
Glucose, Bld: 87 mg/dL (ref 70–99)
Potassium: 4.8 meq/L (ref 3.5–5.1)
Sodium: 144 meq/L (ref 135–145)
Total Bilirubin: 1.1 mg/dL (ref 0.2–1.2)
Total Protein: 6.8 g/dL (ref 6.0–8.3)

## 2023-03-13 LAB — LIPID PANEL
Cholesterol: 114 mg/dL (ref 0–200)
HDL: 47.3 mg/dL (ref >39.00)
LDL Cholesterol: 52 mg/dL (ref 0–99)
NonHDL: 67.1
Total CHOL/HDL Ratio: 2
Triglycerides: 77 mg/dL (ref 0.0–149.0)
VLDL: 15.4 mg/dL (ref 0.0–40.0)

## 2023-03-13 LAB — MAGNESIUM: Magnesium: 2 mg/dL (ref 1.5–2.5)

## 2023-03-13 LAB — VITAMIN B12: Vitamin B-12: 600 pg/mL (ref 211–911)

## 2023-03-13 LAB — MICROALBUMIN / CREATININE URINE RATIO
Creatinine,U: 138.6 mg/dL
Microalb Creat Ratio: 0.7 mg/g (ref 0.0–30.0)
Microalb, Ur: 1 mg/dL (ref 0.0–1.9)

## 2023-03-13 LAB — TSH: TSH: 1.56 u[IU]/mL (ref 0.35–5.50)

## 2023-03-13 LAB — HEMOGLOBIN A1C: Hgb A1c MFr Bld: 5.5 % (ref 4.6–6.5)

## 2023-03-13 LAB — PSA, MEDICARE: PSA: 3.2 ng/mL (ref 0.10–4.00)

## 2023-03-13 LAB — FERRITIN: Ferritin: 100.8 ng/mL (ref 22.0–322.0)

## 2023-03-13 LAB — FOLATE: Folate: 18.4 ng/mL (ref >5.9)

## 2023-03-13 MED ORDER — LOSARTAN POTASSIUM-HCTZ 100-25 MG PO TABS
1.0000 | ORAL_TABLET | Freq: Every day | ORAL | 3 refills | Status: AC
Start: 2023-03-13 — End: 2024-03-07

## 2023-03-13 NOTE — Assessment & Plan Note (Signed)
Elevated PSA and family history of prostate cancer.  Recheck

## 2023-03-13 NOTE — Assessment & Plan Note (Addendum)
Symptoms currently managed with Sinemet (25/100 long acting at night and 25/100 short acting three and a half tablets three times a day), Pramipexole 0.5mg  twice a day, and Amantadine 100mg  twice a day. Followed by Dr. Christell Constant at [facility] movement disorder clinic. -Continue current medication regimen. -Follow-up with Dr. Christell Constant as needed

## 2023-03-13 NOTE — Assessment & Plan Note (Signed)
History of B12 deficiency with anemia.  Will check anemia labs

## 2023-03-13 NOTE — Progress Notes (Signed)
Assessment/Plan:   Problem List Items Addressed This Visit       Cardiovascular and Mediastinum   Essential hypertension - Primary     Diagnosed in 2005, currently managed with Losartan 100mg  daily and Hydrochlorothiazide 25mg  daily. -Combination prescription sent.       Relevant Medications   losartan-hydrochlorothiazide (HYZAAR) 100-25 MG tablet   Other Relevant Orders   Lipid panel (Completed)   Hemoglobin A1c (Completed)   Microalbumin / creatinine urine ratio (Completed)   Urinalysis, Routine w reflex microscopic (Completed)   Vitamin D 1,25 dihydroxy   Comprehensive metabolic panel (Completed)   Magnesium (Completed)   AV block, 1st degree   Relevant Medications   losartan-hydrochlorothiazide (HYZAAR) 100-25 MG tablet   Other Relevant Orders   Vitamin D 1,25 dihydroxy   Magnesium (Completed)     Respiratory   OSA (obstructive sleep apnea)     Digestive   Drug induced constipation    Stable. Continue use of Linzess and Colace as needed for constipation related to Parkinson's medications.        Endocrine   Acquired hypothyroidism    Diagnosed in 1995, currently managed with Synthroid daily. -Continue current medication regimen.       Relevant Orders   TSH (Completed)     Nervous and Auditory   PD (Parkinson's disease)    Symptoms currently managed with Sinemet (25/100 long acting at night and 25/100 short acting three and a half tablets three times a day), Pramipexole 0.5mg  twice a day, and Amantadine 100mg  twice a day. Followed by Dr. Christell Constant at [facility] movement disorder clinic. -Continue current medication regimen. -Follow-up with Dr. Christell Constant as needed       Relevant Orders   Vitamin D 1,25 dihydroxy     Musculoskeletal and Integument   Primary osteoarthritis of left hip    Pain localized to left lower hip, site of previous hip replacement. History of steroid injection for similar pain. Currently undergoing physical therapy and personal  training. -Consider use of Robaxin as needed for muscle spasms. -Continue Voltaren (diclofenac) gel for topical pain relief. -Continue ongoing care with [facility] for pain injections      Relevant Orders   Vitamin D 1,25 dihydroxy     Other   Absolute anemia    History of B12 deficiency with anemia.  Will check anemia labs      Relevant Orders   CBC with Differential/Platelet (Completed)   Vitamin B12 (Completed)   Folate (Completed)   Iron and TIBC   Ferritin (Completed)   Family history of prostate cancer   Relevant Orders   PSA, Medicare (Completed)   History of elevated PSA    Elevated PSA and family history of prostate cancer.  Recheck      Relevant Orders   PSA, Medicare (Completed)   Unspecified disorder of calcium metabolism   Relevant Orders   Vitamin D 1,25 dihydroxy   Other Visit Diagnoses     Encounter for screening for diabetes mellitus       Relevant Orders   Hemoglobin A1c (Completed)   Encounter for screening for malignant neoplasm of prostate       Relevant Orders   PSA, Medicare (Completed)   Hypocalcemia       Relevant Orders   Vitamin D 1,25 dihydroxy       Medications Discontinued During This Encounter  Medication Reason   losartan (COZAAR) 100 MG tablet    hydrochlorothiazide (HYDRODIURIL) 25 MG tablet  aspirin EC 81 MG tablet     Return in about 6 months (around 09/10/2023) for BP.    Subjective:   Encounter date: 03/13/2023  Jim Carson is a 72 y.o. male who has Primary osteoarthritis of left hip; PD (Parkinson's disease); History of gastroesophageal reflux (GERD); OSA (obstructive sleep apnea); Essential hypertension; Drug induced constipation; Acquired hypothyroidism; Absolute anemia; Family history of prostate cancer; History of elevated PSA; AV block, 1st degree; and Unspecified disorder of calcium metabolism on their problem list..   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   Chief Complaint: Follow-up on routine lab work, medication management, and multiple chronic conditions.  History of Present Illness:  Hypertension: The patient's blood pressure today is 118/68 mmHg. Currently on Losartan 100 mg and Hydrochlorothiazide 25 mg. The patient expressed difficulty in obtaining the combination medication; therefore, individual medications were provided in the past.   Hypothyroidism: The patient is taking Levothyroxine 75 mcg daily. Monitoring continues.  Parkinson's Disease: The patient follows up with [facility] for Parkinson's and is managed on Sinemet and Pramipexole. No changes reported.  Anemia: The patient has a history of B12 deficiency anemia. Currently taking B12 supplements.   Hip and Back Pain: Managed with Diclofenac ointment and Methocarbamol. The patient is also scheduled for a cortisone shot next week and reports significant relief from these injections.  Constipation: Attributed to Sinemet; managed with Linzess 290 mcg daily and Colace as needed, which are stable.  Prostate Health: History of elevated PSA; family history of prostate cancer in his brother. PSA was elevated in 2021, but subsequent checks in 2022 and 2023 were normal.  Denies any urinary symptoms.  Hypocalcemia: Patient with a severe mild hypocalcemia in 2023.  Needs recheck for vitamin D.  Review of Systems  Constitutional:  Negative for chills, diaphoresis, fever, malaise/fatigue and weight loss.   HENT:  Negative for congestion, ear discharge, ear pain and hearing loss.   Eyes:  Negative for blurred vision, double vision, photophobia, pain, discharge and redness.  Respiratory:  Negative for cough, sputum production, shortness of breath and wheezing.   Cardiovascular:  Negative for chest pain, palpitations, orthopnea, claudication, leg swelling and PND.  Gastrointestinal:  Positive for constipation. Negative for abdominal pain, blood in stool, diarrhea, heartburn, melena, nausea and vomiting.  Genitourinary:  Negative for dysuria, flank pain, frequency, hematuria and urgency.  Musculoskeletal:  Positive for back pain and joint pain. Negative for myalgias.  Skin:  Negative for itching and rash.  Neurological:  Positive for tremors and weakness. Negative for dizziness, tingling, speech change, seizures, loss of consciousness and headaches.  Endo/Heme/Allergies:  Negative for polydipsia.  Psychiatric/Behavioral:  Negative for depression, hallucinations, memory loss, substance abuse and suicidal ideas. The patient does not have insomnia.   All other systems reviewed and are negative.   Past Surgical History:  Procedure Laterality Date   APPENDECTOMY  07/1964   JOINT REPLACEMENT  March 2019 and again on 09/10/2021   Left hip twice.  2nd time due to 3 dislocations   LAPAROSCOPIC CHOLECYSTECTOMY  01-21-2017   @[facility] , [place]   POSTERIOR FUSION CERVICAL SPINE  08-15-2011   @  [facility], [place]   w/ laminectomy and decompression-- C3 -- T1   POSTERIOR LAMINECTOMY / DECOMPRESSION LUMBAR SPINE  07-30-2013   @[facility] , [place]   bilateral T11 - 12,  L2 --5   REMOVAL LEFT T1 PARASPINOUS MASS  02-12-2016   @[facility] ,  [place]   desmoid fibromatosis   SHOULDER ARTHROSCOPY WITH ROTATOR CUFF REPAIR Left 12/15/2018   Procedure: SHOULDER ARTHROSCOPY, biceps tenodesis labored Debridement, submacromial decompression;  Surgeon: Eugenia Mcalpine, MD;  Location: [facility];  Service: Orthopedics;   Laterality: Left;  interscalene block   SPINE SURGERY     TOTAL HIP ARTHROPLASTY Left 09/18/2017   @[facility] , [place]   TOTAL HIP REVISION Left 09/10/2021   Procedure: Left hip acetabular versus total hip arthroplasty revision, constrained liner;  Surgeon: Ollen Gross, MD;  Location: [facility];  Service: Orthopedics;  Laterality: Left;    Outpatient Medications Prior to Visit  Medication Sig Dispense Refill   Amantadine HCl 100 MG tablet      carbidopa-levodopa (SINEMET IR) 25-100 MG tablet Take 3.5 tablets by mouth 3 (three) times daily.     Carbidopa-Levodopa ER (SINEMET CR) 25-100 MG tablet controlled release Take 1 tablet by mouth at bedtime.      Cholecalciferol (VITAMIN D3) 125 MCG (5000 UT) CAPS Take 5,000 Units by mouth in the morning.     desonide (DESOWEN) 0.05 % ointment Apply 1 application topically daily as needed (irritation).     diclofenac Sodium (VOLTAREN) 1 % GEL Apply 4 g topically 4 (four) times daily as needed. 100 g 3   docusate sodium (COLACE) 100 MG capsule Take 100 mg by mouth daily as needed for moderate constipation.     ipratropium (ATROVENT) 0.06 % nasal spray Place 2 sprays into both nostrils 2 (two) times daily as needed for rhinitis.     levothyroxine (SYNTHROID, LEVOTHROID) 75 MCG tablet Take 75 mcg by mouth daily before breakfast.     linaclotide (LINZESS) 290 MCG CAPS capsule Take 290 mcg by mouth daily as needed (constipation).     MAGNESIUM PO Take by mouth.     pramipexole (MIRAPEX) 0.5 MG tablet Take 0.5 mg by mouth in the morning and at bedtime.     vitamin B-12 (CYANOCOBALAMIN) 1000 MCG tablet Take 1,000 mcg by mouth daily.     aspirin EC 81 MG tablet Take by mouth.     hydrochlorothiazide (HYDRODIURIL) 25 MG tablet Take 25 mg by mouth in the morning.     losartan (COZAAR) 100 MG tablet Take 1 tablet (100 mg total) by mouth in the morning. 90 tablet 3   methocarbamol (ROBAXIN) 500 MG tablet Take 1 tablet (500 mg total) by mouth every 6 (six) hours as  needed for muscle spasms. (Patient not taking: Reported on 02/17/2023) 40 tablet 0   No facility-administered medications prior to visit.    Family History  Problem Relation Age of Onset   Heart disease Mother    Alcohol abuse Father    Prostate cancer Brother    Cancer Brother     Social History   Socioeconomic History   Marital status: Married    Spouse name: Not on file   Number of children: Not on file   Years of education: Not on file   Highest education level: Bachelor's degree (e.g., BA, AB, BS)  Occupational History   Not on file  Tobacco Use   Smoking status: Never   Smokeless tobacco: Never  Vaping Use   Vaping status: Never Used  Substance and Sexual Activity   Alcohol use: Yes    Alcohol/week: 8.0 standard drinks of alcohol    Types: 7 Glasses of wine, 1 Cans of beer per week    Comment: I drink a glass of wine with diner.  I like beer with pizza   Drug use: Never   Sexual activity: Yes    Birth control/protection: None  Other Topics Concern   Not on file  Social History Narrative   ** Merged History Encounter **       Social Determinants of Health   Financial Resource Strain: Low Risk  (03/07/2023)   Overall Financial Resource Strain (CARDIA)    Difficulty of Paying Living Expenses: Not hard at all  Food Insecurity: No Food Insecurity (03/07/2023)   Hunger Vital Sign    Worried About Running Out of Food in the Last Year: Never true    Ran Out of Food in the Last Year: Never true  Transportation Needs: No Transportation Needs (03/07/2023)   PRAPARE - Administrator, Civil Service (Medical): No    Lack of Transportation (Non-Medical): No  Physical Activity: Sufficiently Active (03/07/2023)   Exercise Vital Sign    Days of Exercise per Week: 3 days    Minutes of Exercise per Session: 60 min  Stress: No Stress Concern Present (03/07/2023)   [employer] of Occupational Health - Occupational Stress Questionnaire    Feeling of Stress : Not at  all  Social Connections: Unknown (03/07/2023)   Social Connection and Isolation Panel [NHANES]    Frequency of Communication with Friends and Family: Patient declined    Frequency of Social Gatherings with Friends and Family: Patient declined    Attends Religious Services: More than 4 times per year    Active Member of [employer] or Organizations: Yes    Attends Engineer, structural: More than 4 times per year    Marital Status: Married  Catering manager Violence: Not At Risk (02/20/2023)   Received from [facility]   HITS    Over the last 12 months how often did your partner physically hurt you?: 1    Over the last 12 months how often did your partner insult you or talk down to you?: 1    Over the last 12 months how often did your partner threaten you with physical harm?: 1    Over the last 12 months how often did your partner scream or curse at you?: 1                                                                                                  Objective:  Physical Exam: BP 118/68   Pulse (!) 47   Temp (!) 96.4 F (35.8 C) (Temporal)   Wt 197 lb (89.4 kg)   SpO2 98%   BMI 29.95 kg/m     Physical Exam EKG in 2023 had mild first-degree AV block  No results found.  Recent Results (from the past 2160 hour(s))  TSH     Status: None   Collection Time: 03/13/23 10:27 AM  Result Value Ref Range   TSH 1.56 0.35 - 5.50 uIU/mL  Lipid panel     Status: None   Collection Time: 03/13/23 10:27 AM  Result Value Ref Range   Cholesterol 114 0 - 200 mg/dL    Comment: ATP III Classification       Desirable:  < 200 mg/dL               Borderline High:  200 - 239 mg/dL          High:  > = 563 mg/dL   Triglycerides 87.5 0.0 - 149.0 mg/dL    Comment: Normal:  <643 mg/dLBorderline High:  150 - 199 mg/dL   HDL 32.95 >18.84 mg/dL   VLDL 16.6 0.0 - 06.3 mg/dL   LDL Cholesterol 52 0 - 99 mg/dL   Total CHOL/HDL Ratio 2     Comment:                Men          Women1/2 Average Risk      3.4          3.3Average Risk          5.0          4.42X Average Risk          9.6          7.13X Average Risk          15.0          11.0                       NonHDL 67.10     Comment: NOTE:  Non-HDL goal should be 30 mg/dL higher than patient's LDL goal (i.e. LDL goal of < 70 mg/dL, would have non-HDL goal of < 100 mg/dL)  Hemoglobin K1S     Status: None   Collection Time: 03/13/23 10:27 AM  Result Value Ref Range   Hgb A1c MFr Bld 5.5 4.6 - 6.5 %    Comment: Glycemic Control Guidelines for People with Diabetes:Non Diabetic:  <6%Goal of Therapy: <7%Additional Action Suggested:  >8%   Microalbumin / creatinine urine ratio     Status: None   Collection Time: 03/13/23 10:27 AM  Result Value Ref Range   Microalb, Ur 1.0 0.0 - 1.9 mg/dL   Creatinine,U 010.9 mg/dL   Microalb Creat Ratio 0.7 0.0 - 30.0 mg/g  Urinalysis, Routine w reflex microscopic     Status: Abnormal   Collection Time: 03/13/23 10:27 AM  Result Value Ref Range   Color, Urine YELLOW Yellow;Lt. Yellow;Straw;Dark Yellow;Amber;Green;Red;Brown   APPearance CLEAR Clear;Turbid;Slightly Cloudy;Cloudy   Specific Gravity, Urine >=1.030 (A) 1.000 - 1.030   pH 6.0 5.0 - 8.0   Total Protein, Urine NEGATIVE Negative   Urine Glucose NEGATIVE Negative   Ketones, ur TRACE (A) Negative   Bilirubin Urine NEGATIVE Negative   Hgb urine dipstick NEGATIVE Negative   Urobilinogen, UA 0.2 0.0 - 1.0   Leukocytes,Ua NEGATIVE Negative   Nitrite NEGATIVE Negative   WBC, UA 0-2/hpf 0-2/hpf   RBC / HPF none seen 0-2/hpf   Squamous Epithelial / HPF Rare(0-4/hpf) Rare(0-4/hpf)   Renal Epithel, UA Rare(0-4/hpf) (A) None  CBC with Differential/Platelet     Status: Abnormal   Collection Time: 03/13/23 10:27 AM  Result Value Ref Range   WBC 5.6 4.0 - 10.5 K/uL   RBC 4.77 4.22 - 5.81 Mil/uL   Hemoglobin 14.0 13.0 - 17.0  g/dL   HCT 32.4 40.1 - 02.7 %   MCV 90.5 78.0 - 100.0 fl   MCHC 32.5 30.0 - 36.0 g/dL   RDW 25.3 66.4 - 40.3 %   Platelets 143.0  (L) 150.0 - 400.0 K/uL   Neutrophils Relative % 63.7 43.0 - 77.0 %   Lymphocytes Relative 24.3 12.0 - 46.0 %   Monocytes Relative 8.3 3.0 - 12.0 %   Eosinophils Relative 2.8 0.0 - 5.0 %   Basophils Relative 0.9 0.0 - 3.0 %   Neutro Abs 3.6 1.4 - 7.7 K/uL   Lymphs Abs 1.4 0.7 - 4.0 K/uL   Monocytes Absolute 0.5 0.1 - 1.0 K/uL   Eosinophils Absolute 0.2 0.0 - 0.7 K/uL   Basophils Absolute 0.0 0.0 - 0.1 K/uL  Comprehensive metabolic panel     Status: None   Collection Time: 03/13/23 10:27 AM  Result Value Ref Range   Sodium 144 135 - 145 mEq/L   Potassium 4.8 3.5 - 5.1 mEq/L   Chloride 104 96 - 112 mEq/L   CO2 30 19 - 32 mEq/L   Glucose, Bld 87 70 - 99 mg/dL   BUN 19 6 - 23 mg/dL   Creatinine, Ser 4.74 0.40 - 1.50 mg/dL   Total Bilirubin 1.1 0.2 - 1.2 mg/dL   Alkaline Phosphatase 59 39 - 117 U/L   AST 16 0 - 37 U/L   ALT 8 0 - 53 U/L   Total Protein 6.8 6.0 - 8.3 g/dL   Albumin 4.3 3.5 - 5.2 g/dL   GFR 25.95 >63.87 mL/min    Comment: Calculated using the CKD-EPI Creatinine Equation (2021)   Calcium 10.1 8.4 - 10.5 mg/dL  Vitamin F64     Status: None   Collection Time: 03/13/23 10:27 AM  Result Value Ref Range   Vitamin B-12 600 211 - 911 pg/mL  Folate     Status: None   Collection Time: 03/13/23 10:27 AM  Result Value Ref Range   Folate 18.4 >5.9 ng/mL  Ferritin     Status: None   Collection Time: 03/13/23 10:27 AM  Result Value Ref Range   Ferritin 100.8 22.0 - 322.0 ng/mL  PSA, Medicare     Status: None   Collection Time: 03/13/23 10:27 AM  Result Value Ref Range   PSA 3.20 0.10 - 4.00 ng/ml    Comment: Test performed using Access Hybritech PSA Assay, a parmagnetic partical, chemiluminecent immunoassay.  Magnesium     Status: None   Collection Time: 03/13/23 10:27 AM  Result Value Ref Range   Magnesium 2.0 1.5 - 2.5 mg/dL        Garner Nash, MD, MS

## 2023-03-13 NOTE — Patient Instructions (Signed)
Take your losartan and hydrochlorothiazide as a combination pill. Continue your current medications for hypothyroidism and Parkinson's as prescribed. Get your lab work done for B12, folate, iron levels, ferritin, thyroid, cholesterol, blood sugar, and a metabolic panel. Continue using diclofenac and methocarbamol for hip pain, and Linzess and Colace for constipation. Consider stopping the daily baby aspirin if you wish, as discussed.

## 2023-03-13 NOTE — Assessment & Plan Note (Signed)
Pain localized to left lower hip, site of previous hip replacement. History of steroid injection for similar pain. Currently undergoing physical therapy and personal training. -Consider use of Robaxin as needed for muscle spasms. -Continue Voltaren (diclofenac) gel for topical pain relief. -Continue ongoing care with [facility] for pain injections

## 2023-03-13 NOTE — Assessment & Plan Note (Signed)
Stable. Continue use of Linzess and Colace as needed for constipation related to Parkinson's medications.

## 2023-03-13 NOTE — Assessment & Plan Note (Signed)
Diagnosed in 1995, currently managed with Synthroid daily. -Continue current medication regimen.

## 2023-03-13 NOTE — Assessment & Plan Note (Signed)
Diagnosed in 2005, currently managed with Losartan 100mg  daily and Hydrochlorothiazide 25mg  daily. -Combination prescription sent.

## 2023-03-14 ENCOUNTER — Other Ambulatory Visit: Payer: Medicare Other

## 2023-03-14 DIAGNOSIS — D729 Disorder of white blood cells, unspecified: Secondary | ICD-10-CM

## 2023-03-14 DIAGNOSIS — D696 Thrombocytopenia, unspecified: Secondary | ICD-10-CM

## 2023-03-14 LAB — IRON AND TIBC
Iron Saturation: 31 % (ref 15–55)
Iron: 97 ug/dL (ref 38–169)
Total Iron Binding Capacity: 310 ug/dL (ref 250–450)
UIBC: 213 ug/dL (ref 111–343)

## 2023-03-14 NOTE — Addendum Note (Signed)
Addended by: Fanny Bien B on: 03/14/2023 10:08 AM   Modules accepted: Orders

## 2023-03-16 LAB — VITAMIN D 1,25 DIHYDROXY
Vitamin D 1, 25 (OH)2 Total: 26 pg/mL (ref 18–72)
Vitamin D2 1, 25 (OH)2: <8 pg/mL
Vitamin D3 1, 25 (OH)2: 26 pg/mL

## 2023-03-17 DIAGNOSIS — D696 Thrombocytopenia, unspecified: Secondary | ICD-10-CM | POA: Insufficient documentation

## 2023-03-17 LAB — PATHOLOGIST SMEAR REVIEW

## 2023-03-18 ENCOUNTER — Encounter: Payer: Self-pay | Admitting: Family Medicine

## 2023-03-21 ENCOUNTER — Telehealth: Payer: Self-pay | Admitting: Family Medicine

## 2023-03-21 NOTE — Telephone Encounter (Signed)
Thanks

## 2023-03-21 NOTE — Telephone Encounter (Signed)
Pts wife called with a request for the referral to onchology. They would like the referral to go to   Dr Mathis Bud with [facility] [phone] [place]

## 2023-03-21 NOTE — Telephone Encounter (Signed)
I called the pt to let him know the referral has been placed.

## 2023-04-17 NOTE — Progress Notes (Deleted)
Office Visit Note  Patient: Jim Carson             Date of Birth: 1951-04-03           MRN: [id number]             PCP: Garnette Gunner, MD Referring: Malka So., MD Visit Date: 05/01/2023 Occupation: @GUAROCC @  Subjective:  No chief complaint on file.   History of Present Illness: Jim Carson is a 72 y.o. male ***     Activities of Daily Living:  Patient reports morning stiffness for *** {minute/hour:19697}.   Patient {ACTIONS;DENIES/REPORTS:21021675::"Denies"} nocturnal pain.  Difficulty dressing/grooming: {ACTIONS;DENIES/REPORTS:21021675::"Denies"} Difficulty climbing stairs: {ACTIONS;DENIES/REPORTS:21021675::"Denies"} Difficulty getting out of chair: {ACTIONS;DENIES/REPORTS:21021675::"Denies"} Difficulty using hands for taps, buttons, cutlery, and/or writing: {ACTIONS;DENIES/REPORTS:21021675::"Denies"}  No Rheumatology ROS completed.   PMFS History:  Patient Active Problem List   Diagnosis Date Noted   Thrombocytopenia (HCC) 03/17/2023   Absolute anemia 03/13/2023   Family history of prostate cancer 03/13/2023   History of elevated PSA 03/13/2023   AV block, 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   Drug induced constipation 12/30/2022   Acquired hypothyroidism 12/30/2022   Primary osteoarthritis of left hip 05/18/2021   PD (Parkinson's disease) (HCC) 05/18/2021   History of gastroesophageal reflux (GERD) 05/18/2021   OSA (obstructive sleep apnea) 05/18/2021   Essential hypertension 05/18/2021    Past Medical History:  Diagnosis Date   Abnormal gait    due to parkinson's,  uses cane   Absolute anemia 03/13/2023   Allergic rhinitis    Allergy Spring 1998   Allergic to dustmits, dog hair and roaches.   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 Lorin Picket  @[facility]   Pneumonia    Sleep apnea    Vitamin B12 deficiency    Vitamin D deficiency    Wears glasses     Family History  Problem Relation Age of Onset   Heart disease Mother    Alcohol abuse Father    Prostate cancer Brother    Cancer Brother    Past Surgical History:  Procedure Laterality Date   APPENDECTOMY  07/1964   JOINT REPLACEMENT  March 2019 and again on 09/10/2021   Left hip twice.  2nd time due to 3 dislocations   LAPAROSCOPIC CHOLECYSTECTOMY  01-21-2017   @[facility] , [place]   POSTERIOR FUSION CERVICAL SPINE  08-15-2011   @[facility] , [place]   w/ laminectomy and decompression-- C3 -- T1   POSTERIOR LAMINECTOMY / DECOMPRESSION LUMBAR SPINE  07-30-2013   @[facility] , [place]   bilateral T11 - 12,  L2 --5   REMOVAL LEFT T1 PARASPINOUS MASS  02-12-2016   @[facility] ,  [place]   desmoid fibromatosis   SHOULDER ARTHROSCOPY WITH ROTATOR CUFF REPAIR Left 12/15/2018   Procedure: SHOULDER ARTHROSCOPY, biceps tenodesis labored Debridement, submacromial decompression;  Surgeon: Eugenia Mcalpine, MD;  Location: [facility];  Service: Orthopedics;  Laterality: Left;  interscalene block   SPINE SURGERY     TOTAL HIP ARTHROPLASTY Left 09/18/2017   @[facility] , [place]   TOTAL HIP REVISION Left 09/10/2021   Procedure: Left hip acetabular versus total hip arthroplasty revision, constrained liner;  Surgeon: Ollen Gross, MD;  Location: [facility];  Service: Orthopedics;  Laterality: Left;   Social History   Social History Narrative   ** Merged History Encounter **       Immunization History  Administered Date(s) Administered   Fluad Quad(high Dose 65+)  03/14/2021   Gamma Globulin 03/08/1993, 07/24/1993, 11/21/1993, 05/20/1994   Influenza Inj Mdck Quad Pf 03/20/2022   Influenza Split 02/22/2019, 02/24/2019   Influenza, High Dose Seasonal PF 04/13/2018, 03/09/2020, 03/14/2021   Influenza,inj,Quad PF,6+ Mos 03/03/2014, 03/24/2015, 04/02/2016, 04/22/2017, 02/24/2019   Influenza,inj,Quad PF,6-35 Mos 02/24/2019   Influenza,inj,quad, With Preservative 02/22/2019   Influenza-Unspecified 03/03/2014, 03/24/2015, 04/02/2016, 04/22/2017, 04/13/2018, 02/22/2019, 02/24/2019   Moderna Covid-19 Fall Seasonal Vaccine 26yrs & older 06/03/2022   PFIZER Comirnaty(Gray Top)Covid-19 Tri-Sucrose Vaccine 10/14/2020, 03/29/2021   PFIZER(Purple Top)SARS-COV-2 Vaccination 07/29/2019, 08/24/2019, 03/27/2020   PNEUMOCOCCAL CONJUGATE-20 03/27/2021   Pneumococcal Conjugate,unspecified 03/03/2014, 03/24/2015   Pneumococcal Conjugate-13 03/03/2014, 03/24/2015   Pneumococcal Polysaccharide-23 03/03/2014, 03/24/2015   Pneumococcal-Unspecified 03/03/2014, 03/24/2015   Polio, Unspecified 07/12/1954, 08/23/1954, 09/27/1955, 11/23/1959   Respiratory Syncytial Virus Vaccine,Recomb Aduvanted(Arexvy) 06/04/2022   Smallpox 10/04/1954   Td 10/04/1954, 03/08/1993   Tdap 12/10/2012, 07/24/2020   Unspecified SARS-COV-2 Vaccination 03/27/2020   Zoster Recombinant(Shingrix) 11/14/2017, 02/26/2018, 02/27/2018, 02/22/2019   Zoster, Live 12/30/2011   Zoster, Unspecified 11/14/2017, 02/26/2018, 02/27/2018, 02/22/2019     Objective: Vital Signs: There were no vitals taken for this visit.   Physical Exam   Musculoskeletal Exam: ***  CDAI Exam: CDAI Score: -- Patient Global: --; Provider Global: -- Swollen: --; Tender: -- Joint Exam 05/01/2023   No joint exam has been documented for this visit   There is currently no information documented on the homunculus. Go to the Rheumatology activity and complete the homunculus joint exam.  Investigation: No additional  findings.  Imaging: No results found.  Recent Labs: Lab Results  Component Value Date   WBC 5.6 03/13/2023   HGB 14.0 03/13/2023   PLT 143.0 (L) 03/13/2023   NA 144 03/13/2023   K 4.8 03/13/2023   CL 104 03/13/2023   CO2 30 03/13/2023   GLUCOSE 87 03/13/2023   BUN 19 03/13/2023   CREATININE 1.00 03/13/2023   BILITOT 1.1 03/13/2023   ALKPHOS 59 03/13/2023   AST 16 03/13/2023   ALT 8 03/13/2023   PROT 6.8 03/13/2023   ALBUMIN 4.3 03/13/2023   CALCIUM 10.1 03/13/2023   GFRAA >60 12/15/2018    Speciality Comments: No specialty comments available.  Procedures:  No procedures performed Allergies: Other   Assessment / Plan:     Visit Diagnoses: Primary osteoarthritis of both hands  Status post total hip replacement, left  DDD (degenerative disc disease), cervical  Spondylosis without myelopathy or radiculopathy, lumbar region  Keratoconjunctivitis sicca of both eyes  Parkinson's disease, unspecified whether dyskinesia present, unspecified whether manifestations fluctuate (HCC)  Essential hypertension  History of gastroesophageal reflux (GERD)  History of hypothyroidism  Vitamin D deficiency  Vitamin B12 deficiency  OSA (obstructive sleep apnea)  Orders: No orders of the defined types were placed in this encounter.  No orders of the defined types were placed in this encounter.   Face-to-face time spent with patient was *** minutes. Greater than 50% of time  was spent in counseling and coordination of care.  Follow-Up Instructions: No follow-ups on file.   Gearldine Bienenstock, PA-C  Note - This record has been created using Dragon software.  Chart creation errors have been sought, but may not always  have been located. Such creation errors do not reflect on  the standard of medical care.

## 2023-04-21 NOTE — Progress Notes (Unsigned)
Office Visit Note  Patient: Jim Carson             Date of Birth: December 10, 1950           MRN: [id number]             PCP: Garnette Gunner, MD Referring: Malka So., MD Visit Date: 04/23/2023 Occupation: @GUAROCC @  Subjective:  Pain in both hands  History of Present Illness: Jim Carson is a 72 y.o. male with history of osteoarthritis and degenerative disc disease.  He returns today after his last visit in December 2022.  Patient states he has been having increased pain and stiffness in his bilateral hands and wrist.  He continues to have intermittent neck and lower back pain.  He had an epidural about a month ago which was helpful.  He ambulates with the help of a walker.    Activities of Daily Living:  Patient reports morning stiffness for 15 minutes.   Patient Reports nocturnal pain.  Difficulty dressing/grooming: Reports Difficulty climbing stairs: Reports Difficulty getting out of chair: Reports Difficulty using hands for taps, buttons, cutlery, and/or writing: Reports  Review of Systems  Constitutional:  Negative for fatigue.  HENT:  Positive for mouth dryness. Negative for mouth sores.   Eyes:  Negative for dryness.  Respiratory:  Negative for shortness of breath.   Cardiovascular:  Negative for chest pain and palpitations.  Gastrointestinal:  Positive for constipation. Negative for blood in stool and diarrhea.  Endocrine: Positive for increased urination.  Genitourinary:  Negative for painful urination and involuntary urination.  Musculoskeletal:  Positive for joint pain, joint pain, joint swelling, muscle weakness and morning stiffness. Negative for myalgias, muscle tenderness and myalgias.  Skin:  Negative for color change, rash, hair loss and sensitivity to sunlight.  Allergic/Immunologic: Negative for susceptible to infections.  Neurological:  Negative for dizziness and headaches.  Hematological:  Negative for swollen glands.  Psychiatric/Behavioral:   Negative for depressed mood and sleep disturbance. The patient is not nervous/anxious.     PMFS History:  Patient Active Problem List   Diagnosis Date Noted   Thrombocytopenia (HCC) 03/17/2023   Absolute anemia 03/13/2023   Family history of prostate cancer 03/13/2023   History of elevated PSA 03/13/2023   AV block, 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   Drug induced constipation 12/30/2022   Acquired hypothyroidism 12/30/2022   Primary osteoarthritis of left hip 05/18/2021   PD (Parkinson's disease) (HCC) 05/18/2021   History of gastroesophageal reflux (GERD) 05/18/2021   OSA (obstructive sleep apnea) 05/18/2021   Essential hypertension 05/18/2021    Past Medical History:  Diagnosis Date   Abnormal gait    due to parkinson's,  uses cane   Absolute anemia 03/13/2023   Allergic rhinitis    Allergy Spring 1998   Allergic to dustmits, dog hair and roaches.   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 Lorin Picket  @[facility]   Pneumonia    Sleep apnea    Vitamin B12 deficiency    Vitamin D deficiency    Wears glasses     Family History  Problem Relation Age of Onset   Heart disease Mother    Alcohol abuse Father    Prostate cancer Brother    Cancer Brother    Past Surgical History:  Procedure Laterality Date   APPENDECTOMY  07/1964   JOINT REPLACEMENT  March 2019 and again on 09/10/2021   Left hip twice.  2nd time due to 3 dislocations    LAPAROSCOPIC CHOLECYSTECTOMY  01-21-2017   @[facility] , [place]   POSTERIOR FUSION CERVICAL SPINE  08-15-2011   @[facility] , [place]   w/ laminectomy and decompression-- C3 -- T1   POSTERIOR LAMINECTOMY / DECOMPRESSION LUMBAR SPINE  07-30-2013   @[facility] , [place]   bilateral T11 - 12,  L2 --5   REMOVAL LEFT T1 PARASPINOUS MASS  02-12-2016   @[facility] ,  [place]   desmoid fibromatosis   SHOULDER ARTHROSCOPY WITH ROTATOR CUFF REPAIR Left 12/15/2018   Procedure: SHOULDER ARTHROSCOPY, biceps tenodesis labored Debridement, submacromial decompression;  Surgeon: Eugenia Mcalpine, MD;  Location: [facility];  Service: Orthopedics;  Laterality: Left;  interscalene block   SPINE SURGERY     TOTAL HIP ARTHROPLASTY Left 09/18/2017   @[facility] , [place]   TOTAL HIP REVISION Left 09/10/2021   Procedure: Left hip acetabular versus total hip arthroplasty revision, constrained liner;  Surgeon: Ollen Gross, MD;  Location: [facility];  Service: Orthopedics;  Laterality: Left;   Social History   Social History Narrative   ** Merged History Encounter **       Immunization History  Administered Date(s) Administered   Fluad Quad(high Dose 65+) 03/14/2021   Gamma Globulin 03/08/1993, 07/24/1993, 11/21/1993, 05/20/1994   Influenza Inj Mdck Quad Pf 03/20/2022   Influenza Split 02/22/2019, 02/24/2019   Influenza, High Dose Seasonal PF 04/13/2018, 03/09/2020, 03/14/2021   Influenza,inj,Quad PF,6+ Mos 03/03/2014, 03/24/2015, 04/02/2016, 04/22/2017, 02/24/2019   Influenza,inj,Quad PF,6-35 Mos 02/24/2019   Influenza,inj,quad, With Preservative 02/22/2019   Influenza-Unspecified 03/03/2014, 03/24/2015, 04/02/2016, 04/22/2017, 04/13/2018, 02/22/2019, 02/24/2019   Moderna Covid-19 Fall Seasonal Vaccine 29yrs & older 06/03/2022   PFIZER Comirnaty(Gray Top)Covid-19 Tri-Sucrose Vaccine 10/14/2020, 03/29/2021   PFIZER(Purple Top)SARS-COV-2 Vaccination 07/29/2019, 08/24/2019, 03/27/2020   PNEUMOCOCCAL CONJUGATE-20 03/27/2021    Pneumococcal Conjugate,unspecified 03/03/2014, 03/24/2015   Pneumococcal Conjugate-13 03/03/2014, 03/24/2015   Pneumococcal Polysaccharide-23 03/03/2014, 03/24/2015   Pneumococcal-Unspecified 03/03/2014, 03/24/2015   Polio, Unspecified 07/12/1954, 08/23/1954, 09/27/1955, 11/23/1959   Respiratory Syncytial Virus Vaccine,Recomb Aduvanted(Arexvy) 06/04/2022   Smallpox 10/04/1954   Td 10/04/1954, 03/08/1993   Tdap 12/10/2012, 07/24/2020   Unspecified SARS-COV-2 Vaccination 03/27/2020   Zoster Recombinant(Shingrix) 11/14/2017, 02/26/2018, 02/27/2018, 02/22/2019   Zoster, Live 12/30/2011   Zoster, Unspecified 11/14/2017, 02/26/2018, 02/27/2018, 02/22/2019     Objective: Vital Signs: BP (!) 123/56 (BP Location: Left Arm, Patient Position: Sitting, Cuff Size: Normal)   Pulse (!) 45   Resp 15   Ht 5\' 8"  (1.727 m)   Wt 198 lb 3.2 oz (89.9 kg)   BMI 30.14 kg/m    Physical Exam Vitals and nursing note reviewed.  Constitutional:      Appearance: He is well-developed.  HENT:     Head: Normocephalic and atraumatic.  Eyes:     Conjunctiva/sclera: Conjunctivae normal.     Pupils: Pupils are equal, round, and reactive to light.  Cardiovascular:     Rate and Rhythm: Normal  rate and regular rhythm.     Heart sounds: Normal heart sounds.  Pulmonary:     Effort: Pulmonary effort is normal.     Breath sounds: Normal breath sounds.  Abdominal:     General: Bowel sounds are normal.     Palpations: Abdomen is soft.  Musculoskeletal:     Cervical back: Normal range of motion and neck supple.  Skin:    General: Skin is warm and dry.     Capillary Refill: Capillary refill takes less than 2 seconds.  Neurological:     Mental Status: He is alert and oriented to person, place, and time.  Psychiatric:        Behavior: Behavior normal.      Musculoskeletal Exam: He had limited flexion extension and lateral rotation of the cervical spine.  Thoracic kyphosis was noted.  He had limited painful range  of motion of the lumbar spine.  Shoulders were in good range of motion.  He has contracture in bilateral elbows with no synovitis.  He had good range of motion of his wrist joints.  Bilateral CMC thickening was noted.  He had tenderness over right CMC joint.  PIP and DIP thickening with no synovitis was noted.  Hip joints could not be assessed in the seated position.  Knee joints were in good range of motion without any warmth swelling or effusion.  There was no tenderness over ankles or MTPs.  CDAI Exam: CDAI Score: -- Patient Global: --; Provider Global: -- Swollen: --; Tender: -- Joint Exam 04/23/2023   No joint exam has been documented for this visit   There is currently no information documented on the homunculus. Go to the Rheumatology activity and complete the homunculus joint exam.  Investigation: No additional findings.  Imaging: XR Hand 2 View Right  Result Date: 04/23/2023 Severe CMC, PIP and DIP narrowing was noted.  No MCP, intercarpal or radiocarpal joint space narrowing was noted.  No radiographic progression was noted when compared to the x-rays of 2022. Impression: These findings are suggestive of osteoarthritis of the hand.   Recent Labs: Lab Results  Component Value Date   WBC 5.6 03/13/2023   HGB 14.0 03/13/2023   PLT 143.0 (L) 03/13/2023   NA 144 03/13/2023   K 4.8 03/13/2023   CL 104 03/13/2023   CO2 30 03/13/2023   GLUCOSE 87 03/13/2023   BUN 19 03/13/2023   CREATININE 1.00 03/13/2023   BILITOT 1.1 03/13/2023   ALKPHOS 59 03/13/2023   AST 16 03/13/2023   ALT 8 03/13/2023   PROT 6.8 03/13/2023   ALBUMIN 4.3 03/13/2023   CALCIUM 10.1 03/13/2023   GFRAA >60 12/15/2018    Speciality Comments: No specialty comments available.  Procedures:  Small Joint Inj: R thumb CMC on 04/23/2023 10:15 AM Indications: pain Details: 27 G needle, ultrasound-guided radial approach  Spinal Needle: No  Medications: 20 mg triamcinolone acetonide 40 MG/ML; 0.5 mL  lidocaine 1 % Aspirate: 0 mL Outcome: tolerated well, no immediate complications Procedure, treatment alternatives, risks and benefits explained, specific risks discussed. Consent was given by the patient. Immediately prior to procedure a time out was called to verify the correct patient, procedure, equipment, support staff and site/side marked as required. Patient was prepped and draped in the usual sterile fashion.     Allergies: Other   Assessment / Plan:     Visit Diagnoses: Arthritis of carpometacarpal (CMC) joint of right thumb -patient complains of severe pain and discomfort in her right [facility]  joint.  He has difficulty with holding objects.  He uses a walker to ambulate which probably puts pressure on his right CMC joint.  A prescription for right CMC brace was given.  I discussed ultrasound-guided right CMC injection.  Patient wants to proceed with the injection.  Side effects were discussed.  After informed consent was obtained right CMC joint was injected with lidocaine and Kenalog as described above.  Patient tolerated the procedure well.  Postprocedure instructions given.  Plan: US Guided Needle Placement  Primary osteoarthritis of both hands -patient complains of pain and stiffness in his bilateral hands.  Bilateral PIP and DIP thickening was noted.  No synovitis was noted.  Clinical and radiographic findings were consistent with osteoarthritis.    All autoimmune work-up negative in the past.  No synovitis was noted. - Plan: US Guided Needle Placement, XR Hand 2 View Right, XR Hand 2 View Left.  X-ray findings were reviewed with the patient.  A handout on hand exercises was given.  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  Patient reports that he is doing well.  DDD (degenerative disc disease), cervical -he had limited flexion extension and lateral rotation.  Status post fusion 2013.  He continues to have right thumb and index finger numbness for many years.  Spondylosis of  lumbar spine -he has intermittent discomfort.  He had an epidural injection about a month ago.  Status post discectomy in 2015 for spinal stenosis.  History of right lower extremity residual numbness.  The medical problems are listed as follows:  Keratoconjunctivitis sicca of both eyes - Relates to medication use.  History of Parkinson disease - Followed by [facility] neurology.  History of gastroesophageal reflux (GERD)  Essential hypertension  History of hypothyroidism  Vitamin D deficiency  Vitamin B12 deficiency  OSA (obstructive sleep apnea)  Orders: Orders Placed This Encounter  Procedures   Small Joint Inj   US Guided Needle Placement   XR Hand 2 View Right   XR Hand 2 View Left   No orders of the defined types were placed in this encounter.    Follow-Up Instructions: Return if symptoms worsen or fail to improve, for Osteoarthritis.   Pollyann Savoy, MD  Note - This record has been created using Animal nutritionist.  Chart creation errors have been sought, but may not always  have been located. Such creation errors do not reflect on  the standard of medical care.

## 2023-04-23 ENCOUNTER — Ambulatory Visit: Payer: Medicare Other | Attending: Rheumatology | Admitting: Rheumatology

## 2023-04-23 ENCOUNTER — Ambulatory Visit: Payer: Medicare Other

## 2023-04-23 ENCOUNTER — Encounter: Payer: Self-pay | Admitting: Rheumatology

## 2023-04-23 VITALS — BP 123/56 | HR 45 | Resp 15 | Ht 68.0 in | Wt 198.2 lb

## 2023-04-23 DIAGNOSIS — M19041 Primary osteoarthritis, right hand: Secondary | ICD-10-CM

## 2023-04-23 DIAGNOSIS — G4733 Obstructive sleep apnea (adult) (pediatric): Secondary | ICD-10-CM

## 2023-04-23 DIAGNOSIS — M47816 Spondylosis without myelopathy or radiculopathy, lumbar region: Secondary | ICD-10-CM

## 2023-04-23 DIAGNOSIS — Z8639 Personal history of other endocrine, nutritional and metabolic disease: Secondary | ICD-10-CM

## 2023-04-23 DIAGNOSIS — M19042 Primary osteoarthritis, left hand: Secondary | ICD-10-CM

## 2023-04-23 DIAGNOSIS — I10 Essential (primary) hypertension: Secondary | ICD-10-CM | POA: Diagnosis present

## 2023-04-23 DIAGNOSIS — Z96642 Presence of left artificial hip joint: Secondary | ICD-10-CM

## 2023-04-23 DIAGNOSIS — Z8669 Personal history of other diseases of the nervous system and sense organs: Secondary | ICD-10-CM

## 2023-04-23 DIAGNOSIS — H16223 Keratoconjunctivitis sicca, not specified as Sjogren's, bilateral: Secondary | ICD-10-CM

## 2023-04-23 DIAGNOSIS — M1811 Unilateral primary osteoarthritis of first carpometacarpal joint, right hand: Secondary | ICD-10-CM | POA: Diagnosis present

## 2023-04-23 DIAGNOSIS — Z8719 Personal history of other diseases of the digestive system: Secondary | ICD-10-CM

## 2023-04-23 DIAGNOSIS — E538 Deficiency of other specified B group vitamins: Secondary | ICD-10-CM | POA: Diagnosis present

## 2023-04-23 DIAGNOSIS — M5030 Other cervical disc degeneration, unspecified cervical region: Secondary | ICD-10-CM | POA: Diagnosis present

## 2023-04-23 DIAGNOSIS — E559 Vitamin D deficiency, unspecified: Secondary | ICD-10-CM | POA: Diagnosis present

## 2023-04-23 MED ORDER — TRIAMCINOLONE ACETONIDE 40 MG/ML IJ SUSP
20.0000 mg | INTRAMUSCULAR | Status: AC | PRN
  Administered 2023-04-23: 20 mg via INTRA_ARTICULAR

## 2023-04-23 MED ORDER — LIDOCAINE HCL 1 % IJ SOLN
0.5000 mL | INTRAMUSCULAR | Status: AC | PRN
  Administered 2023-04-23: .5 mL

## 2023-04-23 NOTE — Patient Instructions (Signed)

## 2023-05-01 ENCOUNTER — Encounter: Payer: Medicare Other | Admitting: Rheumatology

## 2023-05-22 ENCOUNTER — Ambulatory Visit: Payer: Medicare Other | Admitting: Rheumatology

## 2023-06-23 ENCOUNTER — Encounter (HOSPITAL_BASED_OUTPATIENT_CLINIC_OR_DEPARTMENT_OTHER): Payer: Self-pay | Admitting: Emergency Medicine

## 2023-06-23 ENCOUNTER — Emergency Department (HOSPITAL_BASED_OUTPATIENT_CLINIC_OR_DEPARTMENT_OTHER)
Admission: EM | Admit: 2023-06-23 | Discharge: 2023-06-24 | Disposition: A | Discharge status: Home / Self Care | Payer: Medicare Other | Attending: Emergency Medicine | Admitting: Emergency Medicine

## 2023-06-23 ENCOUNTER — Other Ambulatory Visit: Payer: Self-pay

## 2023-06-23 DIAGNOSIS — K5900 Constipation, unspecified: Secondary | ICD-10-CM | POA: Insufficient documentation

## 2023-06-23 DIAGNOSIS — Z85828 Personal history of other malignant neoplasm of skin: Secondary | ICD-10-CM | POA: Diagnosis not present

## 2023-06-23 DIAGNOSIS — I10 Essential (primary) hypertension: Secondary | ICD-10-CM | POA: Diagnosis not present

## 2023-06-23 DIAGNOSIS — R140 Abdominal distension (gaseous): Secondary | ICD-10-CM | POA: Diagnosis present

## 2023-06-23 DIAGNOSIS — E039 Hypothyroidism, unspecified: Secondary | ICD-10-CM | POA: Insufficient documentation

## 2023-06-23 DIAGNOSIS — G20C Parkinsonism, unspecified: Secondary | ICD-10-CM | POA: Insufficient documentation

## 2023-06-23 LAB — CBC WITH DIFFERENTIAL/PLATELET
Abs Immature Granulocytes: 0.05 10*3/uL (ref 0.00–0.07)
Basophils Absolute: 0.1 10*3/uL (ref 0.0–0.1)
Basophils Relative: 1 %
Eosinophils Absolute: 0.2 10*3/uL (ref 0.0–0.5)
Eosinophils Relative: 2 %
HCT: 44.2 % (ref 39.0–52.0)
Hemoglobin: 15.1 g/dL (ref 13.0–17.0)
Immature Granulocytes: 1 %
Lymphocytes Relative: 15 %
Lymphs Abs: 1.3 10*3/uL (ref 0.7–4.0)
MCH: 29.9 pg (ref 26.0–34.0)
MCHC: 34.2 g/dL (ref 30.0–36.0)
MCV: 87.5 fL (ref 80.0–100.0)
Monocytes Absolute: 0.6 10*3/uL (ref 0.1–1.0)
Monocytes Relative: 7 %
Neutro Abs: 6.2 10*3/uL (ref 1.7–7.7)
Neutrophils Relative %: 74 %
Platelets: 185 10*3/uL (ref 150–400)
RBC: 5.05 MIL/uL (ref 4.22–5.81)
RDW: 13.2 % (ref 11.5–15.5)
WBC: 8.4 10*3/uL (ref 4.0–10.5)
nRBC: 0 % (ref 0.0–0.2)

## 2023-06-23 LAB — COMPREHENSIVE METABOLIC PANEL
ALT: 9 U/L (ref 0–44)
AST: 24 U/L (ref 15–41)
Albumin: 4.8 g/dL (ref 3.5–5.0)
Alkaline Phosphatase: 68 U/L (ref 38–126)
Anion gap: 9 (ref 5–15)
BUN: 21 mg/dL (ref 8–23)
CO2: 22 mmol/L (ref 22–32)
Calcium: 10 mg/dL (ref 8.9–10.3)
Chloride: 104 mmol/L (ref 98–111)
Creatinine, Ser: 0.92 mg/dL (ref 0.61–1.24)
GFR, Estimated: >60 mL/min (ref >60)
Glucose, Bld: 109 mg/dL — ABNORMAL HIGH (ref 70–99)
Potassium: 3.5 mmol/L (ref 3.5–5.1)
Sodium: 135 mmol/L (ref 135–145)
Total Bilirubin: 1.1 mg/dL (ref <1.2)
Total Protein: 8 g/dL (ref 6.5–8.1)

## 2023-06-23 NOTE — ED Notes (Signed)
Pt. Walked to restroom to try again to have a BM

## 2023-06-23 NOTE — ED Notes (Signed)
Pt. Reports he is unable to Urinate and has had more than 16 ounces of liquid.  Pt. Reports he is unable to have a BM and remembers his last bm was on Thursday.  Pt. Reports he can't get an enema in due to he is too impacted.

## 2023-06-23 NOTE — ED Triage Notes (Signed)
Pt presents to the ED via POV with complaints of constipation x 4 days. Pt states that he had a BM on Thursday of his normal soft consistency and since then he hasn't defecated. PTA pt tried an enema and Linzess without any success. Pt endorses pain when trying to insert the enema. A&Ox4 at this time. Denies CP or SOB.

## 2023-06-23 NOTE — ED Provider Notes (Signed)
[place] EMERGENCY DEPARTMENT AT [facility] Provider Note  CSN: [id number] Arrival date & time: 06/23/23 2058  Chief Complaint(s) Constipation  HPI Jim Carson is a 72 y.o. male with a past medical history listed below who presents to the emergency department for constipation x 4 days.  Patient states that he feels that he is got to have a bowel movement but is unable to.  He has abdominal distention without overt pain.  Reports that he has dealt with this in the past and he has tried an enema but did not stay in.  He took Linzess earlier this afternoon without any success.  He has not tried any MiraLAX.  No nausea or vomiting.  Prior to my evaluation of the patient, he had been to the bathroom here in the emergency department and had 2 large bowel movements.  Reports that he now feels much better.  He has no abdominal pain.  The history is provided by the patient.    Past Medical History Past Medical History:  Diagnosis Date   Abnormal gait    due to parkinson's,  uses cane   Absolute anemia 03/13/2023   Allergic rhinitis    Allergy Spring 1998   Allergic to dustmits, dog hair and roaches.   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 Authorizing Provider  Amantadine HCl 100 MG tablet Take 100 mg by mouth 2 (two) times daily. 10/09/22   [provider]  carbidopa-levodopa (SINEMET IR) 25-100 MG tablet Take 3.5 tablets by mouth 3 (three) times daily.    [provider]  Carbidopa-Levodopa ER (SINEMET CR) 25-100 MG tablet controlled release Take 1 tablet by mouth at bedtime.     [provider]  Cholecalciferol (VITAMIN D3) 125 MCG (5000 UT) CAPS Take 5,000 Units by mouth in the morning.    [provider]  desonide (DESOWEN) 0.05 % ointment Apply 1 application topically daily as needed (irritation). 08/18/19   [provider]  diclofenac Sodium (VOLTAREN) 1 % GEL Apply 4 g topically 4 (four)  times daily as needed. 12/30/22   Garnette Gunner, MD  docusate sodium (COLACE) 100 MG capsule Take 100 mg by mouth daily as needed for moderate constipation.    [provider]  ipratropium (ATROVENT) 0.06 % nasal spray Place 2 sprays into both nostrils 2 (two) times daily as needed for rhinitis.    [provider]  levothyroxine (SYNTHROID, LEVOTHROID) 75 MCG tablet Take 75 mcg by mouth daily before breakfast.    [provider]  linaclotide (LINZESS) 290 MCG CAPS capsule Take 290 mcg by mouth daily as needed (constipation).    [provider]  losartan-hydrochlorothiazide (HYZAAR) 100-25 MG tablet Take  1 tablet by mouth daily. 03/13/23 03/07/24  Garnette Gunner, MD  MAGNESIUM PO Take by mouth. 06/05/18   [provider]  methocarbamol (ROBAXIN) 500 MG tablet Take 1 tablet (500 mg total) by mouth every 6 (six) hours as needed for muscle spasms. 09/11/21   Edmisten, Lyn Hollingshead, PA  Polyethylene Glycol 3350 (MIRALAX PO) Take by mouth every other day.    [provider]  pramipexole (MIRAPEX) 0.5 MG tablet Take 0.5 mg by mouth in the morning and at bedtime.    [provider]  vitamin B-12 (CYANOCOBALAMIN) 1000 MCG tablet Take 1,000 mcg by mouth daily.    [provider]                                                                                                                                    Allergies Other  Review of Systems Review of Systems As noted in HPI  Physical Exam Vital Signs  I have reviewed the triage vital signs BP 129/63   Pulse (!) 58   Temp 98.1 F (36.7 C)   Resp 18   Ht 5\' 8"  (1.727 m)   Wt 88.9 kg   SpO2 97%   BMI 29.80 kg/m  Physical Exam Vitals reviewed.  Constitutional:      General: He is not in acute distress.    Appearance: He is well-developed. He is not diaphoretic.  HENT:     Head: Normocephalic and atraumatic.     Right Ear: External ear normal.     Left Ear: External ear normal.     Nose: Nose normal.     Mouth/Throat:     Mouth: Mucous membranes are moist.  Eyes:     General: No scleral icterus.    Conjunctiva/sclera: Conjunctivae normal.  Neck:     Trachea: Phonation normal.  Cardiovascular:     Rate and Rhythm: Normal rate and regular rhythm.  Pulmonary:     Effort: Pulmonary effort is normal. No respiratory distress.     Breath sounds: No stridor.  Abdominal:     General: There is no distension.     Tenderness: There is no abdominal tenderness.  Musculoskeletal:        General: Normal range of motion.  Cervical back: Normal range of motion.  Neurological:     Mental Status: He is alert  and oriented to person, place, and time.  Psychiatric:        Behavior: Behavior normal.     ED Results and Treatments Labs (all labs ordered are listed, but only abnormal results are displayed) Labs Reviewed  COMPREHENSIVE METABOLIC PANEL - Abnormal; Notable for the following components:      Result Value   Glucose, Bld 109 (*)    All other components within normal limits  CBC WITH DIFFERENTIAL/PLATELET  URINALYSIS, ROUTINE W REFLEX MICROSCOPIC                                                                                                                         EKG  EKG Interpretation Date/Time:    Ventricular Rate:    PR Interval:    QRS Duration:    QT Interval:    QTC Calculation:   R Axis:      Text Interpretation:         Radiology No results found.  Medications Ordered in ED Medications - No data to display Procedures Procedures  (including critical care time) Medical Decision Making / ED Course   Medical Decision Making Amount and/or Complexity of Data Reviewed Labs: ordered. Decision-making details documented in ED Course.    Patient presents for constipation.  CBC and CMP are grossly reassuring.  Patient has had 2 large bowel movements here in the emergency department and feels better.  Abdomen is benign and no longer distended.  Doubt obstruction. No need for additional work up or intervention.    Final Clinical Impression(s) / ED Diagnoses Final diagnoses:  Constipation, unspecified constipation type   The patient appears reasonably screened and/or stabilized for discharge and I doubt any other medical condition or other [facility] requiring further screening, evaluation, or treatment in the ED at this time. I have discussed the findings, Dx and Tx plan with the patient/family who expressed understanding and agree(s) with the plan. Discharge instructions discussed at length. The patient/family was given strict return precautions who verbalized  understanding of the instructions. No further questions at time of discharge.  Disposition: Discharge  Condition: Good  ED Discharge Orders     None         Follow Up: Garnette Gunner, MD [address] [place] [address] [phone]  Call      This chart was dictated using voice recognition software.  Despite best efforts to proofread,  errors can occur which can change the documentation meaning.    Nira Conn, MD 06/23/23 [phone]

## 2023-06-24 LAB — URINALYSIS, ROUTINE W REFLEX MICROSCOPIC
Bilirubin Urine: NEGATIVE
Glucose, UA: NEGATIVE mg/dL
Hgb urine dipstick: NEGATIVE
Ketones, ur: NEGATIVE mg/dL
Leukocytes,Ua: NEGATIVE
Nitrite: NEGATIVE
Protein, ur: NEGATIVE mg/dL
Specific Gravity, Urine: >=1.03 (ref 1.005–1.030)
pH: 5.5 (ref 5.0–8.0)

## 2023-06-26 ENCOUNTER — Telehealth: Payer: Self-pay

## 2023-06-26 NOTE — Transitions of Care (Post Inpatient/ED Visit) (Signed)
   06/26/2023  Name: Jim Carson MRN: [id number] DOB: 12-May-1951  Today's TOC FU Call Status: Today's TOC FU Call Status:: Successful TOC FU Call Completed TOC FU Call Complete Date: 06/26/23 Patient's Name and Date of Birth confirmed.  Transition Care Management Follow-up Telephone Call Date of Discharge: 06/24/23 Discharge Facility: [facility] Type of Discharge: Emergency Department Reason for ED Visit: Other: How have you been since you were released from the hospital?: Better Any questions or concerns?: No  Items Reviewed: Did you receive and understand the discharge instructions provided?: Yes Medications obtained,verified, and reconciled?: No Medications Not Reviewed Reasons:: Other: Any new allergies since your discharge?: No Dietary orders reviewed?: NA Do you have support at home?: Yes  Medications Reviewed Today: Medications Reviewed Today   Medications were not reviewed in this encounter     Home Care and Equipment/Supplies: Were Home Health Services Ordered?: NA Any new equipment or medical supplies ordered?: NA  Functional Questionnaire: Do you need assistance with bathing/showering or dressing?: No Do you need assistance with meal preparation?: No Do you need assistance with eating?: No Do you have difficulty maintaining continence: No Do you need assistance with getting out of bed/getting out of a chair/moving?: No Do you have difficulty managing or taking your medications?: No  Follow up appointments reviewed: PCP Follow-up appointment confirmed?: NA Specialist Hospital Follow-up appointment confirmed?: NA Do you need transportation to your follow-up appointment?: No Do you understand care options if your condition(s) worsen?: Yes-patient verbalized understanding    Derenda Fennel, RMA

## 2023-07-30 ENCOUNTER — Ambulatory Visit: Payer: Medicare Other | Admitting: Physical Therapy

## 2023-09-11 ENCOUNTER — Ambulatory Visit: Payer: Medicare Other | Admitting: Family Medicine

## 2023-09-16 NOTE — H&P (Addendum)
 TOTAL HIP REVISION ADMISSION H&P  Subjective:  HPI: Jim Carson, 73 y.o. male, presents for pre-operative visit in preparation for their left hip constrained liner revision , which is scheduled on 10/01/2023 with Dr. Lequita Halt at [facility]. The patient has had symptoms in the left hip including pain which has impacted their quality of life and ability to do activities of daily living. 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 The patient denies an active infection.    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 Lorin Picket  @[facility]  Neurology   Pneumonia    Sleep apnea    Vitamin B12 deficiency    Vitamin D deficiency    Wears glasses     Past Surgical History:  Procedure Laterality Date   APPENDECTOMY  07/1964   JOINT REPLACEMENT  March 2019 and again on 09/10/2021   Left hip twice.  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-2016   @[facility] ,  [place]   desmoid fibromatosis   SHOULDER ARTHROSCOPY WITH ROTATOR CUFF REPAIR Left 12/15/2018   Procedure: SHOULDER ARTHROSCOPY, biceps tenodesis labored Debridement, submacromial decompression;  Surgeon: Eugenia Mcalpine, MD;  Location: [facility];  Service: Orthopedics;  Laterality: Left;  interscalene block   SPINE SURGERY     TOTAL HIP ARTHROPLASTY Left 09/18/2017   @[facility] , [place]   TOTAL HIP REVISION Left 09/10/2021   Procedure: Left hip acetabular versus total hip arthroplasty revision, constrained liner;  Surgeon: Ollen Gross, MD;  Location: [facility];  Service: Orthopedics;  Laterality: Left;    Prior to  Admission medications   Medication Sig Start Date End Date Taking? Authorizing Provider  Amantadine HCl 100 MG tablet Take 100 mg by mouth 2 (two) times daily. 10/09/22   [provider]  carbidopa-levodopa (SINEMET IR) 25-100 MG tablet Take 3.5 tablets by mouth 3 (three) times daily.    [provider]  Carbidopa-Levodopa ER (SINEMET CR) 25-100 MG tablet controlled release Take 1 tablet by mouth at bedtime.     [provider]  Cholecalciferol (VITAMIN D3) 125 MCG (5000 UT) CAPS Take 5,000 Units by mouth in the morning.     [provider]  desonide (DESOWEN) 0.05 % ointment Apply 1 application topically daily as needed (irritation). 08/18/19   [provider]  diclofenac Sodium (VOLTAREN) 1 % GEL Apply 4 g topically 4 (four) times daily as needed. 12/30/22   Garnette Gunner, MD  docusate sodium (COLACE) 100 MG capsule Take 100 mg by mouth daily as needed for moderate constipation.    [provider]  ipratropium (ATROVENT) 0.06 % nasal spray Place 2 sprays into both nostrils 2 (two) times daily as needed for rhinitis.    [provider]  levothyroxine (SYNTHROID, LEVOTHROID) 75 MCG tablet Take 75 mcg by mouth daily before breakfast.    [provider]  linaclotide (LINZESS) 290 MCG CAPS capsule Take 290 mcg by mouth daily as needed (constipation).    [provider]  losartan-hydrochlorothiazide (HYZAAR) 100-25 MG tablet Take 1 tablet by mouth daily. 03/13/23 03/07/24  Garnette Gunner, MD  MAGNESIUM PO Take by mouth. 06/05/18   [provider]  methocarbamol (ROBAXIN) 500 MG tablet Take 1 tablet (500 mg total) by mouth every 6 (six) hours as needed for muscle spasms. 09/11/21   Lamonica Trueba, Lyn Hollingshead, PA  Polyethylene Glycol 3350 (MIRALAX PO) Take by mouth every other day.    [provider]  pramipexole (MIRAPEX) 0.5 MG tablet Take 0.5 mg by mouth in the morning and at bedtime.    [provider]  vitamin B-12 (CYANOCOBALAMIN) 1000 MCG tablet Take 1,000 mcg by mouth daily.    [provider]    Allergies  Allergen Reactions   Other Itching and Rash    Other reaction(s): Other (See Comments) Dog hair, roaches, dust mites: very slight reaction: causes runny nose Dog hair, roaches, dust mites: very slight reaction: causes runny nose Dog hair, roaches, dust mites: very slight reaction: causes runny nose Dog hair, roaches, dust mites: very slight reaction: causes runny nose     Social History   Socioeconomic History   Marital  status: Married    Spouse name: Not on file   Number of children: Not on file   Years of education: Not on file   Highest education level: Bachelor's degree (e.g., BA, AB, BS)  Occupational History   Not on file  Tobacco Use   Smoking status: Never    Passive exposure: Past   Smokeless tobacco: Never  Vaping Use   Vaping status: Never Used  Substance and Sexual Activity   Alcohol use: Yes    Alcohol/week: 8.0 standard drinks of alcohol    Types: 7 Glasses of wine, 1 Cans of beer per week    Comment: I drink a glass of wine with diner.  I like beer with pizza   Drug use: Never   Sexual activity: Yes    Birth control/protection: None  Other Topics Concern   Not on file  Social History Narrative   ** Merged History  Encounter **       Social Drivers of Health   Financial Resource Strain: Low Risk  (03/07/2023)   Overall Financial Resource Strain (CARDIA)    Difficulty of Paying Living Expenses: Not hard at all  Food Insecurity: Low Risk  (06/06/2023)   Received from [facility]   Hunger Vital Sign    Worried About Running Out of Food in the Last Year: Never true    Ran Out of Food in the Last Year: Never true  Transportation Needs: No Transportation Needs (06/06/2023)   Received from [facility]    In the past 12 months, has lack of reliable transportation kept you from medical appointments, meetings, work or from getting things needed for daily living? : No  Physical Activity: Sufficiently Active (03/07/2023)   Exercise Vital Sign    Days of Exercise per Week: 3 days    Minutes of Exercise per Session: 60 min  Stress: No Stress Concern Present (03/07/2023)   [employer] of Occupational Health - Occupational Stress Questionnaire    Feeling of Stress : Not at all  Social Connections: Unknown (03/07/2023)   Social Connection and Isolation Panel [NHANES]    Frequency of Communication with Friends and Family: Patient declined    Frequency of Social  Gatherings with Friends and Family: Patient declined    Attends Religious Services: More than 4 times per year    Active Member of [employer] or Organizations: Yes    Attends Banker Meetings: More than 4 times per year    Marital Status: Married  Catering manager Violence: Not At Risk (02/20/2023)   Received from [facility]   HITS    Over the last 12 months how often did your partner physically hurt you?: Never    Over the last 12 months how often did your partner insult you or talk down to you?: Never    Over the last 12 months how often did your partner threaten you with physical harm?: Never    Over the last 12 months how often did your partner scream or curse at you?: Never    Tobacco Use: Low Risk  (08/13/2023)   Received from [facility]   Patient History    Smoking Tobacco Use: Never    Smokeless Tobacco Use: Never    Passive Exposure: Not on file   Social History   Substance and Sexual Activity  Alcohol Use Yes   Alcohol/week: 8.0 standard drinks of alcohol   Types: 7 Glasses of wine, 1 Cans of beer per week   Comment: I drink a glass of wine with diner.  I like beer with pizza    Family History  Problem Relation Age of Onset   Heart disease Mother    Alcohol abuse Father    Prostate cancer Brother    Cancer Brother     Review of Systems  Constitutional:  Negative for chills and fever.  HENT:  Negative for congestion, sore throat and tinnitus.   Eyes:  Negative for double vision, photophobia and pain.  Respiratory:  Negative for cough, shortness of breath and wheezing.   Cardiovascular:  Negative for chest pain, palpitations and orthopnea.  Gastrointestinal:  Negative for heartburn, nausea and vomiting.  Genitourinary:  Negative for dysuria, frequency and urgency.  Musculoskeletal:  Positive for joint pain.  Neurological:  Negative for dizziness, weakness and headaches.     Objective:  Physical Exam: Well nourished and well  developed.  General: Alert and oriented x3, cooperative and pleasant, no acute distress.  Head: normocephalic, atraumatic, neck supple.  Eyes: EOMI.  Musculoskeletal:  Left hip can be flexed to 100 degrees.  - Rotate 20 degrees in each direction.  - Abduct 30 degrees with slight discomfort.  Calves soft and nontender. Motor function intact in LE. Strength 5/5 LE bilaterally. Neuro: Distal pulses 2+. Sensation to light touch intact in LE.  Imaging Review Plain radiographs demonstrate severe degenerative joint disease of the left hip. The bone quality appears to be adequate for age and reported activity level.  Assessment/Plan:  End stage arthritis, left hip  The patient history, physical examination, clinical judgement of the provider and imaging studies are consistent with end stage degenerative joint disease of the left hip and total hip arthroplasty is deemed medically necessary. The treatment options including medical management, injection therapy, arthroscopy and arthroplasty were discussed at length. The risks and benefits of total hip arthroplasty were presented and reviewed. The risks due to aseptic loosening, infection, stiffness, dislocation/subluxation, thromboembolic complications and other imponderables were discussed. The patient acknowledged the explanation, agreed to proceed with the plan and consent was signed. Patient is being admitted for inpatient treatment for surgery, pain control, PT, OT, prophylactic antibiotics, VTE prophylaxis, progressive ambulation and ADLs and discharge planning.The patient is planning to be discharged  home .  Therapy Plans: HEP Disposition: Home with wife Planned DVT Prophylaxis: Aspirin 81 mg BID DME Needed: None PCP: Rocky Link, NP (clearance received) TXA: IV Allergies: NKDA Anesthesia Concerns: None BMI: 33 Last HgbA1c: Not diabetic Pain Regimen: Hydrocodone, tramadol Pharmacy: [facility] (have brought to room)  - Patient was  instructed on what medications to stop prior to surgery. - Follow-up visit in 2 weeks with Dr. Lequita Halt - Begin physical therapy following surgery - Pre-operative lab work as pre-surgical testing - Prescriptions will be provided in hospital at time of discharge  Arther Abbott, PA-C Orthopedic Surgery [facility] [place]

## 2023-09-22 NOTE — Patient Instructions (Signed)
 SURGICAL WAITING ROOM VISITATION  Patients having surgery or a procedure may have no more than 2 support people in the waiting area - these visitors may rotate.    Children under the age of 3 must have an adult with them who is not the patient.  Due to an increase in RSV and influenza rates and associated hospitalizations, children ages 67 and under may not visit patients in [facility] hospitals.  Visitors with respiratory illnesses are discouraged from visiting and should remain at home.  If the patient needs to stay at the hospital during part of their recovery, the visitor guidelines for inpatient rooms apply. Pre-op nurse will coordinate an appropriate time for 1 support person to accompany patient in pre-op.  This support person may not rotate.    Please refer to the [facility] website for the visitor guidelines for Inpatients (after your surgery is over and you are in a regular room).       Your procedure is scheduled on:  10/01/2023    Report to [facility] Main Entrance    Report to admitting at  1130 AM   Call this number if you have problems the morning of surgery [phone]   Do not eat food :After Midnight.   After Midnight you may have the following liquids until _ 1100_____ AM DAY OF SURGERY  Water Non-Citrus Juices (without pulp, NO RED-Apple, White grape, White cranberry) Black Coffee (NO MILK/CREAM OR CREAMERS, sugar ok)  Clear Tea (NO MILK/CREAM OR CREAMERS, sugar ok) regular and decaf                             Plain Jell-O (NO RED)                                           Fruit ices (not with fruit pulp, NO RED)                                     Popsicles (NO RED)                                                               Sports drinks like Gatorade (N       The day of surgery:  Drink ONE (1) Pre-Surgery Clear Ensure or G2 at   1100AM the morning of surgery. Drink in one sitting. Do not sip.  This drink was given to you during your  hospital  pre-op appointment visit. Nothing else to drink after completing the  Pre-Surgery Clear Ensure or G2.          If you have questions, please contact your surgeon's office.      Oral Hygiene is also important to reduce your risk of infection.                                    Remember - BRUSH YOUR TEETH THE MORNING OF SURGERY WITH YOUR REGULAR TOOTHPASTE  DENTURES WILL BE  REMOVED PRIOR TO SURGERY PLEASE DO NOT APPLY "Poly grip" OR ADHESIVES!!!   Do NOT smoke after Midnight   Stop all vitamins and herbal supplements 7 days before surgery.   Take these medicines the morning of surgery with A SIP OF WATER:  sinemet, nasal spray, synthroid   DO NOT TAKE ANY ORAL DIABETIC MEDICATIONS DAY OF YOUR SURGERY  Bring CPAP mask and tubing day of surgery.                              You may not have any metal on your body including hair pins, jewelry, and body piercing             Do not wear make-up, lotions, powders, perfumes/cologne, or deodorant  Do not wear nail polish including gel and S&S, artificial/acrylic nails, or any other type of covering on natural nails including finger and toenails. If you have artificial nails, gel coating, etc. that needs to be removed by a nail salon please have this removed prior to surgery or surgery may need to be canceled/ delayed if the surgeon/ anesthesia feels like they are unable to be safely monitored.   Do not shave  48 hours prior to surgery.               Men may shave face and neck.   Do not bring valuables to the hospital.  IS NOT             RESPONSIBLE   FOR VALUABLES.   Contacts, glasses, dentures or bridgework may not be worn into surgery.   Bring small overnight bag day of surgery.   DO NOT BRING YOUR HOME MEDICATIONS TO THE HOSPITAL. PHARMACY WILL DISPENSE MEDICATIONS LISTED ON YOUR MEDICATION LIST TO YOU DURING YOUR ADMISSION IN THE HOSPITAL!    Patients discharged on the day of surgery will not be allowed to  drive home.  Someone NEEDS to stay with you for the first 24 hours after anesthesia.   Special Instructions: Bring a copy of your healthcare power of attorney and living will documents the day of surgery if you haven't scanned them before.              Please read over the following fact sheets you were given: IF YOU HAVE QUESTIONS ABOUT YOUR PRE-OP INSTRUCTIONS PLEASE CALL [phone]   If you received a COVID test during your pre-op visit  it is requested that you wear a mask when out in public, stay away from anyone that may not be feeling well and notify your surgeon if you develop symptoms. If you test positive for Covid or have been in contact with anyone that has tested positive in the last 10 days please notify you surgeon.      Pre-operative 5 CHG Bath Instructions   You can play a key role in reducing the risk of infection after surgery. Your skin needs to be as free of germs as possible. You can reduce the number of germs on your skin by washing with CHG (chlorhexidine gluconate) soap before surgery. CHG is an antiseptic soap that kills germs and continues to kill germs even after washing.   DO NOT use if you have an allergy to chlorhexidine/CHG or antibacterial soaps. If your skin becomes reddened or irritated, stop using the CHG and notify one of our RNs at [phone].   Please shower with the CHG soap starting 4 days before surgery  using the following schedule:     Please keep in mind the following:  DO NOT shave, including legs and underarms, starting the day of your first shower.   You may shave your face at any point before/day of surgery.  Place clean sheets on your bed the day you start using CHG soap. Use a clean washcloth (not used since being washed) for each shower. DO NOT sleep with pets once you start using the CHG.   CHG Shower Instructions:  If you choose to wash your hair and private area, wash first with your normal shampoo/soap.  After you use  shampoo/soap, rinse your hair and body thoroughly to remove shampoo/soap residue.  Turn the water OFF and apply about 3 tablespoons (45 ml) of CHG soap to a CLEAN washcloth.  Apply CHG soap ONLY FROM YOUR NECK DOWN TO YOUR TOES (washing for 3-5 minutes)  DO NOT use CHG soap on face, private areas, open wounds, or sores.  Pay special attention to the area where your surgery is being performed.  If you are having back surgery, having someone wash your back for you may be helpful. Wait 2 minutes after CHG soap is applied, then you may rinse off the CHG soap.  Pat dry with a clean towel  Put on clean clothes/pajamas   If you choose to wear lotion, please use ONLY the CHG-compatible lotions on the back of this paper.     Additional instructions for the day of surgery: DO NOT APPLY any lotions, deodorants, cologne, or perfumes.   Put on clean/comfortable clothes.  Brush your teeth.  Ask your nurse before applying any prescription medications to the skin.      CHG Compatible Lotions   Aveeno Moisturizing lotion  Cetaphil Moisturizing Cream  Cetaphil Moisturizing Lotion  Clairol Herbal Essence Moisturizing Lotion, Dry Skin  Clairol Herbal Essence Moisturizing Lotion, Extra Dry Skin  Clairol Herbal Essence Moisturizing Lotion, Normal Skin  Curel Age Defying Therapeutic Moisturizing Lotion with Alpha Hydroxy  Curel Extreme Care Body Lotion  Curel Soothing Hands Moisturizing Hand Lotion  Curel Therapeutic Moisturizing Cream, Fragrance-Free  Curel Therapeutic Moisturizing Lotion, Fragrance-Free  Curel Therapeutic Moisturizing Lotion, Original Formula  Eucerin Daily Replenishing Lotion  Eucerin Dry Skin Therapy Plus Alpha Hydroxy Crme  Eucerin Dry Skin Therapy Plus Alpha Hydroxy Lotion  Eucerin Original Crme  Eucerin Original Lotion  Eucerin Plus Crme Eucerin Plus Lotion  Eucerin TriLipid Replenishing Lotion  Keri Anti-Bacterial Hand Lotion  Keri Deep Conditioning Original Lotion Dry  Skin Formula Softly Scented  Keri Deep Conditioning Original Lotion, Fragrance Free Sensitive Skin Formula  Keri Lotion Fast Absorbing Fragrance Free Sensitive Skin Formula  Keri Lotion Fast Absorbing Softly Scented Dry Skin Formula  Keri Original Lotion  Keri Skin Renewal Lotion Keri Silky Smooth Lotion  Keri Silky Smooth Sensitive Skin Lotion  Nivea Body Creamy Conditioning Oil  Nivea Body Extra Enriched Teacher, adult education Moisturizing Lotion Nivea Crme  Nivea Skin Firming Lotion  NutraDerm 30 Skin Lotion  NutraDerm Skin Lotion  NutraDerm Therapeutic Skin Cream  NutraDerm Therapeutic Skin Lotion  ProShield Protective Hand Cream  Provon moisturizing lotion

## 2023-09-22 NOTE — Progress Notes (Signed)
 Anesthesia Review:  PCP: Bufford Spikes  Cardiologist : Neuro- 08/13/23-LOV Carrie Mew- Dystonia  Pulmonology- 06/06/23- Initial consult Adrew  NAier   PPM/ ICD: Device Orders: Rep Notified:  Chest x-ray : EKG : Echo : Stress test: Cardiac Cath :   Activity level:  Sleep Study/ CPAP : Fasting Blood Sugar :      / Checks Blood Sugar -- times a day:    Blood Thinner/ Instructions /Last Dose: ASA / Instructions/ Last Dose :

## 2023-09-24 ENCOUNTER — Encounter (HOSPITAL_COMMUNITY): Payer: Self-pay

## 2023-09-24 ENCOUNTER — Encounter (HOSPITAL_COMMUNITY)
Admission: RE | Admit: 2023-09-24 | Discharge: 2023-09-24 | Disposition: A | Discharge status: Home / Self Care | Payer: Medicare Other | Source: Ambulatory Visit | Attending: Orthopedic Surgery | Admitting: Orthopedic Surgery

## 2023-09-24 ENCOUNTER — Other Ambulatory Visit: Payer: Self-pay

## 2023-09-24 VITALS — BP 116/58 | HR 52 | Temp 97.8°F | Resp 16 | Ht 68.0 in | Wt 192.0 lb

## 2023-09-24 DIAGNOSIS — G20A1 Parkinson's disease without dyskinesia, without mention of fluctuations: Secondary | ICD-10-CM | POA: Insufficient documentation

## 2023-09-24 DIAGNOSIS — G4733 Obstructive sleep apnea (adult) (pediatric): Secondary | ICD-10-CM | POA: Diagnosis not present

## 2023-09-24 DIAGNOSIS — D696 Thrombocytopenia, unspecified: Secondary | ICD-10-CM | POA: Insufficient documentation

## 2023-09-24 DIAGNOSIS — Z981 Arthrodesis status: Secondary | ICD-10-CM | POA: Insufficient documentation

## 2023-09-24 DIAGNOSIS — I10 Essential (primary) hypertension: Secondary | ICD-10-CM | POA: Insufficient documentation

## 2023-09-24 DIAGNOSIS — G242 Idiopathic nonfamilial dystonia: Secondary | ICD-10-CM | POA: Insufficient documentation

## 2023-09-24 DIAGNOSIS — Z01818 Encounter for other preprocedural examination: Secondary | ICD-10-CM | POA: Diagnosis present

## 2023-09-24 DIAGNOSIS — R001 Bradycardia, unspecified: Secondary | ICD-10-CM | POA: Insufficient documentation

## 2023-09-24 LAB — CBC
HCT: 43.6 % (ref 39.0–52.0)
Hemoglobin: 14.5 g/dL (ref 13.0–17.0)
MCH: 30.3 pg (ref 26.0–34.0)
MCHC: 33.3 g/dL (ref 30.0–36.0)
MCV: 91.2 fL (ref 80.0–100.0)
Platelets: 153 10*3/uL (ref 150–400)
RBC: 4.78 MIL/uL (ref 4.22–5.81)
RDW: 13 % (ref 11.5–15.5)
WBC: 6.6 10*3/uL (ref 4.0–10.5)
nRBC: 0 % (ref 0.0–0.2)

## 2023-09-24 LAB — SURGICAL PCR SCREEN
MRSA, PCR: NEGATIVE
Staphylococcus aureus: NEGATIVE

## 2023-09-24 LAB — BASIC METABOLIC PANEL
Anion gap: 9 (ref 5–15)
BUN: 17 mg/dL (ref 8–23)
CO2: 25 mmol/L (ref 22–32)
Calcium: 9.4 mg/dL (ref 8.9–10.3)
Chloride: 104 mmol/L (ref 98–111)
Creatinine, Ser: 0.85 mg/dL (ref 0.61–1.24)
GFR, Estimated: >60 mL/min (ref >60)
Glucose, Bld: 105 mg/dL — ABNORMAL HIGH (ref 70–99)
Potassium: 3.8 mmol/L (ref 3.5–5.1)
Sodium: 138 mmol/L (ref 135–145)

## 2023-09-25 NOTE — Anesthesia Preprocedure Evaluation (Addendum)
 Anesthesia Evaluation  Patient identified by MRN, date of birth, ID band Patient awake    Reviewed: Allergy & Precautions, NPO status , Patient's Chart, lab work & pertinent test results, reviewed documented beta blocker date and time   History of Anesthesia Complications Negative for: history of anesthetic complications  Airway Mallampati: II  TM Distance: >3 FB Neck ROM: Limited    Dental no notable dental hx.    Pulmonary sleep apnea and Continuous Positive Airway Pressure Ventilation , pneumonia, neg COPD   breath sounds clear to auscultation       Cardiovascular hypertension, (-) CAD, (-) Past MI, (-) Cardiac Stents, (-) CABG, (-) Peripheral Vascular Disease, (-) CHF, (-) PND, (-) DOE and (-) DVT + dysrhythmias (-) pacemaker(-) Cardiac Defibrillator (-) Valvular Problems/Murmurs Rhythm:Regular Rate:Normal     Neuro/Psych neg Headaches, neg Seizures Parkinson's disease  Neuromuscular disease    GI/Hepatic ,GERD  ,,(+) neg Cirrhosis        Endo/Other  Hypothyroidism    Renal/GU Renal disease     Musculoskeletal  (+) Arthritis , Osteoarthritis,    Abdominal   Peds  Hematology  (+) Blood dyscrasia, anemia   Anesthesia Other Findings   Reproductive/Obstetrics                              Anesthesia Physical Anesthesia Plan  ASA: 3  Anesthesia Plan: Spinal   Post-op Pain Management:    Induction: Intravenous  PONV Risk Score and Plan: 1 and Ondansetron and Propofol infusion  Airway Management Planned: Natural Airway and Simple Face Mask  Additional Equipment:   Intra-op Plan:   Post-operative Plan:   Informed Consent: I have reviewed the patients History and Physical, chart, labs and discussed the procedure including the risks, benefits and alternatives for the proposed anesthesia with the patient or authorized representative who has indicated his/her understanding and  acceptance.     Dental advisory given  Plan Discussed with: CRNA  Anesthesia Plan Comments: (PAT note by Antionette Poles, PA-C: 73 year old with pertinent history including GERD, hypothyroid, severe OSA on CPAP, HTN, Parkinson's disease, chronic thrombocytopenia, s/p posterior cervical fusion C3-T1.  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  Also maintained on carbidopa levodopa, amantadine, and pramipexole.  Patient seen by PCP Rocky Link, NP on 09/22/2023 for preop evaluation.  Per note, "Patient is a moderate medical risk for moderate surgical procedure. Patient is stable at the clinical baseline and optimized for surgery from an IM perspective."  Preop labs reviewed, WNL.  EKG 09/24/2023: Sinus bradycardia.  Rate 53.  )         Anesthesia Quick Evaluation

## 2023-09-25 NOTE — Progress Notes (Signed)
 Anesthesia Chart Review:  73 year old with pertinent history including GERD, hypothyroid, severe OSA on CPAP, HTN, Parkinson's disease, chronic thrombocytopenia, s/p posterior cervical fusion C3-T1.  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  Also maintained on carbidopa levodopa, amantadine, and pramipexole.  Patient seen by PCP Rocky Link, NP on 09/22/2023 for preop evaluation.  Per note, "Patient is a moderate medical risk for moderate surgical procedure. Patient is stable at the clinical baseline and optimized for surgery from an IM perspective."  Preop labs reviewed, WNL.  EKG 09/24/2023: Sinus bradycardia.  Rate 53.   Zannie Cove [facility] Short Stay Center/Anesthesiology Phone [phone] 09/25/2023 10:07 AM

## 2023-10-01 ENCOUNTER — Other Ambulatory Visit: Payer: Self-pay

## 2023-10-01 ENCOUNTER — Encounter (HOSPITAL_COMMUNITY): Payer: Self-pay | Admitting: Orthopedic Surgery

## 2023-10-01 ENCOUNTER — Inpatient Hospital Stay (HOSPITAL_COMMUNITY)

## 2023-10-01 ENCOUNTER — Inpatient Hospital Stay (HOSPITAL_COMMUNITY): Payer: Self-pay | Admitting: Physician Assistant

## 2023-10-01 ENCOUNTER — Inpatient Hospital Stay (HOSPITAL_COMMUNITY)
Admission: RE | Admit: 2023-10-01 | Discharge: 2023-10-02 | DRG: 468 | Disposition: A | Discharge status: Home / Self Care | Payer: Medicare Other | Attending: Orthopedic Surgery | Admitting: Orthopedic Surgery

## 2023-10-01 ENCOUNTER — Inpatient Hospital Stay (HOSPITAL_COMMUNITY): Payer: Self-pay

## 2023-10-01 ENCOUNTER — Encounter (HOSPITAL_COMMUNITY)
Admission: RE | Disposition: A | Discharge status: Home / Self Care | Payer: Self-pay | Source: Home / Self Care | Attending: Orthopedic Surgery

## 2023-10-01 DIAGNOSIS — Z8249 Family history of ischemic heart disease and other diseases of the circulatory system: Secondary | ICD-10-CM | POA: Diagnosis not present

## 2023-10-01 DIAGNOSIS — E039 Hypothyroidism, unspecified: Secondary | ICD-10-CM | POA: Diagnosis present

## 2023-10-01 DIAGNOSIS — Z79890 Hormone replacement therapy (postmenopausal): Secondary | ICD-10-CM

## 2023-10-01 DIAGNOSIS — M1612 Unilateral primary osteoarthritis, left hip: Secondary | ICD-10-CM | POA: Diagnosis present

## 2023-10-01 DIAGNOSIS — Z87820 Personal history of traumatic brain injury: Secondary | ICD-10-CM | POA: Diagnosis not present

## 2023-10-01 DIAGNOSIS — Y792 Prosthetic and other implants, materials and accessory orthopedic devices associated with adverse incidents: Secondary | ICD-10-CM | POA: Diagnosis present

## 2023-10-01 DIAGNOSIS — Z85828 Personal history of other malignant neoplasm of skin: Secondary | ICD-10-CM

## 2023-10-01 DIAGNOSIS — Z8042 Family history of malignant neoplasm of prostate: Secondary | ICD-10-CM

## 2023-10-01 DIAGNOSIS — Z811 Family history of alcohol abuse and dependence: Secondary | ICD-10-CM

## 2023-10-01 DIAGNOSIS — I10 Essential (primary) hypertension: Secondary | ICD-10-CM | POA: Diagnosis present

## 2023-10-01 DIAGNOSIS — T84018A Broken internal joint prosthesis, other site, initial encounter: Secondary | ICD-10-CM | POA: Diagnosis present

## 2023-10-01 DIAGNOSIS — J189 Pneumonia, unspecified organism: Secondary | ICD-10-CM

## 2023-10-01 DIAGNOSIS — G20A1 Parkinson's disease without dyskinesia, without mention of fluctuations: Secondary | ICD-10-CM | POA: Diagnosis present

## 2023-10-01 DIAGNOSIS — T84091A Other mechanical complication of internal left hip prosthesis, initial encounter: Secondary | ICD-10-CM

## 2023-10-01 DIAGNOSIS — Z96642 Presence of left artificial hip joint: Secondary | ICD-10-CM | POA: Diagnosis present

## 2023-10-01 DIAGNOSIS — Z01818 Encounter for other preprocedural examination: Secondary | ICD-10-CM

## 2023-10-01 DIAGNOSIS — G4733 Obstructive sleep apnea (adult) (pediatric): Secondary | ICD-10-CM | POA: Diagnosis present

## 2023-10-01 DIAGNOSIS — T84021A Dislocation of internal left hip prosthesis, initial encounter: Principal | ICD-10-CM | POA: Diagnosis present

## 2023-10-01 DIAGNOSIS — Z79899 Other long term (current) drug therapy: Secondary | ICD-10-CM

## 2023-10-01 DIAGNOSIS — Y838 Other surgical procedures as the cause of abnormal reaction of the patient, or of later complication, without mention of misadventure at the time of the procedure: Secondary | ICD-10-CM | POA: Diagnosis present

## 2023-10-01 DIAGNOSIS — T84011A Broken internal left hip prosthesis, initial encounter: Principal | ICD-10-CM

## 2023-10-01 HISTORY — PX: TOTAL HIP REVISION: SHX763

## 2023-10-01 LAB — TYPE AND SCREEN
ABO/RH(D): O POS
Antibody Screen: NEGATIVE

## 2023-10-01 LAB — ABO/RH: ABO/RH(D): O POS

## 2023-10-01 SURGERY — TOTAL HIP REVISION
Anesthesia: Spinal | Site: Hip | Laterality: Left

## 2023-10-01 MED ORDER — PHENYLEPHRINE HCL-NACL 20-0.9 MG/250ML-% IV SOLN
INTRAVENOUS | Status: DC | PRN
  Administered 2023-10-01: 35 ug/min via INTRAVENOUS
  Administered 2023-10-01: 50 ug/min via INTRAVENOUS

## 2023-10-01 MED ORDER — GLYCOPYRROLATE 0.2 MG/ML IJ SOLN
INTRAMUSCULAR | Status: DC | PRN
Start: 2023-10-01 — End: 2023-10-01
  Administered 2023-10-01 (×2): .1 mg via INTRAVENOUS

## 2023-10-01 MED ORDER — HYDROCHLOROTHIAZIDE 25 MG PO TABS
25.0000 mg | ORAL_TABLET | Freq: Every day | ORAL | Status: DC
  Administered 2023-10-02: 25 mg via ORAL
  Filled 2023-10-01: qty 1

## 2023-10-01 MED ORDER — EPHEDRINE 5 MG/ML INJ
INTRAVENOUS | Status: AC
  Filled 2023-10-01: qty 5

## 2023-10-01 MED ORDER — METOCLOPRAMIDE HCL 5 MG/ML IJ SOLN: 5.0000 mg | Freq: Three times a day (TID) | INTRAMUSCULAR | Status: DC | PRN

## 2023-10-01 MED ORDER — LEVOTHYROXINE SODIUM 75 MCG PO TABS
75.0000 ug | ORAL_TABLET | Freq: Every day | ORAL | Status: DC
  Administered 2023-10-02: 75 ug via ORAL
  Filled 2023-10-01: qty 1

## 2023-10-01 MED ORDER — BUPIVACAINE IN DEXTROSE 0.75-8.25 % IT SOLN
INTRATHECAL | Status: DC | PRN
Start: 2023-10-01 — End: 2023-10-01
  Administered 2023-10-01: 1.7 mg via INTRATHECAL

## 2023-10-01 MED ORDER — CEFAZOLIN SODIUM-DEXTROSE 2-4 GM/100ML-% IV SOLN
2.0000 g | Freq: Four times a day (QID) | INTRAVENOUS | Status: AC
  Administered 2023-10-01 – 2023-10-02 (×2): 2 g via INTRAVENOUS
  Filled 2023-10-01 (×2): qty 100

## 2023-10-01 MED ORDER — SODIUM CHLORIDE 0.9 % IV SOLN: INTRAVENOUS | Status: DC

## 2023-10-01 MED ORDER — BISACODYL 10 MG RE SUPP: 10.0000 mg | Freq: Every day | RECTAL | Status: DC | PRN

## 2023-10-01 MED ORDER — CEFAZOLIN SODIUM-DEXTROSE 2-4 GM/100ML-% IV SOLN
2.0000 g | INTRAVENOUS | Status: AC
  Administered 2023-10-01: 2 g via INTRAVENOUS
  Filled 2023-10-01: qty 100

## 2023-10-01 MED ORDER — OXYCODONE HCL 5 MG/5ML PO SOLN: 5.0000 mg | Freq: Once | ORAL | Status: DC | PRN

## 2023-10-01 MED ORDER — METHOCARBAMOL 500 MG PO TABS
500.0000 mg | ORAL_TABLET | Freq: Four times a day (QID) | ORAL | Status: DC | PRN
  Administered 2023-10-01 – 2023-10-02 (×2): 500 mg via ORAL
  Filled 2023-10-01 (×2): qty 1

## 2023-10-01 MED ORDER — DEXAMETHASONE SODIUM PHOSPHATE 10 MG/ML IJ SOLN
INTRAMUSCULAR | Status: AC
  Filled 2023-10-01: qty 1

## 2023-10-01 MED ORDER — POLYETHYLENE GLYCOL 3350 17 G PO PACK: 17.0000 g | PACK | Freq: Every day | ORAL | Status: DC | PRN

## 2023-10-01 MED ORDER — GLYCOPYRROLATE 0.2 MG/ML IJ SOLN
INTRAMUSCULAR | Status: AC
  Filled 2023-10-01: qty 1

## 2023-10-01 MED ORDER — ASPIRIN 81 MG PO CHEW
81.0000 mg | CHEWABLE_TABLET | Freq: Two times a day (BID) | ORAL | Status: DC
  Administered 2023-10-02: 81 mg via ORAL
  Filled 2023-10-01: qty 1

## 2023-10-01 MED ORDER — EPHEDRINE SULFATE-NACL 50-0.9 MG/10ML-% IV SOSY
PREFILLED_SYRINGE | INTRAVENOUS | Status: DC | PRN
Start: 2023-10-01 — End: 2023-10-01
  Administered 2023-10-01 (×2): 5 mg via INTRAVENOUS

## 2023-10-01 MED ORDER — BUPIVACAINE-EPINEPHRINE (PF) 0.25% -1:200000 IJ SOLN
INTRAMUSCULAR | Status: AC
Start: 2023-10-01 — End: ?
  Filled 2023-10-01: qty 30

## 2023-10-01 MED ORDER — BUPIVACAINE-EPINEPHRINE (PF) 0.25% -1:200000 IJ SOLN
INTRAMUSCULAR | Status: DC | PRN
Start: 2023-10-01 — End: 2023-10-01
  Administered 2023-10-01: 30 mL via PERINEURAL

## 2023-10-01 MED ORDER — METOCLOPRAMIDE HCL 5 MG PO TABS: 5.0000 mg | ORAL_TABLET | Freq: Three times a day (TID) | ORAL | Status: DC | PRN

## 2023-10-01 MED ORDER — ONDANSETRON HCL 4 MG/2ML IJ SOLN: 4.0000 mg | Freq: Four times a day (QID) | INTRAMUSCULAR | Status: DC | PRN

## 2023-10-01 MED ORDER — ORAL CARE MOUTH RINSE: 15.0000 mL | Freq: Once | OROMUCOSAL | Status: AC

## 2023-10-01 MED ORDER — TRAMADOL HCL 50 MG PO TABS
50.0000 mg | ORAL_TABLET | Freq: Four times a day (QID) | ORAL | Status: DC | PRN
  Administered 2023-10-01: 100 mg via ORAL
  Filled 2023-10-01: qty 2

## 2023-10-01 MED ORDER — MORPHINE SULFATE (PF) 2 MG/ML IV SOLN: 0.5000 mg | INTRAVENOUS | Status: DC | PRN

## 2023-10-01 MED ORDER — LINACLOTIDE 145 MCG PO CAPS: 290.0000 ug | ORAL_CAPSULE | Freq: Every day | ORAL | Status: DC | PRN

## 2023-10-01 MED ORDER — ONDANSETRON HCL 4 MG/2ML IJ SOLN: 4.0000 mg | Freq: Once | INTRAMUSCULAR | Status: DC | PRN

## 2023-10-01 MED ORDER — LOSARTAN POTASSIUM 50 MG PO TABS
100.0000 mg | ORAL_TABLET | Freq: Every day | ORAL | Status: DC
  Administered 2023-10-02: 100 mg via ORAL
  Filled 2023-10-01: qty 2

## 2023-10-01 MED ORDER — HYDROCODONE-ACETAMINOPHEN 5-325 MG PO TABS
1.0000 | ORAL_TABLET | ORAL | Status: DC | PRN
  Administered 2023-10-02: 1 via ORAL
  Filled 2023-10-01: qty 1
  Filled 2023-10-01: qty 2

## 2023-10-01 MED ORDER — CARBIDOPA-LEVODOPA 25-100 MG PO TABS
3.5000 | ORAL_TABLET | Freq: Three times a day (TID) | ORAL | Status: DC
  Administered 2023-10-01 – 2023-10-02 (×2): 3.5 via ORAL
  Filled 2023-10-01 (×2): qty 4

## 2023-10-01 MED ORDER — LACTATED RINGERS IV SOLN: INTRAVENOUS | Status: DC

## 2023-10-01 MED ORDER — PROPOFOL 500 MG/50ML IV EMUL
INTRAVENOUS | Status: AC
  Filled 2023-10-01: qty 50

## 2023-10-01 MED ORDER — DOCUSATE SODIUM 100 MG PO CAPS
100.0000 mg | ORAL_CAPSULE | Freq: Two times a day (BID) | ORAL | Status: DC
Start: 2023-10-01 — End: 2023-10-02
  Administered 2023-10-02: 100 mg via ORAL
  Filled 2023-10-01 (×2): qty 1

## 2023-10-01 MED ORDER — SODIUM CHLORIDE 0.9 % IR SOLN
Status: DC | PRN
Start: 2023-10-01 — End: 2023-10-01
  Administered 2023-10-01: 1000 mL

## 2023-10-01 MED ORDER — AMANTADINE HCL 100 MG PO CAPS
100.0000 mg | ORAL_CAPSULE | Freq: Two times a day (BID) | ORAL | Status: DC
  Administered 2023-10-01 – 2023-10-02 (×2): 100 mg via ORAL
  Filled 2023-10-01 (×3): qty 1

## 2023-10-01 MED ORDER — PHENOL 1.4 % MT LIQD: 1.0000 | OROMUCOSAL | Status: DC | PRN

## 2023-10-01 MED ORDER — ONDANSETRON HCL 4 MG/2ML IJ SOLN
INTRAMUSCULAR | Status: AC
  Filled 2023-10-01: qty 2

## 2023-10-01 MED ORDER — OXYCODONE HCL 5 MG PO TABS: 5.0000 mg | ORAL_TABLET | Freq: Once | ORAL | Status: DC | PRN

## 2023-10-01 MED ORDER — LOSARTAN POTASSIUM-HCTZ 100-25 MG PO TABS: 1.0000 | ORAL_TABLET | Freq: Every day | ORAL | Status: DC

## 2023-10-01 MED ORDER — ACETAMINOPHEN 325 MG PO TABS: 325.0000 mg | ORAL_TABLET | Freq: Four times a day (QID) | ORAL | Status: DC | PRN

## 2023-10-01 MED ORDER — ACETAMINOPHEN 10 MG/ML IV SOLN
1000.0000 mg | Freq: Four times a day (QID) | INTRAVENOUS | Status: DC
  Administered 2023-10-01: 1000 mg via INTRAVENOUS
  Filled 2023-10-01: qty 100

## 2023-10-01 MED ORDER — ONDANSETRON HCL 4 MG PO TABS: 4.0000 mg | ORAL_TABLET | Freq: Four times a day (QID) | ORAL | Status: DC | PRN

## 2023-10-01 MED ORDER — ONDANSETRON HCL 4 MG/2ML IJ SOLN
INTRAMUSCULAR | Status: DC | PRN
  Administered 2023-10-01: 4 mg via INTRAVENOUS

## 2023-10-01 MED ORDER — TRANEXAMIC ACID-NACL 1000-0.7 MG/100ML-% IV SOLN
1000.0000 mg | INTRAVENOUS | Status: AC
  Administered 2023-10-01: 1000 mg via INTRAVENOUS
  Filled 2023-10-01: qty 100

## 2023-10-01 MED ORDER — METHOCARBAMOL 1000 MG/10ML IJ SOLN: 500.0000 mg | Freq: Four times a day (QID) | INTRAMUSCULAR | Status: DC | PRN

## 2023-10-01 MED ORDER — PROPOFOL 1000 MG/100ML IV EMUL
INTRAVENOUS | Status: AC
  Filled 2023-10-01: qty 100

## 2023-10-01 MED ORDER — CARBIDOPA-LEVODOPA ER 25-100 MG PO TBCR
1.0000 | EXTENDED_RELEASE_TABLET | Freq: Every day | ORAL | Status: DC
Start: 2023-10-01 — End: 2023-10-02
  Administered 2023-10-01: 1 via ORAL
  Filled 2023-10-01 (×2): qty 1

## 2023-10-01 MED ORDER — DEXAMETHASONE SODIUM PHOSPHATE 10 MG/ML IJ SOLN
10.0000 mg | Freq: Once | INTRAMUSCULAR | Status: AC
  Administered 2023-10-02: 10 mg via INTRAVENOUS
  Filled 2023-10-01: qty 1

## 2023-10-01 MED ORDER — 0.9 % SODIUM CHLORIDE (POUR BTL) OPTIME
TOPICAL | Status: DC | PRN
  Administered 2023-10-01: 1000 mL

## 2023-10-01 MED ORDER — FENTANYL CITRATE PF 50 MCG/ML IJ SOSY: 25.0000 ug | PREFILLED_SYRINGE | INTRAMUSCULAR | Status: DC | PRN

## 2023-10-01 MED ORDER — ACETAMINOPHEN 10 MG/ML IV SOLN: 1000.0000 mg | Freq: Once | INTRAVENOUS | Status: DC | PRN

## 2023-10-01 MED ORDER — DEXAMETHASONE SODIUM PHOSPHATE 10 MG/ML IJ SOLN
8.0000 mg | Freq: Once | INTRAMUSCULAR | Status: AC
  Administered 2023-10-01: 8 mg via INTRAVENOUS

## 2023-10-01 MED ORDER — PRAMIPEXOLE DIHYDROCHLORIDE 0.25 MG PO TABS
0.5000 mg | ORAL_TABLET | Freq: Two times a day (BID) | ORAL | Status: DC
  Administered 2023-10-01 – 2023-10-02 (×2): 0.5 mg via ORAL
  Filled 2023-10-01 (×2): qty 2

## 2023-10-01 MED ORDER — MAGNESIUM CITRATE PO SOLN: 1.0000 | Freq: Once | ORAL | Status: DC | PRN

## 2023-10-01 MED ORDER — PROPOFOL 500 MG/50ML IV EMUL
INTRAVENOUS | Status: DC | PRN
  Administered 2023-10-01: 50 mg via INTRAVENOUS
  Administered 2023-10-01: 20 mg via INTRAVENOUS
  Administered 2023-10-01: 10 mg via INTRAVENOUS
  Administered 2023-10-01: 30 mg via INTRAVENOUS
  Administered 2023-10-01: 20 mg via INTRAVENOUS
  Administered 2023-10-01: 100 ug/kg/min via INTRAVENOUS

## 2023-10-01 MED ORDER — POVIDONE-IODINE 10 % EX SWAB
2.0000 | Freq: Once | CUTANEOUS | Status: DC
Start: 2023-10-01 — End: 2023-10-01

## 2023-10-01 MED ORDER — CHLORHEXIDINE GLUCONATE 0.12 % MT SOLN
15.0000 mL | Freq: Once | OROMUCOSAL | Status: AC
  Administered 2023-10-01: 15 mL via OROMUCOSAL

## 2023-10-01 MED ORDER — MENTHOL 3 MG MT LOZG: 1.0000 | LOZENGE | OROMUCOSAL | Status: DC | PRN

## 2023-10-01 SURGICAL SUPPLY — 40 items
BAG COUNTER SPONGE SURGICOUNT (BAG) IMPLANT
BAG ZIPLOCK 12X15 (MISCELLANEOUS) ×2 IMPLANT
COVER SURGICAL LIGHT HANDLE (MISCELLANEOUS) ×1 IMPLANT
DERMABOND ADVANCED .7 DNX12 (GAUZE/BANDAGES/DRESSINGS) IMPLANT
DRAPE INCISE IOBAN 66X45 STRL (DRAPES) ×1 IMPLANT
DRAPE POUCH INSTRU U-SHP 10X18 (DRAPES) ×1 IMPLANT
DRAPE SURG ORHT 6 SPLT 77X108 (DRAPES) ×2 IMPLANT
DRAPE U-SHAPE 47X51 STRL (DRAPES) ×1 IMPLANT
DRSG AQUACEL AG ADV 3.5X14 (GAUZE/BANDAGES/DRESSINGS) IMPLANT
DRSG MEPILEX POST OP 4X8 (GAUZE/BANDAGES/DRESSINGS) ×1 IMPLANT
DURAPREP 26ML APPLICATOR (WOUND CARE) ×1 IMPLANT
ELECT REM PT RETURN 15FT ADLT (MISCELLANEOUS) ×1 IMPLANT
FACESHIELD WRAPAROUND (MASK) ×4 IMPLANT
FACESHIELD WRAPAROUND OR TEAM (MASK) ×4 IMPLANT
GLOVE BIO SURGEON STRL SZ7 (GLOVE) IMPLANT
GLOVE BIO SURGEON STRL SZ8 (GLOVE) ×1 IMPLANT
GLOVE BIOGEL PI IND STRL 7.0 (GLOVE) IMPLANT
GLOVE BIOGEL PI IND STRL 8 (GLOVE) ×1 IMPLANT
GOWN STRL REUS W/ TWL LRG LVL3 (GOWN DISPOSABLE) ×1 IMPLANT
HEAD M SROM 36MM 2 (Hips) IMPLANT
KIT BASIN OR (CUSTOM PROCEDURE TRAY) ×1 IMPLANT
KIT TURNOVER KIT A (KITS) IMPLANT
MANIFOLD NEPTUNE II (INSTRUMENTS) ×1 IMPLANT
NDL SAFETY ECLIPSE 18X1.5 (NEEDLE) ×1 IMPLANT
NS IRRIG 1000ML POUR BTL (IV SOLUTION) ×1 IMPLANT
PACK TOTAL JOINT (CUSTOM PROCEDURE TRAY) ×1 IMPLANT
PENCIL SMOKE EVACUATOR COATED (MISCELLANEOUS) ×1 IMPLANT
PIN ESC CONST ACE LINER 36X58 (Liner) IMPLANT
PROTECTOR NERVE ULNAR (MISCELLANEOUS) ×1 IMPLANT
SET HNDPC FAN SPRY TIP SCT (DISPOSABLE) IMPLANT
SROM M HEAD 36MM 2 (Hips) ×1 IMPLANT
SUCTION TUBE FRAZIER 12FR DISP (SUCTIONS) ×1 IMPLANT
SUT ETHIBOND NAB CT1 #1 30IN (SUTURE) ×2 IMPLANT
SUT STRATAFIX 0 PDS 27 VIOLET (SUTURE) ×1 IMPLANT
SUT VIC AB 2-0 CT1 TAPERPNT 27 (SUTURE) ×3 IMPLANT
SUTURE STRATFX 0 PDS 27 VIOLET (SUTURE) ×1 IMPLANT
SWAB CULTURE ESWAB REG 1ML (MISCELLANEOUS) IMPLANT
SYR 50ML LL SCALE MARK (SYRINGE) ×1 IMPLANT
TOWEL OR 17X26 10 PK STRL BLUE (TOWEL DISPOSABLE) ×2 IMPLANT
TRAY FOLEY MTR SLVR 16FR STAT (SET/KITS/TRAYS/PACK) ×1 IMPLANT

## 2023-10-01 NOTE — Brief Op Note (Signed)
 10/01/2023  3:04 PM  PATIENT:  Jim Carson  73 y.o. male  PRE-OPERATIVE DIAGNOSIS:  Left hip failed acetabulum  POST-OPERATIVE DIAGNOSIS:  Left hip failed acetabulum  PROCEDURE:  Procedure(s) with comments: Left hip constrained liner revision (Left) -  SURGEON:  Surgeons and Role:    Ollen Gross, MD - Primary  PHYSICIAN ASSISTANT:   ASSISTANTS: annie Swanburg, PA-C   ANESTHESIA:   spinal  EBL:  150 mL   BLOOD ADMINISTERED:none  DRAINS: none   LOCAL MEDICATIONS USED:  MARCAINE     COUNTS:  YES  TOURNIQUET:  * No tourniquets in log *  DICTATION: .Other Dictation: Dictation Number [id number]  PLAN OF CARE: Admit to inpatient   PATIENT DISPOSITION:  PACU - hemodynamically stable.

## 2023-10-01 NOTE — Interval H&P Note (Signed)
 History and Physical Interval Note:  10/01/2023 11:58 AM  Jim Carson  has presented today for surgery, with the diagnosis of Left hip failed acetabulum.  The various methods of treatment have been discussed with the patient and family. After consideration of risks, benefits and other options for treatment, the patient has consented to  Procedure(s) with comments: Left hip constrained liner revision (Left) - as a surgical intervention.  The patient's history has been reviewed, patient examined, no change in status, stable for surgery.  I have reviewed the patient's chart and labs.  Questions were answered to the patient's satisfaction.     Homero Fellers Rishi Vicario

## 2023-10-01 NOTE — Discharge Instructions (Signed)
Ollen Gross, MD Total Joint Specialist [facility] [address], [address] [place], [place] [address] [phone]  POSTOPERATIVE DIRECTIONS   Hip Rehabilitation, Guidelines Following Surgery  The results of a hip operation are greatly improved after range of motion and muscle strengthening exercises. Follow all safety measures which are given to protect your hip. If any of these exercises cause increased pain or swelling in your joint, decrease the amount until you are comfortable again. Then slowly increase the exercises. Call your caregiver if you have problems or questions.   BLOOD CLOT PREVENTION Take an 81 mg Aspirin two times a day for three weeks following surgery. Then take an 81 mg Aspirin once a day for three weeks. Then discontinue Aspirin. You may resume your vitamins/supplements upon discharge from the hospital. Do not take any NSAIDs (Advil, Aleve, Ibuprofen, Meloxicam, etc.) until you are 3 weeks out from surgery  HOME CARE INSTRUCTIONS  Remove items at home which could result in a fall. This includes throw rugs or furniture in walking pathways.  ICE to the affected hip as frequently as 20-30 minutes an hour and then as needed for pain and swelling. Continue to use ice on the hip for pain and swelling from surgery. You may notice swelling that will progress down to the foot and ankle. This is normal after surgery. Elevate the leg when you are not up walking on it.   Continue to use the breathing machine which will help keep your temperature down.  It is common for your temperature to cycle up and down following surgery, especially at night when you are not up moving around and exerting yourself.  The breathing machine keeps your lungs expanded and your temperature down.  DIET You may resume your previous home diet once your are discharged from the hospital.  DRESSING / WOUND CARE / SHOWERING You have an adhesive waterproof bandage over the incision.  Leave this in place until your first follow-up appointment. Once you remove this you will not need to place another bandage.  You may begin showering 3 days following surgery, but do not submerge the incision under water.  ACTIVITY For the first 3-5 days, it is important to rest and keep the operative leg elevated. You should, as a general rule, rest for 50 minutes and walk/stretch for 10 minutes per hour. After 5 days, you may slowly increase activity as tolerated.  Perform the exercises you were provided twice a day for about 15-20 minutes each session. Begin these 2 days following surgery. Walk with your walker as instructed. Use the walker until you are comfortable transitioning to a cane. Walk with the cane in the opposite hand of the operative leg. You may discontinue the cane once you are comfortable and walking steadily. Avoid periods of inactivity such as sitting longer than an hour when not asleep. This helps prevent blood clots.  Do not drive a car for 6 weeks or until released by your surgeon.  Do not drive while taking narcotics.  TED HOSE STOCKINGS Wear the elastic stockings on both legs for three weeks following surgery during the day. You may remove them at night while sleeping.  WEIGHT BEARING Weight bearing as tolerated with assist device (walker, cane, etc) as directed, use it as long as suggested by your surgeon or therapist, typically at least 4-6 weeks.  POSTOPERATIVE CONSTIPATION PROTOCOL Constipation - defined medically as fewer than three stools per week and severe constipation as less than one stool per week.  less than one stool per week.  One of the most common issues patients have following surgery is constipation.  Even if you have a regular bowel pattern at home, your normal regimen is likely to be disrupted due to multiple reasons following surgery.  Combination of anesthesia, postoperative narcotics, change in appetite and fluid intake all can  affect your bowels.  In order to avoid complications following surgery, here are some recommendations in order to help you during your recovery period.  Colace (docusate) - Pick up an over-the-counter form of Colace or another stool softener and take twice a day as long as you are requiring postoperative pain medications.  Take with a full glass of water daily.  If you experience loose stools or diarrhea, hold the colace until you stool forms back up.  If your symptoms do not get better within 1 week or if they get worse, check with your doctor. Dulcolax (bisacodyl) - Pick up over-the-counter and take as directed by the product packaging as needed to assist with the movement of your bowels.  Take with a full glass of water.  Use this product as needed if not relieved by Colace only.  MiraLax (polyethylene glycol) - Pick up over-the-counter to have on hand.  MiraLax is a solution that will increase the amount of water in your bowels to assist with bowel movements.  Take as directed and can mix with a glass of water, juice, soda, coffee, or tea.  Take if you go more than two days without a movement.Do not use MiraLax more than once per day. Call your doctor if you are still constipated or irregular after using this medication for 7 days in a row.  If you continue to have problems with postoperative constipation, please contact the office for further assistance and recommendations.  If you experience "the worst abdominal pain ever" or develop nausea or vomiting, please contact the office immediatly for further recommendations for treatment.  ITCHING  If you experience itching with your medications, try taking only a single pain pill, or even half a pain pill at a time.  You can also use Benadryl over the counter for itching or also to help with sleep.   MEDICATIONS See your medication summary on the "After Visit Summary" that the nursing staff will review with you prior to discharge.  You may have some home  medications which will be placed on hold until you complete the course of blood thinner medication.  It is important for you to complete the blood thinner medication as prescribed by your surgeon.  Continue your approved medications as instructed at time of discharge.  PRECAUTIONS If you experience chest pain or shortness of breath - call 911 immediately for transfer to the hospital emergency department.  If you develop a fever greater that 101 F, purulent drainage from wound, increased redness or drainage from wound, foul odor from the wound/dressing, or calf pain - CONTACT YOUR SURGEON.                                                   FOLLOW-UP APPOINTMENTS Make sure you keep all of your appointments after your operation with your surgeon and caregivers. You should call the office at the above phone number and make an appointment for approximately two weeks after the date of your surgery or on the   date instructed by your surgeon outlined in the "After Visit Summary".  RANGE OF MOTION AND STRENGTHENING EXERCISES  These exercises are designed to help you keep full movement of your hip joint. Follow your caregiver's or physical therapist's instructions. Perform all exercises about fifteen times, three times per day or as directed. Exercise both hips, even if you have had only one joint replacement. These exercises can be done on a training (exercise) mat, on the floor, on a table or on a bed. Use whatever works the best and is most comfortable for you. Use music or television while you are exercising so that the exercises are a pleasant break in your day. This will make your life better with the exercises acting as a break in routine you can look forward to.  Lying on your back, slowly slide your foot toward your buttocks, raising your knee up off the floor. Then slowly slide your foot back down until your leg is straight again.  Lying on your back spread your legs as far apart as you can without causing  discomfort.  Lying on your side, raise your upper leg and foot straight up from the floor as far as is comfortable. Slowly lower the leg and repeat.  Lying on your back, tighten up the muscle in the front of your thigh (quadriceps muscles). You can do this by keeping your leg straight and trying to raise your heel off the floor. This helps strengthen the largest muscle supporting your knee.  Lying on your back, tighten up the muscles of your buttocks both with the legs straight and with the knee bent at a comfortable angle while keeping your heel on the floor.   POST-OPERATIVE OPIOID TAPER INSTRUCTIONS: It is important to wean off of your opioid medication as soon as possible. If you do not need pain medication after your surgery it is ok to stop day one. Opioids include: Codeine, Hydrocodone(Norco, Vicodin), Oxycodone(Percocet, oxycontin) and hydromorphone amongst others.  Long term and even short term use of opiods can cause: Increased pain response Dependence Constipation Depression Respiratory depression And more.  Withdrawal symptoms can include Flu like symptoms Nausea, vomiting And more Techniques to manage these symptoms Hydrate well Eat regular healthy meals Stay active Use relaxation techniques(deep breathing, meditating, yoga) Do Not substitute Alcohol to help with tapering If you have been on opioids for less than two weeks and do not have pain than it is ok to stop all together.  Plan to wean off of opioids This plan should start within one week post op of your joint replacement. Maintain the same interval or time between taking each dose and first decrease the dose.  Cut the total daily intake of opioids by one tablet each day Next start to increase the time between doses. The last dose that should be eliminated is the evening dose.   IF YOU ARE TRANSFERRED TO A SKILLED REHAB FACILITY If the patient is transferred to a skilled rehab facility following release from the  hospital, a list of the current medications will be sent to the facility for the patient to continue.  When discharged from the skilled rehab facility, please have the facility set up the patient's Home Health Physical Therapy prior to being released. Also, the skilled facility will be responsible for providing the patient with their medications at time of release from the facility to include their pain medication, the muscle relaxants, and their blood thinner medication. If the patient is still at the rehab facility   up appointment, the skilled rehab facility will also need to assist the patient in arranging follow up appointment in our office and any transportation needs.  MAKE SURE YOU:  Understand these instructions.  Get help right away if you are not doing well or get worse.    DENTAL ANTIBIOTICS:  In most cases prophylactic antibiotics for Dental procdeures after total joint surgery are not necessary.  Exceptions are as follows:  1. History of prior total joint infection  2. Severely immunocompromised (Organ Transplant, cancer chemotherapy, Rheumatoid biologic meds such as Humera)  3. Poorly controlled diabetes (A1C &gt; 8.0, blood glucose over 200)  If you have one of these conditions, contact your surgeon for an antibiotic prescription, prior to your dental procedure.    Pick up stool softner and laxative for home use following surgery while on pain medications. Do not submerge incision under water. Please use good hand washing techniques while changing dressing each day. May shower starting three days after surgery. Please use a clean towel to pat the incision dry following showers. Continue to use ice for pain and swelling after surgery. Do not use any lotions or creams on the incision until instructed by your surgeon.  

## 2023-10-01 NOTE — Anesthesia Procedure Notes (Signed)
 Procedure Name: MAC Date/Time: 10/01/2023 1:18 PM  Performed by: Maurene Capes, CRNAPre-anesthesia Checklist: Patient identified, Emergency Drugs available, Suction available, Patient being monitored and Timeout performed Patient Re-evaluated:Patient Re-evaluated prior to induction Oxygen Delivery Method: Simple face mask Preoxygenation: Pre-oxygenation with 100% oxygen Induction Type: IV induction Placement Confirmation: positive ETCO2 Dental Injury: Teeth and Oropharynx as per pre-operative assessment

## 2023-10-01 NOTE — Anesthesia Procedure Notes (Signed)
 Spinal  Patient location during procedure: OR Start time: 10/01/2023 1:28 PM End time: 10/01/2023 1:35 PM Reason for block: surgical anesthesia Staffing Performed: anesthesiologist  Anesthesiologist: Mariann Barter, MD Performed by: Mariann Barter, MD Authorized by: Mariann Barter, MD   Preanesthetic Checklist Completed: patient identified, IV checked, site marked, risks and benefits discussed, surgical consent, monitors and equipment checked, pre-op evaluation and timeout performed Spinal Block Patient position: sitting Prep: DuraPrep Patient monitoring: heart rate, cardiac monitor, continuous pulse ox and blood pressure Approach: midline Location: L3-4 Injection technique: single-shot Needle Needle type: Sprotte  Needle gauge: 24 G Needle length: 9 cm Assessment Sensory level: T4 Events: CSF return

## 2023-10-01 NOTE — Anesthesia Postprocedure Evaluation (Signed)
 Anesthesia Post Note  Patient: Jim Carson  Procedure(s) Performed: Left hip constrained liner revision (Left: Hip)     Patient location during evaluation: PACU Anesthesia Type: Spinal Level of consciousness: awake and alert Pain management: pain level controlled Vital Signs Assessment: post-procedure vital signs reviewed and stable Respiratory status: spontaneous breathing, nonlabored ventilation, respiratory function stable and patient connected to nasal cannula oxygen Cardiovascular status: blood pressure returned to baseline and stable Postop Assessment: no apparent nausea or vomiting Anesthetic complications: no   No notable events documented.  Last Vitals:  Vitals:   10/01/23 1630 10/01/23 1645  BP: (!) 118/58 138/60  Pulse: 62 64  Resp: 13 14  Temp:    SpO2: 100% 100%    Last Pain:  Vitals:   10/01/23 1645  TempSrc:   PainSc: 0-No pain                 Mariann Barter

## 2023-10-01 NOTE — Transfer of Care (Signed)
 Immediate Anesthesia Transfer of Care Note  Patient: Jim Carson  Procedure(s) Performed: Left hip constrained liner revision (Left: Hip)  Patient Location: PACU  Anesthesia Type:Spinal  Level of Consciousness: awake, alert , and oriented  Airway & Oxygen Therapy: Patient Spontanous Breathing and Patient connected to face mask oxygen  Post-op Assessment: Report given to RN and Post -op Vital signs reviewed and stable  Post vital signs: Reviewed and stable  Last Vitals:  Vitals Value Taken Time  BP 104/56 10/01/23 1539  Temp    Pulse 61 10/01/23 1543  Resp 13 10/01/23 1543  SpO2 100 % 10/01/23 1543  Vitals shown include unfiled device data.  Last Pain:  Vitals:   10/01/23 1221  TempSrc: Oral  PainSc:          Complications: No notable events documented.

## 2023-10-02 ENCOUNTER — Encounter (HOSPITAL_COMMUNITY): Payer: Self-pay | Admitting: Orthopedic Surgery

## 2023-10-02 ENCOUNTER — Other Ambulatory Visit (HOSPITAL_COMMUNITY): Payer: Self-pay

## 2023-10-02 LAB — CBC
HCT: 39.2 % (ref 39.0–52.0)
Hemoglobin: 13.1 g/dL (ref 13.0–17.0)
MCH: 30.2 pg (ref 26.0–34.0)
MCHC: 33.4 g/dL (ref 30.0–36.0)
MCV: 90.3 fL (ref 80.0–100.0)
Platelets: 153 10*3/uL (ref 150–400)
RBC: 4.34 MIL/uL (ref 4.22–5.81)
RDW: 12.8 % (ref 11.5–15.5)
WBC: 10 10*3/uL (ref 4.0–10.5)
nRBC: 0 % (ref 0.0–0.2)

## 2023-10-02 LAB — BASIC METABOLIC PANEL WITH GFR
Anion gap: 9 (ref 5–15)
BUN: 16 mg/dL (ref 8–23)
CO2: 24 mmol/L (ref 22–32)
Calcium: 9 mg/dL (ref 8.9–10.3)
Chloride: 106 mmol/L (ref 98–111)
Creatinine, Ser: 0.82 mg/dL (ref 0.61–1.24)
GFR, Estimated: >60 mL/min (ref >60)
Glucose, Bld: 124 mg/dL — ABNORMAL HIGH (ref 70–99)
Potassium: 3.8 mmol/L (ref 3.5–5.1)
Sodium: 139 mmol/L (ref 135–145)

## 2023-10-02 MED ORDER — TRAMADOL HCL 50 MG PO TABS
50.0000 mg | ORAL_TABLET | Freq: Four times a day (QID) | ORAL | 0 refills | Status: AC | PRN
Start: 2023-10-02 — End: ?
  Filled 2023-10-02: qty 40, 5d supply, fill #0

## 2023-10-02 MED ORDER — ASPIRIN 81 MG PO CHEW
81.0000 mg | CHEWABLE_TABLET | Freq: Two times a day (BID) | ORAL | 0 refills | Status: AC
  Filled 2023-10-02: qty 40, 20d supply, fill #0

## 2023-10-02 MED ORDER — METHOCARBAMOL 500 MG PO TABS
500.0000 mg | ORAL_TABLET | Freq: Four times a day (QID) | ORAL | 0 refills | Status: AC | PRN
  Filled 2023-10-02: qty 40, 10d supply, fill #0

## 2023-10-02 MED ORDER — HYDROCODONE-ACETAMINOPHEN 5-325 MG PO TABS
1.0000 | ORAL_TABLET | Freq: Four times a day (QID) | ORAL | 0 refills | Status: AC | PRN
Start: 2023-10-02 — End: ?
  Filled 2023-10-02: qty 42, 6d supply, fill #0

## 2023-10-02 MED ORDER — ONDANSETRON HCL 4 MG PO TABS
4.0000 mg | ORAL_TABLET | Freq: Four times a day (QID) | ORAL | 0 refills | Status: AC | PRN
  Filled 2023-10-02: qty 20, 5d supply, fill #0

## 2023-10-02 NOTE — Op Note (Signed)
 NAME: Jim Carson, Jim Carson MEDICAL RECORD NO: [id number] ACCOUNT NO: [id number] DATE OF BIRTH: 10-22-1950 FACILITY: [facility] LOCATION: [id number] PHYSICIAN: Gus Rankin. 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  POSTOPERATIVE DIAGNOSIS: Failed left hip acetabular liner.  PROCEDURE PERFORMED:  Revision of left hip to constrained liner.  SURGEON:  Gus Rankin. Sheva Mcdougle, MD  ASSISTANT:  Arcola Jansky, PA-C  ANESTHESIA:  Spinal.  ESTIMATED BLOOD LOSS:  150.  DRAINS:  None  COMPLICATIONS:  None  CONDITION:  Stable to recovery  BRIEF CLINICAL NOTE:  The patient is a 73 year old male with a complex history in regards to his left hip.  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  He presented to me with an unstable hip,  and I placed a constrained liner as his components were all in good position, but he was at high risk for recurrent dislocations due to Parkinson's.  He did fine for several years and had a traumatic episode where he fell earlier this year and then  dislodged the locking ring.  It is felt that this was a potential for instability, and he presents now for revision to a new constrained liner.  DESCRIPTION OF PROCEDURE:  After successful administration of spinal anesthetic, the patient was placed in the right lateral decubitus position with the left side up and held with the hip positioner.  The patient's left lower extremity was isolated from  his perineum with plastic drapes and prepped and draped in the usual sterile fashion.  Previous posterolateral incisions were utilized.  Skin cut with a 10 blade through subcutaneous tissue to the fascia lata which was incised in line with the skin  incision. A small amount of metallic debris was found in the tissues, and that was removed.  I identified the hip joint, and the locking ring had dislodged from the liner.   I was able to dislocate the hip and remove the femoral head.  This was a 36 -2  femoral head.  The hip was retracted anteriorly, and the acetabular liner removed from the acetabular shell.  The shell is well placed and well fixed.  This was a 58 mm Pinnacle acetabular shell.  We obtained excellent visualization and then placed a new  36 mm constrained liner for the 58 mm Pinnacle acetabular shell.  This was impacted and locked into position.  The 36 -2 femoral head was placed on the femoral neck, and a locking ring placed around the neck.  It was very difficult to reduce this hip,  but once we finally got it reduced, it snapped into position, and then the locking ring was impacted and found to be very stable.  We went through a stable range of motion with no impingement.  The wound was copiously irrigated with saline solution, and  then the fascia lata closed with a running 0 Stratafix suture.  Subcutaneous was closed with interrupted 2-0 Vicryl after 20 mL of 0.25% Marcaine with epinephrine were injected into the subcutaneous tissues.  Subcuticular was closed with running 4-0  Monocryl, and then the incisions cleaned and dried and Dermabond and sterile dressing applied.  He subsequently awakened and transported to the recovery room in stable condition.  Note that the surgical assistant is a medical necessity for this procedure to do it in a safe and expeditious manner. A surgical assistant is necessary for retraction of vital structures and for proper  positioning of the limb for safe removal of the old  implant and safe and accurate placement of the new implant.  This was also an extremely difficult reduction, and it was absolutely necessary to have the assistant help with the reduction.   SHW D: 10/01/2023 3:13:11 pm T: 10/02/2023 2:06:00 am  JOB: [id number]/ [id number]

## 2023-10-02 NOTE — Evaluation (Signed)
 Physical Therapy Evaluation Patient Details Name: Jim Carson MRN: [id number] DOB: 16-Mar-1951 Today's Date: 10/02/2023  History of Present Illness  Pt s/p L THR revision to replace constrained liner.  Pt with hx of Parkinsons, cervical fusion, back curgery and L THR s/p previous revision  Clinical Impression  Pt admitted as above and presenting with functional mobility limitations 2* decreased L LE strength/ROM, post op pain and Parkinson related balance deficits.  Pt should progress well to dc home with family assist.        If plan is discharge home, recommend the following: A little help with walking and/or transfers;A little help with bathing/dressing/bathroom;Assistance with cooking/housework;Help with stairs or ramp for entrance;Assist for transportation   Can travel by private vehicle        Equipment Recommendations None recommended by PT  Recommendations for Other Services       Functional Status Assessment Patient has had a recent decline in their functional status and demonstrates the ability to make significant improvements in function in a reasonable and predictable amount of time.     Precautions / Restrictions Precautions Precautions: Posterior Hip;Fall Precaution Booklet Issued: Yes (comment) Precaution/Restrictions Comments: ALL THP reviewed x 3 Restrictions Weight Bearing Restrictions Per Provider Order: No Other Position/Activity Restrictions: WBAT      Mobility  Bed Mobility Overal bed mobility: Needs Assistance Bed Mobility: Supine to Sit, Sit to Supine     Supine to sit: Contact guard Sit to supine: Supervision   General bed mobility comments: cues for use of gait belt to assist    Transfers Overall transfer level: Needs assistance Equipment used: Rolling walker (2 wheels) Transfers: Sit to/from Stand Sit to Stand: Contact guard assist           General transfer comment: cues for LE management, adherence to THP and use of UEs to self  assist    Ambulation/Gait Ambulation/Gait assistance: Min assist, Contact guard assist Gait Distance (Feet): 100 Feet (and additional 15' back to chair) Assistive device: Rolling walker (2 wheels) Gait Pattern/deviations: Step-to pattern, Step-through pattern, Decreased step length - right, Decreased step length - left, Shuffle, Narrow base of support       General Gait Details: cues for posture, position from RW, to increase BOS  Stairs            Wheelchair Mobility     Tilt Bed    Modified Rankin (Stroke Patients Only)       Balance Overall balance assessment: Needs assistance Sitting-balance support: No upper extremity supported, Feet supported Sitting balance-Leahy Scale: Good     Standing balance support: Bilateral upper extremity supported Standing balance-Leahy Scale: Poor                               Pertinent Vitals/Pain Pain Assessment Pain Assessment: 0-10 Pain Score: 4  Pain Location: L hip Pain Descriptors / Indicators: Aching, Sore Pain Intervention(s): Limited activity within patient's tolerance, Monitored during session, Premedicated before session, Ice applied    Home Living Family/patient expects to be discharged to:: Private residence Living Arrangements: Spouse/significant other Available Help at Discharge: Family Type of Home: House Home Access: Level entry       Home Layout: One level Home Equipment: Agricultural consultant (2 wheels);Rollator (4 wheels);BSC/3in1;Cane - single point      Prior Function Prior Level of Function : Independent/Modified Independent             Mobility Comments:  using rollator or upright RW       Extremity/Trunk Assessment   Upper Extremity Assessment Upper Extremity Assessment: Overall WFL for tasks assessed    Lower Extremity Assessment Lower Extremity Assessment: LLE deficits/detail LLE Deficits / Details: AAROM at hip to 90 flex and 20 abd; Strength at hip 3/5    Cervical /  Trunk Assessment Cervical / Trunk Assessment: Kyphotic  Communication   Communication Communication: No apparent difficulties    Cognition Arousal: Alert Behavior During Therapy: WFL for tasks assessed/performed   PT - Cognitive impairments: No apparent impairments                         Following commands: Intact       Cueing Cueing Techniques: Verbal cues     General Comments      Exercises Total Joint Exercises Ankle Circles/Pumps: AROM, Both, 15 reps, Supine Quad Sets: AROM, Both, 10 reps, Supine Heel Slides: AAROM, Left, 20 reps, Supine Hip ABduction/ADduction: AAROM, AROM, Left, 15 reps, Supine Long Arc Quad: AROM, Left, 10 reps, Seated   Assessment/Plan    PT Assessment Patient needs continued PT services  PT Problem List Decreased strength;Decreased activity tolerance;Decreased balance;Decreased mobility;Decreased knowledge of use of DME;Pain       PT Treatment Interventions DME instruction;Gait training;Stair training;Functional mobility training;Therapeutic activities;Therapeutic exercise;Balance training;Patient/family education    PT Goals (Current goals can be found in the Care Plan section)  Acute Rehab PT Goals Patient Stated Goal: Regain IND PT Goal Formulation: With patient Time For Goal Achievement: 10/09/23 Potential to Achieve Goals: Good    Frequency 7X/week     Co-evaluation               AM-PAC PT "6 Clicks" Mobility  Outcome Measure Help needed turning from your back to your side while in a flat bed without using bedrails?: A Little Help needed moving from lying on your back to sitting on the side of a flat bed without using bedrails?: A Little Help needed moving to and from a bed to a chair (including a wheelchair)?: A Little Help needed standing up from a chair using your arms (e.g., wheelchair or bedside chair)?: A Little Help needed to walk in hospital room?: A Little Help needed climbing 3-5 steps with a railing?  : A Little 6 Click Score: 18    End of Session Equipment Utilized During Treatment: Gait belt Activity Tolerance: Patient tolerated treatment well Patient left: in chair;with call bell/phone within reach;with chair alarm set;with family/visitor present Nurse Communication: Mobility status PT Visit Diagnosis: Unsteadiness on feet (R26.81);Difficulty in walking, not elsewhere classified (R26.2)    Time: 1610-9604 PT Time Calculation (min) (ACUTE ONLY): 39 min   Charges:   PT Evaluation $PT Eval Low Complexity: 1 Low PT Treatments $Gait Training: 8-22 mins $Therapeutic Exercise: 8-22 mins PT General Charges $$ ACUTE PT VISIT: 1 Visit         Mauro Kaufmann PT Acute Rehabilitation Services Pager [phone] Office [phone]   Ziad Maye 10/02/2023, 10:55 AM

## 2023-10-02 NOTE — Progress Notes (Addendum)
 The patient is stable, alert, oriented x4 and ambulatory with walker. No change from am assessment. Incision site is clean, dry and intact. Discharge instructions were reviewed with the patient and his spouse. He denied questions, or concerns at this time.

## 2023-10-02 NOTE — Progress Notes (Signed)
 Physical Therapy Treatment Patient Details Name: Jim Carson MRN: [id number] DOB: 18-Jan-1951 Today's Date: 10/02/2023   History of Present Illness Pt s/p L THR revision to replace constrained liner.  Pt with hx of Parkinsons, cervical fusion, back curgery and L THR s/p previous revision    PT Comments  Pt progressing well with mobility including up to ambulate in hall, negotiated stairs, reviewed HEP, reviewed THR and reviewed car transfers.  Pt eager for dc home this date.   If plan is discharge home, recommend the following: A little help with walking and/or transfers;A little help with bathing/dressing/bathroom;Assistance with cooking/housework;Help with stairs or ramp for entrance;Assist for transportation   Can travel by private vehicle        Equipment Recommendations  None recommended by PT    Recommendations for Other Services       Precautions / Restrictions Precautions Precautions: Posterior Hip;Fall Precaution Booklet Issued: Yes (comment) Precaution/Restrictions Comments: ALL THP reviewed Restrictions Weight Bearing Restrictions Per Provider Order: No Other Position/Activity Restrictions: WBAT     Mobility  Bed Mobility Overal bed mobility: Needs Assistance Bed Mobility: Supine to Sit, Sit to Supine     Supine to sit: Contact guard Sit to supine: Supervision   General bed mobility comments: Up in chair and returns to same    Transfers Overall transfer level: Needs assistance Equipment used: Rolling walker (2 wheels) Transfers: Sit to/from Stand Sit to Stand: Contact guard assist, Supervision           General transfer comment: cues for LE management, adherence to THP and use of UEs to self assist    Ambulation/Gait Ambulation/Gait assistance: Contact guard assist, Supervision Gait Distance (Feet): 64 Feet Assistive device: Rolling walker (2 wheels) Gait Pattern/deviations: Step-to pattern, Step-through pattern, Decreased step length - right,  Decreased step length - left, Shuffle, Narrow base of support Gait velocity: decr     General Gait Details: cues for posture, position from RW, to increase BOS   Stairs Stairs: Yes Stairs assistance: Min assist Stair Management: Step to pattern, Two rails, Forwards Number of Stairs: 5 General stair comments: 2+5 stairs; cues for sequence   Wheelchair Mobility     Tilt Bed    Modified Rankin (Stroke Patients Only)       Balance Overall balance assessment: Needs assistance Sitting-balance support: No upper extremity supported, Feet supported Sitting balance-Leahy Scale: Good     Standing balance support: Bilateral upper extremity supported Standing balance-Leahy Scale: Poor                              Communication Communication Communication: No apparent difficulties  Cognition Arousal: Alert Behavior During Therapy: WFL for tasks assessed/performed   PT - Cognitive impairments: No apparent impairments                         Following commands: Intact      Cueing Cueing Techniques: Verbal cues  Exercises Total Joint Exercises Ankle Circles/Pumps: AROM, Both, 15 reps, Supine Quad Sets: AROM, Both, 10 reps, Supine Heel Slides: AAROM, Left, 20 reps, Supine Hip ABduction/ADduction: AAROM, AROM, Left, 15 reps, Supine Long Arc Quad: AROM, Left, 10 reps, Seated    General Comments        Pertinent Vitals/Pain Pain Assessment Pain Assessment: 0-10 Pain Score: 2  Pain Location: L hip Pain Descriptors / Indicators: Aching, Sore Pain Intervention(s): Limited activity within patient's tolerance, Monitored during  session, Premedicated before session, Ice applied    Home Living Family/patient expects to be discharged to:: Private residence Living Arrangements: Spouse/significant other Available Help at Discharge: Family Type of Home: House Home Access: Level entry       Home Layout: One level Home Equipment: Agricultural consultant (2  wheels);Rollator (4 wheels);BSC/3in1;Cane - single point      Prior Function            PT Goals (current goals can now be found in the care plan section) Acute Rehab PT Goals Patient Stated Goal: Regain IND PT Goal Formulation: With patient Time For Goal Achievement: 10/09/23 Potential to Achieve Goals: Good Progress towards PT goals: Progressing toward goals    Frequency    7X/week      PT Plan      Co-evaluation              AM-PAC PT "6 Clicks" Mobility   Outcome Measure  Help needed turning from your back to your side while in a flat bed without using bedrails?: A Little Help needed moving from lying on your back to sitting on the side of a flat bed without using bedrails?: A Little Help needed moving to and from a bed to a chair (including a wheelchair)?: A Little Help needed standing up from a chair using your arms (e.g., wheelchair or bedside chair)?: A Little Help needed to walk in hospital room?: A Little Help needed climbing 3-5 steps with a railing? : A Little 6 Click Score: 18    End of Session Equipment Utilized During Treatment: Gait belt Activity Tolerance: Patient tolerated treatment well Patient left: in chair;with call bell/phone within reach;with chair alarm set;with family/visitor present Nurse Communication: Mobility status PT Visit Diagnosis: Unsteadiness on feet (R26.81);Difficulty in walking, not elsewhere classified (R26.2)     Time: 4098-1191 PT Time Calculation (min) (ACUTE ONLY): 24 min  Charges:    $Gait Training: 8-22 mins $Therapeutic Exercise: 8-22 mins $Therapeutic Activity: 8-22 mins PT General Charges $$ ACUTE PT VISIT: 1 Visit                     Mauro Kaufmann PT Acute Rehabilitation Services Pager [phone] Office [phone]    Brayln Duque 10/02/2023, 12:50 PM

## 2023-10-02 NOTE — Progress Notes (Signed)
   Subjective: 1 Day Post-Op Procedure(s) (LRB): Left hip constrained liner revision (Left) Patient seen in rounds by Dr. Lequita Halt. Patient is well, and has had no acute complaints or problems. Denies SOB or chest pain. Denies calf pain. Foley cath removed this AM. Reports pain as moderate. We will start physical therapy today.   Objective: Vital signs in last 24 hours: Temp:  [97.6 F (36.4 C)-98.8 F (37.1 C)] 97.9 F (36.6 C) (04/03 0155) Pulse Rate:  [59-75] 64 (04/03 0155) Resp:  [13-18] 18 (04/03 0155) BP: (104-138)/(52-72) 130/56 (04/03 0155) SpO2:  [90 %-100 %] 98 % (04/03 0155) Weight:  [87 kg] 87 kg (04/02 1143)  Intake/Output from previous day:  Intake/Output Summary (Last 24 hours) at 10/02/2023 0755 Last data filed at 10/02/2023 0600 Gross per 24 hour  Intake 3022.5 ml  Output 2600 ml  Net 422.5 ml     Intake/Output this shift: No intake/output data recorded.  Labs: Recent Labs    10/02/23 0323  HGB 13.1   Recent Labs    10/02/23 0323  WBC 10.0  RBC 4.34  HCT 39.2  PLT 153   Recent Labs    10/02/23 0323  NA 139  K 3.8  CL 106  CO2 24  BUN 16  CREATININE 0.82  GLUCOSE 124*  CALCIUM 9.0   No results for input(s): "LABPT", "INR" in the last 72 hours.  Exam: General - Patient is Alert and Oriented Extremity - Neurologically intact Neurovascular intact Sensation intact distally Dorsiflexion/Plantar flexion intact Dressing - dressing C/D/I Motor Function - intact, moving foot and toes well on exam.  Past Medical History:  Diagnosis Date   Abnormal gait    due to parkinson's,  uses cane   Absolute anemia 03/13/2023   Allergic rhinitis    Allergy [place] 1998   Allergic to dustmits, dog hair and roaches.   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 Lorin Picket  @[facility]   Pneumonia    Sleep apnea    Vitamin B12 deficiency    Vitamin D deficiency    Wears glasses     Assessment/Plan: 1 Day Post-Op Procedure(s) (LRB): Left hip constrained liner revision (Left) Principal Problem:   Failed total hip arthroplasty (HCC)  Estimated body mass index is 29.16 kg/m as calculated from the following:   Height as of this encounter: 5\' 8"  (1.727 m).   Weight as of this encounter: 87 kg. Advance diet Up with therapy D/C IV fluids  DVT Prophylaxis - Aspirin Weight bearing as tolerated Posterior hip precautions  Start physical therapy. Expected discharge home today pending progress and if meeting patient goals. Will do HEP once discharged. F/u in clinic in 2 weeks.  The PDMP database was reviewed today prior to any opioid medications being prescribed to this patient.  R. Arcola Jansky, PA-C Orthopedic Surgery 10/02/2023, 7:55 AM

## 2023-10-02 NOTE — TOC Transition Note (Signed)
 Transition of Care [facility]) - Discharge Note   Patient Details  Name: Jim Carson MRN: [id number] Date of Birth: 01/08/1951  Transition of [facility] CM/SW Contact:  Howell Rucks, RN Phone Number: 10/02/2023, 9:51 AM   Clinical Narrative:  Met with pt at bedside to review dc therapy and home equipment needs, pt confirmed HEP, has RW, no home DME needs. No TOC needs.       Final next level of care: Home/Self Care Barriers to Discharge: No Barriers Identified   Patient Goals and CMS Choice Patient states their goals for this hospitalization and ongoing recovery are:: return home          Discharge Placement                       Discharge Plan and Services Additional resources added to the After Visit Summary for                                       Social Drivers of Health (SDOH) Interventions SDOH Screenings   Food Insecurity: No Food Insecurity (10/01/2023)  Housing: Low Risk  (10/01/2023)  Transportation Needs: No Transportation Needs (10/01/2023)  Utilities: Not At Risk (10/01/2023)  Alcohol Screen: Low Risk  (03/07/2023)  Depression (PHQ2-9): Low Risk  (02/17/2023)  Financial Resource Strain: Low Risk  (03/07/2023)  Physical Activity: Sufficiently Active (03/07/2023)  Social Connections: Socially Integrated (10/01/2023)  Stress: No Stress Concern Present (03/07/2023)  Tobacco Use: Low Risk  (10/01/2023)  Health Literacy: Adequate Health Literacy (02/17/2023)     Readmission Risk Interventions    10/02/2023    9:50 AM  Readmission Risk Prevention Plan  Post Dischage Appt Complete  Medication Screening Complete  Transportation Screening Complete

## 2023-10-02 NOTE — Discharge Summary (Signed)
 Physician Discharge Summary   Patient ID: Jim Carson MRN: [id number] DOB/AGE: 1951/02/20 73 y.o.  Admit date: 10/01/2023 Discharge date: 10/02/2023  Primary Diagnosis: Failed left hip acetabular liner   Admission Diagnoses:  Past Medical History:  Diagnosis Date   Abnormal gait    due to parkinson's,  uses cane   Absolute anemia 03/13/2023   Allergic rhinitis    Allergy Spring 1998   Allergic to dustmits, dog hair and roaches.   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 Lorin Picket  @[facility]   Pneumonia    Sleep apnea    Vitamin B12 deficiency    Vitamin D deficiency    Wears glasses    Discharge Diagnoses:   Principal Problem:   Failed total hip arthroplasty (HCC)  Estimated body mass index is 29.16 kg/m as calculated from the following:   Height as of this encounter: 5\' 8"  (1.727 m).   Weight as of this encounter: 87 kg.  Procedure:  Procedure(s) (LRB): Left hip constrained liner revision (Left)   Consults: None  HPI: The patient is a 73 year old male with a complex history in regards to his left hip.  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  He presented to me with an unstable hip, and I placed a constrained liner as his components were all in good position, but he was at high risk for recurrent dislocations due to Parkinson's.  He did fine for several years and had a traumatic episode where he fell earlier this year and  then dislodged the locking ring.  It is felt that this was a potential for instability, and he presents now for revision to a new constrained liner.  Laboratory Data: Admission on 10/01/2023, Discharged on 10/02/2023  Component Date Value Ref Range Status   ABO/RH(D) 10/01/2023    Final                   Value:O POS Performed at [facility], [address] [place], [place] [address]    WBC 10/02/2023 10.0  4.0 - 10.5 K/uL Final   RBC 10/02/2023 4.34  4.22 - 5.81 MIL/uL Final   Hemoglobin 10/02/2023 13.1  13.0 - 17.0 g/dL Final   HCT 19/14/7829 39.2  39.0 - 52.0 % Final   MCV 10/02/2023 90.3  80.0 - 100.0 fL Final   MCH 10/02/2023 30.2  26.0 - 34.0 pg Final   MCHC 10/02/2023 33.4  30.0 - 36.0 g/dL Final   RDW 56/21/3086 12.8  11.5 - 15.5 % Final   Platelets 10/02/2023 153  150 - 400 K/uL Final   nRBC 10/02/2023 0.0  0.0 - 0.2 % Final   Performed at [facility], [address], [place], [place] [address]   Sodium 10/02/2023 139  135 - 145 mmol/L Final   Potassium 10/02/2023 3.8  3.5 - 5.1 mmol/L Final   Chloride 10/02/2023 106  98 - 111 mmol/L Final   CO2 10/02/2023 24  22 - 32 mmol/L Final   Glucose, Bld 10/02/2023 124 (H)  70 - 99 mg/dL Final   Glucose reference range applies only to samples taken after fasting for at least 8 hours.   BUN 10/02/2023 16  8 - 23 mg/dL Final   Creatinine, Ser 10/02/2023 0.82  0.61 - 1.24 mg/dL Final   Calcium 16/04/9603 9.0  8.9 - 10.3 mg/dL Final   GFR, Estimated 10/02/2023 >60  >60 mL/min Final   Comment: (NOTE) Calculated using the CKD-EPI Creatinine Equation (2021)    Anion gap 10/02/2023 9  5 - 15 Final   Performed at [facility], [address], [place], [place] [address]  Hospital Outpatient Visit on 09/24/2023  Component Date Value Ref Range Status   MRSA, PCR 09/24/2023 NEGATIVE  NEGATIVE Final   Staphylococcus aureus 09/24/2023 NEGATIVE  NEGATIVE Final   Comment: (NOTE) The  Xpert SA Assay (FDA approved for NASAL specimens in patients 24 years of age and older), is one component of a comprehensive surveillance program. It is not intended to diagnose infection nor to guide or monitor treatment. Performed at [facility], [address] [place], [place] [address]    Sodium 09/24/2023 138  135 - 145 mmol/L Final   Potassium 09/24/2023 3.8  3.5 - 5.1 mmol/L Final   Chloride 09/24/2023 104  98 - 111 mmol/L Final   CO2 09/24/2023 25  22 - 32 mmol/L Final   Glucose, Bld 09/24/2023 105 (H)  70 - 99 mg/dL Final   Glucose reference range applies only to samples taken after fasting for at least 8 hours.   BUN 09/24/2023 17  8 - 23 mg/dL Final   Creatinine, Ser 09/24/2023 0.85  0.61 - 1.24 mg/dL Final   Calcium 78/29/5621 9.4  8.9 - 10.3 mg/dL Final   GFR, Estimated 09/24/2023 >60  >60 mL/min Final   Comment: (NOTE) Calculated using the CKD-EPI Creatinine Equation (2021)    Anion gap 09/24/2023 9  5 - 15 Final   Performed at [facility], [address], [place], [place] [address]   WBC 09/24/2023 6.6  4.0 - 10.5 K/uL Final   RBC 09/24/2023 4.78  4.22 - 5.81 MIL/uL Final   Hemoglobin 09/24/2023 14.5  13.0 - 17.0 g/dL Final   HCT 78/46/9629 43.6  39.0 - 52.0 % Final   MCV 09/24/2023 91.2  80.0 - 100.0 fL Final   MCH 09/24/2023 30.3  26.0 - 34.0 pg Final   MCHC 09/24/2023 33.3  30.0 - 36.0 g/dL Final   RDW 52/84/1324 13.0  11.5 - 15.5 % Final   Platelets 09/24/2023 153  150 - 400 K/uL Final   nRBC 09/24/2023 0.0  0.0 - 0.2 % Final   Performed at [facility], [address] [place], [place] [address]   ABO/RH(D) 09/24/2023 O POS   Final   Antibody Screen 09/24/2023 NEG   Final   Sample Expiration 09/24/2023 10/04/2023,2359   Final   Extend sample reason 09/24/2023    Final                   Value:NO TRANSFUSIONS OR PREGNANCY IN THE PAST 3 MONTHS Performed at [facility], [address], [place], [place] [address]      X-Rays:DG Pelvis Portable Result Date: 10/01/2023 CLINICAL DATA:  Status post hip arthroplasty. EXAM: PORTABLE PELVIS 1-2 VIEWS COMPARISON:  None Available. FINDINGS: Left hip arthroplasty in expected alignment. No periprosthetic lucency or fracture. Recent postsurgical change includes air and edema in the soft tissues. IMPRESSION: Left hip arthroplasty without immediate postoperative complication. Electronically Signed   By: Narda Rutherford M.D.   On: 10/01/2023 18:15    EKG: Orders placed or performed during the hospital encounter of 09/24/23   EKG 12-Lead   EKG 12-Lead     Hospital Course: FLEMON KELTY is a 73 y.o. who was admitted to [facility]. They were brought to the operating room on 10/01/2023 and underwent Procedure(s): Left hip constrained liner revision.  Patient tolerated the procedure well and was later transferred to the recovery room and then to the orthopaedic floor for postoperative care. They were given PO and IV analgesics for pain control following their surgery. They were given 24 hours of postoperative antibiotics of  Anti-infectives (From admission, onward)    Start     Dose/Rate Route Frequency Ordered Stop   10/01/23 2000  ceFAZolin (ANCEF) IVPB 2g/100 mL premix        2 g 200 mL/hr over 30 Minutes Intravenous Every 6 hours 10/01/23 1729 10/02/23 0230   10/01/23 1130  ceFAZolin (ANCEF) IVPB 2g/100 mL premix        2 g 200 mL/hr over 30 Minutes Intravenous On call to O.R. 10/01/23 1129 10/01/23 1350     and started on DVT prophylaxis in the form of Aspirin.   PT and OT were ordered for total joint protocol. Discharge planning consulted to help with postop disposition and equipment needs.  Patient had a good night on the evening of surgery. He was seen in rounds on POD #1 and was ready to go home pending progress with physical therapy. He worked with physical therapy and was meeting goals. Pt was discharged to home  later that day in stable condition.  Diet: Regular diet Activity: WBAT Follow-up: in 2 weeks Disposition: Home Discharged Condition: stable   Discharge Instructions     Call MD / Call 911   Complete by: As directed    If you experience chest pain or shortness of breath, CALL 911 and be transported to the hospital emergency room.  If you develope a fever above 101 F, pus (white drainage) or increased drainage or redness at the wound, or calf pain, call your surgeon's office.   Change dressing   Complete by: As directed    You have an adhesive waterproof bandage over the incision. Leave this in place until your first follow-up appointment. Once you remove this you will not need to place another bandage.   Constipation Prevention   Complete by: As directed    Drink plenty of fluids.  Prune juice may be helpful.  You may use a stool softener, such as Colace (over the counter) 100 mg twice a day.  Use MiraLax (over the counter) for constipation as needed.   Diet - low sodium heart healthy   Complete by: As directed    Do not sit on low chairs, stoools or toilet seats, as it may be difficult to get up from low surfaces   Complete by: As directed    Driving restrictions   Complete by: As directed    No driving for two weeks   Follow the hip precautions as taught in Physical Therapy   Complete by: As directed    Post-operative opioid taper instructions:  Complete by: As directed    POST-OPERATIVE OPIOID TAPER INSTRUCTIONS: It is important to wean off of your opioid medication as soon as possible. If you do not need pain medication after your surgery it is ok to stop day one. Opioids include: Codeine, Hydrocodone(Norco, Vicodin), Oxycodone(Percocet, oxycontin) and hydromorphone amongst others.  Long term and even short term use of opiods can cause: Increased pain response Dependence Constipation Depression Respiratory depression And more.  Withdrawal symptoms can include Flu like  symptoms Nausea, vomiting And more Techniques to manage these symptoms Hydrate well Eat regular healthy meals Stay active Use relaxation techniques(deep breathing, meditating, yoga) Do Not substitute Alcohol to help with tapering If you have been on opioids for less than two weeks and do not have pain than it is ok to stop all together.  Plan to wean off of opioids This plan should start within one week post op of your joint replacement. Maintain the same interval or time between taking each dose and first decrease the dose.  Cut the total daily intake of opioids by one tablet each day Next start to increase the time between doses. The last dose that should be eliminated is the evening dose.      TED hose   Complete by: As directed    Use stockings (TED hose) for three weeks on both leg(s).  You may remove them at night for sleeping.   Weight bearing as tolerated   Complete by: As directed       Allergies as of 10/02/2023       Reactions   Other Itching, Rash   Dog hair, roaches, dust mites: very slight reaction: causes runny nose        Medication List     TAKE these medications    Amantadine HCl 100 MG tablet Take 100 mg by mouth 2 (two) times daily.   Aspirin Low Dose 81 MG chewable tablet Generic drug: aspirin Chew 1 tablet (81 mg total) by mouth 2 (two) times daily for 20 days. Then take one 81 mg aspirin once a day for three weeks. Then discontinue aspirin.   carbidopa-levodopa 25-100 MG tablet Commonly known as: SINEMET IR Take 3.5 tablets by mouth 3 (three) times daily.   Carbidopa-Levodopa ER 25-100 MG tablet controlled release Commonly known as: SINEMET CR Take 1 tablet by mouth at bedtime.   HYDROcodone-acetaminophen 5-325 MG tablet Commonly known as: NORCO/VICODIN Take 1-2 tablets by mouth every 6 (six) hours as needed for severe pain (pain score 7-10).   ipratropium 0.06 % nasal spray Commonly known as: ATROVENT Place 1 spray into both nostrils  See admin instructions. Use 1 spray in each nostril every morning, may use a second time as needed for allergies   levothyroxine 75 MCG tablet Commonly known as: SYNTHROID Take 75 mcg by mouth daily before breakfast.   linaclotide 290 MCG Caps capsule Commonly known as: LINZESS Take 290 mcg by mouth daily as needed (constipation).   losartan-hydrochlorothiazide 100-25 MG tablet Commonly known as: HYZAAR Take 1 tablet by mouth daily.   methocarbamol 500 MG tablet Commonly known as: ROBAXIN Take 1 tablet (500 mg total) by mouth every 6 (six) hours as needed for muscle spasms.   MIRALAX PO Take 17 g by mouth daily.   ondansetron 4 MG tablet Commonly known as: ZOFRAN Take 1 tablet (4 mg total) by mouth every 6 (six) hours as needed for nausea.   pramipexole 0.5 MG tablet Commonly known as: MIRAPEX Take 0.5 mg by  mouth 2 (two) times daily.   traMADol 50 MG tablet Commonly known as: ULTRAM Take 1-2 tablets (50-100 mg total) by mouth every 6 (six) hours as needed for moderate pain (pain score 4-6).   vitamin B-12 500 MCG tablet Commonly known as: CYANOCOBALAMIN Take 500 mcg by mouth daily.   Vitamin D3 125 MCG (5000 UT) Caps Take 5,000 Units by mouth in the morning.               Discharge Care Instructions  (From admission, onward)           Start     Ordered   10/02/23 0000  Weight bearing as tolerated        10/02/23 0755   10/02/23 0000  Change dressing       Comments: You have an adhesive waterproof bandage over the incision. Leave this in place until your first follow-up appointment. Once you remove this you will not need to place another bandage.   10/02/23 0755            Follow-up Information     Ollen Gross, MD. Schedule an appointment as soon as possible for a visit in 2 week(s).   Specialty: Orthopedic Surgery Contact information: [address] [place] [address] [phone]                 Signed: R.  Arcola Jansky, PA-C Orthopedic Surgery 10/02/2023, 2:33 PM

## 2023-10-02 NOTE — Care Management Important Message (Signed)
 Important Message  Patient Details IM Letter given. Name: Jim Carson MRN: [id number] Date of Birth: May 19, 1951   Important Message Given:  Yes - Medicare IM     Caren Macadam 10/02/2023, 11:01 AM

## 2023-11-05 ENCOUNTER — Ambulatory Visit: Attending: Student | Admitting: Physical Therapy

## 2023-11-05 ENCOUNTER — Other Ambulatory Visit: Payer: Self-pay

## 2023-11-05 DIAGNOSIS — M6281 Muscle weakness (generalized): Secondary | ICD-10-CM | POA: Insufficient documentation

## 2023-11-05 DIAGNOSIS — R293 Abnormal posture: Secondary | ICD-10-CM | POA: Diagnosis present

## 2023-11-05 DIAGNOSIS — R2689 Other abnormalities of gait and mobility: Secondary | ICD-10-CM | POA: Diagnosis present

## 2023-11-05 DIAGNOSIS — R29818 Other symptoms and signs involving the nervous system: Secondary | ICD-10-CM | POA: Diagnosis present

## 2023-11-05 DIAGNOSIS — R2681 Unsteadiness on feet: Secondary | ICD-10-CM | POA: Insufficient documentation

## 2023-11-05 NOTE — Therapy (Signed)
 OUTPATIENT PHYSICAL THERAPY LOWER EXTREMITY EVALUATION   Patient Name: Jim Carson MRN: [id number] DOB:29-Oct-1950, 73 y.o., male Today's Date: 11/05/2023  END OF SESSION:  PT End of Session - 11/05/23 1021     Visit Number 1    PT Start Time 1017    PT Stop Time 1100    PT Time Calculation (min) 43 min             Past Medical History:  Diagnosis Date   Abnormal gait    due to parkinson's,  uses cane   Absolute anemia 03/13/2023   Allergic rhinitis    Allergy Spring 1998   Allergic to dustmits, dog hair and roaches.   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 Geralyn Knee  @[facility]  Neurology   Pneumonia    Sleep apnea    Vitamin B12 deficiency    Vitamin D  deficiency    Wears glasses    Past Surgical History:  Procedure Laterality Date   APPENDECTOMY  07/1964   APPENDECTOMY     CHOLECYSTECTOMY     JOINT REPLACEMENT  March 2019 and again on 09/10/2021   Left hip twice.  2nd time due to 3 dislocations   LAPAROSCOPIC CHOLECYSTECTOMY  01-21-2017   @[facility] , [place]   lower back surgery      POSTERIOR CERVICAL FUSION/FORAMINOTOMY     POSTERIOR FUSION CERVICAL SPINE  08-15-2011   @[facility] , [place]   w/ laminectomy and decompression-- C3 -- T1   POSTERIOR LAMINECTOMY / DECOMPRESSION LUMBAR SPINE  07-30-2013   @[facility] , [place]   bilateral T11 - 12,  L2 --5   REMOVAL LEFT T1 PARASPINOUS MASS  02-12-2016   @[facility] ,  [place]   desmoid fibromatosis   SHOULDER ARTHROSCOPY WITH ROTATOR CUFF REPAIR Left  12/15/2018   Procedure: SHOULDER ARTHROSCOPY, biceps tenodesis labored Debridement, submacromial decompression;  Surgeon: Genevie Kerns, MD;  Location: [facility];  Service: Orthopedics;  Laterality: Left;  interscalene block   SPINE SURGERY     TOTAL HIP ARTHROPLASTY Left 09/18/2017   @[facility] , [place]   TOTAL HIP REVISION Left 09/10/2021   Procedure: Left hip acetabular versus total hip arthroplasty revision, constrained liner;  Surgeon: Liliane Rei, MD;  Location: [facility];  Service: Orthopedics;  Laterality: Left;   TOTAL HIP REVISION Left 10/01/2023   Procedure: Left hip constrained liner revision;  Surgeon: Liliane Rei, MD;  Location: [facility];  Service: Orthopedics;  Laterality: Left;    Patient Active Problem List   Diagnosis Date Noted   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   Thrombocytopenia (HCC) 03/17/2023   Absolute anemia 03/13/2023   Family history of prostate cancer 03/13/2023   History of elevated PSA 03/13/2023   AV block, 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   Drug induced constipation 12/30/2022   Acquired hypothyroidism 12/30/2022   Primary osteoarthritis of left hip 05/18/2021  PD (Parkinson's disease) (HCC) 05/18/2021   History of gastroesophageal reflux (GERD) 05/18/2021   OSA (obstructive sleep apnea) 05/18/2021   Essential hypertension 05/18/2021    PCP: Louis Row, MD  REFERRING PROVIDER: Perla Bradford, PA  REFERRING DIAG: [phone]: Encounter for other orthopedic aftercare S/P left hip constrained liner revision  THERAPY DIAG:  Muscle weakness (generalized)  Unsteadiness on feet  Other abnormalities of gait and mobility  Abnormal posture  Other symptoms and signs involving the nervous system  Rationale for Evaluation and Treatment: Rehabilitation  ONSET DATE: 10/01/23 L hip revision  SUBJECTIVE:   SUBJECTIVE STATEMENT: Pt reports he had a L hip replacement in  2019 and dislocated it 3 times. Redid it 2023, had a fall and jammed it and had to revise it 10/01/23. Was told not to go too heavy over 110 lbs on the leg press. Was told not to cross his legs. Will be seeing ortho 11/11/23 and will further clarify precautions. Pt feels his greatest issue is strength. Has not yet returned to the gym. Has been walking some but wants to walk more. Plans to do water  aerobics. Uses his 4 wheel walker at baseline.  PERTINENT HISTORY: Parkinson's (Last saw PT for this in July 2024), back problems (surgery in neck and low back), R ankle rolls  PAIN:  Are you having pain? No  PRECAUTIONS: Fall  RED FLAGS: None   WEIGHT BEARING RESTRICTIONS: No  FALLS:  Has patient fallen in last 6 months? Yes. Number of falls 3 -- last fall yesterday getting pizza out of the stove  LIVING ENVIRONMENT: Lives with: lives with their spouse and daughter Lives in: House/apartment Stairs: Yes: Internal: 14 steps; can reach both Has following equipment at home: Otho Blitz - 4 wheeled  OCCUPATION: Retired -- reads, tries to exercise, watches TV  PLOF: Independent  PATIENT GOALS: Return to exercise and walking  NEXT MD VISIT: n/a  OBJECTIVE:  Note: Objective measures were completed at Evaluation unless otherwise noted.  DIAGNOSTIC FINDINGS: x-ray on R foot and ankle 03/25/22: Ankle joint space maintained. Elevated first ray. Midfoot arthritic changes present.   PATIENT SURVEYS:  LEFS: 37/80 = 46.25%  COGNITION: Overall cognitive status: Within functional limits for tasks assessed     SENSATION: WFL  EDEMA:  None now  MUSCLE LENGTH: Hamstrings: Right ~80 deg; Left ~70 deg Thomas test: Right 0 deg; Left 0 deg  POSTURE: rounded shoulders, forward head, increased thoracic kyphosis, and flexed trunk , R knee hyperextends and comes into varus, L knee goes into valgus  PALPATION: No overt tenderness to palpation  LOWER EXTREMITY ROM:  Active ROM Right eval Left eval   Hip flexion    Hip extension    Hip abduction    Hip adduction    Hip internal rotation    Hip external rotation    Knee flexion    Knee extension    Ankle dorsiflexion    Ankle plantarflexion    Ankle inversion    Ankle eversion     (Blank rows = not tested)  LOWER EXTREMITY MMT:  MMT Right eval Left eval  Hip flexion 5 5  Hip extension 3+ 3+  Hip abduction 3 3  Hip adduction    Hip internal rotation    Hip external rotation    Knee flexion 5 4  Knee extension 5 5  Ankle dorsiflexion    Ankle plantarflexion    Ankle inversion    Ankle eversion     (Blank rows =  not tested)  LOWER EXTREMITY SPECIAL TESTS:  Did not assess  FUNCTIONAL TESTS:  5 times sit to stand: 15.1 sec without UEs Timed up and go (TUG): 13.77 sec Berg Balance Scale: 39/56  [facility] PT Assessment - 11/05/23 0001       Standardized Balance Assessment   Standardized Balance Assessment Berg Balance Test;Dynamic Gait Index      Berg Balance Test   Sit to Stand Able to stand without using hands and stabilize independently    Standing Unsupported Able to stand safely 2 minutes    Sitting with Back Unsupported but Feet Supported on Floor or Stool Able to sit safely and securely 2 minutes    Stand to Sit Sits safely with minimal use of hands    Transfers Able to transfer safely, definite need of hands    Standing Unsupported with Eyes Closed Able to stand 10 seconds safely    Standing Unsupported with Feet Together Able to place feet together independently and stand for 1 minute with supervision    From Standing, Reach Forward with Outstretched Arm Can reach forward >12 cm safely (5")    From Standing Position, Pick up Object from Floor Able to pick up shoe, needs supervision    From Standing Position, Turn to Look Behind Over each Shoulder Turn sideways only but maintains balance    Turn 360 Degrees Needs close supervision or verbal cueing   R knee hyperextends   Standing Unsupported, Alternately  Place Feet on Step/Stool Able to complete >2 steps/needs minimal assist   R knee hyperextends and pushes pt backwards   Standing Unsupported, One Foot in Front Able to take small step independently and hold 30 seconds    Standing on One Leg Tries to lift leg/unable to hold 3 seconds but remains standing independently    Total Score 39              GAIT: Distance walked: Into clinic Assistive device utilized: Walker - 4 wheeled Level of assistance: SBA Comments: R knee hyperextension during stance, increased forward lean, scissors with R LE                                                                                                                                TREATMENT DATE: 11/05/23  See HEP below   PATIENT EDUCATION:  Education details: Exam findings, POC, initial HEP Person educated: Patient Education method: Explanation, Demonstration, and Handouts Education comprehension: verbalized understanding, returned demonstration, and needs further education  HOME EXERCISE PROGRAM: Access Code: [email or web address] URL: [email or web address] Date: 11/05/2023 Prepared by: Townsend Cudworth April Erman Hayward  Exercises - Seated Hip Abduction with Resistance  - 1 x daily - 7 x weekly - 2 sets - 10 reps - Seated March with Resistance  - 1 x daily - 7 x weekly - 2 sets - 10 reps - Seated Gluteal Sets  - 1 x daily - 7 x weekly - 1 sets -  10 reps - 3 sec hold - Sit to Stand with Resistance Around Legs  - 1 x daily - 7 x weekly - 2 sets - 10 reps  ASSESSMENT:  CLINICAL IMPRESSION: Patient is a 73 y.o. M who was seen today for physical therapy evaluation and treatment s/p L hip constrained liner revision (10/01/23). Pt states his L hip has been dislocated 3x and he has gotten surgery on it 3x. PMH of Parkinson's with prior neck and back surgery. Assessment significant for gross bilat LE weakness, L>R hip tightness in hamstrings/hip flexors and glutes, and high fall risk based on his Berg  Balance. Pt will benefit from PT to address these deficits to improve his safety with home and community amb.   OBJECTIVE IMPAIRMENTS: Abnormal gait, decreased activity tolerance, decreased balance, decreased coordination, decreased endurance, decreased mobility, difficulty walking, decreased ROM, decreased strength, hypomobility, increased edema, increased fascial restrictions, increased muscle spasms, impaired flexibility, impaired tone, improper body mechanics, postural dysfunction, and pain.   ACTIVITY LIMITATIONS: carrying, lifting, standing, squatting, stairs, transfers, bathing, toileting, dressing, reach over head, hygiene/grooming, and locomotion level  PARTICIPATION LIMITATIONS: meal prep, cleaning, laundry, driving, shopping, community activity, and yard work  PERSONAL FACTORS: Age, Fitness, Past/current experiences, and Time since onset of injury/illness/exacerbation are also affecting patient's functional outcome.   REHAB POTENTIAL: Good  CLINICAL DECISION MAKING: Evolving/moderate complexity  EVALUATION COMPLEXITY: Moderate   GOALS: Goals reviewed with patient? Yes  SHORT TERM GOALS: Target date: 12/03/2023  Pt will be ind with initial HEP Baseline: Goal status: INITIAL  2.  Pt will have improved 5x STS to </=13 sec to demo increased functional LE strength Baseline:  Goal status: INITIAL  LONG TERM GOALS: Target date: 12/31/2023   Pt will be ind with management and progression of HEP Baseline:  Goal status: INITIAL  2.  Pt will have improved Berg Balance Score to >/=45 to demo decreased fall risk Baseline:  Goal status: INITIAL  3.  Pt will be able to alternate tapping his feet on a step without R knee hyperextension throwing off his balance to demo improving LE Stability for safe stair and curb negotiation Baseline:  Goal status: INITIAL  4.  Pt will have improved LEFS score to >/=46/80 to demo MCID Baseline:  Goal status: INITIAL    PLAN:  PT  FREQUENCY: 2x/week  PT DURATION: 8 weeks  PLANNED INTERVENTIONS: 97164- PT Re-evaluation, 97110-Therapeutic exercises, 97530- Therapeutic activity, 97112- Neuromuscular re-education, 97535- Self Care, 78295- Manual therapy, [phone]- Gait training, [phone]- Aquatic Therapy, [phone]- Electrical stimulation (unattended), [phone]- Ionotophoresis 4mg /ml Dexamethasone , Patient/Family education, Balance training, Stair training, Taping, Dry Needling, Joint mobilization, Spinal mobilization, Cryotherapy, and Moist heat  PLAN FOR NEXT SESSION: Assess response to HEP. Work on decreasing R knee hyperextension and reducing posterior LOBs. Gross bilat hip strengthening and core strengthening.    Cuthbert Turton April Ma L Yoshiaki Kreuser, PT 11/05/2023, 1:07 PM

## 2023-11-07 ENCOUNTER — Encounter: Payer: Self-pay | Admitting: Physical Therapy

## 2023-11-07 ENCOUNTER — Ambulatory Visit: Admitting: Physical Therapy

## 2023-11-07 DIAGNOSIS — M6281 Muscle weakness (generalized): Secondary | ICD-10-CM

## 2023-11-07 DIAGNOSIS — R29818 Other symptoms and signs involving the nervous system: Secondary | ICD-10-CM

## 2023-11-07 DIAGNOSIS — R293 Abnormal posture: Secondary | ICD-10-CM

## 2023-11-07 DIAGNOSIS — R2681 Unsteadiness on feet: Secondary | ICD-10-CM

## 2023-11-07 DIAGNOSIS — R2689 Other abnormalities of gait and mobility: Secondary | ICD-10-CM

## 2023-11-07 NOTE — Therapy (Signed)
 OUTPATIENT PHYSICAL THERAPY LOWER EXTREMITY EVALUATION   Patient Name: THORSEN SPIKER MRN: [id number] DOB:02/11/1951, 73 y.o., male Today's Date: 11/07/2023  END OF SESSION:  PT End of Session - 11/07/23 1015     Visit Number 2    PT Start Time 1015    PT Stop Time 1100    PT Time Calculation (min) 45 min    Activity Tolerance Patient tolerated treatment well    Behavior During Therapy [facility] for tasks assessed/performed             Past Medical History:  Diagnosis Date   Abnormal gait    due to parkinson's,  uses cane   Absolute anemia 03/13/2023   Allergic rhinitis    Allergy Spring 1998   Allergic to dustmits, dog hair and roaches.   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 Geralyn Knee  @[facility]  [facility]urology   Pneumonia    Sleep apnea    Vitamin B12 deficiency    Vitamin D  deficiency    Wears glasses    Past Surgical History:  Procedure Laterality Date   APPENDECTOMY  07/1964   APPENDECTOMY     CHOLECYSTECTOMY     JOINT REPLACEMENT  March 2019 and again on 09/10/2021   Left hip twice.  2nd time due to 3 dislocations   LAPAROSCOPIC CHOLECYSTECTOMY  01-21-2017   @[facility] , [place]   lower back surgery      POSTERIOR CERVICAL FUSION/FORAMINOTOMY     POSTERIOR FUSION CERVICAL SPINE  08-15-2011   @[facility] , [place]   w/ laminectomy and decompression-- C3 -- T1   POSTERIOR LAMINECTOMY / DECOMPRESSION LUMBAR SPINE  07-30-2013   @[facility] , [place]   bilateral T11 - 12,  L2 --5   REMOVAL LEFT T1 PARASPINOUS MASS   02-12-2016   @[facility] ,  [place]   desmoid fibromatosis   SHOULDER ARTHROSCOPY WITH ROTATOR CUFF REPAIR Left 12/15/2018   Procedure: SHOULDER ARTHROSCOPY, biceps tenodesis labored Debridement, submacromial decompression;  Surgeon: Genevie Kerns, MD;  Location: [facility];  Service: Orthopedics;  Laterality: Left;  interscalene block   SPINE SURGERY     TOTAL HIP ARTHROPLASTY Left 09/18/2017   @[facility] , [place]   TOTAL HIP REVISION Left 09/10/2021   Procedure: Left hip acetabular versus total hip arthroplasty revision, constrained liner;  Surgeon: Liliane Rei, MD;  Location: [facility];  Service: Orthopedics;  Laterality: Left;   TOTAL HIP REVISION Left 10/01/2023   Procedure: Left hip constrained liner revision;  Surgeon: Liliane Rei, MD;  Location: [facility];  Service: Orthopedics;  Laterality: Left;    Patient Active Problem List   Diagnosis Date Noted   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   Thrombocytopenia (HCC) 03/17/2023   Absolute anemia 03/13/2023   Family history of prostate cancer 03/13/2023   History of elevated PSA 03/13/2023   AV block, 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  Drug induced constipation 12/30/2022   Acquired hypothyroidism 12/30/2022   Primary osteoarthritis of left hip 05/18/2021   PD (Parkinson's disease) (HCC) 05/18/2021   History of gastroesophageal reflux (GERD) 05/18/2021   OSA (obstructive sleep apnea) 05/18/2021   Essential hypertension 05/18/2021    PCP: Louis Row, MD  REFERRING PROVIDER: Perla Bradford, PA  REFERRING DIAG: [phone]: Encounter for other orthopedic aftercare S/P left hip constrained liner revision  THERAPY DIAG:  Muscle weakness (generalized)  Unsteadiness on feet  Other abnormalities of gait and mobility  Abnormal posture  Other symptoms and signs involving the nervous system  Rationale for Evaluation and Treatment: Rehabilitation  ONSET DATE:  10/01/23 L hip revision  SUBJECTIVE:   SUBJECTIVE STATEMENT: Patient reports no falls since earlier in the week, he reports the the HEP is doing fine, denies any pain  Pt reports he had a L hip replacement in 2019 and dislocated it 3 times. Redid it 2023, had a fall and jammed it and had to revise it 10/01/23. Was told not to go too heavy over 110 lbs on the leg press. Was told not to cross his legs. Will be seeing ortho 11/11/23 and will further clarify precautions. Pt feels his greatest issue is strength. Has not yet returned to the gym. Has been walking some but wants to walk more. Plans to do water  aerobics. Uses his 4 wheel walker at baseline.  PERTINENT HISTORY: Parkinson's (Last saw PT for this in July 2024), back problems (surgery in neck and low back), R ankle rolls  PAIN:  Are you having pain? No  PRECAUTIONS: Fall  RED FLAGS: None   WEIGHT BEARING RESTRICTIONS: No  FALLS:  Has patient fallen in last 6 months? Yes. Number of falls 3 -- last fall yesterday getting pizza out of the stove  LIVING ENVIRONMENT: Lives with: lives with their spouse and daughter Lives in: House/apartment Stairs: Yes: Internal: 14 steps; can reach both Has following equipment at home: Otho Blitz - 4 wheeled  OCCUPATION: Retired -- reads, tries to exercise, watches TV  PLOF: Independent  PATIENT GOALS: Return to exercise and walking  NEXT MD VISIT: n/a  OBJECTIVE:  Note: Objective measures were completed at Evaluation unless otherwise noted.  DIAGNOSTIC FINDINGS: x-ray on R foot and ankle 03/25/22: Ankle joint space maintained. Elevated first ray. Midfoot arthritic changes present.   PATIENT SURVEYS:  LEFS: 37/80 = 46.25%  COGNITION: Overall cognitive status: Within functional limits for tasks assessed     SENSATION: WFL  EDEMA:  None now  MUSCLE LENGTH: Hamstrings: Right ~80 deg; Left ~70 deg Thomas test: Right 0 deg; Left 0 deg  POSTURE: rounded shoulders, forward head, increased  thoracic kyphosis, and flexed trunk , R knee hyperextends and comes into varus, L knee goes into valgus  PALPATION: No overt tenderness to palpation  LOWER EXTREMITY ROM:  Active ROM Right eval Left eval  Hip flexion    Hip extension    Hip abduction    Hip adduction    Hip internal rotation    Hip external rotation    Knee flexion    Knee extension    Ankle dorsiflexion    Ankle plantarflexion    Ankle inversion    Ankle eversion     (Blank rows = not tested)  LOWER EXTREMITY MMT:  MMT Right eval Left eval  Hip flexion 5 5  Hip extension 3+ 3+  Hip abduction 3 3  Hip adduction    Hip internal rotation  Hip external rotation    Knee flexion 5 4  Knee extension 5 5  Ankle dorsiflexion    Ankle plantarflexion    Ankle inversion    Ankle eversion     (Blank rows = not tested)  LOWER EXTREMITY SPECIAL TESTS:  Did not assess  FUNCTIONAL TESTS:  5 times sit to stand: 15.1 sec without UEs Timed up and go (TUG): 13.77 sec Berg Balance Scale: 39/56     GAIT: Distance walked: Into clinic Assistive device utilized: Environmental consultant - 4 wheeled Level of assistance: SBA Comments: R knee hyperextension during stance, increased forward lean, scissors with R LE                                                                                                                                TREATMENT DATE:  11/07/23 Nustep level 5 x 6 minutes Seated LAQ 4# 2x10 each leg Standing with walker 4# marches 2x10 4# abduction 4# extension Ball b/n knees seated squeeze Green tband clamshells Supine feet on ball K2C, rotation, small bridge, isometric abs Small weighted ball 4.4# STS then sit to stand with reach Back to wall small butt touches to wall to get COG  11/05/23  See HEP below   PATIENT EDUCATION:  Education details: Exam findings, POC, initial HEP Person educated: Patient Education method: Explanation, Demonstration, and Handouts Education comprehension: verbalized  understanding, returned demonstration, and needs further education  HOME EXERCISE PROGRAM: Access Code: [email or web address] URL: [email or web address] Date: 11/05/2023 Prepared by: Gellen April Erman Hayward  Exercises - Seated Hip Abduction with Resistance  - 1 x daily - 7 x weekly - 2 sets - 10 reps - Seated March with Resistance  - 1 x daily - 7 x weekly - 2 sets - 10 reps - Seated Gluteal Sets  - 1 x daily - 7 x weekly - 1 sets - 10 reps - 3 sec hold - Sit to Stand with Resistance Around Legs  - 1 x daily - 7 x weekly - 2 sets - 10 reps  ASSESSMENT:  CLINICAL IMPRESSION: Patient comes in with LE gait that appears to be very spastic. Tends to scissor and drag toes.  He tolerated an initiation of exercises, to work on strength, endurance and ROM to help with function.  With the STS activity, he tends to hyperextend the right knee and then is back on his heels and loses balance to the back  Patient is a 73 y.o. M who was seen today for physical therapy evaluation and treatment s/p L hip constrained liner revision (10/01/23). Pt states his L hip has been dislocated 3x and he has gotten surgery on it 3x. PMH of Parkinson's with prior neck and back surgery. Assessment significant for gross bilat LE weakness, L>R hip tightness in hamstrings/hip flexors and glutes, and high fall risk based on his Berg Balance. Pt will benefit from PT to address these deficits to improve his safety with home  and community amb.   OBJECTIVE IMPAIRMENTS: Abnormal gait, decreased activity tolerance, decreased balance, decreased coordination, decreased endurance, decreased mobility, difficulty walking, decreased ROM, decreased strength, hypomobility, increased edema, increased fascial restrictions, increased muscle spasms, impaired flexibility, impaired tone, improper body mechanics, postural dysfunction, and pain.   ACTIVITY LIMITATIONS: carrying, lifting, standing, squatting, stairs, transfers, bathing, toileting,  dressing, reach over head, hygiene/grooming, and locomotion level  PARTICIPATION LIMITATIONS: meal prep, cleaning, laundry, driving, shopping, community activity, and yard work  PERSONAL FACTORS: Age, Fitness, Past/current experiences, and Time since onset of injury/illness/exacerbation are also affecting patient's functional outcome.   REHAB POTENTIAL: Good  CLINICAL DECISION MAKING: Evolving/moderate complexity  EVALUATION COMPLEXITY: Moderate   GOALS: Goals reviewed with patient? Yes  SHORT TERM GOALS: Target date: 12/03/2023  Pt will be ind with initial HEP Baseline: Goal status: INITIAL  2.  Pt will have improved 5x STS to </=13 sec to demo increased functional LE strength Baseline:  Goal status: INITIAL  LONG TERM GOALS: Target date: 12/31/2023   Pt will be ind with management and progression of HEP Baseline:  Goal status: INITIAL  2.  Pt will have improved Berg Balance Score to >/=45 to demo decreased fall risk Baseline:  Goal status: INITIAL  3.  Pt will be able to alternate tapping his feet on a step without R knee hyperextension throwing off his balance to demo improving LE Stability for safe stair and curb negotiation Baseline:  Goal status: INITIAL  4.  Pt will have improved LEFS score to >/=46/80 to demo MCID Baseline:  Goal status: INITIAL    PLAN:  PT FREQUENCY: 2x/week  PT DURATION: 8 weeks  PLANNED INTERVENTIONS: 97164- PT Re-evaluation, 97110-Therapeutic exercises, 97530- Therapeutic activity, 97112- Neuromuscular re-education, 97535- Self Care, 16109- Manual therapy, [phone]- Gait training, [phone]- Aquatic Therapy, [phone]- Electrical stimulation (unattended), [phone]- Ionotophoresis 4mg /ml Dexamethasone , Patient/Family education, Balance training, Stair training, Taping, Dry Needling, Joint mobilization, Spinal mobilization, Cryotherapy, and Moist heat  PLAN FOR NEXT SESSION: Assess response to HEP. Work on decreasing R knee hyperextension and reducing  posterior LOBs. Gross bilat hip strengthening and core strengthening.    Hollis Lurie, PT 11/07/2023, 10:15 AM

## 2023-11-12 ENCOUNTER — Ambulatory Visit: Admitting: Physical Therapy

## 2023-11-12 DIAGNOSIS — R293 Abnormal posture: Secondary | ICD-10-CM

## 2023-11-12 DIAGNOSIS — M6281 Muscle weakness (generalized): Secondary | ICD-10-CM | POA: Diagnosis not present

## 2023-11-12 DIAGNOSIS — R2681 Unsteadiness on feet: Secondary | ICD-10-CM

## 2023-11-12 DIAGNOSIS — R2689 Other abnormalities of gait and mobility: Secondary | ICD-10-CM

## 2023-11-12 NOTE — Therapy (Signed)
 OUTPATIENT PHYSICAL THERAPY LOWER EXTREMITY TREATMENT   Patient Name: TRUE COWDIN MRN: [id number] DOB:Oct 01, 1950, 73 y.o., male Today's Date: 11/12/2023  END OF SESSION:  PT End of Session - 11/12/23 0932     Visit Number 3    PT Start Time 0931    PT Stop Time 1010    PT Time Calculation (min) 39 min    Activity Tolerance Patient tolerated treatment well    Behavior During Therapy [facility] for tasks assessed/performed              Past Medical History:  Diagnosis Date   Abnormal gait    due to parkinson's,  uses cane   Absolute anemia 03/13/2023   Allergic rhinitis    Allergy Spring 1998   Allergic to dustmits, dog hair and roaches.   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 Geralyn Knee  @[facility]  Neurology   Pneumonia    Sleep apnea    Vitamin B12 deficiency    Vitamin D  deficiency    Wears glasses    Past Surgical History:  Procedure Laterality Date   APPENDECTOMY  07/1964   APPENDECTOMY     CHOLECYSTECTOMY     JOINT REPLACEMENT  March 2019 and again on 09/10/2021   Left hip twice.  2nd time due to 3 dislocations   LAPAROSCOPIC CHOLECYSTECTOMY  01-21-2017   @[facility] , [place]   lower back surgery      POSTERIOR CERVICAL FUSION/FORAMINOTOMY     POSTERIOR FUSION CERVICAL SPINE  08-15-2011   @[facility] , [place]   w/ laminectomy and decompression-- C3 -- T1   POSTERIOR LAMINECTOMY / DECOMPRESSION LUMBAR SPINE  07-30-2013   @[facility] ,    bilateral T11 - 12,  L2 --5   REMOVAL LEFT T1 PARASPINOUS  MASS  02-12-2016   @[facility] ,  [place]   desmoid fibromatosis   SHOULDER ARTHROSCOPY WITH ROTATOR CUFF REPAIR Left 12/15/2018   Procedure: SHOULDER ARTHROSCOPY, biceps tenodesis labored Debridement, submacromial decompression;  Surgeon: Genevie Kerns, MD;  Location: [facility];  Service: Orthopedics;  Laterality: Left;  interscalene block   SPINE SURGERY     TOTAL HIP ARTHROPLASTY Left 09/18/2017   @[facility] , [place]   TOTAL HIP REVISION Left 09/10/2021   Procedure: Left hip acetabular versus total hip arthroplasty revision, constrained liner;  Surgeon: Liliane Rei, MD;  Location: [facility];  Service: Orthopedics;  Laterality: Left;   TOTAL HIP REVISION Left 10/01/2023   Procedure: Left hip constrained liner revision;  Surgeon: Liliane Rei, MD;  Location: [facility];  Service: Orthopedics;  Laterality: Left;    Patient Active Problem List   Diagnosis Date Noted   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   Thrombocytopenia (HCC) 03/17/2023   Absolute anemia 03/13/2023   Family history of prostate cancer 03/13/2023   History of elevated PSA 03/13/2023   AV block, 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  Drug induced constipation 12/30/2022   Acquired hypothyroidism 12/30/2022   Primary osteoarthritis of left hip 05/18/2021   PD (Parkinson's disease) (HCC) 05/18/2021   History of gastroesophageal reflux (GERD) 05/18/2021   OSA (obstructive sleep apnea) 05/18/2021   Essential hypertension 05/18/2021    PCP: Louis Row, MD  REFERRING PROVIDER: Perla Bradford, PA  REFERRING DIAG: [phone]: Encounter for other orthopedic aftercare S/P left hip constrained liner revision  THERAPY DIAG:  Muscle weakness (generalized)  Unsteadiness on feet  Other abnormalities of gait and mobility  Abnormal posture  Rationale for Evaluation and Treatment: Rehabilitation  ONSET DATE: 10/01/23 L hip revision  SUBJECTIVE:   SUBJECTIVE  STATEMENT: Pt states he saw the doctor and was told that things are looking good. Able to lean forward with increased ease. Will follow up with ortho again in 5 weeks.   Pt reports he had a L hip replacement in 2019 and dislocated it 3 times. Redid it 2023, had a fall and jammed it and had to revise it 10/01/23. Was told not to go too heavy over 110 lbs on the leg press. Was told not to cross his legs. Will be seeing ortho 11/11/23 and will further clarify precautions. Pt feels his greatest issue is strength. Has not yet returned to the gym. Has been walking some but wants to walk more. Plans to do water  aerobics. Uses his 4 wheel walker at baseline.  PERTINENT HISTORY: Parkinson's (Last saw PT for this in July 2024), back problems (surgery in neck and low back), R ankle rolls  PAIN:  Are you having pain? No  PRECAUTIONS: Fall  RED FLAGS: None   WEIGHT BEARING RESTRICTIONS: No  FALLS:  Has patient fallen in last 6 months? Yes. Number of falls 3 -- last fall yesterday getting pizza out of the stove  LIVING ENVIRONMENT: Lives with: lives with their spouse and daughter Lives in: House/apartment Stairs: Yes: Internal: 14 steps; can reach both Has following equipment at home: Otho Blitz - 4 wheeled  OCCUPATION: Retired -- reads, tries to exercise, watches TV  PLOF: Independent  PATIENT GOALS: Return to exercise and walking  NEXT MD VISIT: n/a  OBJECTIVE:  Note: Objective measures were completed at Evaluation unless otherwise noted.  DIAGNOSTIC FINDINGS: x-ray on R foot and ankle 03/25/22: Ankle joint space maintained. Elevated first ray. Midfoot arthritic changes present.   PATIENT SURVEYS:  LEFS: 37/80 = 46.25%  COGNITION: Overall cognitive status: Within functional limits for tasks assessed     SENSATION: WFL  EDEMA:  None now  MUSCLE LENGTH: Hamstrings: Right ~80 deg; Left ~70 deg Thomas test: Right 0 deg; Left 0 deg  POSTURE: rounded shoulders, forward head, increased  thoracic kyphosis, and flexed trunk , R knee hyperextends and comes into varus, L knee goes into valgus  PALPATION: No overt tenderness to palpation  LOWER EXTREMITY ROM:  Active ROM Right eval Left eval  Hip flexion    Hip extension    Hip abduction    Hip adduction    Hip internal rotation    Hip external rotation    Knee flexion    Knee extension    Ankle dorsiflexion    Ankle plantarflexion    Ankle inversion    Ankle eversion     (Blank rows = not tested)  LOWER EXTREMITY MMT:  MMT Right eval Left eval  Hip flexion 5 5  Hip extension 3+ 3+  Hip abduction 3 3  Hip adduction    Hip internal rotation  Hip external rotation    Knee flexion 5 4  Knee extension 5 5  Ankle dorsiflexion    Ankle plantarflexion    Ankle inversion    Ankle eversion     (Blank rows = not tested)  LOWER EXTREMITY SPECIAL TESTS:  Did not assess  FUNCTIONAL TESTS:  5 times sit to stand: 15.1 sec without UEs Timed up and go (TUG): 13.77 sec Berg Balance Scale: 39/56     GAIT: Distance walked: Into clinic Assistive device utilized: Walker - 4 wheeled Level of assistance: SBA Comments: R knee hyperextension during stance, increased forward lean, scissors with R LE, decreased heel strike                                                                                                                                TREATMENT DATE:  11/12/23 Nustep L5 x 6 min Seated PWR! up red TB around thighs 2x10 Seated sit<>stand red TB around thighs focus on glute set 2x10 Standing PWR! Step back red TB around thighs 2x10 Standing PWR! Side step red TB around thighs 2x10 Standing terminal knee ext red TB focusing on eccentric control to decrease knee hyperextension 3x10 Eccentric sit to stand x10 Amb with rollator, focusing on hip extension and diminishing knee hyperextension with improved heel strike noted x 1 lap around gym  11/07/23 Nustep level 5 x 6 minutes Seated LAQ 4# 2x10 each  leg Standing with walker 4# marches 2x10 4# abduction 4# extension Ball b/n knees seated squeeze Green tband clamshells Supine feet on ball K2C, rotation, small bridge, isometric abs Small weighted ball 4.4# STS then sit to stand with reach Back to wall small butt touches to wall to get COG  11/05/23  See HEP below   PATIENT EDUCATION:  Education details: Exam findings, POC, initial HEP Person educated: Patient Education method: Explanation, Demonstration, and Handouts Education comprehension: verbalized understanding, returned demonstration, and needs further education  HOME EXERCISE PROGRAM: Access Code: [email or web address] URL: [email or web address] Date: 11/05/2023 Prepared by: Venecia Mehl April Erman Hayward  Exercises - Seated Hip Abduction with Resistance  - 1 x daily - 7 x weekly - 2 sets - 10 reps - Seated March with Resistance  - 1 x daily - 7 x weekly - 2 sets - 10 reps - Seated Gluteal Sets  - 1 x daily - 7 x weekly - 1 sets - 10 reps - 3 sec hold - Sit to Stand with Resistance Around Legs  - 1 x daily - 7 x weekly - 2 sets - 10 reps  ASSESSMENT:  CLINICAL IMPRESSION: Treatment focused on PWR! Moves with focus on hip strengthening with red TB. Minor LOBs noted especially with side stepping requiring min A to correct. Worked on eccentric quad strength and control to help diminish R knee hyperextension. Worked on improving gait with focus on hip extension and found pt to have improved heel strike and less scissoring pattern.   Patient  is a 73 y.o. M who was seen today for physical therapy evaluation and treatment s/p L hip constrained liner revision (10/01/23). Pt states his L hip has been dislocated 3x and he has gotten surgery on it 3x. PMH of Parkinson's with prior neck and back surgery. Assessment significant for gross bilat LE weakness, L>R hip tightness in hamstrings/hip flexors and glutes, and high fall risk based on his Berg Balance. Pt will benefit from PT to address  these deficits to improve his safety with home and community amb.   OBJECTIVE IMPAIRMENTS: Abnormal gait, decreased activity tolerance, decreased balance, decreased coordination, decreased endurance, decreased mobility, difficulty walking, decreased ROM, decreased strength, hypomobility, increased edema, increased fascial restrictions, increased muscle spasms, impaired flexibility, impaired tone, improper body mechanics, postural dysfunction, and pain.   ACTIVITY LIMITATIONS: carrying, lifting, standing, squatting, stairs, transfers, bathing, toileting, dressing, reach over head, hygiene/grooming, and locomotion level  PARTICIPATION LIMITATIONS: meal prep, cleaning, laundry, driving, shopping, community activity, and yard work  PERSONAL FACTORS: Age, Fitness, Past/current experiences, and Time since onset of injury/illness/exacerbation are also affecting patient's functional outcome.   REHAB POTENTIAL: Good  CLINICAL DECISION MAKING: Evolving/moderate complexity  EVALUATION COMPLEXITY: Moderate   GOALS: Goals reviewed with patient? Yes  SHORT TERM GOALS: Target date: 12/03/2023  Pt will be ind with initial HEP Baseline: Goal status: INITIAL  2.  Pt will have improved 5x STS to </=13 sec to demo increased functional LE strength Baseline:  Goal status: INITIAL  LONG TERM GOALS: Target date: 12/31/2023   Pt will be ind with management and progression of HEP Baseline:  Goal status: INITIAL  2.  Pt will have improved Berg Balance Score to >/=45 to demo decreased fall risk Baseline:  Goal status: INITIAL  3.  Pt will be able to alternate tapping his feet on a step without R knee hyperextension throwing off his balance to demo improving LE Stability for safe stair and curb negotiation Baseline:  Goal status: INITIAL  4.  Pt will have improved LEFS score to >/=46/80 to demo MCID Baseline:  Goal status: INITIAL    PLAN:  PT FREQUENCY: 2x/week  PT DURATION: 8 weeks  PLANNED  INTERVENTIONS: 97164- PT Re-evaluation, 97110-Therapeutic exercises, 97530- Therapeutic activity, 97112- Neuromuscular re-education, 97535- Self Care, 73220- Manual therapy, [phone]- Gait training, [phone]- Aquatic Therapy, [phone]- Electrical stimulation (unattended), [phone]- Ionotophoresis 4mg /ml Dexamethasone , Patient/Family education, Balance training, Stair training, Taping, Dry Needling, Joint mobilization, Spinal mobilization, Cryotherapy, and Moist heat  PLAN FOR NEXT SESSION: Assess response to HEP. Work on decreasing R knee hyperextension and reducing posterior LOBs. Gross bilat hip strengthening and core strengthening.    Lydia Meng April Ma L Carrol Hougland, PT 11/12/2023, 9:33 AM

## 2023-11-19 ENCOUNTER — Encounter

## 2023-11-20 ENCOUNTER — Ambulatory Visit: Payer: Self-pay

## 2023-11-20 DIAGNOSIS — R2681 Unsteadiness on feet: Secondary | ICD-10-CM

## 2023-11-20 DIAGNOSIS — R293 Abnormal posture: Secondary | ICD-10-CM

## 2023-11-20 DIAGNOSIS — M6281 Muscle weakness (generalized): Secondary | ICD-10-CM

## 2023-11-20 DIAGNOSIS — R29818 Other symptoms and signs involving the nervous system: Secondary | ICD-10-CM

## 2023-11-20 DIAGNOSIS — R2689 Other abnormalities of gait and mobility: Secondary | ICD-10-CM

## 2023-11-20 NOTE — Therapy (Signed)
 OUTPATIENT PHYSICAL THERAPY LOWER EXTREMITY TREATMENT   Patient Name: Jim Carson MRN: [id number] DOB:08-25-1950, 73 y.o., male Today's Date: 11/20/2023  END OF SESSION:     Past Medical History:  Diagnosis Date   Abnormal gait    due to parkinson's,  uses cane   Absolute anemia 03/13/2023   Allergic rhinitis    Allergy Spring 1998   Allergic to dustmits, dog hair and roaches.   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 Geralyn Knee  @[facility]   Pneumonia    Sleep apnea    Vitamin B12 deficiency    Vitamin D  deficiency    Wears glasses    Past Surgical History:  Procedure Laterality Date   APPENDECTOMY  07/1964   APPENDECTOMY     CHOLECYSTECTOMY     JOINT REPLACEMENT  March 2019 and again on 09/10/2021   Left hip twice.  2nd time due to 3 dislocations   LAPAROSCOPIC CHOLECYSTECTOMY  01-21-2017   @[facility] , [place]   lower back surgery      POSTERIOR CERVICAL FUSION/FORAMINOTOMY     POSTERIOR FUSION CERVICAL SPINE  08-15-2011   @[facility] , [place]   w/ laminectomy and decompression-- C3 -- T1   POSTERIOR LAMINECTOMY / DECOMPRESSION LUMBAR SPINE  07-30-2013   @[facility] , [place]   bilateral T11 - 12,  L2 --5   REMOVAL LEFT T1 PARASPINOUS MASS  02-12-2016   @[facility] ,  [place]   desmoid fibromatosis   SHOULDER ARTHROSCOPY WITH ROTATOR CUFF REPAIR Left 12/15/2018   Procedure: SHOULDER ARTHROSCOPY, biceps tenodesis labored Debridement, submacromial decompression;  Surgeon: Genevie Kerns, MD;  Location:  [facility];  Service: Orthopedics;  Laterality: Left;  interscalene block   SPINE SURGERY     TOTAL HIP ARTHROPLASTY Left 09/18/2017   @[facility] , [place]   TOTAL HIP REVISION Left 09/10/2021   Procedure: Left hip acetabular versus total hip arthroplasty revision, constrained liner;  Surgeon: Liliane Rei, MD;  Location: [facility];  Service: Orthopedics;  Laterality: Left;   TOTAL HIP REVISION Left 10/01/2023   Procedure: Left hip constrained liner revision;  Surgeon: Liliane Rei, MD;  Location: [facility];  Service: Orthopedics;  Laterality: Left;    Patient Active Problem List   Diagnosis Date Noted   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   Thrombocytopenia (HCC) 03/17/2023   Absolute anemia 03/13/2023   Family history of prostate cancer 03/13/2023   History of elevated PSA 03/13/2023   AV block, 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   Drug induced constipation 12/30/2022   Acquired hypothyroidism 12/30/2022   Primary osteoarthritis of left hip 05/18/2021   PD (Parkinson's disease) (HCC) 05/18/2021   History of gastroesophageal reflux (GERD) 05/18/2021   OSA (obstructive sleep apnea) 05/18/2021   Essential hypertension 05/18/2021    PCP: Louis Row, MD  REFERRING PROVIDER: Perla Bradford, PA  REFERRING DIAG: [id number]: Encounter for  other orthopedic aftercare S/P left hip constrained liner revision  THERAPY DIAG:  Muscle weakness (generalized)  Unsteadiness on feet  Other abnormalities of gait and mobility  Abnormal posture  Other symptoms and signs involving the nervous system  Rationale for Evaluation and Treatment: Rehabilitation  ONSET DATE: 10/01/23 L hip revision  SUBJECTIVE:   SUBJECTIVE STATEMENT: Doing good, exercises are going well.    Pt reports he had a L hip replacement in 2019 and dislocated it 3 times. Redid it 2023, had a fall and jammed it and had to revise it 10/01/23. Was told not  to go too heavy over 110 lbs on the leg press. Was told not to cross his legs. Will be seeing ortho 11/11/23 and will further clarify precautions. Pt feels his greatest issue is strength. Has not yet returned to the gym. Has been walking some but wants to walk more. Plans to do water  aerobics. Uses his 4 wheel walker at baseline.  PERTINENT HISTORY: Parkinson's (Last saw PT for this in July 2024), back problems (surgery in neck and low back), R ankle rolls  PAIN:  Are you having pain? No  PRECAUTIONS: Fall  RED FLAGS: None   WEIGHT BEARING RESTRICTIONS: No  FALLS:  Has patient fallen in last 6 months? Yes. Number of falls 3 -- last fall yesterday getting pizza out of the stove  LIVING ENVIRONMENT: Lives with: lives with their spouse and daughter Lives in: House/apartment Stairs: Yes: Internal: 14 steps; can reach both Has following equipment at home: Otho Blitz - 4 wheeled  OCCUPATION: Retired -- reads, tries to exercise, watches TV  PLOF: Independent  PATIENT GOALS: Return to exercise and walking  NEXT MD VISIT: n/a  OBJECTIVE:  Note: Objective measures were completed at Evaluation unless otherwise noted.  DIAGNOSTIC FINDINGS: x-ray on R foot and ankle 03/25/22: Ankle joint space maintained. Elevated first ray. Midfoot arthritic changes present.   PATIENT SURVEYS:  LEFS: 37/80 = 46.25%  COGNITION: Overall cognitive status: Within functional limits for tasks assessed     SENSATION: WFL  EDEMA:  None now  MUSCLE LENGTH: Hamstrings: Right ~80 deg; Left ~70 deg Thomas test: Right 0 deg; Left 0 deg  POSTURE: rounded shoulders, forward head, increased thoracic kyphosis, and flexed trunk , R knee hyperextends and comes into varus, L knee goes into valgus  PALPATION: No overt tenderness to palpation  LOWER EXTREMITY ROM:  Active ROM Right eval Left eval  Hip flexion    Hip extension    Hip abduction    Hip adduction    Hip internal rotation    Hip external  rotation    Knee flexion    Knee extension    Ankle dorsiflexion    Ankle plantarflexion    Ankle inversion    Ankle eversion     (Blank rows = not tested)  LOWER EXTREMITY MMT:  MMT Right eval Left eval  Hip flexion 5 5  Hip extension 3+ 3+  Hip abduction 3 3  Hip adduction    Hip internal rotation    Hip external rotation    Knee flexion 5 4  Knee extension 5 5  Ankle dorsiflexion    Ankle plantarflexion    Ankle inversion    Ankle eversion     (Blank rows = not tested)  LOWER EXTREMITY SPECIAL TESTS:  Did not assess  FUNCTIONAL TESTS:  5 times sit to stand: 15.1 sec without UEs Timed up and go (TUG): 13.77 sec Berg Balance Scale: 39/56  GAIT: Distance walked: Into clinic Assistive device utilized: Walker - 4 wheeled Level of assistance: SBA Comments: R knee hyperextension during stance, increased forward lean, scissors with R LE, decreased heel strike                                                                                                                                TREATMENT DATE:  11/20/23 Nustep L5 x 6 min Standing hip abduction, extension, marching RTB at thighs 2x10 Retro step x 15 Church pews x 15 Seated LAQ 3lb x 10 BLE Seated rows RTB x 20 Seated shoulder RTB x 15  11/12/23 Nustep L5 x 6 min Seated PWR! up red TB around thighs 2x10 Seated sit<>stand red TB around thighs focus on glute set 2x10 Standing PWR! Step back red TB around thighs 2x10 Standing PWR! Side step red TB around thighs 2x10 Standing terminal knee ext red TB focusing on eccentric control to decrease knee hyperextension 3x10 Eccentric sit to stand x10 Amb with rollator, focusing on hip extension and diminishing knee hyperextension with improved heel strike noted x 1 lap around gym  11/07/23 Nustep level 5 x 6 minutes Seated LAQ 4# 2x10 each leg Standing with walker 4# marches 2x10 4# abduction 4# extension Ball b/n knees seated squeeze Green tband  clamshells Supine feet on ball K2C, rotation, small bridge, isometric abs Small weighted ball 4.4# STS then sit to stand with reach Back to wall small butt touches to wall to get COG  11/05/23  See HEP below   PATIENT EDUCATION:  Education details: Exam findings, POC, initial HEP Person educated: Patient Education method: Explanation, Demonstration, and Handouts Education comprehension: verbalized understanding, returned demonstration, and needs further education  HOME EXERCISE PROGRAM: Access Code: [email or web address] URL: [email or web address] Date: 11/20/2023 Prepared by: Zakk Borgen  Exercises - Seated Hip Abduction with Resistance  - 1 x daily - 7 x weekly - 2 sets - 10 reps - Seated March with Resistance  - 1 x daily - 7 x weekly - 2 sets - 10 reps - Seated Gluteal Sets  - 1 x daily - 7 x weekly - 1 sets - 10 reps - 3 sec hold - Sit to Stand with Resistance Around Legs  - 1 x daily - 7 x weekly - 2 sets - 10 reps - Standing Hip Abduction with Resistance at Ankles and Counter Support  - 1 x daily - 7 x weekly - 2 sets - 10 reps - Standing Hip Extension with Resistance at Ankles and Counter Support  - 1 x daily - 7 x weekly - 2 sets - 10 reps  ASSESSMENT:  CLINICAL IMPRESSION: Treatment focused on HEP progressing since he will be out of town the next 2 weeks. Cues needed to avoid hyperextension of R knee when standing and cues to go slow with exercises and for control.  Patient is a 73 y.o. M who was seen today for physical therapy evaluation  and treatment s/p L hip constrained liner revision (10/01/23). Pt states his L hip has been dislocated 3x and he has gotten surgery on it 3x. PMH of Parkinson's with prior neck and back surgery. Assessment significant for gross bilat LE weakness, L>R hip tightness in hamstrings/hip flexors and glutes, and high fall risk based on his Berg Balance. Pt will benefit from PT to address these deficits to improve his safety with home and  community amb.   OBJECTIVE IMPAIRMENTS: Abnormal gait, decreased activity tolerance, decreased balance, decreased coordination, decreased endurance, decreased mobility, difficulty walking, decreased ROM, decreased strength, hypomobility, increased edema, increased fascial restrictions, increased muscle spasms, impaired flexibility, impaired tone, improper body mechanics, postural dysfunction, and pain.   ACTIVITY LIMITATIONS: carrying, lifting, standing, squatting, stairs, transfers, bathing, toileting, dressing, reach over head, hygiene/grooming, and locomotion level  PARTICIPATION LIMITATIONS: meal prep, cleaning, laundry, driving, shopping, community activity, and yard work  PERSONAL FACTORS: Age, Fitness, Past/current experiences, and Time since onset of injury/illness/exacerbation are also affecting patient's functional outcome.   REHAB POTENTIAL: Good  CLINICAL DECISION MAKING: Evolving/moderate complexity  EVALUATION COMPLEXITY: Moderate   GOALS: Goals reviewed with patient? Yes  SHORT TERM GOALS: Target date: 12/03/2023  Pt will be ind with initial HEP Baseline: Goal status: MET 11/20/23  2.  Pt will have improved 5x STS to </=13 sec to demo increased functional LE strength Baseline:  Goal status: INITIAL  LONG TERM GOALS: Target date: 12/31/2023   Pt will be ind with management and progression of HEP Baseline:  Goal status: INITIAL  2.  Pt will have improved Berg Balance Score to >/=45 to demo decreased fall risk Baseline:  Goal status: INITIAL  3.  Pt will be able to alternate tapping his feet on a step without R knee hyperextension throwing off his balance to demo improving LE Stability for safe stair and curb negotiation Baseline:  Goal status: INITIAL  4.  Pt will have improved LEFS score to >/=46/80 to demo MCID Baseline:  Goal status: INITIAL    PLAN:  PT FREQUENCY: 2x/week  PT DURATION: 8 weeks  PLANNED INTERVENTIONS: 97164- PT Re-evaluation,  97110-Therapeutic exercises, 97530- Therapeutic activity, 97112- Neuromuscular re-education, 97535- Self Care, 19147- Manual therapy, [phone]- Gait training, [phone]- Aquatic Therapy, [phone]- Electrical stimulation (unattended), [phone]- Ionotophoresis 4mg /ml Dexamethasone , Patient/Family education, Balance training, Stair training, Taping, Dry Needling, Joint mobilization, Spinal mobilization, Cryotherapy, and Moist heat  PLAN FOR NEXT SESSION: Assess response to HEP. Work on decreasing R knee hyperextension and reducing posterior LOBs. Gross bilat hip strengthening and core strengthening.    Carolynne Schuchard L Wren Pryce, PTA 11/20/2023, 8:53 AM

## 2023-12-08 ENCOUNTER — Ambulatory Visit: Attending: Student

## 2023-12-08 DIAGNOSIS — R293 Abnormal posture: Secondary | ICD-10-CM | POA: Diagnosis present

## 2023-12-08 DIAGNOSIS — R29818 Other symptoms and signs involving the nervous system: Secondary | ICD-10-CM | POA: Diagnosis present

## 2023-12-08 DIAGNOSIS — R2689 Other abnormalities of gait and mobility: Secondary | ICD-10-CM | POA: Insufficient documentation

## 2023-12-08 DIAGNOSIS — R2681 Unsteadiness on feet: Secondary | ICD-10-CM | POA: Insufficient documentation

## 2023-12-08 DIAGNOSIS — M6281 Muscle weakness (generalized): Secondary | ICD-10-CM | POA: Diagnosis present

## 2023-12-08 NOTE — Therapy (Signed)
 OUTPATIENT PHYSICAL THERAPY LOWER EXTREMITY TREATMENT   Patient Name: Jim Carson MRN: [id number] DOB:May 07, 1951, 73 y.o., male Today's Date: 12/08/2023  END OF SESSION:  PT End of Session - 12/08/23 1101     Visit Number 5    Authorization Type Medicare + AARP    PT Start Time 1016    PT Stop Time 1059    PT Time Calculation (min) 43 min    Activity Tolerance Patient tolerated treatment well    Behavior During Therapy WFL for tasks assessed/performed               Past Medical History:  Diagnosis Date   Abnormal gait    due to parkinson's,  uses cane   Absolute anemia 03/13/2023   Allergic rhinitis    Allergy Spring 1998   Allergic to dustmits, dog hair and roaches.   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 Geralyn Knee  @[facility]   Pneumonia    Sleep apnea    Vitamin B12 deficiency    Vitamin D  deficiency    Wears glasses    Past Surgical History:  Procedure Laterality Date   APPENDECTOMY  07/1964   APPENDECTOMY     CHOLECYSTECTOMY     JOINT REPLACEMENT  March 2019 and again on 09/10/2021   Left hip twice.  2nd time due to 3 dislocations   LAPAROSCOPIC CHOLECYSTECTOMY  01-21-2017   @[facility] , [place]   lower back surgery      POSTERIOR CERVICAL FUSION/FORAMINOTOMY     POSTERIOR FUSION CERVICAL SPINE  08-15-2011   @[facility] , [place]   w/ laminectomy and decompression-- C3 -- T1   POSTERIOR LAMINECTOMY / DECOMPRESSION LUMBAR SPINE  07-30-2013   @[facility] , [place]   bilateral T11 - 12,   L2 --5   REMOVAL LEFT T1 PARASPINOUS MASS  02-12-2016   @[facility] ,  [place]   desmoid fibromatosis   SHOULDER ARTHROSCOPY WITH ROTATOR CUFF REPAIR Left 12/15/2018   Procedure: SHOULDER ARTHROSCOPY, biceps tenodesis labored Debridement, submacromial decompression;  Surgeon: Genevie Kerns, MD;  Location: [facility];  Service: Orthopedics;  Laterality: Left;  interscalene block   SPINE SURGERY     TOTAL HIP ARTHROPLASTY Left 09/18/2017   @[facility] , [place]   TOTAL HIP REVISION Left 09/10/2021   Procedure: Left hip acetabular versus total hip arthroplasty revision, constrained liner;  Surgeon: Liliane Rei, MD;  Location: [facility];  Service: Orthopedics;  Laterality: Left;   TOTAL HIP REVISION Left 10/01/2023   Procedure: Left hip constrained liner revision;  Surgeon: Liliane Rei, MD;  Location: [facility];  Service: Orthopedics;  Laterality: Left;    Patient Active Problem List   Diagnosis Date Noted   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   Thrombocytopenia (HCC) 03/17/2023   Absolute anemia 03/13/2023   Family history of prostate cancer 03/13/2023   History of elevated PSA 03/13/2023   AV block, 1st degree  03/13/2023   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   Drug induced constipation 12/30/2022   Acquired hypothyroidism 12/30/2022   Primary osteoarthritis of left hip 05/18/2021   PD (Parkinson's disease) (HCC) 05/18/2021   History of gastroesophageal reflux (GERD) 05/18/2021   OSA (obstructive sleep apnea) 05/18/2021   Essential hypertension 05/18/2021    PCP: Louis Row, MD  REFERRING PROVIDER: Perla Bradford, PA  REFERRING DIAG: Z47.89: Encounter for other orthopedic aftercare S/P left hip constrained liner revision  THERAPY DIAG:  Muscle weakness (generalized)  Unsteadiness on feet  Other abnormalities of gait and mobility  Abnormal posture  Other symptoms and signs involving the nervous system  Rationale for Evaluation  and Treatment: Rehabilitation  ONSET DATE: 10/01/23 L hip revision  SUBJECTIVE:   SUBJECTIVE STATEMENT: Pt reports he fell in a restaurant on vacation, he closed his rollator too early and lost his balance.   Pt reports he had a L hip replacement in 2019 and dislocated it 3 times. Redid it 2023, had a fall and jammed it and had to revise it 10/01/23. Was told not to go too heavy over 110 lbs on the leg press. Was told not to cross his legs. Will be seeing ortho 11/11/23 and will further clarify precautions. Pt feels his greatest issue is strength. Has not yet returned to the gym. Has been walking some but wants to walk more. Plans to do water  aerobics. Uses his 4 wheel walker at baseline.  PERTINENT HISTORY: Parkinson's (Last saw PT for this in July 2024), back problems (surgery in neck and low back), R ankle rolls  PAIN:  Are you having pain? No  PRECAUTIONS: Fall  RED FLAGS: None   WEIGHT BEARING RESTRICTIONS: No  FALLS:  Has patient fallen in last 6 months? Yes. Number of falls 3 -- last fall yesterday getting pizza out of the stove  LIVING ENVIRONMENT: Lives with: lives with their spouse and daughter Lives in: House/apartment Stairs: Yes: Internal: 14 steps; can reach both Has following equipment at home: Otho Blitz - 4 wheeled  OCCUPATION: Retired -- reads, tries to exercise, watches TV  PLOF: Independent  PATIENT GOALS: Return to exercise and walking  NEXT MD VISIT: n/a  OBJECTIVE:  Note: Objective measures were completed at Evaluation unless otherwise noted.  DIAGNOSTIC FINDINGS: x-ray on R foot and ankle 03/25/22: Ankle joint space maintained. Elevated first ray. Midfoot arthritic changes present.   PATIENT SURVEYS:  LEFS: 37/80 = 46.25%  COGNITION: Overall cognitive status: Within functional limits for tasks assessed     SENSATION: WFL  EDEMA:  None now  MUSCLE LENGTH: Hamstrings: Right ~80 deg; Left ~70 deg Thomas test: Right 0 deg; Left 0 deg  POSTURE:  rounded shoulders, forward head, increased thoracic kyphosis, and flexed trunk , R knee hyperextends and comes into varus, L knee goes into valgus  PALPATION: No overt tenderness to palpation  LOWER EXTREMITY ROM:  Active ROM Right eval Left eval  Hip flexion    Hip extension    Hip abduction    Hip adduction    Hip internal rotation    Hip external rotation    Knee flexion    Knee extension    Ankle dorsiflexion    Ankle plantarflexion    Ankle inversion    Ankle eversion     (Blank rows = not tested)  LOWER EXTREMITY MMT:  MMT Right eval Left eval  Hip flexion 5 5  Hip extension 3+ 3+  Hip abduction 3 3  Hip adduction    Hip internal rotation    Hip external rotation    Knee flexion 5 4  Knee extension 5 5  Ankle dorsiflexion    Ankle plantarflexion    Ankle inversion    Ankle eversion     (Blank rows = not tested)  LOWER EXTREMITY SPECIAL TESTS:  Did not assess  FUNCTIONAL TESTS:  5 times sit to stand: 15.1 sec without UEs Timed up and go (TUG): 13.77 sec Berg Balance Scale: 39/56     GAIT: Distance walked: Into clinic Assistive device utilized: Walker - 4 wheeled Level of assistance: SBA Comments: R knee hyperextension during stance, increased forward lean, scissors with R LE, decreased heel strike                                                                                                                                TREATMENT DATE:  12/08/23 Nustep L5 x 6 min 5xSTS Heel/toe raises x 20 Retro step x 20 RLE behind Standing LLE toe tap lateral and posterior  x20 Mini squat at counter 10x3"; 2 sets Leg curls 20lb 2x10 BLE Leg ext 20lb 2x10 BLE   11/20/23 Nustep L5 x 6 min Standing hip abduction, extension, marching RTB at thighs 2x10 Retro step x 15 Church pews x 15 Seated LAQ 3lb x 10 BLE Seated rows RTB x 20 Seated shoulder RTB x 15  11/12/23 Nustep L5 x 6 min Seated PWR! up red TB around thighs 2x10 Seated sit<>stand red TB  around thighs focus on glute set 2x10 Standing PWR! Step back red TB around thighs 2x10 Standing PWR! Side step red TB around thighs 2x10 Standing terminal knee ext red TB focusing on eccentric control to decrease knee hyperextension 3x10 Eccentric sit to stand x10 Amb with rollator, focusing on hip extension and diminishing knee hyperextension with improved heel strike noted x 1 lap around gym  11/07/23 Nustep level 5 x 6 minutes Seated LAQ 4# 2x10 each leg Standing with walker 4# marches 2x10 4# abduction 4# extension Ball b/n knees seated squeeze Green tband clamshells Supine feet on ball K2C, rotation, small bridge, isometric abs Small weighted ball 4.4# STS then sit to stand with reach Back to wall small butt touches to wall to get COG  11/05/23  See HEP below   PATIENT EDUCATION:  Education details: Exam findings, POC, initial HEP Person educated: Patient Education method: Explanation, Demonstration, and Handouts Education comprehension: verbalized understanding, returned demonstration, and needs further education  HOME EXERCISE PROGRAM: Access Code: [email or web address] URL: [email or web address] Date: 11/20/2023 Prepared by: Elanah Osmanovic  Exercises - Seated Hip Abduction with Resistance  - 1 x daily - 7 x weekly - 2 sets - 10 reps - Seated March with Resistance  - 1 x daily - 7 x weekly - 2 sets - 10 reps - Seated Gluteal Sets  - 1 x daily - 7 x weekly - 1 sets - 10 reps -  3 sec hold - Sit to Stand with Resistance Around Legs  - 1 x daily - 7 x weekly - 2 sets - 10 reps - Standing Hip Abduction with Resistance at Ankles and Counter Support  - 1 x daily - 7 x weekly - 2 sets - 10 reps - Standing Hip Extension with Resistance at Ankles and Counter Support  - 1 x daily - 7 x weekly - 2 sets - 10 reps  ASSESSMENT:  CLINICAL IMPRESSION: Progressed patient through strengthening interventions to improve functional performance and muscular co-contractions to improve  stability. Introduced weight machines for bulk strengthening of quads and hamstrings. Cues needed to avoid hyperextension of R knee when standing and cues to go slow with exercises and eccentric control.  Patient is a 73 y.o. M who was seen today for physical therapy evaluation and treatment s/p L hip constrained liner revision (10/01/23). Pt states his L hip has been dislocated 3x and he has gotten surgery on it 3x. PMH of Parkinson's with prior neck and back surgery. Assessment significant for gross bilat LE weakness, L>R hip tightness in hamstrings/hip flexors and glutes, and high fall risk based on his Berg Balance. Pt will benefit from PT to address these deficits to improve his safety with home and community amb.   OBJECTIVE IMPAIRMENTS: Abnormal gait, decreased activity tolerance, decreased balance, decreased coordination, decreased endurance, decreased mobility, difficulty walking, decreased ROM, decreased strength, hypomobility, increased edema, increased fascial restrictions, increased muscle spasms, impaired flexibility, impaired tone, improper body mechanics, postural dysfunction, and pain.   ACTIVITY LIMITATIONS: carrying, lifting, standing, squatting, stairs, transfers, bathing, toileting, dressing, reach over head, hygiene/grooming, and locomotion level  PARTICIPATION LIMITATIONS: meal prep, cleaning, laundry, driving, shopping, community activity, and yard work  PERSONAL FACTORS: Age, Fitness, Past/current experiences, and Time since onset of injury/illness/exacerbation are also affecting patient's functional outcome.   REHAB POTENTIAL: Good  CLINICAL DECISION MAKING: Evolving/moderate complexity  EVALUATION COMPLEXITY: Moderate   GOALS: Goals reviewed with patient? Yes  SHORT TERM GOALS: Target date: 12/03/2023  Pt will be ind with initial HEP Baseline: Goal status: MET 11/20/23  2.  Pt will have improved 5x STS to </=13 sec to demo increased functional LE strength Baseline:   Goal status: - PROGRESSING 22 seconds 12/08/23  LONG TERM GOALS: Target date: 12/31/2023   Pt will be ind with management and progression of HEP Baseline:  Goal status: INITIAL  2.  Pt will have improved Berg Balance Score to >/=45 to demo decreased fall risk Baseline:  Goal status: INITIAL  3.  Pt will be able to alternate tapping his feet on a step without R knee hyperextension throwing off his balance to demo improving LE Stability for safe stair and curb negotiation Baseline:  Goal status: INITIAL  4.  Pt will have improved LEFS score to >/=46/80 to demo MCID Baseline:  Goal status: INITIAL    PLAN:  PT FREQUENCY: 2x/week  PT DURATION: 8 weeks  PLANNED INTERVENTIONS: [id number]- PT Re-evaluation, 97110-Therapeutic exercises, 97530- Therapeutic activity, 97112- Neuromuscular re-education, 97535- Self Care, [id number]- Manual therapy, [phone]- Gait training, [phone]- Aquatic Therapy, [phone]- Electrical stimulation (unattended), [phone]- Ionotophoresis 4mg /ml Dexamethasone , Patient/Family education, Balance training, Stair training, Taping, Dry Needling, Joint mobilization, Spinal mobilization, Cryotherapy, and Moist heat  PLAN FOR NEXT SESSION: Assess response to HEP. Work on decreasing R knee hyperextension and reducing posterior LOBs. Gross bilat hip strengthening and core strengthening.    Tatum Corl L Kieon Lawhorn, PTA 12/08/2023, 11:02 AM

## 2023-12-10 ENCOUNTER — Encounter

## 2023-12-15 ENCOUNTER — Ambulatory Visit: Admitting: Physical Therapy

## 2023-12-15 DIAGNOSIS — M6281 Muscle weakness (generalized): Secondary | ICD-10-CM

## 2023-12-15 DIAGNOSIS — R29818 Other symptoms and signs involving the nervous system: Secondary | ICD-10-CM

## 2023-12-15 DIAGNOSIS — R293 Abnormal posture: Secondary | ICD-10-CM

## 2023-12-15 DIAGNOSIS — R2689 Other abnormalities of gait and mobility: Secondary | ICD-10-CM

## 2023-12-15 DIAGNOSIS — R2681 Unsteadiness on feet: Secondary | ICD-10-CM

## 2023-12-15 NOTE — Therapy (Signed)
 OUTPATIENT PHYSICAL THERAPY LOWER EXTREMITY TREATMENT   Patient Name: Jim Carson MRN: [id number] DOB:1950-10-15, 73 y.o., male Today's Date: 12/15/2023  END OF SESSION:  PT End of Session - 12/15/23 1101     Visit Number 6    Authorization Type Medicare + [employer]    PT Start Time 1101    PT Stop Time 1140    PT Time Calculation (min) 39 min    Activity Tolerance Patient tolerated treatment well    Behavior During Therapy WFL for tasks assessed/performed          Past Medical History:  Diagnosis Date   Abnormal gait    due to parkinson's,  uses cane   Absolute anemia 03/13/2023   Allergic rhinitis    Allergy Spring 1998   Allergic to dustmits, dog hair and roaches.   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 Geralyn Knee  @[facility]   Pneumonia    Sleep apnea    Vitamin B12 deficiency    Vitamin D  deficiency    Wears glasses    Past Surgical History:  Procedure Laterality Date   APPENDECTOMY  07/1964   APPENDECTOMY     CHOLECYSTECTOMY     JOINT REPLACEMENT  March 2019 and again on 09/10/2021   Left hip twice.  2nd time due to 3 dislocations   LAPAROSCOPIC CHOLECYSTECTOMY  01-21-2017   @[facility] , [place]   lower back surgery      POSTERIOR CERVICAL FUSION/FORAMINOTOMY     POSTERIOR FUSION CERVICAL SPINE  08-15-2011   @[facility] , [place]   w/ laminectomy and decompression-- C3 -- T1   POSTERIOR LAMINECTOMY / DECOMPRESSION LUMBAR SPINE  07-30-2013   @[facility] ,    bilateral T11 - 12,  L2 --5    REMOVAL LEFT T1 PARASPINOUS MASS  02-12-2016   @[facility] ,  [place]   desmoid fibromatosis   SHOULDER ARTHROSCOPY WITH ROTATOR CUFF REPAIR Left 12/15/2018   Procedure: SHOULDER ARTHROSCOPY, biceps tenodesis labored Debridement, submacromial decompression;  Surgeon: Genevie Kerns, MD;  Location: [facility];  Service: Orthopedics;  Laterality: Left;  interscalene block   SPINE SURGERY     TOTAL HIP ARTHROPLASTY Left 09/18/2017   @[facility] , [place]   TOTAL HIP REVISION Left 09/10/2021   Procedure: Left hip acetabular versus total hip arthroplasty revision, constrained liner;  Surgeon: Liliane Rei, MD;  Location: [facility];  Service: Orthopedics;  Laterality: Left;   TOTAL HIP REVISION Left 10/01/2023   Procedure: Left hip constrained liner revision;  Surgeon: Liliane Rei, MD;  Location: [facility];  Service: Orthopedics;  Laterality: Left;    Patient Active Problem List   Diagnosis Date Noted   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   Thrombocytopenia (HCC) 03/17/2023   Absolute anemia 03/13/2023   Family history of prostate cancer 03/13/2023   History of elevated PSA 03/13/2023   AV block, 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  Rationale for Evaluation and Treatment: Rehabilitation  ONSET DATE: 10/01/23 L hip revision  SUBJECTIVE:   SUBJECTIVE STATEMENT: Pt reports no other falls. Pt states he will be seeing  ortho this week.   Pt reports he had a L hip replacement in 2019 and dislocated it 3 times. Redid it 2023, had a fall and jammed it and had to revise it 10/01/23. Was told not to go too heavy over 110 lbs on the leg press. Was told not to cross his legs. Will be seeing ortho 11/11/23 and will further clarify precautions. Pt feels his greatest issue is strength. Has not yet returned to the gym. Has been walking some but wants to walk more. Plans to do water  aerobics. Uses his 4 wheel walker at baseline.  PERTINENT HISTORY: Parkinson's (Last saw PT for this in July 2024), back problems (surgery in neck and low back), R ankle rolls  PAIN:  Are you having pain? No  PRECAUTIONS: Fall  RED FLAGS: None   WEIGHT BEARING RESTRICTIONS: No  FALLS:  Has patient fallen in last 6 months? Yes. Number of falls 3 -- last fall yesterday getting pizza out of the stove  LIVING ENVIRONMENT: Lives with: lives with their spouse and daughter Lives in: House/apartment Stairs: Yes: Internal: 14 steps; can reach both Has following equipment at home: Otho Blitz - 4 wheeled  OCCUPATION: Retired -- reads, tries to exercise, watches TV  PLOF: Independent  PATIENT GOALS: Return to exercise and walking  NEXT MD VISIT: 12/18/23  OBJECTIVE:  Note: Objective measures were completed at Evaluation unless otherwise noted.  DIAGNOSTIC FINDINGS: x-ray on R foot and ankle 03/25/22: Ankle joint space maintained. Elevated first ray. Midfoot arthritic changes present.   PATIENT SURVEYS:  LEFS: 37/80 = 46.25% EDEMA:  None now  MUSCLE LENGTH: Hamstrings: Right ~80 deg; Left ~70 deg Thomas test: Right 0 deg; Left 0 deg  POSTURE: rounded shoulders, forward head, increased thoracic kyphosis, and flexed trunk , R knee hyperextends and comes into varus, L knee goes into valgus  PALPATION: No overt tenderness to palpation   LOWER EXTREMITY MMT:  MMT Right eval Left eval  Hip flexion 5 5  Hip extension 3+ 3+  Hip  abduction 3 3  Hip adduction    Hip internal rotation    Hip external rotation    Knee flexion 5 4  Knee extension 5 5  Ankle dorsiflexion    Ankle plantarflexion    Ankle inversion    Ankle eversion     (Blank rows = not tested)  LOWER EXTREMITY SPECIAL TESTS:  Did not assess  FUNCTIONAL TESTS:  5 times sit to stand: 15.1 sec without UEs Timed up and go (TUG): 13.77 sec Berg Balance Scale: 39/56     GAIT: Distance walked: Into clinic Assistive device utilized: Walker - 4 wheeled Level of assistance: SBA Comments: R knee hyperextension during stance, increased forward lean, scissors with R LE, decreased heel strike  TREATMENT DATE:  12/15/23 Nustep L7 x 8 min Supine bridge 2x10 Supine hip abd 2x10 Sidelying clamshell red TB 2x10 Seated knee flex/ext red TB 2x10 Seated ankle DF red TB 2x10 Staggered stance glute setting 2x10 Staggered stance static balance with UE support 2x30 Amb x1 lap around gym with 4 wheel walker, focusing on hip extension and keeping chest up and diminishing knee hyperextension   12/08/23 Nustep L5 x 6 min 5xSTS Heel/toe raises x 20 Retro step x 20 RLE behind Standing LLE toe tap lateral and posterior  x20 Mini squat at counter 10x3; 2 sets Leg curls 20lb 2x10 BLE Leg ext 20lb 2x10 BLE   11/20/23 Nustep L5 x 6 min Standing hip abduction, extension, marching RTB at thighs 2x10 Retro step x 15 Church pews x 15 Seated LAQ 3lb x 10 BLE Seated rows RTB x 20 Seated shoulder RTB x 15  11/12/23 Nustep L5 x 6 min Seated PWR! up red TB around thighs 2x10 Seated sit<>stand red TB around thighs focus on glute set 2x10 Standing PWR! Step back red TB around thighs 2x10 Standing PWR! Side step red TB around thighs 2x10 Standing terminal knee ext red TB focusing on eccentric control to decrease knee hyperextension  3x10 Eccentric sit to stand x10 Amb with rollator, focusing on hip extension and diminishing knee hyperextension with improved heel strike noted x 1 lap around gym  11/07/23 Nustep level 5 x 6 minutes Seated LAQ 4# 2x10 each leg Standing with walker 4# marches 2x10 4# abduction 4# extension Ball b/n knees seated squeeze Green tband clamshells Supine feet on ball K2C, rotation, small bridge, isometric abs Small weighted ball 4.4# STS then sit to stand with reach Back to wall small butt touches to wall to get COG  11/05/23  See HEP below   PATIENT EDUCATION:  Education details: Exam findings, POC, initial HEP Person educated: Patient Education method: Explanation, Demonstration, and Handouts Education comprehension: verbalized understanding, returned demonstration, and needs further education  HOME EXERCISE PROGRAM: Access Code: [email or web address] URL: [email or web address] Date: 11/20/2023 Prepared by: Braylin Clark  Exercises - Seated Hip Abduction with Resistance  - 1 x daily - 7 x weekly - 2 sets - 10 reps - Seated March with Resistance  - 1 x daily - 7 x weekly - 2 sets - 10 reps - Seated Gluteal Sets  - 1 x daily - 7 x weekly - 1 sets - 10 reps - 3 sec hold - Sit to Stand with Resistance Around Legs  - 1 x daily - 7 x weekly - 2 sets - 10 reps - Standing Hip Abduction with Resistance at Ankles and Counter Support  - 1 x daily - 7 x weekly - 2 sets - 10 reps - Standing Hip Extension with Resistance at Ankles and Counter Support  - 1 x daily - 7 x weekly - 2 sets - 10 reps  ASSESSMENT:  CLINICAL IMPRESSION: Continued hip/gross LE strengthening. Working on improving glute activation with standing posture and amb.   Patient is a 73 y.o. M who was seen today for physical therapy evaluation and treatment s/p L hip constrained liner revision (10/01/23). Pt states his L hip has been dislocated 3x and he has gotten surgery on it 3x. PMH of Parkinson's with prior neck and back  surgery. Assessment significant for gross bilat LE weakness, L>R hip tightness in hamstrings/hip flexors and glutes, and high fall risk based on his Berg Balance. Pt will benefit from PT to address these deficits  to improve his safety with home and community amb.   OBJECTIVE IMPAIRMENTS: Abnormal gait, decreased activity tolerance, decreased balance, decreased coordination, decreased endurance, decreased mobility, difficulty walking, decreased ROM, decreased strength, hypomobility, increased edema, increased fascial restrictions, increased muscle spasms, impaired flexibility, impaired tone, improper body mechanics, postural dysfunction, and pain.     GOALS: Goals reviewed with patient? Yes  SHORT TERM GOALS: Target date: 12/03/2023  Pt will be ind with initial HEP Baseline: Goal status: MET 11/20/23  2.  Pt will have improved 5x STS to </=13 sec to demo increased functional LE strength Baseline:  Goal status: - PROGRESSING 22 seconds 12/08/23  LONG TERM GOALS: Target date: 12/31/2023   Pt will be ind with management and progression of HEP Baseline:  Goal status: INITIAL  2.  Pt will have improved Berg Balance Score to >/=45 to demo decreased fall risk Baseline:  Goal status: INITIAL  3.  Pt will be able to alternate tapping his feet on a step without R knee hyperextension throwing off his balance to demo improving LE Stability for safe stair and curb negotiation Baseline:  Goal status: INITIAL  4.  Pt will have improved LEFS score to >/=46/80 to demo MCID Baseline:  Goal status: INITIAL    PLAN:  PT FREQUENCY: 2x/week  PT DURATION: 8 weeks  PLANNED INTERVENTIONS: [id number]- PT Re-evaluation, 97110-Therapeutic exercises, 97530- Therapeutic activity, 97112- Neuromuscular re-education, 97535- Self Care, [id number]- Manual therapy, [phone]- Gait training, [phone]- Aquatic Therapy, [phone]- Electrical stimulation (unattended), [phone]- Ionotophoresis 4mg /ml Dexamethasone , Patient/Family education,  Balance training, Stair training, Taping, Dry Needling, Joint mobilization, Spinal mobilization, Cryotherapy, and Moist heat  PLAN FOR NEXT SESSION: Assess response to HEP. Work on decreasing R knee hyperextension and reducing posterior LOBs. Gross bilat hip strengthening and core strengthening.    Saray Capasso April Ma L Macsen Nuttall, PT, DPT 12/15/2023, 11:01 AM

## 2023-12-18 ENCOUNTER — Ambulatory Visit

## 2023-12-18 DIAGNOSIS — R2681 Unsteadiness on feet: Secondary | ICD-10-CM

## 2023-12-18 DIAGNOSIS — R2689 Other abnormalities of gait and mobility: Secondary | ICD-10-CM

## 2023-12-18 DIAGNOSIS — R29818 Other symptoms and signs involving the nervous system: Secondary | ICD-10-CM

## 2023-12-18 DIAGNOSIS — M6281 Muscle weakness (generalized): Secondary | ICD-10-CM | POA: Diagnosis not present

## 2023-12-18 DIAGNOSIS — R293 Abnormal posture: Secondary | ICD-10-CM

## 2023-12-18 NOTE — Therapy (Signed)
 OUTPATIENT PHYSICAL THERAPY LOWER EXTREMITY TREATMENT   Patient Name: Jim Carson MRN: [id number] DOB:Nov 21, 1950, 73 y.o., male Today's Date: 12/18/2023  END OF SESSION:  PT End of Session - 12/18/23 1144     Visit Number 7    Authorization Type Medicare + AARP    PT Start Time 1102    PT Stop Time 1144    PT Time Calculation (min) 42 min    Activity Tolerance Patient tolerated treatment well    Behavior During Therapy WFL for tasks assessed/performed           Past Medical History:  Diagnosis Date   Abnormal gait    due to parkinson's,  uses cane   Absolute anemia 03/13/2023   Allergic rhinitis    Allergy Spring 1998   Allergic to dustmits, dog hair and roaches.   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 Geralyn Knee  @[facility]  [facility]urology   Pneumonia    Sleep apnea    Vitamin B12 deficiency    Vitamin D  deficiency    Wears glasses    Past Surgical History:  Procedure Laterality Date   APPENDECTOMY  07/1964   APPENDECTOMY     CHOLECYSTECTOMY     JOINT REPLACEMENT  March 2019 and again on 09/10/2021   Left hip twice.  2nd time due to 3 dislocations   LAPAROSCOPIC CHOLECYSTECTOMY  01-21-2017   @[facility] , [place]   lower back surgery      POSTERIOR CERVICAL FUSION/FORAMINOTOMY     POSTERIOR FUSION CERVICAL SPINE  08-15-2011   @[facility] , [place]   w/ laminectomy and decompression-- C3 -- T1   POSTERIOR LAMINECTOMY / DECOMPRESSION LUMBAR SPINE  07-30-2013   @[facility] , [place]   bilateral T11 - 12,  L2 --5    REMOVAL LEFT T1 PARASPINOUS MASS  02-12-2016   @[facility] ,  [place]   desmoid fibromatosis   SHOULDER ARTHROSCOPY WITH ROTATOR CUFF REPAIR Left 12/15/2018   Procedure: SHOULDER ARTHROSCOPY, biceps tenodesis labored Debridement, submacromial decompression;  Surgeon: Genevie Kerns, MD;  Location: [facility];  Service: Orthopedics;  Laterality: Left;  interscalene block   SPINE SURGERY     TOTAL HIP ARTHROPLASTY Left 09/18/2017   @[facility] , [place]   TOTAL HIP REVISION Left 09/10/2021   Procedure: Left hip acetabular versus total hip arthroplasty revision, constrained liner;  Surgeon: Liliane Rei, MD;  Location: [facility];  Service: Orthopedics;  Laterality: Left;   TOTAL HIP REVISION Left 10/01/2023   Procedure: Left hip constrained liner revision;  Surgeon: Liliane Rei, MD;  Location: [facility];  Service: Orthopedics;  Laterality: Left;    Patient Active Problem List   Diagnosis Date Noted   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   Thrombocytopenia (HCC) 03/17/2023   Absolute anemia 03/13/2023   Family history of prostate cancer 03/13/2023   History of elevated PSA 03/13/2023   AV block, 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   Drug induced constipation 12/30/2022   Acquired hypothyroidism 12/30/2022   Primary osteoarthritis of left hip 05/18/2021   PD (Parkinson's disease) (HCC) 05/18/2021   History of gastroesophageal reflux (GERD) 05/18/2021   OSA (obstructive sleep apnea) 05/18/2021   Essential hypertension 05/18/2021    PCP: Louis Row, MD  REFERRING PROVIDER: Perla Bradford, PA  REFERRING DIAG: [phone]: Encounter for other orthopedic aftercare S/P left hip constrained liner revision  THERAPY DIAG:  Muscle weakness (generalized)  Unsteadiness on feet  Other abnormalities of gait and mobility  Abnormal posture  Other symptoms and signs involving the nervous system  Rationale for Evaluation and  Treatment: Rehabilitation  ONSET DATE: 10/01/23 L hip revision  SUBJECTIVE:   SUBJECTIVE STATEMENT: Pt with good reports from ortho doctor, goes back in a year.  Pt reports he had a L hip replacement in 2019 and dislocated it 3 times. Redid it 2023, had a fall and jammed it and had to revise it 10/01/23. Was told not to go too heavy over 110 lbs on the leg press. Was told not to cross his legs. Will be seeing ortho 11/11/23 and will further clarify precautions. Pt feels his greatest issue is strength. Has not yet returned to the gym. Has been walking some but wants to walk more. Plans to do water  aerobics. Uses his 4 wheel walker at baseline.  PERTINENT HISTORY: Parkinson's (Last saw PT for this in July 2024), back problems (surgery in neck and low back), R ankle rolls  PAIN:  Are you having pain? No  PRECAUTIONS: Fall  RED FLAGS: None   WEIGHT BEARING RESTRICTIONS: No  FALLS:  Has patient fallen in last 6 months? Yes. Number of falls 3 -- last fall yesterday getting pizza out of the stove  LIVING ENVIRONMENT: Lives with: lives with their spouse and daughter Lives in: House/apartment Stairs: Yes: Internal: 14 steps; can reach both Has following equipment at home: Otho Blitz - 4 wheeled  OCCUPATION: Retired -- reads, tries to exercise, watches TV  PLOF: Independent  PATIENT GOALS: Return to exercise and walking  NEXT MD VISIT: 12/18/23  OBJECTIVE:  Note: Objective measures were completed at Evaluation unless otherwise noted.  DIAGNOSTIC FINDINGS: x-ray on R foot and ankle 03/25/22: Ankle joint space maintained. Elevated first ray. Midfoot arthritic changes present.   PATIENT SURVEYS:  LEFS: 37/80 = 46.25% EDEMA:  None now  MUSCLE LENGTH: Hamstrings: Right ~80 deg; Left ~70 deg Thomas test: Right 0 deg; Left 0 deg  POSTURE: rounded shoulders, forward head, increased thoracic kyphosis, and flexed trunk , R knee hyperextends and comes into varus, L knee goes into  valgus  PALPATION: No overt tenderness to palpation   LOWER EXTREMITY MMT:  MMT Right eval Left eval  Hip flexion 5 5  Hip extension 3+ 3+  Hip abduction 3 3  Hip adduction    Hip internal rotation    Hip external rotation    Knee flexion 5 4  Knee extension 5 5  Ankle dorsiflexion    Ankle plantarflexion    Ankle inversion    Ankle eversion     (Blank rows = not tested)  LOWER EXTREMITY SPECIAL TESTS:  Did not assess  FUNCTIONAL TESTS:  5 times sit to stand: 15.1 sec without UEs Timed up and go (TUG): 13.77 sec Berg Balance Scale: 39/56     GAIT: Distance walked: Into clinic Assistive device utilized: Walker - 4 wheeled Level of assistance: SBA Comments: R knee  hyperextension during stance, increased forward lean, scissors with R LE, decreased heel strike                                                                                                                                TREATMENT DATE:  12/18/23 Nustep L5 x 6 min Supine bridge 2x10 Upper trunk twist x 10 B SLR 2x10 B Supine hip ADD + ab brace 10x5; 2 set Standing glute sets Standing quad sets/TKE  Gait training - cues for heel strike, increased stride slength, and upright posture 12/15/23 Nustep L7 x 8 min Supine bridge 2x10 Supine hip abd 2x10 Sidelying clamshell red TB 2x10 Seated knee flex/ext red TB 2x10 Seated ankle DF red TB 2x10 Staggered stance glute setting 2x10 Staggered stance static balance with UE support 2x30 Amb x1 lap around gym with 4 wheel walker, focusing on hip extension and keeping chest up and diminishing knee hyperextension   12/08/23 Nustep L5 x 6 min 5xSTS Heel/toe raises x 20 Retro step x 20 RLE behind Standing LLE toe tap lateral and posterior  x20 Mini squat at counter 10x3; 2 sets Leg curls 20lb 2x10 BLE Leg ext 20lb 2x10 BLE   11/20/23 Nustep L5 x 6 min Standing hip abduction, extension, marching RTB at thighs 2x10 Retro step x 15 Church pews x  15 Seated LAQ 3lb x 10 BLE Seated rows RTB x 20 Seated shoulder RTB x 15  11/12/23 Nustep L5 x 6 min Seated PWR! up red TB around thighs 2x10 Seated sit<>stand red TB around thighs focus on glute set 2x10 Standing PWR! Step back red TB around thighs 2x10 Standing PWR! Side step red TB around thighs 2x10 Standing terminal knee ext red TB focusing on eccentric control to decrease knee hyperextension 3x10 Eccentric sit to stand x10 Amb with rollator, focusing on hip extension and diminishing knee hyperextension with improved heel strike noted x 1 lap around gym  11/07/23 Nustep level 5 x 6 minutes Seated LAQ 4# 2x10 each leg Standing with walker 4# marches 2x10 4# abduction 4# extension Ball b/n knees seated squeeze Green tband clamshells Supine feet on ball K2C, rotation, small bridge, isometric abs Small weighted ball 4.4# STS then sit to stand with reach Back to wall small butt touches to wall to get COG  11/05/23  See HEP below   PATIENT EDUCATION:  Education details: Exam findings, POC, initial HEP Person educated: Patient Education method: Explanation, Demonstration, and Handouts Education comprehension: verbalized understanding, returned demonstration, and needs further education  HOME EXERCISE PROGRAM: Access Code: [email or web address] URL: [email or web address] Date: 11/20/2023 Prepared by: Sujey Gundry  Exercises - Seated Hip Abduction with Resistance  - 1 x daily - 7 x weekly - 2 sets - 10 reps - Seated March with Resistance  - 1 x daily - 7 x weekly - 2 sets - 10 reps - Seated Gluteal Sets  - 1 x daily - 7 x weekly - 1  sets - 10 reps - 3 sec hold - Sit to Stand with Resistance Around Legs  - 1 x daily - 7 x weekly - 2 sets - 10 reps - Standing Hip Abduction with Resistance at Ankles and Counter Support  - 1 x daily - 7 x weekly - 2 sets - 10 reps - Standing Hip Extension with Resistance at Ankles and Counter Support  - 1 x daily - 7 x weekly - 2 sets - 10  reps  ASSESSMENT:  CLINICAL IMPRESSION: Continued working on core strength, mobility, and gait training. Pt continues to drag feet when walking with R genu recurvatum and decreased hip extension. Cues provided throughout session to correct form.   Patient is a 73 y.o. M who was seen today for physical therapy evaluation and treatment s/p L hip constrained liner revision (10/01/23). Pt states his L hip has been dislocated 3x and he has gotten surgery on it 3x. PMH of Parkinson's with prior neck and back surgery. Assessment significant for gross bilat LE weakness, L>R hip tightness in hamstrings/hip flexors and glutes, and high fall risk based on his Berg Balance. Pt will benefit from PT to address these deficits to improve his safety with home and community amb.   OBJECTIVE IMPAIRMENTS: Abnormal gait, decreased activity tolerance, decreased balance, decreased coordination, decreased endurance, decreased mobility, difficulty walking, decreased ROM, decreased strength, hypomobility, increased edema, increased fascial restrictions, increased muscle spasms, impaired flexibility, impaired tone, improper body mechanics, postural dysfunction, and pain.     GOALS: Goals reviewed with patient? Yes  SHORT TERM GOALS: Target date: 12/03/2023  Pt will be ind with initial HEP Baseline: Goal status: MET 11/20/23  2.  Pt will have improved 5x STS to </=13 sec to demo increased functional LE strength Baseline:  Goal status: - PROGRESSING 22 seconds 12/08/23  LONG TERM GOALS: Target date: 12/31/2023   Pt will be ind with management and progression of HEP Baseline:  Goal status: INITIAL  2.  Pt will have improved Berg Balance Score to >/=45 to demo decreased fall risk Baseline:  Goal status: INITIAL  3.  Pt will be able to alternate tapping his feet on a step without R knee hyperextension throwing off his balance to demo improving LE Stability for safe stair and curb negotiation Baseline:  Goal status:  INITIAL  4.  Pt will have improved LEFS score to >/=46/80 to demo MCID Baseline:  Goal status: INITIAL    PLAN:  PT FREQUENCY: 2x/week  PT DURATION: 8 weeks  PLANNED INTERVENTIONS: [id number]- PT Re-evaluation, 97110-Therapeutic exercises, 97530- Therapeutic activity, 97112- Neuromuscular re-education, 97535- Self Care, [id number]- Manual therapy, [phone]- Gait training, [phone]- Aquatic Therapy, [phone]- Electrical stimulation (unattended), [phone]- Ionotophoresis 4mg /ml Dexamethasone , Patient/Family education, Balance training, Stair training, Taping, Dry Needling, Joint mobilization, Spinal mobilization, Cryotherapy, and Moist heat  PLAN FOR NEXT SESSION: Assess response to HEP. Work on decreasing R knee hyperextension and reducing posterior LOBs. Gross bilat hip strengthening and core strengthening.    Winfrey Chillemi L Mikaelah Trostle, PTA 12/18/2023, 11:44 AM

## 2023-12-22 ENCOUNTER — Ambulatory Visit: Admitting: Physical Therapy

## 2023-12-22 DIAGNOSIS — R2689 Other abnormalities of gait and mobility: Secondary | ICD-10-CM

## 2023-12-22 DIAGNOSIS — R29818 Other symptoms and signs involving the nervous system: Secondary | ICD-10-CM

## 2023-12-22 DIAGNOSIS — R293 Abnormal posture: Secondary | ICD-10-CM

## 2023-12-22 DIAGNOSIS — M6281 Muscle weakness (generalized): Secondary | ICD-10-CM | POA: Diagnosis not present

## 2023-12-22 DIAGNOSIS — R2681 Unsteadiness on feet: Secondary | ICD-10-CM

## 2023-12-22 NOTE — Therapy (Signed)
 OUTPATIENT PHYSICAL THERAPY LOWER EXTREMITY TREATMENT   Patient Name: Jim Carson MRN: [id number] DOB:1951-04-28, 73 y.o., male Today's Date: 12/22/2023  END OF SESSION:  PT End of Session - 12/22/23 1110     Visit Number 8    Authorization Type Medicare + AARP    PT Start Time 1101    PT Stop Time 1140    PT Time Calculation (min) 39 min    Activity Tolerance Patient tolerated treatment well    Behavior During Therapy WFL for tasks assessed/performed           Past Medical History:  Diagnosis Date   Abnormal gait    due to parkinson's,  uses cane   Absolute anemia 03/13/2023   Allergic rhinitis    Allergy Spring 1998   Allergic to dustmits, dog hair and roaches.   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 glendia  @[facility]   Pneumonia    Sleep apnea    Vitamin B12 deficiency    Vitamin D  deficiency    Wears glasses    Past Surgical History:  Procedure Laterality Date   APPENDECTOMY  07/1964   APPENDECTOMY     CHOLECYSTECTOMY     JOINT REPLACEMENT  March 2019 and again on 09/10/2021   Left hip twice.  2nd time due to 3 dislocations   LAPAROSCOPIC CHOLECYSTECTOMY  01-21-2017   @[facility] , [place]   lower back surgery      POSTERIOR CERVICAL FUSION/FORAMINOTOMY     POSTERIOR FUSION CERVICAL SPINE  08-15-2011   @[facility] , [place]   w/ laminectomy and decompression-- C3 -- T1   POSTERIOR LAMINECTOMY / DECOMPRESSION LUMBAR SPINE  07-30-2013   @[facility] , [place]   bilateral T11 - 12,  L2 --5    REMOVAL LEFT T1 PARASPINOUS MASS  02-12-2016   @[facility] ,  [place]   desmoid fibromatosis   SHOULDER ARTHROSCOPY WITH ROTATOR CUFF REPAIR Left 12/15/2018   Procedure: SHOULDER ARTHROSCOPY, biceps tenodesis labored Debridement, submacromial decompression;  Surgeon: Gerome Lamar, MD;  Location: [facility];  Service: Orthopedics;  Laterality: Left;  interscalene block   SPINE SURGERY     TOTAL HIP ARTHROPLASTY Left 09/18/2017   @[facility] , [place]   TOTAL HIP REVISION Left 09/10/2021   Procedure: Left hip acetabular versus total hip arthroplasty revision, constrained liner;  Surgeon: Melodi Lerner, MD;  Location: [facility];  Service: Orthopedics;  Laterality: Left;   TOTAL HIP REVISION Left 10/01/2023   Procedure: Left hip constrained liner revision;  Surgeon: Melodi Lerner, MD;  Location: [facility];  Service: Orthopedics;  Laterality: Left;    Patient Active Problem List   Diagnosis Date Noted   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   Thrombocytopenia (HCC) 03/17/2023   Absolute anemia 03/13/2023   Family history of prostate cancer 03/13/2023   History of elevated PSA 03/13/2023   AV block, 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  Rationale for Evaluation and Treatment: Rehabilitation  ONSET DATE: 10/01/23 L hip revision  SUBJECTIVE:   SUBJECTIVE STATEMENT: Pt with good reports from ortho doctor, goes back in a  year.  Pt reports he had a L hip replacement in 2019 and dislocated it 3 times. Redid it 2023, had a fall and jammed it and had to revise it 10/01/23. Was told not to go too heavy over 110 lbs on the leg press. Was told not to cross his legs. Will be seeing ortho 11/11/23 and will further clarify precautions. Pt feels his greatest issue is strength. Has not yet returned to the gym. Has been walking some but wants to walk more. Plans to do water  aerobics. Uses his 4 wheel walker at baseline.  PERTINENT HISTORY: Parkinson's (Last saw PT for this in July 2024), back problems (surgery in neck and low back), R ankle rolls  PAIN:  Are you having pain? No  PRECAUTIONS: Fall  RED FLAGS: None   WEIGHT BEARING RESTRICTIONS: No  FALLS:  Has patient fallen in last 6 months? Yes. Number of falls 3 -- last fall yesterday getting pizza out of the stove  LIVING ENVIRONMENT: Lives with: lives with their spouse and daughter Lives in: House/apartment Stairs: Yes: Internal: 14 steps; can reach both Has following equipment at home: Vannie - 4 wheeled  OCCUPATION: Retired -- reads, tries to exercise, watches TV  PLOF: Independent  PATIENT GOALS: Return to exercise and walking  NEXT MD VISIT: 12/18/23  OBJECTIVE:  Note: Objective measures were completed at Evaluation unless otherwise noted.  DIAGNOSTIC FINDINGS: x-ray on R foot and ankle 03/25/22: Ankle joint space maintained. Elevated first ray. Midfoot arthritic changes present.   PATIENT SURVEYS:  LEFS: 37/80 = 46.25% EDEMA:  None now  MUSCLE LENGTH: Hamstrings: Right ~80 deg; Left ~70 deg Thomas test: Right 0 deg; Left 0 deg  POSTURE: rounded shoulders, forward head, increased thoracic kyphosis, and flexed trunk , R knee hyperextends and comes into varus, L knee goes into valgus  PALPATION: No overt tenderness to palpation   LOWER EXTREMITY MMT:  MMT Right eval Left eval  Hip flexion 5 5  Hip extension 3+ 3+  Hip abduction 3 3   Hip adduction    Hip internal rotation    Hip external rotation    Knee flexion 5 4  Knee extension 5 5  Ankle dorsiflexion    Ankle plantarflexion    Ankle inversion    Ankle eversion     (Blank rows = not tested)  LOWER EXTREMITY SPECIAL TESTS:  Did not assess  FUNCTIONAL TESTS:  5 times sit to stand: 15.1 sec without UEs Timed up and go (TUG): 13.77 sec Berg Balance Scale: 39/56     GAIT: Distance walked: Into clinic Assistive device utilized: Walker - 4 wheeled Level of assistance: SBA Comments: R knee hyperextension during stance, increased forward lean, scissors with R LE, decreased heel strike  TREATMENT DATE:  12/22/23 Recumbent bike L3 x 8 min Supine feet on peanut pball  Bridging with knees extended 2x10 Hamstring curl into bridge with knees flexed 2x10 Knee extension against black TB 2x10 Knees extended slide them on/off ball 2x10 Standing  Staggered stance rock fwd/bwd x30 no UE assist Gait training x3 laps CW, x 2.25 laps CCW with rollator - cues for heel strike, increased stride slength, and upright posture    12/18/23 Nustep L5 x 6 min Supine bridge 2x10 Upper trunk twist x 10 B SLR 2x10 B Supine hip ADD + ab brace 10x5; 2 set Standing glute sets Standing quad sets/TKE  Gait training - cues for heel strike, increased stride slength, and upright posture  12/15/23 Nustep L7 x 8 min Supine bridge 2x10 Supine hip abd 2x10 Sidelying clamshell red TB 2x10 Seated knee flex/ext red TB 2x10 Seated ankle DF red TB 2x10 Staggered stance glute setting 2x10 Staggered stance static balance with UE support 2x30 Amb x1 lap around gym with 4 wheel walker, focusing on hip extension and keeping chest up and diminishing knee hyperextension   12/08/23 Nustep L5 x 6 min 5xSTS Heel/toe raises x 20 Retro step x 20 RLE behind Standing  LLE toe tap lateral and posterior  x20 Mini squat at counter 10x3; 2 sets Leg curls 20lb 2x10 BLE Leg ext 20lb 2x10 BLE   11/20/23 Nustep L5 x 6 min Standing hip abduction, extension, marching RTB at thighs 2x10 Retro step x 15 [employer] pews x 15 Seated LAQ 3lb x 10 BLE Seated rows RTB x 20 Seated shoulder RTB x 15  11/12/23 Nustep L5 x 6 min Seated PWR! up red TB around thighs 2x10 Seated sit<>stand red TB around thighs focus on glute set 2x10 Standing PWR! Step back red TB around thighs 2x10 Standing PWR! Side step red TB around thighs 2x10 Standing terminal knee ext red TB focusing on eccentric control to decrease knee hyperextension 3x10 Eccentric sit to stand x10 Amb with rollator, focusing on hip extension and diminishing knee hyperextension with improved heel strike noted x 1 lap around gym    PATIENT EDUCATION:  Education details: Exam findings, POC, initial HEP Person educated: Patient Education method: Explanation, Demonstration, and Handouts Education comprehension: verbalized understanding, returned demonstration, and needs further education  HOME EXERCISE PROGRAM: Access Code: [email or web address] URL: [email or web address] Date: 11/20/2023 Prepared by: Braylin Clark  Exercises - Seated Hip Abduction with Resistance  - 1 x daily - 7 x weekly - 2 sets - 10 reps - Seated March with Resistance  - 1 x daily - 7 x weekly - 2 sets - 10 reps - Seated Gluteal Sets  - 1 x daily - 7 x weekly - 1 sets - 10 reps - 3 sec hold - Sit to Stand with Resistance Around Legs  - 1 x daily - 7 x weekly - 2 sets - 10 reps - Standing Hip Abduction with Resistance at Ankles and Counter Support  - 1 x daily - 7 x weekly - 2 sets - 10 reps - Standing Hip Extension with Resistance at Ankles and Counter Support  - 1 x daily - 7 x weekly - 2 sets - 10 reps  ASSESSMENT:  CLINICAL IMPRESSION: Continued to work on progressing hip strengthening and improving gait quality. Decreasing  trendelenburg pattern. Requires cues to keep hips extended to maintain upright posture. Now able to tolerate 5-6 laps of amb in the gym (~450') until fatigue.   Patient is a 73 y.o.  M who was seen today for physical therapy evaluation and treatment s/p L hip constrained liner revision (10/01/23). Pt states his L hip has been dislocated 3x and he has gotten surgery on it 3x. PMH of Parkinson's with prior neck and back surgery. Assessment significant for gross bilat LE weakness, L>R hip tightness in hamstrings/hip flexors and glutes, and high fall risk based on his Berg Balance. Pt will benefit from PT to address these deficits to improve his safety with home and community amb.   OBJECTIVE IMPAIRMENTS: Abnormal gait, decreased activity tolerance, decreased balance, decreased coordination, decreased endurance, decreased mobility, difficulty walking, decreased ROM, decreased strength, hypomobility, increased edema, increased fascial restrictions, increased muscle spasms, impaired flexibility, impaired tone, improper body mechanics, postural dysfunction, and pain.     GOALS: Goals reviewed with patient? Yes  SHORT TERM GOALS: Target date: 12/03/2023  Pt will be ind with initial HEP Baseline: Goal status: MET 11/20/23  2.  Pt will have improved 5x STS to </=13 sec to demo increased functional LE strength Baseline:  Goal status: - PROGRESSING 22 seconds 12/08/23  LONG TERM GOALS: Target date: 12/31/2023   Pt will be ind with management and progression of HEP Baseline:  Goal status: INITIAL  2.  Pt will have improved Berg Balance Score to >/=45 to demo decreased fall risk Baseline:  Goal status: INITIAL  3.  Pt will be able to alternate tapping his feet on a step without R knee hyperextension throwing off his balance to demo improving LE Stability for safe stair and curb negotiation Baseline:  Goal status: INITIAL  4.  Pt will have improved LEFS score to >/=46/80 to demo MCID Baseline:  Goal  status: INITIAL    PLAN:  PT FREQUENCY: 2x/week  PT DURATION: 8 weeks  PLANNED INTERVENTIONS: [id number]- PT Re-evaluation, 97110-Therapeutic exercises, 97530- Therapeutic activity, 97112- Neuromuscular re-education, 97535- Self Care, [id number]- Manual therapy, [phone]- Gait training, [phone]- Aquatic Therapy, [phone]- Electrical stimulation (unattended), [phone]- Ionotophoresis 4mg /ml Dexamethasone , Patient/Family education, Balance training, Stair training, Taping, Dry Needling, Joint mobilization, Spinal mobilization, Cryotherapy, and Moist heat  PLAN FOR NEXT SESSION: Assess response to HEP. Work on decreasing R knee hyperextension and reducing posterior LOBs. Gross bilat hip strengthening and core strengthening.    Gabreil [place]on[place] April Ma L Brien Lowe, PT, DPT 12/22/2023, 11:10 AM

## 2023-12-29 ENCOUNTER — Ambulatory Visit: Admitting: Physical Therapy

## 2023-12-29 ENCOUNTER — Encounter: Payer: Self-pay | Admitting: Physical Therapy

## 2023-12-29 DIAGNOSIS — M6281 Muscle weakness (generalized): Secondary | ICD-10-CM

## 2023-12-29 DIAGNOSIS — R2689 Other abnormalities of gait and mobility: Secondary | ICD-10-CM

## 2023-12-29 DIAGNOSIS — R2681 Unsteadiness on feet: Secondary | ICD-10-CM

## 2023-12-29 DIAGNOSIS — R29818 Other symptoms and signs involving the nervous system: Secondary | ICD-10-CM

## 2023-12-29 DIAGNOSIS — R293 Abnormal posture: Secondary | ICD-10-CM

## 2023-12-29 NOTE — Therapy (Signed)
 OUTPATIENT PHYSICAL THERAPY LOWER EXTREMITY TREATMENT   Patient Name: Jim Carson MRN: [id number] DOB:Oct 10, 1950, 73 y.o., male Today's Date: 12/29/2023  END OF SESSION:  PT End of Session - 12/29/23 1539     Visit Number 9    Authorization Type Medicare + AARP    PT Start Time 1535    PT Stop Time 1615    PT Time Calculation (min) 40 min    Activity Tolerance Patient tolerated treatment well    Behavior During Therapy [facility] for tasks assessed/performed           Past Medical History:  Diagnosis Date   Abnormal gait    due to parkinson's,  uses cane   Absolute anemia 03/13/2023   Allergic rhinitis    Allergy Spring 1998   Allergic to dustmits, dog hair and roaches.   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 glendia  @[facility]   Pneumonia    Sleep apnea    Vitamin B12 deficiency    Vitamin D  deficiency    Wears glasses    Past Surgical History:  Procedure Laterality Date   APPENDECTOMY  07/1964   APPENDECTOMY     CHOLECYSTECTOMY     JOINT REPLACEMENT  March 2019 and again on 09/10/2021   Left hip twice.  2nd time due to 3 dislocations   LAPAROSCOPIC CHOLECYSTECTOMY  01-21-2017   @[facility] , [place]   lower back surgery      POSTERIOR CERVICAL FUSION/FORAMINOTOMY     POSTERIOR FUSION CERVICAL SPINE  08-15-2011   @[facility] , [place]   w/ laminectomy and decompression-- C3 -- T1   POSTERIOR LAMINECTOMY / DECOMPRESSION LUMBAR SPINE  07-30-2013   @[facility] , [place]   bilateral T11 - 12,  L2 --5    REMOVAL LEFT T1 PARASPINOUS MASS  02-12-2016   @[facility] ,  [place]   desmoid fibromatosis   SHOULDER ARTHROSCOPY WITH ROTATOR CUFF REPAIR Left 12/15/2018   Procedure: SHOULDER ARTHROSCOPY, biceps tenodesis labored Debridement, submacromial decompression;  Surgeon: Gerome Lamar, MD;  Location: [facility];  Service: Orthopedics;  Laterality: Left;  interscalene block   SPINE SURGERY     TOTAL HIP ARTHROPLASTY Left 09/18/2017   @[facility] , [place]   TOTAL HIP REVISION Left 09/10/2021   Procedure: Left hip acetabular versus total hip arthroplasty revision, constrained liner;  Surgeon: Melodi Lerner, MD;  Location: [facility];  Service: Orthopedics;  Laterality: Left;   TOTAL HIP REVISION Left 10/01/2023   Procedure: Left hip constrained liner revision;  Surgeon: Melodi Lerner, MD;  Location: [facility];  Service: Orthopedics;  Laterality: Left;    Patient Active Problem List   Diagnosis Date Noted   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   Thrombocytopenia (HCC) 03/17/2023   Absolute anemia 03/13/2023   Family history of prostate cancer 03/13/2023   History of elevated PSA 03/13/2023   AV block, 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   Drug induced constipation 12/30/2022   Acquired hypothyroidism 12/30/2022   Primary osteoarthritis of left hip 05/18/2021   PD (Parkinson's disease) (HCC) 05/18/2021   History of gastroesophageal reflux (GERD) 05/18/2021   OSA (obstructive sleep apnea) 05/18/2021   Essential hypertension 05/18/2021    PCP: Abran Jon CROME, MD  REFERRING PROVIDER: Kristian Stabs, PA  REFERRING DIAG: [phone]: Encounter for other orthopedic aftercare S/P left hip constrained liner revision  THERAPY DIAG:  Muscle weakness (generalized)  Unsteadiness on feet  Other abnormalities of gait and mobility  Abnormal posture  Other symptoms and signs involving the nervous system  Rationale for Evaluation and  Treatment: Rehabilitation  ONSET DATE: 10/01/23 L hip revision  SUBJECTIVE:   SUBJECTIVE STATEMENT: Pt with good reports from ortho doctor, goes back in a year.  Pt reports he had a L hip replacement in 2019 and dislocated it 3 times. Redid it 2023, had a fall and jammed it and had to revise it 10/01/23. Was told not to go too heavy over 110 lbs on the leg press. Was told not to cross his legs. Will be seeing ortho 11/11/23 and will further clarify precautions. Pt feels his greatest issue is strength. Has not yet returned to the gym. Has been walking some but wants to walk more. Plans to do water  aerobics. Uses his 4 wheel walker at baseline.  PERTINENT HISTORY: Parkinson's (Last saw PT for this in July 2024), back problems (surgery in neck and low back), R ankle rolls  PAIN:  Are you having pain? No  PRECAUTIONS: Fall  RED FLAGS: None   WEIGHT BEARING RESTRICTIONS: No  FALLS:  Has patient fallen in last 6 months? Yes. Number of falls 3 -- last fall yesterday getting pizza out of the stove  LIVING ENVIRONMENT: Lives with: lives with their spouse and daughter Lives in: House/apartment Stairs: Yes: Internal: 14 steps; can reach both Has following equipment at home: Vannie - 4 wheeled  OCCUPATION: Retired -- reads, tries to exercise, watches TV  PLOF: Independent  PATIENT GOALS: Return to exercise and walking  NEXT MD VISIT: 12/18/23  OBJECTIVE:  Note: Objective measures were completed at Evaluation unless otherwise noted.  DIAGNOSTIC FINDINGS: x-ray on R foot and ankle 03/25/22: Ankle joint space maintained. Elevated first ray. Midfoot arthritic changes present.   PATIENT SURVEYS:  LEFS: 37/80 = 46.25% EDEMA:  None now  MUSCLE LENGTH: Hamstrings: Right ~80 deg; Left ~70 deg Thomas test: Right 0 deg; Left 0 deg  POSTURE: rounded shoulders, forward head, increased thoracic kyphosis, and flexed trunk , R knee hyperextends and comes into varus, L knee goes into  valgus  PALPATION: No overt tenderness to palpation   LOWER EXTREMITY MMT:  MMT Right eval Left eval  Hip flexion 5 5  Hip extension 3+ 3+  Hip abduction 3 3  Hip adduction    Hip internal rotation    Hip external rotation    Knee flexion 5 4  Knee extension 5 5  Ankle dorsiflexion    Ankle plantarflexion    Ankle inversion    Ankle eversion     (Blank rows = not tested)  LOWER EXTREMITY SPECIAL TESTS:  Did not assess  FUNCTIONAL TESTS:  5 times sit to stand: 15.1 sec without UEs Timed up and go (TUG): 13.77 sec Berg Balance Scale: 39/56     GAIT: Distance walked: Into clinic Assistive device utilized: Walker - 4 wheeled Level of assistance: SBA Comments: R knee  hyperextension during stance, increased forward lean, scissors with R LE, decreased heel strike                                                                                                                                TREATMENT DATE:  12/29/23 Nustep L5 x 8 min Hip hinge holding on to counter into glute setting 2x10 Counter plank 3x30 Counter lumbar ext x10 Staggered stance rock fwd/bwd 2x10 focusing on hip extension during stance Standing hip ext 2x10 cues to keep trunk forward Gait training x3 laps CW, x 3 laps CCW with rollator - cues for heel strike with soft knee bend to keep from knee hyperextension transitioning to hip extension during stance phase  12/22/23 Recumbent bike L3 x 8 min Supine feet on peanut pball  Bridging with knees extended 2x10 Hamstring curl into bridge with knees flexed 2x10 Knee extension against black TB 2x10 Knees extended slide them on/off ball 2x10 Standing  Staggered stance rock fwd/bwd x30 no UE assist Gait training x3 laps CW, x 2.25 laps CCW with rollator - cues for heel strike, increased stride slength, and upright posture    12/18/23 Nustep L5 x 6 min Supine bridge 2x10 Upper trunk twist x 10 B SLR 2x10 B Supine hip ADD + ab brace 10x5; 2  set Standing glute sets Standing quad sets/TKE  Gait training - cues for heel strike, increased stride slength, and upright posture  12/15/23 Nustep L7 x 8 min Supine bridge 2x10 Supine hip abd 2x10 Sidelying clamshell red TB 2x10 Seated knee flex/ext red TB 2x10 Seated ankle DF red TB 2x10 Staggered stance glute setting 2x10 Staggered stance static balance with UE support 2x30 Amb x1 lap around gym with 4 wheel walker, focusing on hip extension and keeping chest up and diminishing knee hyperextension   12/08/23 Nustep L5 x 6 min 5xSTS Heel/toe raises x 20 Retro step x 20 RLE behind Standing LLE toe tap lateral and posterior  x20 Mini squat at counter 10x3; 2 sets Leg curls 20lb 2x10 BLE Leg ext 20lb 2x10 BLE   11/20/23 Nustep L5 x 6 min Standing hip abduction, extension, marching RTB at thighs 2x10 Retro step x 15 [employer] pews x 15 Seated LAQ 3lb x 10 BLE Seated rows RTB x 20 Seated shoulder RTB x 15  11/12/23 Nustep L5 x 6 min Seated PWR! up red TB around thighs 2x10 Seated sit<>stand red TB around thighs focus on glute set 2x10 Standing PWR! Step back red TB around thighs 2x10 Standing PWR! Side step red TB around thighs 2x10 Standing terminal knee ext red TB focusing on eccentric control to decrease knee hyperextension 3x10 Eccentric sit to stand x10 Amb with rollator, focusing on hip extension and diminishing knee hyperextension with improved heel strike noted x 1 lap around gym    PATIENT EDUCATION:  Education details: Exam findings, POC, initial HEP Person educated: Patient Education method: Explanation, Demonstration, and Handouts Education  comprehension: verbalized understanding, returned demonstration, and needs further education  HOME EXERCISE PROGRAM: Access Code: [email or web address] URL: [email or web address] Date: 11/20/2023 Prepared by: Braylin Clark  Exercises - Seated Hip Abduction with Resistance  - 1 x daily - 7 x weekly - 2 sets - 10  reps - Seated March with Resistance  - 1 x daily - 7 x weekly - 2 sets - 10 reps - Seated Gluteal Sets  - 1 x daily - 7 x weekly - 1 sets - 10 reps - 3 sec hold - Sit to Stand with Resistance Around Legs  - 1 x daily - 7 x weekly - 2 sets - 10 reps - Standing Hip Abduction with Resistance at Ankles and Counter Support  - 1 x daily - 7 x weekly - 2 sets - 10 reps - Standing Hip Extension with Resistance at Ankles and Counter Support  - 1 x daily - 7 x weekly - 2 sets - 10 reps  ASSESSMENT:  CLINICAL IMPRESSION: Continued to work on gross hip strengthening and improving postural stability as well as endurance with ambulating utilizing improved form. Will recheck pt's goals next session and re-cert him for at least 7 more visits (had to hold on a few PT visits due to pt having eye surgery). Pt will benefit from continued PT to improve his strength, balance and activity tolerance for safe mobility.   Patient is a 73 y.o. M who was seen today for physical therapy evaluation and treatment s/p L hip constrained liner revision (10/01/23). Pt states his L hip has been dislocated 3x and he has gotten surgery on it 3x. PMH of Parkinson's with prior neck and back surgery. Assessment significant for gross bilat LE weakness, L>R hip tightness in hamstrings/hip flexors and glutes, and high fall risk based on his Berg Balance. Pt will benefit from PT to address these deficits to improve his safety with home and community amb.   OBJECTIVE IMPAIRMENTS: Abnormal gait, decreased activity tolerance, decreased balance, decreased coordination, decreased endurance, decreased mobility, difficulty walking, decreased ROM, decreased strength, hypomobility, increased edema, increased fascial restrictions, increased muscle spasms, impaired flexibility, impaired tone, improper body mechanics, postural dysfunction, and pain.     GOALS: Goals reviewed with patient? Yes  SHORT TERM GOALS: Target date: 12/03/2023  Pt will be ind  with initial HEP Baseline: Goal status: MET 11/20/23  2.  Pt will have improved 5x STS to </=13 sec to demo increased functional LE strength Baseline:  Goal status: - PROGRESSING 22 seconds 12/08/23  LONG TERM GOALS: Target date: 12/31/2023   Pt will be ind with management and progression of HEP Baseline:  Goal status: IN PROGRESS  2.  Pt will have improved Berg Balance Score to >/=45 to demo decreased fall risk Baseline:  Goal status: IN PROGRESS  3.  Pt will be able to alternate tapping his feet on a step without R knee hyperextension throwing off his balance to demo improving LE Stability for safe stair and curb negotiation Baseline:  Goal status: IN PROGRESS  4.  Pt will have improved LEFS score to >/=46/80 to demo MCID Baseline:  Goal status: IN PROGRESS    PLAN:  PT FREQUENCY: 2x/week  PT DURATION: 8 weeks  PLANNED INTERVENTIONS: 97164- PT Re-evaluation, 97110-Therapeutic exercises, 97530- Therapeutic activity, 97112- Neuromuscular re-education, 97535- Self Care, [id number]- Manual therapy, [phone]- Gait training, [phone]- Aquatic Therapy, [phone]- Electrical stimulation (unattended), [phone]- Ionotophoresis 4mg /ml Dexamethasone , Patient/Family education, Balance training, Stair training, Taping, Dry Needling, Joint mobilization, Spinal mobilization, Cryotherapy,  and Moist heat  PLAN FOR NEXT SESSION: Recheck goals for re-cert -- discussed 7 more visits with patient so he completes his 16 intended visits. Assess response to HEP. Work on decreasing R knee hyperextension and reducing posterior LOBs. Gross bilat hip strengthening and core strengthening.    Jaramiah Bossard April Ma L Kindal Ponti, PT, DPT 12/29/2023, 3:39 PM

## 2024-01-01 ENCOUNTER — Encounter: Payer: Self-pay | Admitting: Rehabilitation

## 2024-01-01 ENCOUNTER — Ambulatory Visit: Attending: Student | Admitting: Rehabilitation

## 2024-01-01 DIAGNOSIS — R2689 Other abnormalities of gait and mobility: Secondary | ICD-10-CM | POA: Diagnosis present

## 2024-01-01 DIAGNOSIS — R293 Abnormal posture: Secondary | ICD-10-CM | POA: Diagnosis present

## 2024-01-01 DIAGNOSIS — R29818 Other symptoms and signs involving the nervous system: Secondary | ICD-10-CM | POA: Insufficient documentation

## 2024-01-01 DIAGNOSIS — R2681 Unsteadiness on feet: Secondary | ICD-10-CM | POA: Insufficient documentation

## 2024-01-01 DIAGNOSIS — M6281 Muscle weakness (generalized): Secondary | ICD-10-CM | POA: Diagnosis present

## 2024-01-01 NOTE — Therapy (Signed)
 OUTPATIENT PHYSICAL THERAPY LOWER EXTREMITY RECERTIFICATION    Patient Name: Jim Carson MRN: [id number] DOB:[birth year], 73 y.o., male Today's Date: 01/02/2024  END OF SESSION:  PT End of Session - 01/01/24 1551     Visit Number 10    Authorization Type Medicare + AARP    PT Start Time 1537    PT Stop Time 1617    PT Time Calculation (min) 40 min    Equipment Utilized During Treatment Gait belt    Activity Tolerance Patient tolerated treatment well    Behavior During Therapy WFL for tasks assessed/performed            Past Medical History:  Diagnosis Date   Abnormal gait    due to parkinson's,  uses cane   Absolute anemia 03/13/2023   Allergic rhinitis    Allergy Spring 1998   Allergic to dustmits, dog hair and roaches.   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 glendia  @[facility]   Pneumonia    Sleep apnea    Vitamin B12 deficiency    Vitamin D  deficiency    Wears glasses    Past Surgical History:  Procedure Laterality Date   APPENDECTOMY  07/1964   APPENDECTOMY     CHOLECYSTECTOMY     JOINT REPLACEMENT  March 2019 and again on 09/10/2021   Left hip twice.  2nd time due to 3 dislocations   LAPAROSCOPIC CHOLECYSTECTOMY  01-21-2017   @[facility] , [place]   lower back surgery      POSTERIOR CERVICAL FUSION/FORAMINOTOMY     POSTERIOR FUSION CERVICAL SPINE  08-15-2011   @[facility] , [place]   w/ laminectomy and decompression-- C3 -- T1   POSTERIOR LAMINECTOMY / DECOMPRESSION LUMBAR SPINE   07-30-2013   @[facility] , [place]   bilateral T11 - 12,  L2 --5   REMOVAL LEFT T1 PARASPINOUS MASS  02-12-2016   @[facility] ,  [place]   desmoid fibromatosis   SHOULDER ARTHROSCOPY WITH ROTATOR CUFF REPAIR Left 12/15/2018   Procedure: SHOULDER ARTHROSCOPY, biceps tenodesis labored Debridement, submacromial decompression;  Surgeon: Gerome Lamar, MD;  Location: [facility];  Service: Orthopedics;  Laterality: Left;  interscalene block   SPINE SURGERY     TOTAL HIP ARTHROPLASTY Left 09/18/2017   @[facility] , [place]   TOTAL HIP REVISION Left 09/10/2021   Procedure: Left hip acetabular versus total hip arthroplasty revision, constrained liner;  Surgeon: Melodi Lerner, MD;  Location: [facility];  Service: Orthopedics;  Laterality: Left;   TOTAL HIP REVISION Left 10/01/2023   Procedure: Left hip constrained liner revision;  Surgeon: Melodi Lerner, MD;  Location: [facility];  Service: Orthopedics;  Laterality: Left;    Patient Active Problem List   Diagnosis Date Noted   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   Thrombocytopenia (HCC) 03/17/2023   Absolute anemia 03/13/2023   Family history of prostate cancer 03/13/2023   History of elevated PSA  03/13/2023   AV block, 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   Drug induced constipation 12/30/2022   Acquired hypothyroidism 12/30/2022   Primary osteoarthritis of left hip 05/18/2021   PD (Parkinson's disease) (HCC) 05/18/2021   History of gastroesophageal reflux (GERD) 05/18/2021   OSA (obstructive sleep apnea) 05/18/2021   Essential hypertension 05/18/2021    PCP: Abran Jon CROME, MD  REFERRING PROVIDER: Kristian Stabs, PA  REFERRING DIAG: Z47.89: Encounter for other orthopedic aftercare S/P left hip constrained liner revision  THERAPY DIAG:  Muscle weakness (generalized)  Unsteadiness on feet  Rationale for Evaluation and Treatment: Rehabilitation  ONSET DATE: 10/01/23 L hip  revision  SUBJECTIVE:   SUBJECTIVE STATEMENT: Patient reports feels ok today.  Denies any falls.   Biggest c/o is bilateral knee hyperextension with gait.  States he looses his balance easily and he feels it is due to persistent knee hyperextension R>L.  Feels like BLE weakness is the root cause.    Pt reports he had a L hip replacement in 2019 and dislocated it 3 times. Redid it 2023, had a fall and jammed it and had to revise it 10/01/23. Was told not to go too heavy over 110 lbs on the leg press. Was told not to cross his legs. Will be seeing ortho 11/11/23 and will further clarify precautions. Pt feels his greatest issue is strength. Has not yet returned to the gym. Has been walking some but wants to walk more. Plans to do water  aerobics. Uses his 4 wheel walker at baseline.  PERTINENT HISTORY: Parkinson's (Last saw PT for this in July 2024), back problems (surgery in neck and low back), R ankle rolls  PAIN:  Are you having pain? No  PRECAUTIONS: Fall  RED FLAGS: None   WEIGHT BEARING RESTRICTIONS: No  FALLS:  Has patient fallen in last 6 months? Yes. Number of falls 3 -- last fall yesterday getting pizza out of the stove  LIVING ENVIRONMENT: Lives with: lives with their spouse and daughter Lives in: House/apartment Stairs: Yes: Internal: 14 steps; can reach both Has following equipment at home: Vannie - 4 wheeled  OCCUPATION: Retired -- reads, tries to exercise, watches TV  PLOF: Independent  PATIENT GOALS: Return to exercise and walking  NEXT MD VISIT: 12/18/23  OBJECTIVE:  Note: Objective measures were completed at Evaluation unless otherwise noted.  DIAGNOSTIC FINDINGS: x-ray on R foot and ankle 03/25/22: Ankle joint space maintained. Elevated first ray. Midfoot arthritic changes present.   PATIENT SURVEYS:  LEFS: 37/80 = 46.25% EDEMA:  None now  MUSCLE LENGTH: Hamstrings: Right ~80 deg; Left ~70 deg Thomas test: Right 0 deg; Left 0 deg  POSTURE: rounded  shoulders, forward head, increased thoracic kyphosis, and flexed trunk , R knee hyperextends and comes into varus, L knee goes into valgus  PALPATION: No overt tenderness to palpation   LOWER EXTREMITY MMT:  MMT Right eval Left eval  Hip flexion 5 5  Hip extension 3+ 3+  Hip abduction 3 3  Hip adduction    Hip internal rotation    Hip external rotation    Knee flexion 5 4  Knee extension 5 5  Ankle dorsiflexion    Ankle plantarflexion    Ankle inversion    Ankle eversion     (Blank rows = not tested)  LOWER EXTREMITY SPECIAL TESTS:  Did not assess  FUNCTIONAL TESTS:  5 times sit to stand: 15.1 sec without UEs Timed up and go (TUG): 13.77  sec Berg Balance Scale: 39/56     GAIT: Distance walked: Into clinic Assistive device utilized: Walker - 4 wheeled Level of assistance: SBA Comments: R knee hyperextension during stance, increased forward lean, scissors with R LE, decreased heel strike                                                                                                                                TREATMENT DATE:  01/01/24 THERAPEUTIC EXERCISE: To improve strength and endurance.  Demonstration, verbal and tactile cues throughout for technique. NuStep L5 x 6'  NEUROMUSCULAR RE-EDUCATION: To improve balance, coordination, posture, reduce fall risk, and amplitude of movement. Bent knee sidestepping counter x 3 laps Bent knee F/B gait at counter x 3 laps Bent knee sidestepping back to counter with   5# BLE x 2 laps Step lunges x 10 BLE  Stagger stance f/b manual wt shifting Decline ramp standing with marching and manual NDT for knee hyperextension prevention x 10 BLE Gait training rollator x 3 gym laps with manual blocking to prevent knee hyperextension with variable manual vs. Vc Alternating step touch on 7 x 10 with cueing to avoid hyperextension  THERAPEUTIC ACTIVITIES: To improve functional performance.  Demonstration, verbal and tactile cues  throughout for technique. Step ups 8 x 10 BLE Lumbar extension at counter x 15 Seated knee extension 5# w/ 3 sec holds  x 20 BLE 5X STS =15 sec.  12/29/23 Nustep L5 x 8 min Hip hinge holding on to counter into glute setting 2x10 Counter plank 3x30 Counter lumbar ext x10 Staggered stance rock fwd/bwd 2x10 focusing on hip extension during stance Standing hip ext 2x10 cues to keep trunk forward Gait training x3 laps CW, x 3 laps CCW with rollator - cues for heel strike with soft knee bend to keep from knee hyperextension transitioning to hip extension during stance phase  12/22/23 Recumbent bike L3 x 8 min Supine feet on peanut pball  Bridging with knees extended 2x10 Hamstring curl into bridge with knees flexed 2x10 Knee extension against black TB 2x10 Knees extended slide them on/off ball 2x10 Standing  Staggered stance rock fwd/bwd x30 no UE assist Gait training x3 laps CW, x 2.25 laps CCW with rollator - cues for heel strike, increased stride slength, and upright posture    12/18/23 Nustep L5 x 6 min Supine bridge 2x10 Upper trunk twist x 10 B SLR 2x10 B Supine hip ADD + ab brace 10x5; 2 set Standing glute sets Standing quad sets/TKE  Gait training - cues for heel strike, increased stride slength, and upright posture  12/15/23 Nustep L7 x 8 min Supine bridge 2x10 Supine hip abd 2x10 Sidelying clamshell red TB 2x10 Seated knee flex/ext red TB 2x10 Seated ankle DF red TB 2x10 Staggered stance glute setting 2x10 Staggered stance static balance with UE support 2x30 Amb x1 lap around gym with 4 wheel walker, focusing on hip extension and keeping chest up and  diminishing knee hyperextension   12/08/23 Nustep L5 x 6 min 5x STS =17 sec Heel/toe raises x 20 Retro step x 20 RLE behind Standing LLE toe tap lateral and posterior  x20 Mini squat at counter 10x3; 2 sets Leg curls 20lb 2x10 BLE Leg ext 20lb 2x10 BLE   11/20/23 Nustep L5 x 6 min Standing hip abduction,  extension, marching RTB at thighs 2x10 Retro step x 15 Church pews x 15 Seated LAQ 3lb x 10 BLE Seated rows RTB x 20 Seated shoulder RTB x 15  11/12/23 Nustep L5 x 6 min Seated PWR! up red TB around thighs 2x10 Seated sit<>stand red TB around thighs focus on glute set 2x10 Standing PWR! Step back red TB around thighs 2x10 Standing PWR! Side step red TB around thighs 2x10 Standing terminal knee ext red TB focusing on eccentric control to decrease knee hyperextension 3x10 Eccentric sit to stand x10 Amb with rollator, focusing on hip extension and diminishing knee hyperextension with improved heel strike noted x 1 lap around gym    PATIENT EDUCATION:  Education details: Exam findings, POC, initial HEP Person educated: Patient Education method: Explanation, Demonstration, and Handouts Education comprehension: verbalized understanding, returned demonstration, and needs further education  HOME EXERCISE PROGRAM: Access Code: [email or web address] URL: [email or web address] Date: 11/20/2023 Prepared by: Braylin Clark  Exercises - Seated Hip Abduction with Resistance  - 1 x daily - 7 x weekly - 2 sets - 10 reps - Seated March with Resistance  - 1 x daily - 7 x weekly - 2 sets - 10 reps - Seated Gluteal Sets  - 1 x daily - 7 x weekly - 1 sets - 10 reps - 3 sec hold - Sit to Stand with Resistance Around Legs  - 1 x daily - 7 x weekly - 2 sets - 10 reps - Standing Hip Abduction with Resistance at Ankles and Counter Support  - 1 x daily - 7 x weekly - 2 sets - 10 reps - Standing Hip Extension with Resistance at Ankles and Counter Support  - 1 x daily - 7 x weekly - 2 sets - 10 reps  ASSESSMENT:  CLINICAL IMPRESSION: Patient has been seen x 2 months PT following a 2nd R THA revision with constrained liner due to multiple THA dislocations.  He has had to cancel a number of his visits due to eye surgery.   HE also has Parkinson's dz which has slowed his recovery time.   However, he is  making good progress to goals.  He has not yet attained all of the goals that we have set for him, but has good rehab potential to meet all goals.   5x sit to stand has improved from 22 to 17 sec, and he has met his goal for being able to alternately toe tap on a step without knee hyperextension, but requires vc to do it consistently.   He still has bilateral knee hyperextension with ambulation with his rollator, but he can self correct with vc.   Lars is not checked today, but will be checked next visit.   Discussed orthotist consult for AFO's to help with knee hyperextension.  Recommended patient  f/u with his PCP for F2F referral to orthotist.  PT remains necessary 2x weekly x 4 more weeks for gait, balance, strength, stair negotiation deficits.    Patient is agreeable to this plan of care.  Patient is a 73 y.o. M who was seen today for physical therapy evaluation and treatment s/p  L hip constrained liner revision (10/01/23). Pt states his L hip has been dislocated 3x and he has gotten surgery on it 3x. PMH of Parkinson's with prior neck and back surgery. Assessment significant for gross bilat LE weakness, L>R hip tightness in hamstrings/hip flexors and glutes, and high fall risk based on his Berg Balance. Pt will benefit from PT to address these deficits to improve his safety with home and community amb.   OBJECTIVE IMPAIRMENTS: Abnormal gait, decreased activity tolerance, decreased balance, decreased coordination, decreased endurance, decreased mobility, difficulty walking, decreased ROM, decreased strength, hypomobility, increased edema, increased fascial restrictions, increased muscle spasms, impaired flexibility, impaired tone, improper body mechanics, postural dysfunction, and pain.     GOALS: Goals reviewed with patient? Yes  SHORT TERM GOALS: Target date: 01/16/2024  Pt will be ind with initial HEP Baseline: Goal status: MET 11/20/23  2.  Pt will have improved 5x STS to </=13 sec to demo  increased functional LE strength Baseline:  Goal status: - PROGRESSING 15 seconds 01/01/24    LONG TERM GOALS: Target date:  01/30/2024  Pt will be ind with management and progression of HEP Baseline:  Goal status: MET  2.  Pt will have improved Berg Balance Score to >/=45 to demo decreased fall risk Baseline:  Goal status: IN PROGRESS  3.  Pt will be able to alternate tapping his feet on a step without R knee hyperextension throwing off his balance to demo improving LE Stability for safe stair and curb negotiation Baseline:  Goal status:  MET with v/c  4.  Pt will have improved LEFS score to >/=46/80 to demo MCID Baseline:  Goal status: IN PROGRESS    PLAN:/  PT FREQUENCY: 2x/week  PT DURATION: 8 weeks  PLANNED INTERVENTIONS: 97164- PT Re-evaluation, 97110-Therapeutic exercises, 97530- Therapeutic activity, 97112- Neuromuscular re-education, 97535- Self Care, [id number]- Manual therapy, [phone]- Gait training, [phone]- Aquatic Therapy, [phone]- Electrical stimulation (unattended), [phone]- Ionotophoresis 4mg /ml Dexamethasone , Patient/Family education, Balance training, Stair training, Taping, Dry Needling, Joint mobilization, Spinal mobilization, Cryotherapy, and Moist heat  PLAN FOR NEXT SESSION: Continue PT for further hip strengthening, neuro reeducation for gait training and stair training with less knee hyperextension.  Reassess Berg and LEFS next visit to update goals PRN.      Anahit Klumb, PT, DPT 01/02/2024, 11:36 AM

## 2024-01-02 ENCOUNTER — Encounter: Payer: Self-pay | Admitting: Rehabilitation

## 2024-01-05 ENCOUNTER — Encounter: Payer: Self-pay | Admitting: Rehabilitation

## 2024-01-05 ENCOUNTER — Other Ambulatory Visit: Payer: Self-pay

## 2024-01-05 ENCOUNTER — Ambulatory Visit: Admitting: Rehabilitation

## 2024-01-05 DIAGNOSIS — M6281 Muscle weakness (generalized): Secondary | ICD-10-CM | POA: Diagnosis not present

## 2024-01-05 DIAGNOSIS — R2689 Other abnormalities of gait and mobility: Secondary | ICD-10-CM

## 2024-01-05 DIAGNOSIS — R2681 Unsteadiness on feet: Secondary | ICD-10-CM

## 2024-01-05 DIAGNOSIS — R293 Abnormal posture: Secondary | ICD-10-CM

## 2024-01-05 NOTE — Therapy (Addendum)
 OUTPATIENT PHYSICAL THERAPY LOWER EXTREMITY RECERTIFICATION    Patient Name: ORSON RHO MRN: [id number] DOB:12-Feb-1951, 73 y.o., male Today's Date: 01/05/2024  END OF SESSION:  PT End of Session - 01/05/24 1629     Visit Number 11    Date for PT Re-Evaluation 01/30/24    Authorization Type Medicare + AARP    PT Start Time 1620    PT Stop Time 1710    PT Time Calculation (min) 50 min    Equipment Utilized During Treatment Gait belt    Activity Tolerance Patient tolerated treatment well    Behavior During Therapy WFL for tasks assessed/performed             Past Medical History:  Diagnosis Date   Abnormal gait    due to parkinson's,  uses cane   Absolute anemia 03/13/2023   Allergic rhinitis    Allergy Spring 1998   Allergic to dustmits, dog hair and roaches.   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 glendia  @[facility]   Pneumonia    Sleep apnea    Vitamin B12 deficiency    Vitamin D  deficiency    Wears glasses    Past Surgical History:  Procedure Laterality Date   APPENDECTOMY  07/1964   APPENDECTOMY     CHOLECYSTECTOMY     JOINT REPLACEMENT  March 2019 and again on 09/10/2021   Left hip twice.  2nd time due to 3 dislocations   LAPAROSCOPIC CHOLECYSTECTOMY  01-21-2017   @[facility] , [place]   lower back surgery      POSTERIOR CERVICAL FUSION/FORAMINOTOMY     POSTERIOR FUSION CERVICAL SPINE  08-15-2011   @[facility] , [place]   w/ laminectomy and decompression-- C3 -- T1   POSTERIOR  LAMINECTOMY / DECOMPRESSION LUMBAR SPINE  07-30-2013   @[facility] , [place]   bilateral T11 - 12,  L2 --5   REMOVAL LEFT T1 PARASPINOUS MASS  02-12-2016   @[facility] ,  [place]   desmoid fibromatosis   SHOULDER ARTHROSCOPY WITH ROTATOR CUFF REPAIR Left 12/15/2018   Procedure: SHOULDER ARTHROSCOPY, biceps tenodesis labored Debridement, submacromial decompression;  Surgeon: Gerome Lamar, MD;  Location: [facility];  Service: Orthopedics;  Laterality: Left;  interscalene block   SPINE SURGERY     TOTAL HIP ARTHROPLASTY Left 09/18/2017   @[facility] , [place]   TOTAL HIP REVISION Left 09/10/2021   Procedure: Left hip acetabular versus total hip arthroplasty revision, constrained liner;  Surgeon: Melodi Lerner, MD;  Location: [facility];  Service: Orthopedics;  Laterality: Left;   TOTAL HIP REVISION Left 10/01/2023   Procedure: Left hip constrained liner revision;  Surgeon: Melodi Lerner, MD;  Location: [facility];  Service: Orthopedics;  Laterality: Left;    Patient Active Problem List   Diagnosis Date Noted   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   Thrombocytopenia (HCC) 03/17/2023   Absolute anemia 03/13/2023   Family history of  prostate cancer 03/13/2023   History of elevated PSA 03/13/2023   AV block, 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   Drug induced constipation 12/30/2022   Acquired hypothyroidism 12/30/2022   Primary osteoarthritis of left hip 05/18/2021   PD (Parkinson's disease) (HCC) 05/18/2021   History of gastroesophageal reflux (GERD) 05/18/2021   OSA (obstructive sleep apnea) 05/18/2021   Essential hypertension 05/18/2021    PCP: Abran Jon CROME, MD  REFERRING PROVIDER: Kristian Stabs, PA  REFERRING DIAG: Z47.89: Encounter for other orthopedic aftercare S/P left hip constrained liner revision  THERAPY DIAG:  Muscle weakness (generalized)  Unsteadiness on feet  Other abnormalities of gait and mobility  Abnormal  posture  Rationale for Evaluation and Treatment: Rehabilitation  ONSET DATE: 10/01/23 L hip revision  SUBJECTIVE:   SUBJECTIVE STATEMENT: Pt. States feels ok today.   He is going to f/u with his neurologist at [facility] on Thursday and is going to speak with them about Orhotist consult for AFO's to prevent his knee hyperextension.   He has not had any falls.    Pt reports he had a L hip replacement in 2019 and dislocated it 3 times. Redid it 2023, had a fall and jammed it and had to revise it 10/01/23. Was told not to go too heavy over 110 lbs on the leg press. Was told not to cross his legs. Will be seeing ortho 11/11/23 and will further clarify precautions. Pt feels his greatest issue is strength. Has not yet returned to the gym. Has been walking some but wants to walk more. Plans to do water  aerobics. Uses his 4 wheel walker at baseline.  PERTINENT HISTORY: Parkinson's (Last saw PT for this in July 2024), back problems (surgery in neck and low back), R ankle rolls  PAIN:  Are you having pain? No and denies pain  PRECAUTIONS: Fall  RED FLAGS: None   WEIGHT BEARING RESTRICTIONS: No  FALLS:  Has patient fallen in last 6 months? Yes. Number of falls 3 -- last fall yesterday getting pizza out of the stove  LIVING ENVIRONMENT: Lives with: lives with their spouse and daughter Lives in: House/apartment Stairs: Yes: Internal: 14 steps; can reach both Has following equipment at home: Vannie - 4 wheeled  OCCUPATION: Retired -- reads, tries to exercise, watches TV  PLOF: Independent  PATIENT GOALS: Return to exercise and walking  NEXT MD VISIT: 12/18/23  OBJECTIVE:  Note: Objective measures were completed at Evaluation unless otherwise noted.  DIAGNOSTIC FINDINGS: x-ray on R foot and ankle 03/25/22: Ankle joint space maintained. Elevated first ray. Midfoot arthritic changes present.   PATIENT SURVEYS:  LEFS: 37/80 = 46.25% EDEMA:  None now  MUSCLE LENGTH: Hamstrings: Right ~80 deg;  Left ~70 deg Thomas test: Right 0 deg; Left 0 deg  POSTURE: rounded shoulders, forward head, increased thoracic kyphosis, and flexed trunk , R knee hyperextends and comes into varus, L knee goes into valgus  PALPATION: No overt tenderness to palpation   LOWER EXTREMITY MMT:  MMT Right eval Left eval  Hip flexion 5 5  Hip extension 3+ 3+  Hip abduction 3 3  Hip adduction    Hip internal rotation    Hip external rotation    Knee flexion 5 4  Knee extension 5 5  Ankle dorsiflexion    Ankle plantarflexion    Ankle inversion    Ankle eversion     (Blank rows = not tested)  LOWER EXTREMITY SPECIAL TESTS:  Did not  assess  FUNCTIONAL TESTS:  5 times sit to stand: 15.1 sec without UEs Timed up and go (TUG): 13.77 sec Berg Balance Scale: 37/56 on 01/05/24  [facility] PT Assessment - 01/05/24 0001       Berg Balance Test   Sit to Stand Able to stand  independently using hands    Standing Unsupported Able to stand 2 minutes with supervision    Sitting with Back Unsupported but Feet Supported on Floor or Stool Able to sit safely and securely 2 minutes    Stand to Sit Sits safely with minimal use of hands    Transfers Able to transfer safely, definite need of hands    Standing Unsupported with Eyes Closed Able to stand 10 seconds with supervision    Standing Unsupported with Feet Together Able to place feet together independently and stand 1 minute safely    From Standing, Reach Forward with Outstretched Arm Can reach forward >12 cm safely (5)    From Standing Position, Pick up Object from Floor Able to pick up shoe, needs supervision    From Standing Position, Turn to Look Behind Over each Shoulder Needs supervision when turning    Turn 360 Degrees Needs close supervision or verbal cueing    Standing Unsupported, Alternately Place Feet on Step/Stool Able to complete 4 steps without aid or supervision    Standing Unsupported, One Foot in Front Able to take small step independently and  hold 30 seconds    Standing on One Leg Tries to lift leg/unable to hold 3 seconds but remains standing independently    Total Score 37            GAIT: Distance walked: Into clinic Assistive device utilized: Walker - 4 wheeled Level of assistance: SBA Comments: R knee hyperextension during stance, increased forward lean, scissors with R LE, decreased heel strike                                                                                                                                TREATMENT DATE:  01/05/24 THERAPEUTIC EXERCISE: To improve strength.  Demonstration, verbal and tactile cues throughout for technique.  Nustep L6 x 8'  PHYSICAL PERFORMANCE TEST or MEASUREMENT: Berg Test administered.  Score:  37/56  NEUROMUSCULAR RE-EDUCATION: To improve balance. Sidestepping at mat table behind knees to prevent hyperextension x 6 laps Sit to Stand no hands x 2/5 Step stance with rocking f/b x 20 BLE Step touch 8 x 10 BLE with min assist for weight shift and knee blocking manually Counter lunges x 10 BLE forward Counter lunges S/S x 10 BLE  01/01/24 THERAPEUTIC EXERCISE: To improve strength and endurance.  Demonstration, verbal and tactile cues throughout for technique. NuStep L5 x 6'  NEUROMUSCULAR RE-EDUCATION: To improve balance, coordination, posture, reduce fall risk, and amplitude of movement. Bent knee sidestepping counter x 3 laps Bent knee F/B gait at counter x 3 laps Bent knee sidestepping back  to counter with   5# BLE x 2 laps Step lunges x 10 BLE  Stagger stance f/b manual wt shifting Decline ramp standing with marching and manual NDT for knee hyperextension prevention x 10 BLE Gait training rollator x 3 gym laps with manual blocking to prevent knee hyperextension with variable manual vs. Vc Alternating step touch on 7 x 10 with cueing to avoid hyperextension  THERAPEUTIC ACTIVITIES: To improve functional performance.  Demonstration, verbal and tactile cues  throughout for technique. Step ups 8 x 10 BLE Lumbar extension at counter x 15 Seated knee extension 5# w/ 3 sec holds  x 20 BLE 5X STS =15 sec.  12/29/23 Nustep L5 x 8 min Hip hinge holding on to counter into glute setting 2x10 Counter plank 3x30 Counter lumbar ext x10 Staggered stance rock fwd/bwd 2x10 focusing on hip extension during stance Standing hip ext 2x10 cues to keep trunk forward Gait training x3 laps CW, x 3 laps CCW with rollator - cues for heel strike with soft knee bend to keep from knee hyperextension transitioning to hip extension during stance phase  12/22/23 Recumbent bike L3 x 8 min Supine feet on peanut pball  Bridging with knees extended 2x10 Hamstring curl into bridge with knees flexed 2x10 Knee extension against black TB 2x10 Knees extended slide them on/off ball 2x10 Standing  Staggered stance rock fwd/bwd x30 no UE assist Gait training x3 laps CW, x 2.25 laps CCW with rollator - cues for heel strike, increased stride slength, and upright posture    12/18/23 Nustep L5 x 6 min Supine bridge 2x10 Upper trunk twist x 10 B SLR 2x10 B Supine hip ADD + ab brace 10x5; 2 set Standing glute sets Standing quad sets/TKE  Gait training - cues for heel strike, increased stride slength, and upright posture  12/15/23 Nustep L7 x 8 min Supine bridge 2x10 Supine hip abd 2x10 Sidelying clamshell red TB 2x10 Seated knee flex/ext red TB 2x10 Seated ankle DF red TB 2x10 Staggered stance glute setting 2x10 Staggered stance static balance with UE support 2x30 Amb x1 lap around gym with 4 wheel walker, focusing on hip extension and keeping chest up and diminishing knee hyperextension   12/08/23 Nustep L5 x 6 min 5x STS =17 sec Heel/toe raises x 20 Retro step x 20 RLE behind Standing LLE toe tap lateral and posterior  x20 Mini squat at counter 10x3; 2 sets Leg curls 20lb 2x10 BLE Leg ext 20lb 2x10 BLE   11/20/23 Nustep L5 x 6 min Standing hip abduction,  extension, marching RTB at thighs 2x10 Retro step x 15 [employer] pews x 15 Seated LAQ 3lb x 10 BLE Seated rows RTB x 20 Seated shoulder RTB x 15  11/12/23 Nustep L5 x 6 min Seated PWR! up red TB around thighs 2x10 Seated sit<>stand red TB around thighs focus on glute set 2x10 Standing PWR! Step back red TB around thighs 2x10 Standing PWR! Side step red TB around thighs 2x10 Standing terminal knee ext red TB focusing on eccentric control to decrease knee hyperextension 3x10 Eccentric sit to stand x10 Amb with rollator, focusing on hip extension and diminishing knee hyperextension with improved heel strike noted x 1 lap around gym    PATIENT EDUCATION:  Education details: Exam findings, POC, initial HEP Person educated: Patient Education method: Explanation, Demonstration, and Handouts Education comprehension: verbalized understanding, returned demonstration, and needs further education  HOME EXERCISE PROGRAM: Access Code: [email or web address] URL: [email or web address] Date: 11/20/2023 Prepared by: Braylin Clark  Exercises -  Seated Hip Abduction with Resistance  - 1 x daily - 7 x weekly - 2 sets - 10 reps - Seated March with Resistance  - 1 x daily - 7 x weekly - 2 sets - 10 reps - Seated Gluteal Sets  - 1 x daily - 7 x weekly - 1 sets - 10 reps - 3 sec hold - Sit to Stand with Resistance Around Legs  - 1 x daily - 7 x weekly - 2 sets - 10 reps - Standing Hip Abduction with Resistance at Ankles and Counter Support  - 1 x daily - 7 x weekly - 2 sets - 10 reps - Standing Hip Extension with Resistance at Ankles and Counter Support  - 1 x daily - 7 x weekly - 2 sets - 10 reps  ASSESSMENT:  CLINICAL IMPRESSION: Patient is highly motivated and works hard with PT.  Berg Score today has slightly declined since last check to 37/56.  Continue to recommend consider RLE AFO and he is going to check into orthotist evaluation order this week.  He still lacks balance without his walker for  any activity and keeps all of his weight on his heels.     Patient has been seen x 2 months PT following a 2nd R THA revision with constrained liner due to multiple THA dislocations.  He has had to cancel a number of his visits due to eye surgery.   HE also has Parkinson's dz which has slowed his recovery time.   However, he is making good progress to goals.  He has not yet attained all of the goals that we have set for him, but has good rehab potential to meet all goals.   5x sit to stand has improved from 22 to 17 sec, and he has met his goal for being able to alternately toe tap on a step without knee hyperextension, but requires vc to do it consistently.   He still has bilateral knee hyperextension with ambulation with his rollator, but he can self correct with vc.   Lars is not checked today, but will be checked next visit.   Discussed orthotist consult for AFO's to help with knee hyperextension.  Recommended patient  f/u with his PCP for F2F referral to orthotist.  PT remains necessary 2x weekly x 4 more weeks for gait, balance, strength, stair negotiation deficits.    Patient is agreeable to this plan of care.  Patient is a 73 y.o. M who was seen today for physical therapy evaluation and treatment s/p L hip constrained liner revision (10/01/23). Pt states his L hip has been dislocated 3x and he has gotten surgery on it 3x. PMH of Parkinson's with prior neck and back surgery. Assessment significant for gross bilat LE weakness, L>R hip tightness in hamstrings/hip flexors and glutes, and high fall risk based on his Berg Balance. Pt will benefit from PT to address these deficits to improve his safety with home and community amb.   OBJECTIVE IMPAIRMENTS: Abnormal gait, decreased activity tolerance, decreased balance, decreased coordination, decreased endurance, decreased mobility, difficulty walking, decreased ROM, decreased strength, hypomobility, increased edema, increased fascial restrictions, increased  muscle spasms, impaired flexibility, impaired tone, improper body mechanics, postural dysfunction, and pain.     GOALS: Goals reviewed with patient? Yes  SHORT TERM GOALS: Target date: 01/16/2024  Pt will be ind with initial HEP Baseline: Goal status: MET 11/20/23  2.  Pt will have improved 5x STS to </=13 sec to demo increased functional  LE strength Baseline:  Goal status: - PROGRESSING 15 seconds 01/01/24    LONG TERM GOALS: Target date:  01/30/2024  Pt will be ind with management and progression of HEP Baseline:  Goal status: MET  2.  Pt will have improved Berg Balance Score to >/=45 to demo decreased fall risk Baseline:  Goal status: IN PROGRESS 37/56 on 01/05/24   3.  Pt will be able to alternate tapping his feet on a step without R knee hyperextension throwing off his balance to demo improving LE Stability for safe stair and curb negotiation Baseline:  Goal status:  MET with v/c  4.  Pt will have improved LEFS score to >/=46/80 to demo MCID Baseline:  Goal status: IN PROGRESS    PLAN:/  PT FREQUENCY: 2x/week  PT DURATION: 8 weeks  PLANNED INTERVENTIONS: [id number]- PT Re-evaluation, 97110-Therapeutic exercises, 97530- Therapeutic activity, 97112- Neuromuscular re-education, 97535- Self Care, [id number]- Manual therapy, [phone]- Gait training, [phone]- Aquatic Therapy, [phone]- Electrical stimulation (unattended), [phone]- Ionotophoresis 4mg /ml Dexamethasone , Patient/Family education, Balance training, Stair training, Taping, Dry Needling, Joint mobilization, Spinal mobilization, Cryotherapy, and Moist heat  PLAN FOR NEXT SESSION: Continue PT for further hip strengthening, neuro reeducation for keeping his weight over his toes in standing, try step ups.  Reassess LEFS next visit to update goals PRN.      Astra Gregg, PT, DPT 01/05/2024, 5:25 PM

## 2024-01-07 ENCOUNTER — Ambulatory Visit: Admitting: Rehabilitation

## 2024-01-07 DIAGNOSIS — M6281 Muscle weakness (generalized): Secondary | ICD-10-CM

## 2024-01-07 DIAGNOSIS — R2681 Unsteadiness on feet: Secondary | ICD-10-CM

## 2024-01-07 DIAGNOSIS — R293 Abnormal posture: Secondary | ICD-10-CM

## 2024-01-07 DIAGNOSIS — R2689 Other abnormalities of gait and mobility: Secondary | ICD-10-CM

## 2024-01-07 DIAGNOSIS — R29818 Other symptoms and signs involving the nervous system: Secondary | ICD-10-CM

## 2024-01-07 NOTE — Therapy (Addendum)
 OUTPATIENT PHYSICAL THERAPY LOWER EXTREMITY RECERTIFICATION    Patient Name: Jim Carson MRN: [id number] DOB:1950-10-06, 73 y.o., male Today's Date: 01/07/2024  END OF SESSION:  PT End of Session - 01/07/24 1327     Visit Number 12    Date for PT Re-Evaluation 01/30/24    Authorization Type Medicare + AARP    PT Start Time 1323    PT Stop Time 1402    PT Time Calculation (min) 39 min    Equipment Utilized During Treatment Gait belt    Activity Tolerance Patient tolerated treatment well    Behavior During Therapy WFL for tasks assessed/performed             Past Medical History:  Diagnosis Date   Abnormal gait    due to parkinson's,  uses cane   Absolute anemia 03/13/2023   Allergic rhinitis    Allergy Spring 1998   Allergic to dustmits, dog hair and roaches.   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 glendia  @[facility]urology   Pneumonia    Sleep apnea    Vitamin B12 deficiency    Vitamin D  deficiency    Wears glasses    Past Surgical History:  Procedure Laterality Date   APPENDECTOMY  07/1964   APPENDECTOMY     CHOLECYSTECTOMY     JOINT REPLACEMENT  March 2019 and again on 09/10/2021   Left hip twice.  2nd time due to 3 dislocations   LAPAROSCOPIC CHOLECYSTECTOMY  01-21-2017   @[facility] , [place]   lower back surgery      POSTERIOR CERVICAL FUSION/FORAMINOTOMY     POSTERIOR FUSION CERVICAL SPINE  08-15-2011   @[facility] , [place]   w/ laminectomy and decompression-- C3 -- T1   POSTERIOR  LAMINECTOMY / DECOMPRESSION LUMBAR SPINE  07-30-2013   @[facility] , [place]   bilateral T11 - 12,  L2 --5   REMOVAL LEFT T1 PARASPINOUS MASS  02-12-2016   @[facility] ,  [place]   desmoid fibromatosis   SHOULDER ARTHROSCOPY WITH ROTATOR CUFF REPAIR Left 12/15/2018   Procedure: SHOULDER ARTHROSCOPY, biceps tenodesis labored Debridement, submacromial decompression;  Surgeon: Gerome Lamar, MD;  Location: [facility];  Service: Orthopedics;  Laterality: Left;  interscalene block   SPINE SURGERY     TOTAL HIP ARTHROPLASTY Left 09/18/2017   @[facility] , [place]   TOTAL HIP REVISION Left 09/10/2021   Procedure: Left hip acetabular versus total hip arthroplasty revision, constrained liner;  Surgeon: Melodi Lerner, MD;  Location: [facility];  Service: Orthopedics;  Laterality: Left;   TOTAL HIP REVISION Left 10/01/2023   Procedure: Left hip constrained liner revision;  Surgeon: Melodi Lerner, MD;  Location: [facility];  Service: Orthopedics;  Laterality: Left;    Patient Active Problem List   Diagnosis Date Noted   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   Thrombocytopenia (HCC) 03/17/2023   Absolute anemia 03/13/2023   Family history of  prostate cancer 03/13/2023   History of elevated PSA 03/13/2023   AV block, 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   Drug induced constipation 12/30/2022   Acquired hypothyroidism 12/30/2022   Primary osteoarthritis of left hip 05/18/2021   PD (Parkinson's disease) (HCC) 05/18/2021   History of gastroesophageal reflux (GERD) 05/18/2021   OSA (obstructive sleep apnea) 05/18/2021   Essential hypertension 05/18/2021    PCP: Abran Jon CROME, MD  REFERRING PROVIDER: Kristian Stabs, PA  REFERRING DIAG: Z47.89: Encounter for other orthopedic aftercare S/P left hip constrained liner revision  THERAPY DIAG:  Muscle weakness (generalized)  Unsteadiness on feet  Other abnormalities of gait and mobility  Abnormal  posture  Other symptoms and signs involving the nervous system  Rationale for Evaluation and Treatment: Rehabilitation  ONSET DATE: 10/01/23 L hip revision  SUBJECTIVE:   SUBJECTIVE STATEMENT: Pt. Reports feels well today.  Denies any falls.  Has neuro evaluation at [facility] tomorrow with neurologist, PT, OT, etc.  He is to speak with them about ordering orthotist consult for AFO.  Pt reports he had a L hip replacement in 2019 and dislocated it 3 times. Redid it 2023, had a fall and jammed it and had to revise it 10/01/23. Was told not to go too heavy over 110 lbs on the leg press. Was told not to cross his legs. Will be seeing ortho 11/11/23 and will further clarify precautions. Pt feels his greatest issue is strength. Has not yet returned to the gym. Has been walking some but wants to walk more. Plans to do water  aerobics. Uses his 4 wheel walker at baseline.  PERTINENT HISTORY: Parkinson's (Last saw PT for this in July 2024), back problems (surgery in neck and low back), R ankle rolls  PAIN:  Are you having pain? No and denies pain  PRECAUTIONS: Fall  RED FLAGS: None   WEIGHT BEARING RESTRICTIONS: No  FALLS:  Has patient fallen in last 6 months? Yes. Number of falls 3 -- last fall yesterday getting pizza out of the stove  LIVING ENVIRONMENT: Lives with: lives with their spouse and daughter Lives in: House/apartment Stairs: Yes: Internal: 14 steps; can reach both Has following equipment at home: Vannie - 4 wheeled  OCCUPATION: Retired -- reads, tries to exercise, watches TV  PLOF: Independent  PATIENT GOALS: Return to exercise and walking  NEXT MD VISIT: 12/18/23  OBJECTIVE:  Note: Objective measures were completed at Evaluation unless otherwise noted.  DIAGNOSTIC FINDINGS: x-ray on R foot and ankle 03/25/22: Ankle joint space maintained. Elevated first ray. Midfoot arthritic changes present.   PATIENT SURVEYS:  LEFS: 37/80 = 46.25% EDEMA:  None now  MUSCLE  LENGTH: Hamstrings: Right ~80 deg; Left ~70 deg Thomas test: Right 0 deg; Left 0 deg  POSTURE: rounded shoulders, forward head, increased thoracic kyphosis, and flexed trunk , R knee hyperextends and comes into varus, L knee goes into valgus  PALPATION: No overt tenderness to palpation   LOWER EXTREMITY MMT:  MMT Right eval Left eval  Hip flexion 5 5  Hip extension 3+ 3+  Hip abduction 3 3  Hip adduction    Hip internal rotation    Hip external rotation    Knee flexion 5 4  Knee extension 5 5  Ankle dorsiflexion    Ankle plantarflexion    Ankle inversion    Ankle eversion     (Blank rows = not tested)  LOWER EXTREMITY SPECIAL TESTS:  Did not assess  FUNCTIONAL  TESTS:  5 times sit to stand: 15.1 sec without UEs Timed up and go (TUG): 13.77 sec Berg Balance Scale: 37/56 on 01/05/24  GAIT: Distance walked: Into clinic Assistive device utilized: Walker - 4 wheeled Level of assistance: SBA Comments: R knee hyperextension during stance, increased forward lean, scissors with R LE, decreased heel strike                                                                                                                                TREATMENT DATE:  01/07/24 THERAPEUTIC EXERCISE: To improve strength.  Demonstration, verbal and tactile cues throughout for technique. Bike L8 x 8'  NEUROMUSCULAR RE-EDUCATION: To improve balance. Sidestepping back to counter and manual facilitation and cueing to prevent knee hyperextension x 6 laps Wall slides x 20 with facilitation to prevent knee hyperextension Lateral Step ups 8 step x 10 BLE  Counter back extension with 10 sec holds x 10  Step touch 8 x 10 BLE with min assist for weight shift and knee blocking manually Counter lunges x 10 BLE forward Step stance x 5' BLE with head turns and cognitive distraction with manual wt shifting Gait around gym with manual facilitation with v/c to prevent r knee hyperextension, and promote spine/hip  ext   01/05/24 THERAPEUTIC EXERCISE: To improve strength.  Demonstration, verbal and tactile cues throughout for technique.  Nustep L6 x 8'  PHYSICAL PERFORMANCE TEST or MEASUREMENT: Berg Test administered.  Score:  37/56  NEUROMUSCULAR RE-EDUCATION: To improve balance. Sidestepping at mat table behind knees to prevent hyperextension x 6 laps Sit to Stand no hands x 2/5 Step stance with rocking f/b x 20 BLE Step touch 8 x 10 BLE with min assist for weight shift and knee blocking manually Counter lunges x 10 BLE forward Counter lunges S/S x 10 BLE  01/01/24 THERAPEUTIC EXERCISE: To improve strength and endurance.  Demonstration, verbal and tactile cues throughout for technique. NuStep L5 x 6'  NEUROMUSCULAR RE-EDUCATION: To improve balance, coordination, posture, reduce fall risk, and amplitude of movement. Bent knee sidestepping counter x 3 laps Bent knee F/B gait at counter x 3 laps Bent knee sidestepping back to counter with   5# BLE x 2 laps Step lunges x 10 BLE  Stagger stance f/b manual wt shifting Decline ramp standing with marching and manual NDT for knee hyperextension prevention x 10 BLE Gait training rollator x 3 gym laps with manual blocking to prevent knee hyperextension with variable manual vs. Vc Alternating step touch on 7 x 10 with cueing to avoid hyperextension  THERAPEUTIC ACTIVITIES: To improve functional performance.  Demonstration, verbal and tactile cues throughout for technique. Step ups 8 x 10 BLE Lumbar extension at counter x 15 Seated knee extension 5# w/ 3 sec holds  x 20 BLE 5X STS =15 sec.  12/29/23 Nustep L5 x 8 min Hip hinge holding on to counter into glute setting 2x10 Counter plank 3x30 Counter lumbar ext  x10 Staggered stance rock fwd/bwd 2x10 focusing on hip extension during stance Standing hip ext 2x10 cues to keep trunk forward Gait training x3 laps CW, x 3 laps CCW with rollator - cues for heel strike with soft knee bend to keep from  knee hyperextension transitioning to hip extension during stance phase  12/22/23 Recumbent bike L3 x 8 min Supine feet on peanut pball  Bridging with knees extended 2x10 Hamstring curl into bridge with knees flexed 2x10 Knee extension against black TB 2x10 Knees extended slide them on/off ball 2x10 Standing  Staggered stance rock fwd/bwd x30 no UE assist Gait training x3 laps CW, x 2.25 laps CCW with rollator - cues for heel strike, increased stride slength, and upright posture    12/18/23 Nustep L5 x 6 min Supine bridge 2x10 Upper trunk twist x 10 B SLR 2x10 B Supine hip ADD + ab brace 10x5; 2 set Standing glute sets Standing quad sets/TKE  Gait training - cues for heel strike, increased stride slength, and upright posture  12/15/23 Nustep L7 x 8 min Supine bridge 2x10 Supine hip abd 2x10 Sidelying clamshell red TB 2x10 Seated knee flex/ext red TB 2x10 Seated ankle DF red TB 2x10 Staggered stance glute setting 2x10 Staggered stance static balance with UE support 2x30 Amb x1 lap around gym with 4 wheel walker, focusing on hip extension and keeping chest up and diminishing knee hyperextension   12/08/23 Nustep L5 x 6 min 5x STS =17 sec Heel/toe raises x 20 Retro step x 20 RLE behind Standing LLE toe tap lateral and posterior  x20 Mini squat at counter 10x3; 2 sets Leg curls 20lb 2x10 BLE Leg ext 20lb 2x10 BLE   11/20/23 Nustep L5 x 6 min Standing hip abduction, extension, marching RTB at thighs 2x10 Retro step x 15 Church pews x 15 Seated LAQ 3lb x 10 BLE Seated rows RTB x 20 Seated shoulder RTB x 15  11/12/23 Nustep L5 x 6 min Seated PWR! up red TB around thighs 2x10 Seated sit<>stand red TB around thighs focus on glute set 2x10 Standing PWR! Step back red TB around thighs 2x10 Standing PWR! Side step red TB around thighs 2x10 Standing terminal knee ext red TB focusing on eccentric control to decrease knee hyperextension 3x10 Eccentric sit to stand x10 Amb  with rollator, focusing on hip extension and diminishing knee hyperextension with improved heel strike noted x 1 lap around gym    PATIENT EDUCATION:  Education details: Exam findings, POC, initial HEP Person educated: Patient Education method: Explanation, Demonstration, and Handouts Education comprehension: verbalized understanding, returned demonstration, and needs further education  HOME EXERCISE PROGRAM: Access Code: [email or web address] URL: [email or web address] Date: 11/20/2023 Prepared by: Braylin Clark  Exercises - Seated Hip Abduction with Resistance  - 1 x daily - 7 x weekly - 2 sets - 10 reps - Seated March with Resistance  - 1 x daily - 7 x weekly - 2 sets - 10 reps - Seated Gluteal Sets  - 1 x daily - 7 x weekly - 1 sets - 10 reps - 3 sec hold - Sit to Stand with Resistance Around Legs  - 1 x daily - 7 x weekly - 2 sets - 10 reps - Standing Hip Abduction with Resistance at Ankles and Counter Support  - 1 x daily - 7 x weekly - 2 sets - 10 reps - Standing Hip Extension with Resistance at Ankles and Counter Support  - 1 x daily - 7 x weekly - 2  sets - 10 reps  ASSESSMENT:  CLINICAL IMPRESSION: Patient is highly motivated and works hard with PT.  Berg Score today has slightly declined since last check to 37/56.  Continue to recommend consider RLE AFO and he is going to check into orthotist evaluation order this week.  He still lacks balance without his walker for any activity and keeps all of his weight on his heels.     Patient has been seen x 2 months PT following a 2nd R THA revision with constrained liner due to multiple THA dislocations.  He has had to cancel a number of his visits due to eye surgery.   HE also has Parkinson's dz which has slowed his recovery time.   However, he is making good progress to goals.  He has not yet attained all of the goals that we have set for him, but has good rehab potential to meet all goals.   5x sit to stand has improved from 22 to  17 sec, and he has met his goal for being able to alternately toe tap on a step without knee hyperextension, but requires vc to do it consistently.   He still has bilateral knee hyperextension with ambulation with his rollator, but he can self correct with vc.   Lars is not checked today, but will be checked next visit.   Discussed orthotist consult for AFO's to help with knee hyperextension.  Recommended patient  f/u with his PCP for F2F referral to orthotist.  PT remains necessary 2x weekly x 4 more weeks for gait, balance, strength, stair negotiation deficits.    Patient is agreeable to this plan of care.  Patient is a 73 y.o. M who was seen today for physical therapy evaluation and treatment s/p L hip constrained liner revision (10/01/23). Pt states his L hip has been dislocated 3x and he has gotten surgery on it 3x. PMH of Parkinson's with prior neck and back surgery. Assessment significant for gross bilat LE weakness, L>R hip tightness in hamstrings/hip flexors and glutes, and high fall risk based on his Berg Balance. Pt will benefit from PT to address these deficits to improve his safety with home and community amb.   OBJECTIVE IMPAIRMENTS: Abnormal gait, decreased activity tolerance, decreased balance, decreased coordination, decreased endurance, decreased mobility, difficulty walking, decreased ROM, decreased strength, hypomobility, increased edema, increased fascial restrictions, increased muscle spasms, impaired flexibility, impaired tone, improper body mechanics, postural dysfunction, and pain.     GOALS: Goals reviewed with patient? Yes  SHORT TERM GOALS: Target date: 01/16/2024  Pt will be ind with initial HEP Baseline: Goal status: MET 11/20/23  2.  Pt will have improved 5x STS to </=13 sec to demo increased functional LE strength Baseline:  Goal status: - PROGRESSING 15 seconds 01/01/24    LONG TERM GOALS: Target date:  01/30/2024  Pt will be ind with management and progression of  HEP Baseline:  Goal status: MET  2.  Pt will have improved Berg Balance Score to >/=45 to demo decreased fall risk Baseline:  Goal status: IN PROGRESS 37/56 on 01/05/24   3.  Pt will be able to alternate tapping his feet on a step without R knee hyperextension throwing off his balance to demo improving LE Stability for safe stair and curb negotiation Baseline:  Goal status:  MET with v/c  4.  Pt will have improved LEFS score to >/=46/80 to demo MCID Baseline: 33/80 on 01/07/24 Goal status: IN PROGRESS    PLAN:/  PT  FREQUENCY: 2x/week  PT DURATION: 8 weeks  PLANNED INTERVENTIONS: 02835- PT Re-evaluation, 97110-Therapeutic exercises, 97530- Therapeutic activity, 97112- Neuromuscular re-education, 97535- Self Care, 02859- Manual therapy, [phone]- Gait training, [phone]- Aquatic Therapy, [phone]- Electrical stimulation (unattended), [phone]- Ionotophoresis 4mg /ml Dexamethasone , Patient/Family education, Balance training, Stair training, Taping, Dry Needling, Joint mobilization, Spinal mobilization, Cryotherapy, and Moist heat  PLAN FOR NEXT SESSION: Continue PT for further hip strengthening, neuro reeducation for keeping his weight over his toes in standing, try step ups.  Reassess LEFS next visit to update goals PRN.      Kellsey Sansone, PT, DPT 01/07/2024, 3:25 PM

## 2024-01-13 ENCOUNTER — Ambulatory Visit

## 2024-01-13 DIAGNOSIS — M6281 Muscle weakness (generalized): Secondary | ICD-10-CM | POA: Diagnosis not present

## 2024-01-13 DIAGNOSIS — R2689 Other abnormalities of gait and mobility: Secondary | ICD-10-CM

## 2024-01-13 DIAGNOSIS — R293 Abnormal posture: Secondary | ICD-10-CM

## 2024-01-13 DIAGNOSIS — R2681 Unsteadiness on feet: Secondary | ICD-10-CM

## 2024-01-13 DIAGNOSIS — R29818 Other symptoms and signs involving the nervous system: Secondary | ICD-10-CM

## 2024-01-13 NOTE — Progress Notes (Unsigned)
 Office Visit Note  Patient: Jim Carson             Date of Birth: Nov 05, 1950           MRN: [id number]             PCP: Abran Jon CROME, MD Referring: Abran Jon CROME, MD Visit Date: 01/21/2024 Occupation: @GUAROCC @  Subjective:  Right CMC joint pain   History of Present Illness: Jim Carson is a 73 y.o. male with history of osteoarthritis and DDD.  Patient was last seen in the office on 04/23/23.  Patient had a right CMC joint cortisone injection on 04/23/2023 along with updated x-rays performed.  Patient presents today with a recurrence of pain in the right [facility] joint.  Her symptoms have been severe for the past 1 week.  He has been having difficulty performing ADLs and has been dropping objects more frequently.  He has a difficulty playing the guitar which has been aggravating.  He currently rates the pain with motion a 6 out of 10 on the pain scale.  He has been using a wrist splint to immobilize his wrist and add support.  He has not been using Voltaren  gel.  He has not been taking tramadol  or Norco.  He experiences some increased discomfort in the left hand as well but is not as severe as the right hand.  He denies any other joint pain or joint swelling at this time.   Activities of Daily Living:  Patient reports morning stiffness for 30 minutes.   Patient Reports nocturnal pain.  Difficulty dressing/grooming: Reports Difficulty climbing stairs: Reports Difficulty getting out of chair: Reports Difficulty using hands for taps, buttons, cutlery, and/or writing: Reports  Review of Systems  Constitutional:  Negative for fatigue.  HENT:  Positive for mouth dryness. Negative for mouth sores.   Eyes:  Positive for dryness.  Respiratory:  Negative for shortness of breath.   Cardiovascular:  Negative for chest pain and palpitations.  Gastrointestinal:  Positive for constipation. Negative for blood in stool and diarrhea.  Endocrine: Positive for increased urination.  Genitourinary:   Negative for involuntary urination.  Musculoskeletal:  Positive for joint pain, gait problem, joint pain, joint swelling, muscle weakness and morning stiffness. Negative for myalgias, muscle tenderness and myalgias.  Skin:  Negative for color change, rash, hair loss and sensitivity to sunlight.  Allergic/Immunologic: Negative for susceptible to infections.  Neurological:  Negative for dizziness and headaches.  Hematological:  Negative for swollen glands.  Psychiatric/Behavioral:  Positive for sleep disturbance. Negative for depressed mood. The patient is not nervous/anxious.     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 glendia  @[facility]   Pneumonia    Sleep apnea    Vitamin B12 deficiency    Vitamin D  deficiency    Wears glasses     Family History  Problem Relation Age of Onset   Heart disease Mother    Alcohol abuse Father    Prostate cancer Brother    Cancer Brother    Past Surgical History:  Procedure Laterality Date   APPENDECTOMY  07/1964   APPENDECTOMY     CHOLECYSTECTOMY     JOINT REPLACEMENT  March 2019 and again on 09/10/2021   Left hip twice.  2nd time due to 3 dislocations   LAPAROSCOPIC CHOLECYSTECTOMY  01-21-2017   @[facility] , [place]   lower back surgery      POSTERIOR CERVICAL FUSION/FORAMINOTOMY     POSTERIOR FUSION CERVICAL SPINE  08-15-2011   @[facility] , [place]   w/ laminectomy and decompression-- C3 -- T1   POSTERIOR LAMINECTOMY / DECOMPRESSION LUMBAR SPINE  07-30-2013   @[facility] , [place]   bilateral T11 - 12,  L2 --5   REMOVAL LEFT T1 PARASPINOUS MASS  02-12-2016   @[facility] ,  [place]   desmoid fibromatosis   SHOULDER ARTHROSCOPY WITH ROTATOR CUFF REPAIR Left 12/15/2018   Procedure: SHOULDER ARTHROSCOPY, biceps tenodesis labored Debridement, submacromial decompression;  Surgeon: Gerome Charleston, MD;  Location: [facility];  Service: Orthopedics;  Laterality: Left;  interscalene block   SPINE SURGERY     TOTAL HIP ARTHROPLASTY Left 09/18/2017   @[facility] , [place]   TOTAL HIP REVISION Left 09/10/2021   Procedure: Left hip acetabular versus total hip arthroplasty revision, constrained liner;  Surgeon: Melodi Lerner, MD;  Location: [facility];  Service: Orthopedics;  Laterality: Left;   TOTAL HIP REVISION Left 10/01/2023   Procedure: Left hip constrained liner revision;  Surgeon: Melodi Lerner, MD;  Location: [facility];  Service: Orthopedics;  Laterality: Left;    Social History   Social History Narrative   ** Merged History Encounter **       Immunization History  Administered Date(s)  Administered   Fluad Quad(high Dose 65+) 03/14/2021   Gamma Globulin 03/08/1993, 07/24/1993, 11/21/1993, 05/20/1994   Influenza Inj Mdck Quad Pf 03/20/2022   Influenza Split 02/22/2019, 02/24/2019   Influenza, High Dose Seasonal PF 04/13/2018, 03/09/2020, 03/14/2021   Influenza,inj,Quad PF,6+ Mos 03/03/2014, 03/24/2015, 04/02/2016, 04/22/2017, 02/24/2019   Influenza,inj,Quad PF,6-35 Mos 02/24/2019   Influenza,inj,quad, With Preservative 02/22/2019   Influenza-Unspecified 03/03/2014, 03/24/2015, 04/02/2016, 04/22/2017, 04/13/2018, 02/22/2019, 02/24/2019   Moderna Covid-19 Fall Seasonal Vaccine 61yrs & older 06/03/2022   PFIZER Comirnaty(Gray Top)Covid-19 Tri-Sucrose Vaccine 10/14/2020, 03/29/2021   PFIZER(Purple Top)SARS-COV-2 Vaccination 07/29/2019, 08/24/2019, 03/27/2020   PNEUMOCOCCAL CONJUGATE-20 03/27/2021   Pneumococcal Conjugate,unspecified 03/03/2014, 03/24/2015   Pneumococcal Conjugate-13 03/03/2014, 03/24/2015   Pneumococcal Polysaccharide-23 03/03/2014, 03/24/2015   Pneumococcal-Unspecified 03/03/2014, 03/24/2015   Polio, Unspecified 07/12/1954, 08/23/1954,  09/27/1955, 11/23/1959   Respiratory Syncytial Virus Vaccine,Recomb Aduvanted(Arexvy) 06/04/2022   Smallpox 10/04/1954   Td 10/04/1954, 03/08/1993   Tdap 12/10/2012, 07/24/2020   Unspecified SARS-COV-2 Vaccination 03/27/2020   Zoster Recombinant(Shingrix) 11/14/2017, 02/26/2018, 02/27/2018, 02/22/2019   Zoster, Live 12/30/2011   Zoster, Unspecified 11/14/2017, 02/26/2018, 02/27/2018, 02/22/2019     Objective: Vital Signs: BP 123/67 (BP Location: Left Arm, Patient Position: Sitting, Cuff Size: Normal)   Pulse (!) 49   Resp 17   Ht 5' 8 (1.727 m)   Wt 191 lb 6.4 oz (86.8 kg)   BMI 29.10 kg/m    Physical Exam Vitals and nursing note reviewed.  Constitutional:      Appearance: He is well-developed.  HENT:     Head: Normocephalic and atraumatic.  Eyes:     Conjunctiva/sclera: Conjunctivae normal.     Pupils:  Pupils are equal, round, and reactive to light.  Cardiovascular:     Rate and Rhythm: Normal rate and regular rhythm.     Heart sounds: Normal heart sounds.  Pulmonary:     Effort: Pulmonary effort is normal.     Breath sounds: Normal breath sounds.  Abdominal:     General: Bowel sounds are normal.     Palpations: Abdomen is soft.  Musculoskeletal:     Cervical back: Normal range of motion and neck supple.  Skin:    General: Skin is warm and dry.     Capillary Refill: Capillary refill takes less than 2 seconds.  Neurological:     Mental Status: He is alert and oriented to person, place, and time.  Psychiatric:        Behavior: Behavior normal.      Musculoskeletal Exam: Patient remained seated during the examination today.  C-spine has limited lateral rotation as well as extension and flexion.  Thoracic kyphosis noted.  Shoulder joints have good range of motion with no discomfort.  Right elbow flexion contracture.  Slightly limited extension of the left elbow.  Thickening and tenderness over both CMC joints, right more severe than left.  PIP and DIP thickening consistent with osteoarthritis of both hands.  No tenderness or synovitis over MCP joints.  Stiffness and discomfort with fist formation bilaterally.  Knee joints have good range of motion no warmth or effusion.  Ankle joints have good range of motion with no tenderness.  Pedal edema noted bilateral lower extremities.  CDAI Exam: CDAI Score: -- Patient Global: --; Provider Global: -- Swollen: --; Tender: -- Joint Exam 01/21/2024   No joint exam has been documented for this visit   There is currently no information documented on the homunculus. Go to the Rheumatology activity and complete the homunculus joint exam.  Investigation: No additional findings.  Imaging: US  Guided Needle Placement Result Date: 01/21/2024 Ultrasound guided injection is preferred based studies that show increased duration, increased effect, greater  accuracy, decreased procedural pain, increased response rate, and decreased cost with ultrasound guided versus blind injection.   Verbal informed consent obtained.  Time-out conducted.  Noted no overlying erythema, induration, or other signs of local infection. Ultrasound-guided CMC injection: After sterile prep with Betadine , injected 0.5 mL of 1% lidocaine  and 20 mg Kenalog  using a 27-gauge needle, in the [facility] joint.     Recent Labs: Lab Results  Component Value Date   WBC 10.0 10/02/2023   HGB 13.1 10/02/2023   PLT 153 10/02/2023   NA 139 10/02/2023   K 3.8 10/02/2023   CL 106 10/02/2023   CO2 24 10/02/2023  GLUCOSE 124 (H) 10/02/2023   BUN 16 10/02/2023   CREATININE 0.82 10/02/2023   BILITOT 1.1 06/23/2023   ALKPHOS 68 06/23/2023   AST 24 06/23/2023   ALT 9 06/23/2023   PROT 8.0 06/23/2023   ALBUMIN 4.8 06/23/2023   CALCIUM 9.0 10/02/2023   GFRAA >60 12/15/2018    Speciality Comments: No specialty comments available.  Procedures:  Small Joint Inj: R thumb CMC on 01/21/2024 3:28 PM Details: 27 G needle, ultrasound-guided radial approach  Spinal Needle: No  Medications: 20 mg triamcinolone  acetonide 40 MG/ML; 0.5 mL lidocaine  1 % Aspirate: 0 mL Outcome: tolerated well, no immediate complications  Risk of infection, tendon injury, nerve injury, dermal atrophy, and hypopigmentation were discussed. Procedure, treatment alternatives, risks and benefits explained, specific risks discussed. Consent was given by the patient. Immediately prior to procedure a time out was called to verify the correct patient, procedure, equipment, support staff and site/side marked as required. Patient was prepped and draped in the usual sterile fashion.     Allergies: Other   Assessment / Plan:     Visit Diagnoses: Primary osteoarthritis of both hands - Clinical and radiographic findings were consistent with osteoarthritis.    All autoimmune work-up negative in the past.  He presents today with  a recurrence of pain in the right [facility] joint.  He had a right CMC joint injection performed on 04/23/2023 for which provided significant relief but his symptoms have recurred.  After informed consent the right [facility] joint was injected with cortisone under ultrasound guidance and the procedure note was completed above.  Aftercare was discussed.  He was given a prescription for a right CMC joint brace.- Plan: Small Joint Inj: R thumb CMC  Pain in both hands - X-rays of both hands updated on 04/23/2023 were suggestive of osteoarthritis.  He has severe CMC, PIP, DIP thickening.  No MCP or intercarpal narrowing noted.  No radiographic progression was noted when compared to x-rays from 2022.   Lab work from 05/18/2021 was reviewed today in the office: Anti-CCP negative, uric acid within normal limits, RF negative, ESR within normal limits.  Low suspicion for rheumatoid arthritis at this time.  No synovitis was noted.  He is experiencing significant discomfort in the right [facility] joint due to underlying osteoarthritis.  Different treatment options were discussed and the right [facility] joint was injected with cortisone under ultrasound guidance.  He tolerated the procedure well and the procedure note was completed above.  The patient and his wife would like to have updated lab work today--the following labs were released today Plan: Rheumatoid factor, Sedimentation rate, C-reactive protein, Cyclic citrul peptide antibody, IgG, Mutated Citrullinated Vimentin (MCV) Antibody  Arthritis of carpometacarpal (CMC) joint of right thumb - He presents today with a recurrence of pain in the right CMC joint.  His symptoms have been severe for the past 1 week.  Patient was doing exercises on the floor and had to push off using his hands to rise to a standing position.  Patient states that later that night he had 9 out of 10 pain in his right hand.  He has been wearing a wrist splint for support. He has been having difficulty gripping and  fine motor due to the severity of pain in his right CMC joint.  Patient currently rates the pain a 6 out of 10 with motion.  On examination he has thickening and tenderness of the right CMC joint.  Previous x-rays and labs were reviewed.  Different treatment  options were discussed.  An ultrasound-guided right [facility] joint injection was performed today. Procedure note was completed above.  Aftercare was discussed.  He was advised to notify us  if his symptoms persist or worsen. Patient was given a prescription for a right CMC joint brace as well.  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  DDD (degenerative disc disease), cervical - Status post fusion 2013. Limited ROM with lateral rotation and flexion and extension.   Spondylosis of lumbar spine - He had an epidural injection about a month ago.  Status post discectomy in 2015 for spinal stenosis.  History of right lower extremity numbness. Using a rollator walker to assist with ambulation.    Other medical conditions are listed as follows:  Keratoconjunctivitis sicca of both eyes: Chronic dry eyes.  History of Parkinson disease  Essential hypertension: Blood pressure was 123/67 today in the office.  History of gastroesophageal reflux (GERD)  History of hypothyroidism  Vitamin D  deficiency  Vitamin B12 deficiency  OSA (obstructive sleep apnea)    Orders: Orders Placed This Encounter  Procedures   Small Joint Inj: R thumb CMC   US  Guided Needle Placement   Rheumatoid factor   Sedimentation rate   C-reactive protein   Cyclic citrul peptide antibody, IgG   Mutated Citrullinated Vimentin (MCV) Antibody   No orders of the defined types were placed in this encounter.   Follow-Up Instructions: Return in about 6 months (around 07/23/2024) for Osteoarthritis.   Jim CHRISTELLA Craze, PA-C  Note - This record has been created using Dragon software.  Chart creation  errors have been sought, but may not always  have been located. Such creation errors do not reflect on  the standard of medical care.

## 2024-01-13 NOTE — Therapy (Signed)
 OUTPATIENT PHYSICAL THERAPY LOWER EXTREMITY TREATMENT    Patient Name: Jim Carson MRN: [id number] DOB:05-07-1951, 73 y.o., male Today's Date: 01/13/2024  END OF SESSION:  PT End of Session - 01/13/24 1322     Visit Number 13    Date for PT Re-Evaluation 01/30/24    Authorization Type Medicare + AARP    PT Start Time 1315    PT Stop Time 1401    PT Time Calculation (min) 46 min    Equipment Utilized During Treatment --    Activity Tolerance Patient tolerated treatment well    Behavior During Therapy WFL for tasks assessed/performed              Past Medical History:  Diagnosis Date   Abnormal gait    due to parkinson's,  uses cane   Absolute anemia 03/13/2023   Allergic rhinitis    Allergy Spring 1998   Allergic to dustmits, dog hair and roaches.   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 glendia  @[facility]   Pneumonia    Sleep apnea    Vitamin B12 deficiency    Vitamin D  deficiency    Wears glasses    Past Surgical History:  Procedure Laterality Date   APPENDECTOMY  07/1964   APPENDECTOMY     CHOLECYSTECTOMY     JOINT REPLACEMENT  March 2019 and again on 09/10/2021   Left hip twice.  2nd time due to 3 dislocations   LAPAROSCOPIC CHOLECYSTECTOMY  01-21-2017   @[facility] , [place]   lower back surgery      POSTERIOR CERVICAL FUSION/FORAMINOTOMY     POSTERIOR FUSION CERVICAL SPINE  08-15-2011   @[facility] , [place]   w/ laminectomy and decompression-- C3 -- T1   POSTERIOR  LAMINECTOMY / DECOMPRESSION LUMBAR SPINE  07-30-2013   @[facility] , [place]   bilateral T11 - 12,  L2 --5   REMOVAL LEFT T1 PARASPINOUS MASS  02-12-2016   @[facility] ,  [place]   desmoid fibromatosis   SHOULDER ARTHROSCOPY WITH ROTATOR CUFF REPAIR Left 12/15/2018   Procedure: SHOULDER ARTHROSCOPY, biceps tenodesis labored Debridement, submacromial decompression;  Surgeon: Gerome Lamar, MD;  Location: [facility];  Service: Orthopedics;  Laterality: Left;  interscalene block   SPINE SURGERY     TOTAL HIP ARTHROPLASTY Left 09/18/2017   @[facility] , [place]   TOTAL HIP REVISION Left 09/10/2021   Procedure: Left hip acetabular versus total hip arthroplasty revision, constrained liner;  Surgeon: Melodi Lerner, MD;  Location: [facility];  Service: Orthopedics;  Laterality: Left;   TOTAL HIP REVISION Left 10/01/2023   Procedure: Left hip constrained liner revision;  Surgeon: Melodi Lerner, MD;  Location: [facility];  Service: Orthopedics;  Laterality: Left;    Patient Active Problem List   Diagnosis Date Noted   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   Thrombocytopenia (HCC) 03/17/2023   Absolute anemia 03/13/2023   Family history of  prostate cancer 03/13/2023   History of elevated PSA 03/13/2023   AV block, 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   Drug induced constipation 12/30/2022   Acquired hypothyroidism 12/30/2022   Primary osteoarthritis of left hip 05/18/2021   PD (Parkinson's disease) (HCC) 05/18/2021   History of gastroesophageal reflux (GERD) 05/18/2021   OSA (obstructive sleep apnea) 05/18/2021   Essential hypertension 05/18/2021    PCP: Abran Jon CROME, MD  REFERRING PROVIDER: Kristian Stabs, PA  REFERRING DIAG: Z47.89: Encounter for other orthopedic aftercare S/P left hip constrained liner revision  THERAPY DIAG:  Muscle weakness (generalized)  Unsteadiness on feet  Other abnormalities of gait and mobility  Abnormal  posture  Other symptoms and signs involving the nervous system  Rationale for Evaluation and Treatment: Rehabilitation  ONSET DATE: 10/01/23 L hip revision  SUBJECTIVE:   SUBJECTIVE STATEMENT: Pt reports his doctor wrote in a prescription for AFO brace.   Pt reports he had a L hip replacement in 2019 and dislocated it 3 times. Redid it 2023, had a fall and jammed it and had to revise it 10/01/23. Was told not to go too heavy over 110 lbs on the leg press. Was told not to cross his legs. Will be seeing ortho 11/11/23 and will further clarify precautions. Pt feels his greatest issue is strength. Has not yet returned to the gym. Has been walking some but wants to walk more. Plans to do water  aerobics. Uses his 4 wheel walker at baseline.  PERTINENT HISTORY: Parkinson's (Last saw PT for this in July 2024), back problems (surgery in neck and low back), R ankle rolls  PAIN:  Are you having pain? No and denies pain  PRECAUTIONS: Fall  RED FLAGS: None   WEIGHT BEARING RESTRICTIONS: No  FALLS:  Has patient fallen in last 6 months? Yes. Number of falls 3 -- last fall yesterday getting pizza out of the stove  LIVING ENVIRONMENT: Lives with: lives with their spouse and daughter Lives in: House/apartment Stairs: Yes: Internal: 14 steps; can reach both Has following equipment at home: Vannie - 4 wheeled  OCCUPATION: Retired -- reads, tries to exercise, watches TV  PLOF: Independent  PATIENT GOALS: Return to exercise and walking  NEXT MD VISIT: 12/18/23  OBJECTIVE:  Note: Objective measures were completed at Evaluation unless otherwise noted.  DIAGNOSTIC FINDINGS: x-ray on R foot and ankle 03/25/22: Ankle joint space maintained. Elevated first ray. Midfoot arthritic changes present.   PATIENT SURVEYS:  LEFS: 37/80 = 46.25% EDEMA:  None now  MUSCLE LENGTH: Hamstrings: Right ~80 deg; Left ~70 deg Thomas test: Right 0 deg; Left 0 deg  POSTURE: rounded shoulders, forward head,  increased thoracic kyphosis, and flexed trunk , R knee hyperextends and comes into varus, L knee goes into valgus  PALPATION: No overt tenderness to palpation   LOWER EXTREMITY MMT:  MMT Right eval Left eval  Hip flexion 5 5  Hip extension 3+ 3+  Hip abduction 3 3  Hip adduction    Hip internal rotation    Hip external rotation    Knee flexion 5 4  Knee extension 5 5  Ankle dorsiflexion    Ankle plantarflexion    Ankle inversion    Ankle eversion     (Blank rows = not tested)  LOWER EXTREMITY SPECIAL TESTS:  Did not assess  FUNCTIONAL TESTS:  5 times sit to stand: 15.1 sec without UEs Timed up and go (TUG): 13.77 sec Berg Balance Scale: 37/56  on 01/05/24  GAIT: Distance walked: Into clinic Assistive device utilized: Walker - 4 wheeled Level of assistance: SBA Comments: R knee hyperextension during stance, increased forward lean, scissors with R LE, decreased heel strike                                                                                                                                TREATMENT DATE:  01/13/24 NEUROMUSCULAR RE-EDUCATION: To improve coordination, kinesthesia, posture, and proprioception. Mini squat x 20 Fwd step R/L x 20 Fwd stepping over pool noodle x 20 Lateral step over pool noodle x 20 Lateral lunges x 10 B Fwd walking at counter trying to avoid R genu recurvatum Walking around clinic  THERAPEUTIC EXERCISE: To improve strength, endurance, ROM, and flexibility.  Bike L4x41min  GAIT TRAINING: To normalize gait pattern and improve safety.  THERAPEUTIC ACTIVITIES: To improve functional performance.  MANUAL THERAPY: To promote normalized muscle tension, improve joint mobility and/or for pain modulation  01/07/24 THERAPEUTIC EXERCISE: To improve strength.  Demonstration, verbal and tactile cues throughout for technique. Bike L8 x 8'  NEUROMUSCULAR RE-EDUCATION: To improve balance. Sidestepping back to counter and manual facilitation and  cueing to prevent knee hyperextension x 6 laps Wall slides x 20 with facilitation to prevent knee hyperextension Lateral Step ups 8 step x 10 BLE  Counter back extension with 10 sec holds x 10  Step touch 8 x 10 BLE with min assist for weight shift and knee blocking manually Counter lunges x 10 BLE forward Step stance x 5' BLE with head turns and cognitive distraction with manual wt shifting Gait around gym with manual facilitation with v/c to prevent r knee hyperextension, and promote spine/hip ext   01/05/24 THERAPEUTIC EXERCISE: To improve strength.  Demonstration, verbal and tactile cues throughout for technique.  Nustep L6 x 8'  PHYSICAL PERFORMANCE TEST or MEASUREMENT: Berg Test administered.  Score:  37/56  NEUROMUSCULAR RE-EDUCATION: To improve balance. Sidestepping at mat table behind knees to prevent hyperextension x 6 laps Sit to Stand no hands x 2/5 Step stance with rocking f/b x 20 BLE Step touch 8 x 10 BLE with min assist for weight shift and knee blocking manually Counter lunges x 10 BLE forward Counter lunges S/S x 10 BLE  01/01/24 THERAPEUTIC EXERCISE: To improve strength and endurance.  Demonstration, verbal and tactile cues throughout for technique. NuStep L5 x 6'  NEUROMUSCULAR RE-EDUCATION: To improve balance, coordination, posture, reduce fall risk, and amplitude of movement. Bent knee sidestepping counter x 3 laps Bent knee F/B gait at counter x 3 laps Bent knee sidestepping back to counter with   5# BLE x 2 laps Step lunges x 10 BLE  Stagger stance f/b manual wt shifting Decline ramp standing with marching and manual NDT for knee hyperextension prevention x 10 BLE Gait training rollator x 3 gym laps with manual blocking to prevent knee hyperextension with variable manual vs. Vc Alternating step touch on  7 x 10 with cueing to avoid hyperextension  THERAPEUTIC ACTIVITIES: To improve functional performance.  Demonstration, verbal and tactile cues  throughout for technique. Step ups 8 x 10 BLE Lumbar extension at counter x 15 Seated knee extension 5# w/ 3 sec holds  x 20 BLE 5X STS =15 sec.  12/29/23 Nustep L5 x 8 min Hip hinge holding on to counter into glute setting 2x10 Counter plank 3x30 Counter lumbar ext x10 Staggered stance rock fwd/bwd 2x10 focusing on hip extension during stance Standing hip ext 2x10 cues to keep trunk forward Gait training x3 laps CW, x 3 laps CCW with rollator - cues for heel strike with soft knee bend to keep from knee hyperextension transitioning to hip extension during stance phase  12/22/23 Recumbent bike L3 x 8 min Supine feet on peanut pball  Bridging with knees extended 2x10 Hamstring curl into bridge with knees flexed 2x10 Knee extension against black TB 2x10 Knees extended slide them on/off ball 2x10 Standing  Staggered stance rock fwd/bwd x30 no UE assist Gait training x3 laps CW, x 2.25 laps CCW with rollator - cues for heel strike, increased stride slength, and upright posture    12/18/23 Nustep L5 x 6 min Supine bridge 2x10 Upper trunk twist x 10 B SLR 2x10 B Supine hip ADD + ab brace 10x5; 2 set Standing glute sets Standing quad sets/TKE  Gait training - cues for heel strike, increased stride slength, and upright posture  12/15/23 Nustep L7 x 8 min Supine bridge 2x10 Supine hip abd 2x10 Sidelying clamshell red TB 2x10 Seated knee flex/ext red TB 2x10 Seated ankle DF red TB 2x10 Staggered stance glute setting 2x10 Staggered stance static balance with UE support 2x30 Amb x1 lap around gym with 4 wheel walker, focusing on hip extension and keeping chest up and diminishing knee hyperextension   12/08/23 Nustep L5 x 6 min 5x STS =17 sec Heel/toe raises x 20 Retro step x 20 RLE behind Standing LLE toe tap lateral and posterior  x20 Mini squat at counter 10x3; 2 sets Leg curls 20lb 2x10 BLE Leg ext 20lb 2x10 BLE   11/20/23 Nustep L5 x 6 min Standing hip abduction,  extension, marching RTB at thighs 2x10 Retro step x 15 Church pews x 15 Seated LAQ 3lb x 10 BLE Seated rows RTB x 20 Seated shoulder RTB x 15  11/12/23 Nustep L5 x 6 min Seated PWR! up red TB around thighs 2x10 Seated sit<>stand red TB around thighs focus on glute set 2x10 Standing PWR! Step back red TB around thighs 2x10 Standing PWR! Side step red TB around thighs 2x10 Standing terminal knee ext red TB focusing on eccentric control to decrease knee hyperextension 3x10 Eccentric sit to stand x10 Amb with rollator, focusing on hip extension and diminishing knee hyperextension with improved heel strike noted x 1 lap around gym    PATIENT EDUCATION:  Education details: Exam findings, POC, initial HEP Person educated: Patient Education method: Explanation, Demonstration, and Handouts Education comprehension: verbalized understanding, returned demonstration, and needs further education  HOME EXERCISE PROGRAM: Access Code: [email or web address] URL: [email or web address] Date: 11/20/2023 Prepared by: Aunisty Reali  Exercises - Seated Hip Abduction with Resistance  - 1 x daily - 7 x weekly - 2 sets - 10 reps - Seated March with Resistance  - 1 x daily - 7 x weekly - 2 sets - 10 reps - Seated Gluteal Sets  - 1 x daily - 7 x weekly - 1 sets - 10 reps -  3 sec hold - Sit to Stand with Resistance Around Legs  - 1 x daily - 7 x weekly - 2 sets - 10 reps - Standing Hip Abduction with Resistance at Ankles and Counter Support  - 1 x daily - 7 x weekly - 2 sets - 10 reps - Standing Hip Extension with Resistance at Ankles and Counter Support  - 1 x daily - 7 x weekly - 2 sets - 10 reps  ASSESSMENT:  CLINICAL IMPRESSION: Progressed balance and interventions to improve heel strike and avoid locking out on knees with gait. Pt able to avoid this when thinking about it and with cues but when not thinking about it he does lock it .  Patient has been seen x 2 months PT following a 2nd R THA  revision with constrained liner due to multiple THA dislocations.  He has had to cancel a number of his visits due to eye surgery.   HE also has Parkinson's dz which has slowed his recovery time.   However, he is making good progress to goals.  He has not yet attained all of the goals that we have set for him, but has good rehab potential to meet all goals.   5x sit to stand has improved from 22 to 17 sec, and he has met his goal for being able to alternately toe tap on a step without knee hyperextension, but requires vc to do it consistently.   He still has bilateral knee hyperextension with ambulation with his rollator, but he can self correct with vc.   Lars is not checked today, but will be checked next visit.   Discussed orthotist consult for AFO's to help with knee hyperextension.  Recommended patient  f/u with his PCP for F2F referral to orthotist.  PT remains necessary 2x weekly x 4 more weeks for gait, balance, strength, stair negotiation deficits.    Patient is agreeable to this plan of care.  Patient is a 73 y.o. M who was seen today for physical therapy evaluation and treatment s/p L hip constrained liner revision (10/01/23). Pt states his L hip has been dislocated 3x and he has gotten surgery on it 3x. PMH of Parkinson's with prior neck and back surgery. Assessment significant for gross bilat LE weakness, L>R hip tightness in hamstrings/hip flexors and glutes, and high fall risk based on his Berg Balance. Pt will benefit from PT to address these deficits to improve his safety with home and community amb.   OBJECTIVE IMPAIRMENTS: Abnormal gait, decreased activity tolerance, decreased balance, decreased coordination, decreased endurance, decreased mobility, difficulty walking, decreased ROM, decreased strength, hypomobility, increased edema, increased fascial restrictions, increased muscle spasms, impaired flexibility, impaired tone, improper body mechanics, postural dysfunction, and pain.      GOALS: Goals reviewed with patient? Yes  SHORT TERM GOALS: Target date: 01/16/2024  Pt will be ind with initial HEP Baseline: Goal status: MET 11/20/23  2.  Pt will have improved 5x STS to </=13 sec to demo increased functional LE strength Baseline:  Goal status: - PROGRESSING 15 seconds 01/01/24    LONG TERM GOALS: Target date:  01/30/2024  Pt will be ind with management and progression of HEP Baseline:  Goal status: MET  2.  Pt will have improved Berg Balance Score to >/=45 to demo decreased fall risk Baseline:  Goal status: IN PROGRESS 37/56 on 01/05/24   3.  Pt will be able to alternate tapping his feet on a step without R knee hyperextension throwing  off his balance to demo improving LE Stability for safe stair and curb negotiation Baseline:  Goal status:  MET with v/c  4.  Pt will have improved LEFS score to >/=46/80 to demo MCID Baseline: 33/80 on 01/07/24 Goal status: IN PROGRESS    PLAN:/  PT FREQUENCY: 2x/week  PT DURATION: 8 weeks  PLANNED INTERVENTIONS: [id number]- PT Re-evaluation, 97110-Therapeutic exercises, 97530- Therapeutic activity, 97112- Neuromuscular re-education, 97535- Self Care, [id number]- Manual therapy, [phone]- Gait training, [phone]- Aquatic Therapy, [phone]- Electrical stimulation (unattended), [phone]- Ionotophoresis 4mg /ml Dexamethasone , Patient/Family education, Balance training, Stair training, Taping, Dry Needling, Joint mobilization, Spinal mobilization, Cryotherapy, and Moist heat  PLAN FOR NEXT SESSION: Continue PT for further hip strengthening, neuro reeducation for keeping his weight over his toes in standing, try step ups.  Reassess LEFS next visit to update goals PRN.      Kaitlyne Friedhoff L Jaxon Flatt, PTA 01/13/2024, 2:23 PM

## 2024-01-15 ENCOUNTER — Encounter: Payer: Self-pay | Admitting: Rehabilitation

## 2024-01-15 ENCOUNTER — Ambulatory Visit: Admitting: Rehabilitation

## 2024-01-15 DIAGNOSIS — M6281 Muscle weakness (generalized): Secondary | ICD-10-CM | POA: Diagnosis not present

## 2024-01-15 DIAGNOSIS — R2681 Unsteadiness on feet: Secondary | ICD-10-CM

## 2024-01-15 DIAGNOSIS — R2689 Other abnormalities of gait and mobility: Secondary | ICD-10-CM

## 2024-01-15 NOTE — Therapy (Signed)
 OUTPATIENT PHYSICAL THERAPY LOWER EXTREMITY TREATMENT    Patient Name: Jim Carson MRN: [id number] DOB:01-Apr-1951, 73 y.o., male Today's Date: 01/15/2024  END OF SESSION:  PT End of Session - 01/15/24 1323     Visit Number 14    Date for PT Re-Evaluation 01/30/24    PT Start Time 1317    PT Stop Time 1410    PT Time Calculation (min) 53 min    Equipment Utilized During Treatment Gait belt    Activity Tolerance Patient tolerated treatment well;No increased pain    Behavior During Therapy Jim Carson for tasks assessed/performed               Past Medical History:  Diagnosis Date   Abnormal gait    due to parkinson's,  uses cane   Absolute anemia 03/13/2023   Allergic rhinitis    Allergy Spring 1998   Allergic to dustmits, dog hair and roaches.   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 glendia  @[facility]   Pneumonia    Sleep apnea    Vitamin B12 deficiency    Vitamin D  deficiency    Wears glasses    Past Surgical History:  Procedure Laterality Date   APPENDECTOMY  07/1964   APPENDECTOMY     CHOLECYSTECTOMY     JOINT REPLACEMENT  March 2019 and again on 09/10/2021   Left hip twice.  2nd time due to 3 dislocations   LAPAROSCOPIC CHOLECYSTECTOMY  01-21-2017   @[facility] , [place]   lower back surgery      POSTERIOR CERVICAL FUSION/FORAMINOTOMY     POSTERIOR FUSION CERVICAL SPINE  08-15-2011   @[facility] , [place]   w/ laminectomy and decompression-- C3 -- T1   POSTERIOR LAMINECTOMY /  DECOMPRESSION LUMBAR SPINE  07-30-2013   @[facility] ,    bilateral T11 - 12,  L2 --5   REMOVAL LEFT T1 PARASPINOUS MASS  02-12-2016   @[facility] ,  [place]   desmoid fibromatosis   SHOULDER ARTHROSCOPY WITH ROTATOR CUFF REPAIR Left 12/15/2018   Procedure: SHOULDER ARTHROSCOPY, biceps tenodesis labored Debridement, submacromial decompression;  Surgeon: Jim Lamar, MD;  [facility]: [facility];  Service: Orthopedics;  Laterality: Left;  interscalene block   SPINE SURGERY     TOTAL HIP ARTHROPLASTY Left 09/18/2017   @[facility] , [place]   TOTAL HIP REVISION Left 09/10/2021   Procedure: Left hip acetabular versus total hip arthroplasty revision, constrained liner;  Surgeon: Jim Lerner, MD;  Location: [facility];  Service: Orthopedics;  Laterality: Left;   TOTAL HIP REVISION Left 10/01/2023   Procedure: Left hip constrained liner revision;  Surgeon: Jim Lerner, MD;  Location: [facility];  Service: Orthopedics;  Laterality: Left;    Patient Active Problem List   Diagnosis Date Noted   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   Thrombocytopenia (HCC) 03/17/2023   Absolute anemia 03/13/2023   Family history of prostate cancer 03/13/2023  History of elevated PSA 03/13/2023   AV block, 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   Drug induced constipation 12/30/2022   Acquired hypothyroidism 12/30/2022   Primary osteoarthritis of left hip 05/18/2021   PD (Parkinson's disease) (HCC) 05/18/2021   History of gastroesophageal reflux (GERD) 05/18/2021   OSA (obstructive sleep apnea) 05/18/2021   Essential hypertension 05/18/2021    PCP: Jim Jon CROME, MD  REFERRING PROVIDER: Kristian Stabs, PA  REFERRING DIAG: Z47.89: Encounter for other orthopedic aftercare S/P left hip constrained liner revision  THERAPY DIAG:  Muscle weakness (generalized)  Unsteadiness on feet  Other abnormalities of gait and mobility  Rationale for Evaluation  and Treatment: Rehabilitation  ONSET DATE: 10/01/23 L hip revision  SUBJECTIVE:   SUBJECTIVE STATEMENT: Reports reports feels ok.  Denies any falls since last visit.   He has not yet made an appointment with [facility].  HE is encouraged to do so and is given the phone number to [facility] to take home and schedule.  Pt reports he had a L hip replacement in 2019 and dislocated it 3 times. Redid it 2023, had a fall and jammed it and had to revise it 10/01/23. Was told not to go too heavy over 110 lbs on the leg press. Was told not to cross his legs. Will be seeing ortho 11/11/23 and will further clarify precautions. Pt feels his greatest issue is strength. Has not yet returned to the gym. Has been walking some but wants to walk more. Plans to do water  aerobics. Uses his 4 wheel walker at baseline.  PERTINENT HISTORY: Parkinson's (Last saw PT for this in July 2024), back problems (surgery in neck and low back), R ankle rolls  PAIN:  Are you having pain? No and denies pain  PRECAUTIONS: Fall  RED FLAGS: None   WEIGHT BEARING RESTRICTIONS: No  FALLS:  Has patient fallen in last 6 months? Yes. Number of falls 3 -- last fall yesterday getting pizza out of the stove  LIVING ENVIRONMENT: Lives with: lives with their spouse and daughter Lives in: House/apartment Stairs: Yes: Internal: 14 steps; can reach both Has following equipment at home: Vannie - 4 wheeled  OCCUPATION: Retired -- reads, tries to exercise, watches TV  PLOF: Independent  PATIENT GOALS: Return to exercise and walking  NEXT MD VISIT: 12/18/23  OBJECTIVE:  Note: Objective measures were completed at Evaluation unless otherwise noted.  DIAGNOSTIC FINDINGS: x-ray on R foot and ankle 03/25/22: Ankle joint space maintained. Elevated first ray. Midfoot arthritic changes present.   PATIENT SURVEYS:  LEFS: 37/80 = 46.25% EDEMA:  None now  MUSCLE LENGTH: Hamstrings: Right ~80 deg; Left ~70 deg Thomas test: Right 0  deg; Left 0 deg  POSTURE: rounded shoulders, forward head, increased thoracic kyphosis, and flexed trunk , R knee hyperextends and comes into varus, L knee goes into valgus  PALPATION: No overt tenderness to palpation   LOWER EXTREMITY MMT:  MMT Right eval Left eval  Hip flexion 5 5  Hip extension 3+ 3+  Hip abduction 3 3  Hip adduction    Hip internal rotation    Hip external rotation    Knee flexion 5 4  Knee extension 5 5  Ankle dorsiflexion    Ankle plantarflexion    Ankle inversion    Ankle eversion     (Blank rows = not tested)  LOWER EXTREMITY SPECIAL TESTS:  Did not assess  FUNCTIONAL TESTS:  5 times sit to stand:  15.1 sec without UEs Timed up and go (TUG): 13.77 sec Berg Balance Scale: 37/56 on 01/05/24  GAIT: Distance walked: Into clinic Assistive device utilized: Walker - 4 wheeled Level of assistance: SBA Comments: R knee hyperextension during stance, increased forward lean, scissors with R LE, decreased heel strike                                                                                                                                TREATMENT DATE:  01/15/24 THERAPEUTIC EXERCISE: To improve strength.  Demonstration, verbal and tactile cues throughout for technique. Bike L4 x 8'  NEUROMUSCULAR RE-EDUCATION: To improve balance. F/B gait w/ single UE support at counter x 5 laps with intermittent manual L knee cues Sidestep lunges at up/down counter x 5 laps BOSU lunging F/B in step stance x 15 BLE Foam standing w/ marching x 20 BLE Stepping over poodle noodle F/B X 20 and S/S 20  Lateral step ups aerobic step x 20 BLE  THERAPEUTIC ACTIVITIES: To improve functional performance.  Demonstration, verbal and tactile cues throughout for technique. Counter lumbar extension x 20 Sitting in chair and scooting the chair forward x 8'  01/13/24 NEUROMUSCULAR RE-EDUCATION: To improve coordination, kinesthesia, posture, and proprioception. Mini squat x  20 Fwd step R/L x 20 Fwd stepping over pool noodle x 20 Lateral step over pool noodle x 20 Lateral lunges x 10 B Fwd walking at counter trying to avoid R genu recurvatum Walking around clinic  THERAPEUTIC EXERCISE: To improve strength, endurance, ROM, and flexibility.  Bike L4x42min  GAIT TRAINING: To normalize gait pattern and improve safety.  THERAPEUTIC ACTIVITIES: To improve functional performance.  MANUAL THERAPY: To promote normalized muscle tension, improve joint mobility and/or for pain modulation  01/07/24 THERAPEUTIC EXERCISE: To improve strength.  Demonstration, verbal and tactile cues throughout for technique. Bike L8 x 8'  NEUROMUSCULAR RE-EDUCATION: To improve balance. Sidestepping back to counter and manual facilitation and cueing to prevent knee hyperextension x 6 laps Wall slides x 20 with facilitation to prevent knee hyperextension Lateral Step ups 8 step x 10 BLE  Counter back extension with 10 sec holds x 10  Step touch 8 x 10 BLE with min assist for weight shift and knee blocking manually Counter lunges x 10 BLE forward Step stance x 5' BLE with head turns and cognitive distraction with manual wt shifting Gait around gym with manual facilitation with v/c to prevent r knee hyperextension, and promote spine/hip ext   01/05/24 THERAPEUTIC EXERCISE: To improve strength.  Demonstration, verbal and tactile cues throughout for technique.  Nustep L6 x 8'  PHYSICAL PERFORMANCE TEST or MEASUREMENT: Berg Test administered.  Score:  37/56  NEUROMUSCULAR RE-EDUCATION: To improve balance. Sidestepping at mat table behind knees to prevent hyperextension x 6 laps Sit to Stand no hands x 2/5 Step stance with rocking f/b x 20 BLE Step touch 8 x 10 BLE with min assist for weight shift and  knee blocking manually Counter lunges x 10 BLE forward Counter lunges S/S x 10 BLE  01/01/24 THERAPEUTIC EXERCISE: To improve strength and endurance.  Demonstration, verbal and tactile cues  throughout for technique. NuStep L5 x 6'  NEUROMUSCULAR RE-EDUCATION: To improve balance, coordination, posture, reduce fall risk, and amplitude of movement. Bent knee sidestepping counter x 3 laps Bent knee F/B gait at counter x 3 laps Bent knee sidestepping back to counter with   5# BLE x 2 laps Step lunges x 10 BLE  Stagger stance f/b manual wt shifting Decline ramp standing with marching and manual NDT for knee hyperextension prevention x 10 BLE Gait training rollator x 3 gym laps with manual blocking to prevent knee hyperextension with variable manual vs. Vc Alternating step touch on 7 x 10 with cueing to avoid hyperextension  THERAPEUTIC ACTIVITIES: To improve functional performance.  Demonstration, verbal and tactile cues throughout for technique. Step ups 8 x 10 BLE Lumbar extension at counter x 15 Seated knee extension 5# w/ 3 sec holds  x 20 BLE 5X STS =15 sec.  12/29/23 Nustep L5 x 8 min Hip hinge holding on to counter into glute setting 2x10 Counter plank 3x30 Counter lumbar ext x10 Staggered stance rock fwd/bwd 2x10 focusing on hip extension during stance Standing hip ext 2x10 cues to keep trunk forward Gait training x3 laps CW, x 3 laps CCW with rollator - cues for heel strike with soft knee bend to keep from knee hyperextension transitioning to hip extension during stance phase  12/22/23 Recumbent bike L3 x 8 min Supine feet on peanut pball  Bridging with knees extended 2x10 Hamstring curl into bridge with knees flexed 2x10 Knee extension against black TB 2x10 Knees extended slide them on/off ball 2x10 Standing  Staggered stance rock fwd/bwd x30 no UE assist Gait training x3 laps CW, x 2.25 laps CCW with rollator - cues for heel strike, increased stride slength, and upright posture    12/18/23 Nustep L5 x 6 min Supine bridge 2x10 Upper trunk twist x 10 B SLR 2x10 B Supine hip ADD + ab brace 10x5; 2 set Standing glute sets Standing quad sets/TKE  Gait  training - cues for heel strike, increased stride slength, and upright posture  12/15/23 Nustep L7 x 8 min Supine bridge 2x10 Supine hip abd 2x10 Sidelying clamshell red TB 2x10 Seated knee flex/ext red TB 2x10 Seated ankle DF red TB 2x10 Staggered stance glute setting 2x10 Staggered stance static balance with UE support 2x30 Amb x1 lap around gym with 4 wheel walker, focusing on hip extension and keeping chest up and diminishing knee hyperextension   12/08/23 Nustep L5 x 6 min 5x STS =17 sec Heel/toe raises x 20 Retro step x 20 RLE behind Standing LLE toe tap lateral and posterior  x20 Mini squat at counter 10x3; 2 sets Leg curls 20lb 2x10 BLE Leg ext 20lb 2x10 BLE   11/20/23 Nustep L5 x 6 min Standing hip abduction, extension, marching RTB at thighs 2x10 Retro step x 15 Church pews x 15 Seated LAQ 3lb x 10 BLE Seated rows RTB x 20 Seated shoulder RTB x 15  11/12/23 Nustep L5 x 6 min Seated PWR! up red TB around thighs 2x10 Seated sit<>stand red TB around thighs focus on glute set 2x10 Standing PWR! Step back red TB around thighs 2x10 Standing PWR! Side step red TB around thighs 2x10 Standing terminal knee ext red TB focusing on eccentric control to decrease knee hyperextension 3x10 Eccentric sit to  stand x10 Amb with rollator, focusing on hip extension and diminishing knee hyperextension with improved heel strike noted x 1 lap around gym    PATIENT EDUCATION:  Education details: Exam findings, POC, initial HEP Person educated: Patient Education method: Explanation, Demonstration, and Handouts Education comprehension: verbalized understanding, returned demonstration, and needs further education  HOME EXERCISE PROGRAM: Access Code: [email or web address] URL: [email or web address] Date: 11/20/2023 Prepared by: Braylin Clark  Exercises - Seated Hip Abduction with Resistance  - 1 x daily - 7 x weekly - 2 sets - 10 reps - Seated March with Resistance  - 1 x daily  - 7 x weekly - 2 sets - 10 reps - Seated Gluteal Sets  - 1 x daily - 7 x weekly - 1 sets - 10 reps - 3 sec hold - Sit to Stand with Resistance Around Legs  - 1 x daily - 7 x weekly - 2 sets - 10 reps - Standing Hip Abduction with Resistance at Ankles and Counter Support  - 1 x daily - 7 x weekly - 2 sets - 10 reps - Standing Hip Extension with Resistance at Ankles and Counter Support  - 1 x daily - 7 x weekly - 2 sets - 10 reps  ASSESSMENT:  CLINICAL IMPRESSION: Progressed balance and interventions to improve heel strike and avoid locking out on knees with gait. Pt able to avoid this when thinking about it and with cues but when not thinking about it he does lock it .  Patient has been seen x 2 months PT following a 2nd R THA revision with constrained liner due to multiple THA dislocations.  He has had to cancel a number of his visits due to eye surgery.   HE also has Parkinson's dz which has slowed his recovery time.   However, he is making good progress to goals.  He has not yet attained all of the goals that we have set for him, but has good rehab potential to meet all goals.   5x sit to stand has improved from 22 to 17 sec, and he has met his goal for being able to alternately toe tap on a step without knee hyperextension, but requires vc to do it consistently.   He still has bilateral knee hyperextension with ambulation with his rollator, but he can self correct with vc.   Lars is not checked today, but will be checked next visit.   Discussed orthotist consult for AFO's to help with knee hyperextension.  Recommended patient  f/u with his PCP for F2F referral to orthotist.  PT remains necessary 2x weekly x 4 more weeks for gait, balance, strength, stair negotiation deficits.    Patient is agreeable to this plan of care.  Patient is a 73 y.o. M who was seen today for physical therapy evaluation and treatment s/p L hip constrained liner revision (10/01/23). Pt states his L hip has been dislocated 3x and  he has gotten surgery on it 3x. PMH of Parkinson's with prior neck and back surgery. Assessment significant for gross bilat LE weakness, L>R hip tightness in hamstrings/hip flexors and glutes, and high fall risk based on his Berg Balance. Pt will benefit from PT to address these deficits to improve his safety with home and community amb.   OBJECTIVE IMPAIRMENTS: Abnormal gait, decreased activity tolerance, decreased balance, decreased coordination, decreased endurance, decreased mobility, difficulty walking, decreased ROM, decreased strength, hypomobility, increased edema, increased fascial restrictions, increased muscle spasms, impaired flexibility, impaired tone, improper body mechanics, postural  dysfunction, and pain.     GOALS: Goals reviewed with patient? Yes  SHORT TERM GOALS: Target date: 01/16/2024  Pt will be ind with initial HEP Baseline: Goal status: MET 11/20/23  2.  Pt will have improved 5x STS to </=13 sec to demo increased functional LE strength Baseline:  Goal status: - PROGRESSING 15 seconds 01/01/24    LONG TERM GOALS: Target date:  01/30/2024  Pt will be ind with management and progression of HEP Baseline:  Goal status: MET  2.  Pt will have improved Berg Balance Score to >/=45 to demo decreased fall risk Baseline:  Goal status: IN PROGRESS 37/56 on 01/05/24   3.  Pt will be able to alternate tapping his feet on a step without R knee hyperextension throwing off his balance to demo improving LE Stability for safe stair and curb negotiation Baseline:  Goal status:  MET with v/c  4.  Pt will have improved LEFS score to >/=46/80 to demo MCID Baseline: 33/80 on 01/07/24 Goal status: IN PROGRESS    PLAN:/  PT FREQUENCY: 2x/week  PT DURATION: 8 weeks  PLANNED INTERVENTIONS: [id number]- PT Re-evaluation, 97110-Therapeutic exercises, 97530- Therapeutic activity, 97112- Neuromuscular re-education, 97535- Self Care, [id number]- Manual therapy, [phone]- Gait training, [phone]- Aquatic  Therapy, [phone]- Electrical stimulation (unattended), [phone]- Ionotophoresis 4mg /ml Dexamethasone , Patient/Family education, Balance training, Stair training, Taping, Dry Needling, Joint mobilization, Spinal mobilization, Cryotherapy, and Moist heat  PLAN FOR NEXT SESSION: Continue PT for further hip strengthening, neuro reeducation for keeping his weight over his toes in standing, try step ups.  Reassess LEFS next visit to update goals PRN.      Shaena Parkerson, PT 01/15/2024, 5:07 PM

## 2024-01-20 ENCOUNTER — Ambulatory Visit: Admitting: Rehabilitation

## 2024-01-20 DIAGNOSIS — R2689 Other abnormalities of gait and mobility: Secondary | ICD-10-CM

## 2024-01-20 DIAGNOSIS — M6281 Muscle weakness (generalized): Secondary | ICD-10-CM | POA: Diagnosis not present

## 2024-01-20 DIAGNOSIS — R2681 Unsteadiness on feet: Secondary | ICD-10-CM

## 2024-01-20 DIAGNOSIS — R29818 Other symptoms and signs involving the nervous system: Secondary | ICD-10-CM

## 2024-01-20 DIAGNOSIS — R293 Abnormal posture: Secondary | ICD-10-CM

## 2024-01-20 NOTE — Therapy (Signed)
 OUTPATIENT PHYSICAL THERAPY LOWER EXTREMITY TREATMENT    Patient Name: KAMAREON SCIANDRA MRN: [id number] DOB:Nov 07, 1950, 73 y.o., male Today's Date: 01/20/2024  END OF SESSION:  PT End of Session - 01/20/24 1325     Visit Number 15    PT Start Time 1320    PT Stop Time 1404    PT Time Calculation (min) 44 min    Equipment Utilized During Treatment Gait belt    Activity Tolerance Patient tolerated treatment well;No increased pain    Behavior During Therapy [facility] for tasks assessed/performed                Past Medical History:  Diagnosis Date   Abnormal gait    due to parkinson's,  uses cane   Absolute anemia 03/13/2023   Allergic rhinitis    Allergy Spring 1998   Allergic to dustmits, dog hair and roaches.   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 glendia  @[facility]   Pneumonia    Sleep apnea    Vitamin B12 deficiency    Vitamin D  deficiency    Wears glasses    Past Surgical History:  Procedure Laterality Date   APPENDECTOMY  07/1964   APPENDECTOMY     CHOLECYSTECTOMY     JOINT REPLACEMENT  March 2019 and again on 09/10/2021   Left hip twice.  2nd time due to 3 dislocations   LAPAROSCOPIC CHOLECYSTECTOMY  01-21-2017   @[facility] , [place]   lower back surgery      POSTERIOR CERVICAL FUSION/FORAMINOTOMY     POSTERIOR FUSION CERVICAL SPINE  08-15-2011   @[facility] , [place]   w/ laminectomy and decompression-- C3 -- T1   POSTERIOR LAMINECTOMY / DECOMPRESSION LUMBAR SPINE  07-30-2013    @[facility] , [place]   bilateral T11 - 12,  L2 --5   REMOVAL LEFT T1 PARASPINOUS MASS  02-12-2016   @[facility] ,  [place]   desmoid fibromatosis   SHOULDER ARTHROSCOPY WITH ROTATOR CUFF REPAIR Left 12/15/2018   Procedure: SHOULDER ARTHROSCOPY, biceps tenodesis labored Debridement, submacromial decompression;  Surgeon: Gerome Lamar, MD;  Location: [facility];  Service: Orthopedics;  Laterality: Left;  interscalene block   SPINE SURGERY     TOTAL HIP ARTHROPLASTY Left 09/18/2017   @[facility] , [place]   TOTAL HIP REVISION Left 09/10/2021   Procedure: Left hip acetabular versus total hip arthroplasty revision, constrained liner;  Surgeon: Melodi Lerner, MD;  Location: [facility];  Service: Orthopedics;  Laterality: Left;   TOTAL HIP REVISION Left 10/01/2023   Procedure: Left hip constrained liner revision;  Surgeon: Melodi Lerner, MD;  Location: [facility];  Service: Orthopedics;  Laterality: Left;    Patient Active Problem List   Diagnosis Date Noted   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   Thrombocytopenia (HCC) 03/17/2023   Absolute anemia 03/13/2023   Family history of prostate cancer 03/13/2023   History of elevated PSA 03/13/2023  AV block, 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   Drug induced constipation 12/30/2022   Acquired hypothyroidism 12/30/2022   Primary osteoarthritis of left hip 05/18/2021   PD (Parkinson's disease) (HCC) 05/18/2021   History of gastroesophageal reflux (GERD) 05/18/2021   OSA (obstructive sleep apnea) 05/18/2021   Essential hypertension 05/18/2021    PCP: Abran Jon CROME, MD  REFERRING PROVIDER: Kristian Stabs, PA  REFERRING DIAG: Z47.89: Encounter for other orthopedic aftercare S/P left hip constrained liner revision  THERAPY DIAG:  Muscle weakness (generalized)  Unsteadiness on feet  Other abnormalities of gait and mobility  Abnormal posture  Other symptoms and signs involving the  nervous system  Rationale for Evaluation and Treatment: Rehabilitation  ONSET DATE: 10/01/23 L hip revision  SUBJECTIVE:   SUBJECTIVE STATEMENT: 7/22:  States has an appointment with [facility] for 01/29/24 for AFO.  Patient encouraged to obtain a double upright with plantar flexion stop in slight dorsiflexion to prevent hyperextension.   Pt reports he had a L hip replacement in 2019 and dislocated it 3 times. Redid it 2023, had a fall and jammed it and had to revise it 10/01/23. Was told not to go too heavy over 110 lbs on the leg press. Was told not to cross his legs. Will be seeing ortho 11/11/23 and will further clarify precautions. Pt feels his greatest issue is strength. Has not yet returned to the gym. Has been walking some but wants to walk more. Plans to do water  aerobics. Uses his 4 wheel walker at baseline.  PERTINENT HISTORY: Parkinson's (Last saw PT for this in July 2024), back problems (surgery in neck and low back), R ankle rolls  PAIN:  Are you having pain? No and denies pain  PRECAUTIONS: Fall  RED FLAGS: None   WEIGHT BEARING RESTRICTIONS: No  FALLS:  Has patient fallen in last 6 months? Yes. Number of falls 3 -- last fall yesterday getting pizza out of the stove  LIVING ENVIRONMENT: Lives with: lives with their spouse and daughter Lives in: House/apartment Stairs: Yes: Internal: 14 steps; can reach both Has following equipment at home: Vannie - 4 wheeled  OCCUPATION: Retired -- reads, tries to exercise, watches TV  PLOF: Independent  PATIENT GOALS: Return to exercise and walking  NEXT MD VISIT: 12/18/23  OBJECTIVE:  Note: Objective measures were completed at Evaluation unless otherwise noted.  DIAGNOSTIC FINDINGS: x-ray on R foot and ankle 03/25/22: Ankle joint space maintained. Elevated first ray. Midfoot arthritic changes present.   PATIENT SURVEYS:  LEFS: 37/80 = 46.25% EDEMA:  None now  MUSCLE LENGTH: Hamstrings: Right ~80 deg; Left ~70 deg Thomas  test: Right 0 deg; Left 0 deg  POSTURE: rounded shoulders, forward head, increased thoracic kyphosis, and flexed trunk , R knee hyperextends and comes into varus, L knee goes into valgus  PALPATION: No overt tenderness to palpation   LOWER EXTREMITY MMT:  MMT Right eval Left eval  Hip flexion 5 5  Hip extension 3+ 3+  Hip abduction 3 3  Hip adduction    Hip internal rotation    Hip external rotation    Knee flexion 5 4  Knee extension 5 5  Ankle dorsiflexion    Ankle plantarflexion    Ankle inversion    Ankle eversion     (Blank rows = not tested)  LOWER EXTREMITY SPECIAL TESTS:  Did not assess  FUNCTIONAL TESTS:  5 times sit to stand: 15.1 sec without UEs Timed up and go (  TUG): 13.77 sec Berg Balance Scale: 37/56 on 01/05/24  GAIT: Distance walked: Into clinic Assistive device utilized: Walker - 4 wheeled Level of assistance: SBA Comments: R knee hyperextension during stance, increased forward lean, scissors with R LE, decreased heel strike                                                                                                                                TREATMENT DATE:   01/20/24 THERAPEUTIC EXERCISE: To improve strength.  Demonstration, verbal and tactile cues throughout for technique. Nu Step L5 x 7'  NEUROMUSCULAR RE-EDUCATION: To improve balance. Sidestepping at counter 2# ankle wts x 5 laps\ Step touch 9 w/ 2# wts x 10 BLE Step ups aerobic step laterally x 2/10 BLE Purple pool noodle step overs x 10 F; x 10 sideways Step over and forward lunge 1 leg at a time forwards x 10 each leg  Step over and lateral lunge x 10 ea leg  THERAPEUTIC ACTIVITIES: To improve functional performance.  Demonstration, verbal and tactile cues throughout for technique. Seated knee flexion 35# x 2/20 BLE Seated knee extension 35# x 2/20 BLE   01/15/24 THERAPEUTIC EXERCISE: To improve strength.  Demonstration, verbal and tactile cues throughout for technique. Bike  L4 x 8'  NEUROMUSCULAR RE-EDUCATION: To improve balance. F/B gait w/ single UE support at counter x 5 laps with intermittent manual L knee cues Sidestep lunges at up/down counter x 5 laps BOSU lunging F/B in step stance x 15 BLE Foam standing w/ marching x 20 BLE Stepping over poodle noodle F/B X 20 and S/S 20  Lateral step ups aerobic step x 20 BLE  THERAPEUTIC ACTIVITIES: To improve functional performance.  Demonstration, verbal and tactile cues throughout for technique. Counter lumbar extension x 20 Sitting in chair and scooting the chair forward x 8'  01/13/24 NEUROMUSCULAR RE-EDUCATION: To improve coordination, kinesthesia, posture, and proprioception. Mini squat x 20 Fwd step R/L x 20 Fwd stepping over pool noodle x 20 Lateral step over pool noodle x 20 Lateral lunges x 10 B Fwd walking at counter trying to avoid R genu recurvatum Walking around clinic  THERAPEUTIC EXERCISE: To improve strength, endurance, ROM, and flexibility.  Bike L4x16min  GAIT TRAINING: To normalize gait pattern and improve safety.  THERAPEUTIC ACTIVITIES: To improve functional performance.  MANUAL THERAPY: To promote normalized muscle tension, improve joint mobility and/or for pain modulation  01/07/24 THERAPEUTIC EXERCISE: To improve strength.  Demonstration, verbal and tactile cues throughout for technique. Bike L8 x 8'  NEUROMUSCULAR RE-EDUCATION: To improve balance. Sidestepping back to counter and manual facilitation and cueing to prevent knee hyperextension x 6 laps Wall slides x 20 with facilitation to prevent knee hyperextension Lateral Step ups 8 step x 10 BLE  Counter back extension with 10 sec holds x 10  Step touch 8 x 10 BLE with min assist for weight shift and knee blocking manually Counter lunges x 10 BLE forward  Step stance x 5' BLE with head turns and cognitive distraction with manual wt shifting Gait around gym with manual facilitation with v/c to prevent r knee hyperextension, and  promote spine/hip ext   01/05/24 THERAPEUTIC EXERCISE: To improve strength.  Demonstration, verbal and tactile cues throughout for technique.  Nustep L6 x 8'  PHYSICAL PERFORMANCE TEST or MEASUREMENT: Berg Test administered.  Score:  37/56  NEUROMUSCULAR RE-EDUCATION: To improve balance. Sidestepping at mat table behind knees to prevent hyperextension x 6 laps Sit to Stand no hands x 2/5 Step stance with rocking f/b x 20 BLE Step touch 8 x 10 BLE with min assist for weight shift and knee blocking manually Counter lunges x 10 BLE forward Counter lunges S/S x 10 BLE  01/01/24 THERAPEUTIC EXERCISE: To improve strength and endurance.  Demonstration, verbal and tactile cues throughout for technique. NuStep L5 x 6'  NEUROMUSCULAR RE-EDUCATION: To improve balance, coordination, posture, reduce fall risk, and amplitude of movement. Bent knee sidestepping counter x 3 laps Bent knee F/B gait at counter x 3 laps Bent knee sidestepping back to counter with   5# BLE x 2 laps Step lunges x 10 BLE  Stagger stance f/b manual wt shifting Decline ramp standing with marching and manual NDT for knee hyperextension prevention x 10 BLE Gait training rollator x 3 gym laps with manual blocking to prevent knee hyperextension with variable manual vs. Vc Alternating step touch on 7 x 10 with cueing to avoid hyperextension  THERAPEUTIC ACTIVITIES: To improve functional performance.  Demonstration, verbal and tactile cues throughout for technique. Step ups 8 x 10 BLE Lumbar extension at counter x 15 Seated knee extension 5# w/ 3 sec holds  x 20 BLE 5X STS =15 sec.  12/29/23 Nustep L5 x 8 min Hip hinge holding on to counter into glute setting 2x10 Counter plank 3x30 Counter lumbar ext x10 Staggered stance rock fwd/bwd 2x10 focusing on hip extension during stance Standing hip ext 2x10 cues to keep trunk forward Gait training x3 laps CW, x 3 laps CCW with rollator - cues for heel strike with soft knee  bend to keep from knee hyperextension transitioning to hip extension during stance phase  12/22/23 Recumbent bike L3 x 8 min Supine feet on peanut pball  Bridging with knees extended 2x10 Hamstring curl into bridge with knees flexed 2x10 Knee extension against black TB 2x10 Knees extended slide them on/off ball 2x10 Standing  Staggered stance rock fwd/bwd x30 no UE assist Gait training x3 laps CW, x 2.25 laps CCW with rollator - cues for heel strike, increased stride slength, and upright posture    12/18/23 Nustep L5 x 6 min Supine bridge 2x10 Upper trunk twist x 10 B SLR 2x10 B Supine hip ADD + ab brace 10x5; 2 set Standing glute sets Standing quad sets/TKE  Gait training - cues for heel strike, increased stride slength, and upright posture  12/15/23 Nustep L7 x 8 min Supine bridge 2x10 Supine hip abd 2x10 Sidelying clamshell red TB 2x10 Seated knee flex/ext red TB 2x10 Seated ankle DF red TB 2x10 Staggered stance glute setting 2x10 Staggered stance static balance with UE support 2x30 Amb x1 lap around gym with 4 wheel walker, focusing on hip extension and keeping chest up and diminishing knee hyperextension   12/08/23 Nustep L5 x 6 min 5x STS =17 sec Heel/toe raises x 20 Retro step x 20 RLE behind Standing LLE toe tap lateral and posterior  x20 Mini squat at counter 10x3; 2 sets  Leg curls 20lb 2x10 BLE Leg ext 20lb 2x10 BLE   11/20/23 Nustep L5 x 6 min Standing hip abduction, extension, marching RTB at thighs 2x10 Retro step x 15 Church pews x 15 Seated LAQ 3lb x 10 BLE Seated rows RTB x 20 Seated shoulder RTB x 15  11/12/23 Nustep L5 x 6 min Seated PWR! up red TB around thighs 2x10 Seated sit<>stand red TB around thighs focus on glute set 2x10 Standing PWR! Step back red TB around thighs 2x10 Standing PWR! Side step red TB around thighs 2x10 Standing terminal knee ext red TB focusing on eccentric control to decrease knee hyperextension 3x10 Eccentric sit  to stand x10 Amb with rollator, focusing on hip extension and diminishing knee hyperextension with improved heel strike noted x 1 lap around gym    PATIENT EDUCATION:  Education details: Exam findings, POC, initial HEP Person educated: Patient Education method: Explanation, Demonstration, and Handouts Education comprehension: verbalized understanding, returned demonstration, and needs further education  HOME EXERCISE PROGRAM: Access Code: [email or web address] URL: [email or web address] Date: 11/20/2023 Prepared by: Braylin Clark  Exercises - Seated Hip Abduction with Resistance  - 1 x daily - 7 x weekly - 2 sets - 10 reps - Seated March with Resistance  - 1 x daily - 7 x weekly - 2 sets - 10 reps - Seated Gluteal Sets  - 1 x daily - 7 x weekly - 1 sets - 10 reps - 3 sec hold - Sit to Stand with Resistance Around Legs  - 1 x daily - 7 x weekly - 2 sets - 10 reps - Standing Hip Abduction with Resistance at Ankles and Counter Support  - 1 x daily - 7 x weekly - 2 sets - 10 reps - Standing Hip Extension with Resistance at Ankles and Counter Support  - 1 x daily - 7 x weekly - 2 sets - 10 reps  ASSESSMENT:  CLINICAL IMPRESSION: Patient is progressing well with all neuro reducation for not hyperextending his knees in gait.  He has good strength in both legs.  However he continues to habitually hyperextend R knee > L with ambulation unless he makes a concerted, concentrated effort to not do it.   He is to see Orthotist in 1 week for AFO RLE.  He is at a point where he can continue with his HEP and is also encouraged to go to local gym for continued endurance and strength activities.   D/C is planned for next visit and he is agreeable to this plan  Patient has been seen x 2 months PT following a 2nd R THA revision with constrained liner due to multiple THA dislocations.  He has had to cancel a number of his visits due to eye surgery.   HE also has Parkinson's dz which has slowed his recovery  time.   However, he is making good progress to goals.  He has not yet attained all of the goals that we have set for him, but has good rehab potential to meet all goals.   5x sit to stand has improved from 22 to 17 sec, and he has met his goal for being able to alternately toe tap on a step without knee hyperextension, but requires vc to do it consistently.   He still has bilateral knee hyperextension with ambulation with his rollator, but he can self correct with vc.   Lars is not checked today, but will be checked next visit.   Discussed orthotist consult for AFO's to  help with knee hyperextension.  Recommended patient  f/u with his PCP for F2F referral to orthotist.  PT remains necessary 2x weekly x 4 more weeks for gait, balance, strength, stair negotiation deficits.    Patient is agreeable to this plan of care.  Patient is a 73 y.o. M who was seen today for physical therapy evaluation and treatment s/p L hip constrained liner revision (10/01/23). Pt states his L hip has been dislocated 3x and he has gotten surgery on it 3x. PMH of Parkinson's with prior neck and back surgery. Assessment significant for gross bilat LE weakness, L>R hip tightness in hamstrings/hip flexors and glutes, and high fall risk based on his Berg Balance. Pt will benefit from PT to address these deficits to improve his safety with home and community amb.   OBJECTIVE IMPAIRMENTS: Abnormal gait, decreased activity tolerance, decreased balance, decreased coordination, decreased endurance, decreased mobility, difficulty walking, decreased ROM, decreased strength, hypomobility, increased edema, increased fascial restrictions, increased muscle spasms, impaired flexibility, impaired tone, improper body mechanics, postural dysfunction, and pain.     GOALS: Goals reviewed with patient? Yes  SHORT TERM GOALS: Target date: 01/16/2024  Pt will be ind with initial HEP Baseline: Goal status: MET 11/20/23  2.  Pt will have improved 5x STS to  </=13 sec to demo increased functional LE strength Baseline:  Goal status: - PROGRESSING 15 seconds 01/01/24    LONG TERM GOALS: Target date:  01/30/2024  Pt will be ind with management and progression of HEP Baseline:  Goal status: MET  2.  Pt will have improved Berg Balance Score to >/=45 to demo decreased fall risk Baseline:  Goal status: IN PROGRESS 37/56 on 01/05/24   3.  Pt will be able to alternate tapping his feet on a step without R knee hyperextension throwing off his balance to demo improving LE Stability for safe stair and curb negotiation Baseline:  Goal status:  MET with v/c  4.  Pt will have improved LEFS score to >/=46/80 to demo MCID Baseline: 33/80 on 01/07/24 Goal status: IN PROGRESS    PLAN:/  PT FREQUENCY: 2x/week  PT DURATION: 8 weeks  PLANNED INTERVENTIONS: [id number]- PT Re-evaluation, 97110-Therapeutic exercises, 97530- Therapeutic activity, 97112- Neuromuscular re-education, 97535- Self Care, [id number]- Manual therapy, [phone]- Gait training, [phone]- Aquatic Therapy, [phone]- Electrical stimulation (unattended), [phone]- Ionotophoresis 4mg /ml Dexamethasone , Patient/Family education, Balance training, Stair training, Taping, Dry Needling, Joint mobilization, Spinal mobilization, Cryotherapy, and Moist heat  PLAN FOR NEXT SESSION: D/C PT next visit   Stephaie Dardis, PT 01/20/2024, 5:23 PM

## 2024-01-21 ENCOUNTER — Ambulatory Visit (INDEPENDENT_AMBULATORY_CARE_PROVIDER_SITE_OTHER)

## 2024-01-21 ENCOUNTER — Encounter: Payer: Self-pay | Admitting: Physician Assistant

## 2024-01-21 ENCOUNTER — Ambulatory Visit: Attending: Physician Assistant | Admitting: Physician Assistant

## 2024-01-21 VITALS — BP 123/67 | HR 49 | Resp 17 | Ht 68.0 in | Wt 191.4 lb

## 2024-01-21 DIAGNOSIS — H16223 Keratoconjunctivitis sicca, not specified as Sjogren's, bilateral: Secondary | ICD-10-CM | POA: Diagnosis present

## 2024-01-21 DIAGNOSIS — M5030 Other cervical disc degeneration, unspecified cervical region: Secondary | ICD-10-CM | POA: Diagnosis present

## 2024-01-21 DIAGNOSIS — M79641 Pain in right hand: Secondary | ICD-10-CM | POA: Diagnosis present

## 2024-01-21 DIAGNOSIS — M47816 Spondylosis without myelopathy or radiculopathy, lumbar region: Secondary | ICD-10-CM | POA: Insufficient documentation

## 2024-01-21 DIAGNOSIS — G4733 Obstructive sleep apnea (adult) (pediatric): Secondary | ICD-10-CM | POA: Insufficient documentation

## 2024-01-21 DIAGNOSIS — Z8719 Personal history of other diseases of the digestive system: Secondary | ICD-10-CM | POA: Diagnosis present

## 2024-01-21 DIAGNOSIS — I10 Essential (primary) hypertension: Secondary | ICD-10-CM | POA: Insufficient documentation

## 2024-01-21 DIAGNOSIS — M19041 Primary osteoarthritis, right hand: Secondary | ICD-10-CM | POA: Diagnosis not present

## 2024-01-21 DIAGNOSIS — E559 Vitamin D deficiency, unspecified: Secondary | ICD-10-CM | POA: Diagnosis present

## 2024-01-21 DIAGNOSIS — E538 Deficiency of other specified B group vitamins: Secondary | ICD-10-CM | POA: Insufficient documentation

## 2024-01-21 DIAGNOSIS — M1811 Unilateral primary osteoarthritis of first carpometacarpal joint, right hand: Secondary | ICD-10-CM

## 2024-01-21 DIAGNOSIS — M19042 Primary osteoarthritis, left hand: Secondary | ICD-10-CM | POA: Insufficient documentation

## 2024-01-21 DIAGNOSIS — Z8669 Personal history of other diseases of the nervous system and sense organs: Secondary | ICD-10-CM | POA: Insufficient documentation

## 2024-01-21 DIAGNOSIS — Z8639 Personal history of other endocrine, nutritional and metabolic disease: Secondary | ICD-10-CM | POA: Diagnosis present

## 2024-01-21 DIAGNOSIS — M79642 Pain in left hand: Secondary | ICD-10-CM | POA: Diagnosis present

## 2024-01-21 DIAGNOSIS — Z96642 Presence of left artificial hip joint: Secondary | ICD-10-CM | POA: Insufficient documentation

## 2024-01-21 MED ORDER — TRIAMCINOLONE ACETONIDE 40 MG/ML IJ SUSP
20.0000 mg | INTRAMUSCULAR | Status: AC | PRN
  Administered 2024-01-21: 20 mg via INTRA_ARTICULAR

## 2024-01-21 MED ORDER — LIDOCAINE HCL 1 % IJ SOLN
0.5000 mL | INTRAMUSCULAR | Status: AC | PRN
  Administered 2024-01-21: .5 mL

## 2024-01-22 ENCOUNTER — Ambulatory Visit: Payer: Self-pay | Admitting: Physician Assistant

## 2024-01-22 NOTE — Progress Notes (Signed)
ESR and CRP WNL  RF negative

## 2024-01-23 ENCOUNTER — Ambulatory Visit: Admitting: Rehabilitation

## 2024-01-23 DIAGNOSIS — R29818 Other symptoms and signs involving the nervous system: Secondary | ICD-10-CM

## 2024-01-23 DIAGNOSIS — R2681 Unsteadiness on feet: Secondary | ICD-10-CM

## 2024-01-23 DIAGNOSIS — R2689 Other abnormalities of gait and mobility: Secondary | ICD-10-CM

## 2024-01-23 DIAGNOSIS — R293 Abnormal posture: Secondary | ICD-10-CM

## 2024-01-23 DIAGNOSIS — M6281 Muscle weakness (generalized): Secondary | ICD-10-CM

## 2024-01-23 LAB — RHEUMATOID FACTOR: Rheumatoid fact SerPl-aCnc: <10 [IU]/mL (ref <14)

## 2024-01-23 LAB — SEDIMENTATION RATE: Sed Rate: 6 mm/h (ref 0–20)

## 2024-01-23 LAB — CYCLIC CITRUL PEPTIDE ANTIBODY, IGG: Cyclic Citrullin Peptide Ab: <16 U

## 2024-01-23 LAB — C-REACTIVE PROTEIN: CRP: <3 mg/L (ref <8.0)

## 2024-01-23 LAB — MUTATED CITRULLINATED VIMENTIN (MCV) ANTIBODY: MUTATED CITRULLINATED VIMENTIN (MCV) AB: <20 U/mL (ref <20)

## 2024-01-23 NOTE — Therapy (Addendum)
 OUTPATIENT PHYSICAL THERAPY LOWER EXTREMITY TREATMENT / DC SUMMARY    Patient Name: Jim Carson MRN: [id number] DOB:[birth year], 73 y.o., male Today's Date: 01/23/2024  END OF SESSION:  PT End of Session - 01/23/24 1218     Visit Number 16    PT Start Time 1103    PT Stop Time 1145    PT Time Calculation (min) 42 min    Equipment Utilized During Treatment Gait belt    Activity Tolerance Patient tolerated treatment well;No increased pain    Behavior During Therapy [facility] for tasks assessed/performed                 Past Medical History:  Diagnosis Date   Abnormal gait    due to parkinson's,  uses cane   Absolute anemia 03/13/2023   Allergic rhinitis    Allergy Spring 1998   Allergic to dustmits, dog hair and roaches.   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 glendia  @[facility]  Neurology   Pneumonia    Sleep apnea    Vitamin B12 deficiency    Vitamin D  deficiency    Wears glasses    Past Surgical History:  Procedure Laterality Date   APPENDECTOMY  07/1964   APPENDECTOMY     CHOLECYSTECTOMY     JOINT REPLACEMENT  March 2019 and again on 09/10/2021   Left hip twice.  2nd time due to 3 dislocations   LAPAROSCOPIC CHOLECYSTECTOMY  01-21-2017   @[facility] , [place]   lower back surgery      POSTERIOR CERVICAL FUSION/FORAMINOTOMY     POSTERIOR FUSION CERVICAL SPINE  08-15-2011   @[facility] , [place]   w/ laminectomy and decompression-- C3 -- T1   POSTERIOR LAMINECTOMY / DECOMPRESSION LUMBAR SPINE   07-30-2013   @[facility] , [place]   bilateral T11 - 12,  L2 --5   REMOVAL LEFT T1 PARASPINOUS MASS  02-12-2016   @[facility] ,  [place]   desmoid fibromatosis   SHOULDER ARTHROSCOPY WITH ROTATOR CUFF REPAIR Left 12/15/2018   Procedure: SHOULDER ARTHROSCOPY, biceps tenodesis labored Debridement, submacromial decompression;  Surgeon: Gerome Lamar, MD;  Location: [facility];  Service: Orthopedics;  Laterality: Left;  interscalene block   SPINE SURGERY     TOTAL HIP ARTHROPLASTY Left 09/18/2017   @[facility] , [place]   TOTAL HIP REVISION Left 09/10/2021   Procedure: Left hip acetabular versus total hip arthroplasty revision, constrained liner;  Surgeon: Melodi Lerner, MD;  Location: [facility];  Service: Orthopedics;  Laterality: Left;   TOTAL HIP REVISION Left 10/01/2023   Procedure: Left hip constrained liner revision;  Surgeon: Melodi Lerner, MD;  Location: [facility];  Service: Orthopedics;  Laterality: Left;    Patient Active Problem List   Diagnosis Date Noted   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   Thrombocytopenia (HCC) 03/17/2023   Absolute anemia 03/13/2023   Family history of prostate cancer 03/13/2023   History of  elevated PSA 03/13/2023   AV block, 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   Drug induced constipation 12/30/2022   Acquired hypothyroidism 12/30/2022   Primary osteoarthritis of left hip 05/18/2021   PD (Parkinson's disease) (HCC) 05/18/2021   History of gastroesophageal reflux (GERD) 05/18/2021   OSA (obstructive sleep apnea) 05/18/2021   Essential hypertension 05/18/2021    PCP: Abran Jon CROME, MD  REFERRING PROVIDER: Kristian Stabs, PA  REFERRING DIAG: Z47.89: Encounter for other orthopedic aftercare S/P left hip constrained liner revision  THERAPY DIAG:  Muscle weakness (generalized)  Unsteadiness on feet  Other abnormalities of gait and mobility  Abnormal posture  Other symptoms and signs  involving the nervous system  Rationale for Evaluation and Treatment: Rehabilitation  ONSET DATE: 10/01/23 L hip revision  SUBJECTIVE:   SUBJECTIVE STATEMENT: Patient is agreeable to PT and tolerates well.  He denies any falls. States his L hip feels fine.  Denies any pain.  Still frustrated about knee hyperextension R>L but will see [facility] for RLE AFO next week 7/30  PERTINENT HISTORY: Parkinson's (Last saw PT for this in July 2024), back problems (surgery in neck and low back), R ankle rolls  PAIN:  Are you having pain? No and denies pain  PRECAUTIONS: Fall  RED FLAGS: None   WEIGHT BEARING RESTRICTIONS: No  FALLS:  Has patient fallen in last 6 months? Yes. Number of falls 3 -- last fall yesterday getting pizza out of the stove  LIVING ENVIRONMENT: Lives with: lives with their spouse and daughter Lives in: House/apartment Stairs: Yes: Internal: 14 steps; can reach both Has following equipment at home: Walker - 4 wheeled  OCCUPATION: Retired -- reads, tries to exercise, watches TV  PLOF: Independent  PATIENT GOALS: Return to exercise and walking  OBJECTIVE:  Note: Objective measures were completed at Evaluation unless otherwise noted.  DIAGNOSTIC FINDINGS: x-ray on R foot and ankle 03/25/22: Ankle joint space maintained. Elevated first ray. Midfoot arthritic changes present.   PATIENT SURVEYS:  LEFS: 22/80  EDEMA:  None now  MUSCLE LENGTH: Hamstrings: Right ~80 deg; Left ~70 deg Thomas test: Right 0 deg; Left 0 deg  POSTURE: rounded shoulders, forward head, increased thoracic kyphosis, and flexed trunk , R knee hyperextends and comes into varus, L knee goes into valgus  PALPATION: No overt tenderness to palpation   LOWER EXTREMITY MMT:  MMT Right eval Left eval LLE 01/23/24 RLE 01/23/24  Hip flexion 5 5 5 5   Hip extension 3+ 3+ 3+ 3+  Hip abduction 3 3 5 5   Hip adduction      Hip internal rotation      Hip external rotation   5 5  Knee  flexion 5 4 5 5   Knee extension 5 5 5 5   Ankle dorsiflexion   4+ 4+  Ankle plantarflexion      Ankle inversion      Ankle eversion       (Blank rows = not tested)  LOWER EXTREMITY SPECIAL TESTS:  Did not assess  FUNCTIONAL TESTS:  5 times sit to stand: 12 sec with UEs Timed up and go (TUG): 14.35 sec Berg Balance Scale: 37/56 on 01/05/24  GAIT: Distance walked: Into clinic Assistive device utilized: Environmental consultant - 4 wheeled Level of assistance: SBA Comments: R knee hyperextension during stance, increased forward lean, scissors with R LE, decreased heel strike  TREATMENT DATE:  01/23/24 THERAPEUTIC EXERCISE: To improve strength.  Demonstration, verbal and tactile cues throughout for technique. Bike L4 x 8'  NEUROMUSCULAR RE-EDUCATION: To improve balance. Tandem stance x 1' x 2 ea BLE Step touch on 9 x 2/10 BLE Step over/lunge/return with large pool noodle x 10 BLE  THERAPEUTIC ACTIVITIES: To improve functional performance.  Demonstration, verbal and tactile cues throughout for technique. Sidestepping with green TB in slight squat at mat table x 5 laps Sit to stands x 3/5 with and without hands independently Mat to table transfers SPT no hands independently Rolling S/S and into prone on bed x 3 reps with vc for engaging transverse abs and bending knees to roll   01/20/24 THERAPEUTIC EXERCISE: To improve strength.  Demonstration, verbal and tactile cues throughout for technique. Nu Step L5 x 7'  NEUROMUSCULAR RE-EDUCATION: To improve balance. Sidestepping at counter 2# ankle wts x 5 laps Step touch 9 w/ 2# wts x 10 BLE Step ups aerobic step laterally x 2/10 BLE Purple pool noodle step overs x 10 F; x 10 sideways Step over and forward lunge 1 leg at a time forwards x 10 each leg  Step over and lateral lunge x 10 ea leg  THERAPEUTIC ACTIVITIES: To improve  functional performance.  Demonstration, verbal and tactile cues throughout for technique. Seated knee flexion 35# x 2/20 BLE Seated knee extension 35# x 2/20 BLE  PATIENT EDUCATION:  Education details: Exam findings, POC, initial HEP Person educated: Patient Education method: Explanation, Demonstration, and Handouts Education comprehension: verbalized understanding, returned demonstration, and needs further education  HOME EXERCISE PROGRAM: Access Code: [email or web address] URL: [email or web address] Date: 11/20/2023 Prepared by: Braylin Clark  Exercises - Seated Hip Abduction with Resistance  - 1 x daily - 7 x weekly - 2 sets - 10 reps - Seated March with Resistance  - 1 x daily - 7 x weekly - 2 sets - 10 reps - Seated Gluteal Sets  - 1 x daily - 7 x weekly - 1 sets - 10 reps - 3 sec hold - Sit to Stand with Resistance Around Legs  - 1 x daily - 7 x weekly - 2 sets - 10 reps - Standing Hip Abduction with Resistance at Ankles and Counter Support  - 1 x daily - 7 x weekly - 2 sets - 10 reps - Standing Hip Extension with Resistance at Ankles and Counter Support  - 1 x daily - 7 x weekly - 2 sets - 10 reps  ASSESSMENT:  CLINICAL IMPRESSION: Patient has been seen x 16 PT visits over the last 2 months PT following a 2nd R THA revision with constrained liner on 10/24/23.  Surgery was required due to to multiple THA dislocations.  He has done well with PT and is ready for D/C.  5x sit to stand test has improved to 12 sec with use of UEs.   TUG is variable based on his Parkinson's symptoms, but ranges between 13 and 14 sec which is also much improved over his initial score. BERG score has remained stable at 37/56 indicating that he should continue to use a walker at all times for safety.  Unfortunately his LEFS outcome score did not improve and is 22/80 today.  Mr Crumpacker can ambulate without hyperextending his knees when he concentrates on it, but habitually continues to hyperextend likely due to  the Parkinson's dz.  This is not a strength issue as his strength is excellent.   He has an appointment with [facility] next week to see about a RLE AFO which would help with the knee hyperextension issue.   He is going to keep this appointment and let us  know if he has any problems regarding the AFO.  Patient is independent with his home exercises and advised to continue with them daily as tolerated and call us  with any further question.  Mr Bethard has been a wonderful patient to work with and we appreciate the referral.   D/C PT   GOALS: Goals reviewed with patient? Yes  SHORT TERM GOALS: Target date: 01/16/2024  Pt will be ind with initial HEP Baseline: Goal status: MET 11/20/23  2.  Pt will have improved 5x STS to </=13 sec to demo increased functional LE strength Baseline:  Goal status: - MET 12 seconds 01/23/24    LONG TERM GOALS: Target date:  01/30/2024  Pt will be ind with management and progression of HEP Baseline:  Goal status: MET  2.  Pt will have improved Berg Balance Score to >/=45 to demo decreased fall risk Baseline:  Goal status: STABILIZED 37/56 on 01/05/24   3.  Pt will be able to alternate tapping his feet on a step without R knee hyperextension throwing off his balance to demo improving LE Stability for safe stair and curb negotiation Baseline:  Goal status:  MET with v/c  4.  Pt will have improved LEFS score to >/=46/80 to demo MCID Baseline: 33/80 on 01/07/24;  22/80 ON 01/23/24 Goal status: NOT MET  PLAN:/  PT FREQUENCY: 2x/week  PT DURATION: 8 weeks  PLANNED INTERVENTIONS: 97164- PT Re-evaluation, 97110-Therapeutic exercises, 97530- Therapeutic activity, 97112- Neuromuscular re-education, 97535- Self Care, 02859- Manual therapy, [phone]- Gait training, [phone]- Aquatic Therapy, [phone]- Electrical stimulation (unattended), [phone]- Ionotophoresis 4mg /ml Dexamethasone , Patient/Family education, Balance training, Stair training, Taping, Dry Needling, Joint  mobilization, Spinal mobilization, Cryotherapy, and Moist heat  PLAN FOR NEXT SESSION: D/C PT    Melaine Mcphee, PT 01/23/2024, 12:44 PM

## 2024-01-23 NOTE — Progress Notes (Signed)
 Anti-CCP negative.

## 2024-01-25 NOTE — Progress Notes (Signed)
 MCV negative.  Labs are not consistent with rheumatoid arthritis.

## 2024-03-24 NOTE — Therapy (Signed)
 OUTPATIENT OCCUPATIONAL THERAPY PARKINSON'S EVALUATION  Patient Name: Jim Carson MRN: [id number] DOB:06/10/1951, 73 y.o., male Today's Date: 03/25/2024  PCP: Jim Slough, MD REFERRING PROVIDER: Georgina Collar MD  END OF SESSION:  OT End of Session - 03/25/24 1438     Visit Number 1    Number of Visits 25    Date for Recertification  06/17/24    Authorization Type Medicare    Authorization Time Period 12 weeks, anticipate d/c after 8 weeks    Authorization - Visit Number 1    Progress Note Due on Visit 10    OT Start Time 1236    OT Stop Time 1315    OT Time Calculation (min) 39 min    Activity Tolerance Patient tolerated treatment well    Behavior During Therapy Jim Carson for tasks assessed/performed          Past Medical History:  Diagnosis Date   Abnormal gait    due to parkinson's,  uses cane   Absolute anemia 03/13/2023   Allergic rhinitis    Allergy Spring 1998   Allergic to dustmits, dog hair and roaches.   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 glendia  @[facility]  [facility]   Pneumonia    Sleep apnea    Vitamin B12 deficiency    Vitamin D  deficiency    Wears glasses    Past Surgical History:  Procedure Laterality Date   APPENDECTOMY  07/1964   APPENDECTOMY     CHOLECYSTECTOMY     JOINT REPLACEMENT  March 2019 and again on 09/10/2021   Left hip twice.  2nd time due to 3 dislocations   LAPAROSCOPIC CHOLECYSTECTOMY  01-21-2017   @[facility] , [place]   lower back surgery      POSTERIOR  CERVICAL FUSION/FORAMINOTOMY     POSTERIOR FUSION CERVICAL SPINE  08-15-2011   @[facility] , [place]   w/ laminectomy and decompression-- C3 -- T1   POSTERIOR LAMINECTOMY / DECOMPRESSION LUMBAR SPINE  07-30-2013   @[facility] , [place]   bilateral T11 - 12,  L2 --5   REMOVAL LEFT T1 PARASPINOUS MASS  02-12-2016   @[facility] ,  [place]   desmoid fibromatosis   SHOULDER ARTHROSCOPY WITH ROTATOR CUFF REPAIR Left 12/15/2018   Procedure: SHOULDER ARTHROSCOPY, biceps tenodesis labored Debridement, submacromial decompression;  Surgeon: Gerome Lamar, MD;  Location: [facility];  Service: Orthopedics;  Laterality: Left;  interscalene block   SPINE SURGERY     TOTAL HIP ARTHROPLASTY Left 09/18/2017   @[facility] , [place]   TOTAL HIP REVISION Left 09/10/2021   Procedure: Left hip acetabular versus total hip arthroplasty revision, constrained liner;  Surgeon: Melodi Lerner, MD;  Location: [facility];  Service: Orthopedics;  Laterality: Left;   TOTAL HIP REVISION Left 10/01/2023   Procedure: Left hip constrained liner revision;  Surgeon: Melodi Lerner, MD;  Location: [facility];  Service: Orthopedics;  Laterality: Left;    Patient Active Problem  List   Diagnosis Date Noted   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   Thrombocytopenia 03/17/2023   Absolute anemia 03/13/2023   Family history of prostate cancer 03/13/2023   History of elevated PSA 03/13/2023   AV block, 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   Drug induced constipation 12/30/2022   Acquired hypothyroidism 12/30/2022   Primary osteoarthritis of left hip 05/18/2021   PD (Parkinson's disease) (HCC) 05/18/2021   History of gastroesophageal reflux (GERD) 05/18/2021   OSA (obstructive sleep apnea) 05/18/2021   Essential hypertension 05/18/2021    ONSET DATE: referral 12/09/23  REFERRING DIAG:  Diagnosis  G20.B2 (ICD-10-CM) - Parkinson's disease with dyskinesia, with fluctuations    THERAPY DIAG:  Other  lack of coordination - Plan: Ot plan of care cert/re-cert  Muscle weakness (generalized) - Plan: Ot plan of care cert/re-cert  Unsteadiness on feet - Plan: Ot plan of care cert/re-cert  Other abnormalities of gait and mobility - Plan: Ot plan of care cert/re-cert  Abnormal posture - Plan: Ot plan of care cert/re-cert  Other symptoms and signs involving the nervous system - Plan: Ot plan of care cert/re-cert  Stiffness of left elbow, not elsewhere classified - Plan: Ot plan of care cert/re-cert  Stiffness of right elbow, not elsewhere classified - Plan: Ot plan of care cert/re-cert  Rationale for Evaluation and Treatment: Rehabilitation  SUBJECTIVE:   SUBJECTIVE STATEMENT: Pt reports he hasn't had OT in a few years Pt accompanied by: wife Jim Carson by [name]  PERTINENT HISTORY: Pt reports he had a L hip replacement in 2019 and dislocated it 3 times. Re did it 2023, had a fall and jammed it and had to revise it 10/01/23. Was told not to go too heavy over 110 lbs on the leg press. Was told not to cross his legs  PMH: Parkinson's Disease, hypothyroidism, hx of multiple concussions, GERD, benign essential HTN, abnormal gait, L THA 2019, removal of T1 paraspinous mass 2017, T11-12 and L2-5 lumbar lami/decompression 2015, cervical fusion C3-T1 2013, hx of shoulder surgery June 2020, hip surgery revision 10/01/23 after multiple dislocations  PRECAUTIONS: Other: hip revision, told not to cross legs  WEIGHT BEARING RESTRICTIONS: No  PAIN:  Are you having pain? Yes: NPRS scale: 3/10 Pain location: R wrist Pain description: aching Aggravating factors: wrist extension Relieving factors: rest  FALLS: Has patient fallen in last 6 months? Yes. Number of falls 3, Pt saw PT to address  LIVING ENVIRONMENT: Lives with: lives with their family and lives with their spouse Lives in: House/apartment  Has following equipment at home: Environmental consultant - 4 wheeled and shower chair  PLOF: Independent with  basic ADLs  PATIENT GOALS: improve grip and coordination, wants to play guitar better  OBJECTIVE:  Note: Objective measures were completed at Evaluation unless otherwise noted.  HAND DOMINANCE: Left  ADLs:    Overall ADLs: increased time required Transfers/ambulation related to ADLs: Eating: difficulty holding knife to cut food  Grooming: mod I UB Dressing:needs assist with buttons at times LB Dressing: min A at times with socks Toileting: mod  Bathing: mod I Tub Shower transfers: mod I, walk in shower with seat Equipment: Shower seat with back and Walk in shower Has an up walker at home IADLs:  Light housekeeping: Pt does laundry, and cleans up after meals  Meal Prep: wife prepares Community mobility: mod I with rollator Medication management: keeps up with meds Financial management: keeps up with finanaces Handwriting: 50% legible and Moderate micrographia  MOBILITY STATUS: Independent  POSTURE COMMENTS:  rounded shoulders, forward head, and flexed trunk    FUNCTIONAL OUTCOME MEASURES: Fastening/unfastening 3 buttons: 1 min 29 secs Physical performance test: PPT#2 (simulated eating) 14.34 with 1 drop & PPT#4 (donning/doffing jacket): NT  COORDINATION: 9 Hole Peg test: Right: 2 mins.06 sec multiple drops; Left: 43.83 sec Box and Blocks:  Right 40blocks, Left 47blocks  UE ROM:  RUE shoulder flexion 120, elbow extension -20, LUE shoulder flexion 130, elbow extension -20, decreased bilateral grip/ composite flexion due to arthritis     COGNITION: Overall cognitive status: Within functional limits for tasks assessed  OBSERVATIONS: Bradykinesia                                                                                                                    TREATMENT DATE: 03/25/24- eval    PATIENT EDUCATION: Education details: role of OT Person educated: Patient and Spouse Education method: Explanation Education comprehension: verbalized  understanding  HOME EXERCISE PROGRAM: n/a  GOALS: Goals reviewed with patient? Yes  SHORT TERM GOALS: Target date: 04/23/24   I with PD specific HEP  Goal status: INITIAL  2.  I with adapted strategies/AE to maximize safety and I with ADLs/IADLs.  Goal status: INITIAL  3.   Pt will demonstrate improved fine motor coordination for ADLs as evidenced by decreasing 9 hole peg test score for RUE to 2 mins or less with out drops Baseline:  Goal status: INITIAL  4.   Pt will demonstrate improved fine motor coordination for ADLs as evidenced by decreasing 9 hole peg test score for LUE by 3 secs Baseline: LUE 43.83 secs  Goal status: INITIAL  5.  Pt will demonstrate improved ease with feeding as evidenced by decreasing PPT#2 to 11.34 without drops Baseline:  Goal status: INITIAL  6.  Pt will write a  sentence with 100% legibility and minimal decrease in letter size  Goal status: INITIAL  LONG TERM GOALS: Target date: 06/17/24  Pt will demonstrate improved ease with fastening buttons as evidenced by decreasing 3 button/ unbutton time to :1 min 20 secs or less Baseline: 1 min 29 secs Goal status: INITIAL  2.  Pt will demonstrate ability to retrieve a lightweight object at 125 shoulder flexion and -15 elbow extension with RUE Goal status: INITIAL  3. Pt will demonstrate ability to retrieve a lightweight object at 130 shoulder flexion and -15 elbow extension with LUE Goal status: INITIAL  4.  Pt will verbalize understanding of ways to prevent future PD related complications and PD community resources.  Goal status: INITIAL  5.  Check PPT#4 and set goal prn Baseline:  Goal status: INITIAL  6.  Pt will report dropping utensils. and  cups less frequently  Goal status: INITIAL ASSESSMENT:  CLINICAL IMPRESSION: Patient is a 73 y.o. male who was seen today for occupational therapy evaluation for Parkinson's disease.PMH: hypothyroidism, hx of multiple concussions, GERD,  benign essential HTN, abnormal gait, L THA 2019, removal of T1 paraspinous mass 2017,  T11-12 and L2-5 lumbar lami/decompression 2015, cervical fusion C3-T1 2013, hx of shoulder surgery June 2020, hip surgery revision 10/01/23 after multiple dislocations. pt is known to this therapsit from previous OT in 2021/2022. Pt reports he has not had OT since that time.Py presents with the performance deficits belwo. He can benefit from skilled occupational therapy to address these deficits in order to maximize pt's safety and I with ADLs/IADLs.   PERFORMANCE DEFICITS: in functional skills including ADLs, IADLs, coordination, dexterity, ROM, strength, flexibility, Fine motor control, Gross motor control, mobility, balance, decreased knowledge of precautions, decreased knowledge of use of DME, and UE functional use, , and psychosocial skills including coping strategies, environmental adaptation, habits, interpersonal interactions, and routines and behaviors.   IMPAIRMENTS: are limiting patient from ADLs, IADLs, rest and sleep, play, leisure, and social participation.   COMORBIDITIES:  may have co-morbidities  that affects occupational performance. Patient will benefit from skilled OT to address above impairments and improve overall function.  MODIFICATION OR ASSISTANCE TO COMPLETE EVALUATION: No modification of tasks or assist necessary to complete an evaluation.  OT OCCUPATIONAL PROFILE AND HISTORY: Detailed assessment: Review of records and additional review of physical, cognitive, psychosocial history related to current functional performance.  CLINICAL DECISION MAKING: LOW - limited treatment options, no task modification necessary  REHAB POTENTIAL: Good  EVALUATION COMPLEXITY: Low    PLAN:  OT FREQUENCY: 2x/week  OT DURATION: 12 weeks- anticipate d/c after 8 weeks  PLANNED INTERVENTIONS: 97168 OT Re-evaluation, 97535 self care/ADL training, 02889 therapeutic exercise, 97530 therapeutic activity,  97112 neuromuscular re-education, 97140 manual therapy, 97035 ultrasound, 97018 paraffin, 02989 moist heat, 97010 cryotherapy, 97760 Orthotic Initial, 97763 Orthotic/Prosthetic subsequent, passive range of motion, balance training, functional mobility training, energy conservation, coping strategies training, patient/family education, and DME and/or AE instructions  RECOMMENDED OTHER SERVICES: n/a  CONSULTED AND AGREED WITH PLAN OF CARE: Patient and family member/caregiver- wife Jim GORY FOR NEXT SESSION: check PPT#4 and set goal, inital HEP for coordination   Kwaku Mostafa, OT 03/25/2024, 2:50 PM

## 2024-03-25 ENCOUNTER — Ambulatory Visit: Attending: Neurology | Admitting: Occupational Therapy

## 2024-03-25 ENCOUNTER — Encounter: Payer: Self-pay | Admitting: Occupational Therapy

## 2024-03-25 ENCOUNTER — Other Ambulatory Visit: Payer: Self-pay

## 2024-03-25 DIAGNOSIS — M25621 Stiffness of right elbow, not elsewhere classified: Secondary | ICD-10-CM | POA: Diagnosis present

## 2024-03-25 DIAGNOSIS — R278 Other lack of coordination: Secondary | ICD-10-CM | POA: Diagnosis present

## 2024-03-25 DIAGNOSIS — R2681 Unsteadiness on feet: Secondary | ICD-10-CM | POA: Diagnosis present

## 2024-03-25 DIAGNOSIS — R293 Abnormal posture: Secondary | ICD-10-CM | POA: Diagnosis present

## 2024-03-25 DIAGNOSIS — M6281 Muscle weakness (generalized): Secondary | ICD-10-CM | POA: Diagnosis present

## 2024-03-25 DIAGNOSIS — M25622 Stiffness of left elbow, not elsewhere classified: Secondary | ICD-10-CM | POA: Insufficient documentation

## 2024-03-25 DIAGNOSIS — R29818 Other symptoms and signs involving the nervous system: Secondary | ICD-10-CM | POA: Diagnosis present

## 2024-03-25 DIAGNOSIS — R2689 Other abnormalities of gait and mobility: Secondary | ICD-10-CM | POA: Diagnosis present

## 2024-03-30 ENCOUNTER — Ambulatory Visit: Admitting: Occupational Therapy

## 2024-03-30 ENCOUNTER — Encounter: Payer: Self-pay | Admitting: Occupational Therapy

## 2024-03-30 DIAGNOSIS — M25622 Stiffness of left elbow, not elsewhere classified: Secondary | ICD-10-CM

## 2024-03-30 DIAGNOSIS — R2689 Other abnormalities of gait and mobility: Secondary | ICD-10-CM

## 2024-03-30 DIAGNOSIS — R278 Other lack of coordination: Secondary | ICD-10-CM

## 2024-03-30 DIAGNOSIS — R2681 Unsteadiness on feet: Secondary | ICD-10-CM

## 2024-03-30 DIAGNOSIS — R293 Abnormal posture: Secondary | ICD-10-CM

## 2024-03-30 DIAGNOSIS — M6281 Muscle weakness (generalized): Secondary | ICD-10-CM

## 2024-03-30 DIAGNOSIS — R29818 Other symptoms and signs involving the nervous system: Secondary | ICD-10-CM

## 2024-03-30 DIAGNOSIS — M25621 Stiffness of right elbow, not elsewhere classified: Secondary | ICD-10-CM

## 2024-03-30 NOTE — Therapy (Signed)
 OUTPATIENT OCCUPATIONAL THERAPY PARKINSON'S EVALUATION  Patient Name: Jim Carson MRN: [id number] DOB:1950/09/10, 73 y.o., male Today's Date: 03/30/2024  PCP: Abran Slough, MD REFERRING PROVIDER: Georgina Collar MD  END OF SESSION:  OT End of Session - 03/30/24 1217     Visit Number 2    Number of Visits 25    Date for Recertification  06/17/24    Authorization Type Medicare    Authorization Time Period 12 weeks, anticipate d/c after 8 weeks    Authorization - Visit Number 2    Progress Note Due on Visit 10    OT Start Time 1103    OT Stop Time 1145    OT Time Calculation (min) 42 min           Past Medical History:  Diagnosis Date   Abnormal gait    due to parkinson's,  uses cane   Absolute anemia 03/13/2023   Allergic rhinitis    Allergy Spring 1998   Allergic to dustmits, dog hair and roaches.   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 glendia  @[facility]   Pneumonia    Sleep apnea    Vitamin B12 deficiency    Vitamin D  deficiency    Wears glasses    Past Surgical History:  Procedure Laterality Date   APPENDECTOMY  07/1964   APPENDECTOMY     CHOLECYSTECTOMY     JOINT REPLACEMENT  March 2019 and again on 09/10/2021   Left hip twice.  2nd time due to 3 dislocations   LAPAROSCOPIC CHOLECYSTECTOMY  01-21-2017   @[facility] , [place]   lower back surgery      POSTERIOR CERVICAL FUSION/FORAMINOTOMY     POSTERIOR FUSION CERVICAL SPINE  08-15-2011   @[facility] , [place]   w/ laminectomy and  decompression-- C3 -- T1   POSTERIOR LAMINECTOMY / DECOMPRESSION LUMBAR SPINE  07-30-2013   @[facility] , [place]   bilateral T11 - 12,  L2 --5   REMOVAL LEFT T1 PARASPINOUS MASS  02-12-2016   @[facility] ,  [place]   desmoid fibromatosis   SHOULDER ARTHROSCOPY WITH ROTATOR CUFF REPAIR Left 12/15/2018   Procedure: SHOULDER ARTHROSCOPY, biceps tenodesis labored Debridement, submacromial decompression;  Surgeon: Gerome Lamar, MD;  Location: [facility];  Service: Orthopedics;  Laterality: Left;  interscalene block   SPINE SURGERY     TOTAL HIP ARTHROPLASTY Left 09/18/2017   @[facility] , [place]   TOTAL HIP REVISION Left 09/10/2021   Procedure: Left hip acetabular versus total hip arthroplasty revision, constrained liner;  Surgeon: Melodi Lerner, MD;  Location: [facility];  Service: Orthopedics;  Laterality: Left;   TOTAL HIP REVISION Left 10/01/2023   Procedure: Left hip constrained liner revision;  Surgeon: Melodi Lerner, MD;  Location: [facility];  Service: Orthopedics;  Laterality: Left;    Patient Active Problem List   Diagnosis Date Noted   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   Thrombocytopenia 03/17/2023  Absolute anemia 03/13/2023   Family history of prostate cancer 03/13/2023   History of elevated PSA 03/13/2023   AV block, 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   Drug induced constipation 12/30/2022   Acquired hypothyroidism 12/30/2022   Primary osteoarthritis of left hip 05/18/2021   PD (Parkinson's disease) (HCC) 05/18/2021   History of gastroesophageal reflux (GERD) 05/18/2021   OSA (obstructive sleep apnea) 05/18/2021   Essential hypertension 05/18/2021    ONSET DATE: referral 12/09/23  REFERRING DIAG:  Diagnosis  G20.B2 (ICD-10-CM) - Parkinson's disease with dyskinesia, with fluctuations    THERAPY DIAG:  Muscle weakness (generalized)  Unsteadiness on feet  Other abnormalities of gait and mobility  Abnormal  posture  Other symptoms and signs involving the nervous system  Other lack of coordination  Stiffness of left elbow, not elsewhere classified  Stiffness of right elbow, not elsewhere classified  Rationale for Evaluation and Treatment: Rehabilitation  SUBJECTIVE:   SUBJECTIVE STATEMENT: Pt reports no pain Pt accompanied by: wife [name] by [name]  PERTINENT HISTORY: Pt reports he had a L hip replacement in 2019 and dislocated it 3 times. Re did it 2023, had a fall and jammed it and had to revise it 10/01/23. Was told not to go too heavy over 110 lbs on the leg press. Was told not to cross his legs  PMH: Parkinson's Disease, hypothyroidism, hx of multiple concussions, GERD, benign essential HTN, abnormal gait, L THA 2019, removal of T1 paraspinous mass 2017, T11-12 and L2-5 lumbar lami/decompression 2015, cervical fusion C3-T1 2013, hx of shoulder surgery June 2020, hip surgery revision 10/01/23 after multiple dislocations  PRECAUTIONS: Other: hip revision, told not to cross legs  WEIGHT BEARING RESTRICTIONS: No  PAIN:  Are you having pain? Yes: NPRS scale: 3/10 Pain location: R wrist Pain description: aching Aggravating factors: wrist extension Relieving factors: rest  FALLS: Has patient fallen in last 6 months? Yes. Number of falls 3, Pt saw PT to address  LIVING ENVIRONMENT: Lives with: lives with their family and lives with their spouse Lives in: House/apartment  Has following equipment at home: Environmental consultant - 4 wheeled and shower chair  PLOF: Independent with basic ADLs  PATIENT GOALS: improve grip and coordination, wants to play guitar better  OBJECTIVE:  Note: Objective measures were completed at Evaluation unless otherwise noted.  HAND DOMINANCE: Left  ADLs:    Overall ADLs: increased time required Transfers/ambulation related to ADLs: Eating: difficulty holding knife to cut food  Grooming: mod I UB Dressing:needs assist with buttons at times LB Dressing: min  A at times with socks Toileting: mod  Bathing: mod I Tub Shower transfers: mod I, walk in shower with seat Equipment: Shower seat with back and Walk in shower Has an up walker at home IADLs:  Light housekeeping: Pt does laundry, and cleans up after meals  Meal Prep: wife prepares Community mobility: mod I with rollator Medication management: keeps up with meds Financial management: keeps up with finanaces Handwriting: 50% legible and Moderate micrographia  MOBILITY STATUS: Independent  POSTURE COMMENTS:  rounded shoulders, forward head, and flexed trunk    FUNCTIONAL OUTCOME MEASURES: Fastening/unfastening 3 buttons: 1 min 29 secs Physical performance test: PPT#2 (simulated eating) 14.34 with 1 drop & PPT#4 (donning/doffing jacket): NT  COORDINATION: 9 Hole Peg test: Right: 2 mins.06 sec multiple drops; Left: 43.83 sec Box and Blocks:  Right 40blocks, Left 47blocks  UE ROM:  RUE shoulder flexion 120, elbow extension -20, LUE shoulder flexion 130,  elbow extension -20, decreased bilateral grip/ composite flexion due to arthritis     COGNITION: Overall cognitive status: Within functional limits for tasks assessed  OBSERVATIONS: Bradykinesia                                                                                                                    TREATMENT DATE: 03/30/24- Supine, towel under low back, roll under knees and 2 pillows under head(hx of neck fusion) PWR! up with min/ mod facilitation followed by cane exercises for chest press and shoulder flexion, mod v.c and facillitation/ v.c for keeping shoulders down and back, elbow extension Seated PWR! up and PWR! rock 10 reps each, min v.c Seated at table PWR! hands basic 4, min-mod v.c Flipping and dealing playing cards with big movements, min-mod v.c and demonstration Reviewed safety for sit-stand from a chair, slowing down and avoiding twisting to protect hip.  03/25/24- eval    PATIENT EDUCATION: Education  details: supine cane exercises Person educated: Patient Education method: Explanation, Demonstration, Tactile cues, Verbal cues, and Handouts Education comprehension: verbalized understanding, returned demonstration, verbal cues required, tactile cues required, and needs further education   HOME EXERCISE PROGRAM:  03/30/24-cane exercises in supine  GOALS: Goals reviewed with patient? Yes  SHORT TERM GOALS: Target date: 04/23/24   I with PD specific HEP  Goal status: ongoing 03/30/24  2.  I with adapted strategies/AE to maximize safety and I with ADLs/IADLs.  Goal status: INITIAL  3.   Pt will demonstrate improved fine motor coordination for ADLs as evidenced by decreasing 9 hole peg test score for RUE to 2 mins or less with out drops Baseline:  Goal status: INITIAL  4.   Pt will demonstrate improved fine motor coordination for ADLs as evidenced by decreasing 9 hole peg test score for LUE by 3 secs Baseline: LUE 43.83 secs  Goal status: INITIAL  5.  Pt will demonstrate improved ease with feeding as evidenced by decreasing PPT#2 to 11.34 without drops Baseline:  Goal status: INITIAL  6.  Pt will write a  sentence with 100% legibility and minimal decrease in letter size  Goal status: INITIAL  LONG TERM GOALS: Target date: 06/17/24  Pt will demonstrate improved ease with fastening buttons as evidenced by decreasing 3 button/ unbutton time to :1 min 20 secs or less Baseline: 1 min 29 secs Goal status: INITIAL  2.  Pt will demonstrate ability to retrieve a lightweight object at 125 shoulder flexion and -15 elbow extension with RUE Goal status: INITIAL  3. Pt will demonstrate ability to retrieve a lightweight object at 130 shoulder flexion and -15 elbow extension with LUE Goal status: INITIAL  4.  Pt will verbalize understanding of ways to prevent future PD related complications and PD community resources.  Goal status: INITIAL  5.  Check PPT#4 and set goal  prn Baseline:  Goal status: INITIAL  6.  Pt will report dropping utensils. and  cups less frequently  Goal status: INITIAL ASSESSMENT:  CLINICAL IMPRESSION:  Patient is progressing towards goals. He demonstrates understanding of inital HEP for supine cane. PERFORMANCE DEFICITS: in functional skills including ADLs, IADLs, coordination, dexterity, ROM, strength, flexibility, Fine motor control, Gross motor control, mobility, balance, decreased knowledge of precautions, decreased knowledge of use of DME, and UE functional use, , and psychosocial skills including coping strategies, environmental adaptation, habits, interpersonal interactions, and routines and behaviors.   IMPAIRMENTS: are limiting patient from ADLs, IADLs, rest and sleep, play, leisure, and social participation.   COMORBIDITIES:  may have co-morbidities  that affects occupational performance. Patient will benefit from skilled OT to address above impairments and improve overall function.  MODIFICATION OR ASSISTANCE TO COMPLETE EVALUATION: No modification of tasks or assist necessary to complete an evaluation.  OT OCCUPATIONAL PROFILE AND HISTORY: Detailed assessment: Review of records and additional review of physical, cognitive, psychosocial history related to current functional performance.  CLINICAL DECISION MAKING: LOW - limited treatment options, no task modification necessary  REHAB POTENTIAL: Good  EVALUATION COMPLEXITY: Low    PLAN:  OT FREQUENCY: 2x/week  OT DURATION: 12 weeks- anticipate d/c after 8 weeks  PLANNED INTERVENTIONS: 97168 OT Re-evaluation, 97535 self care/ADL training, 02889 therapeutic exercise, 97530 therapeutic activity, 97112 neuromuscular re-education, 97140 manual therapy, 97035 ultrasound, 97018 paraffin, 02989 moist heat, 97010 cryotherapy, 97760 Orthotic Initial, 97763 Orthotic/Prosthetic subsequent, passive range of motion, balance training, functional mobility training, energy  conservation, coping strategies training, patient/family education, and DME and/or AE instructions  RECOMMENDED OTHER SERVICES: n/a  CONSULTED AND AGREED WITH PLAN OF CARE: Patient and family Adult nurse- wife Rock GORY FOR NEXT SESSION: Hx of multiple hip dislocations, exercise caution with LE movement, twisting, AE for feeding, PWR! hands/ coordination, can review cane ex HEP check PPT#4 if time allows and set goal,   Marck Mcclenny, OT 03/30/2024, 12:23 PM

## 2024-03-30 NOTE — Patient Instructions (Signed)
 Lay on your back on bed with towel under low back and 2 pillows under head so that you are comfortable.     Shoulder: Flexion (Supine)    With hands shoulder width apart, slowly lower dowel to floor to shoulder height. Do not let elbows bend, keep shoulders down Keep back flat. Hold __5__ seconds. Repeat _10-20___ times. Do __1__ sessions per day. CAUTION: Stretch slowly and gently.  Repeat Supine: Chest Press (Active)    Lie on back with arms fully extended. Lower bar or dowel slowly to chest and press to arm's length.Complete _1__ sets of _10__ repetitions. Perform _1__ sessions per day.  Copyright  VHI. All rights reserved.

## 2024-04-01 NOTE — Therapy (Signed)
 OUTPATIENT OCCUPATIONAL THERAPY PARKINSON'S TREATMENT  Patient Name: Jim Carson MRN: [id number] DOB:1951-03-28, 73 y.o., male Today's Date: 04/02/2024  PCP: Jim Slough, MD REFERRING PROVIDER: Georgina Collar MD  END OF SESSION:  OT End of Session - 04/02/24 1110     Visit Number 3    Number of Visits 25    Date for Recertification  06/17/24    Authorization Type Medicare    Authorization Time Period 12 weeks, anticipate d/c after 8 weeks    Authorization - Visit Number 3    Progress Note Due on Visit 10    OT Start Time 1108    OT Stop Time 1146    OT Time Calculation (min) 38 min    Activity Tolerance Patient tolerated treatment well    Behavior During Therapy WFL for tasks assessed/performed            Past Medical History:  Diagnosis Date   Abnormal gait    due to parkinson's,  uses cane   Absolute anemia 03/13/2023   Allergic rhinitis    Allergy Spring 1998   Allergic to dustmits, dog hair and roaches.   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   GERD (gastroesophageal reflux disease)    History of multiple concussions    per pt as teen playing football, no residual   Hypothyroidism    followed by pcp   Left shoulder pain    Neuromuscular disorder (HCC) 07/04/2012   Parkinson's Disease   OA (osteoarthritis)    OSA on CPAP    Parkinson disease [facility])    neurologist-- dr [facility]  @[facility]   Pneumonia    Sleep apnea    Vitamin B12 deficiency    Vitamin D  deficiency    Wears glasses    Past Surgical History:  Procedure Laterality Date   APPENDECTOMY  07/1964   APPENDECTOMY     CHOLECYSTECTOMY     JOINT REPLACEMENT  March 2019 and again on 09/10/2021   Left hip twice.  2nd time due to 3 dislocations   LAPAROSCOPIC CHOLECYSTECTOMY  01-21-2017   @[facility] , [facility]   lower back surgery      POSTERIOR  CERVICAL FUSION/FORAMINOTOMY     POSTERIOR FUSION CERVICAL SPINE  08-15-2011   @[facility] , [facility]   w/ laminectomy and decompression-- C3 -- T1   POSTERIOR LAMINECTOMY / DECOMPRESSION LUMBAR SPINE  07-30-2013   @[facility] , [facility]   bilateral T11 - 12,  L2 --5   REMOVAL LEFT T1 PARASPINOUS MASS  02-12-2016   @[facility] ,  [facility]   desmoid fibromatosis   SHOULDER ARTHROSCOPY WITH ROTATOR CUFF REPAIR Left 12/15/2018   Procedure: SHOULDER ARTHROSCOPY, biceps tenodesis labored Debridement, submacromial decompression;  Surgeon: Jim Lamar, MD;  Location: [facility];  Service: Orthopedics;  Laterality: Left;  interscalene block   SPINE SURGERY     TOTAL HIP ARTHROPLASTY Left 09/18/2017   @[facility] , [facility]   TOTAL HIP REVISION Left 09/10/2021   Procedure: Left hip acetabular versus total hip arthroplasty revision, constrained liner;  Surgeon: Jim Lerner, MD;  Location: [facility];  Service: Orthopedics;  Laterality: Left;   TOTAL HIP REVISION Left 10/01/2023   Procedure: Left hip constrained liner revision;  Surgeon: Jim Lerner, MD;  Location: [facility];  Service: Orthopedics;  Laterality: Left;    Patient  Active Problem List   Diagnosis Date Noted   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   Thrombocytopenia 03/17/2023   Absolute anemia 03/13/2023   Family history of prostate cancer 03/13/2023   History of elevated PSA 03/13/2023   AV block, 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   Drug induced constipation 12/30/2022   Acquired hypothyroidism 12/30/2022   Primary osteoarthritis of left hip 05/18/2021   PD (Parkinson's disease) (HCC) 05/18/2021   History of gastroesophageal reflux (GERD) 05/18/2021   OSA (obstructive sleep apnea) 05/18/2021   Essential hypertension 05/18/2021    ONSET DATE: referral 12/09/23  REFERRING DIAG:  Diagnosis  G20.B2 (ICD-10-CM) - Parkinson's disease with dyskinesia, with fluctuations    THERAPY DIAG:  Other  symptoms and signs involving the nervous system  Other lack of coordination  Stiffness of left elbow, not elsewhere classified  Stiffness of right elbow, not elsewhere classified  Unsteadiness on feet  Abnormal posture  Rationale for Evaluation and Treatment: Rehabilitation  SUBJECTIVE:   SUBJECTIVE STATEMENT: My (right wrist) is bad with arthritis   Pt reports no pain  Pt accompanied by: wife Jim Carson by [facility]  PERTINENT HISTORY: Pt reports he had a L hip replacement in 2019 and dislocated it 3 times. Re did it 2023, had a fall and jammed it and had to revise it 10/01/23. Was told not to go too heavy over 110 lbs on the leg press. Was told not to cross his legs  PMH: Parkinson's Disease, hypothyroidism, hx of multiple concussions, GERD, benign essential HTN, abnormal gait, L THA 2019, removal of T1 paraspinous mass 2017, T11-12 and L2-5 lumbar lami/decompression 2015, cervical fusion C3-T1 2013, hx of shoulder surgery June 2020, hip surgery revision 10/01/23 after multiple dislocations  PRECAUTIONS: Other: hip revision, told not to cross legs  WEIGHT BEARING RESTRICTIONS: No  PAIN:  Are you having pain? Yes: NPRS scale: 3/10 Pain location: R wrist Pain description: aching Aggravating factors: wrist extension Relieving factors: rest  FALLS: Has patient fallen in last 6 months? Yes. Number of falls 3, Pt saw PT to address  LIVING ENVIRONMENT: Lives with: lives with their family and lives with their spouse Lives in: House/apartment  Has following equipment at home: Environmental consultant - 4 wheeled and shower chair  PLOF: Independent with basic ADLs  PATIENT GOALS: improve grip and coordination, wants to play guitar better  OBJECTIVE:  Note: Objective measures were completed at Evaluation unless otherwise noted.  HAND DOMINANCE: Left  ADLs:  Overall ADLs: increased time required Transfers/ambulation related to ADLs: Eating: difficulty holding knife to cut food  Grooming: mod  I UB Dressing:needs assist with buttons at times LB Dressing: min A at times with socks Toileting: mod  Bathing: mod I Tub Shower transfers: mod I, walk in shower with seat Equipment: Shower seat with back and Walk in shower Has an up walker at home IADLs:  Light housekeeping: Pt does laundry, and cleans up after meals  Meal Prep: wife prepares Community mobility: mod I with rollator Medication management: keeps up with meds Financial management: keeps up with finanaces Handwriting: 50% legible and Moderate micrographia  MOBILITY STATUS: Independent  POSTURE COMMENTS:  rounded shoulders, forward head, and flexed trunk    FUNCTIONAL OUTCOME MEASURES: Fastening/unfastening 3 buttons: 1 min 29 secs Physical performance test: PPT#2 (simulated eating) 14.34 with 1 drop & PPT#4 (donning/doffing jacket): NT  COORDINATION: 9 Hole Peg test: Right: 2 mins.06 sec multiple drops; Left: 43.83 sec Box and Blocks:  Right  40 blocks, Left 47blocks  UE ROM:  RUE shoulder flexion 120, elbow extension -20, LUE shoulder flexion 130, elbow extension -20, decreased bilateral grip/ composite flexion due to arthritis  COGNITION: Overall cognitive status: Within functional limits for tasks assessed  OBSERVATIONS: Bradykinesia                                                                                                                    TREATMENT DATE:   04/02/24:  Pt instructed in  PWR! Hands (basic 4) and coordination HEP with focus on large amplitude movements and returned demo with min-mod cueing for positioning, to slow down, and large amplitude movements.    03/30/24- Supine, towel under low back, roll under knees and 2 pillows under head(hx of neck fusion) PWR! up with min/ mod facilitation followed by cane exercises for chest press and shoulder flexion, mod v.c and facillitation/ v.c for keeping shoulders down and back, elbow extension Seated PWR! up and PWR! Jim 10 reps each, min  v.c Seated at table PWR! hands basic 4, min-mod v.c Flipping and dealing playing cards with big movements, min-mod v.c and demonstration Reviewed safety for sit-stand from a chair, slowing down and avoiding twisting to protect hip.  03/25/24- eval    PATIENT EDUCATION: Education details: PWR! Hands, Coordination HEP with focus on large amplitude  Person educated: Patient Education method: Explanation, Demonstration, Tactile cues, Verbal cues, and Handouts Education comprehension: verbalized understanding, returned demonstration, verbal cues required, tactile cues required, and needs further education   HOME EXERCISE PROGRAM:  03/30/24-cane exercises in supine 04/02/24:  PWR! Hands, Coordination HEP with focus on large amplitude   GOALS: Goals reviewed with patient? Yes  SHORT TERM GOALS: Target date: 04/23/24   I with PD specific HEP  Goal status: ongoing 03/30/24  2.  I with adapted strategies/AE to maximize safety and I with ADLs/IADLs.  Goal status: INITIAL  3.   Pt will demonstrate improved fine motor coordination for ADLs as evidenced by decreasing 9 hole peg test score for RUE to 2 mins or less with out drops Baseline:  Goal status: INITIAL  4.   Pt will demonstrate improved fine motor coordination for ADLs as evidenced by decreasing 9 hole peg test score for LUE by 3 secs Baseline: LUE 43.83 secs  Goal status: INITIAL  5.  Pt will demonstrate improved ease with feeding as evidenced by decreasing PPT#2 to 11.34 without drops Baseline:  Goal status: INITIAL  6.  Pt will write a  sentence with 100% legibility and minimal decrease in letter size  Goal status: INITIAL  LONG TERM GOALS: Target date: 06/17/24  Pt will demonstrate improved ease with fastening buttons as evidenced by decreasing 3 button/ unbutton time to :1 min 20 secs or less Baseline: 1 min 29 secs Goal status: INITIAL  2.  Pt will demonstrate ability to retrieve a lightweight object at 125 shoulder  flexion and -15 elbow extension with RUE Goal status: INITIAL  3. Pt will demonstrate ability to retrieve  a lightweight object at 130 shoulder flexion and -15 elbow extension with LUE Goal status: INITIAL  4.  Pt will verbalize understanding of ways to prevent future PD related complications and PD community resources.  Goal status: INITIAL  5.  Check PPT#4 and set goal prn Baseline:  Goal status: INITIAL  6.  Pt will report dropping utensils. and  cups less frequently  Goal status: INITIAL ASSESSMENT:  CLINICAL IMPRESSION: Patient is progressing towards goals.  Pt responds to cueing for large amplitude movements, but demo incr difficulty with divided attention/conversation.  Pt would benefit from continued occupational therapy for education and reinforcement of large amplitude movement strategies to incr carryover.  PERFORMANCE DEFICITS: in functional skills including ADLs, IADLs, coordination, dexterity, ROM, strength, flexibility, Fine motor control, Gross motor control, mobility, balance, decreased knowledge of precautions, decreased knowledge of use of DME, and UE functional use, , and psychosocial skills including coping strategies, environmental adaptation, habits, interpersonal interactions, and routines and behaviors.   IMPAIRMENTS: are limiting patient from ADLs, IADLs, rest and sleep, play, leisure, and social participation.   COMORBIDITIES:  may have co-morbidities  that affects occupational performance. Patient will benefit from skilled OT to address above impairments and improve overall function.  MODIFICATION OR ASSISTANCE TO COMPLETE EVALUATION: No modification of tasks or assist necessary to complete an evaluation.  OT OCCUPATIONAL PROFILE AND HISTORY: Detailed assessment: Review of records and additional review of physical, cognitive, psychosocial history related to current functional performance.  CLINICAL DECISION MAKING: LOW - limited treatment options, no task  modification necessary  REHAB POTENTIAL: Good  EVALUATION COMPLEXITY: Low    PLAN:  OT FREQUENCY: 2x/week  OT DURATION: 12 weeks- anticipate d/c after 8 weeks  PLANNED INTERVENTIONS: 97168 OT Re-evaluation, 97535 self care/ADL training, 02889 therapeutic exercise, 97530 therapeutic activity, 97112 neuromuscular re-education, 97140 manual therapy, 97035 ultrasound, 97018 paraffin, 02989 moist heat, 97010 cryotherapy, 97760 Orthotic Initial, 97763 Orthotic/Prosthetic subsequent, passive range of motion, balance training, functional mobility training, energy conservation, coping strategies training, patient/family education, and DME and/or AE instructions  RECOMMENDED OTHER SERVICES: n/a  CONSULTED AND AGREED WITH PLAN OF CARE: Patient and family Adult nurse- wife Jim GORY FOR NEXT SESSION: Hx of multiple hip dislocations, exercise caution with LE movement, twisting, AE for feeding, check PPT#4 if time allows and set goal, review HEPs   Dorleen Kissel, OTR/L  04/02/2024, 11:53 AM

## 2024-04-02 ENCOUNTER — Ambulatory Visit: Attending: Internal Medicine | Admitting: Occupational Therapy

## 2024-04-02 ENCOUNTER — Encounter: Payer: Self-pay | Admitting: Occupational Therapy

## 2024-04-02 DIAGNOSIS — R2689 Other abnormalities of gait and mobility: Secondary | ICD-10-CM | POA: Diagnosis present

## 2024-04-02 DIAGNOSIS — R2681 Unsteadiness on feet: Secondary | ICD-10-CM | POA: Insufficient documentation

## 2024-04-02 DIAGNOSIS — R293 Abnormal posture: Secondary | ICD-10-CM | POA: Diagnosis present

## 2024-04-02 DIAGNOSIS — R29818 Other symptoms and signs involving the nervous system: Secondary | ICD-10-CM | POA: Diagnosis present

## 2024-04-02 DIAGNOSIS — R278 Other lack of coordination: Secondary | ICD-10-CM | POA: Insufficient documentation

## 2024-04-02 DIAGNOSIS — M25621 Stiffness of right elbow, not elsewhere classified: Secondary | ICD-10-CM | POA: Diagnosis present

## 2024-04-02 DIAGNOSIS — M25622 Stiffness of left elbow, not elsewhere classified: Secondary | ICD-10-CM | POA: Insufficient documentation

## 2024-04-02 DIAGNOSIS — M6281 Muscle weakness (generalized): Secondary | ICD-10-CM | POA: Diagnosis present

## 2024-04-02 NOTE — Patient Instructions (Addendum)
   PWR! Hand Exercises  Then, start with elbows bent and hands closed:  PWR! Hands: Push hands out BIG. Elbows straight, wrists up, fingers open and spread apart BIG.   PWR! Step: Touch index finger to thumb while keeping other fingers straight. Flick fingers out BIG (thumb out/straighten fingers). Repeat with other fingers. (Step your thumb to each finger).   With arms stretched out in front of you (elbows straight), perform the following:  PWR! Rock:  Move wrists up and down [employer]! Twist: Twist palms up and down BIG  ** Make each movement big and deliberate so that you feel the movement.  Perform at least 10 repetitions 1x/day, but perform PWR! Hands throughout the day when you are having trouble using your hands (picking up/manipulating small objects, writing, eating, typing, sewing, buttoning, etc.).     Coordination Exercises  Perform the following exercises for 20 minutes 1 times per day. Perform with both hand(s). Perform using big movements.  Flipping Cards: Place deck of cards on the table. Flip cards over by opening your hand big to grasp and then turn your palm up big, opening hand fully to release. Deal cards: Hold 1/2 or whole deck in your hand. Use thumb to push card off top of deck with one big push. Rotate ball with fingertips: Pick up with fingers/thumb and move as much as you can with each turn/movement (clockwise and counter-clockwise). Rotate 2 golf balls in your hand: Both directions. Pick up coins and stack one at a time: Open hand big and pick up with big, intentional movements. Do not drag coin to the edge. (5-10 in a stack) Pick up 5-10 coins one at a time and hold in palm. Then, move coins from palm to fingertips one at time and place in coin bank/container. Practice writing: Slow down, write big, and focus on forming each letter.

## 2024-04-06 ENCOUNTER — Encounter: Payer: Self-pay | Admitting: Occupational Therapy

## 2024-04-06 ENCOUNTER — Ambulatory Visit: Admitting: Occupational Therapy

## 2024-04-06 DIAGNOSIS — R29818 Other symptoms and signs involving the nervous system: Secondary | ICD-10-CM | POA: Diagnosis not present

## 2024-04-06 DIAGNOSIS — M25622 Stiffness of left elbow, not elsewhere classified: Secondary | ICD-10-CM

## 2024-04-06 DIAGNOSIS — R2681 Unsteadiness on feet: Secondary | ICD-10-CM

## 2024-04-06 DIAGNOSIS — R278 Other lack of coordination: Secondary | ICD-10-CM

## 2024-04-06 DIAGNOSIS — M6281 Muscle weakness (generalized): Secondary | ICD-10-CM

## 2024-04-06 DIAGNOSIS — R293 Abnormal posture: Secondary | ICD-10-CM

## 2024-04-06 DIAGNOSIS — M25621 Stiffness of right elbow, not elsewhere classified: Secondary | ICD-10-CM

## 2024-04-06 NOTE — Therapy (Signed)
 OUTPATIENT OCCUPATIONAL THERAPY PARKINSON'S TREATMENT  Patient Name: Jim Carson MRN: [id number] DOB:10/20/1950, 73 y.o., male Today's Date: 04/06/2024  PCP: Abran Slough, MD REFERRING PROVIDER: Georgina Collar MD  END OF SESSION:  OT End of Session - 04/06/24 1244     Visit Number 4    Number of Visits 25    Authorization Type Medicare    Authorization Time Period 12 weeks, anticipate d/c after 8 weeks    Authorization - Visit Number 4    Progress Note Due on Visit 10    OT Start Time 1235    OT Stop Time 1315    OT Time Calculation (min) 40 min    Activity Tolerance Patient tolerated treatment well    Behavior During Therapy WFL for tasks assessed/performed             Past Medical History:  Diagnosis Date   Abnormal gait    due to parkinson's,  uses cane   Absolute anemia 03/13/2023   Allergic rhinitis    Allergy Spring 1998   Allergic to dustmits, dog hair and roaches.   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 glendia  @[facility]   Pneumonia    Sleep apnea    Vitamin B12 deficiency    Vitamin D  deficiency    Wears glasses    Past Surgical History:  Procedure Laterality Date   APPENDECTOMY  07/1964   APPENDECTOMY     CHOLECYSTECTOMY     JOINT REPLACEMENT  March 2019 and again on 09/10/2021   Left hip twice.  2nd time due to 3 dislocations   LAPAROSCOPIC CHOLECYSTECTOMY  01-21-2017   @[facility] , [place]   lower back surgery      POSTERIOR CERVICAL FUSION/FORAMINOTOMY      POSTERIOR FUSION CERVICAL SPINE  08-15-2011   @[facility] , [place]   w/ laminectomy and decompression-- C3 -- T1   POSTERIOR LAMINECTOMY / DECOMPRESSION LUMBAR SPINE  07-30-2013   @[facility] , [place]   bilateral T11 - 12,  L2 --5   REMOVAL LEFT T1 PARASPINOUS MASS  02-12-2016   @[facility] ,  [place]   desmoid fibromatosis   SHOULDER ARTHROSCOPY WITH ROTATOR CUFF REPAIR Left 12/15/2018   Procedure: SHOULDER ARTHROSCOPY, biceps tenodesis labored Debridement, submacromial decompression;  Surgeon: Gerome Lamar, MD;  Location: [facility];  Service: Orthopedics;  Laterality: Left;  interscalene block   SPINE SURGERY     TOTAL HIP ARTHROPLASTY Left 09/18/2017   @[facility] , [place]   TOTAL HIP REVISION Left 09/10/2021   Procedure: Left hip acetabular versus total hip arthroplasty revision, constrained liner;  Surgeon: Melodi Lerner, MD;  Location: [facility];  Service: Orthopedics;  Laterality: Left;   TOTAL HIP REVISION Left 10/01/2023   Procedure: Left hip constrained liner revision;  Surgeon: Melodi Lerner, MD;  Location: [facility];  Service: Orthopedics;  Laterality: Left;    Patient Active Problem List   Diagnosis Date  Noted   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   Thrombocytopenia 03/17/2023   Absolute anemia 03/13/2023   Family history of prostate cancer 03/13/2023   History of elevated PSA 03/13/2023   AV block, 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   Drug induced constipation 12/30/2022   Acquired hypothyroidism 12/30/2022   Primary osteoarthritis of left hip 05/18/2021   PD (Parkinson's disease) (HCC) 05/18/2021   History of gastroesophageal reflux (GERD) 05/18/2021   OSA (obstructive sleep apnea) 05/18/2021   Essential hypertension 05/18/2021    ONSET DATE: referral 12/09/23  REFERRING DIAG:  Diagnosis  G20.B2 (ICD-10-CM) - Parkinson's disease with dyskinesia, with fluctuations    THERAPY DIAG:  Other symptoms and signs involving the  nervous system  Other lack of coordination  Stiffness of left elbow, not elsewhere classified  Stiffness of right elbow, not elsewhere classified  Unsteadiness on feet  Abnormal posture  Muscle weakness (generalized)  Rationale for Evaluation and Treatment: Rehabilitation  SUBJECTIVE:   SUBJECTIVE STATEMENT: Pt reports he is going on a trip next week  Pt accompanied by: wife [name] by [name]  PERTINENT HISTORY: Pt reports he had a L hip replacement in 2019 and dislocated it 3 times. Re did it 2023, had a fall and jammed it and had to revise it 10/01/23. Was told not to go too heavy over 110 lbs on the leg press. Was told not to cross his legs  PMH: Parkinson's Disease, hypothyroidism, hx of multiple concussions, GERD, benign essential HTN, abnormal gait, L THA 2019, removal of T1 paraspinous mass 2017, T11-12 and L2-5 lumbar lami/decompression 2015, cervical fusion C3-T1 2013, hx of shoulder surgery June 2020, hip surgery revision 10/01/23 after multiple dislocations  PRECAUTIONS:follow hip precautions avoid bending greater than 90, avoid rotating hip/ foot inwards/ outwards, do not cross legs Other: hip revision, told not to cross legs  WEIGHT BEARING RESTRICTIONS: No  PAIN:  Are you having pain? Yes: NPRS scale: 3/10 Pain location: R wrist Pain description: aching Aggravating factors: wrist extension Relieving factors: rest  FALLS: Has patient fallen in last 6 months? Yes. Number of falls 3, Pt saw PT to address  LIVING ENVIRONMENT: Lives with: lives with their family and lives with their spouse Lives in: House/apartment  Has following equipment at home: Environmental consultant - 4 wheeled and shower chair  PLOF: Independent with basic ADLs  PATIENT GOALS: improve grip and coordination, wants to play guitar better  OBJECTIVE:  Note: Objective measures were completed at Evaluation unless otherwise noted.  HAND DOMINANCE: Left  ADLs:  Overall ADLs: increased time  required Transfers/ambulation related to ADLs: Eating: difficulty holding knife to cut food  Grooming: mod I UB Dressing:needs assist with buttons at times LB Dressing: min A at times with socks Toileting: mod  Bathing: mod I Tub Shower transfers: mod I, walk in shower with seat Equipment: Shower seat with back and Walk in shower Has an up walker at home IADLs:  Light housekeeping: Pt does laundry, and cleans up after meals  Meal Prep: wife prepares Community mobility: mod I with rollator Medication management: keeps up with meds Financial management: keeps up with finanaces Handwriting: 50% legible and Moderate micrographia  MOBILITY STATUS: Independent  POSTURE COMMENTS:  rounded shoulders, forward head, and flexed trunk    FUNCTIONAL OUTCOME MEASURES: Fastening/unfastening 3 buttons: 1 min 29 secs Physical performance test: PPT#2 (simulated eating) 14.34 with 1 drop & PPT#4 (donning/doffing jacket): NT  COORDINATION: 9 Hole Peg test: Right: 2 mins.06  sec multiple drops; Left: 43.83 sec Box and Blocks:  Right 40 blocks, Left 47blocks  UE ROM:  RUE shoulder flexion 120, elbow extension -20, LUE shoulder flexion 130, elbow extension -20, decreased bilateral grip/ composite flexion due to arthritis  COGNITION: Overall cognitive status: Within functional limits for tasks assessed  OBSERVATIONS: Bradykinesia                                                                                                                    TREATMENT DATE: 04/06/24- Therapist reviewed hip prec with pt. (Avoiding bending at hip> 90, avoiding twisting hip/ foot inward and outward and avoiding crossing legs) to minimize risk of dislocation. Pt verbalized understanding. Supine, towel under low back, roll under knees and 2 pillows under head(hx of neck fusion) PWR! up with min/ mod facilitation followed by cane exercises for chest press and shoulder flexion, min v.c and facillitation/ v.c for keeping  shoulders down and back, elbow extension followed by bilateral shoulder abduction, with cuesfor shoulder and scapular depression, retraction. Standing PWR! rock and step, with min A for balance, pt was noted to loose balance several times. Pt was instructed not to try these standing exercises at home. Seated PWR! hands basic 4, min-mod v.c and demonstration. Stacking and manipulating coins for increased fine motor coordiantion, mod difficulty.    04/02/24:  Pt instructed in  PWR! Hands (basic 4) and coordination HEP with focus on large amplitude movements and returned demo with min-mod cueing for positioning, to slow down, and large amplitude movements.    03/30/24- Supine, towel under low back, roll under knees and 2 pillows under head(hx of neck fusion) PWR! up with min/ mod facilitation followed by cane exercises for chest press and shoulder flexion, mod v.c and facillitation/ v.c for keeping shoulders down and back, elbow extension Seated PWR! up and PWR! rock 10 reps each, min v.c Seated at table PWR! hands basic 4, min-mod v.c Flipping and dealing playing cards with big movements, min-mod v.c and demonstration Reviewed safety for sit-stand from a chair, slowing down and avoiding twisting to protect hip.  03/25/24- eval    PATIENT EDUCATION: Education details: PWR! Hands, reviewed supine cane exercises Person educated: Patient Education method: Explanation, Demonstration, Tactile cues, Verbal cues, and Handouts Education comprehension: verbalized understanding, returned demonstration, verbal cues required, tactile cues required, and needs further education   HOME EXERCISE PROGRAM:  03/30/24-cane exercises in supine 04/02/24:  PWR! Hands, Coordination HEP with focus on large amplitude   GOALS: Goals reviewed with patient? Yes  SHORT TERM GOALS: Target date: 04/23/24   I with PD specific HEP  Goal status: ongoing  04/06/24- needs reinforcement  2.  I with adapted strategies/AE to  maximize safety and I with ADLs/IADLs.  Goal status: ongoing  3.   Pt will demonstrate improved fine motor coordination for ADLs as evidenced by decreasing 9 hole peg test score for RUE to 2 mins or less with out drops Baseline:  Goal status: INITIAL  4.   Pt  will demonstrate improved fine motor coordination for ADLs as evidenced by decreasing 9 hole peg test score for LUE by 3 secs Baseline: LUE 43.83 secs  Goal status: I ongoing 04/06/24  5.  Pt will demonstrate improved ease with feeding as evidenced by decreasing PPT#2 to 11.34 without drops Baseline:  Goal status: ongoing 04/06/24  6.  Pt will write a  sentence with 100% legibility and minimal decrease in letter size  Goal status:  ongoing 04/06/24  LONG TERM GOALS: Target date: 06/17/24  Pt will demonstrate improved ease with fastening buttons as evidenced by decreasing 3 button/ unbutton time to :1 min 20 secs or less Baseline: 1 min 29 secs Goal status: INITIAL  2.  Pt will demonstrate ability to retrieve a lightweight object at 125 shoulder flexion and -15 elbow extension with RUE Goal status: INITIAL  3. Pt will demonstrate ability to retrieve a lightweight object at 130 shoulder flexion and -15 elbow extension with LUE Goal status: INITIAL  4.  Pt will verbalize understanding of ways to prevent future PD related complications and PD community resources.  Goal status: INITIAL  5.   Improve PPT#4 to 22 secs or less for increased ease with dressing Baseline: 26.24 Goal status: INITIAL  6.  Pt will report dropping utensils. and  cups less frequently  Goal status: INITIAL ASSESSMENT:  CLINICAL IMPRESSION: Patient is progressing towards goals. He demonstrates improved perfromance of supine exercises with cane today. Pt can benefit from reinforcement of safety with mobility and protecting hip to avoid dislocation.  PERFORMANCE DEFICITS: in functional skills including ADLs, IADLs, coordination, dexterity, ROM, strength,  flexibility, Fine motor control, Gross motor control, mobility, balance, decreased knowledge of precautions, decreased knowledge of use of DME, and UE functional use, , and psychosocial skills including coping strategies, environmental adaptation, habits, interpersonal interactions, and routines and behaviors.   IMPAIRMENTS: are limiting patient from ADLs, IADLs, rest and sleep, play, leisure, and social participation.   COMORBIDITIES:  may have co-morbidities  that affects occupational performance. Patient will benefit from skilled OT to address above impairments and improve overall function.  MODIFICATION OR ASSISTANCE TO COMPLETE EVALUATION: No modification of tasks or assist necessary to complete an evaluation.  OT OCCUPATIONAL PROFILE AND HISTORY: Detailed assessment: Review of records and additional review of physical, cognitive, psychosocial history related to current functional performance.  CLINICAL DECISION MAKING: LOW - limited treatment options, no task modification necessary  REHAB POTENTIAL: Good  EVALUATION COMPLEXITY: Low    PLAN:  OT FREQUENCY: 2x/week  OT DURATION: 12 weeks- anticipate d/c after 8 weeks  PLANNED INTERVENTIONS: 97168 OT Re-evaluation, 97535 self care/ADL training, 02889 therapeutic exercise, 97530 therapeutic activity, 97112 neuromuscular re-education, 97140 manual therapy, 97035 ultrasound, 97018 paraffin, 02989 moist heat, 97010 cryotherapy, 97760 Orthotic Initial, 97763 Orthotic/Prosthetic subsequent, passive range of motion, balance training, functional mobility training, energy conservation, coping strategies training, patient/family education, and DME and/or AE instructions  RECOMMENDED OTHER SERVICES: n/a  CONSULTED AND AGREED WITH PLAN OF CARE: Patient and family Adult nurse- wife Rock GORY FOR NEXT SESSION: AE for feeing, ADL strategies, donn jacket seated for safety? safety with turns and sit to stand  Hx of multiple hip dislocations,  exercise caution with LE movement, twisting, no bending > 90*    Somnang Mahan, OTR/L  04/06/2024, 1:08 PM

## 2024-04-08 NOTE — Therapy (Signed)
 OUTPATIENT OCCUPATIONAL THERAPY PARKINSON'S TREATMENT  Patient Name: Jim Carson MRN: [id number] DOB:[birth year], 73 y.o., male Today's Date: 04/08/2024  PCP: Abran Slough, MD REFERRING PROVIDER: Georgina Collar MD  END OF SESSION:       Past Medical History:  Diagnosis Date   Abnormal gait    due to parkinson's,  uses cane   Absolute anemia 03/13/2023   Allergic rhinitis    Allergy Spring 1998   Allergic to dustmits, dog hair and roaches.   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 glendia  @[facility]   Pneumonia    Sleep apnea    Vitamin B12 deficiency    Vitamin D  deficiency    Wears glasses    Past Surgical History:  Procedure Laterality Date   APPENDECTOMY  07/1964   APPENDECTOMY     CHOLECYSTECTOMY     JOINT REPLACEMENT  March 2019 and again on 09/10/2021   Left hip twice.  2nd time due to 3 dislocations   LAPAROSCOPIC CHOLECYSTECTOMY  01-21-2017   @[facility] , [place]   lower back surgery      POSTERIOR CERVICAL FUSION/FORAMINOTOMY     POSTERIOR FUSION CERVICAL SPINE  08-15-2011   @[facility] , [place]   w/ laminectomy and decompression-- C3 -- T1   POSTERIOR LAMINECTOMY / DECOMPRESSION LUMBAR SPINE  07-30-2013   @[facility] , [place]   bilateral T11 - 12,  L2 --5   REMOVAL LEFT T1 PARASPINOUS MASS  02-12-2016   @[facility] ,  [place]   desmoid fibromatosis   SHOULDER ARTHROSCOPY WITH ROTATOR CUFF REPAIR Left 12/15/2018   Procedure: SHOULDER ARTHROSCOPY, biceps tenodesis labored Debridement, submacromial  decompression;  Surgeon: Gerome Lamar, MD;  Location[facility];  Service: Orthopedics;  Laterality: Left;  interscalene block   SPINE SURGERY     TOTAL HIP ARTHROPLASTY Left 09/18/2017   @[facility] , [place]   TOTAL HIP REVISION Left 09/10/2021   Procedure: Left hip acetabular versus total hip arthroplasty revision, constrained liner;  Surgeon: Melodi Lerner, MD;  Location: [facility];  Service: Orthopedics;  Laterality: Left;   TOTAL HIP REVISION Left 10/01/2023   Procedure: Left hip constrained liner revision;  Surgeon: Melodi Lerner, MD;  Location: [facility];  Service: Orthopedics;  Laterality: Left;    Patient Active Problem List   Diagnosis Date Noted   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   Thrombocytopenia 03/17/2023   Absolute anemia 03/13/2023   Family history of prostate cancer 03/13/2023   History of elevated PSA 03/13/2023   AV block, 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  Rationale for Evaluation and Treatment: Rehabilitation  SUBJECTIVE:   SUBJECTIVE STATEMENT: Pt reports he is going on a trip next week  Pt accompanied by: wife Jim Carson by [name]  PERTINENT HISTORY: Pt reports he had a L hip replacement in 2019 and dislocated it 3 times. Re did it 2023, had a fall and jammed it and had to revise it 10/01/23. Was told not to go too heavy over 110 lbs on the leg press. Was told not to cross his legs  PMH: Parkinson's Disease,  hypothyroidism, hx of multiple concussions, GERD, benign essential HTN, abnormal gait, L THA 2019, removal of T1 paraspinous mass 2017, T11-12 and L2-5 lumbar lami/decompression 2015, cervical fusion C3-T1 2013, hx of shoulder surgery June 2020, hip surgery revision 10/01/23 after multiple dislocations  PRECAUTIONS:follow hip precautions avoid bending greater than 90, avoid rotating hip/ foot inwards/ outwards, do not cross legs Other: hip revision, told not to cross legs  WEIGHT BEARING RESTRICTIONS: No  PAIN:  Are you having pain? Yes: NPRS scale: 3/10 Pain location: R wrist Pain description: aching Aggravating factors: wrist extension Relieving factors: rest  FALLS: Has patient fallen in last 6 months? Yes. Number of falls 3, Pt saw PT to address  LIVING ENVIRONMENT: Lives with: lives with their family and lives with their spouse Lives in: House/apartment  Has following equipment at home: Environmental consultant - 4 wheeled and shower chair  PLOF: Independent with basic ADLs  PATIENT GOALS: improve grip and coordination, wants to play guitar better  OBJECTIVE:  Note: Objective measures were completed at Evaluation unless otherwise noted.  HAND DOMINANCE: Left  ADLs:  Overall ADLs: increased time required Transfers/ambulation related to ADLs: Eating: difficulty holding knife to cut food  Grooming: mod I UB Dressing:needs assist with buttons at times LB Dressing: min A at times with socks Toileting: mod  Bathing: mod I Tub Shower transfers: mod I, walk in shower with seat Equipment: Shower seat with back and Walk in shower Has an up walker at home IADLs:  Light housekeeping: Pt does laundry, and cleans up after meals  Meal Prep: wife prepares Community mobility: mod I with rollator Medication management: keeps up with meds Financial management: keeps up with finanaces Handwriting: 50% legible and Moderate micrographia  MOBILITY STATUS: Independent  POSTURE COMMENTS:  rounded  shoulders, forward head, and flexed trunk    FUNCTIONAL OUTCOME MEASURES: Fastening/unfastening 3 buttons: 1 min 29 secs Physical performance test: PPT#2 (simulated eating) 14.34 with 1 drop & PPT#4 (donning/doffing jacket): NT  COORDINATION: 9 Hole Peg test: Right: 2 mins.06 sec multiple drops; Left: 43.83 sec Box and Blocks:  Right 40 blocks, Left 47blocks  UE ROM:  RUE shoulder flexion 120, elbow extension -20, LUE shoulder flexion 130, elbow extension -20, decreased bilateral grip/ composite flexion due to arthritis  COGNITION: Overall cognitive status: Within functional limits for tasks assessed  OBSERVATIONS: Bradykinesia  TREATMENT DATE:   ***:  ***  04/06/24- Therapist reviewed hip prec with pt. (Avoiding bending at hip> 90, avoiding twisting hip/ foot inward and outward and avoiding crossing legs) to minimize risk of dislocation. Pt verbalized understanding. Supine, towel under low back, roll under knees and 2 pillows under head(hx of neck fusion) PWR! up with min/ mod facilitation followed by cane exercises for chest press and shoulder flexion, min v.c and facillitation/ v.c for keeping shoulders down and back, elbow extension followed by bilateral shoulder abduction, with cuesfor shoulder and scapular depression, retraction. Standing PWR! Jim and step, with min A for balance, pt was noted to loose balance several times. Pt was instructed not to try these standing exercises at home. Seated PWR! hands basic 4, min-mod v.c and demonstration. Stacking and manipulating coins for increased fine motor coordiantion, mod difficulty   04/02/24:  Pt instructed in  PWR! Hands (basic 4) and coordination HEP with focus on large amplitude movements and returned demo with min-mod cueing for positioning, to slow down, and large amplitude movements.    03/30/24- Supine, towel under  low back, roll under knees and 2 pillows under head(hx of neck fusion) PWR! up with min/ mod facilitation followed by cane exercises for chest press and shoulder flexion, mod v.c and facillitation/ v.c for keeping shoulders down and back, elbow extension Seated PWR! up and PWR! Jim 10 reps each, min v.c Seated at table PWR! hands basic 4, min-mod v.c Flipping and dealing playing cards with big movements, min-mod v.c and demonstration Reviewed safety for sit-stand from a chair, slowing down and avoiding twisting to protect hip.  03/25/24- eval    PATIENT EDUCATION: Education details: *** PWR! Hands, reviewed supine cane exercises Person educated: Patient Education method: Explanation, Demonstration, Tactile cues, Verbal cues, and Handouts Education comprehension: verbalized understanding, returned demonstration, verbal cues required, tactile cues required, and needs further education   HOME EXERCISE PROGRAM:  03/30/24-cane exercises in supine 04/02/24:  PWR! Hands, Coordination HEP with focus on large amplitude   GOALS: Goals reviewed with patient? Yes  SHORT TERM GOALS: Target date: 04/23/24   I with PD specific HEP Goal status: ongoing  04/06/24- needs reinforcement  2.  I with adapted strategies/AE to maximize safety and I with ADLs/IADLs. Goal status: ongoing  3.   Pt will demonstrate improved fine motor coordination for ADLs as evidenced by decreasing 9 hole peg test score for RUE to 2 mins or less with out drops Goal status: INITIAL  4.   Pt will demonstrate improved fine motor coordination for ADLs as evidenced by decreasing 9 hole peg test score for LUE by 3 secs Baseline: LUE 43.83 secs Goal status: I ongoing 04/06/24  5.  Pt will demonstrate improved ease with feeding as evidenced by decreasing PPT#2 to 11.34 without drops Baseline:  Goal status: ongoing 04/06/24  6.  Pt will write a  sentence with 100% legibility and minimal decrease in letter size Goal status:  ongoing  04/06/24  LONG TERM GOALS: Target date: 06/17/24  Pt will demonstrate improved ease with fastening buttons as evidenced by decreasing 3 button/ unbutton time to :1 min 20 secs or less Baseline: 1 min 29 secs Goal status: INITIAL  2.  Pt will demonstrate ability to retrieve a lightweight object at 125 shoulder flexion and -15 elbow extension with RUE Goal status: INITIAL  3. Pt will demonstrate ability to retrieve a lightweight object at 130 shoulder flexion and -15 elbow extension with LUE Goal status: INITIAL  4.  Pt will verbalize understanding of ways to prevent future PD related complications and PD community resources. Goal status: INITIAL  5.   Improve PPT#4 to 22 secs or less for increased ease with dressing Baseline: 26.24 Goal status: INITIAL  6.  Pt will report dropping utensils. and  cups less frequently Goal status: INITIAL   ASSESSMENT:  CLINICAL IMPRESSION: *** Patient is progressing towards goals. He demonstrates improved perfromance of supine exercises with cane today. Pt can benefit from reinforcement of safety with mobility and protecting hip to avoid dislocation.    PERFORMANCE DEFICITS: in functional skills including ADLs, IADLs, coordination, dexterity, ROM, strength, flexibility, Fine motor control, Gross motor control, mobility, balance, decreased knowledge of precautions, decreased knowledge of use of DME, and UE functional use, , and psychosocial skills including coping strategies, environmental adaptation, habits, interpersonal interactions, and routines and behaviors.   IMPAIRMENTS: are limiting patient from ADLs, IADLs, rest and sleep, play, leisure, and social participation.   COMORBIDITIES:  may have co-morbidities  that affects occupational performance. Patient will benefit from skilled OT to address above impairments and improve overall function.  MODIFICATION OR ASSISTANCE TO COMPLETE EVALUATION: No modification of tasks or assist necessary to  complete an evaluation.  OT OCCUPATIONAL PROFILE AND HISTORY: Detailed assessment: Review of records and additional review of physical, cognitive, psychosocial history related to current functional performance.  CLINICAL DECISION MAKING: LOW - limited treatment options, no task modification necessary  REHAB POTENTIAL: Good  EVALUATION COMPLEXITY: Low    PLAN:  OT FREQUENCY: 2x/week  OT DURATION: 12 weeks- anticipate d/c after 8 weeks  PLANNED INTERVENTIONS: 97168 OT Re-evaluation, 97535 self care/ADL training, 02889 therapeutic exercise, 97530 therapeutic activity, 97112 neuromuscular re-education, 97140 manual therapy, 97035 ultrasound, 97018 paraffin, 02989 moist heat, 97010 cryotherapy, 97760 Orthotic Initial, 97763 Orthotic/Prosthetic subsequent, passive range of motion, balance training, functional mobility training, energy conservation, coping strategies training, patient/family education, and DME and/or AE instructions  RECOMMENDED OTHER SERVICES: n/a  CONSULTED AND AGREED WITH PLAN OF CARE: Patient and family Adult nurse- wife Jim GORY FOR NEXT SESSION: *** AE for feeing, ADL strategies, donn jacket seated for safety? safety with turns and sit to stand  Hx of multiple hip dislocations, exercise caution with LE movement, twisting, no bending > 90*    Shelda Truby, OTR/L  04/08/2024, 9:39 PM

## 2024-04-09 ENCOUNTER — Ambulatory Visit: Admitting: Occupational Therapy

## 2024-04-09 ENCOUNTER — Encounter: Payer: Self-pay | Admitting: Occupational Therapy

## 2024-04-09 DIAGNOSIS — R29818 Other symptoms and signs involving the nervous system: Secondary | ICD-10-CM

## 2024-04-09 DIAGNOSIS — M25622 Stiffness of left elbow, not elsewhere classified: Secondary | ICD-10-CM

## 2024-04-09 DIAGNOSIS — R293 Abnormal posture: Secondary | ICD-10-CM

## 2024-04-09 DIAGNOSIS — R2681 Unsteadiness on feet: Secondary | ICD-10-CM

## 2024-04-09 DIAGNOSIS — R278 Other lack of coordination: Secondary | ICD-10-CM

## 2024-04-09 DIAGNOSIS — M25621 Stiffness of right elbow, not elsewhere classified: Secondary | ICD-10-CM

## 2024-04-09 NOTE — Patient Instructions (Addendum)
    Eating strategies:  Try eating out of a bowl or use a plate guard (available online or from a medical supply store) Cut a sandwich in 4 pieces Have the restaurant cut meat in advance or ask for a steak knife and cut with long strokes/movements Use a spoon instead of a fork to scoop food (like mashed potatoes, peas, corn, green beans, etc).   For salad, make sure that the fork is straight up and down to stab lettuce Always hold spoon/fork in the middle and not the end of the handle.  When eating soup/cereal, use a tablespoon, but don't fill the spoon Turn your wrist so that the tip of your spoon/fork is facing you before you lift utensil to reduce spills To cut, put tip of knife IN FRONT OF FOOD and cut with long slices instead of short sawing motion.

## 2024-04-26 ENCOUNTER — Ambulatory Visit: Admitting: Occupational Therapy

## 2024-04-26 ENCOUNTER — Encounter: Payer: Self-pay | Admitting: Occupational Therapy

## 2024-04-26 DIAGNOSIS — R278 Other lack of coordination: Secondary | ICD-10-CM

## 2024-04-26 DIAGNOSIS — R293 Abnormal posture: Secondary | ICD-10-CM

## 2024-04-26 DIAGNOSIS — R2689 Other abnormalities of gait and mobility: Secondary | ICD-10-CM

## 2024-04-26 DIAGNOSIS — R29818 Other symptoms and signs involving the nervous system: Secondary | ICD-10-CM

## 2024-04-26 DIAGNOSIS — M6281 Muscle weakness (generalized): Secondary | ICD-10-CM

## 2024-04-26 DIAGNOSIS — M25622 Stiffness of left elbow, not elsewhere classified: Secondary | ICD-10-CM

## 2024-04-26 DIAGNOSIS — M25621 Stiffness of right elbow, not elsewhere classified: Secondary | ICD-10-CM

## 2024-04-26 DIAGNOSIS — R2681 Unsteadiness on feet: Secondary | ICD-10-CM

## 2024-04-26 NOTE — Therapy (Signed)
 OUTPATIENT OCCUPATIONAL THERAPY PARKINSON'S TREATMENT  Patient Name: Jim Carson MRN: [id number] DOB:Apr 20, 1951, 73 y.o., male Today's Date: 04/26/2024  PCP: Abran Slough, MD REFERRING PROVIDER: Georgina Collar MD  END OF SESSION:  OT End of Session - 04/26/24 1242     Visit Number 6    Number of Visits 25    Date for Recertification  06/17/24    Authorization Type Medicare    Authorization Time Period 12 weeks, anticipate d/c after 8 weeks    Authorization - Visit Number 6    Progress Note Due on Visit 10    OT Start Time 1237    OT Stop Time 1315    OT Time Calculation (min) 38 min    Activity Tolerance Patient tolerated treatment well    Behavior During Therapy [facility] for tasks assessed/performed              Past Medical History:  Diagnosis Date   Abnormal gait    due to parkinson's,  uses cane   Absolute anemia 03/13/2023   Allergic rhinitis    Allergy Spring 1998   Allergic to dustmits, dog hair and roaches.   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 glendia  @[facility]   Pneumonia    Sleep apnea    Vitamin B12 deficiency    Vitamin D  deficiency    Wears glasses    Past Surgical History:  Procedure Laterality Date   APPENDECTOMY  07/1964   APPENDECTOMY     CHOLECYSTECTOMY     JOINT REPLACEMENT  March 2019 and again on 09/10/2021   Left hip twice.  2nd time due to 3 dislocations   LAPAROSCOPIC CHOLECYSTECTOMY  01-21-2017   @[facility] , [place]   lower back surgery       POSTERIOR CERVICAL FUSION/FORAMINOTOMY     POSTERIOR FUSION CERVICAL SPINE  08-15-2011   @[facility] , [place]   w/ laminectomy and decompression-- C3 -- T1   POSTERIOR LAMINECTOMY / DECOMPRESSION LUMBAR SPINE  07-30-2013   @[facility] , [place]   bilateral T11 - 12,  L2 --5   REMOVAL LEFT T1 PARASPINOUS MASS  02-12-2016   @[facility] ,  [place]   desmoid fibromatosis   SHOULDER ARTHROSCOPY WITH ROTATOR CUFF REPAIR Left 12/15/2018   Procedure: SHOULDER ARTHROSCOPY, biceps tenodesis labored Debridement, submacromial decompression;  Surgeon: Gerome Lamar, MD;  Location: [facility];  Service: Orthopedics;  Laterality: Left;  interscalene block   SPINE SURGERY     TOTAL HIP ARTHROPLASTY Left 09/18/2017   @[facility] , [place]   TOTAL HIP REVISION Left 09/10/2021   Procedure: Left hip acetabular versus total hip arthroplasty revision, constrained liner;  Surgeon: Melodi Lerner, MD;  Location: [facility];  Service: Orthopedics;  Laterality: Left;   TOTAL HIP REVISION Left 10/01/2023   Procedure: Left hip constrained liner revision;  Surgeon: Melodi Lerner, MD;  Location: [facility];  Service: Orthopedics;  Laterality: Left;   Patient Active Problem List   Diagnosis Date Noted   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   Thrombocytopenia 03/17/2023   Absolute anemia 03/13/2023   Family history of prostate cancer 03/13/2023   History of elevated PSA 03/13/2023   AV block, 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   Drug induced constipation 12/30/2022   Acquired hypothyroidism 12/30/2022   Primary osteoarthritis of left hip 05/18/2021   PD (Parkinson's disease) (HCC) 05/18/2021   History of gastroesophageal reflux (GERD) 05/18/2021   OSA (obstructive sleep apnea) 05/18/2021   Essential hypertension 05/18/2021    ONSET DATE: referral 12/09/23  REFERRING DIAG:  Diagnosis  G20.B2 (ICD-10-CM) - Parkinson's disease with dyskinesia, with fluctuations    THERAPY  DIAG:  Other symptoms and signs involving the nervous system  Other lack of coordination  Stiffness of left elbow, not elsewhere classified  Stiffness of right elbow, not elsewhere classified  Unsteadiness on feet  Abnormal posture  Muscle weakness (generalized)  Other abnormalities of gait and mobility  Rationale for Evaluation and Treatment: Rehabilitation  SUBJECTIVE:   SUBJECTIVE STATEMENT: Pt reports he had a good trip  Pt accompanied by: wife Jim Carson by [name]  PERTINENT HISTORY: Pt reports he had a L hip replacement in 2019 and dislocated it 3 times. Re did it 2023, had a fall and jammed it and had to revise it 10/01/23. Was told not to go too heavy over 110 lbs on the leg press. Was told not to cross his legs  PMH: Parkinson's Disease, hypothyroidism, hx of multiple concussions, GERD, benign essential HTN, abnormal gait, L THA 2019, removal of T1 paraspinous mass 2017, T11-12 and L2-5 lumbar lami/decompression 2015, cervical fusion C3-T1 2013, hx of shoulder surgery June 2020, hip surgery revision 10/01/23 after multiple dislocations  PRECAUTIONS:follow hip precautions avoid bending greater than 90, avoid rotating hip/ foot inwards/ outwards, do not cross legs Other: hip revision, told not to cross legs  WEIGHT BEARING RESTRICTIONS: No  PAIN:  Are you having pain? Yes: NPRS scale: 3/10 Pain location: R wrist Pain description: aching Aggravating factors: wrist extension Relieving factors: rest  FALLS: Has patient fallen in last 6 months? Yes. Number of falls 3, Pt saw PT to address  LIVING ENVIRONMENT: Lives with: lives with their family and lives with their spouse Lives in: House/apartment  Has following equipment at home: Environmental Consultant - 4 wheeled and shower chair  PLOF: Independent with basic ADLs  PATIENT GOALS: improve grip and coordination, wants to play guitar better  OBJECTIVE:  Note: Objective measures were completed at Evaluation unless otherwise  noted.  HAND DOMINANCE: Left  ADLs:  Overall ADLs: increased time required Transfers/ambulation related to ADLs: Eating: difficulty holding knife to cut food  Grooming: mod I UB Dressing:needs assist with buttons at times LB Dressing: min A at times with socks Toileting: mod  Bathing: mod I Tub Shower transfers: mod I, walk in shower with seat Equipment: Shower seat with back and Walk in shower Has an up walker at home IADLs:  Light housekeeping: Pt does laundry, and cleans up after meals  Meal Prep: wife prepares Community mobility: mod I with rollator Medication management: keeps up with meds Financial management: keeps up with finanaces Handwriting: 50% legible and Moderate micrographia  MOBILITY STATUS: Independent  POSTURE COMMENTS:  rounded shoulders, forward head, and flexed trunk    FUNCTIONAL OUTCOME MEASURES: Fastening/unfastening 3 buttons: 1 min 29 secs Physical performance test: PPT#2 (simulated eating) 14.34 with 1 drop & PPT#4 (  donning/doffing jacket): NT  COORDINATION: 9 Hole Peg test: Right: 2 mins.06 sec multiple drops; Left: 43.83 sec Box and Blocks:  Right 40 blocks, Left 47blocks  UE ROM:  RUE shoulder flexion 120, elbow extension -20, LUE shoulder flexion 130, elbow extension -20, decreased bilateral grip/ composite flexion due to arthritis  COGNITION: Overall cognitive status: Within functional limits for tasks assessed  OBSERVATIONS: Bradykinesia                                                                                                                    TREATMENT DATE:04/26/24 Supine, roll under knees and 2 pillows under head(hx of neck fusion) PWR! up with min/ mod facilitation followed by cane exercises for chest press and shoulder flexion, and shoulder abdcution for bilateral UE's min v.c and facillitation/ v.c for keeping shoulders down and back,  Seated pt practiced donning doffing jacket in seated x 2 with min-mod v.c for larger  amplitude movements. PWR! hands basic 4 x 10 reps each min v.c Simulated feeding with foam grip on utensil, min v.c to slow down and rest spoon between bites.  Fastening/ unfastening buttons with adapted strategeis, mod v.c for techniques, increased time and demonstration required.   04/09/24:  Pt instructed in eating strategies and simulated/practiced as able with min cueing.  Pt also instructed in writing strategies and practiced with min cueing.  Pt wrote 2 short paragraphs (self generated) in cursive with approx 95% legibility and min decr in size.  Pt demo improvement with use of frequent stretching/PWR! Hands.  04/06/24- Therapist reviewed hip prec with pt. (Avoiding bending at hip> 90, avoiding twisting hip/ foot inward and outward and avoiding crossing legs) to minimize risk of dislocation. Pt verbalized understanding. Supine, towel under low back, roll under knees and 2 pillows under head(hx of neck fusion) PWR! up with min/ mod facilitation followed by cane exercises for chest press and shoulder flexion, min v.c and facillitation/ v.c for keeping shoulders down and back, elbow extension followed by bilateral shoulder abduction, with cues  for shoulder and scapular depression, retraction. Standing PWR! Jim and step, with min A for balance, pt was noted to lose balance several times. Pt was instructed not to try these standing exercises at home. Seated PWR! hands basic 4, min-mod v.c and demonstration. Stacking and manipulating coins for increased fine motor coordiantion, mod difficulty   04/02/24:  Pt instructed in  PWR! Hands (basic 4) and coordination HEP with focus on large amplitude movements and returned demo with min-mod cueing for positioning, to slow down, and large amplitude movements.    03/30/24- Supine, towel under low back, roll under knees and 2 pillows under head(hx of neck fusion) PWR! up with min/ mod facilitation followed by cane exercises for chest press and shoulder  flexion, mod v.c and facillitation/ v.c for keeping shoulders down and back, elbow extension Seated PWR! up and PWR! Jim 10 reps each, min v.c Seated at table PWR! hands basic 4, min-mod v.c Flipping and dealing playing cards with  big movements, min-mod v.c and demonstration Reviewed safety for sit-stand from a chair, slowing down and avoiding twisting to protect hip.  03/25/24- eval    PATIENT EDUCATION: Education details: Eating strategies, use of foam grips Person educated: Patient Education method: Explanation, Demonstration, Tactile cues, Verbal cues,  Education comprehension: verbalized understanding, returned demonstration, verbal cues required, and needs further education   HOME EXERCISE PROGRAM:  03/30/24-cane exercises in supine 04/02/24:  PWR! Hands, Coordination HEP with focus on large amplitude  04/09/24:  Strategies for eating and writing  GOALS: Goals reviewed with patient? Yes  SHORT TERM GOALS: Target date: 04/23/24   I with PD specific HEP Goal status: ongoing  04/06/24- needs reinforcement  2.  I with adapted strategies/AE to maximize safety and I with ADLs/IADLs. Goal status: ongoing  04/09/24--needs reinforcement  3.   Pt will demonstrate improved fine motor coordination for ADLs as evidenced by decreasing 9 hole peg test score for RUE to 2 mins or less with out drops Goal status: ongoing 04/09/24  4.   Pt will demonstrate improved fine motor coordination for ADLs as evidenced by decreasing 9 hole peg test score for LUE by 3 secs Baseline: LUE 43.83 secs Goal status:  ongoing 04/06/24  5.  Pt will demonstrate improved ease with feeding as evidenced by decreasing PPT#2 to 11.34 without drops Baseline:  Goal status: ongoing 04/06/24, 04/09/24 ongoing  6.  Pt will write a  sentence with 100% legibility and minimal decrease in letter size Goal status:  ongoing 04/06/24, 04/09/24--ongoing, approx 95% with min decr in size   LONG TERM GOALS: Target date:  06/17/24  Pt will demonstrate improved ease with fastening buttons as evidenced by decreasing 3 button/ unbutton time to :1 min 20 secs or less Baseline: 1 min 29 secs Goal status: ongoing  2.  Pt will demonstrate ability to retrieve a lightweight object at 125 shoulder flexion and -15 elbow extension with RUE Goal status: INITIAL  3. Pt will demonstrate ability to retrieve a lightweight object at 130 shoulder flexion and -15 elbow extension with LUE Goal status: INITIAL  4.  Pt will verbalize understanding of ways to prevent future PD related complications and PD community resources. Goal status: INITIAL  5.   Improve PPT#4 to 22 secs or less for increased ease with dressing Baseline: 26.24 Goal status: ongoing  6.  Pt will report dropping utensils. and  cups less frequently Goal status: INITIAL   ASSESSMENT:  CLINICAL IMPRESSION: Pt is progressing towards goals. He demonstrates improved self feeding with foam grips and he demonstrates improved fastening buttons following education. PERFORMANCE DEFICITS: in functional skills including ADLs, IADLs, coordination, dexterity, ROM, strength, flexibility, Fine motor control, Gross motor control, mobility, balance, decreased knowledge of precautions, decreased knowledge of use of DME, and UE functional use, , and psychosocial skills including coping strategies, environmental adaptation, habits, interpersonal interactions, and routines and behaviors.   IMPAIRMENTS: are limiting patient from ADLs, IADLs, rest and sleep, play, leisure, and social participation.   COMORBIDITIES:  may have co-morbidities  that affects occupational performance. Patient will benefit from skilled OT to address above impairments and improve overall function.  MODIFICATION OR ASSISTANCE TO COMPLETE EVALUATION: No modification of tasks or assist necessary to complete an evaluation.  OT OCCUPATIONAL PROFILE AND HISTORY: Detailed assessment: Review of records and  additional review of physical, cognitive, psychosocial history related to current functional performance.  CLINICAL DECISION MAKING: LOW - limited treatment options, no task modification necessary  REHAB POTENTIAL: Good  EVALUATION COMPLEXITY: Low   PLAN:  OT FREQUENCY: 2x/week  OT DURATION: 12 weeks- anticipate d/c after 8 weeks  PLANNED INTERVENTIONS: 97168 OT Re-evaluation, 97535 self care/ADL training, 02889 therapeutic exercise, 97530 therapeutic activity, 97112 neuromuscular re-education, 97140 manual therapy, 97035 ultrasound, 97018 paraffin, 02989 moist heat, 97010 cryotherapy, 97760 Orthotic Initial, 97763 Orthotic/Prosthetic subsequent, passive range of motion, balance training, functional mobility training, energy conservation, coping strategies training, patient/family education, and DME and/or AE instructions  RECOMMENDED OTHER SERVICES: n/a  CONSULTED AND AGREED WITH PLAN OF CARE: Patient and family adult nurse- wife Jim Carson FOR NEXT SESSION:  continue to work towards unmet short term goals  Hx of multiple hip dislocations, exercise caution with LE movement, twisting, no bending > 90*    Donyale Berthold, OTR/L  04/26/2024, 12:44 PM

## 2024-04-29 ENCOUNTER — Encounter: Payer: Self-pay | Admitting: Occupational Therapy

## 2024-04-29 ENCOUNTER — Ambulatory Visit: Admitting: Occupational Therapy

## 2024-04-29 DIAGNOSIS — M6281 Muscle weakness (generalized): Secondary | ICD-10-CM

## 2024-04-29 DIAGNOSIS — M25622 Stiffness of left elbow, not elsewhere classified: Secondary | ICD-10-CM

## 2024-04-29 DIAGNOSIS — R2689 Other abnormalities of gait and mobility: Secondary | ICD-10-CM

## 2024-04-29 DIAGNOSIS — R293 Abnormal posture: Secondary | ICD-10-CM

## 2024-04-29 DIAGNOSIS — R29818 Other symptoms and signs involving the nervous system: Secondary | ICD-10-CM

## 2024-04-29 DIAGNOSIS — R2681 Unsteadiness on feet: Secondary | ICD-10-CM

## 2024-04-29 DIAGNOSIS — M25621 Stiffness of right elbow, not elsewhere classified: Secondary | ICD-10-CM

## 2024-04-29 DIAGNOSIS — R278 Other lack of coordination: Secondary | ICD-10-CM

## 2024-04-29 NOTE — Therapy (Addendum)
 OUTPATIENT OCCUPATIONAL THERAPY PARKINSON'S TREATMENT  Patient Name: Jim Carson MRN: [id number] DOB:October 11, 1950, 73 y.o., male Today's Date: 04/29/2024  PCP: Abran Slough, MD REFERRING PROVIDER: Georgina Collar MD  END OF SESSION:  OT End of Session - 04/29/24 1347     Visit Number 7    Number of Visits 25    Date for Recertification  06/17/24    Authorization Type Medicare    Authorization Time Period 12 weeks, anticipate d/c after 8 weeks    Authorization - Visit Number 7    Progress Note Due on Visit 10    OT Start Time 1235    OT Stop Time 1315    OT Time Calculation (min) 40 min               Past Medical History:  Diagnosis Date   Abnormal gait    due to parkinson's,  uses cane   Absolute anemia 03/13/2023   Allergic rhinitis    Allergy Spring 1998   Allergic to dustmits, dog hair and roaches.   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 glendia  @[facility]   Pneumonia    Sleep apnea    Vitamin B12 deficiency    Vitamin D  deficiency    Wears glasses    Past Surgical History:  Procedure Laterality Date   APPENDECTOMY  07/1964   APPENDECTOMY     CHOLECYSTECTOMY     JOINT REPLACEMENT  March 2019 and again on 09/10/2021   Left hip twice.  2nd time due to 3 dislocations   LAPAROSCOPIC CHOLECYSTECTOMY  01-21-2017   @[facility] , [place]   lower back surgery      POSTERIOR CERVICAL FUSION/FORAMINOTOMY     POSTERIOR FUSION CERVICAL SPINE  08-15-2011   @[facility] , [place]   w/ laminectomy  and decompression-- C3 -- T1   POSTERIOR LAMINECTOMY / DECOMPRESSION LUMBAR SPINE  07-30-2013   @[facility] , [place]   bilateral T11 - 12,  L2 --5   REMOVAL LEFT T1 PARASPINOUS MASS  02-12-2016   @[facility] ,  [place]   desmoid fibromatosis   SHOULDER ARTHROSCOPY WITH ROTATOR CUFF REPAIR Left 12/15/2018   Procedure: SHOULDER ARTHROSCOPY, biceps tenodesis labored Debridement, submacromial decompression;  Surgeon: Gerome Lamar, MD;  Location: [facility];  Service: Orthopedics;  Laterality: Left;  interscalene block   SPINE SURGERY     TOTAL HIP ARTHROPLASTY Left 09/18/2017   @[facility] , [place]   TOTAL HIP REVISION Left 09/10/2021   Procedure: Left hip acetabular versus total hip arthroplasty revision, constrained liner;  Surgeon: Melodi Lerner, MD;  Location: [facility];  Service: Orthopedics;  Laterality: Left;   TOTAL HIP REVISION Left 10/01/2023   Procedure: Left hip constrained liner revision;  Surgeon: Melodi Lerner, MD;  Location: [facility];  Service: Orthopedics;  Laterality: Left;    Patient Active Problem List   Diagnosis Date Noted   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  Thrombocytopenia 03/17/2023   Absolute anemia 03/13/2023   Family history of prostate cancer 03/13/2023   History of elevated PSA 03/13/2023   AV block, 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   Drug induced constipation 12/30/2022   Acquired hypothyroidism 12/30/2022   Primary osteoarthritis of left hip 05/18/2021   PD (Parkinson's disease) (HCC) 05/18/2021   History of gastroesophageal reflux (GERD) 05/18/2021   OSA (obstructive sleep apnea) 05/18/2021   Essential hypertension 05/18/2021    ONSET DATE: referral 12/09/23  REFERRING DIAG:  Diagnosis  G20.B2 (ICD-10-CM) - Parkinson's disease with dyskinesia, with fluctuations    THERAPY DIAG:  Other symptoms and signs involving the nervous system  Other lack of coordination  Stiffness of left elbow, not  elsewhere classified  Stiffness of right elbow, not elsewhere classified  Unsteadiness on feet  Muscle weakness (generalized)  Other abnormalities of gait and mobility  Abnormal posture  Rationale for Evaluation and Treatment: Rehabilitation  SUBJECTIVE:   SUBJECTIVE STATEMENT: Pt reports he is exercising some at home  Pt accompanied by: wife [name] by [name]  PERTINENT HISTORY: Pt reports he had a L hip replacement in 2019 and dislocated it 3 times. Re did it 2023, had a fall and jammed it and had to revise it 10/01/23. Was told not to go too heavy over 110 lbs on the leg press. Was told not to cross his legs  PMH: Parkinson's Disease, hypothyroidism, hx of multiple concussions, GERD, benign essential HTN, abnormal gait, L THA 2019, removal of T1 paraspinous mass 2017, T11-12 and L2-5 lumbar lami/decompression 2015, cervical fusion C3-T1 2013, hx of shoulder surgery June 2020, hip surgery revision 10/01/23 after multiple dislocations  PRECAUTIONS:follow hip precautions avoid bending greater than 90, avoid rotating hip/ foot inwards/ outwards, do not cross legs Other: hip revision, told not to cross legs  WEIGHT BEARING RESTRICTIONS: No  PAIN:  Are you having pain? Yes: NPRS scale: 3/10 Pain location: R wrist Pain description: aching Aggravating factors: wrist extension Relieving factors: rest  FALLS: Has patient fallen in last 6 months? Yes. Number of falls 3, Pt saw PT to address  LIVING ENVIRONMENT: Lives with: lives with their family and lives with their spouse Lives in: House/apartment  Has following equipment at home: Environmental Consultant - 4 wheeled and shower chair  PLOF: Independent with basic ADLs  PATIENT GOALS: improve grip and coordination, wants to play guitar better  OBJECTIVE:  Note: Objective measures were completed at Evaluation unless otherwise noted.  HAND DOMINANCE: Left  ADLs:  Overall ADLs: increased time required Transfers/ambulation related to  ADLs: Eating: difficulty holding knife to cut food  Grooming: mod I UB Dressing:needs assist with buttons at times LB Dressing: min A at times with socks Toileting: mod  Bathing: mod I Tub Shower transfers: mod I, walk in shower with seat Equipment: Shower seat with back and Walk in shower Has an up walker at home IADLs:  Light housekeeping: Pt does laundry, and cleans up after meals  Meal Prep: wife prepares Community mobility: mod I with rollator Medication management: keeps up with meds Financial management: keeps up with finanaces Handwriting: 50% legible and Moderate micrographia  MOBILITY STATUS: Independent  POSTURE COMMENTS:  rounded shoulders, forward head, and flexed trunk    FUNCTIONAL OUTCOME MEASURES: Fastening/unfastening 3 buttons: 1 min 29 secs Physical performance test: PPT#2 (simulated eating) 14.34 with 1 drop & PPT#4 (donning/doffing jacket): NT  COORDINATION: 9 Hole Peg test: Right: 2 mins.06 sec multiple drops; Left: 43.83  sec Box and Blocks:  Right 40 blocks, Left 47blocks  UE ROM:  RUE shoulder flexion 120, elbow extension -20, LUE shoulder flexion 130, elbow extension -20, decreased bilateral grip/ composite flexion due to arthritis  COGNITION: Overall cognitive status: Within functional limits for tasks assessed  OBSERVATIONS: Bradykinesia                                                                                                                    TREATMENT DATE:04/29/24- Standing against wall for stretch in upright posture min-mod v.c PWR! hands for PWR! up rock and twist , 10 reps each min-mod v.c Flipping playing cards with big movement, then dealing playing cards with big movments, picking up and stacking coins, min-mod v.c and demonstration Fine motor coordination task to place grooved pegs into pegboard with LUE with in hand manipulation, min-mod difficulty/ v.c then removing with LUE with min-mod difficulty and in hand manipulation. Pt  practiced upright posture while walking to waiting room, min v.c Therapist recommends pt perfroms cane exercises on bed not floor due to risk for falls and hip dislocations. Pt verbalizes understanding. 04/26/24 Supine, roll under knees and 2 pillows under head(hx of neck fusion) PWR! up with min/ mod facilitation followed by cane exercises for chest press and shoulder flexion, and shoulder abdcution for bilateral UE's min v.c and facillitation/ v.c for keeping shoulders down and back,  Seated pt practiced donning doffing jacket in seated x 2 with min-mod v.c for larger amplitude movements. PWR! hands basic 4 x 10 reps each min v.c Simulated feeding with foam grip on utensil, min v.c to slow down and rest spoon between bites.  Fastening/ unfastening buttons with adapted strategeis, mod v.c for techniques, increased time and demonstration required.   04/09/24:  Pt instructed in eating strategies and simulated/practiced as able with min cueing.  Pt also instructed in writing strategies and practiced with min cueing.  Pt wrote 2 short paragraphs (self generated) in cursive with approx 95% legibility and min decr in size.  Pt demo improvement with use of frequent stretching/PWR! Hands.  04/06/24- Therapist reviewed hip prec with pt. (Avoiding bending at hip> 90, avoiding twisting hip/ foot inward and outward and avoiding crossing legs) to minimize risk of dislocation. Pt verbalized understanding. Supine, towel under low back, roll under knees and 2 pillows under head(hx of neck fusion) PWR! up with min/ mod facilitation followed by cane exercises for chest press and shoulder flexion, min v.c and facillitation/ v.c for keeping shoulders down and back, elbow extension followed by bilateral shoulder abduction, with cues  for shoulder and scapular depression, retraction. Standing PWR! rock and step, with min A for balance, pt was noted to lose balance several times. Pt was instructed not to try these standing  exercises at home. Seated PWR! hands basic 4, min-mod v.c and demonstration. Stacking and manipulating coins for increased fine motor coordiantion, mod difficulty   04/02/24:  Pt instructed in  PWR! Hands (basic 4) and coordination HEP with focus on large  amplitude movements and returned demo with min-mod cueing for positioning, to slow down, and large amplitude movements.    03/30/24- Supine, towel under low back, roll under knees and 2 pillows under head(hx of neck fusion) PWR! up with min/ mod facilitation followed by cane exercises for chest press and shoulder flexion, mod v.c and facillitation/ v.c for keeping shoulders down and back, elbow extension Seated PWR! up and PWR! rock 10 reps each, min v.c Seated at table PWR! hands basic 4, min-mod v.c Flipping and dealing playing cards with big movements, min-mod v.c and demonstration Reviewed safety for sit-stand from a chair, slowing down and avoiding twisting to protect hip.  03/25/24- eval    PATIENT EDUCATION: Education details: see above  Person educated: Patient Education method: Explanation, Demonstration, Tactile cues, Verbal cues,  Education comprehension: verbalized understanding, returned demonstration, verbal cues required, and needs further education   HOME EXERCISE PROGRAM:  03/30/24-cane exercises in supine 04/02/24:  PWR! Hands, Coordination HEP with focus on large amplitude  04/09/24:  Strategies for eating and writing  GOALS: Goals reviewed with patient? Yes  SHORT TERM GOALS: Target date: 04/23/24   I with PD specific HEP Goal status: met, 04/29/24  2.  I with adapted strategies/AE to maximize safety and I with ADLs/IADLs. Goal status: ongoing  04/29/24--needs reinforcement  3.   Pt will demonstrate improved fine motor coordination for ADLs as evidenced by decreasing 9 hole peg test score for RUE to 2 mins or less with out drops Goal status: ongoing 04/09/24  4.   Pt will demonstrate improved fine motor  coordination for ADLs as evidenced by decreasing 9 hole peg test score for LUE by 3 secs Baseline: LUE 43.83 secs Goal status:  ongoing 04/06/24  5.  Pt will demonstrate improved ease with feeding as evidenced by decreasing PPT#2 to 11.34 without drops Baseline:  Goal status: ongoing 04/06/24, 04/09/24 ongoing  6.  Pt will write a  sentence with 100% legibility and minimal decrease in letter size Goal status:  ongoing 04/06/24, 04/09/24--ongoing, approx 95% with min decr in size   LONG TERM GOALS: Target date: 06/17/24  Pt will demonstrate improved ease with fastening buttons as evidenced by decreasing 3 button/ unbutton time to :1 min 20 secs or less Baseline: 1 min 29 secs Goal status: ongoing  2.  Pt will demonstrate ability to retrieve a lightweight object at 125 shoulder flexion and -15 elbow extension with RUE Goal status: INITIAL  3. Pt will demonstrate ability to retrieve a lightweight object at 130 shoulder flexion and -15 elbow extension with LUE Goal status: INITIAL  4.  Pt will verbalize understanding of ways to prevent future PD related complications and PD community resources. Goal status: INITIAL  5.   Improve PPT#4 to 22 secs or less for increased ease with dressing Baseline: 26.24 Goal status: ongoing  6.  Pt will report dropping utensils. and  cups less frequently Goal status: INITIAL   ASSESSMENT:  CLINICAL IMPRESSION: Pt is progressing towards goals. He demonstrates improved self feeding with foam grips and he demonstrates improved fastening buttons following education. PERFORMANCE DEFICITS: in functional skills including ADLs, IADLs, coordination, dexterity, ROM, strength, flexibility, Fine motor control, Gross motor control, mobility, balance, decreased knowledge of precautions, decreased knowledge of use of DME, and UE functional use, , and psychosocial skills including coping strategies, environmental adaptation, habits, interpersonal interactions, and  routines and behaviors.   IMPAIRMENTS: are limiting patient from ADLs, IADLs, rest and sleep, play, leisure, and social participation.  COMORBIDITIES:  may have co-morbidities  that affects occupational performance. Patient will benefit from skilled OT to address above impairments and improve overall function.  MODIFICATION OR ASSISTANCE TO COMPLETE EVALUATION: No modification of tasks or assist necessary to complete an evaluation.  OT OCCUPATIONAL PROFILE AND HISTORY: Detailed assessment: Review of records and additional review of physical, cognitive, psychosocial history related to current functional performance.  CLINICAL DECISION MAKING: LOW - limited treatment options, no task modification necessary  REHAB POTENTIAL: Good  EVALUATION COMPLEXITY: Low   PLAN:  OT FREQUENCY: 2x/week  OT DURATION: 12 weeks- anticipate d/c after 8 weeks  PLANNED INTERVENTIONS: 97168 OT Re-evaluation, 97535 self care/ADL training, 02889 therapeutic exercise, 97530 therapeutic activity, 97112 neuromuscular re-education, 97140 manual therapy, 97035 ultrasound, 97018 paraffin, 02989 moist heat, 97010 cryotherapy, 97760 Orthotic Initial, 97763 Orthotic/Prosthetic subsequent, passive range of motion, balance training, functional mobility training, energy conservation, coping strategies training, patient/family education, and DME and/or AE instructions  RECOMMENDED OTHER SERVICES: n/a  CONSULTED AND AGREED WITH PLAN OF CARE: Patient and family member/caregiver- wife Rock GORY FOR NEXT SESSION:  check 9 hole peg test , coordination, ADL strategies  Hx of multiple hip dislocations, exercise caution with LE movement, twisting, no bending > 90*    Caster Fayette, OTR/L  04/29/2024, 1:48 PM

## 2024-05-03 ENCOUNTER — Ambulatory Visit: Admitting: Occupational Therapy

## 2024-05-05 ENCOUNTER — Ambulatory Visit: Admitting: Occupational Therapy

## 2024-05-06 NOTE — Therapy (Incomplete)
 OUTPATIENT OCCUPATIONAL THERAPY PARKINSON'S TREATMENT  Patient Name: Jim Carson MRN: [id number] DOB:[birth year], 73 y.o., male Today's Date: 05/06/2024  PCP: Abran Slough, MD REFERRING PROVIDER: Georgina Collar MD  END OF SESSION:         Past Medical History:  Diagnosis Date   Abnormal gait    due to parkinson's,  uses cane   Absolute anemia 03/13/2023   Allergic rhinitis    Allergy [place] 1998   Allergic to dustmits, dog hair and roaches.   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 glendia  @[facility]   Pneumonia    Sleep apnea    Vitamin B12 deficiency    Vitamin D  deficiency    Wears glasses    Past Surgical History:  Procedure Laterality Date   APPENDECTOMY  07/1964   APPENDECTOMY     CHOLECYSTECTOMY     JOINT REPLACEMENT  March 2019 and again on 09/10/2021   Left hip twice.  2nd time due to 3 dislocations   LAPAROSCOPIC CHOLECYSTECTOMY  01-21-2017   @[facility] , [place]   lower back surgery      POSTERIOR CERVICAL FUSION/FORAMINOTOMY     POSTERIOR FUSION CERVICAL SPINE  08-15-2011   @[facility] , [place]   w/ laminectomy and decompression-- C3 -- T1   POSTERIOR LAMINECTOMY / DECOMPRESSION LUMBAR SPINE  07-30-2013   @[facility] , [place]   bilateral T11 - 12,  L2 --5   REMOVAL LEFT T1 PARASPINOUS MASS  02-12-2016   @[facility] ,  [place]   desmoid fibromatosis   SHOULDER ARTHROSCOPY WITH ROTATOR CUFF REPAIR Left 12/15/2018   Procedure: SHOULDER ARTHROSCOPY, biceps tenodesis labored Debridement,  submacromial decompression;  Surgeon: Gerome Lamar, MD;  Location: [facility];  Service: Orthopedics;  Laterality: Left;  interscalene block   SPINE SURGERY     TOTAL HIP ARTHROPLASTY Left 09/18/2017   @[facility] , [place]   TOTAL HIP REVISION Left 09/10/2021   Procedure: Left hip acetabular versus total hip arthroplasty revision, constrained liner;  Surgeon: Melodi Lerner, MD;  Location: [facility];  Service: Orthopedics;  Laterality: Left;   TOTAL HIP REVISION Left 10/01/2023   Procedure: Left hip constrained liner revision;  Surgeon: Melodi Lerner, MD;  Location: [facility];  Service: Orthopedics;  Laterality: Left;    Patient Active Problem List   Diagnosis Date Noted   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   Thrombocytopenia 03/17/2023   Absolute anemia 03/13/2023   Family history of prostate cancer 03/13/2023   History of elevated PSA 03/13/2023   AV block, 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  Rationale for Evaluation and Treatment: Rehabilitation  SUBJECTIVE:   SUBJECTIVE STATEMENT: *** Pt reports he is exercising some at home  Pt accompanied by: wife [name] by [name]  PERTINENT HISTORY: Pt reports he had a L hip replacement in 2019 and dislocated it 3 times. Re did it 2023, had a fall and jammed it and had to revise it 10/01/23. Was told not to go too heavy over 110 lbs on the leg press. Was told not to cross his legs  PMH: Parkinson's  Disease, hypothyroidism, hx of multiple concussions, GERD, benign essential HTN, abnormal gait, L THA 2019, removal of T1 paraspinous mass 2017, T11-12 and L2-5 lumbar lami/decompression 2015, cervical fusion C3-T1 2013, hx of shoulder surgery June 2020, hip surgery revision 10/01/23 after multiple dislocations  PRECAUTIONS:follow hip precautions avoid bending greater than 90, avoid rotating hip/ foot inwards/ outwards, do not cross legs Other: hip revision, told not to cross legs  WEIGHT BEARING RESTRICTIONS: No  PAIN:  Are you having pain? Yes: NPRS scale: 3/10 Pain location: R wrist Pain description: aching Aggravating factors: wrist extension Relieving factors: rest  FALLS: Has patient fallen in last 6 months? Yes. Number of falls 3, Pt saw PT to address  LIVING ENVIRONMENT: Lives with: lives with their family and lives with their spouse Lives in: House/apartment  Has following equipment at home: Environmental Consultant - 4 wheeled and shower chair  PLOF: Independent with basic ADLs  PATIENT GOALS: improve grip and coordination, wants to play guitar better  OBJECTIVE:  Note: Objective measures were completed at Evaluation unless otherwise noted.  HAND DOMINANCE: Left  ADLs:  Overall ADLs: increased time required Transfers/ambulation related to ADLs: Eating: difficulty holding knife to cut food  Grooming: mod I UB Dressing:needs assist with buttons at times LB Dressing: min A at times with socks Toileting: mod  Bathing: mod I Tub Shower transfers: mod I, walk in shower with seat Equipment: Shower seat with back and Walk in shower Has an up walker at home IADLs:  Light housekeeping: Pt does laundry, and cleans up after meals  Meal Prep: wife prepares Community mobility: mod I with rollator Medication management: keeps up with meds Financial management: keeps up with finanaces Handwriting: 50% legible and Moderate micrographia  MOBILITY STATUS: Independent  POSTURE COMMENTS:   rounded shoulders, forward head, and flexed trunk    FUNCTIONAL OUTCOME MEASURES: Fastening/unfastening 3 buttons: 1 min 29 secs Physical performance test: PPT#2 (simulated eating) 14.34 with 1 drop & PPT#4 (donning/doffing jacket): NT  COORDINATION: 9 Hole Peg test: Right: 2 mins.06 sec multiple drops; Left: 43.83 sec Box and Blocks:  Right 40 blocks, Left 47blocks  UE ROM:  RUE shoulder flexion 120, elbow extension -20, LUE shoulder flexion 130, elbow extension -20, decreased bilateral grip/ composite flexion due to arthritis  COGNITION: Overall cognitive status: Within functional limits for tasks assessed  OBSERVATIONS: Bradykinesia  TREATMENT DATE:  ***:  ***  04/29/24- Standing against wall for stretch in upright posture min-mod v.c PWR! hands for PWR! up rock and twist , 10 reps each min-mod v.c Flipping playing cards with big movement, then dealing playing cards with big movments, picking up and stacking coins, min-mod v.c and demonstration Fine motor coordination task to place grooved pegs into pegboard with LUE with in hand manipulation, min-mod difficulty/ v.c then removing with LUE with min-mod difficulty and in hand manipulation. Pt practiced upright posture while walking to waiting room, min v.c Therapist recommends pt perfroms cane exercises on bed not floor due to risk for falls and hip dislocations. Pt verbalizes understanding.  04/26/24 Supine, roll under knees and 2 pillows under head(hx of neck fusion) PWR! up with min/ mod facilitation followed by cane exercises for chest press and shoulder flexion, and shoulder abdcution for bilateral UE's min v.c and facillitation/ v.c for keeping shoulders down and back,  Seated pt practiced donning doffing jacket in seated x 2 with min-mod v.c for larger amplitude movements. PWR! hands basic 4 x 10 reps each min  v.c Simulated feeding with foam grip on utensil, min v.c to slow down and rest spoon between bites.  Fastening/ unfastening buttons with adapted strategeis, mod v.c for techniques, increased time and demonstration required.  04/09/24:  Pt instructed in eating strategies and simulated/practiced as able with min cueing.  Pt also instructed in writing strategies and practiced with min cueing.  Pt wrote 2 short paragraphs (self generated) in cursive with approx 95% legibility and min decr in size.  Pt demo improvement with use of frequent stretching/PWR! Hands.  04/06/24- Therapist reviewed hip prec with pt. (Avoiding bending at hip> 90, avoiding twisting hip/ foot inward and outward and avoiding crossing legs) to minimize risk of dislocation. Pt verbalized understanding. Supine, towel under low back, roll under knees and 2 pillows under head(hx of neck fusion) PWR! up with min/ mod facilitation followed by cane exercises for chest press and shoulder flexion, min v.c and facillitation/ v.c for keeping shoulders down and back, elbow extension followed by bilateral shoulder abduction, with cues  for shoulder and scapular depression, retraction. Standing PWR! rock and step, with min A for balance, pt was noted to lose balance several times. Pt was instructed not to try these standing exercises at home. Seated PWR! hands basic 4, min-mod v.c and demonstration. Stacking and manipulating coins for increased fine motor coordiantion, mod difficulty   04/02/24:  Pt instructed in  PWR! Hands (basic 4) and coordination HEP with focus on large amplitude movements and returned demo with min-mod cueing for positioning, to slow down, and large amplitude movements.    03/30/24- Supine, towel under low back, roll under knees and 2 pillows under head(hx of neck fusion) PWR! up with min/ mod facilitation followed by cane exercises for chest press and shoulder flexion, mod v.c and facillitation/ v.c for keeping shoulders down  and back, elbow extension Seated PWR! up and PWR! rock 10 reps each, min v.c Seated at table PWR! hands basic 4, min-mod v.c Flipping and dealing playing cards with big movements, min-mod v.c and demonstration Reviewed safety for sit-stand from a chair, slowing down and avoiding twisting to protect hip.  03/25/24- eval    PATIENT EDUCATION: Education details: *** see above  Person educated: Patient Education method: Explanation, Demonstration, Tactile cues, Verbal cues,  Education comprehension: verbalized understanding, returned demonstration, verbal cues required, and needs further education   HOME EXERCISE PROGRAM:  03/30/24-cane  exercises in supine 04/02/24:  PWR! Hands, Coordination HEP with focus on large amplitude  04/09/24:  Strategies for eating and writing  GOALS: Goals reviewed with patient? Yes  SHORT TERM GOALS: Target date: 04/23/24   I with PD specific HEP Goal status: met, 04/29/24  2.  I with adapted strategies/AE to maximize safety and I with ADLs/IADLs. Goal status: ongoing  04/29/24--needs reinforcement  3.   Pt will demonstrate improved fine motor coordination for ADLs as evidenced by decreasing 9 hole peg test score for RUE to 2 mins or less with out drops Goal status: ongoing 04/09/24  4.   Pt will demonstrate improved fine motor coordination for ADLs as evidenced by decreasing 9 hole peg test score for LUE by 3 secs Baseline: LUE 43.83 secs Goal status:  ongoing 04/06/24  5.  Pt will demonstrate improved ease with feeding as evidenced by decreasing PPT#2 to 11.34 without drops Baseline:  Goal status: ongoing 04/06/24, 04/09/24 ongoing  6.  Pt will write a  sentence with 100% legibility and minimal decrease in letter size Goal status:  ongoing 04/06/24, 04/09/24--ongoing, approx 95% with min decr in size    LONG TERM GOALS: Target date: 06/17/24  Pt will demonstrate improved ease with fastening buttons as evidenced by decreasing 3 button/ unbutton time  to :1 min 20 secs or less Baseline: 1 min 29 secs Goal status: ongoing  2.  Pt will demonstrate ability to retrieve a lightweight object at 125 shoulder flexion and -15 elbow extension with RUE Goal status: INITIAL  3. Pt will demonstrate ability to retrieve a lightweight object at 130 shoulder flexion and -15 elbow extension with LUE Goal status: INITIAL  4.  Pt will verbalize understanding of ways to prevent future PD related complications and PD community resources. Goal status: INITIAL  5.   Improve PPT#4 to 22 secs or less for increased ease with dressing Baseline: 26.24 Goal status: ongoing  6.  Pt will report dropping utensils. and  cups less frequently Goal status: INITIAL   ASSESSMENT:  CLINICAL IMPRESSION: *** Pt is progressing towards goals. He demonstrates improved self feeding with foam grips and he demonstrates improved fastening buttons following education.  PERFORMANCE DEFICITS: in functional skills including ADLs, IADLs, coordination, dexterity, ROM, strength, flexibility, Fine motor control, Gross motor control, mobility, balance, decreased knowledge of precautions, decreased knowledge of use of DME, and UE functional use, , and psychosocial skills including coping strategies, environmental adaptation, habits, interpersonal interactions, and routines and behaviors.   IMPAIRMENTS: are limiting patient from ADLs, IADLs, rest and sleep, play, leisure, and social participation.   COMORBIDITIES:  may have co-morbidities  that affects occupational performance. Patient will benefit from skilled OT to address above impairments and improve overall function.  MODIFICATION OR ASSISTANCE TO COMPLETE EVALUATION: No modification of tasks or assist necessary to complete an evaluation.  OT OCCUPATIONAL PROFILE AND HISTORY: Detailed assessment: Review of records and additional review of physical, cognitive, psychosocial history related to current functional performance.  CLINICAL  DECISION MAKING: LOW - limited treatment options, no task modification necessary  REHAB POTENTIAL: Good  EVALUATION COMPLEXITY: Low   PLAN:  OT FREQUENCY: 2x/week  OT DURATION: 12 weeks- anticipate d/c after 8 weeks  PLANNED INTERVENTIONS: 97168 OT Re-evaluation, 97535 self care/ADL training, 02889 therapeutic exercise, 97530 therapeutic activity, 97112 neuromuscular re-education, 97140 manual therapy, 97035 ultrasound, 97018 paraffin, 02989 moist heat, 97010 cryotherapy, 97760 Orthotic Initial, 97763 Orthotic/Prosthetic subsequent, passive range of motion, balance training, functional mobility training, energy conservation, coping  strategies training, patient/family education, and DME and/or AE instructions  RECOMMENDED OTHER SERVICES: n/a  CONSULTED AND AGREED WITH PLAN OF CARE: Patient and family member/caregiver- wife Rock GORY FOR NEXT SESSION:  *** check 9 hole peg test , coordination, ADL strategies  Hx of multiple hip dislocations, exercise caution with LE movement, twisting, no bending > 90*    Akane Tessier, OTR/L  05/06/2024, 11:12 PM

## 2024-05-07 ENCOUNTER — Ambulatory Visit: Admitting: Occupational Therapy

## 2024-05-11 ENCOUNTER — Ambulatory Visit: Attending: Neurology | Admitting: Occupational Therapy

## 2024-05-11 DIAGNOSIS — R278 Other lack of coordination: Secondary | ICD-10-CM | POA: Diagnosis present

## 2024-05-11 DIAGNOSIS — M25622 Stiffness of left elbow, not elsewhere classified: Secondary | ICD-10-CM | POA: Diagnosis present

## 2024-05-11 DIAGNOSIS — R2689 Other abnormalities of gait and mobility: Secondary | ICD-10-CM | POA: Diagnosis present

## 2024-05-11 DIAGNOSIS — R29818 Other symptoms and signs involving the nervous system: Secondary | ICD-10-CM | POA: Insufficient documentation

## 2024-05-11 DIAGNOSIS — M6281 Muscle weakness (generalized): Secondary | ICD-10-CM | POA: Diagnosis present

## 2024-05-11 DIAGNOSIS — M25621 Stiffness of right elbow, not elsewhere classified: Secondary | ICD-10-CM | POA: Diagnosis present

## 2024-05-11 DIAGNOSIS — R2681 Unsteadiness on feet: Secondary | ICD-10-CM | POA: Insufficient documentation

## 2024-05-11 DIAGNOSIS — R293 Abnormal posture: Secondary | ICD-10-CM | POA: Diagnosis present

## 2024-05-11 NOTE — Therapy (Addendum)
 OUTPATIENT OCCUPATIONAL THERAPY PARKINSON'S TREATMENT  Patient Name: Jim Carson MRN: [id number] DOB:07-07-1950, 73 y.o., male Today's Date: 05/11/2024  PCP: Abran Slough, MD REFERRING PROVIDER: Georgina Collar MD  END OF SESSION:  OT End of Session - 05/11/24 1119     Visit Number 8    Number of Visits 25    Date for Recertification  06/17/24    Authorization Type Medicare    Authorization Time Period 12 weeks, anticipate d/c after 8 weeks    Authorization - Visit Number 8    Progress Note Due on Visit 10    OT Start Time 1107    OT Stop Time 1145    OT Time Calculation (min) 38 min                Past Medical History:  Diagnosis Date   Abnormal gait    due to parkinson's,  uses cane   Absolute anemia 03/13/2023   Allergic rhinitis    Allergy [place] 1998   Allergic to dustmits, dog hair and roaches.   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 glendia  @[facility]   Pneumonia    Sleep apnea    Vitamin B12 deficiency    Vitamin D  deficiency    Wears glasses    Past Surgical History:  Procedure Laterality Date   APPENDECTOMY  07/1964   APPENDECTOMY     CHOLECYSTECTOMY     JOINT REPLACEMENT  March 2019 and again on 09/10/2021   Left hip twice.  2nd time due to 3 dislocations   LAPAROSCOPIC CHOLECYSTECTOMY  01-21-2017   @[facility] , [place]   lower back surgery      POSTERIOR CERVICAL FUSION/FORAMINOTOMY     POSTERIOR FUSION CERVICAL SPINE  08-15-2011   @[facility] , [place]   w/  laminectomy and decompression-- C3 -- T1   POSTERIOR LAMINECTOMY / DECOMPRESSION LUMBAR SPINE  07-30-2013   @[facility] , [place]   bilateral T11 - 12,  L2 --5   REMOVAL LEFT T1 PARASPINOUS MASS  02-12-2016   @[facility] ,  [place]   desmoid fibromatosis   SHOULDER ARTHROSCOPY WITH ROTATOR CUFF REPAIR Left 12/15/2018   Procedure: SHOULDER ARTHROSCOPY, biceps tenodesis labored Debridement, submacromial decompression;  Surgeon: Gerome Lamar, MD;  Location: [facility];  Service: Orthopedics;  Laterality: Left;  interscalene block   SPINE SURGERY     TOTAL HIP ARTHROPLASTY Left 09/18/2017   @[facility] , [place]   TOTAL HIP REVISION Left 09/10/2021   Procedure: Left hip acetabular versus total hip arthroplasty revision, constrained liner;  Surgeon: Melodi Lerner, MD;  Location: [facility];  Service: Orthopedics;  Laterality: Left;   TOTAL HIP REVISION Left 10/01/2023   Procedure: Left hip constrained liner revision;  Surgeon: Melodi Lerner, MD;  Location: [facility];  Service: Orthopedics;  Laterality: Left;    Patient Active Problem List   Diagnosis Date Noted   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  Thrombocytopenia 03/17/2023   Absolute anemia 03/13/2023   Family history of prostate cancer 03/13/2023   History of elevated PSA 03/13/2023   AV block, 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   Drug induced constipation 12/30/2022   Acquired hypothyroidism 12/30/2022   Primary osteoarthritis of left hip 05/18/2021   PD (Parkinson's disease) (HCC) 05/18/2021   History of gastroesophageal reflux (GERD) 05/18/2021   OSA (obstructive sleep apnea) 05/18/2021   Essential hypertension 05/18/2021    ONSET DATE: referral 12/09/23  REFERRING DIAG:  Diagnosis  G20.B2 (ICD-10-CM) - Parkinson's disease with dyskinesia, with fluctuations    THERAPY DIAG:  Other symptoms and signs involving the nervous system  Other lack of coordination  Stiffness of left  elbow, not elsewhere classified  Stiffness of right elbow, not elsewhere classified  Unsteadiness on feet  Muscle weakness (generalized)  Other abnormalities of gait and mobility  Abnormal posture  Rationale for Evaluation and Treatment: Rehabilitation  SUBJECTIVE:   SUBJECTIVE STATEMENT: Pt reports his wife was sick last week  Pt accompanied by: wife [name] by [name]  PERTINENT HISTORY: Pt reports he had a L hip replacement in 2019 and dislocated it 3 times. Re did it 2023, had a fall and jammed it and had to revise it 10/01/23. Was told not to go too heavy over 110 lbs on the leg press. Was told not to cross his legs  PMH: Parkinson's Disease, hypothyroidism, hx of multiple concussions, GERD, benign essential HTN, abnormal gait, L THA 2019, removal of T1 paraspinous mass 2017, T11-12 and L2-5 lumbar lami/decompression 2015, cervical fusion C3-T1 2013, hx of shoulder surgery June 2020, hip surgery revision 10/01/23 after multiple dislocations  PRECAUTIONS:follow hip precautions avoid bending greater than 90, avoid rotating hip/ foot inwards/ outwards, do not cross legs Other: hip revision, told not to cross legs  WEIGHT BEARING RESTRICTIONS: No  PAIN:  Are you having pain? Yes: NPRS scale: 2-5/10 Pain location: back Pain description: aching Aggravating factors: wrist extension Relieving factors: rest  FALLS: Has patient fallen in last 6 months? Yes. Number of falls 3, Pt saw PT to address  LIVING ENVIRONMENT: Lives with: lives with their family and lives with their spouse Lives in: House/apartment  Has following equipment at home: Environmental Consultant - 4 wheeled and shower chair  PLOF: Independent with basic ADLs  PATIENT GOALS: improve grip and coordination, wants to play guitar better  OBJECTIVE:  Note: Objective measures were completed at Evaluation unless otherwise noted.  HAND DOMINANCE: Left  ADLs:  Overall ADLs: increased time required Transfers/ambulation related to  ADLs: Eating: difficulty holding knife to cut food  Grooming: mod I UB Dressing:needs assist with buttons at times LB Dressing: min A at times with socks Toileting: mod  Bathing: mod I Tub Shower transfers: mod I, walk in shower with seat Equipment: Shower seat with back and Walk in shower Has an up walker at home IADLs:  Light housekeeping: Pt does laundry, and cleans up after meals  Meal Prep: wife prepares Community mobility: mod I with rollator Medication management: keeps up with meds Financial management: keeps up with finanaces Handwriting: 50% legible and Moderate micrographia  MOBILITY STATUS: Independent  POSTURE COMMENTS:  rounded shoulders, forward head, and flexed trunk    FUNCTIONAL OUTCOME MEASURES: Fastening/unfastening 3 buttons: 1 min 29 secs Physical performance test: PPT#2 (simulated eating) 14.34 with 1 drop & PPT#4 (donning/doffing jacket): NT  COORDINATION: 9 Hole Peg test: Right: 2 mins.06 sec multiple drops; Left: 43.83 sec  Box and Blocks:  Right 40 blocks, Left 47blocks  UE ROM:  RUE shoulder flexion 120, elbow extension -20, LUE shoulder flexion 130, elbow extension -20, decreased bilateral grip/ composite flexion due to arthritis  COGNITION: Overall cognitive status: Within functional limits for tasks assessed  OBSERVATIONS: Bradykinesia                                                                                                                    TREATMENT DATE:05/11/24 Pt missed therapy last week due to pt's wife's illness.  supine on mat with hotpack underback, 2 pillows under head and roll under knees PWR! up with min/ mod facilitation followed by cane exercises for chest press and shoulder flexion, bilateral shoulder abduction- then clapping hands together over chest, no twist due to hx of hip dislocation, no adverse reactions to heat x 15 mins, pain decreased Seated, PWR up for arms without leaning forward then reaching to side  unilaterally with right and left UE's, mod v.c Standing a t countertop, stepping to left na dright sides while holding onto ocuntetop then PWR! rock , then stepping while reaching for targets on cabinets, min-mod v.c for amplitude and posture, close supervision for balance PWR! hands for PWR! up, twwist and step. Therapist reviewed dealing cards with thumb for left and right hands. Pt was encouraged to perform coorincaiton activities at  home. Therapist checked bilateral 9 hole peg test, see goals below.   04/29/24- Standing against wall for stretch in upright posture min-mod v.c PWR! hands for PWR! up rock and twist , 10 reps each min-mod v.c Flipping playing cards with big movement, then dealing playing cards with big movments, picking up and stacking coins, min-mod v.c and demonstration Fine motor coordination task to place grooved pegs into pegboard with LUE with in hand manipulation, min-mod difficulty/ v.c then removing with LUE with min-mod difficulty and in hand manipulation. Pt practiced upright posture while walking to waiting room, min v.c Therapist recommends pt performs cane exercises on bed not floor due to risk for falls and hip dislocations. Pt verbalizes understanding. 04/26/24 Supine, roll under knees and 2 pillows under head(hx of neck fusion) PWR! up with min/ mod facilitation followed by cane exercises for chest press and shoulder flexion, and shoulder abdcution for bilateral UE's min v.c and facillitation/ v.c for keeping shoulders down and back,  Seated pt practiced donning doffing jacket in seated x 2 with min-mod v.c for larger amplitude movements. PWR! hands basic 4 x 10 reps each min v.c Simulated feeding with foam grip on utensil, min v.c to slow down and rest spoon between bites.  Fastening/ unfastening buttons with adapted strategeis, mod v.c for techniques, increased time and demonstration required.   04/09/24:  Pt instructed in eating strategies and  simulated/practiced as able with min cueing.  Pt also instructed in writing strategies and practiced with min cueing.  Pt wrote 2 short paragraphs (self generated) in cursive with approx 95% legibility and min decr in size.  Pt demo improvement  with use of frequent stretching/PWR! Hands.  04/06/24- Therapist reviewed hip prec with pt. (Avoiding bending at hip> 90, avoiding twisting hip/ foot inward and outward and avoiding crossing legs) to minimize risk of dislocation. Pt verbalized understanding. Supine, towel under low back, roll under knees and 2 pillows under head(hx of neck fusion) PWR! up with min/ mod facilitation followed by cane exercises for chest press and shoulder flexion, min v.c and facillitation/ v.c for keeping shoulders down and back, elbow extension followed by bilateral shoulder abduction, with cues  for shoulder and scapular depression, retraction. Standing PWR! rock and step, with min A for balance, pt was noted to lose balance several times. Pt was instructed not to try these standing exercises at home. Seated PWR! hands basic 4, min-mod v.c and demonstration. Stacking and manipulating coins for increased fine motor coordiantion, mod difficulty   04/02/24:  Pt instructed in  PWR! Hands (basic 4) and coordination HEP with focus on large amplitude movements and returned demo with min-mod cueing for positioning, to slow down, and large amplitude movements.    03/30/24- Supine, towel under low back, roll under knees and 2 pillows under head(hx of neck fusion) PWR! up with min/ mod facilitation followed by cane exercises for chest press and shoulder flexion, mod v.c and facillitation/ v.c for keeping shoulders down and back, elbow extension Seated PWR! up and PWR! rock 10 reps each, min v.c Seated at table PWR! hands basic 4, min-mod v.c Flipping and dealing playing cards with big movements, min-mod v.c and demonstration Reviewed safety for sit-stand from a chair, slowing down and  avoiding twisting to protect hip.  03/25/24- eval    PATIENT EDUCATION: Education details: see above  Person educated: Patient Education method: Explanation, Demonstration, Tactile cues, Verbal cues,  Education comprehension: verbalized understanding, returned demonstration, verbal cues required, and needs further education   HOME EXERCISE PROGRAM:  03/30/24-cane exercises in supine 04/02/24:  PWR! Hands, Coordination HEP with focus on large amplitude  04/09/24:  Strategies for eating and writing  GOALS: Goals reviewed with patient? Yes  SHORT TERM GOALS: Target date: 04/23/24   I with PD specific HEP Goal status: met, 04/29/24  2.  I with adapted strategies/AE to maximize safety and I with ADLs/IADLs. Goal status: ongoing  04/29/24--needs reinforcement  3.   Pt will demonstrate improved fine motor coordination for ADLs as evidenced by decreasing 9 hole peg test score for RUE to 2 mins or less with out drops Goal status: ongoing, improved -2 mins 3 secs 05/11/24  4.   Pt will demonstrate improved fine motor coordination for ADLs as evidenced by decreasing 9 hole peg test score for LUE by 3 secs Baseline: LUE 43.83 secs Goal status:  met, 37.10 05/11/24  5.  Pt will demonstrate improved ease with feeding as evidenced by decreasing PPT#2 to 11.34 without drops Baseline:  Goal status: ongoing 04/06/24, 05/11/24- ongoing  6.  Pt will write a  sentence with 100% legibility and minimal decrease in letter size Goal status:  ongoing 04/06/24, 04/09/24--ongoing, approx 95% with min decr in size   LONG TERM GOALS: Target date: 06/17/24  Pt will demonstrate improved ease with fastening buttons as evidenced by decreasing 3 button/ unbutton time to :1 min 20 secs or less Baseline: 1 min 29 secs Goal status: ongoing  2.  Pt will demonstrate ability to retrieve a lightweight object at 125 shoulder flexion and -15 elbow extension with RUE Goal status: INITIAL  3. Pt will demonstrate  ability to  retrieve a lightweight object at 130 shoulder flexion and -15 elbow extension with LUE Goal status: INITIAL  4.  Pt will verbalize understanding of ways to prevent future PD related complications and PD community resources. Goal status: INITIAL  5.   Improve PPT#4 to 22 secs or less for increased ease with dressing Baseline: 26.24 Goal status: ongoing  6.  Pt will report dropping utensils. and  cups less frequently Goal status: INITIAL   ASSESSMENT:  CLINICAL IMPRESSION: Pt is progressing towards goals. He met STG #4 with improved LUE coordination. Pt missed last week due to pt's wife was sick. He is going out of town for a week starting Thursday.PERFORMANCE DEFICITS: in functional skills including ADLs, IADLs, coordination, dexterity, ROM, strength, flexibility, Fine motor control, Gross motor control, mobility, balance, decreased knowledge of precautions, decreased knowledge of use of DME, and UE functional use, , and psychosocial skills including coping strategies, environmental adaptation, habits, interpersonal interactions, and routines and behaviors.   IMPAIRMENTS: are limiting patient from ADLs, IADLs, rest and sleep, play, leisure, and social participation.   COMORBIDITIES:  may have co-morbidities  that affects occupational performance. Patient will benefit from skilled OT to address above impairments and improve overall function.  MODIFICATION OR ASSISTANCE TO COMPLETE EVALUATION: No modification of tasks or assist necessary to complete an evaluation.  OT OCCUPATIONAL PROFILE AND HISTORY: Detailed assessment: Review of records and additional review of physical, cognitive, psychosocial history related to current functional performance.  CLINICAL DECISION MAKING: LOW - limited treatment options, no task modification necessary  REHAB POTENTIAL: Good  EVALUATION COMPLEXITY: Low   PLAN:  OT FREQUENCY: 2x/week  OT DURATION: 12 weeks- anticipate d/c after 8  weeks  PLANNED INTERVENTIONS: 97168 OT Re-evaluation, 97535 self care/ADL training, 02889 therapeutic exercise, 97530 therapeutic activity, 97112 neuromuscular re-education, 97140 manual therapy, 97035 ultrasound, 97018 paraffin, 02989 moist heat, 97010 cryotherapy, 97760 Orthotic Initial, 97763 Orthotic/Prosthetic subsequent, passive range of motion, balance training, functional mobility training, energy conservation, coping strategies training, patient/family education, and DME and/or AE instructions  RECOMMENDED OTHER SERVICES: n/a  CONSULTED AND AGREED WITH PLAN OF CARE: Patient and family adult nurse- wife Rock GORY FOR NEXT SESSION: work towards unmet STG's, check PPT#2, handwriting, ADL strategies  Hx of multiple hip dislocations, exercise caution with LE movement, twisting, no bending > 90*    Ehab Humber, OTR/L  05/11/2024, 11:23 AM

## 2024-05-14 ENCOUNTER — Ambulatory Visit: Admitting: Occupational Therapy

## 2024-05-18 ENCOUNTER — Ambulatory Visit: Admitting: Occupational Therapy

## 2024-05-20 NOTE — Therapy (Signed)
 OUTPATIENT OCCUPATIONAL THERAPY PARKINSON'S TREATMENT  Patient Name: Jim Carson MRN: [id number] DOB:15-Oct-1950, 73 y.o., male Today's Date: 05/21/2024  PCP: Abran Slough, MD REFERRING PROVIDER: Georgina Collar MD  END OF SESSION:  OT End of Session - 05/21/24 1106     Visit Number 9    Number of Visits 25    Date for Recertification  06/17/24    Authorization Type Medicare, add KX    Authorization Time Period 12 weeks, anticipate d/c after 8 weeks    Authorization - Visit Number 9    Progress Note Due on Visit 10    OT Start Time 1104    OT Stop Time 1145    OT Time Calculation (min) 41 min    Activity Tolerance Patient tolerated treatment well    Behavior During Therapy [facility] for tasks assessed/performed                 Past Medical History:  Diagnosis Date   Abnormal gait    due to parkinson's,  uses cane   Absolute anemia 03/13/2023   Allergic rhinitis    Allergy [place] 1998   Allergic to dustmits, dog hair and roaches.   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 glendia  @[facility]urology   Pneumonia    Sleep apnea    Vitamin B12 deficiency    Vitamin D  deficiency    Wears glasses    Past Surgical History:  Procedure Laterality Date   APPENDECTOMY  07/1964   APPENDECTOMY     CHOLECYSTECTOMY     JOINT REPLACEMENT  March 2019 and again on 09/10/2021   Left hip twice.  2nd time due to 3 dislocations   LAPAROSCOPIC CHOLECYSTECTOMY  01-21-2017   @[facility] , [place]   lower back surgery       POSTERIOR CERVICAL FUSION/FORAMINOTOMY     POSTERIOR FUSION CERVICAL SPINE  08-15-2011   @[facility] , [place]   w/ laminectomy and decompression-- C3 -- T1   POSTERIOR LAMINECTOMY / DECOMPRESSION LUMBAR SPINE  07-30-2013   @[facility] , [place]   bilateral T11 - 12,  L2 --5   REMOVAL LEFT T1 PARASPINOUS MASS  02-12-2016   @[facility] ,  [place]   desmoid fibromatosis   SHOULDER ARTHROSCOPY WITH ROTATOR CUFF REPAIR Left 12/15/2018   Procedure: SHOULDER ARTHROSCOPY, biceps tenodesis labored Debridement, submacromial decompression;  Surgeon: Gerome Lamar, MD;  Location: [facility];  Service: Orthopedics;  Laterality: Left;  interscalene block   SPINE SURGERY     TOTAL HIP ARTHROPLASTY Left 09/18/2017   @[facility] , [place]   TOTAL HIP REVISION Left 09/10/2021   Procedure: Left hip acetabular versus total hip arthroplasty revision, constrained liner;  Surgeon: Melodi Lerner, MD;  Location: [facility];  Service: Orthopedics;  Laterality: Left;   TOTAL HIP REVISION Left 10/01/2023   Procedure: Left hip constrained liner revision;  Surgeon: Melodi Lerner, MD;  Location: [facility];  Service: Orthopedics;  Laterality: Left;    Patient Active Problem List   Diagnosis Date Noted   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   Thrombocytopenia 03/17/2023   Absolute anemia 03/13/2023   Family history of prostate cancer 03/13/2023   History of elevated PSA 03/13/2023   AV block, 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   Drug induced constipation 12/30/2022   Acquired hypothyroidism 12/30/2022   Primary osteoarthritis of left hip 05/18/2021   PD (Parkinson's disease) (HCC) 05/18/2021   History of gastroesophageal reflux (GERD) 05/18/2021   OSA (obstructive sleep apnea) 05/18/2021   Essential hypertension 05/18/2021    ONSET DATE: referral 12/09/23  REFERRING DIAG:  Diagnosis  G20.B2 (ICD-10-CM) - Parkinson's disease with dyskinesia, with fluctuations     THERAPY DIAG:  Other symptoms and signs involving the nervous system  Other lack of coordination  Stiffness of left elbow, not elsewhere classified  Stiffness of right elbow, not elsewhere classified  Unsteadiness on feet  Muscle weakness (generalized)  Other abnormalities of gait and mobility  Abnormal posture  Rationale for Evaluation and Treatment: Rehabilitation  SUBJECTIVE:   SUBJECTIVE STATEMENT: Just got back yesterday from [place].  Doing well.  Pt accompanied by: wife [name] by [name]  PERTINENT HISTORY: Pt reports he had a L hip replacement in 2019 and dislocated it 3 times. Re did it 2023, had a fall and jammed it and had to revise it 10/01/23. Was told not to go too heavy over 110 lbs on the leg press. Was told not to cross his legs  PMH: Parkinson's Disease, hypothyroidism, hx of multiple concussions, GERD, benign essential HTN, abnormal gait, L THA 2019, removal of T1 paraspinous mass 2017, T11-12 and L2-5 lumbar lami/decompression 2015, cervical fusion C3-T1 2013, hx of shoulder surgery June 2020, hip surgery revision 10/01/23 after multiple dislocations  PRECAUTIONS:follow hip precautions avoid bending greater than 90, avoid rotating hip/ foot inwards/ outwards, do not cross legs Other: hip revision, told not to cross legs  WEIGHT BEARING RESTRICTIONS: No  PAIN:  Are you having pain? Yes: NPRS scale: 2-5/10 Pain location: back Pain description: aching Aggravating factors: wrist extension Relieving factors: rest  FALLS: Has patient fallen in last 6 months? Yes. Number of falls 3, Pt saw PT to address  LIVING ENVIRONMENT: Lives with: lives with their family and lives with their spouse Lives in: House/apartment  Has following equipment at home: Environmental Consultant - 4 wheeled and shower chair  PLOF: Independent with basic ADLs  PATIENT GOALS: improve grip and coordination, wants to play guitar better  OBJECTIVE:  Note: Objective measures were completed at  Evaluation unless otherwise noted.  HAND DOMINANCE: Left  ADLs:  Overall ADLs: increased time required Transfers/ambulation related to ADLs: Eating: difficulty holding knife to cut food  Grooming: mod I UB Dressing:needs assist with buttons at times LB Dressing: min A at times with socks Toileting: mod  Bathing: mod I Tub Shower transfers: mod I, walk in shower with seat Equipment: Shower seat with back and Walk in shower Has an up walker at home IADLs:  Light housekeeping: Pt does laundry, and cleans up after meals  Meal Prep: wife prepares Community mobility: mod I with rollator Medication management: keeps up with meds Financial management: keeps up with finanaces Handwriting: 50% legible and Moderate micrographia  MOBILITY STATUS: Independent  POSTURE COMMENTS:  rounded shoulders, forward head, and flexed trunk    FUNCTIONAL OUTCOME MEASURES: Fastening/unfastening 3 buttons: 1 min 29 secs Physical performance test: PPT#2 (simulated  eating) 14.34 with 1 drop & PPT#4 (donning/doffing jacket): NT  COORDINATION: 9 Hole Peg test: Right: 2 mins.06 sec multiple drops; Left: 43.83 sec Box and Blocks:  Right 40 blocks, Left 47blocks  UE ROM:  RUE shoulder flexion 120, elbow extension -20, LUE shoulder flexion 130, elbow extension -20, decreased bilateral grip/ composite flexion due to arthritis  COGNITION: Overall cognitive status: Within functional limits for tasks assessed  OBSERVATIONS: Bradykinesia                                                                                                                    TREATMENT DATE:  05/21/24:  Closed-chain shoulder flex in sitting with BUEs with foam noodle and min cueing for posture and elbow ext followed by diagonals to each side with gentle wt. Shift and trunk rotation with min cueing.  PWR! Hands x10 with min cueing for large amplitude movements.  Checked shoulder/elbow ROM for functional reach (see goals section  below).  Practiced functional reaching with each UE to grasp/release cylinder objects incorporating reaching laterally and across body with min cueing to slow down, large amplitude,  and track hand/object with eyes to decr drops/knocking items over.  Reviewed strategies for fastening/unfastening buttons and practiced with min v.c. (also checked goal--see below).    Practiced writing with min cueing for large amplitude.  Pt wrote 8 sentences with approx 95% legibility and min decr in size.    Checked PPT#2 (see below) with no drops, min cueing for grasp on utensils.    05/11/24 Pt missed therapy last week due to pt's wife's illness.  supine on mat with hotpack underback, 2 pillows under head and roll under knees PWR! up with min/ mod facilitation followed by cane exercises for chest press and shoulder flexion, bilateral shoulder abduction- then clapping hands together over chest, no twist due to hx of hip dislocation, no adverse reactions to heat x 15 mins, pain decreased Seated, PWR up for arms without leaning forward then reaching to side unilaterally with right and left UE's, mod v.c Standing a t countertop, stepping to left na dright sides while holding onto ocuntetop then PWR! rock , then stepping while reaching for targets on cabinets, min-mod v.c for amplitude and posture, close supervision for balance PWR! hands for PWR! up, twwist and step. Therapist reviewed dealing cards with thumb for left and right hands. Pt was encouraged to perform coorincaiton activities at  home. Therapist checked bilateral 9 hole peg test, see goals below.   04/29/24- Standing against wall for stretch in upright posture min-mod v.c PWR! hands for PWR! up rock and twist , 10 reps each min-mod v.c Flipping playing cards with big movement, then dealing playing cards with big movments, picking up and stacking coins, min-mod v.c and demonstration Fine motor coordination task to place grooved pegs into pegboard with  LUE with in hand manipulation, min-mod difficulty/ v.c then removing with LUE with min-mod difficulty and in hand manipulation. Pt practiced upright posture while walking to waiting room, min  v.c Therapist recommends pt performs cane exercises on bed not floor due to risk for falls and hip dislocations. Pt verbalizes understanding.  04/26/24 Supine, roll under knees and 2 pillows under head(hx of neck fusion) PWR! up with min/ mod facilitation followed by cane exercises for chest press and shoulder flexion, and shoulder abdcution for bilateral UE's min v.c and facillitation/ v.c for keeping shoulders down and back,  Seated pt practiced donning doffing jacket in seated x 2 with min-mod v.c for larger amplitude movements. PWR! hands basic 4 x 10 reps each min v.c Simulated feeding with foam grip on utensil, min v.c to slow down and rest spoon between bites.  Fastening/ unfastening buttons with adapted strategeis, mod v.c for techniques, increased time and demonstration required.   04/09/24:  Pt instructed in eating strategies and simulated/practiced as able with min cueing.  Pt also instructed in writing strategies and practiced with min cueing.  Pt wrote 2 short paragraphs (self generated) in cursive with approx 95% legibility and min decr in size.  Pt demo improvement with use of frequent stretching/PWR! Hands.  04/06/24- Therapist reviewed hip prec with pt. (Avoiding bending at hip> 90, avoiding twisting hip/ foot inward and outward and avoiding crossing legs) to minimize risk of dislocation. Pt verbalized understanding. Supine, towel under low back, roll under knees and 2 pillows under head(hx of neck fusion) PWR! up with min/ mod facilitation followed by cane exercises for chest press and shoulder flexion, min v.c and facillitation/ v.c for keeping shoulders down and back, elbow extension followed by bilateral shoulder abduction, with cues  for shoulder and scapular depression, retraction. Standing  PWR! rock and step, with min A for balance, pt was noted to lose balance several times. Pt was instructed not to try these standing exercises at home. Seated PWR! hands basic 4, min-mod v.c and demonstration. Stacking and manipulating coins for increased fine motor coordiantion, mod difficulty   04/02/24:  Pt instructed in  PWR! Hands (basic 4) and coordination HEP with focus on large amplitude movements and returned demo with min-mod cueing for positioning, to slow down, and large amplitude movements.    03/30/24- Supine, towel under low back, roll under knees and 2 pillows under head(hx of neck fusion) PWR! up with min/ mod facilitation followed by cane exercises for chest press and shoulder flexion, mod v.c and facillitation/ v.c for keeping shoulders down and back, elbow extension Seated PWR! up and PWR! rock 10 reps each, min v.c Seated at table PWR! hands basic 4, min-mod v.c Flipping and dealing playing cards with big movements, min-mod v.c and demonstration Reviewed safety for sit-stand from a chair, slowing down and avoiding twisting to protect hip.  03/25/24- eval    PATIENT EDUCATION: Education details:  see above for strategies and cueing Person educated: Patient Education method: Explanation, Demonstration, Tactile cues, Verbal cues,  Education comprehension: verbalized understanding, returned demonstration, verbal cues required, and needs further education   HOME EXERCISE PROGRAM:  03/30/24-cane exercises in supine 04/02/24:  PWR! Hands, Coordination HEP with focus on large amplitude  04/09/24:  Strategies for eating and writing  GOALS: Goals reviewed with patient? Yes  SHORT TERM GOALS: Target date: 04/23/24   I with PD specific HEP Goal status: met, 04/29/24  2.  I with adapted strategies/AE to maximize safety and I with ADLs/IADLs. Goal status: ongoing  04/29/24--needs reinforcement  3.   Pt will demonstrate improved fine motor coordination for ADLs as evidenced by  decreasing 9 hole peg test score for  RUE to 2 mins or less with out drops Goal status: ongoing, improved -2 mins 3 secs 05/11/24  4.   Pt will demonstrate improved fine motor coordination for ADLs as evidenced by decreasing 9 hole peg test score for LUE by 3 secs Baseline: LUE 43.83 secs Goal status:  met, 37.10 05/11/24  5.  Pt will demonstrate improved ease with feeding as evidenced by decreasing PPT#2 to 11.34 without drops Baseline:  Goal status: ongoing 04/06/24, 05/11/24- ongoing.  05/21/24 ongoing 16.31sec without drops.  6.  Pt will write a  sentence with 100% legibility and minimal decrease in letter size Goal status:  ongoing 04/06/24, 04/09/24--ongoing, approx 95% with min decr in size.  05/21/24 ongoing approx 95% legibility with min decr in size  LONG TERM GOALS: Target date: 06/17/24  Pt will demonstrate improved ease with fastening buttons as evidenced by decreasing 3 button/ unbutton time to :1 min 20 secs or less Baseline: 1 min 29 secs Goal status: 05/21/24 MET 56.19sec  2.  Pt will demonstrate ability to retrieve a lightweight object at 125 shoulder flexion and -15 elbow extension with RUE Goal status: 05/21/24  Ongoing 120* with -20 elbow ext  3. Pt will demonstrate ability to retrieve a lightweight object at 130 shoulder flexion and -15 elbow extension with LUE Goal status: 05/21/24  Ongoing/partially met 145* with -20 elbow ext  4.  Pt will verbalize understanding of ways to prevent future PD related complications and PD community resources. Goal status: INITIAL  5.   Improve PPT#4 to 22 secs or less for increased ease with dressing Baseline: 26.24 Goal status: ongoing  6.  Pt will report dropping utensils. and cups less frequently Goal status:  05/21/24  ongoing, at least 1x/day knocks things over or drops   ASSESSMENT:  CLINICAL IMPRESSION:  Pt is progressing slowly towards goals. He met LTG#4 this week (buttoning) and demo improved LUE  ROM.   PERFORMANCE DEFICITS: in functional skills including ADLs, IADLs, coordination, dexterity, ROM, strength, flexibility, Fine motor control, Gross motor control, mobility, balance, decreased knowledge of precautions, decreased knowledge of use of DME, and UE functional use, , and psychosocial skills including coping strategies, environmental adaptation, habits, interpersonal interactions, and routines and behaviors.   IMPAIRMENTS: are limiting patient from ADLs, IADLs, rest and sleep, play, leisure, and social participation.   COMORBIDITIES:  may have co-morbidities  that affects occupational performance. Patient will benefit from skilled OT to address above impairments and improve overall function.  MODIFICATION OR ASSISTANCE TO COMPLETE EVALUATION: No modification of tasks or assist necessary to complete an evaluation.  OT OCCUPATIONAL PROFILE AND HISTORY: Detailed assessment: Review of records and additional review of physical, cognitive, psychosocial history related to current functional performance.  CLINICAL DECISION MAKING: LOW - limited treatment options, no task modification necessary  REHAB POTENTIAL: Good  EVALUATION COMPLEXITY: Low   PLAN:  OT FREQUENCY: 2x/week  OT DURATION: 12 weeks- anticipate d/c after 8 weeks  PLANNED INTERVENTIONS: 97168 OT Re-evaluation, 97535 self care/ADL training, 02889 therapeutic exercise, 97530 therapeutic activity, 97112 neuromuscular re-education, 97140 manual therapy, 97035 ultrasound, 97018 paraffin, 02989 moist heat, 97010 cryotherapy, 97760 Orthotic Initial, 97763 Orthotic/Prosthetic subsequent, passive range of motion, balance training, functional mobility training, energy conservation, coping strategies training, patient/family education, and DME and/or AE instructions  RECOMMENDED OTHER SERVICES: n/a  CONSULTED AND AGREED WITH PLAN OF CARE: Patient and family adult nurse- wife Rock GORY FOR NEXT SESSION:  work towards  unmet STG's, continue with ADL strategies, functional reach  Hx of multiple hip dislocations, exercise caution with LE movement, twisting, no bending > 90*    Kolbie Lepkowski, OTR/L  05/21/2024, 12:05 PM

## 2024-05-21 ENCOUNTER — Ambulatory Visit: Admitting: Occupational Therapy

## 2024-05-21 ENCOUNTER — Encounter: Payer: Self-pay | Admitting: Occupational Therapy

## 2024-05-21 DIAGNOSIS — M6281 Muscle weakness (generalized): Secondary | ICD-10-CM

## 2024-05-21 DIAGNOSIS — R2681 Unsteadiness on feet: Secondary | ICD-10-CM

## 2024-05-21 DIAGNOSIS — R29818 Other symptoms and signs involving the nervous system: Secondary | ICD-10-CM

## 2024-05-21 DIAGNOSIS — R278 Other lack of coordination: Secondary | ICD-10-CM

## 2024-05-21 DIAGNOSIS — R293 Abnormal posture: Secondary | ICD-10-CM

## 2024-05-21 DIAGNOSIS — M25621 Stiffness of right elbow, not elsewhere classified: Secondary | ICD-10-CM

## 2024-05-21 DIAGNOSIS — R2689 Other abnormalities of gait and mobility: Secondary | ICD-10-CM

## 2024-05-21 DIAGNOSIS — M25622 Stiffness of left elbow, not elsewhere classified: Secondary | ICD-10-CM

## 2024-05-24 ENCOUNTER — Ambulatory Visit: Admitting: Occupational Therapy

## 2024-05-24 NOTE — Therapy (Unsigned)
 OUTPATIENT OCCUPATIONAL THERAPY PARKINSON'S TREATMENT  Patient Name: Jim Carson MRN: [id number] DOB:1950-12-04, 73 y.o., male Today's Date: 05/25/2024  PCP: Abran Slough, MD REFERRING PROVIDER: Georgina Collar MD  END OF SESSION:  OT End of Session - 05/25/24 1106     Visit Number 10    Number of Visits 25    Date for Recertification  06/17/24    Authorization Type Medicare, add KX    Authorization Time Period 12 weeks, anticipate d/c after 8 weeks    Authorization - Visit Number 10    Progress Note Due on Visit 10    OT Start Time 1105    OT Stop Time 1145    OT Time Calculation (min) 40 min                  Past Medical History:  Diagnosis Date   Abnormal gait    due to parkinson's,  uses cane   Absolute anemia 03/13/2023   Allergic rhinitis    Allergy [place] 1998   Allergic to dustmits, dog hair and roaches.   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 glendia  @[facility]   Pneumonia    Sleep apnea    Vitamin B12 deficiency    Vitamin D  deficiency    Wears glasses    Past Surgical History:  Procedure Laterality Date   APPENDECTOMY  07/1964   APPENDECTOMY     CHOLECYSTECTOMY     JOINT REPLACEMENT  March 2019 and again on 09/10/2021   Left hip twice.  2nd time due to 3 dislocations   LAPAROSCOPIC CHOLECYSTECTOMY  01-21-2017   @[facility] , [place]   lower back surgery      POSTERIOR CERVICAL FUSION/FORAMINOTOMY     POSTERIOR FUSION CERVICAL SPINE  08-15-2011   @[facility] , [place]    w/ laminectomy and decompression-- C3 -- T1   POSTERIOR LAMINECTOMY / DECOMPRESSION LUMBAR SPINE  07-30-2013   @[facility] , [place]   bilateral T11 - 12,  L2 --5   REMOVAL LEFT T1 PARASPINOUS MASS  02-12-2016   @[facility] ,  [place]   desmoid fibromatosis   SHOULDER ARTHROSCOPY WITH ROTATOR CUFF REPAIR Left 12/15/2018   Procedure: SHOULDER ARTHROSCOPY, biceps tenodesis labored Debridement, submacromial decompression;  Surgeon: Gerome Lamar, MD;  Location: [facility];  Service: Orthopedics;  Laterality: Left;  interscalene block   SPINE SURGERY     TOTAL HIP ARTHROPLASTY Left 09/18/2017   @[facility] , [place]   TOTAL HIP REVISION Left 09/10/2021   Procedure: Left hip acetabular versus total hip arthroplasty revision, constrained liner;  Surgeon: Melodi Lerner, MD;  Location: [facility];  Service: Orthopedics;  Laterality: Left;   TOTAL HIP REVISION Left 10/01/2023   Procedure: Left hip constrained liner revision;  Surgeon: Melodi Lerner, MD;  Location: [facility];  Service: Orthopedics;  Laterality: Left;    Patient Active Problem List   Diagnosis Date Noted   Failed  total hip arthroplasty 10/01/2023   Thrombocytopenia 03/17/2023   Absolute anemia 03/13/2023   Family history of prostate cancer 03/13/2023   History of elevated PSA 03/13/2023   AV block, 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   Drug induced constipation 12/30/2022   Acquired hypothyroidism 12/30/2022   Primary osteoarthritis of left hip 05/18/2021   PD (Parkinson's disease) (HCC) 05/18/2021   History of gastroesophageal reflux (GERD) 05/18/2021   OSA (obstructive sleep apnea) 05/18/2021   Essential hypertension 05/18/2021    ONSET DATE: referral 12/09/23  REFERRING DIAG:  Diagnosis  G20.B2 (ICD-10-CM) - Parkinson's disease with dyskinesia, with fluctuations    THERAPY DIAG:  Other symptoms and signs involving the nervous system  Other lack of coordination  Stiffness of  left elbow, not elsewhere classified  Stiffness of right elbow, not elsewhere classified  Unsteadiness on feet  Muscle weakness (generalized)  Other abnormalities of gait and mobility  Abnormal posture  Rationale for Evaluation and Treatment: Rehabilitation  SUBJECTIVE:   SUBJECTIVE STATEMENT: Pt rpeorts he had a good time in [place] by [place]  PERTINENT HISTORY: Pt reports he had a L hip replacement in 2019 and dislocated it 3 times. Re did it 2023, had a fall and jammed it and had to revise it 10/01/23. Was told not to go too heavy over 110 lbs on the leg press. Was told not to cross his legs  PMH: Parkinson's Disease, hypothyroidism, hx of multiple concussions, GERD, benign essential HTN, abnormal gait, L THA 2019, removal of T1 paraspinous mass 2017, T11-12 and L2-5 lumbar lami/decompression 2015, cervical fusion C3-T1 2013, hx of shoulder surgery June 2020, hip surgery revision 10/01/23 after multiple dislocations  PRECAUTIONS:follow hip precautions avoid bending greater than 90, avoid rotating hip/ foot inwards/ outwards, do not cross legs Other: hip revision, told not to cross legs  WEIGHT BEARING RESTRICTIONS: No  PAIN:  Are you having pain? Yes: NPRS scale: 1-2/10 Pain location: hip, wrist Pain description: aching Aggravating factors: wrist extension Relieving factors: rest  FALLS: Has patient fallen in last 6 months? Yes. Number of falls 3, Pt saw PT to address  LIVING ENVIRONMENT: Lives with: lives with their family and lives with their spouse Lives in: House/apartment  Has following equipment at home: Environmental Consultant - 4 wheeled and shower chair  PLOF: Independent with basic ADLs  PATIENT GOALS: improve grip and coordination, wants to play guitar better  OBJECTIVE:  Note: Objective measures were completed at Evaluation unless otherwise noted.  HAND DOMINANCE: Left  ADLs:  Overall ADLs: increased time required Transfers/ambulation related to ADLs: Eating:  difficulty holding knife to cut food  Grooming: mod I UB Dressing:needs assist with buttons at times LB Dressing: min A at times with socks Toileting: mod  Bathing: mod I Tub Shower transfers: mod I, walk in shower with seat Equipment: Shower seat with back and Walk in shower Has an up walker at home IADLs:  Light housekeeping: Pt does laundry, and cleans up after meals  Meal Prep: wife prepares Community mobility: mod I with rollator Medication management: keeps up with meds Financial management: keeps up with finanaces Handwriting: 50% legible and Moderate micrographia  MOBILITY STATUS: Independent  POSTURE COMMENTS:  rounded shoulders, forward head, and flexed trunk    FUNCTIONAL OUTCOME MEASURES: Fastening/unfastening 3 buttons: 1 min 29 secs Physical performance test: PPT#2 (simulated eating) 14.34 with 1 drop & PPT#4 (donning/doffing jacket): NT  COORDINATION: 9 Hole Peg test: Right: 2 mins.06 sec multiple drops;  Left: 43.83 sec Box and Blocks:  Right 40 blocks, Left 47blocks  UE ROM:  RUE shoulder flexion 120, elbow extension -20, LUE shoulder flexion 130, elbow extension -20, decreased bilateral grip/ composite flexion due to arthritis  COGNITION: Overall cognitive status: Within functional limits for tasks assessed  OBSERVATIONS: Bradykinesia                                                                                                                    TREATMENT DATE: 05/25/24-Therapist checked progress in prep for progress note, see short term goals below. Handwriting activity for continuoue l, continuouse followed by writing activity, pt was able to write a sentnece with 100% legibility and minimal decrease in letter size. PWR! hands for PWR! up prior to task discussed importance of avoiding twisting at hips to avoid hip dislocation.  Standing to perfrom PWR! rock and step at countertop, marching min guard for balance, min v.c for amplitude Seated PWR!  up without rocking forwards, then closed chin shoulder flexion with cane, min v.c discussed avoiding biceps curls at the gym due to likelihood of making elbows tigh and difficulty with elbow extension UBE x 5 mins level 1 for conditioning  05/21/24:  Closed-chain shoulder flex in sitting with BUEs with foam noodle and min cueing for posture and elbow ext followed by diagonals to each side with gentle wt. Shift and trunk rotation with min cueing.  PWR! Hands x10 with min cueing for large amplitude movements.  Checked shoulder/elbow ROM for functional reach (see goals section below).  Practiced functional reaching with each UE to grasp/release cylinder objects incorporating reaching laterally and across body with min cueing to slow down, large amplitude,  and track hand/object with eyes to decr drops/knocking items over.  Reviewed strategies for fastening/unfastening buttons and practiced with min v.c. (also checked goal--see below).    Practiced writing with min cueing for large amplitude.  Pt wrote 8 sentences with approx 95% legibility and min decr in size.    Checked PPT#2 (see below) with no drops, min cueing for grasp on utensils.    05/11/24 Pt missed therapy last week due to pt's wife's illness.  supine on mat with hotpack underback, 2 pillows under head and roll under knees PWR! up with min/ mod facilitation followed by cane exercises for chest press and shoulder flexion, bilateral shoulder abduction- then clapping hands together over chest, no twist due to hx of hip dislocation, no adverse reactions to heat x 15 mins, pain decreased Seated, PWR up for arms without leaning forward then reaching to side unilaterally with right and left UE's, mod v.c Standing a t countertop, stepping to left na dright sides while holding onto ocuntetop then PWR! rock , then stepping while reaching for targets on cabinets, min-mod v.c for amplitude and posture, close supervision for balance PWR! hands for  PWR! up, twwist and step. Therapist reviewed dealing cards with thumb for left and right hands. Pt was encouraged to perform coorincaiton activities at  home. Therapist checked  bilateral 9 hole peg test, see goals below.   04/29/24- Standing against wall for stretch in upright posture min-mod v.c PWR! hands for PWR! up rock and twist , 10 reps each min-mod v.c Flipping playing cards with big movement, then dealing playing cards with big movments, picking up and stacking coins, min-mod v.c and demonstration Fine motor coordination task to place grooved pegs into pegboard with LUE with in hand manipulation, min-mod difficulty/ v.c then removing with LUE with min-mod difficulty and in hand manipulation. Pt practiced upright posture while walking to waiting room, min v.c Therapist recommends pt performs cane exercises on bed not floor due to risk for falls and hip dislocations. Pt verbalizes understanding.  04/26/24 Supine, roll under knees and 2 pillows under head(hx of neck fusion) PWR! up with min/ mod facilitation followed by cane exercises for chest press and shoulder flexion, and shoulder abdcution for bilateral UE's min v.c and facillitation/ v.c for keeping shoulders down and back,  Seated pt practiced donning doffing jacket in seated x 2 with min-mod v.c for larger amplitude movements. PWR! hands basic 4 x 10 reps each min v.c Simulated feeding with foam grip on utensil, min v.c to slow down and rest spoon between bites.  Fastening/ unfastening buttons with adapted strategeis, mod v.c for techniques, increased time and demonstration required.   04/09/24:  Pt instructed in eating strategies and simulated/practiced as able with min cueing.  Pt also instructed in writing strategies and practiced with min cueing.  Pt wrote 2 short paragraphs (self generated) in cursive with approx 95% legibility and min decr in size.  Pt demo improvement with use of frequent stretching/PWR! Hands.  04/06/24-  Therapist reviewed hip prec with pt. (Avoiding bending at hip> 90, avoiding twisting hip/ foot inward and outward and avoiding crossing legs) to minimize risk of dislocation. Pt verbalized understanding. Supine, towel under low back, roll under knees and 2 pillows under head(hx of neck fusion) PWR! up with min/ mod facilitation followed by cane exercises for chest press and shoulder flexion, min v.c and facillitation/ v.c for keeping shoulders down and back, elbow extension followed by bilateral shoulder abduction, with cues  for shoulder and scapular depression, retraction. Standing PWR! rock and step, with min A for balance, pt was noted to lose balance several times. Pt was instructed not to try these standing exercises at home. Seated PWR! hands basic 4, min-mod v.c and demonstration. Stacking and manipulating coins for increased fine motor coordiantion, mod difficulty   04/02/24:  Pt instructed in  PWR! Hands (basic 4) and coordination HEP with focus on large amplitude movements and returned demo with min-mod cueing for positioning, to slow down, and large amplitude movements.    03/30/24- Supine, towel under low back, roll under knees and 2 pillows under head(hx of neck fusion) PWR! up with min/ mod facilitation followed by cane exercises for chest press and shoulder flexion, mod v.c and facillitation/ v.c for keeping shoulders down and back, elbow extension Seated PWR! up and PWR! rock 10 reps each, min v.c Seated at table PWR! hands basic 4, min-mod v.c Flipping and dealing playing cards with big movements, min-mod v.c and demonstration Reviewed safety for sit-stand from a chair, slowing down and avoiding twisting to protect hip.  03/25/24- eval    PATIENT EDUCATION: Education details:  see above for strategies and cueing Person educated: Patient Education method: Explanation, Demonstration, Tactile cues, Verbal cues,  Education comprehension: verbalized understanding, returned  demonstration, verbal cues required, and needs further education  HOME EXERCISE PROGRAM:  03/30/24-cane exercises in supine 04/02/24:  PWR! Hands, Coordination HEP with focus on large amplitude  04/09/24:  Strategies for eating and writing  GOALS: Goals reviewed with patient? Yes  SHORT TERM GOALS: Target date: 04/23/24   I with PD specific HEP Goal status: met, 04/29/24  2.  I with adapted strategies/AE to maximize safety and I with ADLs/IADLs. Goal status: ongoing  05/25/24  3.   Pt will demonstrate improved fine motor coordination for ADLs as evidenced by decreasing 9 hole peg test score for RUE to 2 mins or less without drops Goal status:partially met 81min48 secs  05/25/24  4.   Pt will demonstrate improved fine motor coordination for ADLs as evidenced by decreasing 9 hole peg test score for LUE by 3 secs Baseline: LUE 43.83 secs Goal status:  met, 37.10 05/11/24  5.  Pt will demonstrate improved ease with feeding as evidenced by decreasing PPT#2 to 11.34 without drops Baseline:  Goal status: ongoing 04/06/24, 05/11/24- ongoing.  05/21/24 ongoing 16.31sec without drops, 05/25/24-15.33 secs  6.  Pt will write a  sentence with 100% legibility and minimal decrease in letter size Goal status:  met 05/25/24 LONG TERM GOALS: Target date: 06/17/24  Pt will demonstrate improved ease with fastening buttons as evidenced by decreasing 3 button/ unbutton time to :1 min 20 secs or less Baseline: 1 min 29 secs Goal status: 05/21/24 MET 56.19sec  2.  Pt will demonstrate ability to retrieve a lightweight object at 125 shoulder flexion and -15 elbow extension with RUE Goal status: 05/21/24  Ongoing 120* with -20 elbow ext  3. Pt will demonstrate ability to retrieve a lightweight object at 130 shoulder flexion and -15 elbow extension with LUE Goal status: 05/21/24  Ongoing/partially met 145* with -20 elbow ext  4.  Pt will verbalize understanding of ways to prevent future PD related  complications and PD community resources. Goal status: INITIAL  5.   Improve PPT#4 to 22 secs or less for increased ease with dressing Baseline: 26.24 Goal status: ongoing  6.  Pt will report dropping utensils. and cups less frequently Goal status:  05/21/24  ongoing, at least 1x/day knocks things over or drops   ASSESSMENT:  CLINICAL IMPRESSION:  For the reporting period of:03/25/24-05/25/24. Pt achieved 3/6 short term goals. Pt has not fully met goals due to inconsistent scheduling due to family illness and pt out of town.Pt is progressing towards remaining goals. Pt can benefit from continued skilled occupational therapy to address the performance deficits below in order to maximize pt's ssafety and I with ADLs/IADLs.   PERFORMANCE DEFICITS: in functional skills including ADLs, IADLs, coordination, dexterity, ROM, strength, flexibility, Fine motor control, Gross motor control, mobility, balance, decreased knowledge of precautions, decreased knowledge of use of DME, and UE functional use, , and psychosocial skills including coping strategies, environmental adaptation, habits, interpersonal interactions, and routines and behaviors.   IMPAIRMENTS: are limiting patient from ADLs, IADLs, rest and sleep, play, leisure, and social participation.   COMORBIDITIES:  may have co-morbidities  that affects occupational performance. Patient will benefit from skilled OT to address above impairments and improve overall function.  MODIFICATION OR ASSISTANCE TO COMPLETE EVALUATION: No modification of tasks or assist necessary to complete an evaluation.  OT OCCUPATIONAL PROFILE AND HISTORY: Detailed assessment: Review of records and additional review of physical, cognitive, psychosocial history related to current functional performance.  CLINICAL DECISION MAKING: LOW - limited treatment options, no task modification necessary  REHAB POTENTIAL: Good  EVALUATION COMPLEXITY: Low   PLAN:  OT  FREQUENCY: 2x/week  OT DURATION: 12 weeks- anticipate d/c after 8 weeks  PLANNED INTERVENTIONS: 97168 OT Re-evaluation, 97535 self care/ADL training, 02889 therapeutic exercise, 97530 therapeutic activity, 97112 neuromuscular re-education, 97140 manual therapy, 97035 ultrasound, 97018 paraffin, 02989 moist heat, 97010 cryotherapy, 97760 Orthotic Initial, 97763 Orthotic/Prosthetic subsequent, passive range of motion, balance training, functional mobility training, energy conservation, coping strategies training, patient/family education, and DME and/or AE instructions  RECOMMENDED OTHER SERVICES: n/a  CONSULTED AND AGREED WITH PLAN OF CARE: Patient and family adult nurse- wife Rock  PLAN FOR NEXT SESSION:  work towards unmet STG's, continue with ADL strategies, functional reach  Hx of multiple hip dislocations, exercise caution with LE movement, twisting, no bending > 90*    Tranisha Tissue, OTR/L  05/25/2024, 11:07 AM

## 2024-05-25 ENCOUNTER — Ambulatory Visit: Admitting: Occupational Therapy

## 2024-05-25 DIAGNOSIS — M6281 Muscle weakness (generalized): Secondary | ICD-10-CM

## 2024-05-25 DIAGNOSIS — R2689 Other abnormalities of gait and mobility: Secondary | ICD-10-CM

## 2024-05-25 DIAGNOSIS — R278 Other lack of coordination: Secondary | ICD-10-CM

## 2024-05-25 DIAGNOSIS — R29818 Other symptoms and signs involving the nervous system: Secondary | ICD-10-CM

## 2024-05-25 DIAGNOSIS — R2681 Unsteadiness on feet: Secondary | ICD-10-CM

## 2024-05-25 DIAGNOSIS — M25621 Stiffness of right elbow, not elsewhere classified: Secondary | ICD-10-CM

## 2024-05-25 DIAGNOSIS — R293 Abnormal posture: Secondary | ICD-10-CM

## 2024-05-25 DIAGNOSIS — M25622 Stiffness of left elbow, not elsewhere classified: Secondary | ICD-10-CM

## 2024-05-31 ENCOUNTER — Ambulatory Visit: Admitting: Occupational Therapy

## 2024-05-31 DIAGNOSIS — M6281 Muscle weakness (generalized): Secondary | ICD-10-CM | POA: Diagnosis present

## 2024-05-31 DIAGNOSIS — R29818 Other symptoms and signs involving the nervous system: Secondary | ICD-10-CM | POA: Diagnosis present

## 2024-05-31 DIAGNOSIS — M25621 Stiffness of right elbow, not elsewhere classified: Secondary | ICD-10-CM | POA: Diagnosis present

## 2024-05-31 DIAGNOSIS — M25622 Stiffness of left elbow, not elsewhere classified: Secondary | ICD-10-CM | POA: Insufficient documentation

## 2024-05-31 DIAGNOSIS — R293 Abnormal posture: Secondary | ICD-10-CM | POA: Diagnosis present

## 2024-05-31 DIAGNOSIS — R278 Other lack of coordination: Secondary | ICD-10-CM | POA: Diagnosis present

## 2024-05-31 DIAGNOSIS — R2689 Other abnormalities of gait and mobility: Secondary | ICD-10-CM | POA: Diagnosis present

## 2024-05-31 DIAGNOSIS — R2681 Unsteadiness on feet: Secondary | ICD-10-CM | POA: Diagnosis present

## 2024-05-31 NOTE — Therapy (Signed)
 OUTPATIENT OCCUPATIONAL THERAPY PARKINSON'S TREATMENT  Patient Name: Jim Carson MRN: [id number] DOB:08-Oct-1950, 73 y.o., male Today's Date: 05/31/2024  PCP: Jim Slough, Carson REFERRING PROVIDER: Georgina Collar Carson  END OF SESSION:  OT End of Session - 05/31/24 1239     Visit Number 11    Number of Visits 25    Date for Recertification  06/17/24    Authorization Type Medicare, add KX    Authorization Time Period 12 weeks, anticipate d/c after 8 weeks    Authorization - Visit Number 11    OT Start Time 1235    OT Stop Time 1313    OT Time Calculation (min) 38 min    Activity Tolerance Patient tolerated treatment well    Behavior During Therapy WFL for tasks assessed/performed                  Past Medical History:  Diagnosis Date   Abnormal gait    due to parkinson's,  uses cane   Absolute anemia 03/13/2023   Allergic rhinitis    Allergy Spring 1998   Allergic to dustmits, dog hair and roaches.   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   GERD (gastroesophageal reflux disease)    History of multiple concussions    per pt as teen playing football, no residual   Hypothyroidism    followed by pcp   Left shoulder pain    Neuromuscular disorder (HCC) 07/04/2012   Parkinson's Disease   OA (osteoarthritis)    OSA on CPAP    Parkinson disease [facility])    neurologist-- dr Jim Carson  @[facility]   Pneumonia    Sleep apnea    Vitamin B12 deficiency    Vitamin D  deficiency    Wears glasses    Past Surgical History:  Procedure Laterality Date   APPENDECTOMY  07/1964   APPENDECTOMY     CHOLECYSTECTOMY     JOINT REPLACEMENT  March 2019 and again on 09/10/2021   Left hip twice.  2nd time due to 3 dislocations   LAPAROSCOPIC CHOLECYSTECTOMY  01-21-2017   @[facility] , [place]   lower back surgery      POSTERIOR CERVICAL  FUSION/FORAMINOTOMY     POSTERIOR FUSION CERVICAL SPINE  08-15-2011   @[facility] , [place]   w/ laminectomy and decompression-- C3 -- T1   POSTERIOR LAMINECTOMY / DECOMPRESSION LUMBAR SPINE  07-30-2013   @[facility] , [place]   bilateral T11 - 12,  L2 --5   REMOVAL LEFT T1 PARASPINOUS MASS  02-12-2016   @[facility] ,  [place]   desmoid fibromatosis   SHOULDER ARTHROSCOPY WITH ROTATOR CUFF REPAIR Left 12/15/2018   Procedure: SHOULDER ARTHROSCOPY, biceps tenodesis labored Debridement, submacromial decompression;  Surgeon: Jim Lamar, Carson;  Location: [facility];  Service: Orthopedics;  Laterality: Left;  interscalene block   SPINE SURGERY     TOTAL HIP ARTHROPLASTY Left 09/18/2017   @[facility] , [place]   TOTAL HIP REVISION Left 09/10/2021   Procedure: Left hip acetabular versus total hip arthroplasty revision, constrained liner;  Surgeon: Jim Lerner, Carson;  Location: [facility];  Service: Orthopedics;  Laterality: Left;   TOTAL HIP REVISION Left 10/01/2023   Procedure: Left hip constrained liner revision;  Surgeon: Jim Lerner, Carson;  Location: [facility];  Service: Orthopedics;  Laterality: Left;    Patient Active  Problem List   Diagnosis Date Noted   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   Thrombocytopenia 03/17/2023   Absolute anemia 03/13/2023   Family history of prostate cancer 03/13/2023   History of elevated PSA 03/13/2023   AV block, 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   Drug induced constipation 12/30/2022   Acquired hypothyroidism 12/30/2022   Primary osteoarthritis of left hip 05/18/2021   PD (Parkinson's disease) (HCC) 05/18/2021   History of gastroesophageal reflux (GERD) 05/18/2021   OSA (obstructive sleep apnea) 05/18/2021   Essential hypertension 05/18/2021    ONSET DATE: referral 12/09/23  REFERRING DIAG:  Diagnosis  G20.B2 (ICD-10-CM) - Parkinson's disease with dyskinesia, with fluctuations    THERAPY DIAG:  Other symptoms  and signs involving the nervous system  Other lack of coordination  Stiffness of left elbow, not elsewhere classified  Stiffness of right elbow, not elsewhere classified  Muscle weakness (generalized)  Other abnormalities of gait and mobility  Abnormal posture  Rationale for Evaluation and Treatment: Rehabilitation  SUBJECTIVE:   SUBJECTIVE STATEMENT: Pt report he had a good Thanksgiving  Goes by Jim Carson  PERTINENT HISTORY: Pt reports he had a L hip replacement in 2019 and dislocated it 3 times. Re did it 2023, had a fall and jammed it and had to revise it 10/01/23. Was told not to go too heavy over 110 lbs on the leg press. Was told not to cross his legs  PMH: Parkinson's Disease, hypothyroidism, hx of multiple concussions, GERD, benign essential HTN, abnormal gait, L THA 2019, removal of T1 paraspinous mass 2017, T11-12 and L2-5 lumbar lami/decompression 2015, cervical fusion C3-T1 2013, hx of shoulder surgery June 2020, hip surgery revision 10/01/23 after multiple dislocations  PRECAUTIONS:follow hip precautions avoid bending greater than 90, avoid rotating hip/ foot inwards/ outwards, do not cross legs Other: hip revision, told not to cross legs  WEIGHT BEARING RESTRICTIONS: No  PAIN:  Are you having pain? Yes: NPRS scale: 0-2/10 Pain location: hip, wrist Pain description: aching Aggravating factors: wrist extension Relieving factors: rest  FALLS: Has patient fallen in last 6 months? Yes. Number of falls 3, Pt saw PT to address  LIVING ENVIRONMENT: Lives with: lives with their family and lives with their spouse Lives in: House/apartment  Has following equipment at home: Environmental Consultant - 4 wheeled and shower chair  PLOF: Independent with basic ADLs  PATIENT GOALS: improve grip and coordination, wants to play guitar better  OBJECTIVE:  Note: Objective measures were completed at Evaluation unless otherwise noted.  HAND DOMINANCE: Left  ADLs:  Overall ADLs: increased time  required Transfers/ambulation related to ADLs: Eating: difficulty holding knife to cut food  Grooming: mod I UB Dressing:needs assist with buttons at times LB Dressing: min A at times with socks Toileting: mod  Bathing: mod I Tub Shower transfers: mod I, walk in shower with seat Equipment: Shower seat with back and Walk in shower Has an up walker at home IADLs:  Light housekeeping: Pt does laundry, and cleans up after meals  Meal Prep: wife prepares Community mobility: mod I with rollator Medication management: keeps up with meds Financial management: keeps up with finanaces Handwriting: 50% legible and Moderate micrographia  MOBILITY STATUS: Independent  POSTURE COMMENTS:  rounded shoulders, forward head, and flexed trunk    FUNCTIONAL OUTCOME MEASURES: Fastening/unfastening 3 buttons: 1 min 29 secs Physical performance test: PPT#2 (simulated eating) 14.34 with 1 drop & PPT#4 (donning/doffing jacket): NT  COORDINATION: 9 Hole Peg test: Right: 2  mins.06 sec multiple drops; Left: 43.83 sec Box and Blocks:  Right 40 blocks, Left 47blocks  UE ROM:  RUE shoulder flexion 120, elbow extension -20, LUE shoulder flexion 130, elbow extension -20, decreased bilateral grip/ composite flexion due to arthritis  COGNITION: Overall cognitive status: Within functional limits for tasks assessed  OBSERVATIONS: Bradykinesia                                                                                                                    TREATMENT DATE: 05/31/24 UBE x 6 mins level 1 for conditioning min v.c to maintain 40 rpm Standing to perform PWR! rock and step  while flipping playing cards at countertop, marching min guard for balance, min v.c for amplitude Seated PWR! up without rocking forwards, then closed chain shoulder flexion with cane, chest press and abduction,  min v.c Standing with back against wall hands on rollator in front then with min A from therapist standing with palms  against wall for PWR! up while squeezing shoulder blades back, then arms in abduction on wall with min A, v.c and facilitation for upright posture Pt's posture was improved at end of session. Discussion with pt regarding safety for turns and sit to stand as pt moves very quickly and is at risk for falls   05/25/24-Therapist checked progress in prep for progress note, see short term goals below. Handwriting activity for continuous l, continuouse followed by writing activity, pt was able to write a sentence with 100% legibility and minimal decrease in letter size. PWR! hands for PWR! up prior to task discussed importance of avoiding twisting at hips to avoid hip dislocation.  Standing to perform PWR! rock and step at countertop, marching min guard for balance, min v.c for amplitude Seated PWR! up without rocking forwards, then closed chain shoulder flexion with cane, min v.c discussed avoiding biceps curls at the gym due to likelihood of making elbows tight and difficulty with elbow extension UBE x 5 mins level 1 for conditioning  05/21/24:  Closed-chain shoulder flex in sitting with BUEs with foam noodle and min cueing for posture and elbow ext followed by diagonals to each side with gentle wt. Shift and trunk rotation with min cueing.  PWR! Hands x10 with min cueing for large amplitude movements.  Checked shoulder/elbow ROM for functional reach (see goals section below).  Practiced functional reaching with each UE to grasp/release cylinder objects incorporating reaching laterally and across body with min cueing to slow down, large amplitude,  and track hand/object with eyes to decr drops/knocking items over.  Reviewed strategies for fastening/unfastening buttons and practiced with min v.c. (also checked goal--see below).    Practiced writing with min cueing for large amplitude.  Pt wrote 8 sentences with approx 95% legibility and min decr in size.    Checked PPT#2 (see below) with no drops,  min cueing for grasp on utensils.    05/11/24 Pt missed therapy last week due to pt's wife's illness.  supine on mat with hotpack underback,  2 pillows under head and roll under knees PWR! up with min/ mod facilitation followed by cane exercises for chest press and shoulder flexion, bilateral shoulder abduction- then clapping hands together over chest, no twist due to hx of hip dislocation, no adverse reactions to heat x 15 mins, pain decreased Seated, PWR up for arms without leaning forward then reaching to side unilaterally with right and left UE's, mod v.c Standing a t countertop, stepping to left na dright sides while holding onto ocuntetop then PWR! rock , then stepping while reaching for targets on cabinets, min-mod v.c for amplitude and posture, close supervision for balance PWR! hands for PWR! up, twist and step. Therapist reviewed dealing cards with thumb for left and right hands. Pt was encouraged to perform coordination activities at  home. Therapist checked bilateral 9 hole peg test, see goals below.   04/29/24- Standing against wall for stretch in upright posture min-mod v.c PWR! hands for PWR! up rock and twist , 10 reps each min-mod v.c Flipping playing cards with big movement, then dealing playing cards with big movments, picking up and stacking coins, min-mod v.c and demonstration Fine motor coordination task to place grooved pegs into pegboard with LUE with in hand manipulation, min-mod difficulty/ v.c then removing with LUE with min-mod difficulty and in hand manipulation. Pt practiced upright posture while walking to waiting room, min v.c Therapist recommends pt performs cane exercises on bed not floor due to risk for falls and hip dislocations. Pt verbalizes understanding.  04/26/24 Supine, roll under knees and 2 pillows under head(hx of neck fusion) PWR! up with min/ mod facilitation followed by cane exercises for chest press and shoulder flexion, and shoulder abdcution for  bilateral UE's min v.c and facillitation/ v.c for keeping shoulders down and back,  Seated pt practiced donning doffing jacket in seated x 2 with min-mod v.c for larger amplitude movements. PWR! hands basic 4 x 10 reps each min v.c Simulated feeding with foam grip on utensil, min v.c to slow down and rest spoon between bites.  Fastening/ unfastening buttons with adapted strategeis, mod v.c for techniques, increased time and demonstration required.   04/09/24:  Pt instructed in eating strategies and simulated/practiced as able with min cueing.  Pt also instructed in writing strategies and practiced with min cueing.  Pt wrote 2 short paragraphs (self generated) in cursive with approx 95% legibility and min decr in size.  Pt demo improvement with use of frequent stretching/PWR! Hands.  04/06/24- Therapist reviewed hip prec with pt. (Avoiding bending at hip> 90, avoiding twisting hip/ foot inward and outward and avoiding crossing legs) to minimize risk of dislocation. Pt verbalized understanding. Supine, towel under low back, roll under knees and 2 pillows under head(hx of neck fusion) PWR! up with min/ mod facilitation followed by cane exercises for chest press and shoulder flexion, min v.c and facillitation/ v.c for keeping shoulders down and back, elbow extension followed by bilateral shoulder abduction, with cues  for shoulder and scapular depression, retraction. Standing PWR! rock and step, with min A for balance, pt was noted to lose balance several times. Pt was instructed not to try these standing exercises at home. Seated PWR! hands basic 4, min-mod v.c and demonstration. Stacking and manipulating coins for increased fine motor coordiantion, mod difficulty   04/02/24:  Pt instructed in  PWR! Hands (basic 4) and coordination HEP with focus on large amplitude movements and returned demo with min-mod cueing for positioning, to slow down, and large amplitude movements.  03/30/24- Supine, towel under  low back, roll under knees and 2 pillows under head(hx of neck fusion) PWR! up with min/ mod facilitation followed by cane exercises for chest press and shoulder flexion, mod v.c and facillitation/ v.c for keeping shoulders down and back, elbow extension Seated PWR! up and PWR! rock 10 reps each, min v.c Seated at table PWR! hands basic 4, min-mod v.c Flipping and dealing playing cards with big movements, min-mod v.c and demonstration Reviewed safety for sit-stand from a chair, slowing down and avoiding twisting to protect hip.  03/25/24- eval    PATIENT EDUCATION: Education details:  see above for strategies and cueing Person educated: Patient Education method: Explanation, Demonstration, Tactile cues, Verbal cues,  Education comprehension: verbalized understanding, returned demonstration, verbal cues required, and needs further education   HOME EXERCISE PROGRAM:  03/30/24-cane exercises in supine 04/02/24:  PWR! Hands, Coordination HEP with focus on large amplitude  04/09/24:  Strategies for eating and writing  GOALS: Goals reviewed with patient? Yes  SHORT TERM GOALS: Target date: 04/23/24   I with PD specific HEP Goal status: met, 04/29/24  2.  I with adapted strategies/AE to maximize safety and I with ADLs/IADLs. Goal status: ongoing  05/31/24  3.   Pt will demonstrate improved fine motor coordination for ADLs as evidenced by decreasing 9 hole peg test score for RUE to 2 mins or less without drops Goal status:partially met 68min48 secs  05/25/24  4.   Pt will demonstrate improved fine motor coordination for ADLs as evidenced by decreasing 9 hole peg test score for LUE by 3 secs Baseline: LUE 43.83 secs Goal status:  met, 37.10 05/11/24  5.  Pt will demonstrate improved ease with feeding as evidenced by decreasing PPT#2 to 11.34 without drops Baseline:  Goal status: ongoing 04/06/24, 05/11/24- ongoing.  05/21/24 ongoing 16.31sec without drops, 05/25/24-15.33 secs  6.  Pt will  write a  sentence with 100% legibility and minimal decrease in letter size Goal status:  met 05/25/24 LONG TERM GOALS: Target date: 06/17/24  Pt will demonstrate improved ease with fastening buttons as evidenced by decreasing 3 button/ unbutton time to :1 min 20 secs or less Baseline: 1 min 29 secs Goal status: 05/21/24 MET 56.19sec  2.  Pt will demonstrate ability to retrieve a lightweight object at 125 shoulder flexion and -15 elbow extension with RUE Goal status: 05/21/24  Ongoing 120* with -20 elbow ext  3. Pt will demonstrate ability to retrieve a lightweight object at 130 shoulder flexion and -15 elbow extension with LUE Goal status: 05/21/24  Ongoing/partially met 145* with -20 elbow ext  4.  Pt will verbalize understanding of ways to prevent future PD related complications and PD community resources. Goal status:  ongoing 05/31/24  5.   Improve PPT#4 to 22 secs or less for increased ease with dressing Baseline: 26.24 Goal status: ongoing 05/31/24  6.  Pt will report dropping utensils. and cups less frequently Goal status:  05/21/24  ongoing, at least 1x/day knocks things over or drops   ASSESSMENT:  CLINICAL IMPRESSION: Pt is progressing towards goals. He demonstrates improved upright posture at end of session and improved mobility as a result.   PERFORMANCE DEFICITS: in functional skills including ADLs, IADLs, coordination, dexterity, ROM, strength, flexibility, Fine motor control, Gross motor control, mobility, balance, decreased knowledge of precautions, decreased knowledge of use of DME, and UE functional use, , and psychosocial skills including coping strategies, environmental adaptation, habits, interpersonal interactions, and routines and behaviors.  IMPAIRMENTS: are limiting patient from ADLs, IADLs, rest and sleep, play, leisure, and social participation.   COMORBIDITIES:  may have co-morbidities  that affects occupational performance. Patient will benefit from  skilled OT to address above impairments and improve overall function.  MODIFICATION OR ASSISTANCE TO COMPLETE EVALUATION: No modification of tasks or assist necessary to complete an evaluation.  OT OCCUPATIONAL PROFILE AND HISTORY: Detailed assessment: Review of records and additional review of physical, cognitive, psychosocial history related to current functional performance.  CLINICAL DECISION MAKING: LOW - limited treatment options, no task modification necessary  REHAB POTENTIAL: Good  EVALUATION COMPLEXITY: Low   PLAN:  OT FREQUENCY: 2x/week  OT DURATION: 12 weeks- anticipate d/c after 8 weeks  PLANNED INTERVENTIONS: 97168 OT Re-evaluation, 97535 self care/ADL training, 02889 therapeutic exercise, 97530 therapeutic activity, 97112 neuromuscular re-education, 97140 manual therapy, 97035 ultrasound, 97018 paraffin, 02989 moist heat, 97010 cryotherapy, 97760 Orthotic Initial, 97763 Orthotic/Prosthetic subsequent, passive range of motion, balance training, functional mobility training, energy conservation, coping strategies training, patient/family education, and DME and/or AE instructions  RECOMMENDED OTHER SERVICES: n/a  CONSULTED AND AGREED WITH PLAN OF CARE: Patient and family adult nurse- wife Rock GORY FOR NEXT SESSION:  donning/ doffing jacket, continue with ADL strategies, functional reach  Hx of multiple hip dislocations, exercise caution with LE movement, twisting, no bending > 90*    Claudell Rhody, OTR/L  05/31/2024, 12:40 PM

## 2024-06-03 ENCOUNTER — Ambulatory Visit: Admitting: Occupational Therapy

## 2024-06-03 ENCOUNTER — Encounter: Payer: Self-pay | Admitting: Occupational Therapy

## 2024-06-03 DIAGNOSIS — M6281 Muscle weakness (generalized): Secondary | ICD-10-CM

## 2024-06-03 DIAGNOSIS — R2681 Unsteadiness on feet: Secondary | ICD-10-CM

## 2024-06-03 DIAGNOSIS — M25622 Stiffness of left elbow, not elsewhere classified: Secondary | ICD-10-CM

## 2024-06-03 DIAGNOSIS — R29818 Other symptoms and signs involving the nervous system: Secondary | ICD-10-CM

## 2024-06-03 DIAGNOSIS — R293 Abnormal posture: Secondary | ICD-10-CM

## 2024-06-03 DIAGNOSIS — R278 Other lack of coordination: Secondary | ICD-10-CM

## 2024-06-03 DIAGNOSIS — M25621 Stiffness of right elbow, not elsewhere classified: Secondary | ICD-10-CM

## 2024-06-03 NOTE — Therapy (Addendum)
 OUTPATIENT OCCUPATIONAL THERAPY PARKINSON'S TREATMENT  Patient Name: Jim Carson MRN: [id number] DOB:[birth year], 73 y.o., male Today's Date: 06/03/2024  PCP: Abran Slough, MD REFERRING PROVIDER: Georgina Collar MD  END OF SESSION:  OT End of Session - 06/03/24 1244     Visit Number 12    Number of Visits 25    Date for Recertification  06/17/24    Authorization Type Medicare, add KX    Authorization Time Period 12 weeks, anticipate d/c after 8 weeks    Authorization - Visit Number 12    Progress Note Due on Visit 20    OT Start Time 1240   pt arrived late   OT Stop Time 1315    OT Time Calculation (min) 35 min                   Past Medical History:  Diagnosis Date   Abnormal gait    due to parkinson's,  uses cane   Absolute anemia 03/13/2023   Allergic rhinitis    Allergy Spring 1998   Allergic to dustmits, dog hair and roaches.   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 glendia  @[facility]   Pneumonia    Sleep apnea    Vitamin B12 deficiency    Vitamin D  deficiency    Wears glasses    Past Surgical History:  Procedure Laterality Date   APPENDECTOMY  07/1964   APPENDECTOMY     CHOLECYSTECTOMY     JOINT REPLACEMENT  March 2019 and again on 09/10/2021   Left hip twice.  2nd time due to 3 dislocations   LAPAROSCOPIC CHOLECYSTECTOMY  01-21-2017   @[facility] , [place]   lower back surgery      POSTERIOR CERVICAL FUSION/FORAMINOTOMY     POSTERIOR FUSION CERVICAL SPINE  08-15-2011    @[facility] , [place]   w/ laminectomy and decompression-- C3 -- T1   POSTERIOR LAMINECTOMY / DECOMPRESSION LUMBAR SPINE  07-30-2013   @[facility] , [place]   bilateral T11 - 12,  L2 --5   REMOVAL LEFT T1 PARASPINOUS MASS  02-12-2016   @[facility] ,  [place]   desmoid fibromatosis   SHOULDER ARTHROSCOPY WITH ROTATOR CUFF REPAIR Left 12/15/2018   Procedure: SHOULDER ARTHROSCOPY, biceps tenodesis labored Debridement, submacromial decompression;  Surgeon: Gerome Lamar, MD;  Location: [facility];  Service: Orthopedics;  Laterality: Left;  interscalene block   SPINE SURGERY     TOTAL HIP ARTHROPLASTY Left 09/18/2017   @[facility] , [place]   TOTAL HIP REVISION Left 09/10/2021   Procedure: Left hip acetabular versus total hip arthroplasty revision, constrained liner;  Surgeon: Melodi Lerner, MD;  Location: [facility];  Service: Orthopedics;  Laterality: Left;   TOTAL HIP REVISION Left 10/01/2023   Procedure: Left hip constrained liner revision;  Surgeon: Melodi Lerner, MD;  Location: [facility];  Service: Orthopedics;  Laterality: Left;    Patient Active Problem List   Diagnosis  Date Noted   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   Thrombocytopenia 03/17/2023   Absolute anemia 03/13/2023   Family history of prostate cancer 03/13/2023   History of elevated PSA 03/13/2023   AV block, 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   Drug induced constipation 12/30/2022   Acquired hypothyroidism 12/30/2022   Primary osteoarthritis of left hip 05/18/2021   PD (Parkinson's disease) (HCC) 05/18/2021   History of gastroesophageal reflux (GERD) 05/18/2021   OSA (obstructive sleep apnea) 05/18/2021   Essential hypertension 05/18/2021    ONSET DATE: referral 12/09/23  REFERRING DIAG:  Diagnosis  G20.B2 (ICD-10-CM) - Parkinson's disease with dyskinesia, with fluctuations    THERAPY DIAG:  Other symptoms and signs involving the nervous system  Other lack of  coordination  Stiffness of left elbow, not elsewhere classified  Stiffness of right elbow, not elsewhere classified  Muscle weakness (generalized)  Abnormal posture  Unsteadiness on feet  Rationale for Evaluation and Treatment: Rehabilitation  SUBJECTIVE:   SUBJECTIVE STATEMENT: Pt  reports only mild pain on arrival  Goes by Jim Carson  PERTINENT HISTORY: Pt reports he had a L hip replacement in 2019 and dislocated it 3 times. Re did it 2023, had a fall and jammed it and had to revise it 10/01/23. Was told not to go too heavy over 110 lbs on the leg press. Was told not to cross his legs  PMH: Parkinson's Disease, hypothyroidism, hx of multiple concussions, GERD, benign essential HTN, abnormal gait, L THA 2019, removal of T1 paraspinous mass 2017, T11-12 and L2-5 lumbar lami/decompression 2015, cervical fusion C3-T1 2013, hx of shoulder surgery June 2020, hip surgery revision 10/01/23 after multiple dislocations  PRECAUTIONS:follow hip precautions avoid bending greater than 90, avoid rotating hip/ foot inwards/ outwards, do not cross legs Other: hip revision, told not to cross legs  WEIGHT BEARING RESTRICTIONS: No  PAIN:  Are you having pain? Yes: NPRS scale: 0-2/10 Pain location: hip, wrist Pain description: aching Aggravating factors: wrist extension Relieving factors: rest  FALLS: Has patient fallen in last 6 months? Yes. Number of falls 3, Pt saw PT to address  LIVING ENVIRONMENT: Lives with: lives with their family and lives with their spouse Lives in: House/apartment  Has following equipment at home: Environmental Consultant - 4 wheeled and shower chair  PLOF: Independent with basic ADLs  PATIENT GOALS: improve grip and coordination, wants to play guitar better  OBJECTIVE:  Note: Objective measures were completed at Evaluation unless otherwise noted.  HAND DOMINANCE: Left  ADLs:  Overall ADLs: increased time required Transfers/ambulation related to ADLs: Eating: difficulty holding  knife to cut food  Grooming: mod I UB Dressing:needs assist with buttons at times LB Dressing: min A at times with socks Toileting: mod  Bathing: mod I Tub Shower transfers: mod I, walk in shower with seat Equipment: Shower seat with back and Walk in shower Has an up walker at home IADLs:  Light housekeeping: Pt does laundry, and cleans up after meals  Meal Prep: wife prepares Community mobility: mod I with rollator Medication management: keeps up with meds Financial management: keeps up with finanaces Handwriting: 50% legible and Moderate micrographia  MOBILITY STATUS: Independent  POSTURE COMMENTS:  rounded shoulders, forward head, and flexed trunk    FUNCTIONAL OUTCOME MEASURES: Fastening/unfastening 3 buttons: 1 min 29 secs Physical performance test: PPT#2 (simulated eating) 14.34 with 1 drop & PPT#4 (donning/doffing jacket): NT  COORDINATION: 9 Hole Peg test: Right: 2 mins.06 sec multiple drops; Left: 43.83 sec  Box and Blocks:  Right 40 blocks, Left 47blocks  UE ROM:  RUE shoulder flexion 120, elbow extension -20, LUE shoulder flexion 130, elbow extension -20, decreased bilateral grip/ composite flexion due to arthritis  COGNITION: Overall cognitive status: Within functional limits for tasks assessed  OBSERVATIONS: Bradykinesia                                                                                                                    TREATMENT DATE:06/03/24- UBE x 5 mins level 1 for conditioning  Standing with back against wall hands on rollator in front then with min A from therapist standing with palms against wall for PWR! up while squeezing shoulder blades back, then arms in abduction on wall with min A, v.c and facilitation for upright posture PWR! rock at [employer], modified quadraped at [employer] 10-20 reps each Sit to stand with emphasis on calibration, PWR! up for sit to stand, and focus on controlled sit Naviagiting gym with big steps and upright  posture, mod v.c   05/31/24 UBE x 6 mins level 1 for conditioning min v.c to maintain 40 rpm Standing to perform PWR! rock and step  while flipping playing cards at countertop, marching min guard for balance, min v.c for amplitude Seated PWR! up without rocking forwards, then closed chain shoulder flexion with cane, chest press and abduction,  min v.c Standing with back against wall hands on rollator in front then with min A from therapist standing with palms against wall for PWR! up while squeezing shoulder blades back, then arms in abduction on wall with min A, v.c and facilitation for upright posture Pt's posture was improved at end of session. Discussion with pt regarding safety for turns and sit to stand as pt moves very quickly and is at risk for falls   05/25/24-Therapist checked progress in prep for progress note, see short term goals below. Handwriting activity for continuous l, continuouse followed by writing activity, pt was able to write a sentence with 100% legibility and minimal decrease in letter size. PWR! hands for PWR! up prior to task discussed importance of avoiding twisting at hips to avoid hip dislocation.  Standing to perform PWR! rock and step at countertop, marching min guard for balance, min v.c for amplitude Seated PWR! up without rocking forwards, then closed chain shoulder flexion with cane, min v.c discussed avoiding biceps curls at the gym due to likelihood of making elbows tight and difficulty with elbow extension UBE x 5 mins level 1 for conditioning  05/21/24:  Closed-chain shoulder flex in sitting with BUEs with foam noodle and min cueing for posture and elbow ext followed by diagonals to each side with gentle wt. Shift and trunk rotation with min cueing.  PWR! Hands x10 with min cueing for large amplitude movements.  Checked shoulder/elbow ROM for functional reach (see goals section below).  Practiced functional reaching with each UE to grasp/release  cylinder objects incorporating reaching laterally and across body with min cueing to slow down, large amplitude,  and track hand/object with eyes to decr drops/knocking items over.  Reviewed strategies for fastening/unfastening buttons and practiced with min v.c. (also checked goal--see below).    Practiced writing with min cueing for large amplitude.  Pt wrote 8 sentences with approx 95% legibility and min decr in size.    Checked PPT#2 (see below) with no drops, min cueing for grasp on utensils.    05/11/24 Pt missed therapy last week due to pt's wife's illness.  supine on mat with hotpack underback, 2 pillows under head and roll under knees PWR! up with min/ mod facilitation followed by cane exercises for chest press and shoulder flexion, bilateral shoulder abduction- then clapping hands together over chest, no twist due to hx of hip dislocation, no adverse reactions to heat x 15 mins, pain decreased Seated, PWR up for arms without leaning forward then reaching to side unilaterally with right and left UE's, mod v.c Standing a t countertop, stepping to left na dright sides while holding onto ocuntetop then PWR! rock , then stepping while reaching for targets on cabinets, min-mod v.c for amplitude and posture, close supervision for balance PWR! hands for PWR! up, twist and step. Therapist reviewed dealing cards with thumb for left and right hands. Pt was encouraged to perform coordination activities at  home. Therapist checked bilateral 9 hole peg test, see goals below.   04/29/24- Standing against wall for stretch in upright posture min-mod v.c PWR! hands for PWR! up rock and twist , 10 reps each min-mod v.c Flipping playing cards with big movement, then dealing playing cards with big movments, picking up and stacking coins, min-mod v.c and demonstration Fine motor coordination task to place grooved pegs into pegboard with LUE with in hand manipulation, min-mod difficulty/ v.c then removing  with LUE with min-mod difficulty and in hand manipulation. Pt practiced upright posture while walking to waiting room, min v.c Therapist recommends pt performs cane exercises on bed not floor due to risk for falls and hip dislocations. Pt verbalizes understanding.  04/26/24 Supine, roll under knees and 2 pillows under head(hx of neck fusion) PWR! up with min/ mod facilitation followed by cane exercises for chest press and shoulder flexion, and shoulder abdcution for bilateral UE's min v.c and facillitation/ v.c for keeping shoulders down and back,  Seated pt practiced donning doffing jacket in seated x 2 with min-mod v.c for larger amplitude movements. PWR! hands basic 4 x 10 reps each min v.c Simulated feeding with foam grip on utensil, min v.c to slow down and rest spoon between bites.  Fastening/ unfastening buttons with adapted strategeis, mod v.c for techniques, increased time and demonstration required.   04/09/24:  Pt instructed in eating strategies and simulated/practiced as able with min cueing.  Pt also instructed in writing strategies and practiced with min cueing.  Pt wrote 2 short paragraphs (self generated) in cursive with approx 95% legibility and min decr in size.  Pt demo improvement with use of frequent stretching/PWR! Hands.  04/06/24- Therapist reviewed hip prec with pt. (Avoiding bending at hip> 90, avoiding twisting hip/ foot inward and outward and avoiding crossing legs) to minimize risk of dislocation. Pt verbalized understanding. Supine, towel under low back, roll under knees and 2 pillows under head(hx of neck fusion) PWR! up with min/ mod facilitation followed by cane exercises for chest press and shoulder flexion, min v.c and facillitation/ v.c for keeping shoulders down and back, elbow extension followed by bilateral shoulder abduction, with cues  for shoulder and scapular depression,  retraction. Standing PWR! rock and step, with min A for balance, pt was noted to lose  balance several times. Pt was instructed not to try these standing exercises at home. Seated PWR! hands basic 4, min-mod v.c and demonstration. Stacking and manipulating coins for increased fine motor coordiantion, mod difficulty   04/02/24:  Pt instructed in  PWR! Hands (basic 4) and coordination HEP with focus on large amplitude movements and returned demo with min-mod cueing for positioning, to slow down, and large amplitude movements.    03/30/24- Supine, towel under low back, roll under knees and 2 pillows under head(hx of neck fusion) PWR! up with min/ mod facilitation followed by cane exercises for chest press and shoulder flexion, mod v.c and facillitation/ v.c for keeping shoulders down and back, elbow extension Seated PWR! up and PWR! rock 10 reps each, min v.c Seated at table PWR! hands basic 4, min-mod v.c Flipping and dealing playing cards with big movements, min-mod v.c and demonstration Reviewed safety for sit-stand from a chair, slowing down and avoiding twisting to protect hip.  03/25/24- eval    PATIENT EDUCATION: Education details:  see above for strategies and cueing Person educated: Patient Education method: Explanation, Demonstration, Tactile cues, Verbal cues,  Education comprehension: verbalized understanding, returned demonstration, verbal cues required, and needs further education   HOME EXERCISE PROGRAM:  03/30/24-cane exercises in supine 04/02/24:  PWR! Hands, Coordination HEP with focus on large amplitude  04/09/24:  Strategies for eating and writing  GOALS: Goals reviewed with patient? Yes  SHORT TERM GOALS: Target date: 04/23/24   I with PD specific HEP Goal status: met, 04/29/24  2.  I with adapted strategies/AE to maximize safety and I with ADLs/IADLs. Goal status: ongoing  05/31/24  3.   Pt will demonstrate improved fine motor coordination for ADLs as evidenced by decreasing 9 hole peg test score for RUE to 2 mins or less without drops Goal  status:partially met 48 secs with drops  05/25/24  4.   Pt will demonstrate improved fine motor coordination for ADLs as evidenced by decreasing 9 hole peg test score for LUE by 3 secs Baseline: LUE 43.83 secs Goal status:  met, 37.10 05/11/24  5.  Pt will demonstrate improved ease with feeding as evidenced by decreasing PPT#2 to 11.34 without drops Baseline:  Goal status: ongoing 04/06/24, 05/11/24- ongoing.  05/21/24 ongoing 16.31sec without drops, 05/25/24-15.33 secs  6.  Pt will write a  sentence with 100% legibility and minimal decrease in letter size Goal status:  met 05/25/24 LONG TERM GOALS: Target date: 06/17/24  Pt will demonstrate improved ease with fastening buttons as evidenced by decreasing 3 button/ unbutton time to :1 min 20 secs or less Baseline: 1 min 29 secs Goal status: 05/21/24 MET 56.19sec  2.  Pt will demonstrate ability to retrieve a lightweight object at 125 shoulder flexion and -15 elbow extension with RUE Goal status: 05/21/24  Ongoing 120* with -20 elbow ext  3. Pt will demonstrate ability to retrieve a lightweight object at 130 shoulder flexion and -15 elbow extension with LUE Goal status: 05/21/24  Ongoing/partially met 145* with -20 elbow ext  4.  Pt will verbalize understanding of ways to prevent future PD related complications and PD community resources. Goal status:  ongoing 05/31/24  5.   Improve PPT#4 to 22 secs or less for increased ease with dressing Baseline: 26.24 Goal status: ongoing 05/31/24  6.  Pt will report dropping utensils. and cups less frequently Goal status:  05/21/24  ongoing, at least 1x/day knocks things over or drops   ASSESSMENT:  CLINICAL IMPRESSION: Pt is progressing towards goals. He demonstrates improved upright posture. Pt verbalizes understanding of daily stretching for improved posture.  PERFORMANCE DEFICITS: in functional skills including ADLs, IADLs, coordination, dexterity, ROM, strength, flexibility, Fine  motor control, Gross motor control, mobility, balance, decreased knowledge of precautions, decreased knowledge of use of DME, and UE functional use, , and psychosocial skills including coping strategies, environmental adaptation, habits, interpersonal interactions, and routines and behaviors.   IMPAIRMENTS: are limiting patient from ADLs, IADLs, rest and sleep, play, leisure, and social participation.   COMORBIDITIES:  may have co-morbidities  that affects occupational performance. Patient will benefit from skilled OT to address above impairments and improve overall function.  MODIFICATION OR ASSISTANCE TO COMPLETE EVALUATION: No modification of tasks or assist necessary to complete an evaluation.  OT OCCUPATIONAL PROFILE AND HISTORY: Detailed assessment: Review of records and additional review of physical, cognitive, psychosocial history related to current functional performance.  CLINICAL DECISION MAKING: LOW - limited treatment options, no task modification necessary  REHAB POTENTIAL: Good  EVALUATION COMPLEXITY: Low   PLAN:  OT FREQUENCY: 2x/week  OT DURATION: 12 weeks- anticipate d/c after 8 weeks  PLANNED INTERVENTIONS: 97168 OT Re-evaluation, 97535 self care/ADL training, 02889 therapeutic exercise, 97530 therapeutic activity, 97112 neuromuscular re-education, 97140 manual therapy, 97035 ultrasound, 97018 paraffin, 02989 moist heat, 97010 cryotherapy, 97760 Orthotic Initial, 97763 Orthotic/Prosthetic subsequent, passive range of motion, balance training, functional mobility training, energy conservation, coping strategies training, patient/family education, and DME and/or AE instructions  RECOMMENDED OTHER SERVICES: n/a  CONSULTED AND AGREED WITH PLAN OF CARE: Patient and family adult nurse- wife Rock GORY FOR NEXT SESSION:  donning/ doffing jacket, work towards long term goals.  Hx of multiple hip dislocations, exercise caution with LE movement, twisting, no bending >  90*    Chemika Nightengale, OTR/L  06/03/2024, 12:45 PM

## 2024-06-07 ENCOUNTER — Ambulatory Visit: Admitting: Occupational Therapy

## 2024-06-14 ENCOUNTER — Ambulatory Visit: Admitting: Occupational Therapy

## 2024-06-14 ENCOUNTER — Encounter: Payer: Self-pay | Admitting: Occupational Therapy

## 2024-06-14 DIAGNOSIS — M25622 Stiffness of left elbow, not elsewhere classified: Secondary | ICD-10-CM

## 2024-06-14 DIAGNOSIS — R2689 Other abnormalities of gait and mobility: Secondary | ICD-10-CM

## 2024-06-14 DIAGNOSIS — R2681 Unsteadiness on feet: Secondary | ICD-10-CM

## 2024-06-14 DIAGNOSIS — R29818 Other symptoms and signs involving the nervous system: Secondary | ICD-10-CM

## 2024-06-14 DIAGNOSIS — M25621 Stiffness of right elbow, not elsewhere classified: Secondary | ICD-10-CM

## 2024-06-14 DIAGNOSIS — R278 Other lack of coordination: Secondary | ICD-10-CM

## 2024-06-14 DIAGNOSIS — M6281 Muscle weakness (generalized): Secondary | ICD-10-CM

## 2024-06-14 NOTE — Therapy (Signed)
 OUTPATIENT OCCUPATIONAL THERAPY PARKINSON'S TREATMENT  Patient Name: Jim Carson MRN: [id number] DOB:Feb 17, 1951, 73 y.o., male Today's Date: 06/14/2024  PCP: Abran Slough, MD REFERRING PROVIDER: Georgina Collar MD  END OF SESSION:  OT End of Session - 06/14/24 1126     Visit Number 13    Number of Visits 25    Date for Recertification  06/17/24    Authorization Type Medicare, add KX    Authorization Time Period 12 weeks, anticipate d/c after 8 weeks    Authorization - Visit Number 13    Progress Note Due on Visit 20    OT Start Time 1104    OT Stop Time 1144    OT Time Calculation (min) 40 min                    Past Medical History:  Diagnosis Date   Abnormal gait    due to parkinson's,  uses cane   Absolute anemia 03/13/2023   Allergic rhinitis    Allergy Spring 1998   Allergic to dustmits, dog hair and roaches.   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 glendia  @[facility]   Pneumonia    Sleep apnea    Vitamin B12 deficiency    Vitamin D  deficiency    Wears glasses    Past Surgical History:  Procedure Laterality Date   APPENDECTOMY  07/1964   APPENDECTOMY     CHOLECYSTECTOMY     JOINT REPLACEMENT  March 2019 and again on 09/10/2021   Left hip twice.  2nd time due to 3 dislocations   LAPAROSCOPIC CHOLECYSTECTOMY  01-21-2017   @[facility] , [place]   lower back surgery      POSTERIOR CERVICAL FUSION/FORAMINOTOMY     POSTERIOR FUSION CERVICAL SPINE  08-15-2011   @[facility] ,  [place]   w/ laminectomy and decompression-- C3 -- T1   POSTERIOR LAMINECTOMY / DECOMPRESSION LUMBAR SPINE  07-30-2013   @[facility] , [place]   bilateral T11 - 12,  L2 --5   REMOVAL LEFT T1 PARASPINOUS MASS  02-12-2016   @[facility] ,  [place]   desmoid fibromatosis   SHOULDER ARTHROSCOPY WITH ROTATOR CUFF REPAIR Left 12/15/2018   Procedure: SHOULDER ARTHROSCOPY, biceps tenodesis labored Debridement, submacromial decompression;  Surgeon: Gerome Lamar, MD;  Location: [facility];  Service: Orthopedics;  Laterality: Left;  interscalene block   SPINE SURGERY     TOTAL HIP ARTHROPLASTY Left 09/18/2017   @[facility] , [place]   TOTAL HIP REVISION Left 09/10/2021   Procedure: Left hip acetabular versus total hip arthroplasty revision, constrained liner;  Surgeon: Melodi Lerner, MD;  Location: [facility];  Service: Orthopedics;  Laterality: Left;   TOTAL HIP REVISION Left 10/01/2023   Procedure: Left hip constrained liner revision;  Surgeon: Melodi Lerner, MD;  Location: [facility];  Service: Orthopedics;  Laterality: Left;    Patient Active Problem List   Diagnosis Date Noted  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   Thrombocytopenia 03/17/2023   Absolute anemia 03/13/2023   Family history of prostate cancer 03/13/2023   History of elevated PSA 03/13/2023   AV block, 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   Drug induced constipation 12/30/2022   Acquired hypothyroidism 12/30/2022   Primary osteoarthritis of left hip 05/18/2021   PD (Parkinson's disease) (HCC) 05/18/2021   History of gastroesophageal reflux (GERD) 05/18/2021   OSA (obstructive sleep apnea) 05/18/2021   Essential hypertension 05/18/2021    ONSET DATE: referral 12/09/23  REFERRING DIAG:  Diagnosis  G20.B2 (ICD-10-CM) - Parkinson's disease with dyskinesia, with fluctuations    THERAPY DIAG:  Other symptoms and signs involving the nervous system  Other lack of coordination  Stiffness  of left elbow, not elsewhere classified  Stiffness of right elbow, not elsewhere classified  Muscle weakness (generalized)  Unsteadiness on feet  Other abnormalities of gait and mobility  Rationale for Evaluation and Treatment: Rehabilitation  SUBJECTIVE:   SUBJECTIVE STATEMENT: Pt reports mild back pain  Goes by Jim Carson  PERTINENT HISTORY: Pt reports he had a L hip replacement in 2019 and dislocated it 3 times. Re did it 2023, had a fall and jammed it and had to revise it 10/01/23. Was told not to go too heavy over 110 lbs on the leg press. Was told not to cross his legs  PMH: Parkinson's Disease, hypothyroidism, hx of multiple concussions, GERD, benign essential HTN, abnormal gait, L THA 2019, removal of T1 paraspinous mass 2017, T11-12 and L2-5 lumbar lami/decompression 2015, cervical fusion C3-T1 2013, hx of shoulder surgery June 2020, hip surgery revision 10/01/23 after multiple dislocations  PRECAUTIONS:follow hip precautions avoid bending greater than 90, avoid rotating hip/ foot inwards/ outwards, do not cross legs Other: hip revision, told not to cross legs  WEIGHT BEARING RESTRICTIONS: No  PAIN:  Are you having pain? Yes: NPRS scale: 0-2/10 Pain location: hip, wrist Pain description: aching Aggravating factors: wrist extension Relieving factors: rest  FALLS: Has patient fallen in last 6 months? Yes. Number of falls 3, Pt saw PT to address  LIVING ENVIRONMENT: Lives with: lives with their family and lives with their spouse Lives in: House/apartment  Has following equipment at home: Environmental Consultant - 4 wheeled and shower chair  PLOF: Independent with basic ADLs  PATIENT GOALS: improve grip and coordination, wants to play guitar better  OBJECTIVE:  Note: Objective measures were completed at Evaluation unless otherwise noted.  HAND DOMINANCE: Left  ADLs:  Overall ADLs: increased time required Transfers/ambulation related to ADLs: Eating: difficulty holding knife to cut  food  Grooming: mod I UB Dressing:needs assist with buttons at times LB Dressing: min A at times with socks Toileting: mod  Bathing: mod I Tub Shower transfers: mod I, walk in shower with seat Equipment: Shower seat with back and Walk in shower Has an up walker at home IADLs:  Light housekeeping: Pt does laundry, and cleans up after meals  Meal Prep: wife prepares Community mobility: mod I with rollator Medication management: keeps up with meds Financial management: keeps up with finanaces Handwriting: 50% legible and Moderate micrographia  MOBILITY STATUS: Independent  POSTURE COMMENTS:  rounded shoulders, forward head, and flexed trunk    FUNCTIONAL OUTCOME MEASURES: Fastening/unfastening 3 buttons: 1 min 29 secs Physical performance test: PPT#2 (simulated eating) 14.34 with 1 drop & PPT#4 (donning/doffing jacket): NT  COORDINATION: 9 Hole Peg test: Right: 2 mins.06 sec multiple drops; Left: 43.83 sec Box and Blocks:  Right 40 blocks, Left 47blocks  UE ROM:  RUE shoulder flexion 120, elbow extension -20, LUE shoulder flexion 130, elbow extension -20, decreased bilateral grip/ composite flexion due to arthritis  COGNITION: Overall cognitive status: Within functional limits for tasks assessed  OBSERVATIONS: Bradykinesia                                                                                                                    TREATMENT DATE:06/14/24 UBE x 6 mins level 1 for conditioning  Standing with back against wall hands on rollator in front then with min A from therapist standing with palms against wall for PWR! up while squeezing shoulder blades back, then arms in abduction on wall with min A, v.c and facilitation for upright posture PWR! rock at [employer], modified quadraped at [employer] 10-20 reps each Dynamic step and reach to flip playing cards to left and right sides, min facilitation and assist for balance checked 9 hole peg test and PPT#2 simulated  eating in prep for renewal next visit. Therapist recommends pt works more with flipping playing cards and coins at home for improved coordination  Pt reports participating in PD cycle, therapist recommends pt does not participate in rock steady at home due to fall risk. Pt to call MD for a referral to PT.  06/03/24- UBE x 5 mins level 1 for conditioning  Standing with back against wall hands on rollator in front then with min A from therapist standing with palms against wall for PWR! up while squeezing shoulder blades back, then arms in abduction on wall with min A, v.c and facilitation for upright posture PWR! rock at [employer], modified quadraped at [employer] 10-20 reps each Sit to stand with emphasis on calibration, PWR! up for sit to stand, and focus on controlled sit Naviagiting gym with big steps and upright posture, mod v.c   05/31/24 UBE x 6 mins level 1 for conditioning min v.c to maintain 40 rpm Standing to perform PWR! rock and step  while flipping playing cards at countertop, marching min guard for balance, min v.c for amplitude Seated PWR! up without rocking forwards, then closed chain shoulder flexion with cane, chest press and abduction,  min v.c Standing with back against wall hands on rollator in front then with min A from therapist standing with palms against wall for PWR! up while squeezing shoulder blades back, then arms in abduction on wall with min A, v.c and facilitation for upright posture Pt's posture was improved at end of session. Discussion with pt regarding safety for turns and sit to stand as pt moves very quickly and is at risk for falls   05/25/24-Therapist checked progress in prep for progress note, see short term goals below. Handwriting activity for continuous l, continuouse followed by writing activity, pt was able to write a sentence with 100% legibility and minimal decrease in letter size. PWR! hands for PWR! up prior to task discussed importance of  avoiding twisting at hips to avoid hip dislocation.  Standing to  perform PWR! rock and step at countertop, marching min guard for balance, min v.c for amplitude Seated PWR! up without rocking forwards, then closed chain shoulder flexion with cane, min v.c discussed avoiding biceps curls at the gym due to likelihood of making elbows tight and difficulty with elbow extension UBE x 5 mins level 1 for conditioning  05/21/24:  Closed-chain shoulder flex in sitting with BUEs with foam noodle and min cueing for posture and elbow ext followed by diagonals to each side with gentle wt. Shift and trunk rotation with min cueing.  PWR! Hands x10 with min cueing for large amplitude movements.  Checked shoulder/elbow ROM for functional reach (see goals section below).  Practiced functional reaching with each UE to grasp/release cylinder objects incorporating reaching laterally and across body with min cueing to slow down, large amplitude,  and track hand/object with eyes to decr drops/knocking items over.  Reviewed strategies for fastening/unfastening buttons and practiced with min v.c. (also checked goal--see below).    Practiced writing with min cueing for large amplitude.  Pt wrote 8 sentences with approx 95% legibility and min decr in size.    Checked PPT#2 (see below) with no drops, min cueing for grasp on utensils.    05/11/24 Pt missed therapy last week due to pt's wife's illness.  supine on mat with hotpack underback, 2 pillows under head and roll under knees PWR! up with min/ mod facilitation followed by cane exercises for chest press and shoulder flexion, bilateral shoulder abduction- then clapping hands together over chest, no twist due to hx of hip dislocation, no adverse reactions to heat x 15 mins, pain decreased Seated, PWR up for arms without leaning forward then reaching to side unilaterally with right and left UE's, mod v.c Standing a t countertop, stepping to left na dright sides while  holding onto ocuntetop then PWR! rock , then stepping while reaching for targets on cabinets, min-mod v.c for amplitude and posture, close supervision for balance PWR! hands for PWR! up, twist and step. Therapist reviewed dealing cards with thumb for left and right hands. Pt was encouraged to perform coordination activities at  home. Therapist checked bilateral 9 hole peg test, see goals below.   PATIENT EDUCATION: Education details:  see above for strategies and cueing Person educated: Patient Education method: Explanation, Demonstration, Tactile cues, Verbal cues,  Education comprehension: verbalized understanding, returned demonstration, verbal cues required, and needs further education   HOME EXERCISE PROGRAM:  03/30/24-cane exercises in supine 04/02/24:  PWR! Hands, Coordination HEP with focus on large amplitude  04/09/24:  Strategies for eating and writing  GOALS: Goals reviewed with patient? Yes  SHORT TERM GOALS: Target date: 04/23/24   I with PD specific HEP Goal status: met, 04/29/24  2.  I with adapted strategies/AE to maximize safety and I with ADLs/IADLs. Goal status: ongoing  05/31/24  3.   Pt will demonstrate improved fine motor coordination for ADLs as evidenced by decreasing 9 hole peg test score for RUE to 2 mins or less without drops Goal status:partially met 48 secs with drops  05/25/24, not met 2 mins 22 secs- 1st trial several drops, 1 min 22 secs -06/14/24,   4.   Pt will demonstrate improved fine motor coordination for ADLs as evidenced by decreasing 9 hole peg test score for LUE by 3 secs Baseline: LUE 43.83 secs Goal status:  met, 37.10 05/11/24  5.  Pt will demonstrate improved ease with feeding as evidenced by decreasing PPT#2 to 11.34 without  drops Baseline:  Goal status: ongoing 04/06/24, 05/11/24- ongoing.  05/21/24 ongoing 16.31sec without drops, 05/25/24-15.33 secs 06/14/24 15.66, ongoing  6.  Pt will write a  sentence with 100% legibility and  minimal decrease in letter size Goal status:  met 05/25/24 LONG TERM GOALS: Target date: 06/17/24  Pt will demonstrate improved ease with fastening buttons as evidenced by decreasing 3 button/ unbutton time to :1 min 20 secs or less Baseline: 1 min 29 secs Goal status: 05/21/24 MET 56.19sec  2.  Pt will demonstrate ability to retrieve a lightweight object at 125 shoulder flexion and -15 elbow extension with RUE Goal status: 05/21/24  Ongoing 120* with -20 elbow ext  3. Pt will demonstrate ability to retrieve a lightweight object at 130 shoulder flexion and -15 elbow extension with LUE Goal status: 05/21/24  Ongoing/partially met 145* with -20 elbow ext  4.  Pt will verbalize understanding of ways to prevent future PD related complications and PD community resources. Goal status:  ononging 06/14/24  5.   Improve PPT#4 to 22 secs or less for increased ease with dressing Baseline: 26.24 Goal status: ongoing 05/31/24  6.  Pt will report dropping utensils. and cups less frequently Goal status:  ongoing 06/14/24   ASSESSMENT:  CLINICAL IMPRESSION: Pt is progressing towards goals overall with improving posture and mobility. Fine motor coordination activities remain challenging.  PERFORMANCE DEFICITS: in functional skills including ADLs, IADLs, coordination, dexterity, ROM, strength, flexibility, Fine motor control, Gross motor control, mobility, balance, decreased knowledge of precautions, decreased knowledge of use of DME, and UE functional use, , and psychosocial skills including coping strategies, environmental adaptation, habits, interpersonal interactions, and routines and behaviors.   IMPAIRMENTS: are limiting patient from ADLs, IADLs, rest and sleep, play, leisure, and social participation.   COMORBIDITIES:  may have co-morbidities  that affects occupational performance. Patient will benefit from skilled OT to address above impairments and improve overall function.  MODIFICATION OR  ASSISTANCE TO COMPLETE EVALUATION: No modification of tasks or assist necessary to complete an evaluation.  OT OCCUPATIONAL PROFILE AND HISTORY: Detailed assessment: Review of records and additional review of physical, cognitive, psychosocial history related to current functional performance.  CLINICAL DECISION MAKING: LOW - limited treatment options, no task modification necessary  REHAB POTENTIAL: Good  EVALUATION COMPLEXITY: Low   PLAN:  OT FREQUENCY: 2x/week  OT DURATION: 12 weeks- anticipate d/c after 8 weeks  PLANNED INTERVENTIONS: 97168 OT Re-evaluation, 97535 self care/ADL training, 02889 therapeutic exercise, 97530 therapeutic activity, 97112 neuromuscular re-education, 97140 manual therapy, 97035 ultrasound, 97018 paraffin, 02989 moist heat, 97010 cryotherapy, 97760 Orthotic Initial, 97763 Orthotic/Prosthetic subsequent, passive range of motion, balance training, functional mobility training, energy conservation, coping strategies training, patient/family education, and DME and/or AE instructions  RECOMMENDED OTHER SERVICES: n/a  CONSULTED AND AGREED WITH PLAN OF CARE: Patient and family adult nurse- wife Rock GORY FOR NEXT SESSION:  check goals for renewal.  Hx of multiple hip dislocations, exercise caution with LE movement, twisting, no bending > 90*    Taeshawn Helfman, OTR/L  06/14/2024, 11:28 AM

## 2024-06-17 ENCOUNTER — Ambulatory Visit: Admitting: Occupational Therapy

## 2024-06-17 DIAGNOSIS — R29818 Other symptoms and signs involving the nervous system: Secondary | ICD-10-CM

## 2024-06-17 DIAGNOSIS — R278 Other lack of coordination: Secondary | ICD-10-CM

## 2024-06-17 DIAGNOSIS — R2689 Other abnormalities of gait and mobility: Secondary | ICD-10-CM

## 2024-06-17 DIAGNOSIS — M25622 Stiffness of left elbow, not elsewhere classified: Secondary | ICD-10-CM

## 2024-06-17 DIAGNOSIS — R293 Abnormal posture: Secondary | ICD-10-CM

## 2024-06-17 DIAGNOSIS — M25621 Stiffness of right elbow, not elsewhere classified: Secondary | ICD-10-CM

## 2024-06-17 DIAGNOSIS — R2681 Unsteadiness on feet: Secondary | ICD-10-CM

## 2024-06-17 DIAGNOSIS — M6281 Muscle weakness (generalized): Secondary | ICD-10-CM

## 2024-06-17 NOTE — Therapy (Signed)
 OUTPATIENT OCCUPATIONAL THERAPY PARKINSON'S TREATMENT  Patient Name: Jim Carson MRN: [id number] DOB:[birth year], 73 y.o., male Today's Date: 06/17/2024  PCP: Abran Slough, MD REFERRING PROVIDER: Georgina Collar MD  END OF SESSION:  OT End of Session - 06/17/24 1116     Visit Number 14    Number of Visits 36    Date for Recertification  09/09/24    Authorization Type Medicare, add KX    Authorization Time Period 12 weeks, anticipate d/c after 8 weeks    Authorization - Visit Number 14    Progress Note Due on Visit 20    OT Start Time 1115    OT Stop Time 1145    OT Time Calculation (min) 30 min    Activity Tolerance Patient tolerated treatment well    Behavior During Therapy [facility] for tasks assessed/performed                     Past Medical History:  Diagnosis Date   Abnormal gait    due to parkinson's,  uses cane   Absolute anemia 03/13/2023   Allergic rhinitis    Allergy [place] 1998   Allergic to dustmits, dog hair and roaches.   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 glendia  @[facility]   Pneumonia    Sleep apnea    Vitamin B12 deficiency    Vitamin D  deficiency    Wears glasses    Past Surgical History:  Procedure Laterality Date   APPENDECTOMY  07/1964   APPENDECTOMY     CHOLECYSTECTOMY     JOINT REPLACEMENT  March 2019 and again on 09/10/2021   Left hip twice.  2nd time due to 3 dislocations   LAPAROSCOPIC CHOLECYSTECTOMY  01-21-2017   @[facility] , [place]   lower  back surgery      POSTERIOR CERVICAL FUSION/FORAMINOTOMY     POSTERIOR FUSION CERVICAL SPINE  08-15-2011   @[facility] , [place]   w/ laminectomy and decompression-- C3 -- T1   POSTERIOR LAMINECTOMY / DECOMPRESSION LUMBAR SPINE  07-30-2013   @[facility] , [place]   bilateral T11 - 12,  L2 --5   REMOVAL LEFT T1 PARASPINOUS MASS  02-12-2016   @[facility] ,  [place]   desmoid fibromatosis   SHOULDER ARTHROSCOPY WITH ROTATOR CUFF REPAIR Left 12/15/2018   Procedure: SHOULDER ARTHROSCOPY, biceps tenodesis labored Debridement, submacromial decompression;  Surgeon: Gerome Lamar, MD;  Location: [facility];  Service: Orthopedics;  Laterality: Left;  interscalene block   SPINE SURGERY     TOTAL HIP ARTHROPLASTY Left 09/18/2017   @[facility] , [place]   TOTAL HIP REVISION Left 09/10/2021   Procedure: Left hip acetabular versus total hip arthroplasty revision, constrained liner;  Surgeon: Melodi Lerner, MD;  Location: [facility];  Service: Orthopedics;  Laterality: Left;   TOTAL HIP REVISION Left 10/01/2023   Procedure: Left hip constrained liner revision;  Surgeon: Melodi Lerner, MD;  Location: [facility];  Service: Orthopedics;  Laterality: Left;    Patient Active Problem List   Diagnosis Date Noted   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   Thrombocytopenia 03/17/2023   Absolute anemia 03/13/2023   Family history of prostate cancer 03/13/2023   History of elevated PSA 03/13/2023   AV block, 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   Drug induced constipation 12/30/2022   Acquired hypothyroidism 12/30/2022   Primary osteoarthritis of left hip 05/18/2021   PD (Parkinson's disease) (HCC) 05/18/2021   History of gastroesophageal reflux (GERD) 05/18/2021   OSA (obstructive sleep apnea) 05/18/2021   Essential hypertension 05/18/2021    ONSET DATE: referral 12/09/23  REFERRING DIAG:  Diagnosis  G20.B2 (ICD-10-CM) - Parkinson's disease with dyskinesia, with fluctuations     THERAPY DIAG:  Other symptoms and signs involving the nervous system - Plan: Ot plan of care cert/re-cert  Other lack of coordination - Plan: Ot plan of care cert/re-cert  Stiffness of left elbow, not elsewhere classified - Plan: Ot plan of care cert/re-cert  Stiffness of right elbow, not elsewhere classified - Plan: Ot plan of care cert/re-cert  Muscle weakness (generalized) - Plan: Ot plan of care cert/re-cert  Abnormal posture - Plan: Ot plan of care cert/re-cert  Other abnormalities of gait and mobility - Plan: Ot plan of care cert/re-cert  Unsteadiness on feet - Plan: Ot plan of care cert/re-cert  Rationale for Evaluation and Treatment: Rehabilitation  SUBJECTIVE:   SUBJECTIVE STATEMENT: Pt reports car wouldn't start today  Goes by Jim Carson  PERTINENT HISTORY: Pt reports he had a L hip replacement in 2019 and dislocated it 3 times. Re did it 2023, had a fall and jammed it and had to revise it 10/01/23. Was told not to go too heavy over 110 lbs on the leg press. Was told not to cross his legs  PMH: Parkinson's Disease, hypothyroidism, hx of multiple concussions, GERD, benign essential HTN, abnormal gait, L THA 2019, removal of T1 paraspinous mass 2017, T11-12 and L2-5 lumbar lami/decompression 2015, cervical fusion C3-T1 2013, hx of shoulder surgery June 2020, hip surgery revision 10/01/23 after multiple dislocations  PRECAUTIONS:follow hip precautions avoid bending greater than 90, avoid rotating hip/ foot inwards/ outwards, do not cross legs Other: hip revision, told not to cross legs  WEIGHT BEARING RESTRICTIONS: No  PAIN:  Are you having pain? Yes: NPRS scale: 0-2/10 Pain location: hip, wrist Pain description: aching Aggravating factors: wrist extension Relieving factors: rest  FALLS: Has patient fallen in last 6 months? Yes. Number of falls 3, Pt saw PT to address  LIVING ENVIRONMENT: Lives with: lives with their family and lives with their spouse Lives in:  House/apartment  Has following equipment at home: Environmental Consultant - 4 wheeled and shower chair  PLOF: Independent with basic ADLs  PATIENT GOALS: improve grip and coordination, wants to play guitar better  OBJECTIVE:  Note: Objective measures were completed at Evaluation unless otherwise noted.  HAND DOMINANCE: Left  ADLs:  Overall ADLs: increased time required Transfers/ambulation related to ADLs: Eating: difficulty holding knife to cut food  Grooming: mod I UB Dressing:needs assist with buttons at times LB Dressing: min A at times with socks Toileting: mod  Bathing: mod I Tub Shower transfers: mod I, walk in shower with seat Equipment: Shower seat with back and Walk in shower Has an up walker at home IADLs:  Light housekeeping: Pt does laundry, and cleans up after meals  Meal Prep: wife prepares Community mobility: mod I with  rollator Medication management: keeps up with meds Financial management: keeps up with finanaces Handwriting: 50% legible and Moderate micrographia  MOBILITY STATUS: Independent  POSTURE COMMENTS:  rounded shoulders, forward head, and flexed trunk    FUNCTIONAL OUTCOME MEASURES: Fastening/unfastening 3 buttons: 1 min 29 secs Physical performance test: PPT#2 (simulated eating) 14.34 with 1 drop & PPT#4 (donning/doffing jacket): NT  COORDINATION: 9 Hole Peg test: Right: 2 mins.06 sec multiple drops; Left: 43.83 sec Box and Blocks:  Right 40 blocks, Left 47blocks  UE ROM:  RUE shoulder flexion 120, elbow extension -20, LUE shoulder flexion 130, elbow extension -20, decreased bilateral grip/ composite flexion due to arthritis  COGNITION: Overall cognitive status: Within functional limits for tasks assessed  OBSERVATIONS: Bradykinesia                                                                                                                    TREATMENT DATE: 12/18/25Standing with back against wall hands on rollator in front then with min A from  therapist standing with palms against wall for PWR! up while squeezing shoulder blades back, then arms in abduction on wall with min A, v.c and facilitation for upright posture PWR! rock at [employer], modified quadraped at [employer] 10-20 reps each Seate closed cahin chest press and shoulder flxion, mod v.c Therapist checked progress towards goals see goals below. 06/14/24 UBE x 6 mins level 1 for conditioning  Standing with back against wall hands on rollator in front then with min A from therapist standing with palms against wall for PWR! up while squeezing shoulder blades back, then arms in abduction on wall with min A, v.c and facilitation for upright posture PWR! rock at [employer], modified quadraped at [employer] 10-20 reps each Dynamic step and reach to flip playing cards to left and right sides, min facilitation and assist for balance checked 9 hole peg test and PPT#2 simulated eating in prep for renewal next visit. Therapist recommends pt works more with flipping playing cards and coins at home for improved coordination  Pt reports participating in PD cycle, therapist recommends pt does not participate in rock steady at home due to fall risk. Pt to call MD for a referral to PT.  06/03/24- UBE x 5 mins level 1 for conditioning  Standing with back against wall hands on rollator in front then with min A from therapist standing with palms against wall for PWR! up while squeezing shoulder blades back, then arms in abduction on wall with min A, v.c and facilitation for upright posture PWR! rock at [employer], modified quadraped at [employer] 10-20 reps each Sit to stand with emphasis on calibration, PWR! up for sit to stand, and focus on controlled sit Naviagiting gym with big steps and upright posture, mod v.c   05/31/24 UBE x 6 mins level 1 for conditioning min v.c to maintain 40 rpm Standing to perform PWR! rock and step  while flipping playing cards at countertop, marching min guard for  balance, min v.c for amplitude Seated PWR!  up without rocking forwards, then closed chain shoulder flexion with cane, chest press and abduction,  min v.c Standing with back against wall hands on rollator in front then with min A from therapist standing with palms against wall for PWR! up while squeezing shoulder blades back, then arms in abduction on wall with min A, v.c and facilitation for upright posture Pt's posture was improved at end of session. Discussion with pt regarding safety for turns and sit to stand as pt moves very quickly and is at risk for falls   05/25/24-Therapist checked progress in prep for progress note, see short term goals below. Handwriting activity for continuous l, continuouse followed by writing activity, pt was able to write a sentence with 100% legibility and minimal decrease in letter size. PWR! hands for PWR! up prior to task discussed importance of avoiding twisting at hips to avoid hip dislocation.  Standing to perform PWR! rock and step at countertop, marching min guard for balance, min v.c for amplitude Seated PWR! up without rocking forwards, then closed chain shoulder flexion with cane, min v.c discussed avoiding biceps curls at the gym due to likelihood of making elbows tight and difficulty with elbow extension UBE x 5 mins level 1 for conditioning  05/21/24:  Closed-chain shoulder flex in sitting with BUEs with foam noodle and min cueing for posture and elbow ext followed by diagonals to each side with gentle wt. Shift and trunk rotation with min cueing.  PWR! Hands x10 with min cueing for large amplitude movements.  Checked shoulder/elbow ROM for functional reach (see goals section below).  Practiced functional reaching with each UE to grasp/release cylinder objects incorporating reaching laterally and across body with min cueing to slow down, large amplitude,  and track hand/object with eyes to decr drops/knocking items over.  Reviewed strategies for  fastening/unfastening buttons and practiced with min v.c. (also checked goal--see below).    Practiced writing with min cueing for large amplitude.  Pt wrote 8 sentences with approx 95% legibility and min decr in size.    Checked PPT#2 (see below) with no drops, min cueing for grasp on utensils.    05/11/24 Pt missed therapy last week due to pt's wife's illness.  supine on mat with hotpack underback, 2 pillows under head and roll under knees PWR! up with min/ mod facilitation followed by cane exercises for chest press and shoulder flexion, bilateral shoulder abduction- then clapping hands together over chest, no twist due to hx of hip dislocation, no adverse reactions to heat x 15 mins, pain decreased Seated, PWR up for arms without leaning forward then reaching to side unilaterally with right and left UE's, mod v.c Standing a t countertop, stepping to left and right sides while holding onto ocuntetop then PWR! rock , then stepping while reaching for targets on cabinets, min-mod v.c for amplitude and posture, close supervision for balance PWR! hands for PWR! up, twist and step. Therapist reviewed dealing cards with thumb for left and right hands. Pt was encouraged to perform coordination activities at  home. Therapist checked bilateral 9 hole peg test, see goals below.   PATIENT EDUCATION: Education details:  progress towards goals, plans for renewal Person educated: Patient Education method: Explanation, Demonstration, Tactile cues, Verbal cues,  Education comprehension: verbalized understanding, returned demonstration, verbal cues required, and needs further education   HOME EXERCISE PROGRAM:  03/30/24-cane exercises in supine 04/02/24:  PWR! Hands, Coordination HEP with focus on large amplitude  04/09/24:  Strategies for eating and writing  GOALS: Goals reviewed with patient? Yes  SHORT TERM GOALS: Target date: 07/30/24   I with PD specific HEP Goal status: met, 04/29/24  2.  I  with adapted strategies/AE to maximize safety and I with ADLs/IADLs. Goal status: ongoing  06/17/24  3.   Pt will demonstrate improved fine motor coordination for ADLs as evidenced by decreasing 9 hole peg test score for RUE to 2 mins or less without drops Reveised goal: Pt will demonstrate improved fine motor coordination for ADLs as evidenced by decreasing 9 hole peg test score for RUE to 1 min 15 secs with no more than 2 drops. Goal status:partially met 48 secs with drops  05/25/24,  not met 2 mins 22 secs- 1st trial several drops, 1 min 22 secs -06/14/24,  ongoing 06/17/24- 1 min 22 secs with several drops , continue with revised goal  4.   Pt will demonstrate improved fine motor coordination for ADLs as evidenced by decreasing 9 hole peg test score for LUE by 3 secs Baseline: LUE 43.83 secs Goal status:  met, 37.10 05/11/24  5.  Pt will demonstrate improved ease with feeding as evidenced by decreasing PPT#2 to 11.34 without drops Baseline:  Goal status: ongoing 04/06/24, 05/11/24- ongoing.  05/21/24 ongoing 16.31sec without drops, 05/25/24-15.33 secs  06/17/24 ongoing 15.81  16.  Pt will write a  sentence with 100% legibility and minimal decrease in letter size Goal status:  met 05/25/24 LONG TERM GOALS: Target date: 09/09/24  Pt will demonstrate improved ease with fastening buttons as evidenced by decreasing 3 button/ unbutton time to :1 min 20 secs or less Baseline: 1 min 29 secs Goal status: 05/21/24 MET 56.19sec  2.  Pt will demonstrate ability to retrieve a lightweight object at 125 shoulder flexion and -15 elbow extension with RUE Goal status:ongoing 115, -25 06/17/24  3. Pt will demonstrate ability to retrieve a lightweight object at 130 shoulder flexion and -15 elbow extension with LUE Goal status: met 130, -15  4.  Pt will verbalize understanding of ways to prevent future PD related complications and PD community resources. Goal status:  ononging 06/17/24  5.    Improve PPT#4 to 22 secs or less for increased ease with dressing Baseline: 26.24 Goal status: ongoing 1 minute,  seated 06/17/24  6.  Pt will report dropping utensils. and cups less frequently Goal status:  ongoing, still dropping, reveiwed strategies 06/17/24   ASSESSMENT:  CLINICAL IMPRESSION: Pt is progressing towards goals He achieve 3/6 short term goals and 2/6 long term goals. Pt can benefit from continued skilled occupational therapy to address the performance deficits below in order to maximize pt's safety and independence with ADLs/IADLs. PERFORMANCE DEFICITS: in functional skills including ADLs, IADLs, coordination, dexterity, ROM, strength, flexibility, Fine motor control, Gross motor control, mobility, balance, decreased knowledge of precautions, decreased knowledge of use of DME, and UE functional use, , and psychosocial skills including coping strategies, environmental adaptation, habits, interpersonal interactions, and routines and behaviors.   IMPAIRMENTS: are limiting patient from ADLs, IADLs, rest and sleep, play, leisure, and social participation.   COMORBIDITIES:  may have co-morbidities  that affects occupational performance. Patient will benefit from skilled OT to address above impairments and improve overall function.  MODIFICATION OR ASSISTANCE TO COMPLETE EVALUATION: No modification of tasks or assist necessary to complete an evaluation.  OT OCCUPATIONAL PROFILE AND HISTORY: Detailed assessment: Review of records and additional review of physical, cognitive, psychosocial history related to current functional performance.  CLINICAL DECISION MAKING: LOW -  limited treatment options, no task modification necessary  REHAB POTENTIAL: Good  EVALUATION COMPLEXITY: Low   PLAN:  OT FREQUENCY: 2x/week  OT DURATION: 12 weeks- anticipate d/c after 8 weeks  PLANNED INTERVENTIONS: 97168 OT Re-evaluation, 97535 self care/ADL training, 02889 therapeutic exercise, 97530  therapeutic activity, 97112 neuromuscular re-education, 97140 manual therapy, 97035 ultrasound, 97018 paraffin, 02989 moist heat, 97010 cryotherapy, 97760 Orthotic Initial, 97763 Orthotic/Prosthetic subsequent, passive range of motion, balance training, functional mobility training, energy conservation, coping strategies training, patient/family education, and DME and/or AE instructions  RECOMMENDED OTHER SERVICES: n/a  CONSULTED AND AGREED WITH PLAN OF CARE: Patient and family adult nurse- wife Rock GORY FOR NEXT SESSION:   work towards unmet goals  Hx of multiple hip dislocations, exercise caution with LE movement, twisting, no bending > 90*    Lezli Danek, OTR/L  06/17/2024, 12:12 PM

## 2024-06-22 ENCOUNTER — Ambulatory Visit: Payer: Self-pay | Admitting: Occupational Therapy

## 2024-06-29 ENCOUNTER — Ambulatory Visit: Payer: Self-pay | Admitting: Occupational Therapy

## 2024-06-29 ENCOUNTER — Encounter: Payer: Self-pay | Admitting: Occupational Therapy

## 2024-06-29 DIAGNOSIS — R2689 Other abnormalities of gait and mobility: Secondary | ICD-10-CM

## 2024-06-29 DIAGNOSIS — R2681 Unsteadiness on feet: Secondary | ICD-10-CM

## 2024-06-29 DIAGNOSIS — R29818 Other symptoms and signs involving the nervous system: Secondary | ICD-10-CM | POA: Diagnosis not present

## 2024-06-29 DIAGNOSIS — M6281 Muscle weakness (generalized): Secondary | ICD-10-CM

## 2024-06-29 DIAGNOSIS — R278 Other lack of coordination: Secondary | ICD-10-CM

## 2024-06-29 DIAGNOSIS — M25621 Stiffness of right elbow, not elsewhere classified: Secondary | ICD-10-CM

## 2024-06-29 DIAGNOSIS — M25622 Stiffness of left elbow, not elsewhere classified: Secondary | ICD-10-CM

## 2024-06-29 DIAGNOSIS — R293 Abnormal posture: Secondary | ICD-10-CM

## 2024-06-29 NOTE — Therapy (Signed)
 " OUTPATIENT OCCUPATIONAL THERAPY PARKINSON'S TREATMENT  Patient Name: Jim Carson MRN: [id number] DOB:April 22, 1951, 73 y.o., male Today's Date: 06/29/2024  PCP: Abran Slough, MD REFERRING PROVIDER: Georgina Collar MD  END OF SESSION:  OT End of Session - 06/29/24 1110     Visit Number 15    Number of Visits 36    Date for Recertification  09/09/24    Authorization Type Medicare, add KX    Authorization Time Period 12 weeks, anticipate d/c after 8 weeks    Authorization - Visit Number 15    Progress Note Due on Visit 20    OT Start Time 1105    OT Stop Time 1145    OT Time Calculation (min) 40 min    Activity Tolerance Patient tolerated treatment well    Behavior During Therapy [facility] for tasks assessed/performed                     Past Medical History:  Diagnosis Date   Abnormal gait    due to parkinson's,  uses cane   Absolute anemia 03/13/2023   Allergic rhinitis    Allergy Spring 1998   Allergic to dustmits, dog hair and roaches.   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 glendia  @[facility]   Pneumonia    Sleep apnea    Vitamin B12 deficiency    Vitamin D  deficiency    Wears glasses    Past Surgical History:  Procedure Laterality Date   APPENDECTOMY  07/1964   APPENDECTOMY     CHOLECYSTECTOMY     JOINT REPLACEMENT  March 2019 and again on 09/10/2021   Left hip twice.  2nd time due to 3 dislocations   LAPAROSCOPIC CHOLECYSTECTOMY  01-21-2017   @[facility] , [place]   lower  back surgery      POSTERIOR CERVICAL FUSION/FORAMINOTOMY     POSTERIOR FUSION CERVICAL SPINE  08-15-2011   @[facility] , [place]   w/ laminectomy and decompression-- C3 -- T1   POSTERIOR LAMINECTOMY / DECOMPRESSION LUMBAR SPINE  07-30-2013   @[facility] , [place]   bilateral T11 - 12,  L2 --5   REMOVAL LEFT T1 PARASPINOUS MASS  02-12-2016   @[facility] ,  [place]   desmoid fibromatosis   SHOULDER ARTHROSCOPY WITH ROTATOR CUFF REPAIR Left 12/15/2018   Procedure: SHOULDER ARTHROSCOPY, biceps tenodesis labored Debridement, submacromial decompression;  Surgeon: Gerome Lamar, MD;  Location: [facility];  Service: Orthopedics;  Laterality: Left;  interscalene block   SPINE SURGERY     TOTAL HIP ARTHROPLASTY Left 09/18/2017   @[facility] , [place]   TOTAL HIP REVISION Left 09/10/2021   Procedure: Left hip acetabular versus total hip arthroplasty revision, constrained liner;  Surgeon: Melodi Lerner, MD;  Location: [facility];  Service: Orthopedics;  Laterality: Left;   TOTAL HIP REVISION Left 10/01/2023   Procedure: Left hip constrained liner revision;  Surgeon: Melodi Lerner, MD;  Location: [facility];  Service: Orthopedics;  Laterality: Left;    Patient Active Problem List   Diagnosis Date Noted   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   Thrombocytopenia 03/17/2023   Absolute anemia 03/13/2023   Family history of prostate cancer 03/13/2023   History of elevated PSA 03/13/2023   AV block, 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   Drug induced constipation 12/30/2022   Acquired hypothyroidism 12/30/2022   Primary osteoarthritis of left hip 05/18/2021   PD (Parkinson's disease) (HCC) 05/18/2021   History of gastroesophageal reflux (GERD) 05/18/2021   OSA (obstructive sleep apnea) 05/18/2021   Essential hypertension 05/18/2021    ONSET DATE: referral 12/09/23  REFERRING DIAG:  Diagnosis  G20.B2 (ICD-10-CM) - Parkinson's disease with dyskinesia, with fluctuations     THERAPY DIAG:  Other symptoms and signs involving the nervous system  Other lack of coordination  Stiffness of left elbow, not elsewhere classified  Stiffness of right elbow, not elsewhere classified  Muscle weakness (generalized)  Abnormal posture  Other abnormalities of gait and mobility  Unsteadiness on feet  Rationale for Evaluation and Treatment: Rehabilitation  SUBJECTIVE:   SUBJECTIVE STATEMENT: Pt reports he had a nice Christmas  [name]  PERTINENT HISTORY: Pt reports he had a L hip replacement in 2019 and dislocated it 3 times. Re did it 2023, had a fall and jammed it and had to revise it 10/01/23. Was told not to go too heavy over 110 lbs on the leg press. Was told not to cross his legs  PMH: Parkinson's Disease, hypothyroidism, hx of multiple concussions, GERD, benign essential HTN, abnormal gait, L THA 2019, removal of T1 paraspinous mass 2017, T11-12 and L2-5 lumbar lami/decompression 2015, cervical fusion C3-T1 2013, hx of shoulder surgery June 2020, hip surgery revision 10/01/23 after multiple dislocations  PRECAUTIONS:follow hip precautions avoid bending greater than 90, avoid rotating hip/ foot inwards/ outwards, do not cross legs Other: hip revision, told not to cross legs  WEIGHT BEARING RESTRICTIONS: No  PAIN:  Are you having pain? Yes: NPRS scale: 0-2/10 Pain location: hip, wrist Pain description: aching Aggravating factors: wrist extension Relieving factors: rest  FALLS: Has patient fallen in last 6 months? Yes. Number of falls 3, Pt saw PT to address  LIVING ENVIRONMENT: Lives with: lives with their family and lives with their spouse Lives in: House/apartment  Has following equipment at home: Environmental Consultant - 4 wheeled and shower chair  PLOF: Independent with basic ADLs  PATIENT GOALS: improve grip and coordination, wants to play guitar better  OBJECTIVE:  Note: Objective measures were completed at Evaluation unless otherwise noted.  HAND  DOMINANCE: Left  ADLs:  Overall ADLs: increased time required Transfers/ambulation related to ADLs: Eating: difficulty holding knife to cut food  Grooming: mod I UB Dressing:needs assist with buttons at times LB Dressing: min A at times with socks Toileting: mod  Bathing: mod I Tub Shower transfers: mod I, walk in shower with seat Equipment: Shower seat with back and Walk in shower Has an up walker at home IADLs:  Light housekeeping: Pt does laundry, and cleans up after meals  Meal Prep: wife prepares Community mobility: mod I with rollator Medication management: keeps up with meds Financial management: keeps up with finanaces Handwriting: 50% legible and Moderate micrographia  MOBILITY STATUS: Independent  POSTURE COMMENTS:  rounded shoulders, forward head, and flexed trunk    FUNCTIONAL OUTCOME MEASURES: Fastening/unfastening 3 buttons: 1 min 29 secs Physical performance test: PPT#2 (simulated eating)  14.34 with 1 drop & PPT#4 (donning/doffing jacket): NT  COORDINATION: 9 Hole Peg test: Right: 2 mins.06 sec multiple drops; Left: 43.83 sec Box and Blocks:  Right 40 blocks, Left 47blocks  UE ROM:  RUE shoulder flexion 120, elbow extension -20, LUE shoulder flexion 130, elbow extension -20, decreased bilateral grip/ composite flexion due to arthritis  COGNITION: Overall cognitive status: Within functional limits for tasks assessed  OBSERVATIONS: Bradykinesia                                                                                                                    TREATMENT DATE:  06/29/24-UBE x 6 mins for conditioning Standing with back against wall hands on rollator in front then with superivion/ min A from therapist standing with palms against wall for PWR! up while squeezing shoulder blades back, then arms in abduction on wall with supevision-min A, v.c and facilitation for upright posture PWR! rock at [employer], modified quadraped at [employer] 10-20 reps  each Stepping forwards and backwards at countertop, rocking forwards and backwards for modified quaraped PWR! rock, marching at [employer], min-mod v.c these activities and min facilitlitation for amplitude and posture. Dynamic step and reach to flip playing cards, min-mod facillitation. PWR! hands basic 4 min v.c disccused safety for tying shoes, recommended elastic laces or slip on shoes due to hip prec. Placing grooved pegs into pegboard with RUE mod difficulty/ v.c for icnreased coordination   06/17/24 Standing with back against wall hands on rollator in front then with min A from therapist standing with palms against wall for PWR! up while squeezing shoulder blades back, then arms in abduction on wall with min A, v.c and facilitation for upright posture PWR! rock at [employer], modified quadraped at [employer] 10-20 reps each Seate closed chain chest press and shoulder flexion, mod v.c Therapist checked progress towards goals see goals below. 06/14/24 UBE x 6 mins level 1 for conditioning  Standing with back against wall hands on rollator in front then with min A from therapist standing with palms against wall for PWR! up while squeezing shoulder blades back, then arms in abduction on wall with min A, v.c and facilitation for upright posture PWR! rock at [employer], modified quadraped at [employer] 10-20 reps each Dynamic step and reach to flip playing cards to left and right sides, min facilitation and assist for balance checked 9 hole peg test and PPT#2 simulated eating in prep for renewal next visit. Therapist recommends pt works more with flipping playing cards and coins at home for improved coordination  Pt reports participating in PD cycle, therapist recommends pt does not participate in rock steady at home due to fall risk. Pt to call MD for a referral to PT.  06/03/24- UBE x 5 mins level 1 for conditioning  Standing with back against wall hands on rollator in front then with min A  from therapist standing with palms against wall for PWR! up while squeezing shoulder blades back, then arms in abduction on wall with min A,  v.c and facilitation for upright posture PWR! rock at [employer], modified quadraped at [employer] 10-20 reps each Sit to stand with emphasis on calibration, PWR! up for sit to stand, and focus on controlled sit [employer] gym with big steps and upright posture, mod v.c   05/31/24 UBE x 6 mins level 1 for conditioning min v.c to maintain 40 rpm Standing to perform PWR! rock and step  while flipping playing cards at countertop, marching min guard for balance, min v.c for amplitude Seated PWR! up without rocking forwards, then closed chain shoulder flexion with cane, chest press and abduction,  min v.c Standing with back against wall hands on rollator in front then with min A from therapist standing with palms against wall for PWR! up while squeezing shoulder blades back, then arms in abduction on wall with min A, v.c and facilitation for upright posture Pt's posture was improved at end of session. Discussion with pt regarding safety for turns and sit to stand as pt moves very quickly and is at risk for falls   05/25/24-Therapist checked progress in prep for progress note, see short term goals below. Handwriting activity for continuous l, continuouse followed by writing activity, pt was able to write a sentence with 100% legibility and minimal decrease in letter size. PWR! hands for PWR! up prior to task discussed importance of avoiding twisting at hips to avoid hip dislocation.  Standing to perform PWR! rock and step at countertop, marching min guard for balance, min v.c for amplitude Seated PWR! up without rocking forwards, then closed chain shoulder flexion with cane, min v.c discussed avoiding biceps curls at the gym due to likelihood of making elbows tight and difficulty with elbow extension UBE x 5 mins level 1 for conditioning  05/21/24:   Closed-chain shoulder flex in sitting with BUEs with foam noodle and min cueing for posture and elbow ext followed by diagonals to each side with gentle wt. Shift and trunk rotation with min cueing.  PWR! Hands x10 with min cueing for large amplitude movements.  Checked shoulder/elbow ROM for functional reach (see goals section below).  Practiced functional reaching with each UE to grasp/release cylinder objects incorporating reaching laterally and across body with min cueing to slow down, large amplitude,  and track hand/object with eyes to decr drops/knocking items over.  Reviewed strategies for fastening/unfastening buttons and practiced with min v.c. (also checked goal--see below).    Practiced writing with min cueing for large amplitude.  Pt wrote 8 sentences with approx 95% legibility and min decr in size.    Checked PPT#2 (see below) with no drops, min cueing for grasp on utensils.    05/11/24 Pt missed therapy last week due to pt's wife's illness.  supine on mat with hotpack underback, 2 pillows under head and roll under knees PWR! up with min/ mod facilitation followed by cane exercises for chest press and shoulder flexion, bilateral shoulder abduction- then clapping hands together over chest, no twist due to hx of hip dislocation, no adverse reactions to heat x 15 mins, pain decreased Seated, PWR up for arms without leaning forward then reaching to side unilaterally with right and left UE's, mod v.c Standing a t countertop, stepping to left and right sides while holding onto ocuntetop then PWR! rock , then stepping while reaching for targets on cabinets, min-mod v.c for amplitude and posture, close supervision for balance PWR! hands for PWR! up, twist and step. Therapist reviewed dealing cards with thumb for left and right hands. Pt  was encouraged to perform coordination activities at  home. Therapist checked bilateral 9 hole peg test, see goals below.   PATIENT EDUCATION: Education  details:  progress towards goals, plans for renewal Person educated: Patient Education method: Explanation, Demonstration, Tactile cues, Verbal cues,  Education comprehension: verbalized understanding, returned demonstration, verbal cues required, and needs further education   HOME EXERCISE PROGRAM:  03/30/24-cane exercises in supine 04/02/24:  PWR! Hands, Coordination HEP with focus on large amplitude  04/09/24:  Strategies for eating and writing  GOALS: Goals reviewed with patient? Yes  SHORT TERM GOALS: Target date: 07/30/24   I with PD specific HEP Goal status: met, 04/29/24  2.  I with adapted strategies/AE to maximize safety and I with ADLs/IADLs. Goal status: ongoing  06/17/24  3.   Pt will demonstrate improved fine motor coordination for ADLs as evidenced by decreasing 9 hole peg test score for RUE to 2 mins or less without drops Reveised goal: Pt will demonstrate improved fine motor coordination for ADLs as evidenced by decreasing 9 hole peg test score for RUE to 1 min 15 secs with no more than 2 drops. Goal status:partially met 48 secs with drops  05/25/24,  not met 2 mins 22 secs- 1st trial several drops, 1 min 22 secs -06/14/24,  ongoing 06/17/24- 1 min 22 secs with several drops , continue with revised goal  4.   Pt will demonstrate improved fine motor coordination for ADLs as evidenced by decreasing 9 hole peg test score for LUE by 3 secs Baseline: LUE 43.83 secs Goal status:  met, 37.10 05/11/24  5.  Pt will demonstrate improved ease with feeding as evidenced by decreasing PPT#2 to 11.34 without drops Baseline:  Goal status: ongoing 04/06/24, 05/11/24- ongoing.  05/21/24 ongoing 16.31sec without drops, 05/25/24-15.33 secs  06/17/24 ongoing 15.81  16.  Pt will write a  sentence with 100% legibility and minimal decrease in letter size Goal status:  met 05/25/24 LONG TERM GOALS: Target date: 09/09/24  Pt will demonstrate improved ease with fastening buttons as  evidenced by decreasing 3 button/ unbutton time to :1 min 20 secs or less Baseline: 1 min 29 secs Goal status: 05/21/24 MET 56.19sec  2.  Pt will demonstrate ability to retrieve a lightweight object at 125 shoulder flexion and -15 elbow extension with RUE Goal status:ongoing 115, -25 06/17/24  3. Pt will demonstrate ability to retrieve a lightweight object at 130 shoulder flexion and -15 elbow extension with LUE Goal status: met 130, -15  4.  Pt will verbalize understanding of ways to prevent future PD related complications and PD community resources. Goal status:  ononging 06/17/24  5.   Improve PPT#4 to 22 secs or less for increased ease with dressing Baseline: 26.24 Goal status: ongoing 1 minute,  seated 06/17/24  6.  Pt will report dropping utensils. and cups less frequently Goal status:  ongoing, still dropping, reveiwed strategies 06/17/24   ASSESSMENT:  CLINICAL IMPRESSION: Pt is progressing towards goals. Today he reponded well to v.c for amplitude, posture and timing with functional activity    PERFORMANCE DEFICITS: in functional skills including ADLs, IADLs, coordination, dexterity, ROM, strength, flexibility, Fine motor control, Gross motor control, mobility, balance, decreased knowledge of precautions, decreased knowledge of use of DME, and UE functional use, , and psychosocial skills including coping strategies, environmental adaptation, habits, interpersonal interactions, and routines and behaviors.   IMPAIRMENTS: are limiting patient from ADLs, IADLs, rest and sleep, play, leisure, and social participation.   COMORBIDITIES:  may have co-morbidities  that affects occupational performance. Patient will benefit from skilled OT to address above impairments and improve overall function.  MODIFICATION OR ASSISTANCE TO COMPLETE EVALUATION: No modification of tasks or assist necessary to complete an evaluation.  OT OCCUPATIONAL PROFILE AND HISTORY: Detailed assessment: Review  of records and additional review of physical, cognitive, psychosocial history related to current functional performance.  CLINICAL DECISION MAKING: LOW - limited treatment options, no task modification necessary  REHAB POTENTIAL: Good  EVALUATION COMPLEXITY: Low   PLAN:  OT FREQUENCY: 2x/week  OT DURATION: 12 weeks- anticipate d/c after 8 weeks  PLANNED INTERVENTIONS: 97168 OT Re-evaluation, 97535 self care/ADL training, 02889 therapeutic exercise, 97530 therapeutic activity, 97112 neuromuscular re-education, 97140 manual therapy, 97035 ultrasound, 97018 paraffin, 02989 moist heat, 97010 cryotherapy, 97760 Orthotic Initial, 97763 Orthotic/Prosthetic subsequent, passive range of motion, balance training, functional mobility training, energy conservation, coping strategies training, patient/family education, and DME and/or AE instructions  RECOMMENDED OTHER SERVICES: n/a  CONSULTED AND AGREED WITH PLAN OF CARE: Patient and family adult nurse- wife Rock GORY FOR NEXT SESSION:   work towards unmet goals, ADL strategies, coordination  Hx of multiple hip dislocations, exercise caution with LE movement, twisting, no bending > 90*    Tell Rozelle, OTR/L  06/29/2024, 11:11 AM   "

## 2024-07-12 ENCOUNTER — Ambulatory Visit: Admitting: Physical Therapy

## 2024-07-12 ENCOUNTER — Encounter: Payer: Self-pay | Admitting: Physical Therapy

## 2024-07-12 ENCOUNTER — Other Ambulatory Visit: Payer: Self-pay

## 2024-07-12 ENCOUNTER — Ambulatory Visit: Attending: Neurology | Admitting: Occupational Therapy

## 2024-07-12 DIAGNOSIS — M6281 Muscle weakness (generalized): Secondary | ICD-10-CM | POA: Insufficient documentation

## 2024-07-12 DIAGNOSIS — R2681 Unsteadiness on feet: Secondary | ICD-10-CM

## 2024-07-12 DIAGNOSIS — R2689 Other abnormalities of gait and mobility: Secondary | ICD-10-CM

## 2024-07-12 DIAGNOSIS — R262 Difficulty in walking, not elsewhere classified: Secondary | ICD-10-CM

## 2024-07-12 DIAGNOSIS — M25621 Stiffness of right elbow, not elsewhere classified: Secondary | ICD-10-CM | POA: Diagnosis present

## 2024-07-12 DIAGNOSIS — R293 Abnormal posture: Secondary | ICD-10-CM

## 2024-07-12 DIAGNOSIS — M25622 Stiffness of left elbow, not elsewhere classified: Secondary | ICD-10-CM | POA: Insufficient documentation

## 2024-07-12 DIAGNOSIS — R278 Other lack of coordination: Secondary | ICD-10-CM | POA: Diagnosis present

## 2024-07-12 DIAGNOSIS — R29818 Other symptoms and signs involving the nervous system: Secondary | ICD-10-CM | POA: Insufficient documentation

## 2024-07-12 NOTE — Progress Notes (Signed)
 "                                                                                                                                                                                                                                              [facility] [address] [place], [place] [address]  Subjective:    Patient ID: Jim Carson is a 74 y.o. (DOB 16-Apr-1951) male. Vitals:   07/12/24 1703  BP: 108/66  Patient Position: Sitting  Pulse: 60  Temp: 97.5 F (36.4 C)  TempSrc: Temporal  Resp: 21  Height: 5' 8 (1.727 m)  Weight: 198 lb (89.8 kg)  SpO2: 98%  BMI (Calculated): 30.1  PainSc:   2  PainLoc: Hand Comment: right hand       Patient presents with   Hand Injury    Pt states that he fell approximately 2 weeks ago and he visited [facility] ED for the fall. On the way home his right hand began to hurt and swell shortly thereafter. Pt wishes to discuss having an xray on the affected area.    History of Present Illness The patient is a 74 year old male who presents for evaluation of right hand pain. He is accompanied by his wife.  He experienced a fall on 06/29/2024, which resulted in persistent swelling in his right hand that has been ongoing for 2 weeks. The swelling was worse initially but has not completely resolved. He reports pain upon movement of the hand but not when it is stationary. Despite the swelling, he retains the ability to lift objects. He has not sought orthopedic consultation for this issue. He reports no other areas of swelling. He recalls using his hand to brace himself during the fall, which also resulted in a head injury. He was subsequently evaluated at an emergency room where a CT scan of the spine and head was performed, yielding normal results. He is not currently on any anticoagulant therapy. He has been utilizing a wrist brace for support and applied ice to the area for the first 24 hours post-injury, followed by heat application,  which did not alleviate the symptoms.He is currently undergoing physical therapy.  Review of Systems  Constitutional:  Negative for chills and fever.  Respiratory:  Negative for apnea, cough, choking, chest tightness, shortness of breath, wheezing and stridor.   Cardiovascular:  Negative for chest pain, palpitations and leg swelling.  Musculoskeletal:  Positive for arthralgias. Negative for back pain, gait problem, joint swelling, myalgias, neck pain and neck stiffness.  Neurological:  Negative for dizziness, tremors, seizures, syncope, facial asymmetry, speech difficulty, weakness, light-headedness, numbness and headaches.    Objective:   Physical Exam Vitals and nursing note reviewed.  Constitutional:      General: He is not in acute distress.    Appearance: Normal appearance. He is not ill-appearing.  HENT:     Head: Normocephalic and atraumatic.     Right Ear: Tympanic membrane, ear canal and external ear normal.     Left Ear: Tympanic membrane, ear canal and external ear normal.     Nose: Nose normal.  Eyes:     General: Lids are normal.     Conjunctiva/sclera: Conjunctivae normal.     Pupils: Pupils are equal, round, and reactive to light.  Cardiovascular:     Rate and Rhythm: Normal rate and regular rhythm.     Pulses: Normal pulses.          Radial pulses are 2+ on the right side and 2+ on the left side.     Heart sounds: Normal heart sounds, S1 normal and S2 normal. Heart sounds not distant. No murmur heard.    Comments: 2+ pitting edema of right hand Musculoskeletal:        General: Normal range of motion.     Right hand: Swelling and tenderness present. Normal range of motion. Decreased strength. Normal sensation. Normal pulse.     Left hand: No swelling or tenderness. Normal range of motion. Normal strength. Normal sensation. Normal pulse.     Cervical back: Full passive range of motion without pain and normal range of motion.  Pulmonary:     Effort: Pulmonary effort  is normal. No respiratory distress.     Breath sounds: Normal breath sounds. No stridor or decreased air movement. No decreased breath sounds, wheezing, rhonchi or rales.  Abdominal:     General: Bowel sounds are normal.     Palpations: Abdomen is soft.  Skin:    General: Skin is warm and dry.  Neurological:     General: No focal deficit present.     Mental Status: He is alert and oriented to person, place, and time. Mental status is at baseline.  Psychiatric:        Attention and Perception: Attention normal.        Mood and Affect: Mood normal.        Speech: Speech normal.        Behavior: Behavior normal. Behavior is cooperative.        Thought Content: Thought content normal.        Judgment: Judgment normal.      Assessment / Plan:   Assessment 1. Localized swelling on right hand    Plan Assessment & Plan 1. Right hand edema: - Swelling has been ongoing for 2 weeks since the fall on 06/29/2024. Pain is present with movement. - An x-ray of the right hand will be ordered.   -If any abnormalities are detected, a referral to an orthopedic specialist will be considered. - He is advised to continue wearing the wrist brace daily, elevate the hand above heart level when at home, and apply ice to the affected area. Rest, ice, compression, and elevation (RICE) are recommended. -He should also persist with his physical therapy exercises.    Patient's Medications       *  Accurate as of July 12, 2024 10:22 PM. Reflects encounter med changes as of last refresh          Continued Medications      Instructions  Amantadine  HCl 100 MG tablet Commonly known as: SYMMETREL   100 mg, Oral, 2 times a day   aspirin  EC tablet Commonly known as: ECOTRIN LOW DOSE  81 mg, At bedtime as needed   * botulinum toxin type A 200 units injection Commonly known as: BOTOX  200 Units, Every 3 months   * botulinum toxin type A 200 units injection Commonly known as: BOTOX  200 Units,  Once   * carbidopa -levodopa  25-100 MG per disintegrating tablet Commonly known as: PARCOPA   4 tablets, Oral, 3 times daily   * carbidopa -levodopa  25-100 mg ER tablet Commonly known as: SINEMET  CR  1 tablet, Oral, At bedtime   cholecalciferol 125 MCG (5000 UT) capsule Commonly known as: D-3-5,D-5000  1 capsule, Daily   cyanocobalamin  500 MCG tablet  500 mcg, Daily   docusate sodium  capsule Commonly known as: COLACE,DOK,DOCQLACE  100 mg, Daily as needed   finasteride 5 mg tablet Commonly known as: PROSCAR  5 mg, Oral, Daily   HYDROcodone -acetaminophen  7.5-325 mg per tablet Commonly known as: NORCO  1 tablet, Every 6 hours as needed   levothyroxine  sodium 75 mcg tablet Commonly known as: SYNTHROID ,LEVOTHROID,LEVOXYL   75 mcg, Oral, Daily   linaclotide  290 mcg capsule Commonly known as: LINZESS   290 mcg, Daily as needed   losartan -hydrochlorothiazide  100-25 MG per tablet Commonly known as: HYZAAR  1 tablet, Oral, Daily   methocarbamol  500 mg tablet Commonly known as: ROBAXIN   500 mg, Every 6 hours as needed   omeprazole 20 mg capsule Commonly known as: PRILOSEC  20 mg, Daily as needed   ondansetron  4 mg tablet Commonly known as: ZOFRAN   4 mg, Every 6 hours as needed   * polyethylene glycol 17 GM/SCOOP powder Commonly known as: MIRALAX ,GAVILAX,CLEARLAX  17 g, As needed   * polyethylene glycol 17 g packet Commonly known as: MIRALAX   17 g, Daily   pramipexole  0.5 MG tablet Commonly known as: MIRAPEX   0.5 mg, Oral, 2 times a day      * * This list has 6 medication(s) that are the same as other medications prescribed for you. Read the directions carefully, and ask your doctor or other care provider to review them with you.          Modified Medications      Instructions  diclofenac  sodium 1 % gel Commonly known as: VOLTAREN  What changed: Another medication with the same name was removed. Continue taking this medication, and follow the directions you  see here. Changed by: Hyla Merino, NP  1 g, As needed   ipratropium 0.06% nasal spray Commonly known as: ATROVENT What changed: Another medication with the same name was removed. Continue taking this medication, and follow the directions you see here. Changed by: Hyla Merino, NP  2 sprays, 3 times daily         Follow up if symptoms worsen or fail to improve.  Risks, benefits, and alternatives of the medications and treatment plan prescribed today were discussed, and patient expressed understanding. Plan follow-up as discussed or as needed if any worsening symptoms or change in condition.        I have reviewed the information contained in this note and personally verified its accuracy.  I obtained the history of present illness and personally performed the physical exam.  AI technology was used to create to visit note. Consent was obtained from patient/caregiver prior to its use.    Hyla Merino, NP      *Some images could not be shown."

## 2024-07-12 NOTE — Therapy (Signed)
 " OUTPATIENT OCCUPATIONAL THERAPY PARKINSON'S TREATMENT  Patient Name: Jim Carson MRN: [id number] DOB:04-Feb-1951, 74 y.o., male Today's Date: 07/12/2024  PCP: Jim Slough, Carson REFERRING PROVIDER: Georgina Collar [facility]  END OF SESSION:  OT End of Session - 07/12/24 1111     Visit Number 16    Number of Visits 36    Date for Recertification  09/09/24    Authorization Type Medicare, add KX    Authorization Time Period 12 weeks, anticipate d/c after 8 weeks    Authorization - Visit Number 16    Progress Note Due on Visit 20    OT Start Time 1103    OT Stop Time 1146    OT Time Calculation (min) 43 min    Activity Tolerance Patient tolerated treatment well    Behavior During Therapy WFL for tasks assessed/performed                      Past Medical History:  Diagnosis Date   Abnormal gait    due to parkinson's,  uses cane   Absolute anemia 03/13/2023   Allergic rhinitis    Allergy Spring 1998   Allergic to dustmits, dog hair and roaches.   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   GERD (gastroesophageal reflux disease)    History of multiple concussions    per pt as teen playing football, no residual   Hypothyroidism    followed by pcp   Left shoulder pain    Neuromuscular disorder (HCC) 07/04/2012   Parkinson's Disease   OA (osteoarthritis)    OSA on CPAP    Parkinson disease Jim Carson)    neurologist-- Jim Carson  @Jim  Carson   Pneumonia    Sleep apnea    Vitamin B12 deficiency    Vitamin D  deficiency    Wears glasses    Past Surgical History:  Procedure Laterality Date   APPENDECTOMY  07/1964   APPENDECTOMY     CHOLECYSTECTOMY     JOINT REPLACEMENT  March 2019 and again on 09/10/2021   Left hip twice.  2nd time due to 3 dislocations   LAPAROSCOPIC CHOLECYSTECTOMY  01-21-2017   @[facility] , [place]   lower  back surgery      POSTERIOR CERVICAL FUSION/FORAMINOTOMY     POSTERIOR FUSION CERVICAL SPINE  08-15-2011   @[facility] , [place]   w/ laminectomy and decompression-- C3 -- T1   POSTERIOR LAMINECTOMY / DECOMPRESSION LUMBAR SPINE  07-30-2013   @[facility] ,    bilateral T11 - 12,  L2 --5   REMOVAL LEFT T1 PARASPINOUS MASS  02-12-2016   @[facility] ,  [place]   desmoid fibromatosis   SHOULDER ARTHROSCOPY WITH ROTATOR CUFF REPAIR Left 12/15/2018   Procedure: SHOULDER ARTHROSCOPY, biceps tenodesis labored Debridement, submacromial decompression;  Surgeon: Jim Lamar, Carson;  Location: [facility];  Service: Orthopedics;  Laterality: Left;  interscalene block   SPINE SURGERY     TOTAL HIP ARTHROPLASTY Left 09/18/2017   @[facility] , [place]   TOTAL HIP REVISION Left 09/10/2021   Procedure: Left hip acetabular versus total hip arthroplasty revision, constrained liner;  Surgeon: Jim Lerner, Carson;  Location: [facility];  Service: Orthopedics;  Laterality: Left;   TOTAL HIP REVISION Left 10/01/2023   Procedure: Left hip constrained liner revision;  Surgeon: Jim Lerner, Carson;  Location:  [facility];  Service: Orthopedics;  Laterality: Left;    Patient Active Problem List   Diagnosis Date Noted   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   Thrombocytopenia 03/17/2023   Absolute anemia 03/13/2023   Family history of prostate cancer 03/13/2023   History of elevated PSA 03/13/2023   AV block, 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   Drug induced constipation 12/30/2022   Acquired hypothyroidism 12/30/2022   Primary osteoarthritis of left hip 05/18/2021   PD (Parkinson's disease) (HCC) 05/18/2021   History of gastroesophageal reflux (GERD) 05/18/2021   OSA (obstructive sleep apnea) 05/18/2021   Essential hypertension 05/18/2021    ONSET DATE: referral 12/09/23  REFERRING DIAG:  Diagnosis  G20.B2 (ICD-10-CM) - Parkinson's disease with dyskinesia, with fluctuations     THERAPY DIAG:  Other symptoms and signs involving the nervous system  Other lack of coordination  Stiffness of left elbow, not elsewhere classified  Stiffness of right elbow, not elsewhere classified  Muscle weakness (generalized)  Abnormal posture  Other abnormalities of gait and mobility  Unsteadiness on feet  Rationale for Evaluation and Treatment: Rehabilitation  SUBJECTIVE:   SUBJECTIVE STATEMENT: Pt reports he had a nice Christmas  [name]  PERTINENT HISTORY: Pt reports he had a L hip replacement in 2019 and dislocated it 3 times. Re did it 2023, had a fall and jammed it and had to revise it 10/01/23. Was told not to go too heavy over 110 lbs on the leg press. Was told not to cross his legs  PMH: Parkinson's Disease, hypothyroidism, hx of multiple concussions, GERD, benign essential HTN, abnormal gait, L THA 2019, removal of T1 paraspinous mass 2017, T11-12 and L2-5 lumbar lami/decompression 2015, cervical fusion C3-T1 2013, hx of shoulder surgery June 2020, hip surgery revision 10/01/23 after multiple dislocations  PRECAUTIONS:follow hip precautions avoid bending greater than 90, avoid rotating hip/ foot inwards/ outwards, do not cross legs Other: hip revision, told not to cross legs  WEIGHT BEARING RESTRICTIONS: No  PAIN:  Are you having pain? Yes: NPRS scale: 0-2/10 Pain location: hip, wrist Pain description: aching Aggravating factors: wrist extension Relieving factors: rest  FALLS: Has patient fallen in last 6 months? Yes. Number of falls 3, Pt saw PT to address  LIVING ENVIRONMENT: Lives with: lives with their family and lives with their spouse Lives in: House/apartment  Has following equipment at home: Environmental Consultant - 4 wheeled and shower chair  PLOF: Independent with basic ADLs  PATIENT GOALS: improve grip and coordination, wants to play guitar better  OBJECTIVE:  Note: Objective measures were completed at Evaluation unless otherwise noted.  HAND  DOMINANCE: Left  ADLs:  Overall ADLs: increased time required Transfers/ambulation related to ADLs: Eating: difficulty holding knife to cut food  Grooming: mod I UB Dressing:needs assist with buttons at times LB Dressing: min A at times with socks Toileting: mod  Bathing: mod I Tub Shower transfers: mod I, walk in shower with seat Equipment: Shower seat with back and Walk in shower Has an up walker at home IADLs:  Light housekeeping: Pt does laundry, and cleans up after meals  Meal Prep: wife prepares Community mobility: mod I with rollator Medication management: keeps up with meds Financial management: keeps up with finanaces Handwriting: 50% legible and Moderate micrographia  MOBILITY STATUS: Independent  POSTURE COMMENTS:  rounded shoulders, forward head, and flexed trunk    FUNCTIONAL OUTCOME MEASURES: Fastening/unfastening 3 buttons: 1 min 29 secs Physical performance test: PPT#2 (simulated  eating) 14.34 with 1 drop & PPT#4 (donning/doffing jacket): NT  COORDINATION: 9 Hole Peg test: Right: 2 mins.06 sec multiple drops; Left: 43.83 sec Box and Blocks:  Right 40 blocks, Left 47blocks  UE ROM:  RUE shoulder flexion 120, elbow extension -20, LUE shoulder flexion 130, elbow extension -20, decreased bilateral grip/ composite flexion due to arthritis  COGNITION: Overall cognitive status: Within functional limits for tasks assessed  OBSERVATIONS: Bradykinesia                                                                                                                    TREATMENT DATE:  07/12/24- Pt arrived today s/p fall with swelling in R hand. Therapist discussed with pt/ wife recommendation that pt has his hand x-rayed as swelling still persists nearly 2 weeks after his accient.Pt/ wife verbalize understanding. PWR! rock and step at countertop, as well as marching, min v.c for amplitude. Pt was cued to avoid heavy weightbearing through RUE.  Stepping to left and  right sides to flip playing cards, min-mod v.c for amplitude. Seated cane exercises for shoulder flexion and chest press, min v.c Fine motor coordiantion task with LUE to copy small peg design, mod difficulty and increased time, min v.c for flicks. while ice pack applied to R hand x approx 7 mins, no adverse reactions.     06/29/24-UBE x 6 mins for conditioning Standing with back against wall hands on rollator in front then with superivion/ min A from therapist standing with palms against wall for PWR! up while squeezing shoulder blades back, then arms in abduction on wall with supevision-min A, v.c and facilitation for upright posture PWR! rock at [employer], modified quadraped at [employer] 10-20 reps each Stepping forwards and backwards at countertop, rocking forwards and backwards for modified quaraped PWR! rock, marching at [employer], min-mod v.c these activities and min facilitlitation for amplitude and posture. Dynamic step and reach to flip playing cards, min-mod facillitation. PWR! hands basic 4 min v.c disccused safety for tying shoes, recommended elastic laces or slip on shoes due to hip prec. Placing grooved pegs into pegboard with RUE mod difficulty/ v.c for icnreased coordination   06/17/24 Standing with back against wall hands on rollator in front then with min A from therapist standing with palms against wall for PWR! up while squeezing shoulder blades back, then arms in abduction on wall with min A, v.c and facilitation for upright posture PWR! rock at [employer], modified quadraped at [employer] 10-20 reps each Seate closed chain chest press and shoulder flexion, mod v.c Therapist checked progress towards goals see goals below. 06/14/24 UBE x 6 mins level 1 for conditioning  Standing with back against wall hands on rollator in front then with min A from therapist standing with palms against wall for PWR! up while squeezing shoulder blades back, then arms in abduction on wall  with min A, v.c and facilitation for upright posture PWR! rock at [employer], modified quadraped at [employer] 10-20 reps each Dynamic step and reach to flip  playing cards to left and right sides, min facilitation and assist for balance checked 9 hole peg test and PPT#2 simulated eating in prep for renewal next visit. Therapist recommends pt works more with flipping playing cards and coins at home for improved coordination  Pt reports participating in PD cycle, therapist recommends pt does not participate in rock steady at home due to fall risk. Pt to call [facility] for a referral to PT.  06/03/24- UBE x 5 mins level 1 for conditioning  Standing with back against wall hands on rollator in front then with min A from therapist standing with palms against wall for PWR! up while squeezing shoulder blades back, then arms in abduction on wall with min A, v.c and facilitation for upright posture PWR! rock at [employer], modified quadraped at [employer] 10-20 reps each Sit to stand with emphasis on calibration, PWR! up for sit to stand, and focus on controlled sit Naviagiting gym with big steps and upright posture, mod v.c   05/31/24 UBE x 6 mins level 1 for conditioning min v.c to maintain 40 rpm Standing to perform PWR! rock and step  while flipping playing cards at countertop, marching min guard for balance, min v.c for amplitude Seated PWR! up without rocking forwards, then closed chain shoulder flexion with cane, chest press and abduction,  min v.c Standing with back against wall hands on rollator in front then with min A from therapist standing with palms against wall for PWR! up while squeezing shoulder blades back, then arms in abduction on wall with min A, v.c and facilitation for upright posture Pt's posture was improved at end of session. Discussion with pt regarding safety for turns and sit to stand as pt moves very quickly and is at risk for falls   05/25/24-Therapist checked progress in prep for  progress note, see short term goals below. Handwriting activity for continuous l, continuouse followed by writing activity, pt was able to write a sentence with 100% legibility and minimal decrease in letter size. PWR! hands for PWR! up prior to task discussed importance of avoiding twisting at hips to avoid hip dislocation.  Standing to perform PWR! rock and step at countertop, marching min guard for balance, min v.c for amplitude Seated PWR! up without rocking forwards, then closed chain shoulder flexion with cane, min v.c discussed avoiding biceps curls at the gym due to likelihood of making elbows tight and difficulty with elbow extension UBE x 5 mins level 1 for conditioning  05/21/24:  Closed-chain shoulder flex in sitting with BUEs with foam noodle and min cueing for posture and elbow ext followed by diagonals to each side with gentle wt. Shift and trunk rotation with min cueing.  PWR! Hands x10 with min cueing for large amplitude movements.  Checked shoulder/elbow ROM for functional reach (see goals section below).  Practiced functional reaching with each UE to grasp/release cylinder objects incorporating reaching laterally and across body with min cueing to slow down, large amplitude,  and track hand/object with eyes to decr drops/knocking items over.  Reviewed strategies for fastening/unfastening buttons and practiced with min v.c. (also checked goal--see below).    Practiced writing with min cueing for large amplitude.  Pt wrote 8 sentences with approx 95% legibility and min decr in size.    Checked PPT#2 (see below) with no drops, min cueing for grasp on utensils.    05/11/24 Pt missed therapy last week due to pt's wife's illness.  supine on mat with hotpack underback, 2 pillows under  head and roll under knees PWR! up with min/ mod facilitation followed by cane exercises for chest press and shoulder flexion, bilateral shoulder abduction- then clapping hands together over chest,  no twist due to hx of hip dislocation, no adverse reactions to heat x 15 mins, pain decreased Seated, PWR up for arms without leaning forward then reaching to side unilaterally with right and left UE's, mod v.c Standing a t countertop, stepping to left and right sides while holding onto ocuntetop then PWR! rock , then stepping while reaching for targets on cabinets, min-mod v.c for amplitude and posture, close supervision for balance PWR! hands for PWR! up, twist and step. Therapist reviewed dealing cards with thumb for left and right hands. Pt was encouraged to perform coordination activities at  home. Therapist checked bilateral 9 hole peg test, see goals below.   PATIENT EDUCATION: Education details: recommendation for x ray to hand  Person educated: Patient Education method: Explanation, Demonstration, Tactile cues, Verbal cues,  Education comprehension: verbalized understanding, returned demonstration, verbal cues required, and needs further education   HOME EXERCISE PROGRAM:  03/30/24-cane exercises in supine 04/02/24:  PWR! Hands, Coordination HEP with focus on large amplitude  04/09/24:  Strategies for eating and writing  GOALS: Goals reviewed with patient? Yes  SHORT TERM GOALS: Target date: 07/30/24   I with PD specific HEP Goal status: met, 04/29/24  2.  I with adapted strategies/AE to maximize safety and I with ADLs/IADLs. Goal status: ongoing  06/17/24  3.   Pt will demonstrate improved fine motor coordination for ADLs as evidenced by decreasing 9 hole peg test score for RUE to 2 mins or less without drops Reveised goal: Pt will demonstrate improved fine motor coordination for ADLs as evidenced by decreasing 9 hole peg test score for RUE to 1 min 15 secs with no more than 2 drops. Goal status:partially met 48 secs with drops  05/25/24,  not met 2 mins 22 secs- 1st trial several drops, 1 min 22 secs -06/14/24,  ongoing 06/17/24- 1 min 22 secs with several drops ,  continue with revised goal  4.   Pt will demonstrate improved fine motor coordination for ADLs as evidenced by decreasing 9 hole peg test score for LUE by 3 secs Baseline: LUE 43.83 secs Goal status:  met, 37.10 05/11/24  5.  Pt will demonstrate improved ease with feeding as evidenced by decreasing PPT#2 to 11.34 without drops Baseline:  Goal status: ongoing 04/06/24, 05/11/24- ongoing.  05/21/24 ongoing 16.31sec without drops, 05/25/24-15.33 secs  06/17/24 ongoing 15.81  16.  Pt will write a  sentence with 100% legibility and minimal decrease in letter size Goal status:  met 05/25/24 LONG TERM GOALS: Target date: 09/09/24  Pt will demonstrate improved ease with fastening buttons as evidenced by decreasing 3 button/ unbutton time to :1 min 20 secs or less Baseline: 1 min 29 secs Goal status: 05/21/24 MET 56.19sec  2.  Pt will demonstrate ability to retrieve a lightweight object at 125 shoulder flexion and -15 elbow extension with RUE Goal status:ongoing 115, -25 06/17/24  3. Pt will demonstrate ability to retrieve a lightweight object at 130 shoulder flexion and -15 elbow extension with LUE Goal status: met 130, -15  4.  Pt will verbalize understanding of ways to prevent future PD related complications and PD community resources. Goal status:  ononging 06/17/24  5.   Improve PPT#4 to 22 secs or less for increased ease with dressing Baseline: 26.24 Goal status: ongoing 1 minute,  seated 06/17/24  6.  Pt will report dropping utensils. and cups less frequently Goal status:  ongoing, still dropping, reveiwed strategies 06/17/24   ASSESSMENT:  CLINICAL IMPRESSION: Pt is progressing towards goals. Today he was limited by RUE pain and swelling from fall. Therapist recommends pt has x-ray of hand.   PERFORMANCE DEFICITS: in functional skills including ADLs, IADLs, coordination, dexterity, ROM, strength, flexibility, Fine motor control, Gross motor control, mobility, balance, decreased  knowledge of precautions, decreased knowledge of use of DME, and UE functional use, , and psychosocial skills including coping strategies, environmental adaptation, habits, interpersonal interactions, and routines and behaviors.   IMPAIRMENTS: are limiting patient from ADLs, IADLs, rest and sleep, play, leisure, and social participation.   COMORBIDITIES:  may have co-morbidities  that affects occupational performance. Patient will benefit from skilled OT to address above impairments and improve overall function.  MODIFICATION OR ASSISTANCE TO COMPLETE EVALUATION: No modification of tasks or assist necessary to complete an evaluation.  OT OCCUPATIONAL PROFILE AND HISTORY: Detailed assessment: Review of records and additional review of physical, cognitive, psychosocial history related to current functional performance.  CLINICAL DECISION MAKING: LOW - limited treatment options, no task modification necessary  REHAB POTENTIAL: Good  EVALUATION COMPLEXITY: Low   PLAN:  OT FREQUENCY: 2x/week  OT DURATION: 12 weeks- anticipate d/c after 8 weeks  PLANNED INTERVENTIONS: 97168 OT Re-evaluation, 97535 self care/ADL training, 02889 therapeutic exercise, 97530 therapeutic activity, 97112 neuromuscular re-education, 97140 manual therapy, 97035 ultrasound, 97018 paraffin, 02989 moist heat, 97010 cryotherapy, 97760 Orthotic Initial, 97763 Orthotic/Prosthetic subsequent, passive range of motion, balance training, functional mobility training, energy conservation, coping strategies training, patient/family education, and DME and/or AE instructions  RECOMMENDED OTHER SERVICES: n/a  CONSULTED AND AGREED WITH PLAN OF CARE: Patient and family adult nurse- wife Rock  PLAN FOR NEXT SESSION:   work towards unmet goals, ADL strategies, coordination, address RUe pain/ selling after pt sees [facility]  Hx of multiple hip dislocations, exercise caution with LE movement, twisting, no bending >  90*    Tamya Denardo, OTR/L  07/12/2024, 4:17 PM   "

## 2024-07-12 NOTE — Therapy (Signed)
 " OUTPATIENT PHYSICAL THERAPY LOWER EXTREMITY EVALUATION   Patient Name: Jim Carson MRN: [id number] DOB:1950/07/13, 74 y.o., male Today's Date: 07/12/2024  END OF SESSION:  PT End of Session - 07/12/24 1035     Visit Number 1    Number of Visits 17    Date for Recertification  09/06/24    Authorization Type MCR/AARP    Progress Note Due on Visit 10    PT Start Time 1025   pt checked in for eval late   PT Stop Time 1055    PT Time Calculation (min) 30 min    Activity Tolerance Patient tolerated treatment well    Behavior During Therapy [facility] for tasks assessed/performed          Past Medical History:  Diagnosis Date   Abnormal gait    due to parkinson's,  uses cane   Absolute anemia 03/13/2023   Allergic rhinitis    Allergy Spring 1998   Allergic to dustmits, dog hair and roaches.   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 glendia  @[facility]  Neurology   Pneumonia    Sleep apnea    Vitamin B12 deficiency    Vitamin D  deficiency    Wears glasses    Past Surgical History:  Procedure Laterality Date   APPENDECTOMY  07/1964   APPENDECTOMY     CHOLECYSTECTOMY     JOINT REPLACEMENT  March 2019 and again on 09/10/2021   Left hip twice.  2nd time due to 3 dislocations   LAPAROSCOPIC CHOLECYSTECTOMY  01-21-2017   @[facility] , [place]   lower back surgery      POSTERIOR CERVICAL FUSION/FORAMINOTOMY     POSTERIOR FUSION CERVICAL SPINE  08-15-2011   @[facility] , [place]   w/ laminectomy and decompression-- C3 -- T1    POSTERIOR LAMINECTOMY / DECOMPRESSION LUMBAR SPINE  07-30-2013   @[facility] , [place]   bilateral T11 - 12,  L2 --5   REMOVAL LEFT T1 PARASPINOUS MASS  02-12-2016   @[facility] ,  [place]   desmoid fibromatosis   SHOULDER ARTHROSCOPY WITH ROTATOR CUFF REPAIR Left 12/15/2018   Procedure: SHOULDER ARTHROSCOPY, biceps tenodesis labored Debridement, submacromial decompression;  Surgeon: Gerome Lamar, MD;  Location: [facility];  Service: Orthopedics;  Laterality: Left;  interscalene block   SPINE SURGERY     TOTAL HIP ARTHROPLASTY Left 09/18/2017   @[facility] , [place]   TOTAL HIP REVISION Left 09/10/2021   Procedure: Left hip acetabular versus total hip arthroplasty revision, constrained liner;  Surgeon: Melodi Lerner, MD;  Location: [facility];  Service: Orthopedics;  Laterality: Left;   TOTAL HIP REVISION Left 10/01/2023   Procedure: Left hip constrained liner revision;  Surgeon: Melodi Lerner, MD;  Location: [facility];  Service: Orthopedics;  Laterality: Left;    Patient Active Problem List   Diagnosis Date Noted   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   Thrombocytopenia 03/17/2023   Absolute anemia  03/13/2023   Family history of prostate cancer 03/13/2023   History of elevated PSA 03/13/2023   AV block, 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   Drug induced constipation 12/30/2022   Acquired hypothyroidism 12/30/2022   Primary osteoarthritis of left hip 05/18/2021   PD (Parkinson's disease) (HCC) 05/18/2021   History of gastroesophageal reflux (GERD) 05/18/2021   OSA (obstructive sleep apnea) 05/18/2021   Essential hypertension 05/18/2021    PCP: Abran Slough MD   REFERRING PROVIDER: Glendia Ann CROME, MD  REFERRING DIAG: Parkinsons G20.A1   THERAPY DIAG:  Muscle weakness (generalized)  Abnormal posture  Other abnormalities of gait and mobility  Unsteadiness on feet  Difficulty in walking, not elsewhere classified  Rationale for  Evaluation and Treatment: Rehabilitation  ONSET DATE: chronic   SUBJECTIVE:   SUBJECTIVE STATEMENT:  Had a fall at the end of December, was going into the garage at family's house and fell. I don't remember what happened but family said that I fell when I whipped around to shut the door. Don't remember a 2 hour window from when I fell, woke up in the ED. Had my hip surgery in 2019, it dislocated 3 times over the years and I had it again in 2023 and then I fell and jammed it and had to have another surgery on it April last year. Last time it dislocated was 2022. In terms of Parkinsons, its hard for me to walk without a walker and getting up can be difficult. If I step backwards, I can tend to stumble backward. Usually I fall forward if I'm going to fall. I'm getting stiffer and I need to do more stretches, I'm definitely getting weaker. Taking Levadopa, Amantadine , and Mirapex . [facility] but not taking any PD specific exercise courses. Was doing [employer] but need to sit for it now due to balance.   PERTINENT HISTORY: See above  PAIN:  Are you having pain? Yes: NPRS scale: 2/10 Pain location: R hand  Pain description: sore  Aggravating factors: sticking hand out to catch myself  Relieving factors: movement/exercise, heat   PRECAUTIONS: Fall and Other: hx of posterior THA with multiple dislocations, R ankle rolls easily    RED FLAGS: None   WEIGHT BEARING RESTRICTIONS: No  FALLS:  Has patient fallen in last 6 months? Yes. Number of falls 2; FOF (+)  LIVING ENVIRONMENT: Lives with: lives with their family Lives in: House/apartment   OCCUPATION: retired  PLOF: Independent, Independent with basic ADLs, Independent with gait, and Independent with transfers  PATIENT GOALS: improve balance and strength, no more falls, be able to stretch more   NEXT MD VISIT: MD late January   OBJECTIVE:  Note: Objective measures were completed at Evaluation unless  otherwise noted.    PATIENT SURVEYS:  PSFS: THE PATIENT SPECIFIC FUNCTIONAL SCALE  Place score of 0-10 (0 = unable to perform activity and 10 = able to perform activity at the same level as before injury or problem)  Activity Date: 07/12/24 eval     Walking around  8    2. Getting dressed  8    3. Balance 5    4.      Total Score 7      Total Score = Sum of activity scores/number of activities  Minimally Detectable Change: 3 points (for single activity); 2 points (for average score)  [facility] (nd). The Patient Specific  Functional Scale . Retrieved from [email or web address]pt   COGNITION: Overall cognitive status: Within functional limits for tasks assessed     SENSATION: Bottom of R foot is numb, L is OK   EDEMA:   R UE swollen, no significant bruising noted       LOWER EXTREMITY MMT:  MMT Right eval Left eval  Hip flexion 4+ 4+  Hip extension    Hip abduction 4- best seated  4- best seated   Hip adduction    Hip internal rotation    Hip external rotation    Knee flexion 4+ 4+  Knee extension 5 5  Ankle dorsiflexion    Ankle plantarflexion    Ankle inversion    Ankle eversion     (Blank rows = not tested)   FUNCTIONAL TESTS:  5 times sit to stand: 22.9 seconds, UEs on thighs and one posterior LOB back to chair  Timed up and go (TUG): 14.4 seconds 4WW  2 minute walk test: 265ft 4WW   GAIT: Distance walked: 279ft Assistive device utilized: [employer]ant - 4 wheeled Level of assistance: SBA Comments: flexed at hips, forward head, tends to push 4WW too far ahead of him, scissors when not cued, L toe catches quite a bit if he is not consciously dorsiflexing ankle during gait cycle                                                                                                                                 TREATMENT DATE:   07/12/24  Eval, POC     PATIENT EDUCATION:  Education details:  exam findings, POC, HEP, encouraged him to start wearing toe up brace for L foot again  Person educated: Patient Education method: Explanation Education comprehension: verbalized understanding and returned demonstration  HOME EXERCISE PROGRAM: TBD   ASSESSMENT:  CLINICAL IMPRESSION: Patient is a 74 y.o. M who was seen today for physical therapy evaluation and treatment for skilled PT care for Parkinsons disease. Objectives as above- he is a very high fall risk and actually had a major fall with 2 hour LOC at the end of December. Definitely with impaired safety awareness and insight into functional deficits. Will benefit from skilled PT services to address all findings, reduce fall risk, and assist in return to optimal level of function.   OBJECTIVE IMPAIRMENTS: Abnormal gait, decreased activity tolerance, decreased balance, decreased coordination, decreased knowledge of use of DME, decreased mobility, difficulty walking, decreased strength, decreased safety awareness, increased muscle spasms, impaired sensation, impaired UE functional use, postural dysfunction, and pain.   ACTIVITY LIMITATIONS: standing, squatting, stairs, transfers, bed mobility, and locomotion level  PARTICIPATION LIMITATIONS: meal prep, cleaning, laundry, shopping, community activity, yard work, and church  PERSONAL FACTORS: Age, Behavior pattern, Education, Fitness, Past/current experiences, Social background, and Time since onset of injury/illness/exacerbation are also affecting patient's functional outcome.   REHAB POTENTIAL: Good  CLINICAL DECISION MAKING: Stable/uncomplicated  EVALUATION COMPLEXITY: Low   GOALS:  Goals reviewed with patient? No  SHORT TERM GOALS: Target date: 08/09/2024   Will be compliant with appropriate progressive HEP  Baseline: Goal status: INITIAL  2.  Will be compliant with brace wear L LE to improve gait pattern  Baseline:  Goal status: INITIAL  3.  Will be able to complete 5xSTS  in 15 seconds or less no posterior LOB  Baseline:  Goal status: INITIAL    LONG TERM GOALS: Target date: 09/06/2024    MMT to be 5/5 all tested groups  Baseline:  Goal status: INITIAL  2.  Will ambulate at least 539ft in to show improved functional activity tolerance  Baseline:  Goal status: INITIAL  3.  Will score at least 48 on Berg to show reduced fall risk  Baseline:  Goal status: INITIAL  4.  Will be independent with Parkinsons based exercise programming in the gym vs group class settings in order to facilitation independence in managing condition  Baseline:  Goal status: INITIAL  5.  PSFS to be at least 9 Baseline:  Goal status: INITIAL     PLAN:  PT FREQUENCY: 2x/week  PT DURATION: 8 weeks  PLANNED INTERVENTIONS: 97750- Physical Performance Testing, 97110-Therapeutic exercises, 97530- Therapeutic activity, [id number]- Neuromuscular re-education, 97535- Self Care, [id number]- Manual therapy, [id number]- Gait training, and [phone]- Aquatic Therapy  PLAN FOR NEXT SESSION: needs HEP; work on [employer] inspired movements primarily, also balance and general strength. Be mindful of R hand pain after fall as well as hx of multiple hip dislocations  Josette Rough, PT, DPT 07/12/2024 11:22 AM  "

## 2024-07-13 NOTE — Progress Notes (Unsigned)
 "  Office Visit Note  Patient: Jim Carson             Date of Birth: [birth year]           MRN: [id number]             PCP: Abran Jon CROME, MD Referring: Abran Jon CROME, MD Visit Date: 07/27/2024 Occupation: Data Unavailable  Subjective:  No chief complaint on file.   History of Present Illness: Jim Carson is a 74 y.o. male ***     Activities of Daily Living:  Patient reports morning stiffness for *** {minute/hour:19697}.   Patient {ACTIONS;DENIES/REPORTS:21021675::Denies} nocturnal pain.  Difficulty dressing/grooming: {ACTIONS;DENIES/REPORTS:21021675::Denies} Difficulty climbing stairs: {ACTIONS;DENIES/REPORTS:21021675::Denies} Difficulty getting out of chair: {ACTIONS;DENIES/REPORTS:21021675::Denies} Difficulty using hands for taps, buttons, cutlery, and/or writing: {ACTIONS;DENIES/REPORTS:21021675::Denies}  No Rheumatology ROS completed.   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   Benign essential hypertension    Cancer (HCC)    hx of skin cancer   Cataract    Chronic constipation    Deviated septum    Failed total hip arthroplasty with  dislocation 09/10/2021   GERD (gastroesophageal reflux disease)    History of multiple concussions    per pt as teen playing football, no residual   Hypothyroidism    followed by pcp   Left shoulder pain    Neuromuscular disorder (HCC) 07/04/2012   Parkinson's Disease   OA (osteoarthritis)    OSA on CPAP    Parkinson disease [facility])    neurologist-- dr b. scott  @[facility]  [facility]urology   Pneumonia    Sleep apnea    Vitamin B12 deficiency    Vitamin D  deficiency    Wears glasses     Family History  Problem Relation Age of Onset   Heart disease Mother    Alcohol abuse Father    Prostate cancer Brother    Cancer Brother    Past Surgical History:  Procedure Laterality Date   APPENDECTOMY  07/1964   APPENDECTOMY     CHOLECYSTECTOMY     JOINT REPLACEMENT  March 2019 and again on 09/10/2021   Left hip twice.  2nd time due to 3 dislocations   LAPAROSCOPIC CHOLECYSTECTOMY  01-21-2017   @[facility] , [place]   lower back surgery      POSTERIOR CERVICAL FUSION/FORAMINOTOMY     POSTERIOR FUSION CERVICAL SPINE  08-15-2011   @[facility] , [place]   w/ laminectomy and decompression-- C3 -- T1   POSTERIOR LAMINECTOMY / DECOMPRESSION LUMBAR SPINE  07-30-2013   @[facility] , [place]   bilateral T11 - 12,  L2 --5   REMOVAL LEFT T1 PARASPINOUS MASS  02-12-2016   @[facility] ,  [place]  desmoid fibromatosis   SHOULDER ARTHROSCOPY WITH ROTATOR CUFF REPAIR Left 12/15/2018   Procedure: SHOULDER ARTHROSCOPY, biceps tenodesis labored Debridement, submacromial decompression;  Surgeon: Gerome Charleston, MD;  Location: [facility];  Service: Orthopedics;  Laterality: Left;  interscalene block   SPINE SURGERY     TOTAL HIP ARTHROPLASTY Left 09/18/2017   @[facility] , [place]   TOTAL HIP REVISION Left 09/10/2021   Procedure: Left hip acetabular versus total hip arthroplasty revision, constrained liner;  Surgeon: Melodi Lerner, MD;  Location: [facility];  Service: Orthopedics;  Laterality: Left;   TOTAL HIP REVISION Left 10/01/2023    Procedure: Left hip constrained liner revision;  Surgeon: Melodi Lerner, MD;  Location: [facility];  Service: Orthopedics;  Laterality: Left;    Social History[1] Social History   Social History Narrative   ** Merged History Encounter **         Immunization History  Administered Date(s) Administered   Fluad Quad(high Dose 65+) 03/14/2021   Gamma Globulin 03/08/1993, 07/24/1993, 11/21/1993, 05/20/1994   INFLUENZA, HIGH DOSE SEASONAL PF 04/13/2018, 03/09/2020, 03/14/2021   Influenza Inj Mdck Quad Pf 03/20/2022   Influenza Split 02/22/2019, 02/24/2019   Influenza,inj,Quad PF,6+ Mos 03/03/2014, 03/24/2015, 04/02/2016, 04/22/2017, 02/24/2019   Influenza,inj,Quad PF,6-35 Mos 02/24/2019   Influenza,inj,quad, With Preservative 02/22/2019   Influenza-Unspecified 03/03/2014, 03/24/2015, 04/02/2016, 04/22/2017, 04/13/2018, 02/22/2019, 02/24/2019   Moderna Covid-19 Fall Seasonal Vaccine 7yrs & older 06/03/2022   PFIZER Comirnaty(Gray Top)Covid-19 Tri-Sucrose Vaccine 10/14/2020, 03/29/2021   PFIZER(Purple Top)SARS-COV-2 Vaccination 07/29/2019, 08/24/2019, 03/27/2020   PNEUMOCOCCAL CONJUGATE-20 03/27/2021   Pneumococcal Conjugate,unspecified 03/03/2014, 03/24/2015   Pneumococcal Conjugate-13 03/03/2014, 03/24/2015   Pneumococcal Polysaccharide-23 03/03/2014, 03/24/2015   Pneumococcal-Unspecified 03/03/2014, 03/24/2015   Polio, Unspecified 07/12/1954, 08/23/1954, 09/27/1955, 11/23/1959   Respiratory Syncytial Virus Vaccine,Recomb Aduvanted(Arexvy) 06/04/2022   Smallpox 10/04/1954   Td 10/04/1954, 03/08/1993   Tdap 12/10/2012, 07/24/2020   Unspecified SARS-COV-2 Vaccination 03/27/2020   Zoster Recombinant(Shingrix) 11/14/2017, 02/26/2018, 02/27/2018, 02/22/2019   Zoster, Live 12/30/2011   Zoster, Unspecified 11/14/2017, 02/26/2018, 02/27/2018, 02/22/2019     Objective: Vital Signs: There were no vitals taken for this visit.   Physical Exam   Musculoskeletal Exam: ***  CDAI  Exam: CDAI Score: -- Patient Global: --; Provider Global: -- Swollen: --; Tender: -- Joint Exam 07/27/2024   No joint exam has been documented for this visit   There is currently no information documented on the homunculus. Go to the Rheumatology activity and complete the homunculus joint exam.  Investigation: No additional findings.  Imaging: No results found.  Recent Labs: Lab Results  Component Value Date   WBC 10.0 10/02/2023   HGB 13.1 10/02/2023   PLT 153 10/02/2023   NA 139 10/02/2023   K 3.8 10/02/2023   CL 106 10/02/2023   CO2 24 10/02/2023   GLUCOSE 124 (H) 10/02/2023   BUN 16 10/02/2023   CREATININE 0.82 10/02/2023   BILITOT 1.1 06/23/2023   ALKPHOS 68 06/23/2023   AST 24 06/23/2023   ALT 9 06/23/2023   PROT 8.0 06/23/2023   ALBUMIN 4.8 06/23/2023   CALCIUM 9.0 10/02/2023   GFRAA >60 12/15/2018    Speciality Comments: No specialty comments available.  Procedures:  No procedures performed Allergies: Other   Assessment / Plan:     Visit Diagnoses: No diagnosis found.  Orders: No orders of the defined types were placed in this encounter.  No orders of the defined types were placed in this encounter.   Face-to-face time spent with patient was *** minutes. Greater than 50%  of time was spent in counseling and coordination of care.  Follow-Up Instructions: No follow-ups on file.   Daved JAYSON Gavel, CMA  Note - This record has been created using Animal nutritionist.  Chart creation errors have been sought, but may not always  have been located. Such creation errors do not reflect on  the standard of medical care.    [1]  Social History Tobacco Use   Smoking status: Never    Passive exposure: Past   Smokeless tobacco: Never  Vaping Use   Vaping status: Never Used  Substance Use Topics   Alcohol use: Yes    Alcohol/week: 8.0 standard drinks of alcohol    Types: 7 Glasses of wine, 1 Cans of beer per week    Comment: occas   Drug use: Never    "

## 2024-07-15 ENCOUNTER — Ambulatory Visit: Admitting: Occupational Therapy

## 2024-07-15 ENCOUNTER — Encounter: Payer: Self-pay | Admitting: Occupational Therapy

## 2024-07-15 DIAGNOSIS — R2681 Unsteadiness on feet: Secondary | ICD-10-CM

## 2024-07-15 DIAGNOSIS — R293 Abnormal posture: Secondary | ICD-10-CM

## 2024-07-15 DIAGNOSIS — M6281 Muscle weakness (generalized): Secondary | ICD-10-CM

## 2024-07-15 DIAGNOSIS — R278 Other lack of coordination: Secondary | ICD-10-CM

## 2024-07-15 DIAGNOSIS — R29818 Other symptoms and signs involving the nervous system: Secondary | ICD-10-CM

## 2024-07-15 DIAGNOSIS — M25621 Stiffness of right elbow, not elsewhere classified: Secondary | ICD-10-CM

## 2024-07-15 DIAGNOSIS — R2689 Other abnormalities of gait and mobility: Secondary | ICD-10-CM

## 2024-07-15 NOTE — Therapy (Signed)
 " OUTPATIENT OCCUPATIONAL THERAPY PARKINSON'S TREATMENT  Patient Name: Jim Carson MRN: [id number] DOB:[birth year], 74 y.o., male Today's Date: 07/15/2024  PCP: Abran Slough, MD REFERRING PROVIDER: Georgina Collar MD  END OF SESSION:  OT End of Session - 07/15/24 1327     Visit Number 17    Number of Visits 36    Date for Recertification  09/09/24    Authorization Type Medicare, add KX    Authorization Time Period 12 weeks, anticipate d/c after 8 weeks    Authorization - Visit Number 17    Progress Note Due on Visit 20    OT Start Time 1320    OT Stop Time 1358    OT Time Calculation (min) 38 min    Activity Tolerance Patient tolerated treatment well    Behavior During Therapy WFL for tasks assessed/performed                       Past Medical History:  Diagnosis Date   Abnormal gait    due to parkinson's,  uses cane   Absolute anemia 03/13/2023   Allergic rhinitis    Allergy Spring 1998   Allergic to dustmits, dog hair and roaches.   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 glendia  @[facility]   Pneumonia    Sleep apnea    Vitamin B12 deficiency    Vitamin D  deficiency    Wears glasses    Past Surgical History:  Procedure Laterality Date   APPENDECTOMY  07/1964   APPENDECTOMY     CHOLECYSTECTOMY     JOINT REPLACEMENT  March 2019 and again on 09/10/2021   Left hip twice.  2nd time due to 3 dislocations   LAPAROSCOPIC CHOLECYSTECTOMY  01-21-2017   @[facility] , [place]   lower  back surgery      POSTERIOR CERVICAL FUSION/FORAMINOTOMY     POSTERIOR FUSION CERVICAL SPINE  08-15-2011   @[facility] , [place]   w/ laminectomy and decompression-- C3 -- T1   POSTERIOR LAMINECTOMY / DECOMPRESSION LUMBAR SPINE  07-30-2013   @[facility] , [place]   bilateral T11 - 12,  L2 --5   REMOVAL LEFT T1 PARASPINOUS MASS  02-12-2016   @[facility] ,  [place]   desmoid fibromatosis   SHOULDER ARTHROSCOPY WITH ROTATOR CUFF REPAIR Left 12/15/2018   Procedure: SHOULDER ARTHROSCOPY, biceps tenodesis labored Debridement, submacromial decompression;  Surgeon: Gerome Lamar, MD;  Location: [facility];  Service: Orthopedics;  Laterality: Left;  interscalene block   SPINE SURGERY     TOTAL HIP ARTHROPLASTY Left 09/18/2017   @[facility] , [place]   TOTAL HIP REVISION Left 09/10/2021   Procedure: Left hip acetabular versus total hip arthroplasty revision, constrained liner;  Surgeon: Melodi Lerner, MD;  Location: [facility];  Service: Orthopedics;  Laterality: Left;   TOTAL HIP REVISION Left 10/01/2023   Procedure: Left hip constrained liner revision;  Surgeon: Melodi Lerner, MD;  Location: [facility];  Service: Orthopedics;  Laterality: Left;    Patient Active Problem List   Diagnosis Date Noted   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   Thrombocytopenia 03/17/2023   Absolute anemia 03/13/2023   Family history of prostate cancer 03/13/2023   History of elevated PSA 03/13/2023   AV block, 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   Drug induced constipation 12/30/2022   Acquired hypothyroidism 12/30/2022   Primary osteoarthritis of left hip 05/18/2021   PD (Parkinson's disease) (HCC) 05/18/2021   History of gastroesophageal reflux (GERD) 05/18/2021   OSA (obstructive sleep apnea) 05/18/2021   Essential hypertension 05/18/2021    ONSET DATE: referral 12/09/23  REFERRING DIAG:  Diagnosis  G20.B2 (ICD-10-CM) - Parkinson's disease with dyskinesia, with fluctuations     THERAPY DIAG:  Muscle weakness (generalized)  Abnormal posture  Other abnormalities of gait and mobility  Unsteadiness on feet  Other symptoms and signs involving the nervous system  Other lack of coordination  Stiffness of right elbow, not elsewhere classified  Rationale for Evaluation and Treatment: Rehabilitation  SUBJECTIVE:   SUBJECTIVE STATEMENT: Pt reports no fracture on x-ray  Goes by Jim Carson  PERTINENT HISTORY: Pt reports he had a L hip replacement in 2019 and dislocated it 3 times. Re did it 2023, had a fall and jammed it and had to revise it 10/01/23. Was told not to go too heavy over 110 lbs on the leg press. Was told not to cross his legs  PMH: Parkinson's Disease, hypothyroidism, hx of multiple concussions, GERD, benign essential HTN, abnormal gait, L THA 2019, removal of T1 paraspinous mass 2017, T11-12 and L2-5 lumbar lami/decompression 2015, cervical fusion C3-T1 2013, hx of shoulder surgery June 2020, hip surgery revision 10/01/23 after multiple dislocations  PRECAUTIONS:follow hip precautions avoid bending greater than 90, avoid rotating hip/ foot inwards/ outwards, do not cross legs Other: hip revision, told not to cross legs  WEIGHT BEARING RESTRICTIONS: No  PAIN:  Are you having pain? Yes: NPRS scale: 0-5/10 Pain location: wrist, hand Pain description: aching Aggravating factors: wrist extension Relieving factors: rest  FALLS: Has patient fallen in last 6 months? Yes. Number of falls 3, Pt saw PT to address  LIVING ENVIRONMENT: Lives with: lives with their family and lives with their spouse Lives in: House/apartment  Has following equipment at home: Environmental Consultant - 4 wheeled and shower chair  PLOF: Independent with basic ADLs  PATIENT GOALS: improve grip and coordination, wants to play guitar better  OBJECTIVE:  Note: Objective measures were completed at Evaluation unless otherwise noted.  HAND DOMINANCE: Left  ADLs:  Overall ADLs: increased  time required Transfers/ambulation related to ADLs: Eating: difficulty holding knife to cut food  Grooming: mod I UB Dressing:needs assist with buttons at times LB Dressing: min A at times with socks Toileting: mod  Bathing: mod I Tub Shower transfers: mod I, walk in shower with seat Equipment: Shower seat with back and Walk in shower Has an up walker at home IADLs:  Light housekeeping: Pt does laundry, and cleans up after meals  Meal Prep: wife prepares Community mobility: mod I with rollator Medication management: keeps up with meds Financial management: keeps up with finanaces Handwriting: 50% legible and Moderate micrographia  MOBILITY STATUS: Independent  POSTURE COMMENTS:  rounded shoulders, forward head, and flexed trunk    FUNCTIONAL OUTCOME MEASURES: Fastening/unfastening 3 buttons: 1 min 29 secs Physical performance test: PPT#2 (simulated eating) 14.34 with 1 drop & PPT#4 (donning/doffing  jacket): NT  COORDINATION: 9 Hole Peg test: Right: 2 mins.06 sec multiple drops; Left: 43.83 sec Box and Blocks:  Right 40 blocks, Left 47blocks  UE ROM:  RUE shoulder flexion 120, elbow extension -20, LUE shoulder flexion 130, elbow extension -20, decreased bilateral grip/ composite flexion due to arthritis  COGNITION: Overall cognitive status: Within functional limits for tasks assessed  OBSERVATIONS: Bradykinesia                                                                                                                    TREATMENT DATE:  07/15/24 UBE x 6 mins for conditioning min v.c for speed. PWR! up and rock in seated mod v.c for amplitude. Tendon gliding exercises with RUE due to edema, min v.c followed by ice pack x 7 mins to RUE while perfroming activities with LUE PWR! hands for RUE for PWR! up rock and step followed by simulated eating with and without foam grip, mod v.c for techniques. Standing at wall for PWR! up, then arms in abdcution for improved  posture. Disucced avoiding overhead weights at the gym as well as biceps curls, due to tight elbows and crunches and trunk ext due to hx of back surgery. Pt was cautioned against LE abduction/ adduction due to hx of hip surgery.   07/12/24- Pt arrived today s/p fall with swelling in R hand. Therapist discussed with pt/ wife recommendation that pt has his hand x-rayed as swelling still persists nearly 2 weeks after his accient.Pt/ wife verbalize understanding. PWR! rock and step at countertop, as well as marching, min v.c for amplitude. Pt was cued to avoid heavy weightbearing through RUE.  Stepping to left and right sides to flip playing cards, min-mod v.c for amplitude. Seated cane exercises for shoulder flexion and chest press, min v.c Fine motor coordination task with LUE to copy small peg design, mod difficulty and increased time, min v.c for flicks. while ice pack applied to R hand x approx 7 mins, no adverse reactions.     06/29/24-UBE x 6 mins for conditioning Standing with back against wall hands on rollator in front then with superivion/ min A from therapist standing with palms against wall for PWR! up while squeezing shoulder blades back, then arms in abduction on wall with supevision-min A, v.c and facilitation for upright posture PWR! rock at [employer], modified quadraped at [employer] 10-20 reps each Stepping forwards and backwards at countertop, rocking forwards and backwards for modified quaraped PWR! rock, marching at [employer], min-mod v.c these activities and min facilitlitation for amplitude and posture. Dynamic step and reach to flip playing cards, min-mod facillitation. PWR! hands basic 4 min v.c disccused safety for tying shoes, recommended elastic laces or slip on shoes due to hip prec. Placing grooved pegs into pegboard with RUE mod difficulty/ v.c for icnreased coordination   06/17/24 Standing with back against wall hands on rollator in front then with min A from  therapist standing with palms against wall for PWR! up while squeezing shoulder blades back,  then arms in abduction on wall with min A, v.c and facilitation for upright posture PWR! rock at [employer], modified quadraped at [employer] 10-20 reps each Seate closed chain chest press and shoulder flexion, mod v.c Therapist checked progress towards goals see goals below. 06/14/24 UBE x 6 mins level 1 for conditioning  Standing with back against wall hands on rollator in front then with min A from therapist standing with palms against wall for PWR! up while squeezing shoulder blades back, then arms in abduction on wall with min A, v.c and facilitation for upright posture PWR! rock at [employer], modified quadraped at [employer] 10-20 reps each Dynamic step and reach to flip playing cards to left and right sides, min facilitation and assist for balance checked 9 hole peg test and PPT#2 simulated eating in prep for renewal next visit. Therapist recommends pt works more with flipping playing cards and coins at home for improved coordination  Pt reports participating in PD cycle, therapist recommends pt does not participate in rock steady at home due to fall risk. Pt to call MD for a referral to PT.  06/03/24- UBE x 5 mins level 1 for conditioning  Standing with back against wall hands on rollator in front then with min A from therapist standing with palms against wall for PWR! up while squeezing shoulder blades back, then arms in abduction on wall with min A, v.c and facilitation for upright posture PWR! rock at [employer], modified quadraped at [employer] 10-20 reps each Sit to stand with emphasis on calibration, PWR! up for sit to stand, and focus on controlled sit Naviagiting gym with big steps and upright posture, mod v.c   05/31/24 UBE x 6 mins level 1 for conditioning min v.c to maintain 40 rpm Standing to perform PWR! rock and step  while flipping playing cards at countertop, marching min guard for  balance, min v.c for amplitude Seated PWR! up without rocking forwards, then closed chain shoulder flexion with cane, chest press and abduction,  min v.c Standing with back against wall hands on rollator in front then with min A from therapist standing with palms against wall for PWR! up while squeezing shoulder blades back, then arms in abduction on wall with min A, v.c and facilitation for upright posture Pt's posture was improved at end of session. Discussion with pt regarding safety for turns and sit to stand as pt moves very quickly and is at risk for falls   05/25/24-Therapist checked progress in prep for progress note, see short term goals below. Handwriting activity for continuous l, continuouse followed by writing activity, pt was able to write a sentence with 100% legibility and minimal decrease in letter size. PWR! hands for PWR! up prior to task discussed importance of avoiding twisting at hips to avoid hip dislocation.  Standing to perform PWR! rock and step at countertop, marching min guard for balance, min v.c for amplitude Seated PWR! up without rocking forwards, then closed chain shoulder flexion with cane, min v.c discussed avoiding biceps curls at the gym due to likelihood of making elbows tight and difficulty with elbow extension UBE x 5 mins level 1 for conditioning  05/21/24:  Closed-chain shoulder flex in sitting with BUEs with foam noodle and min cueing for posture and elbow ext followed by diagonals to each side with gentle wt. Shift and trunk rotation with min cueing.  PWR! Hands x10 with min cueing for large amplitude movements.  Checked shoulder/elbow ROM for functional reach (see goals section below).  Practiced functional reaching with each UE to grasp/release cylinder objects incorporating reaching laterally and across body with min cueing to slow down, large amplitude,  and track hand/object with eyes to decr drops/knocking items over.  Reviewed strategies for  fastening/unfastening buttons and practiced with min v.c. (also checked goal--see below).    Practiced writing with min cueing for large amplitude.  Pt wrote 8 sentences with approx 95% legibility and min decr in size.    Checked PPT#2 (see below) with no drops, min cueing for grasp on utensils.    05/11/24 Pt missed therapy last week due to pt's wife's illness.  supine on mat with hotpack underback, 2 pillows under head and roll under knees PWR! up with min/ mod facilitation followed by cane exercises for chest press and shoulder flexion, bilateral shoulder abduction- then clapping hands together over chest, no twist due to hx of hip dislocation, no adverse reactions to heat x 15 mins, pain decreased Seated, PWR up for arms without leaning forward then reaching to side unilaterally with right and left UE's, mod v.c Standing a t countertop, stepping to left and right sides while holding onto ocuntetop then PWR! rock , then stepping while reaching for targets on cabinets, min-mod v.c for amplitude and posture, close supervision for balance PWR! hands for PWR! up, twist and step. Therapist reviewed dealing cards with thumb for left and right hands. Pt was encouraged to perform coordination activities at  home. Therapist checked bilateral 9 hole peg test, see goals below.   PATIENT EDUCATION: Education details: recommendation for x ray to hand  Person educated: Patient Education method: Explanation, Demonstration, Tactile cues, Verbal cues,  Education comprehension: verbalized understanding, returned demonstration, verbal cues required, and needs further education   HOME EXERCISE PROGRAM:  03/30/24-cane exercises in supine 04/02/24:  PWR! Hands, Coordination HEP with focus on large amplitude  04/09/24:  Strategies for eating and writing  GOALS: Goals reviewed with patient? Yes  SHORT TERM GOALS: Target date: 07/30/24   I with PD specific HEP Goal status: met, 04/29/24  2.  I with  adapted strategies/AE to maximize safety and I with ADLs/IADLs. Goal status: ongoing  06/17/24  3.   Pt will demonstrate improved fine motor coordination for ADLs as evidenced by decreasing 9 hole peg test score for RUE to 2 mins or less without drops Reveised goal: Pt will demonstrate improved fine motor coordination for ADLs as evidenced by decreasing 9 hole peg test score for RUE to 1 min 15 secs with no more than 2 drops. Goal status:partially met 48 secs with drops  05/25/24,  not met 2 mins 22 secs- 1st trial several drops, 1 min 22 secs -06/14/24,  ongoing 06/17/24- 1 min 22 secs with several drops , continue with revised goal  4.   Pt will demonstrate improved fine motor coordination for ADLs as evidenced by decreasing 9 hole peg test score for LUE by 3 secs Baseline: LUE 43.83 secs Goal status:  met, 37.10 05/11/24  5.  Pt will demonstrate improved ease with feeding as evidenced by decreasing PPT#2 to 11.34 without drops Baseline:  Goal status: ongoing 04/06/24, 05/11/24- ongoing.  05/21/24 ongoing 16.31sec without drops, 05/25/24-15.33 secs  06/17/24 ongoing 15.81 07/15/24  16.  Pt will write a  sentence with 100% legibility and minimal decrease in letter size Goal status:  met 05/25/24 LONG TERM GOALS: Target date: 09/09/24  Pt will demonstrate improved ease with fastening buttons as evidenced by decreasing 3 button/ unbutton time to :  1 min 20 secs or less Baseline: 1 min 29 secs Goal status: 05/21/24 MET 56.19sec  2.  Pt will demonstrate ability to retrieve a lightweight object at 125 shoulder flexion and -15 elbow extension with RUE Goal status:ongoing 115, -25 06/17/24  3. Pt will demonstrate ability to retrieve a lightweight object at 130 shoulder flexion and -15 elbow extension with LUE Goal status: met 130, -15  4.  Pt will verbalize understanding of ways to prevent future PD related complications and PD community resources. Goal status:ongoing 07/15/24  5.    Improve PPT#4 to 22 secs or less for increased ease with dressing Baseline: 26.24 Goal status: ongoing 1 minute,  seated 06/17/24  6.  Pt will report dropping utensils. and cups less frequently Goal status:  ongoing, still dropping, 1/15   ASSESSMENT:  CLINICAL IMPRESSION: Pt is progressing towards goals. Today he was limited by RUE pain and swelling from fall. Therapist recommends pt has x-ray of hand.   PERFORMANCE DEFICITS: in functional skills including ADLs, IADLs, coordination, dexterity, ROM, strength, flexibility, Fine motor control, Gross motor control, mobility, balance, decreased knowledge of precautions, decreased knowledge of use of DME, and UE functional use, , and psychosocial skills including coping strategies, environmental adaptation, habits, interpersonal interactions, and routines and behaviors.   IMPAIRMENTS: are limiting patient from ADLs, IADLs, rest and sleep, play, leisure, and social participation.   COMORBIDITIES:  may have co-morbidities  that affects occupational performance. Patient will benefit from skilled OT to address above impairments and improve overall function.  MODIFICATION OR ASSISTANCE TO COMPLETE EVALUATION: No modification of tasks or assist necessary to complete an evaluation.  OT OCCUPATIONAL PROFILE AND HISTORY: Detailed assessment: Review of records and additional review of physical, cognitive, psychosocial history related to current functional performance.  CLINICAL DECISION MAKING: LOW - limited treatment options, no task modification necessary  REHAB POTENTIAL: Good  EVALUATION COMPLEXITY: Low   PLAN:  OT FREQUENCY: 2x/week  OT DURATION: 12 weeks- anticipate d/c after 8 weeks  PLANNED INTERVENTIONS: 97168 OT Re-evaluation, 97535 self care/ADL training, 02889 therapeutic exercise, 97530 therapeutic activity, 97112 neuromuscular re-education, 97140 manual therapy, 97035 ultrasound, 97018 paraffin, 02989 moist heat, 97010 cryotherapy,  97760 Orthotic Initial, 97763 Orthotic/Prosthetic subsequent, passive range of motion, balance training, functional mobility training, energy conservation, coping strategies training, patient/family education, and DME and/or AE instructions  RECOMMENDED OTHER SERVICES: n/a  CONSULTED AND AGREED WITH PLAN OF CARE: Patient and family adult nurse- wife Rock  PLAN FOR NEXT SESSION:   work towards unmet goals, ADL strategies, coordination, address RUe pain/ selling after pt sees MD  Hx of multiple hip dislocations, exercise caution with LE movement, twisting, no bending > 90*    Bradyn Soward, OTR/L  07/15/2024, 3:14 PM   "

## 2024-07-19 ENCOUNTER — Encounter: Payer: Self-pay | Admitting: Occupational Therapy

## 2024-07-19 ENCOUNTER — Ambulatory Visit: Admitting: Occupational Therapy

## 2024-07-19 DIAGNOSIS — R2689 Other abnormalities of gait and mobility: Secondary | ICD-10-CM

## 2024-07-19 DIAGNOSIS — M25622 Stiffness of left elbow, not elsewhere classified: Secondary | ICD-10-CM

## 2024-07-19 DIAGNOSIS — M25621 Stiffness of right elbow, not elsewhere classified: Secondary | ICD-10-CM

## 2024-07-19 DIAGNOSIS — R2681 Unsteadiness on feet: Secondary | ICD-10-CM

## 2024-07-19 DIAGNOSIS — R29818 Other symptoms and signs involving the nervous system: Secondary | ICD-10-CM | POA: Diagnosis not present

## 2024-07-19 DIAGNOSIS — R293 Abnormal posture: Secondary | ICD-10-CM

## 2024-07-19 DIAGNOSIS — M6281 Muscle weakness (generalized): Secondary | ICD-10-CM

## 2024-07-19 NOTE — Therapy (Signed)
 " OUTPATIENT OCCUPATIONAL THERAPY PARKINSON'S TREATMENT  Patient Name: Jim Carson MRN: [id number] DOB:11/02/1950, 74 y.o., male Today's Date: 07/19/2024  PCP: Abran Slough, MD REFERRING PROVIDER: Georgina Collar MD  END OF SESSION:  OT End of Session - 07/19/24 1335     Visit Number 18    Number of Visits 36    Date for Recertification  09/09/24    Authorization Type Medicare, add KX    Authorization Time Period 12 weeks, anticipate d/c after 8 weeks    Authorization - Visit Number 18    Progress Note Due on Visit 20    OT Start Time 1318    OT Stop Time 1358    OT Time Calculation (min) 40 min    Activity Tolerance Patient tolerated treatment well    Behavior During Therapy [facility] for tasks assessed/performed                        Past Medical History:  Diagnosis Date   Abnormal gait    due to parkinson's,  uses cane   Absolute anemia 03/13/2023   Allergic rhinitis    Allergy Spring 1998   Allergic to dustmits, dog hair and roaches.   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 glendia  @[facility]   Pneumonia    Sleep apnea    Vitamin B12 deficiency    Vitamin D  deficiency    Wears glasses    Past Surgical History:  Procedure Laterality Date   APPENDECTOMY  07/1964   APPENDECTOMY     CHOLECYSTECTOMY     JOINT REPLACEMENT  March 2019 and again on 09/10/2021   Left hip twice.  2nd time due to 3 dislocations   LAPAROSCOPIC CHOLECYSTECTOMY  01-21-2017   @[facility] , [place]    lower back surgery      POSTERIOR CERVICAL FUSION/FORAMINOTOMY     POSTERIOR FUSION CERVICAL SPINE  08-15-2011   @[facility] , [place]   w/ laminectomy and decompression-- C3 -- T1   POSTERIOR LAMINECTOMY / DECOMPRESSION LUMBAR SPINE  07-30-2013   @[facility] , [place]   bilateral T11 - 12,  L2 --5   REMOVAL LEFT T1 PARASPINOUS MASS  02-12-2016   @[facility] ,  [place]   desmoid fibromatosis   SHOULDER ARTHROSCOPY WITH ROTATOR CUFF REPAIR Left 12/15/2018   Procedure: SHOULDER ARTHROSCOPY, biceps tenodesis labored Debridement, submacromial decompression;  Surgeon: Gerome Lamar, MD;  Location: [facility];  Service: Orthopedics;  Laterality: Left;  interscalene block   SPINE SURGERY     TOTAL HIP ARTHROPLASTY Left 09/18/2017   @[facility] , [place]   TOTAL HIP REVISION Left 09/10/2021   Procedure: Left hip acetabular versus total hip arthroplasty revision, constrained liner;  Surgeon: Melodi Lerner, MD;  Location: [facility];  Service: Orthopedics;  Laterality: Left;   TOTAL HIP REVISION Left 10/01/2023   Procedure: Left hip constrained liner revision;  Surgeon: Melodi Lerner, MD;  Location: [facility];  Service: Orthopedics;  Laterality: Left;    Patient Active Problem List   Diagnosis Date Noted   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   Thrombocytopenia 03/17/2023   Absolute anemia 03/13/2023   Family history of prostate cancer 03/13/2023   History of elevated PSA 03/13/2023   AV block, 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   Drug induced constipation 12/30/2022   Acquired hypothyroidism 12/30/2022   Primary osteoarthritis of left hip 05/18/2021   PD (Parkinson's disease) (HCC) 05/18/2021   History of gastroesophageal reflux (GERD) 05/18/2021   OSA (obstructive sleep apnea) 05/18/2021   Essential hypertension 05/18/2021    ONSET DATE: referral 12/09/23  REFERRING DIAG:  Diagnosis  G20.B2 (ICD-10-CM) - Parkinson's disease with dyskinesia, with  fluctuations    THERAPY DIAG:  Muscle weakness (generalized)  Abnormal posture  Other abnormalities of gait and mobility  Unsteadiness on feet  Other symptoms and signs involving the nervous system  Stiffness of right elbow, not elsewhere classified  Stiffness of left elbow, not elsewhere classified  Rationale for Evaluation and Treatment: Rehabilitation  SUBJECTIVE:   SUBJECTIVE STATEMENT: Pt reports hand is a little better  Goes by Jim Carson  PERTINENT HISTORY: Pt reports he had a L hip replacement in 2019 and dislocated it 3 times. Re did it 2023, had a fall and jammed it and had to revise it 10/01/23. Was told not to go too heavy over 110 lbs on the leg press. Was told not to cross his legs  PMH: Parkinson's Disease, hypothyroidism, hx of multiple concussions, GERD, benign essential HTN, abnormal gait, L THA 2019, removal of T1 paraspinous mass 2017, T11-12 and L2-5 lumbar lami/decompression 2015, cervical fusion C3-T1 2013, hx of shoulder surgery June 2020, hip surgery revision 10/01/23 after multiple dislocations  PRECAUTIONS:follow hip precautions avoid bending greater than 90, avoid rotating hip/ foot inwards/ outwards, do not cross legs Other: hip revision, told not to cross legs  WEIGHT BEARING RESTRICTIONS: No  PAIN:  Are you having pain? Yes: NPRS scale: 0-5/10 Pain location: wrist, hand Pain description: aching Aggravating factors: wrist extension Relieving factors: rest  FALLS: Has patient fallen in last 6 months? Yes. Number of falls 3, Pt saw PT to address  LIVING ENVIRONMENT: Lives with: lives with their family and lives with their spouse Lives in: House/apartment  Has following equipment at home: Environmental Consultant - 4 wheeled and shower chair  PLOF: Independent with basic ADLs  PATIENT GOALS: improve grip and coordination, wants to play guitar better  OBJECTIVE:  Note: Objective measures were completed at Evaluation unless otherwise noted.  HAND DOMINANCE:  Left  ADLs:  Overall ADLs: increased time required Transfers/ambulation related to ADLs: Eating: difficulty holding knife to cut food  Grooming: mod I UB Dressing:needs assist with buttons at times LB Dressing: min A at times with socks Toileting: mod  Bathing: mod I Tub Shower transfers: mod I, walk in shower with seat Equipment: Shower seat with back and Walk in shower Has an up walker at home IADLs:  Light housekeeping: Pt does laundry, and cleans up after meals  Meal Prep: wife prepares Community mobility: mod I with rollator Medication management: keeps up with meds Financial management: keeps up with finanaces Handwriting: 50% legible and Moderate micrographia  MOBILITY STATUS: Independent  POSTURE COMMENTS:  rounded shoulders, forward head, and flexed trunk    FUNCTIONAL OUTCOME MEASURES: Fastening/unfastening 3 buttons: 1 min 29 secs Physical performance test: PPT#2 (simulated eating) 14.34 with 1  drop & PPT#4 (donning/doffing jacket): NT  COORDINATION: 9 Hole Peg test: Right: 2 mins.06 sec multiple drops; Left: 43.83 sec Box and Blocks:  Right 40 blocks, Left 47blocks  UE ROM:  RUE shoulder flexion 120, elbow extension -20, LUE shoulder flexion 130, elbow extension -20, decreased bilateral grip/ composite flexion due to arthritis  COGNITION: Overall cognitive status: Within functional limits for tasks assessed  OBSERVATIONS: Bradykinesia                                                                                                                    TREATMENT DATE:  07/19/24 UBE x 6 mins for conditioning min v.c for speed. Supine, scapular retractiion, then chest press with cane and shoulder flexion, min v.c Seated PWR! moves for PWR! up, rock, and step, min v.c Standing at counter, modified quadraped PWR! rock and step, mod v.c and min facilitation, Standing PWR! step, min v.c and close supervision.  Theraband exercises with yellow band for shoulder  abduction, rowing and shoulder extension, min mod v.c for positioning.   07/15/24 UBE x 6 mins for conditioning min v.c for speed. PWR! up and rock in seated mod v.c for amplitude. Tendon gliding exercises with RUE due to edema, min v.c followed by ice pack x 7 mins to RUE while perfroming activities with LUE PWR! hands for RUE for PWR! up rock and step followed by simulated eating with and without foam grip, mod v.c for techniques. Standing at wall for PWR! up, then arms in abdcution for improved posture. Discussed avoiding overhead weights at the gym as well as biceps curls, due to tight elbows and crunches and trunk ext due to hx of back surgery. Pt was cautioned against LE abduction/ adduction due to hx of hip surgery.   07/12/24- Pt arrived today s/p fall with swelling in R hand. Therapist discussed with pt/ wife recommendation that pt has his hand x-rayed as swelling still persists nearly 2 weeks after his accient.Pt/ wife verbalize understanding. PWR! rock and step at countertop, as well as marching, min v.c for amplitude. Pt was cued to avoid heavy weightbearing through RUE.  Stepping to left and right sides to flip playing cards, min-mod v.c for amplitude. Seated cane exercises for shoulder flexion and chest press, min v.c Fine motor coordination task with LUE to copy small peg design, mod difficulty and increased time, min v.c for flicks. while ice pack applied to R hand x approx 7 mins, no adverse reactions.     06/29/24-UBE x 6 mins for conditioning Standing with back against wall hands on rollator in front then with superivion/ min A from therapist standing with palms against wall for PWR! up while squeezing shoulder blades back, then arms in abduction on wall with supevision-min A, v.c and facilitation for upright posture PWR! rock at [employer], modified quadraped at [employer] 10-20 reps each Stepping forwards and backwards at countertop, rocking forwards and backwards for  modified quaraped PWR! rock, marching at [employer], min-mod v.c these activities and min facilitlitation  for amplitude and posture. Dynamic step and reach to flip playing cards, min-mod facillitation. PWR! hands basic 4 min v.c disccused safety for tying shoes, recommended elastic laces or slip on shoes due to hip prec. Placing grooved pegs into pegboard with RUE mod difficulty/ v.c for icnreased coordination   06/17/24 Standing with back against wall hands on rollator in front then with min A from therapist standing with palms against wall for PWR! up while squeezing shoulder blades back, then arms in abduction on wall with min A, v.c and facilitation for upright posture PWR! rock at [employer], modified quadraped at [employer] 10-20 reps each Seate closed chain chest press and shoulder flexion, mod v.c Therapist checked progress towards goals see goals below. 06/14/24 UBE x 6 mins level 1 for conditioning  Standing with back against wall hands on rollator in front then with min A from therapist standing with palms against wall for PWR! up while squeezing shoulder blades back, then arms in abduction on wall with min A, v.c and facilitation for upright posture PWR! rock at [employer], modified quadraped at [employer] 10-20 reps each Dynamic step and reach to flip playing cards to left and right sides, min facilitation and assist for balance checked 9 hole peg test and PPT#2 simulated eating in prep for renewal next visit. Therapist recommends pt works more with flipping playing cards and coins at home for improved coordination  Pt reports participating in PD cycle, therapist recommends pt does not participate in rock steady at home due to fall risk. Pt to call MD for a referral to PT.  06/03/24- UBE x 5 mins level 1 for conditioning  Standing with back against wall hands on rollator in front then with min A from therapist standing with palms against wall for PWR! up while squeezing shoulder  blades back, then arms in abduction on wall with min A, v.c and facilitation for upright posture PWR! rock at [employer], modified quadraped at [employer] 10-20 reps each Sit to stand with emphasis on calibration, PWR! up for sit to stand, and focus on controlled sit Naviagiting gym with big steps and upright posture, mod v.c   05/31/24 UBE x 6 mins level 1 for conditioning min v.c to maintain 40 rpm Standing to perform PWR! rock and step  while flipping playing cards at countertop, marching min guard for balance, min v.c for amplitude Seated PWR! up without rocking forwards, then closed chain shoulder flexion with cane, chest press and abduction,  min v.c Standing with back against wall hands on rollator in front then with min A from therapist standing with palms against wall for PWR! up while squeezing shoulder blades back, then arms in abduction on wall with min A, v.c and facilitation for upright posture Pt's posture was improved at end of session. Discussion with pt regarding safety for turns and sit to stand as pt moves very quickly and is at risk for falls   05/25/24-Therapist checked progress in prep for progress note, see short term goals below. Handwriting activity for continuous l, continuouse followed by writing activity, pt was able to write a sentence with 100% legibility and minimal decrease in letter size. PWR! hands for PWR! up prior to task discussed importance of avoiding twisting at hips to avoid hip dislocation.  Standing to perform PWR! rock and step at countertop, marching min guard for balance, min v.c for amplitude Seated PWR! up without rocking forwards, then closed chain shoulder flexion with cane, min v.c discussed avoiding biceps curls at the  gym due to likelihood of making elbows tight and difficulty with elbow extension UBE x 5 mins level 1 for conditioning  05/21/24:  Closed-chain shoulder flex in sitting with BUEs with foam noodle and min cueing for posture  and elbow ext followed by diagonals to each side with gentle wt. Shift and trunk rotation with min cueing.  PWR! Hands x10 with min cueing for large amplitude movements.  Checked shoulder/elbow ROM for functional reach (see goals section below).  Practiced functional reaching with each UE to grasp/release cylinder objects incorporating reaching laterally and across body with min cueing to slow down, large amplitude,  and track hand/object with eyes to decr drops/knocking items over.  Reviewed strategies for fastening/unfastening buttons and practiced with min v.c. (also checked goal--see below).    Practiced writing with min cueing for large amplitude.  Pt wrote 8 sentences with approx 95% legibility and min decr in size.    Checked PPT#2 (see below) with no drops, min cueing for grasp on utensils.    05/11/24 Pt missed therapy last week due to pt's wife's illness.  supine on mat with hotpack underback, 2 pillows under head and roll under knees PWR! up with min/ mod facilitation followed by cane exercises for chest press and shoulder flexion, bilateral shoulder abduction- then clapping hands together over chest, no twist due to hx of hip dislocation, no adverse reactions to heat x 15 mins, pain decreased Seated, PWR up for arms without leaning forward then reaching to side unilaterally with right and left UE's, mod v.c Standing a t countertop, stepping to left and right sides while holding onto ocuntetop then PWR! rock , then stepping while reaching for targets on cabinets, min-mod v.c for amplitude and posture, close supervision for balance PWR! hands for PWR! up, twist and step. Therapist reviewed dealing cards with thumb for left and right hands. Pt was encouraged to perform coordination activities at  home. Therapist checked bilateral 9 hole peg test, see goals below.   PATIENT EDUCATION: Education detailssee above Person educated: Patient Education method: Programmer, Multimedia, Demonstration,  Actor cues, Verbal cues,  Education comprehension: verbalized understanding, returned demonstration, verbal cues required, and needs further education   HOME EXERCISE PROGRAM:  03/30/24-cane exercises in supine 04/02/24:  PWR! Hands, Coordination HEP with focus on large amplitude  04/09/24:  Strategies for eating and writing  GOALS: Goals reviewed with patient? Yes  SHORT TERM GOALS: Target date: 07/30/24   I with PD specific HEP Goal status: met, 04/29/24  2.  I with adapted strategies/AE to maximize safety and I with ADLs/IADLs. Goal status: ongoing  06/17/24  3.   Pt will demonstrate improved fine motor coordination for ADLs as evidenced by decreasing 9 hole peg test score for RUE to 2 mins or less without drops Reveised goal: Pt will demonstrate improved fine motor coordination for ADLs as evidenced by decreasing 9 hole peg test score for RUE to 1 min 15 secs with no more than 2 drops. Goal status:partially met 48 secs with drops  05/25/24,  not met 2 mins 22 secs- 1st trial several drops, 1 min 22 secs -06/14/24,  ongoing 06/17/24- 1 min 22 secs with several drops , continue with revised goal  4.   Pt will demonstrate improved fine motor coordination for ADLs as evidenced by decreasing 9 hole peg test score for LUE by 3 secs Baseline: LUE 43.83 secs Goal status:  met, 37.10 05/11/24  5.  Pt will demonstrate improved ease with feeding as evidenced  by decreasing PPT#2 to 11.34 without drops Baseline:  Goal status: ongoing 04/06/24, 05/11/24- ongoing.  05/21/24 ongoing 16.31sec without drops, 05/25/24-15.33 secs  06/17/24 ongoing 15.81 07/15/24  16.  Pt will write a  sentence with 100% legibility and minimal decrease in letter size Goal status:  met 05/25/24 LONG TERM GOALS: Target date: 09/09/24  Pt will demonstrate improved ease with fastening buttons as evidenced by decreasing 3 button/ unbutton time to :1 min 20 secs or less Baseline: 1 min 29 secs Goal status: 05/21/24  MET 56.19sec  2.  Pt will demonstrate ability to retrieve a lightweight object at 125 shoulder flexion and -15 elbow extension with RUE Goal status:ongoing 115, -25 06/17/24  3. Pt will demonstrate ability to retrieve a lightweight object at 130 shoulder flexion and -15 elbow extension with LUE Goal status: met 130, -15  4.  Pt will verbalize understanding of ways to prevent future PD related complications and PD community resources. Goal status:ongoing 07/15/24  5.   Improve PPT#4 to 22 secs or less for increased ease with dressing Baseline: 26.24 Goal status: ongoing 1 minute,  seated 06/17/24  6.  Pt will report dropping utensils. and cups less frequently Goal status:  ongoing, still dropping, 1/15   ASSESSMENT:  CLINICAL IMPRESSION: Pt is progressing towards goals. He demonstrates decreased swelling in his hand nd improved posture end of session.  PERFORMANCE DEFICITS: in functional skills including ADLs, IADLs, coordination, dexterity, ROM, strength, flexibility, Fine motor control, Gross motor control, mobility, balance, decreased knowledge of precautions, decreased knowledge of use of DME, and UE functional use, , and psychosocial skills including coping strategies, environmental adaptation, habits, interpersonal interactions, and routines and behaviors.   IMPAIRMENTS: are limiting patient from ADLs, IADLs, rest and sleep, play, leisure, and social participation.   COMORBIDITIES:  may have co-morbidities  that affects occupational performance. Patient will benefit from skilled OT to address above impairments and improve overall function.  MODIFICATION OR ASSISTANCE TO COMPLETE EVALUATION: No modification of tasks or assist necessary to complete an evaluation.  OT OCCUPATIONAL PROFILE AND HISTORY: Detailed assessment: Review of records and additional review of physical, cognitive, psychosocial history related to current functional performance.  CLINICAL DECISION MAKING: LOW -  limited treatment options, no task modification necessary  REHAB POTENTIAL: Good  EVALUATION COMPLEXITY: Low   PLAN:  OT FREQUENCY: 2x/week  OT DURATION: 12 weeks- anticipate d/c after 8 weeks  PLANNED INTERVENTIONS: 97168 OT Re-evaluation, 97535 self care/ADL training, 02889 therapeutic exercise, 97530 therapeutic activity, 97112 neuromuscular re-education, 97140 manual therapy, 97035 ultrasound, 97018 paraffin, 02989 moist heat, 97010 cryotherapy, 97760 Orthotic Initial, 97763 Orthotic/Prosthetic subsequent, passive range of motion, balance training, functional mobility training, energy conservation, coping strategies training, patient/family education, and DME and/or AE instructions  RECOMMENDED OTHER SERVICES: n/a  CONSULTED AND AGREED WITH PLAN OF CARE: Patient and family adult nurse- wife Rock GORY FOR NEXT SESSION:   review yellow theraband and issue next visit  Hx of multiple hip dislocations, exercise caution with LE movement, twisting, no bending > 90*    Marlene Beidler, OTR/L  07/19/2024, 1:36 PM   "

## 2024-07-20 NOTE — Therapy (Unsigned)
 " OUTPATIENT OCCUPATIONAL THERAPY PARKINSON'S TREATMENT  Patient Name: Jim Carson MRN: [id number] DOB:[birth year], 74 y.o., male Today's Date: 07/21/2024  PCP: Abran Slough, MD REFERRING PROVIDER: Georgina Collar MD  END OF SESSION:  OT End of Session - 07/21/24 1513     Visit Number 19    Number of Visits 36    Date for Recertification  09/09/24    Authorization Type Medicare, add KX    Authorization Time Period 12 weeks, anticipate d/c after 8 weeks    Authorization - Visit Number 19    Progress Note Due on Visit 20    OT Start Time 1319    OT Stop Time 1400    OT Time Calculation (min) 41 min    Activity Tolerance Patient tolerated treatment well    Behavior During Therapy WFL for tasks assessed/performed                         Past Medical History:  Diagnosis Date   Abnormal gait    due to parkinson's,  uses cane   Absolute anemia 03/13/2023   Allergic rhinitis    Allergy Spring 1998   Allergic to dustmits, dog hair and roaches.   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 glendia  @[facility]   Pneumonia    Sleep apnea    Vitamin B12 deficiency    Vitamin D  deficiency    Wears glasses    Past Surgical History:  Procedure Laterality Date   APPENDECTOMY  07/1964   APPENDECTOMY     CHOLECYSTECTOMY     JOINT REPLACEMENT  March 2019 and again on 09/10/2021   Left hip twice.  2nd time due to 3 dislocations   LAPAROSCOPIC CHOLECYSTECTOMY  01-21-2017   @[facility] , [place]    lower back surgery      POSTERIOR CERVICAL FUSION/FORAMINOTOMY     POSTERIOR FUSION CERVICAL SPINE  08-15-2011   @[facility] , [place]   w/ laminectomy and decompression-- C3 -- T1   POSTERIOR LAMINECTOMY / DECOMPRESSION LUMBAR SPINE  07-30-2013   @[facility] , [place]   bilateral T11 - 12,  L2 --5   REMOVAL LEFT T1 PARASPINOUS MASS  02-12-2016   @[facility] ,  [place]   desmoid fibromatosis   SHOULDER ARTHROSCOPY WITH ROTATOR CUFF REPAIR Left 12/15/2018   Procedure: SHOULDER ARTHROSCOPY, biceps tenodesis labored Debridement, submacromial decompression;  Surgeon: Gerome Lamar, MD;  Location: [facility];  Service: Orthopedics;  Laterality: Left;  interscalene block   SPINE SURGERY     TOTAL HIP ARTHROPLASTY Left 09/18/2017   @[facility] , [place]   TOTAL HIP REVISION Left 09/10/2021   Procedure: Left hip acetabular versus total hip arthroplasty revision, constrained liner;  Surgeon: Melodi Lerner, MD;  Location: [facility];  Service: Orthopedics;  Laterality: Left;   TOTAL HIP REVISION Left 10/01/2023   Procedure: Left hip constrained liner revision;  Surgeon: Melodi Lerner,  MD;  Location: [facility];  Service: Orthopedics;  Laterality: Left;    Patient Active Problem List   Diagnosis Date Noted   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   Thrombocytopenia 03/17/2023   Absolute anemia 03/13/2023   Family history of prostate cancer 03/13/2023   History of elevated PSA 03/13/2023   AV block, 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   Drug induced constipation 12/30/2022   Acquired hypothyroidism 12/30/2022   Primary osteoarthritis of left hip 05/18/2021   PD (Parkinson's disease) (HCC) 05/18/2021   History of gastroesophageal reflux (GERD) 05/18/2021   OSA (obstructive sleep apnea) 05/18/2021   Essential hypertension 05/18/2021    ONSET DATE: referral 12/09/23  REFERRING DIAG:  Diagnosis  G20.B2 (ICD-10-CM) - Parkinson's disease with dyskinesia, with  fluctuations    THERAPY DIAG:  Muscle weakness (generalized)  Abnormal posture  Other abnormalities of gait and mobility  Unsteadiness on feet  Stiffness of right elbow, not elsewhere classified  Stiffness of left elbow, not elsewhere classified  Other lack of coordination  Rationale for Evaluation and Treatment: Rehabilitation  SUBJECTIVE:   SUBJECTIVE STATEMENT: Pt reports hand is a little better  Goes by Jim Carson  PERTINENT HISTORY: Pt reports he had a L hip replacement in 2019 and dislocated it 3 times. Re did it 2023, had a fall and jammed it and had to revise it 10/01/23. Was told not to go too heavy over 110 lbs on the leg press. Was told not to cross his legs  PMH: Parkinson's Disease, hypothyroidism, hx of multiple concussions, GERD, benign essential HTN, abnormal gait, L THA 2019, removal of T1 paraspinous mass 2017, T11-12 and L2-5 lumbar lami/decompression 2015, cervical fusion C3-T1 2013, hx of shoulder surgery June 2020, hip surgery revision 10/01/23 after multiple dislocations  PRECAUTIONS:follow hip precautions avoid bending greater than 90, avoid rotating hip/ foot inwards/ outwards, do not cross legs Other: hip revision, told not to cross legs  WEIGHT BEARING RESTRICTIONS: No  PAIN:  Are you having pain? Yes: NPRS scale: 0-5/10 Pain location: wrist, hand Pain description: aching Aggravating factors: wrist extension Relieving factors: rest  FALLS: Has patient fallen in last 6 months? Yes. Number of falls 3, Pt saw PT to address  LIVING ENVIRONMENT: Lives with: lives with their family and lives with their spouse Lives in: House/apartment  Has following equipment at home: Environmental Consultant - 4 wheeled and shower chair  PLOF: Independent with basic ADLs  PATIENT GOALS: improve grip and coordination, wants to play guitar better  OBJECTIVE:  Note: Objective measures were completed at Evaluation unless otherwise noted.  HAND DOMINANCE: Left  ADLs:  Overall ADLs:  increased time required Transfers/ambulation related to ADLs: Eating: difficulty holding knife to cut food  Grooming: mod I UB Dressing:needs assist with buttons at times LB Dressing: min A at times with socks Toileting: mod  Bathing: mod I Tub Shower transfers: mod I, walk in shower with seat Equipment: Shower seat with back and Walk in shower Has an up walker at home IADLs:  Light housekeeping: Pt does laundry, and cleans up after meals  Meal Prep: wife prepares Community mobility: mod I with rollator Medication management: keeps up with meds Financial management: keeps up with finanaces Handwriting: 50% legible and Moderate micrographia  MOBILITY STATUS: Independent  POSTURE COMMENTS:  rounded shoulders, forward head, and flexed trunk    FUNCTIONAL OUTCOME MEASURES: Fastening/unfastening 3 buttons: 1 min 29 secs Physical performance test: PPT#2 (simulated eating) 14.34 with 1 drop &  PPT#4 (donning/doffing jacket): NT  COORDINATION: 9 Hole Peg test: Right: 2 mins.06 sec multiple drops; Left: 43.83 sec Box and Blocks:  Right 40 blocks, Left 47blocks  UE ROM:  RUE shoulder flexion 120, elbow extension -20, LUE shoulder flexion 130, elbow extension -20, decreased bilateral grip/ composite flexion due to arthritis  COGNITION: Overall cognitive status: Within functional limits for tasks assessed  OBSERVATIONS: Bradykinesia                                                                                                                    TREATMENT DATE:  07/21/24 UBE x 6 mins for conditioning min v.c for speed. Seated PWR! moves for PWR! up, rock, and step, min v.c Stepping forwards and back at counter and marching with close supervision and min v.c Standing at counter, modified quadraped PWR! rock and step, mod v.c and min facilitation, Standing PWR! step, min v.c and close supervision.  Seated-Theraband exercises with yellow band for shoulder abduction, rowing and shoulder  extension, min v.c for positioning. Dynamic step and reach to place and remove items from targets, min v.c and supervision. Placing and removing screws from bolt board with tools, min-mod difficulty, min v.c  07/19/24 UBE x 6 mins for conditioning min v.c for speed. Supine, scapular retractiion, then chest press with cane and shoulder flexion, min v.c Seated PWR! moves for PWR! up, rock, and step, min v.c Standing at counter, modified quadraped PWR! rock and step, mod v.c and min facilitation, Standing PWR! step, min v.c and close supervision.  Theraband exercises with yellow band for shoulder abduction, rowing and shoulder extension, min mod v.c for positioning.   07/15/24 UBE x 6 mins for conditioning min v.c for speed. PWR! up and rock in seated mod v.c for amplitude. Tendon gliding exercises with RUE due to edema, min v.c followed by ice pack x 7 mins to RUE while perfroming activities with LUE PWR! hands for RUE for PWR! up rock and step followed by simulated eating with and without foam grip, mod v.c for techniques. Standing at wall for PWR! up, then arms in abdcution for improved posture. Discussed avoiding overhead weights at the gym as well as biceps curls, due to tight elbows and crunches and trunk ext due to hx of back surgery. Pt was cautioned against LE abduction/ adduction due to hx of hip surgery.   07/12/24- Pt arrived today s/p fall with swelling in R hand. Therapist discussed with pt/ wife recommendation that pt has his hand x-rayed as swelling still persists nearly 2 weeks after his accient.Pt/ wife verbalize understanding. PWR! rock and step at countertop, as well as marching, min v.c for amplitude. Pt was cued to avoid heavy weightbearing through RUE.  Stepping to left and right sides to flip playing cards, min-mod v.c for amplitude. Seated cane exercises for shoulder flexion and chest press, min v.c Fine motor coordination task with LUE to copy small peg design, mod  difficulty and increased time, min v.c for flicks. while ice pack  applied to R hand x approx 7 mins, no adverse reactions.     06/29/24-UBE x 6 mins for conditioning Standing with back against wall hands on rollator in front then with superivion/ min A from therapist standing with palms against wall for PWR! up while squeezing shoulder blades back, then arms in abduction on wall with supevision-min A, v.c and facilitation for upright posture PWR! rock at [employer], modified quadraped at [employer] 10-20 reps each Stepping forwards and backwards at countertop, rocking forwards and backwards for modified quaraped PWR! rock, marching at [employer], min-mod v.c these activities and min facilitlitation for amplitude and posture. Dynamic step and reach to flip playing cards, min-mod facillitation. PWR! hands basic 4 min v.c disccused safety for tying shoes, recommended elastic laces or slip on shoes due to hip prec. Placing grooved pegs into pegboard with RUE mod difficulty/ v.c for icnreased coordination   ed bilateral 9 hole peg test, see goals below.   PATIENT EDUCATION: Education details: see above Person educated: Patient Education method: Explanation, Demonstration, Tactile cues, Verbal cues,  Education comprehension: verbalized understanding, returned demonstration, verbal cues required,   HOME EXERCISE PROGRAM:  03/30/24-cane exercises in supine 04/02/24:  PWR! Hands, Coordination HEP with focus on large amplitude  04/09/24:  Strategies for eating and writing  GOALS: Goals reviewed with patient? Yes  SHORT TERM GOALS: Target date: 07/30/24   I with PD specific HEP Goal status: met, 04/29/24  2.  I with adapted strategies/AE to maximize safety and I with ADLs/IADLs. Goal status: ongoing  06/17/24  3.   Pt will demonstrate improved fine motor coordination for ADLs as evidenced by decreasing 9 hole peg test score for RUE to 2 mins or less without drops Revised goal: Pt will  demonstrate improved fine motor coordination for ADLs as evidenced by decreasing 9 hole peg test score for RUE to 1 min 15 secs with no more than 2 drops. Goal status:partially met 48 secs with drops  05/25/24,  not met 2 mins 22 secs- 1st trial several drops, 1 min 22 secs -06/14/24,  ongoing 06/17/24- 1 min 22 secs with several drops , continue with revised goal  4.   Pt will demonstrate improved fine motor coordination for ADLs as evidenced by decreasing 9 hole peg test score for LUE by 3 secs Baseline: LUE 43.83 secs Goal status:  met, 37.10 05/11/24  5.  Pt will demonstrate improved ease with feeding as evidenced by decreasing PPT#2 to 11.34 without drops Baseline:  Goal status: ongoing 04/06/24, 05/11/24- ongoing.  05/21/24 ongoing 16.31sec without drops, 05/25/24-15.33 secs  06/17/24 ongoing 15.81 07/15/24  16.  Pt will write a  sentence with 100% legibility and minimal decrease in letter size Goal status:  met 05/25/24 LONG TERM GOALS: Target date: 09/09/24  Pt will demonstrate improved ease with fastening buttons as evidenced by decreasing 3 button/ unbutton time to :1 min 20 secs or less Baseline: 1 min 29 secs Goal status: 05/21/24 MET 56.19sec  2.  Pt will demonstrate ability to retrieve a lightweight object at 125 shoulder flexion and -15 elbow extension with RUE Goal status:ongoing 115, -25 06/17/24  3. Pt will demonstrate ability to retrieve a lightweight object at 130 shoulder flexion and -15 elbow extension with LUE Goal status: met 130, -15  4.  Pt will verbalize understanding of ways to prevent future PD related complications and PD community resources. Goal status:ongoing 07/15/24  5.   Improve PPT#4 to 22 secs or less for increased ease with  dressing Baseline: 26.24 Goal status: ongoing 1 minute,  seated 06/17/24  6.  Pt will report dropping utensils. and cups less frequently Goal status:  ongoing, still dropping, 07/15/24   ASSESSMENT:  CLINICAL  IMPRESSION: Pt is progressing towards goals. He demonstrates improved posture today and reponds well to cueing for larger amplitude movements with functional activity.  PERFORMANCE DEFICITS: in functional skills including ADLs, IADLs, coordination, dexterity, ROM, strength, flexibility, Fine motor control, Gross motor control, mobility, balance, decreased knowledge of precautions, decreased knowledge of use of DME, and UE functional use, , and psychosocial skills including coping strategies, environmental adaptation, habits, interpersonal interactions, and routines and behaviors.   IMPAIRMENTS: are limiting patient from ADLs, IADLs, rest and sleep, play, leisure, and social participation.   COMORBIDITIES:  may have co-morbidities  that affects occupational performance. Patient will benefit from skilled OT to address above impairments and improve overall function.  MODIFICATION OR ASSISTANCE TO COMPLETE EVALUATION: No modification of tasks or assist necessary to complete an evaluation.  OT OCCUPATIONAL PROFILE AND HISTORY: Detailed assessment: Review of records and additional review of physical, cognitive, psychosocial history related to current functional performance.  CLINICAL DECISION MAKING: LOW - limited treatment options, no task modification necessary  REHAB POTENTIAL: Good  EVALUATION COMPLEXITY: Low   PLAN:  OT FREQUENCY: 2x/week  OT DURATION: 12 weeks- anticipate d/c after 8 weeks  PLANNED INTERVENTIONS: 97168 OT Re-evaluation, 97535 self care/ADL training, 02889 therapeutic exercise, 97530 therapeutic activity, 97112 neuromuscular re-education, 97140 manual therapy, 97035 ultrasound, 97018 paraffin, 02989 moist heat, 97010 cryotherapy, 97760 Orthotic Initial, 97763 Orthotic/Prosthetic subsequent, passive range of motion, balance training, functional mobility training, energy conservation, coping strategies training, patient/family education, and DME and/or AE  instructions  RECOMMENDED OTHER SERVICES: n/a  CONSULTED AND AGREED WITH PLAN OF CARE: Patient and family adult nurse- wife Rock GORY FOR NEXT SESSION:    check short term goals, donning/ doffing jacket, self feeding, preventing future complications  Hx of multiple hip dislocations, exercise caution with LE movement, twisting, no bending > 90*    Ruslan Mccabe, OTR/L  07/21/2024, 3:13 PM   "

## 2024-07-21 ENCOUNTER — Ambulatory Visit: Admitting: Occupational Therapy

## 2024-07-21 DIAGNOSIS — M25622 Stiffness of left elbow, not elsewhere classified: Secondary | ICD-10-CM

## 2024-07-21 DIAGNOSIS — M6281 Muscle weakness (generalized): Secondary | ICD-10-CM

## 2024-07-21 DIAGNOSIS — R29818 Other symptoms and signs involving the nervous system: Secondary | ICD-10-CM | POA: Diagnosis not present

## 2024-07-21 DIAGNOSIS — M25621 Stiffness of right elbow, not elsewhere classified: Secondary | ICD-10-CM

## 2024-07-21 DIAGNOSIS — R2681 Unsteadiness on feet: Secondary | ICD-10-CM

## 2024-07-21 DIAGNOSIS — R278 Other lack of coordination: Secondary | ICD-10-CM

## 2024-07-21 DIAGNOSIS — R2689 Other abnormalities of gait and mobility: Secondary | ICD-10-CM

## 2024-07-21 DIAGNOSIS — R293 Abnormal posture: Secondary | ICD-10-CM

## 2024-07-21 NOTE — Patient Instructions (Signed)
" ° ° ° ° ° °  Resisted Horizontal Abduction: Bilateral seated, keep arms lower than shoulder height   Sit otubing in both hands, arms out in front. Keeping arms straight, pinch shoulder blades together and stretch arms out. Repeat _10___ times per set. Do _1-2___ sessions per day, every other day.   Strengthening: Resisted Flexion  (Home) Retraction: Row - Bilateral (Anchor)    Facing anchor, arms reaching forward, pull hands toward stomach, pinching shoulder blades together. Repeat __10-15__ times per set. Do _1___ sets per session. Do __5__ sessions per week. Repeat with elbows straight.  Copyright  VHI. All rights reserved.    "

## 2024-07-26 ENCOUNTER — Ambulatory Visit

## 2024-07-26 ENCOUNTER — Ambulatory Visit: Admitting: Occupational Therapy

## 2024-07-27 ENCOUNTER — Ambulatory Visit: Admitting: Rheumatology

## 2024-07-27 DIAGNOSIS — G4733 Obstructive sleep apnea (adult) (pediatric): Secondary | ICD-10-CM

## 2024-07-27 DIAGNOSIS — Z8669 Personal history of other diseases of the nervous system and sense organs: Secondary | ICD-10-CM

## 2024-07-27 DIAGNOSIS — E559 Vitamin D deficiency, unspecified: Secondary | ICD-10-CM

## 2024-07-27 DIAGNOSIS — I10 Essential (primary) hypertension: Secondary | ICD-10-CM

## 2024-07-27 DIAGNOSIS — M5030 Other cervical disc degeneration, unspecified cervical region: Secondary | ICD-10-CM

## 2024-07-27 DIAGNOSIS — H16223 Keratoconjunctivitis sicca, not specified as Sjogren's, bilateral: Secondary | ICD-10-CM

## 2024-07-27 DIAGNOSIS — M19041 Primary osteoarthritis, right hand: Secondary | ICD-10-CM

## 2024-07-27 DIAGNOSIS — E538 Deficiency of other specified B group vitamins: Secondary | ICD-10-CM

## 2024-07-27 DIAGNOSIS — Z8639 Personal history of other endocrine, nutritional and metabolic disease: Secondary | ICD-10-CM

## 2024-07-27 DIAGNOSIS — M1811 Unilateral primary osteoarthritis of first carpometacarpal joint, right hand: Secondary | ICD-10-CM

## 2024-07-27 DIAGNOSIS — M47816 Spondylosis without myelopathy or radiculopathy, lumbar region: Secondary | ICD-10-CM

## 2024-07-27 DIAGNOSIS — M79641 Pain in right hand: Secondary | ICD-10-CM

## 2024-07-27 DIAGNOSIS — Z8719 Personal history of other diseases of the digestive system: Secondary | ICD-10-CM

## 2024-07-27 DIAGNOSIS — Z96642 Presence of left artificial hip joint: Secondary | ICD-10-CM

## 2024-07-28 NOTE — Progress Notes (Signed)
 " Jim SAVANT, RN  Patient arrived for their Return Visit and Botox appointment with Jim P MOORE, MD (referred by Self), and accompanied by spouse. Jim Carson  was identified via name and date of birth per protocol and taken to room 26 where the Movement intake was completed at 9:56 AM, 07/28/2024.   In active dialogue with patient reviewed chief complaint, completed a falls risk assessment for safety needs, reviewed allergies, did a complete medication reconciliation, reviewed pharmacy including location and contact numbers for further follow up.  The patients pain level was assessed and evaluated and appropriate education provided regarding the pain scale.  All questions and concerns addressed at this time regarding the patient's visit thus far.    Falls risk assessment completed for safety needs:  Assistance needed with ambulation?: Yes (Rollator) History of Falling in last 90  days?: Yes (a few times) Yellow armband placed?: Yes Wheelchair/assistance  provided?: Yes   Nursing staff assessed height and weight and obtained accurate vital signs.  Height:    Ht Readings from Last 3 Encounters:  04/27/24 172.7 cm (5' 8)  01/08/24 172.7 cm (5' 8)  11/19/23 172.7 cm (5' 8)   Weight:  Wt Readings from Last 3 Encounters:  07/28/24 88 kg (194 lb)  04/27/24 87.5 kg (193 lb)  01/08/24 87 kg (191 lb 14.4 oz)   Vitals:   07/28/24 1001 07/28/24 1006  Pulse: 55 58  BP: 115/61 (!) 144/57  BP Location: Left upper arm Left upper arm  Patient Position: Sitting Standing  BP Cuff Size: Adult Adult   Pulse oximetry of oxygen saturation was obtained:    The patient's pain level was assessed with the corresponding pain scale noted in the pain flowsheet as follows:   of the  ; Pain Score %%: 0-No pain/10  Any abnormal vital sign values are as follows: None.  and were reported to Jim P MOORE, MD.  Ambulatory aids used by patient are as follows: None.   Allergies were reviewed and  updated as needed and are listed below: Allergies  Allergen Reactions   Other Itching, Rash and Other (See Comments)    Dog hair, roaches, dust mites: very slight reaction: causes runny nose     A current and complete medication review was completed and is listed below: Current Outpatient Medications  Medication Instructions   amantadine  HCl (SYMMETREL ) 100 mg, Oral, 2 times Daily   carbidopa -levodopa  (SINEMET  CR) 25-100 mg CR tablet TAKE 1 TABLET BY MOUTH AT  BEDTIME   carbidopa -levodopa  (SINEMET ) 25-100 mg tablet TAKE 3 AND ONE-HALF TABLET BY  MOUTH 3 TIMES DAILY   cholecalciferol (VITAMIN D3) 5,000 Units, Daily   cyanocobalamin  (VITAMIN B12) 500 mcg, Daily   desonide (DESOWEN) 0.05 % ointment Daily PRN   docusate (COLACE) 100 mg   hydroCHLOROthiazide  (HYDRODIURIL ) 25 MG tablet 1 tablet, Daily   ipratropium (ATROVENT) 0.06 % nasal spray 2 sprays, Both Nares, 2 times Daily, Office visit needed before further refill.   levothyroxine  (SYNTHROID ) 75 mcg, Oral, Daily, Office visit needed before further refill.   linaCLOtide  (LINZESS ) 290 mcg, Daily PRN   losartan  (COZAAR ) 100 MG tablet 1 tablet, Daily   MELATONIN ORAL 15 mg, Nightly   omeprazole (PRILOSEC) 20 MG DR capsule Daily PRN   pantoprazole (PROTONIX) 40 mg, Oral, Daily   polyethylene glycol (MIRALAX ) 17 g, Daily   pramipexole  (MIRAPEX ) 0.5 mg, Oral, 2 times Daily     The preferred pharmacy was confirmed/updated and listed below:  [facility] - [place], [place] - [address] [facility] [address] [place] [address] [place] [address] Phone: [phone] Fax: [phone]  [facility] - [place], [place] - [address] [place] [address] Phone: [phone] Fax: [phone]  [id number] - [place], [place] - [address] [place] [address] Phone: [phone] Fax:  [phone]   Refill needs and personal or clinic paperwork was discussed.  All questions and concerns were addressed regarding the patient's visit thus far.       The patient was notified that clinic staff, in the effort to keep them safe, are escorting patients who are identified as Falls Risks to their car after appointments. Patient declined a falls prevention escort to their vehicle.   Armband verified per protocol. Jim Carson / caregiver verbally stated patients name and date of birth and confirmed, along with clinic staff, that information matches what is printed on armband. Armband located on patients right wrist. No orders of the defined types were placed in this encounter.         "

## 2024-07-28 NOTE — Progress Notes (Signed)
 " [facility]  Date: 07/28/2024 Patient Name: Jim Carson MRN: [id number] PCP: Ryan Powell HERO Referring Provider: Self  HISTORY   Jim Carson is a 74 y.o. 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 Jim Carson is accompanied by his wife. History is obtained from the patient, family members, and review of prior records.  To briefly review, he was diagnosed with PD in 2014 with symptom onset in 2012 with left hand tremor and small handwriting. He also noted decreased LUE arm swing. He has previously been seen by Dr. Deward Endo at [facility] and Dr. Arvella Robottom at [facility]. Most recently followed at [facility] by Dr. Dorthy PA. He was initially started on medication in 2014 with Azilect , followed by Mirapex  and Sinemet . His ROPAD is +GBA. Of note, he has h/o shoulder surgery with subsequent weakness in hands (decreased grip).  Of note, he has previously documented neuropathy on examination with h/o B12 deficiency on oral supplementation. He has had gait changes with steppage type gait and FOG c/w neuropathy and PD. He has been using an AFO on the RLE and a lift on LLE to prevent foot drop. He has reported R>L toe curling with some supination of the right foot during walking. This did not appear related to dopaminergic medications. It has responded to botulinum toxin injections. He has developed motor fluctuations with dyskinesia and FOG addressed to date with medication adjustments.    Prior medications related to present condition: Medication Reason for discontinuation  Azilect  unknown             When last seen on 04/27/24, he presented for chemodenervation. FOG ongoing.   Today, he reports the following   Fallen 3x since August, had head strike on concrete with concussion (change in consciousnes for a few hours); these are primarily from leaning on things that move e.g. car  door,chair  Difficutly getting out of chair, couldn't get off the floor Using the walker all  +FOG esp with turns and in the evening  No associated   They have also noted speech changes (low, garbled, tends to speak very quickly) Starting PT soon; doing OT, declines SLP; states he does he regularly does exercises  Some presence ahllucinations in the middl fo the night  No motor fluctuations, No dyskiensias   Parkinson Disease Quality Measurement Set  Annual Diagnostic Review: yes  Avoidance of dopamine blockers: yes  Motor symptoms: slower in activites  Psychiatric symptoms: The patient denied any current or previous mental health history including anxiety, depression, SI/HI, impulsivity, AH/VH and did not appear to be responding to internal stimuli.  Sleep: assessed; no concerns  Cognition: The patient denied having any cognitive complaints.     05/21/2018    4:00 PM 01/08/2024    9:00 AM  MOCA  Trails 1 1  Cube 0 0  Clock 2 3  Naming 3 3  Digit Span 2 2  Letter A 1 1  Serial 7s 2 3  Sentence Repetition 2 2  Fluency 1 1  Abstraction 1 1  Orientation 6 6  Memory 2 2  Education level 0 0  Total Score 23 25     Autonomic: auto sx: constipation (miralax , Linzess ); urinary frequency 2/2 BPH (no significant nocturia)  Falls: Fall Risk Screen today: Assistance needed with ambulation?: Yes (Rollator) History of Falling in last 90  days?: Yes (a few times) Yellow  armband placed?: Yes Wheelchair/assistance  provided?: Yes  Rehab therapies  Currently Receiving Recently Completed Referral Today Patient Declined Provider Deferred  PT       OT       ST        Last Benchmark Visit: 12/2023  Exercise: exercise: discussed the importance of aerobic exercise and resistance exercises and recommended 30 minutes of exercise at least 5 days a week  Motor Complications: No motor fluctuations. No dyskinesias or no impact by dyskinesias on activities or social  interactions.  Advance Care Planning  Advance Care Planning Discussion: ACP: deferred                Code Status: Not on file                  Appetite: good Wt Readings from Last 3 Encounters:  07/28/24 88 kg (194 lb)  04/27/24 87.5 kg (193 lb)  01/08/24 87 kg (191 lb 14.4 oz)     Current medications related to present condition:   7am 12:30pm 7pm 11pm  carbidopa /levodopa  25/100  3.5 tab 3.5 tab 3.5 tab    Sinemet  CR 25/100mg        1 tab  Pramipexole  0.5mg  1 tab 1 tab      Amantadine  100 mg 1 tab 1 tab         Time since last dose: 2.5 hours. Patient report they are in the on medication state.   He denied other medical issues or recent illness. I performed a complete ROS. Positive and pertinent negative responses are documented in the HPI, and all other systems are negative  PMH/PSH/FAM/SOC: reviewed, no change   Current Outpatient Medications:    amantadine  HCL (SYMMETREL ) 100 mg tablet, Take 1 tablet (100 mg total) by mouth 2 (two) times daily, Disp: 180 tablet, Rfl: 3   carbidopa -levodopa  (SINEMET  CR) 25-100 mg CR tablet, TAKE 1 TABLET BY MOUTH AT  BEDTIME, Disp: 90 tablet, Rfl: 3   carbidopa -levodopa  (SINEMET ) 25-100 mg tablet, TAKE 3 AND ONE-HALF TABLET BY  MOUTH 3 TIMES DAILY (Patient taking differently: Take 3.5 tablets by mouth 3 (three) times daily 0700, 1200, 1900), Disp: 945 tablet, Rfl: 3   cholecalciferol (VITAMIN D3) 5,000 unit capsule, Take 5,000 Units by mouth once daily. Reported on 10/31/2015 , Disp: , Rfl:    cyanocobalamin  (VITAMIN B12) 500 MCG tablet, Take 500 mcg by mouth once daily. Reported on 10/31/2015 , Disp: , Rfl:    desonide (DESOWEN) 0.05 % ointment, once daily as needed, Disp: , Rfl:    hydroCHLOROthiazide  (HYDRODIURIL ) 25 MG tablet, Take 1 tablet by mouth once daily, Disp: , Rfl:    ipratropium (ATROVENT) 0.06 % nasal spray, Place 2 sprays into both nostrils 2 (two) times daily Office visit needed before further refill., Disp: 15 mL,  Rfl: 0   levothyroxine  (SYNTHROID , LEVOTHROID) 75 MCG tablet, Take 1 tablet (75 mcg total) by mouth once daily Office visit needed before further refill., Disp: 90 tablet, Rfl: 0   linaCLOtide  (LINZESS ) 290 mcg capsule, Take 290 mcg by mouth once daily as needed, Disp: , Rfl:    losartan  (COZAAR ) 100 MG tablet, Take 1 tablet by mouth once daily, Disp: , Rfl:    MELATONIN ORAL, Take 15 mg by mouth at bedtime, Disp: , Rfl:    polyethylene glycol (MIRALAX ) packet, Take 17 g by mouth once daily, Disp: , Rfl:    pramipexole  (MIRAPEX ) 0.5 MG tablet, Take 1 tablet (0.5 mg total) by mouth 2 (  two) times daily, Disp: 120 tablet, Rfl: 0   docusate (COLACE) 100 MG capsule, Take 100 mg by mouth. (Patient not taking: Reported on 07/28/2024), Disp: , Rfl:    omeprazole (PRILOSEC) 20 MG DR capsule, once daily as needed (Patient not taking: Reported on 07/28/2024), Disp: , Rfl:    pantoprazole (PROTONIX) 40 MG DR tablet, Take 1 tablet (40 mg total) by mouth once daily (Patient not taking: Reported on 07/28/2024), Disp: 90 tablet, Rfl: 1  Current Facility-Administered Medications:    onabotulinumtoxinA (BOTOX) solution 275 Units, 275 Units, Intramuscular, As Directed, Georgina Lamarr SQUIBB, MD  PHYSICAL EXAM   Vitals:   07/28/24 1001 07/28/24 1006  BP: 115/61 (!) 144/57  Pulse: 55 58  Weight:  88 kg (194 lb)  PainSc: 0-No pain     General appearance - pleasant, appropriately dressed, groomed, and in no apparent distress. HEENT - Head Normocephalic, without obvious abnormality, atraumatic. Oropharynx moist, normal-appearing mucosa. Chest - normal work of breathing without audible wheeze or coughing Heart - regular rate and rhythm at the wrist, warm and well perfused Extremities - warm and well perfused and no cyanosis, clubbing or peripheral edema ; marked arthritis in the R>L hands/wrists     Neurological Examination:    Mental Status:: Alert and oriented to person, place, time. Memory and  concentration intact. Good fund of knowledge. No aphasia.   Cranial nerves: EOMI w/o nystagmus. Face symmetrical, hearing intact to conversation, shoulder shrug good bilaterally with reduced L excursion    Strength: Normal bulk.  Full strength throughout upper and lower extremities    Coordination: Finger-nose accurate without evidence of ataxia. No truncal ataxia.   Gait: Narrow based, unsteady, significantly stooped requiring bilateral support. Notable FOG. Pull test deferred.    Movement: No hypophonia, mild hypomimia No resting tremor, no kinetic and sustention tremor bilateral UEs  Finger taps, hand movements, rapid alternating movements, foot stomps intact with mild-moderate bradykinesia and interruptions bilaterally Tone normal throughout Facial dyskinesia with distraction No dystonia  No tics No chorea No myoclonus No milkmaid grip, no motor impersistence of the tongue  No apraxia    Diagnostic Studies and Review of Records:  Prior records reviewed and summarized in the HPI.  No results found for this or any previous visit.  Lab Results  Component Value Date   WBC 7.5 04/22/2017   HGB 14.6 04/22/2017   HCT 43.3 04/22/2017   MCV 88 04/22/2017   PLT 167 04/22/2017   Lab Results  Component Value Date   NA 140 04/22/2017   K 3.4 (L) 04/22/2017   CL 102 04/22/2017   CO2 30 04/22/2017   BUN 17 04/22/2017   CREATININE 0.9 04/22/2017   CALCIUM 9.8 04/22/2017   Lab Results  Component Value Date   ALKPHOS 48 03/03/2014   ALT 29 04/22/2017   AST 28 04/22/2017   No results found for: PT, INR, APTT Lab Results  Component Value Date   TSH 0.67 04/22/2017   FOLATE 12.6 04/22/2017   No results found for: COPPER, ZINC  ASSESSMENT AND PLAN  Diagnoses addressed today: (G20.A1) Parkinson's disease, unspecified whether dyskinesia present, unspecified whether manifestations fluctuate (CMS/HHS-HCC) - Plan: norflurane (PAIN EASE) topical spray 1  spray  (G24.9) Dystonia, unspecified - Plan: norflurane (PAIN EASE) topical spray 1 spray, onabotulinumtoxinA (BOTOX) solution 275 Units  (Z91.81) Falls infrequently  Jim Carson is a 74 y.o. male with a history including HTN, chronic lower back pain, hx of B12 deficiency, who presents with wife  for follow up evaluation of idiopathic Parkinson's disease in our interdisciplinary clinic. He is doing well but his disease is significantly complicated by his arthritis and orthopedic issues. However, he now has significant FOG as well. Agree with ongoing PT, OT and SLP. Encouraged exercise. Discussed FOG at length.    Medications and Orders Requested Prescriptions    No prescriptions requested or ordered in this encounter    No orders of the defined types were placed in this encounter.   Patient Instructions Patient Instructions  FREEZE BREAK TECHNIQUES  - At the first step of freeze: STOP- Take a deep breath- Take a large step - Shift all weight on to one foot to allow the other foot to take a large step - Count 1 and 2 and 3 and... and then step forward - Shift weight side to side, and then step forward - Imagine stepping over something on the ground and then actually step over it.  - Have someone place their foot on the ground in front of yours, for you to step over. - Place all of your weight on one leg while you swing the other leg back and forth a few times. On the last swing forward, take a step. - Try another movement (raise an arm, touch your head, point to the ceiling), then take a large step - Change direction: if you can't move forward, try stepping sideways and then go forward - Carry a laser pointer in your pocket, when you freeze shine the light in front of your front and step on the light - Try humming a song and time your re-start with the beat of the music - March in place a few times and then step forward  REMEMBER, DO NOT FIGHT THE FREEZE!  An easy way to  remember these tips is the 4 S's: 1. STOP- steady yourself and take a deep breath 2. STAND TALL 3. SHIFT- move your weight side to side 4. STEP- take a BIG step!     Follow Up: Return in about 3 months (around 10/26/2024) for Botox.  Time-based billing I spent a total of 55 minutes in both face-to-face and non-face-to-face activities for this visit on the date of this encounter.   08/10/19 E&M) This visit was coded based on time. I spent a total of 55 minutes in both face-to-face and non-face-to-face activities for this visit on the date of this encounter.    Choose Attestation:  Attestation Statement:   I personally performed the service. (TP)  KATHRYN SHAUNNA ADA, MD I personally performed the service. (TP)  Lamarr MYRTIS FREDRIK Ada, MD MSc Assistant Professor of Neurology 07/28/2024  [facility] [address] [place] [address] Phone [phone], fax [phone]  This note was generated with voice recognition software and an electronic medical record.  Please excuse grammatical, spelling and punctuation errors.  For patients and caregivers: You now have access to your medical records including your physician's documentation via [facility] My[facility]. Please be aware that your physician's notes are primarily designed to (1) record information pertinent to your care for the future reference of your physician and (2) act as inter-professional communication between members of your medical team. You should rely on the After Visit Summary (AVS) provided to you at the end of your visit for information about your visit and instructions regarding next steps. This is also available to you in [facility]. If you have clarifications or corrections you would like made to your records, please send a  message via MyChart. This will automatically be included in your medical record.  "

## 2024-07-28 NOTE — Procedures (Signed)
 Time out protocol was followed to verify the identity of the patient and the correct procedure being performed with the patients participation and agreement. Armband verified per protocol. Patient verbally stated patient's name and date of birth and confirmed, along with clinic staff, that information matches what is printed on armband. Patient signed prepared e-consent, which was electronically transferred to patient chart.     [facility] AMB TIME OUT       Field Entry User Date/Time   Time Out Occurred 07/28/2024 10:58 AM Jayson Hauser, RN 07/28/24 10:58   Correct Patient Yes Jayson Hauser, RN 07/28/24 10:58   Medications Reviewed Yes Jayson Hauser, RN 07/28/24 10:58   Allergies Reviewed Yes Jayson Hauser, RN 07/28/24 10:58   Correct Site Yes Jayson Hauser, RN 07/28/24 10:58   Site Marked No Jayson Hauser, RN 07/28/24 10:58   Correct Side Bilateral Jayson Hauser, RN 07/28/24 10:58   Correct Patient Position Yes Jayson Hauser, RN 07/28/24 10:58   Correct Procedure Yes Jayson Hauser, RN 07/28/24 10:58   What Procedure botox Jayson Hauser, RN 07/28/24 10:58   Correct Equipment/Supplies Yes Jayson Hauser, RN 07/28/24 10:58   Pre Procedure Medications Ordered/Given Yes Jayson Hauser, RN 07/28/24 10:58   Consent(s) Verified Yes Jayson Hauser, RN 07/28/24 10:58   Labs/Rad Studies Available Yes Jayson Hauser, RN 07/28/24 10:58   H&P Note Reviewed and Documented Yes Jayson Hauser, RN 07/28/24 10:58   Clinical Team Members See below... Jayson Hauser, RN 07/28/24 10:58    Value: Georgina, MD and Jayson, RN    Safety Precautions Reviewed Yes Jayson Hauser, RN 07/28/24 10:58   Is an electrosurgical unit, laser, or fiber-optic light cord being used? No Jayson Hauser, RN 07/28/24 10:58   [facility] AMB TIME OUT FIRE RISK Q1 SCORE [phone]) 0.00 Jayson Hauser, RN 07/28/24 10:58   Surgical Fire Risk Score 0.00 Jayson Hauser, RN 07/28/24 10:58    0.00 Jayson Hauser, RN 07/28/24 10:20

## 2024-07-29 ENCOUNTER — Ambulatory Visit: Admitting: Physical Therapy

## 2024-07-29 ENCOUNTER — Encounter: Payer: Self-pay | Admitting: Occupational Therapy

## 2024-07-29 ENCOUNTER — Encounter: Payer: Self-pay | Admitting: Physical Therapy

## 2024-07-29 ENCOUNTER — Ambulatory Visit: Admitting: Occupational Therapy

## 2024-07-29 DIAGNOSIS — R29818 Other symptoms and signs involving the nervous system: Secondary | ICD-10-CM | POA: Diagnosis not present

## 2024-07-29 DIAGNOSIS — R278 Other lack of coordination: Secondary | ICD-10-CM

## 2024-07-29 DIAGNOSIS — R2689 Other abnormalities of gait and mobility: Secondary | ICD-10-CM

## 2024-07-29 DIAGNOSIS — M6281 Muscle weakness (generalized): Secondary | ICD-10-CM

## 2024-07-29 DIAGNOSIS — R293 Abnormal posture: Secondary | ICD-10-CM

## 2024-07-29 DIAGNOSIS — R262 Difficulty in walking, not elsewhere classified: Secondary | ICD-10-CM

## 2024-07-29 DIAGNOSIS — M25622 Stiffness of left elbow, not elsewhere classified: Secondary | ICD-10-CM

## 2024-07-29 DIAGNOSIS — R2681 Unsteadiness on feet: Secondary | ICD-10-CM

## 2024-07-29 DIAGNOSIS — M25621 Stiffness of right elbow, not elsewhere classified: Secondary | ICD-10-CM

## 2024-07-29 NOTE — Therapy (Signed)
 " OUTPATIENT OCCUPATIONAL THERAPY PARKINSON'S TREATMENT  Patient Name: Jim Carson MRN: [id number] DOB:[birth year], 74 y.o., male Today's Date: 07/29/2024  PCP: Jim Slough, MD REFERRING PROVIDER: Georgina Collar MD  END OF SESSION:  OT End of Session - 07/29/24 1343     Visit Number 20    Number of Visits 36    Date for Recertification  09/09/24    Authorization Type Medicare, add KX    Authorization Time Period 12 weeks, anticipate d/c after 8 weeks    Authorization - Visit Number 20    Progress Note Due on Visit 20    OT Start Time 1315    OT Stop Time 1356    OT Time Calculation (min) 41 min    Activity Tolerance Patient tolerated treatment well    Behavior During Therapy [facility] for tasks assessed/performed                          Past Medical History:  Diagnosis Date   Abnormal gait    due to parkinson's,  uses cane   Absolute anemia 03/13/2023   Allergic rhinitis    Allergy [place] 1998   Allergic to dustmits, dog hair and roaches.   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 glendia  @[facility]   Pneumonia    Sleep apnea    Vitamin B12 deficiency    Vitamin D  deficiency    Wears glasses    Past Surgical History:  Procedure Laterality Date   APPENDECTOMY  07/1964   APPENDECTOMY     CHOLECYSTECTOMY     JOINT REPLACEMENT  March 2019 and again on 09/10/2021   Left hip twice.  2nd time due to 3 dislocations   LAPAROSCOPIC CHOLECYSTECTOMY  01-21-2017   @[facility] , [place]    lower back surgery      POSTERIOR CERVICAL FUSION/FORAMINOTOMY     POSTERIOR FUSION CERVICAL SPINE  08-15-2011   @[facility] , [place]   w/ laminectomy and decompression-- C3 -- T1   POSTERIOR LAMINECTOMY / DECOMPRESSION LUMBAR SPINE  07-30-2013   @[facility] , [place]   bilateral T11 - 12,  L2 --5   REMOVAL LEFT T1 PARASPINOUS MASS  02-12-2016   @[facility] ,  [place]   desmoid fibromatosis   SHOULDER ARTHROSCOPY WITH ROTATOR CUFF REPAIR Left 12/15/2018   Procedure: SHOULDER ARTHROSCOPY, biceps tenodesis labored Debridement, submacromial decompression;  Surgeon: Jim Lamar, MD;  Location: [facility];  Service: Orthopedics;  Laterality: Left;  interscalene block   SPINE SURGERY     TOTAL HIP ARTHROPLASTY Left 09/18/2017   @[facility] , [place]   TOTAL HIP REVISION Left 09/10/2021   Procedure: Left hip acetabular versus total hip arthroplasty revision, constrained liner;  Surgeon: Jim Lerner, MD;  Location: [facility];  Service: Orthopedics;  Laterality: Left;   TOTAL HIP REVISION Left 10/01/2023   Procedure: Left hip constrained liner revision;  Surgeon: Jim,  Dempsey, MD;  Location: [facility];  Service: Orthopedics;  Laterality: Left;    Patient Active Problem List   Diagnosis Date Noted   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   Thrombocytopenia 03/17/2023   Absolute anemia 03/13/2023   Family history of prostate cancer 03/13/2023   History of elevated PSA 03/13/2023   AV block, 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   Drug induced constipation 12/30/2022   Acquired hypothyroidism 12/30/2022   Primary osteoarthritis of left hip 05/18/2021   PD (Parkinson's disease) (HCC) 05/18/2021   History of gastroesophageal reflux (GERD) 05/18/2021   OSA (obstructive sleep apnea) 05/18/2021   Essential hypertension 05/18/2021    ONSET DATE: referral 12/09/23  REFERRING DIAG:  Diagnosis  G20.B2 (ICD-10-CM) - Parkinson's disease with dyskinesia, with  fluctuations    THERAPY DIAG:  Muscle weakness (generalized)  Abnormal posture  Other abnormalities of gait and mobility  Unsteadiness on feet  Stiffness of right elbow, not elsewhere classified  Stiffness of left elbow, not elsewhere classified  Other lack of coordination  Other symptoms and signs involving the nervous system  Difficulty in walking, not elsewhere classified  Rationale for Evaluation and Treatment: Rehabilitation  SUBJECTIVE:   SUBJECTIVE STATEMENT: Pt reports hand is a little better  Goes by Jim Carson  PERTINENT HISTORY: Pt reports he had a L hip replacement in 2019 and dislocated it 3 times. Re did it 2023, had a fall and jammed it and had to revise it 10/01/23. Was told not to go too heavy over 110 lbs on the leg press. Was told not to cross his legs  PMH: Parkinson's Disease, hypothyroidism, hx of multiple concussions, GERD, benign essential HTN, abnormal gait, L THA 2019, removal of T1 paraspinous mass 2017, T11-12 and L2-5 lumbar lami/decompression 2015, cervical fusion C3-T1 2013, hx of shoulder surgery June 2020, hip surgery revision 10/01/23 after multiple dislocations  PRECAUTIONS:follow hip precautions avoid bending greater than 90, avoid rotating hip/ foot inwards/ outwards, do not cross legs Other: hip revision, told not to cross legs  WEIGHT BEARING RESTRICTIONS: No  PAIN:  Are you having pain? Yes: NPRS scale: 0-5/10 Pain location: wrist, hand Pain description: aching Aggravating factors: wrist extension Relieving factors: rest  FALLS: Has patient fallen in last 6 months? Yes. Number of falls 3, Pt saw PT to address  LIVING ENVIRONMENT: Lives with: lives with their family and lives with their spouse Lives in: House/apartment  Has following equipment at home: Environmental Consultant - 4 wheeled and shower chair  PLOF: Independent with basic ADLs  PATIENT GOALS: improve grip and coordination, wants to play guitar better  OBJECTIVE:  Note: Objective  measures were completed at Evaluation unless otherwise noted.  HAND DOMINANCE: Left  ADLs:  Overall ADLs: increased time required Transfers/ambulation related to ADLs: Eating: difficulty holding knife to cut food  Grooming: mod I UB Dressing:needs assist with buttons at times LB Dressing: min A at times with socks Toileting: mod  Bathing: mod I Tub Shower transfers: mod I, walk in shower with seat Equipment: Shower seat with back and Walk in shower Has an up walker at home IADLs:  Light housekeeping: Pt does laundry, and cleans up after meals  Meal Prep: wife prepares Community mobility: mod I with rollator Medication management: keeps up with meds Financial management: keeps up with finanaces Handwriting: 50% legible and Moderate micrographia  MOBILITY STATUS: Independent  POSTURE COMMENTS:  rounded shoulders, forward head, and flexed trunk    FUNCTIONAL OUTCOME MEASURES: Fastening/unfastening  3 buttons: 1 min 29 secs Physical performance test: PPT#2 (simulated eating) 14.34 with 1 drop & PPT#4 (donning/doffing jacket): NT  COORDINATION: 9 Hole Peg test: Right: 2 mins.06 sec multiple drops; Left: 43.83 sec Box and Blocks:  Right 40 blocks, Left 47blocks  UE ROM:  RUE shoulder flexion 120, elbow extension -20, LUE shoulder flexion 130, elbow extension -20, decreased bilateral grip/ composite flexion due to arthritis  COGNITION: Overall cognitive status: Within functional limits for tasks assessed  OBSERVATIONS: Bradykinesia                                                                                                                    TREATMENT DATE:  1/29/26UBE x 6 mins for conditioning min v.c for speed. Seated PWR! moves for PWR! up, rock, and step, min v.c Standing at counter, modified quadraped PWR! rock  min v.c and close supervision,   functional reaching for upright posture, then dynamic step and reach to each side, close supervision, min-mod  v.c, close  supervision. Checked goals for PN- see goals below Sit to stand with PWR! up, mod v.c, discussed strategies for freezing  07/21/24 UBE x 6 mins for conditioning min v.c for speed. Seated PWR! moves for PWR! up, rock, and step, min v.c Stepping forwards and back at counter and marching with close supervision and min v.c Standing at counter, modified quadraped PWR! rock and step, mod v.c and min facilitation, Standing PWR! step, min v.c and close supervision.  Seated-Theraband exercises with yellow band for shoulder abduction, rowing and shoulder extension, min v.c for positioning. Dynamic step and reach to place and remove items from targets, min v.c and supervision. Placing and removing screws from bolt board with tools, min-mod difficulty, min v.c  07/19/24 UBE x 6 mins for conditioning min v.c for speed. Supine, scapular retractiion, then chest press with cane and shoulder flexion, min v.c Seated PWR! moves for PWR! up, rock, and step, min v.c Standing at counter, modified quadraped PWR! rock and step, mod v.c and min facilitation, Standing PWR! step, min v.c and close supervision.  Theraband exercises with yellow band for shoulder abduction, rowing and shoulder extension, min mod v.c for positioning.   07/15/24 UBE x 6 mins for conditioning min v.c for speed. PWR! up and rock in seated mod v.c for amplitude. Tendon gliding exercises with RUE due to edema, min v.c followed by ice pack x 7 mins to RUE while perfroming activities with LUE PWR! hands for RUE for PWR! up rock and step followed by simulated eating with and without foam grip, mod v.c for techniques. Standing at wall for PWR! up, then arms in abdcution for improved posture. Discussed avoiding overhead weights at the gym as well as biceps curls, due to tight elbows and crunches and trunk ext due to hx of back surgery. Pt was cautioned against LE abduction/ adduction due to hx of hip surgery.   07/12/24- Pt arrived today s/p fall  with swelling in R hand. Therapist discussed with pt/  wife recommendation that pt has his hand x-rayed as swelling still persists nearly 2 weeks after his accient.Pt/ wife verbalize understanding. PWR! rock and step at countertop, as well as marching, min v.c for amplitude. Pt was cued to avoid heavy weightbearing through RUE.  Stepping to left and right sides to flip playing cards, min-mod v.c for amplitude. Seated cane exercises for shoulder flexion and chest press, min v.c Fine motor coordination task with LUE to copy small peg design, mod difficulty and increased time, min v.c for flicks. while ice pack applied to R hand x approx 7 mins, no adverse reactions.     06/29/24-UBE x 6 mins for conditioning Standing with back against wall hands on rollator in front then with superivion/ min A from therapist standing with palms against wall for PWR! up while squeezing shoulder blades back, then arms in abduction on wall with supevision-min A, v.c and facilitation for upright posture PWR! rock at [employer], modified quadraped at [employer] 10-20 reps each Stepping forwards and backwards at countertop, rocking forwards and backwards for modified quaraped PWR! rock, marching at [employer], min-mod v.c these activities and min facilitlitation for amplitude and posture. Dynamic step and reach to flip playing cards, min-mod facillitation. PWR! hands basic 4 min v.c disccused safety for tying shoes, recommended elastic laces or slip on shoes due to hip prec. Placing grooved pegs into pegboard with RUE mod difficulty/ v.c for icnreased coordination   ed bilateral 9 hole peg test, see goals below.   PATIENT EDUCATION: Education details:  preventing future complications Person educated: Patient Education method: Explanation,  Verbal cues, handout Education comprehension: verbalized understanding,    HOME EXERCISE PROGRAM:  03/30/24-cane exercises in supine 04/02/24:  PWR! Hands, Coordination HEP with  focus on large amplitude  04/09/24:  Strategies for eating and writing  GOALS: Goals reviewed with patient? Yes  SHORT TERM GOALS: Target date: 07/30/24   I with PD specific HEP Goal status: met, 04/29/24  2.  I with adapted strategies/AE to maximize safety and I with ADLs/IADLs. Goal status: ongoing, foam grips issued, eduction performed for buttons and donning jacket 07/29/24  3.   Pt will demonstrate improved fine motor coordination for ADLs as evidenced by decreasing 9 hole peg test score for RUE to 2 mins or less without drops Revised goal: Pt will demonstrate improved fine motor coordination for ADLs as evidenced by decreasing 9 hole peg test score for RUE to 1 min 15 secs with no more than 2 drops. Goal status:partially met 48 secs with drops  05/25/24,  not met 2 mins 22 secs- 1st trial several drops, 1 min 22 secs -06/14/24,  ongoing 06/17/24- 1 min 22 secs with several drops , continue with revised goal, 1 min 51 secs- 07/29/24  4.   Pt will demonstrate improved fine motor coordination for ADLs as evidenced by decreasing 9 hole peg test score for LUE by 3 secs Baseline: LUE 43.83 secs Goal status:  met, 37.10 05/11/24  5.  Pt will demonstrate improved ease with feeding as evidenced by decreasing PPT#2 to 11.34 without drops Baseline:  Goal status: ongoing 04/06/24, 05/11/24- ongoing.  05/21/24 ongoing 16.31sec without drops, 05/25/24-15.33 secs  ongoing -15.81 07/15/24  6.  Pt will write a  sentence with 100% legibility and minimal decrease in letter size Goal status:  met 05/25/24 LONG TERM GOALS: Target date: 09/09/24  Pt will demonstrate improved ease with fastening buttons as evidenced by decreasing 3 button/ unbutton time to :1 min 20 secs  or less Baseline: 1 min 29 secs Goal status: 05/21/24 MET 56.19sec  2.  Pt will demonstrate ability to retrieve a lightweight object at 125 shoulder flexion and -15 elbow extension with RUE Goal status:ongoing 120, -15 partially  met, 07/29/24  3. Pt will demonstrate ability to retrieve a lightweight object at 130 shoulder flexion and -15 elbow extension with LUE Goal status: met 130, -15  4.  Pt will verbalize understanding of ways to prevent future PD related complications and PD community resources. Goal status:partially met preventing future complications issued 07/29/24  5.   Improve PPT#4 to 22 secs or less for increased ease with dressing Baseline: 26.24 Goal status: ongoing 1 minute,  seated 06/17/24  6.  Pt will report dropping utensils. and cups less frequently Goal status:  ongoing, improved but not fully achieved 07/29/24   ASSESSMENT:  CLINICAL IMPRESSION: For the reporting period of: 05/25/25- 07/29/24.Pt is progressing towards goals. He has achieved 3/6 short term goals and 2/6 long term goals. He is progressing towards remaining goals. He can benefit from continued skilled occupational therapy to address the performence deficits below in order to maximize pt's safety and I with ADLs/IADLs. PERFORMANCE DEFICITS: in functional skills including ADLs, IADLs, coordination, dexterity, ROM, strength, flexibility, Fine motor control, Gross motor control, mobility, balance, decreased knowledge of precautions, decreased knowledge of use of DME, and UE functional use, , and psychosocial skills including coping strategies, environmental adaptation, habits, interpersonal interactions, and routines and behaviors.   IMPAIRMENTS: are limiting patient from ADLs, IADLs, rest and sleep, play, leisure, and social participation.   COMORBIDITIES:  may have co-morbidities  that affects occupational performance. Patient will benefit from skilled OT to address above impairments and improve overall function.  MODIFICATION OR ASSISTANCE TO COMPLETE EVALUATION: No modification of tasks or assist necessary to complete an evaluation.  OT OCCUPATIONAL PROFILE AND HISTORY: Detailed assessment: Review of records and additional review  of physical, cognitive, psychosocial history related to current functional performance.  CLINICAL DECISION MAKING: LOW - limited treatment options, no task modification necessary  REHAB POTENTIAL: Good  EVALUATION COMPLEXITY: Low   PLAN:  OT FREQUENCY: 2x/week  OT DURATION: 12 weeks- anticipate d/c after 8 weeks  PLANNED INTERVENTIONS: 97168 OT Re-evaluation, 97535 self care/ADL training, 02889 therapeutic exercise, 97530 therapeutic activity, 97112 neuromuscular re-education, 97140 manual therapy, 97035 ultrasound, 97018 paraffin, 02989 moist heat, 97010 cryotherapy, 97760 Orthotic Initial, 97763 Orthotic/Prosthetic subsequent, passive range of motion, balance training, functional mobility training, energy conservation, coping strategies training, patient/family education, and DME and/or AE instructions  RECOMMENDED OTHER SERVICES: n/a  CONSULTED AND AGREED WITH PLAN OF CARE: Patient and family adult nurse- wife Rock GORY FOR NEXT SESSION:    check short term goals, donning/ doffing jacket, self feeding,   Hx of multiple hip dislocations, exercise caution with LE movement, twisting, no bending > 90*    Knut Rondinelli, OTR/L  07/29/2024, 3:34 PM   "

## 2024-07-29 NOTE — Patient Instructions (Addendum)
 Ways to prevent future Parkinson's related complications:  1.   Exercise regularly/daily. Challenge yourself SAFELY and try to get different types of exercise including: PWR! Moves, Cardio (cycling), strength and balance training, and stretches (Yoga)   2.   Focus on BIGGER movements during daily activities- really reach overhead, straighten elbows and extend fingers  3.   When dressing (especially jacket/coat) or reaching for your seatbelt make sure to use your body to assist by twisting while you reach and looking at where you are reaching - this can help to minimize stress on the shoulder and reduce the risk of a rotator cuff tear   5. Drink plenty of water and eat a high fiber diet (fresh fruits and veggies, nuts/seeds, beans, some fish). Avoid canned foods, reduce sugar intake and dairy when possible  6. Do NOT take your Parkinson's medication around meals (take at least 30 min before a meal, or 1 hour after a meal), especially protein as it blocks the absorption of the medicine and will not work effectively  7. Keep your feet apart when you are standing (wider stance) to allow you to have better balance and to reach further with your arms. Also make sure your feet are apart before standing up.   8. Stay social! - Go out with friends, play card games with others, join a community group or community PD exercise class  9. Get a good night's sleep! Staying active during the day and developing a good bedtime routine can help with this.   10. Communicate with your neurologist any concerns and maintain overall health - keep blood pressure regulated, reduce stress/anxiety, good nutrition/gut health, etc.

## 2024-07-29 NOTE — Therapy (Signed)
 " OUTPATIENT PHYSICAL THERAPY LOWER EXTREMITY TREATMENT    Patient Name: Jim Carson MRN: [id number] DOB:[birth year], 74 y.o., male Today's Date: 07/29/2024  END OF SESSION:  PT End of Session - 07/29/24 1241     Visit Number 2    Number of Visits 17    Date for Recertification  09/06/24    Authorization Type MCR/AARP    Progress Note Due on Visit 10    PT Start Time 1232    PT Stop Time 1314    PT Time Calculation (min) 42 min    Activity Tolerance Patient tolerated treatment well    Behavior During Therapy [facility] for tasks assessed/performed           Past Medical History:  Diagnosis Date   Abnormal gait    due to parkinson's,  uses cane   Absolute anemia 03/13/2023   Allergic rhinitis    Allergy [place] 1998   Allergic to dustmits, dog hair and roaches.   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 glendia  @[facility]urology   Pneumonia    Sleep apnea    Vitamin B12 deficiency    Vitamin D  deficiency    Wears glasses    Past Surgical History:  Procedure Laterality Date   APPENDECTOMY  07/1964   APPENDECTOMY     CHOLECYSTECTOMY     JOINT REPLACEMENT  March 2019 and again on 09/10/2021   Left hip twice.  2nd time due to 3 dislocations   LAPAROSCOPIC CHOLECYSTECTOMY  01-21-2017   @[facility] , [place]   lower back surgery      POSTERIOR CERVICAL FUSION/FORAMINOTOMY     POSTERIOR FUSION CERVICAL SPINE  08-15-2011   @[facility] , [place]   w/ laminectomy and decompression-- C3 -- T1   POSTERIOR LAMINECTOMY /  DECOMPRESSION LUMBAR SPINE  07-30-2013   @[facility] , [place]   bilateral T11 - 12,  L2 --5   REMOVAL LEFT T1 PARASPINOUS MASS  02-12-2016   @[facility] ,  [place]   desmoid fibromatosis   SHOULDER ARTHROSCOPY WITH ROTATOR CUFF REPAIR Left 12/15/2018   Procedure: SHOULDER ARTHROSCOPY, biceps tenodesis labored Debridement, submacromial decompression;  Surgeon: Gerome Lamar, MD;  Location: [facility];  Service: Orthopedics;  Laterality: Left;  interscalene block   SPINE SURGERY     TOTAL HIP ARTHROPLASTY Left 09/18/2017   @[facility] , [place]   TOTAL HIP REVISION Left 09/10/2021   Procedure: Left hip acetabular versus total hip arthroplasty revision, constrained liner;  Surgeon: Melodi Lerner, MD;  Location: [facility];  Service: Orthopedics;  Laterality: Left;   TOTAL HIP REVISION Left 10/01/2023   Procedure: Left hip constrained liner revision;  Surgeon: Melodi Lerner, MD;  Location: [facility];  Service: Orthopedics;  Laterality: Left;    Patient Active Problem List   Diagnosis Date Noted   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   Thrombocytopenia 03/17/2023   Absolute anemia 03/13/2023   Family history  of prostate cancer 03/13/2023   History of elevated PSA 03/13/2023   AV block, 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   Drug induced constipation 12/30/2022   Acquired hypothyroidism 12/30/2022   Primary osteoarthritis of left hip 05/18/2021   PD (Parkinson's disease) (HCC) 05/18/2021   History of gastroesophageal reflux (GERD) 05/18/2021   OSA (obstructive sleep apnea) 05/18/2021   Essential hypertension 05/18/2021    PCP: Abran Slough MD   REFERRING PROVIDER: Glendia Ann CROME, MD  REFERRING DIAG: Parkinsons G20.A1   THERAPY DIAG:  Muscle weakness (generalized)  Abnormal posture  Other abnormalities of gait and mobility  Unsteadiness on feet  Rationale for Evaluation and Treatment: Rehabilitation  ONSET DATE: chronic    SUBJECTIVE:   SUBJECTIVE STATEMENT:   Doing OK, no falls since last time. Have my ankle brace but its in another shoe. I need to strengthen my arms so that I can get down to the floor and push up in case I fall or want to get down to the floor to work on something. Hand is better, not as swollen. They said no fracture but its still sore.     EVAL: Had a fall at the end of December, was going into the garage at family's house and fell. I don't remember what happened but family said that I fell when I whipped around to shut the door. Don't remember a 2 hour window from when I fell, woke up in the ED. Had my hip surgery in 2019, it dislocated 3 times over the years and I had it again in 2023 and then I fell and jammed it and had to have another surgery on it April last year. Last time it dislocated was 2022. In terms of Parkinsons, its hard for me to walk without a walker and getting up can be difficult. If I step backwards, I can tend to stumble backward. Usually I fall forward if I'm going to fall. I'm getting stiffer and I need to do more stretches, I'm definitely getting weaker. Taking Levadopa, Amantadine , and Mirapex . [facility] but not taking any PD specific exercise courses. Was doing [employer] but need to sit for it now due to balance.   PERTINENT HISTORY: See above  PAIN:  Are you having pain? Yes: NPRS scale: 1/10 Pain location: R hand  Pain description: sore  Aggravating factors: sticking hand out to catch myself  Relieving factors: movement/exercise, heat   PRECAUTIONS: Fall and Other: hx of posterior THA with multiple dislocations, R ankle rolls easily    RED FLAGS: None   WEIGHT BEARING RESTRICTIONS: No  FALLS:  Has patient fallen in last 6 months? Yes. Number of falls 2; FOF (+)  LIVING ENVIRONMENT: Lives with: lives with their family Lives in: House/apartment   OCCUPATION: retired  PLOF: Independent, Independent with basic ADLs,  Independent with gait, and Independent with transfers  PATIENT GOALS: improve balance and strength, no more falls, be able to stretch more   NEXT MD VISIT: MD late January   OBJECTIVE:  Note: Objective measures were completed at Evaluation unless otherwise noted.    PATIENT SURVEYS:  PSFS: THE PATIENT SPECIFIC FUNCTIONAL SCALE  Place score of 0-10 (0 = unable to perform activity and 10 = able to perform activity at the same level as before injury or problem)  Activity Date: 07/12/24 eval     Walking around  8    2. Getting  dressed  8    3. Balance 5    4.      Total Score 7      Total Score = Sum of activity scores/number of activities  Minimally Detectable Change: 3 points (for single activity); 2 points (for average score)  [facility] (nd). The Patient Specific Functional Scale . Retrieved from [email or web address]   COGNITION: Overall cognitive status: Within functional limits for tasks assessed     SENSATION: Bottom of R foot is numb, L is OK   EDEMA:   R UE swollen, no significant bruising noted       LOWER EXTREMITY MMT:  MMT Right eval Left eval  Hip flexion 4+ 4+  Hip extension    Hip abduction 4- best seated  4- best seated   Hip adduction    Hip internal rotation    Hip external rotation    Knee flexion 4+ 4+  Knee extension 5 5  Ankle dorsiflexion    Ankle plantarflexion    Ankle inversion    Ankle eversion     (Blank rows = not tested)   FUNCTIONAL TESTS:  5 times sit to stand: 22.9 seconds, UEs on thighs and one posterior LOB back to chair  Timed up and go (TUG): 14.4 seconds 4WW  2 minute walk test: 236ft 4WW   GAIT: Distance walked: 247ft Assistive device utilized: [employer] - 4 wheeled Level of assistance: SBA Comments: flexed at hips, forward head, tends to push 4WW too far ahead of him, scissors when not cued, L toe catches quite a bit if he is not consciously  dorsiflexing ankle during gait cycle                                                                                                                                 TREATMENT DATE:   07/29/24  Nustep L4x8 minutes all four extremities seat 9 Gait with red TB for ankle DF boost and 4WW, forward and backward gait with cues to reduce scissoring and improve BOS   Seated PWR moves with 3# WaTe, cones, foam bolsters within safe ranges for hip STS with blue weighted ball OH press x10, ModA for anterior wt shift/hip extension  TB set up (red TB tied to shoe lace on tip of shoe + belt to gain DF/reduce foot drag) vs brace- TB set up is OK in a pinch but continues to strongly recommend foot up brace vs AFO to reduce fall risk and efficacy of movement Demonstrated difference between gait with severe foot drag and with assist from bands and bracing to improve gait/reduce fall risk      07/12/24  Eval, POC     PATIENT EDUCATION:  Education details: exam findings, POC, HEP, encouraged him to start wearing toe up brace for L foot again  Person educated: Patient Education method: Explanation Education comprehension: verbalized understanding and returned demonstration  HOME EXERCISE PROGRAM: TBD  ASSESSMENT:  CLINICAL IMPRESSION:   Arrives today doing OK, continues to have severe gait deviation due to foot drag, continued to encourage brace use and also discussed foot up brace as this could be more simple for him to use. Introduced sitting and standing PWR exercises today. Will continue to challenge him but will need to be very aware of overall severe fall risk as well as hx of hip dislocations (following posterior precautions).     EVAL: Patient is a 74 y.o. M who was seen today for physical therapy evaluation and treatment for skilled PT care for Parkinsons disease. Objectives as above- he is a very high fall risk and actually had a major fall with 2 hour LOC at the end of December.  Definitely with impaired safety awareness and insight into functional deficits. Will benefit from skilled PT services to address all findings, reduce fall risk, and assist in return to optimal level of function.   OBJECTIVE IMPAIRMENTS: Abnormal gait, decreased activity tolerance, decreased balance, decreased coordination, decreased knowledge of use of DME, decreased mobility, difficulty walking, decreased strength, decreased safety awareness, increased muscle spasms, impaired sensation, impaired UE functional use, postural dysfunction, and pain.   ACTIVITY LIMITATIONS: standing, squatting, stairs, transfers, bed mobility, and locomotion level  PARTICIPATION LIMITATIONS: meal prep, cleaning, laundry, shopping, community activity, yard work, and church  PERSONAL FACTORS: Age, Behavior pattern, Education, Fitness, Past/current experiences, Social background, and Time since onset of injury/illness/exacerbation are also affecting patient's functional outcome.   REHAB POTENTIAL: Good  CLINICAL DECISION MAKING: Stable/uncomplicated  EVALUATION COMPLEXITY: Low   GOALS: Goals reviewed with patient? No  SHORT TERM GOALS: Target date: 08/09/2024   Will be compliant with appropriate progressive HEP  Baseline: Goal status: ONGOING 07/29/24   2.  Will be compliant with brace wear L LE to improve gait pattern  Baseline:  Goal status: ONGOING 07/29/24  3.  Will be able to complete 5xSTS in 15 seconds or less no posterior LOB  Baseline:  Goal status: INITIAL    LONG TERM GOALS: Target date: 09/06/2024    MMT to be 5/5 all tested groups  Baseline:  Goal status: INITIAL  2.  Will ambulate at least 560ft in to show improved functional activity tolerance  Baseline:  Goal status: INITIAL  3.  Will score at least 48 on Berg to show reduced fall risk  Baseline:  Goal status: INITIAL  4.  Will be independent with Parkinsons based exercise programming in the gym vs group class settings in  order to facilitation independence in managing condition  Baseline:  Goal status: INITIAL  5.  PSFS to be at least 9 Baseline:  Goal status: INITIAL     PLAN:  PT FREQUENCY: 2x/week  PT DURATION: 8 weeks  PLANNED INTERVENTIONS: 97750- Physical Performance Testing, 97110-Therapeutic exercises, 97530- Therapeutic activity, [id number]- Neuromuscular re-education, 97535- Self Care, [id number]- Manual therapy, [id number]- Gait training, and [phone]- Aquatic Therapy  PLAN FOR NEXT SESSION: needs HEP; work on [employer] inspired movements primarily, also balance and general strength/coordination. Be mindful of R hand pain after fall as well as hx of multiple hip dislocations  Josette Rough, PT, DPT 07/29/24 1:15 PM  "

## 2024-08-02 ENCOUNTER — Ambulatory Visit: Admitting: Occupational Therapy

## 2024-08-02 ENCOUNTER — Ambulatory Visit

## 2024-08-04 ENCOUNTER — Ambulatory Visit: Admitting: Physical Therapy

## 2024-08-04 ENCOUNTER — Ambulatory Visit: Admitting: Occupational Therapy

## 2024-08-09 ENCOUNTER — Ambulatory Visit: Admitting: Occupational Therapy

## 2024-08-09 ENCOUNTER — Ambulatory Visit

## 2024-08-11 ENCOUNTER — Ambulatory Visit: Admitting: Physical Therapy

## 2024-08-11 ENCOUNTER — Ambulatory Visit: Admitting: Occupational Therapy

## 2024-12-14 ENCOUNTER — Ambulatory Visit: Admitting: Rheumatology
